# Patient Record
Sex: Male | Born: 1945 | Race: White | Hispanic: No | Smoking: Former smoker
Health system: Southern US, Community
[De-identification: ages and names within clinical notes are randomized; demographics above are authoritative.]

## PROBLEM LIST (undated history)

## (undated) DIAGNOSIS — N35919 Unspecified urethral stricture, male, unspecified site: Secondary | ICD-10-CM

## (undated) DIAGNOSIS — J439 Emphysema, unspecified: Secondary | ICD-10-CM

## (undated) DIAGNOSIS — I77810 Thoracic aortic ectasia: Secondary | ICD-10-CM

## (undated) DIAGNOSIS — N529 Male erectile dysfunction, unspecified: Secondary | ICD-10-CM

## (undated) DIAGNOSIS — N486 Induration penis plastica: Secondary | ICD-10-CM

## (undated) DIAGNOSIS — Z972 Presence of dental prosthetic device (complete) (partial): Secondary | ICD-10-CM

## (undated) DIAGNOSIS — R310 Gross hematuria: Secondary | ICD-10-CM

## (undated) DIAGNOSIS — Z86007 Personal history of in-situ neoplasm of skin: Secondary | ICD-10-CM

## (undated) DIAGNOSIS — K08109 Complete loss of teeth, unspecified cause, unspecified class: Secondary | ICD-10-CM

## (undated) DIAGNOSIS — M25562 Pain in left knee: Secondary | ICD-10-CM

## (undated) DIAGNOSIS — M545 Other chronic pain: Secondary | ICD-10-CM

## (undated) DIAGNOSIS — G8929 Other chronic pain: Secondary | ICD-10-CM

## (undated) DIAGNOSIS — Z973 Presence of spectacles and contact lenses: Secondary | ICD-10-CM

## (undated) DIAGNOSIS — R918 Other nonspecific abnormal finding of lung field: Secondary | ICD-10-CM

## (undated) DIAGNOSIS — I7121 Aneurysm of the ascending aorta, without rupture: Secondary | ICD-10-CM

## (undated) DIAGNOSIS — I712 Thoracic aortic aneurysm, without rupture: Secondary | ICD-10-CM

## (undated) DIAGNOSIS — N401 Enlarged prostate with lower urinary tract symptoms: Secondary | ICD-10-CM

## (undated) DIAGNOSIS — E291 Testicular hypofunction: Secondary | ICD-10-CM

## (undated) DIAGNOSIS — R3915 Urgency of urination: Secondary | ICD-10-CM

## (undated) DIAGNOSIS — M25561 Pain in right knee: Secondary | ICD-10-CM

## (undated) DIAGNOSIS — M1990 Unspecified osteoarthritis, unspecified site: Secondary | ICD-10-CM

## (undated) HISTORY — PX: TONSILLECTOMY: SUR1361

## (undated) HISTORY — DX: Thoracic aortic ectasia: I77.810

---

## 1986-12-18 HISTORY — PX: OTHER SURGICAL HISTORY: SHX169

## 2002-12-18 HISTORY — PX: SHOULDER ARTHROSCOPY WITH ROTATOR CUFF REPAIR AND SUBACROMIAL DECOMPRESSION: SHX5686

## 2003-02-09 ENCOUNTER — Ambulatory Visit (HOSPITAL_BASED_OUTPATIENT_CLINIC_OR_DEPARTMENT_OTHER): Admission: RE | Admit: 2003-02-09 | Discharge: 2003-02-09 | Payer: Self-pay | Admitting: Orthopedic Surgery

## 2005-12-14 ENCOUNTER — Ambulatory Visit: Payer: Self-pay | Admitting: Pulmonary Disease

## 2009-06-25 ENCOUNTER — Encounter: Payer: Self-pay | Admitting: Pulmonary Disease

## 2011-01-17 NOTE — Letter (Signed)
Summary: Chronic Neck Pain/[facility]  Chronic Neck Pain/[facility]   Imported By: Sherian Rein 07/14/2009 10:07:23  _____________________________________________________________________  External Attachment:    Type:   Image     Comment:   External Document

## 2011-12-09 ENCOUNTER — Ambulatory Visit (INDEPENDENT_AMBULATORY_CARE_PROVIDER_SITE_OTHER): Payer: 59

## 2011-12-09 DIAGNOSIS — R05 Cough: Secondary | ICD-10-CM

## 2011-12-09 DIAGNOSIS — R059 Cough, unspecified: Secondary | ICD-10-CM

## 2011-12-09 DIAGNOSIS — J159 Unspecified bacterial pneumonia: Secondary | ICD-10-CM

## 2011-12-09 DIAGNOSIS — R0602 Shortness of breath: Secondary | ICD-10-CM

## 2011-12-09 IMAGING — CR DG CHEST 2V
2 series · 2 of 2 positions shown · non-contrast
Comparison: none

[PA]
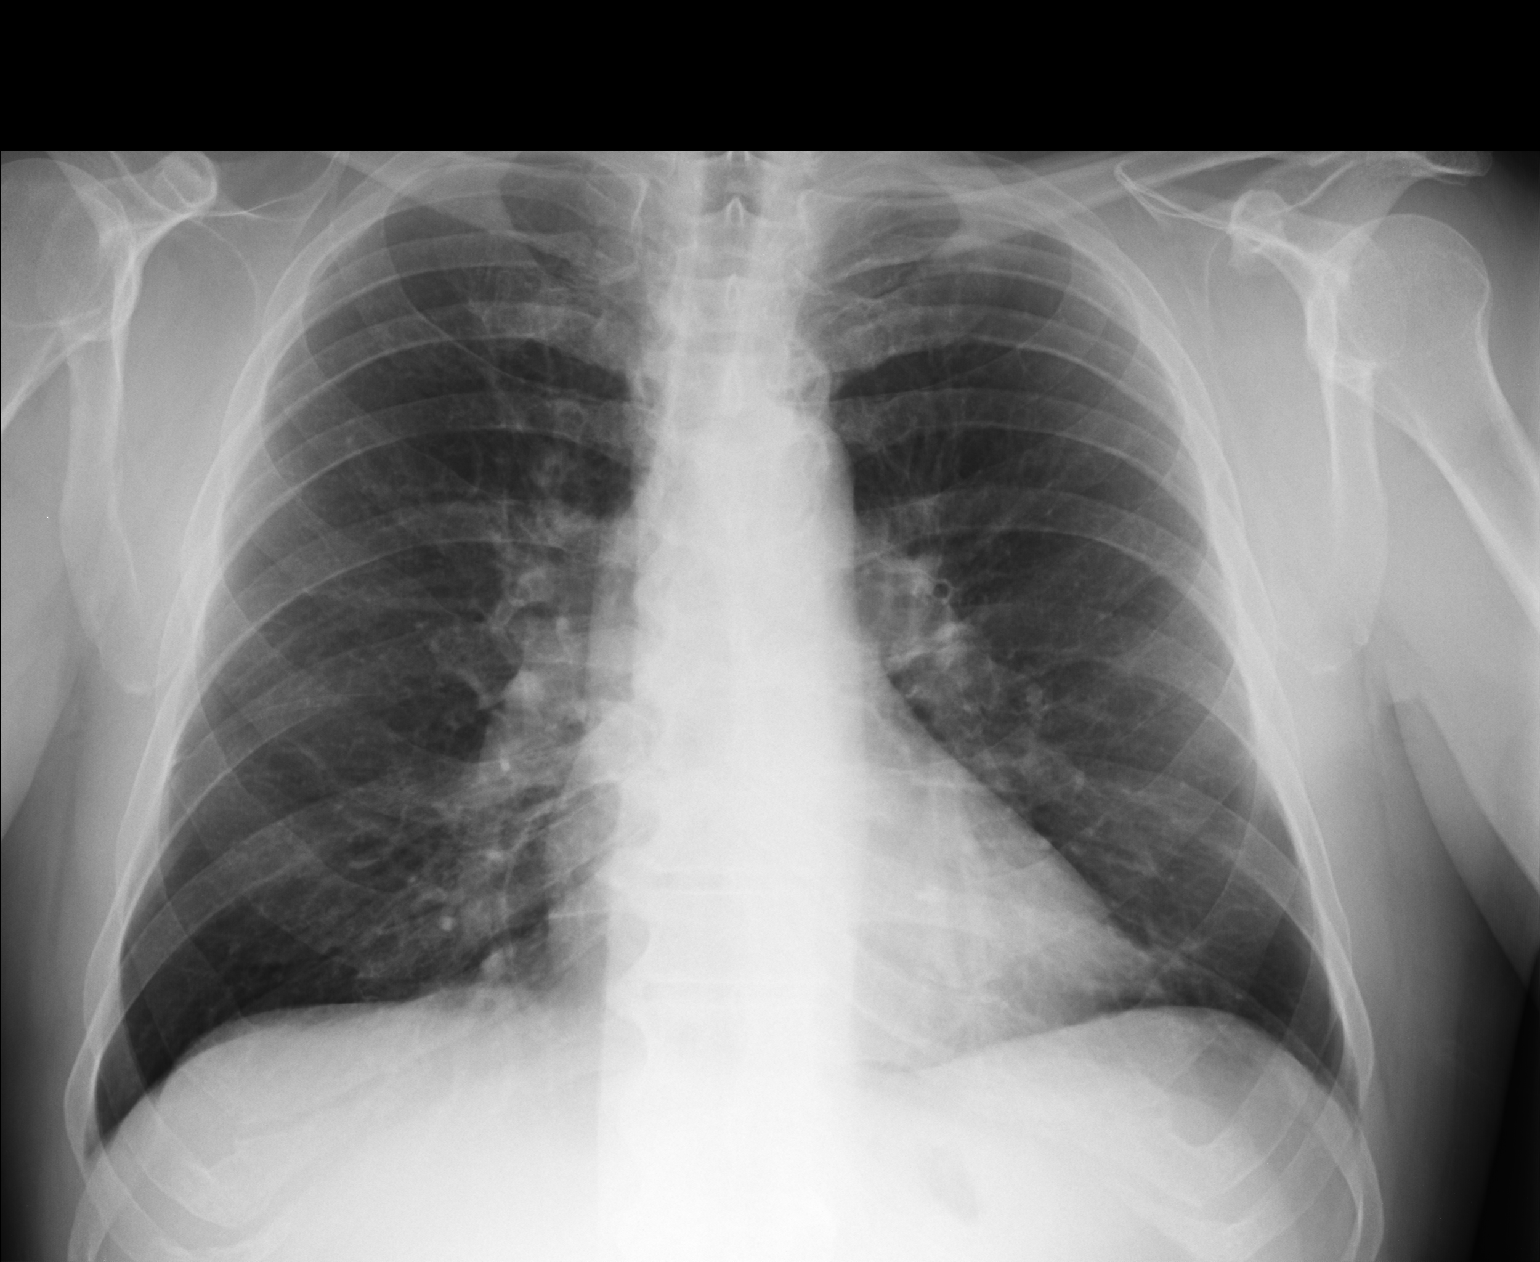

[lateral]
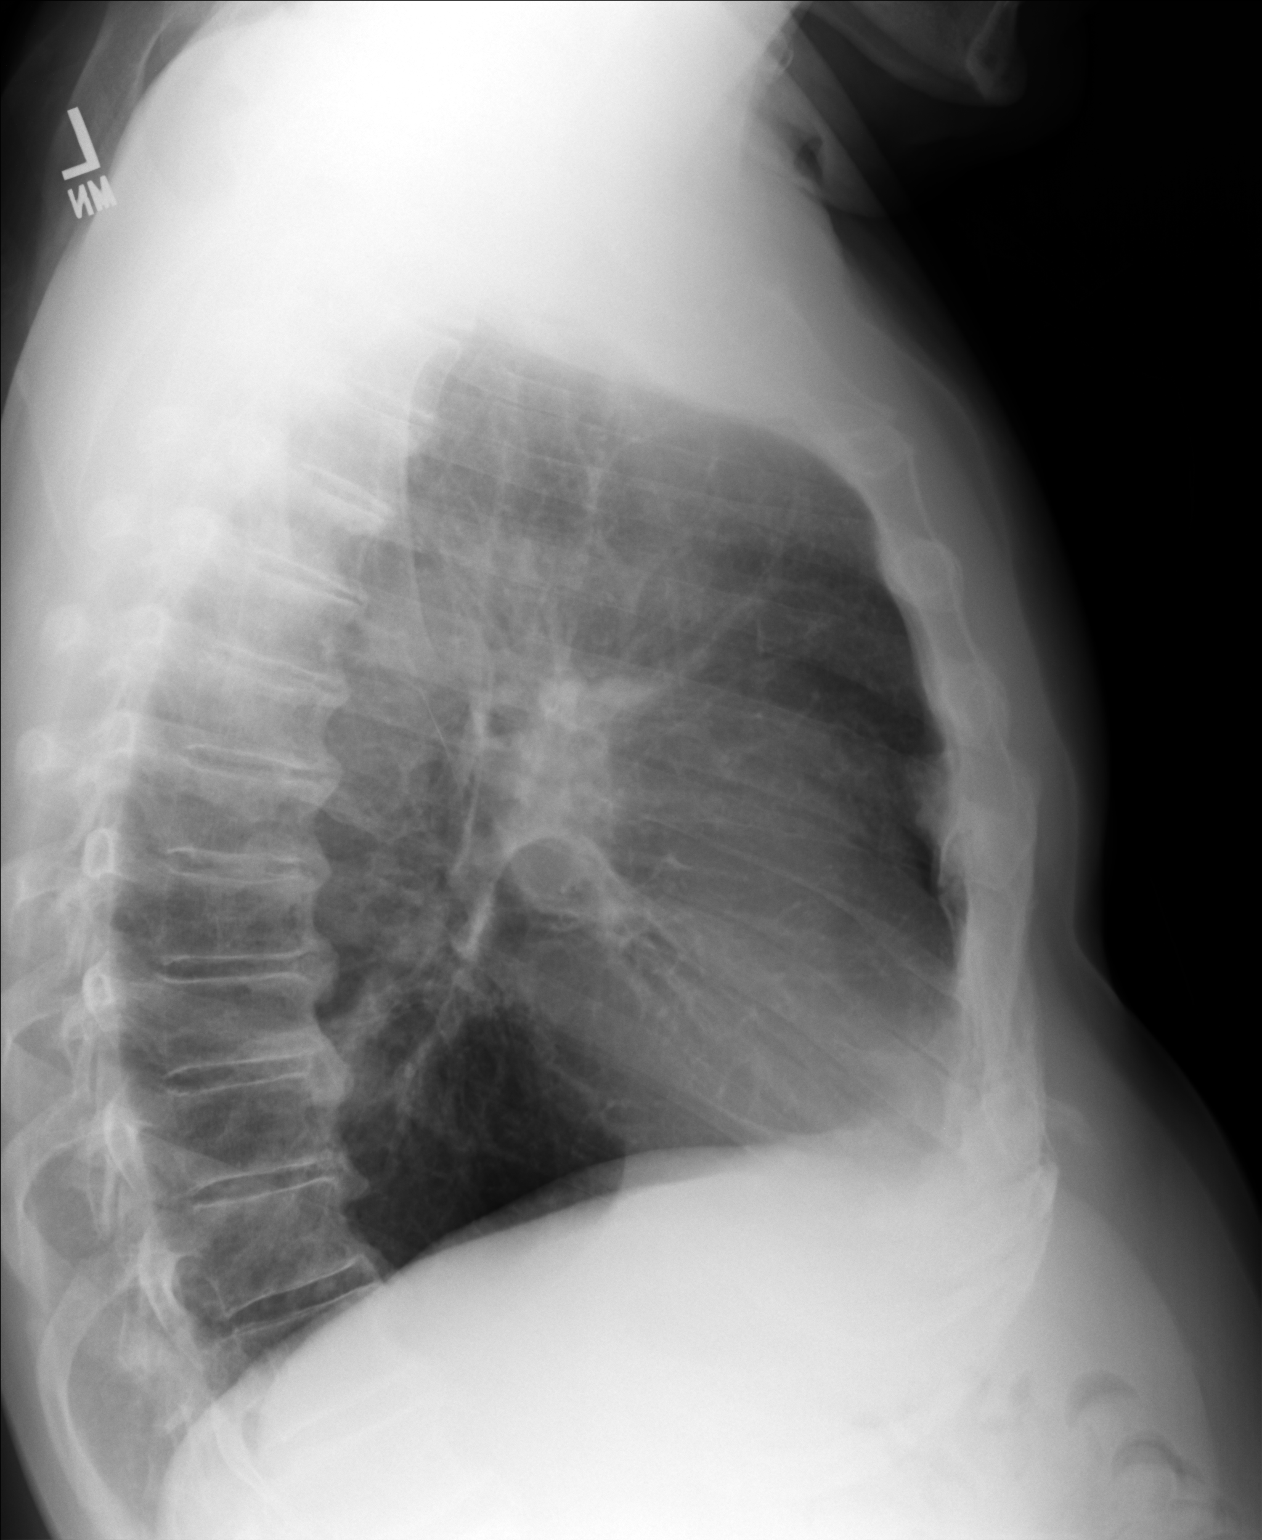

[2 of 2 positions shown; findings below may reference images not displayed]

Canned report from images found in remote index.

Refer to host system for actual result text.

## 2011-12-22 ENCOUNTER — Ambulatory Visit (INDEPENDENT_AMBULATORY_CARE_PROVIDER_SITE_OTHER): Payer: 59

## 2011-12-22 DIAGNOSIS — R0602 Shortness of breath: Secondary | ICD-10-CM

## 2011-12-22 DIAGNOSIS — J9801 Acute bronchospasm: Secondary | ICD-10-CM

## 2012-01-24 ENCOUNTER — Ambulatory Visit (INDEPENDENT_AMBULATORY_CARE_PROVIDER_SITE_OTHER): Payer: 59 | Admitting: Family Medicine

## 2012-01-24 VITALS — BP 148/78 | HR 99 | Temp 98.1°F | Resp 18 | Ht 66.5 in | Wt 183.0 lb

## 2012-01-24 DIAGNOSIS — R059 Cough, unspecified: Secondary | ICD-10-CM

## 2012-01-24 DIAGNOSIS — J449 Chronic obstructive pulmonary disease, unspecified: Secondary | ICD-10-CM

## 2012-01-24 DIAGNOSIS — R05 Cough: Secondary | ICD-10-CM

## 2012-01-24 LAB — POCT CBC
Granulocyte percent: 59.1 %G (ref 37–80)
HCT, POC: 46.5 % (ref 43.5–53.7)
Hemoglobin: 15 g/dL (ref 14.1–18.1)
Lymph, poc: 2.7 (ref 0.6–3.4)
MCH, POC: 31.3 pg — AB (ref 27–31.2)
MCHC: 32.3 g/dL (ref 31.8–35.4)
MCV: 97 fL (ref 80–97)
MID (cbc): 0.7 (ref 0–0.9)
MPV: 7.3 fL (ref 0–99.8)
POC Granulocyte: 4.9 (ref 2–6.9)
POC LYMPH PERCENT: 32.2 %L (ref 10–50)
POC MID %: 8.7 %M (ref 0–12)
Platelet Count, POC: 321 10*3/uL (ref 142–424)
RBC: 4.79 M/uL (ref 4.69–6.13)
RDW, POC: 14.3 %
WBC: 8.3 10*3/uL (ref 4.6–10.2)

## 2012-01-24 MED ORDER — ALBUTEROL SULFATE (2.5 MG/3ML) 0.083% IN NEBU
2.5000 mg | INHALATION_SOLUTION | Freq: Once | RESPIRATORY_TRACT | Status: AC
  Administered 2012-01-24: 2.5 mg via RESPIRATORY_TRACT

## 2012-01-24 MED ORDER — TIOTROPIUM BROMIDE MONOHYDRATE 18 MCG IN CAPS: 18.0000 ug | ORAL_CAPSULE | Freq: Every day | RESPIRATORY_TRACT | Status: DC

## 2012-01-24 MED ORDER — IPRATROPIUM BROMIDE 0.02 % IN SOLN
0.5000 mg | Freq: Once | RESPIRATORY_TRACT | Status: AC
  Administered 2012-01-24: 0.5 mg via RESPIRATORY_TRACT

## 2012-01-24 MED ORDER — PREDNISONE 20 MG PO TABS: ORAL_TABLET | ORAL | Status: DC

## 2012-01-24 MED ORDER — MOMETASONE FURO-FORMOTEROL FUM 200-5 MCG/ACT IN AERO: 2.0000 | INHALATION_SPRAY | Freq: Two times a day (BID) | RESPIRATORY_TRACT | Status: DC

## 2012-01-24 NOTE — Progress Notes (Signed)
  Subjective:    Patient ID: Kent Flowers, male    DOB: 01/19/1946, 66 y.o.   MRN: [id number]  HPI Kent Flowers presents today c/o worsening SOB.  He was seen 2 times 12/12 for COPD flare.  He never fully recovered to his baseline after the last treatment with prednisone, albuterol and qvar.  Kent Flowers mentions that prior to the December events he was very active and had good stamina but feels he's never returned to that level.  He becomes SOB when exerting himself but feels fine at rest.   Kent Flowers has had several flares in the past 10 years that were much more mild than his current symptoms but has never been told he has COPD.  He would use Primatene with some relief but was short lived. He last smoked 1 1/2 years ago and was an on/off smoker that smoked 1 ppd.    Review of Systems  Constitutional: Positive for chills and fatigue. Negative for fever.  HENT: Negative for congestion and sore throat.   Respiratory: Positive for cough, shortness of breath and wheezing. Negative for chest tightness.   Cardiovascular: Negative for chest pain and palpitations.  Musculoskeletal: Negative for myalgias.  Neurological: Positive for weakness.       Objective:   Physical Exam  Constitutional: He appears well-developed and well-nourished. No distress.  HENT:  Right Ear: Tympanic membrane normal.  Left Ear: Tympanic membrane normal.  Nose: Nose normal.  Mouth/Throat: Oropharynx is clear and moist.  Neck: No thyromegaly present.  Cardiovascular: Normal rate and regular rhythm.   Pulmonary/Chest: No accessory muscle usage or stridor. Not tachypneic. No respiratory distress. He has wheezes in the right lower field and the left lower field. He has rhonchi. He has no rales.  Skin: Skin is warm.    Albuterol/Atrovent Neb given with relief  Spirometry performed pre/post neb  Results for orders placed in visit on 01/24/12  POCT CBC      Component Value Range   WBC 8.3  4.6 - 10.2 (K/uL)   Lymph, poc 2.7  0.6 - 3.4    POC LYMPH PERCENT 32.2  10 - 50 (%L)   MID (cbc) 0.7  0 - 0.9    POC MID % 8.7  0 - 12 (%M)   POC Granulocyte 4.9  2 - 6.9    Granulocyte percent 59.1  37 - 80 (%G)   RBC 4.79  4.69 - 6.13 (M/uL)   Hemoglobin 15.0  14.1 - 18.1 (g/dL)   HCT, POC 40.9  81.1 - 53.7 (%)   MCV 97.0  80 - 97 (fL)   MCH, POC 31.3 (*) 27 - 31.2 (pg)   MCHC 32.3  31.8 - 35.4 (g/dL)   RDW, POC 91.4     Platelet Count, POC 321  142 - 424 (K/uL)   MPV 7.3  0 - 99.8 (fL)          Assessment & Plan:   1. COPD (chronic obstructive pulmonary disease)    2. Cough  POCT CBC, Comprehensive metabolic panel, albuterol (PROVENTIL) (2.5 MG/3ML) 0.083% nebulizer solution 2.5 mg, ipratropium (ATROVENT) nebulizer solution 0.5 mg, predniSONE (DELTASONE) 20 MG tablet    Start Prednisone today Dulera and Spiriva  Referral to Pulmonolgy Use Albuterol temporarily as rescue.  Return if SOB worsens Call tomorrow with status.

## 2012-01-24 NOTE — Patient Instructions (Signed)
Take Dulera 2 puffs every 12 hours Use Spiriva once a day Take prednisone as directed If you haven't heard from [place] regarding your pulmonology referral by Monday, please contact us.   Chronic Obstructive Pulmonary Disease Chronic obstructive pulmonary disease (COPD) is a condition in which airflow from the lungs is restricted. The lungs can never return to normal, but there are measures you can take which will improve them and make you feel better. CAUSES   Smoking.   Exposure to secondhand smoke.   Breathing in irritants (pollution, cigarette smoke, strong smells, aerosol sprays, paint fumes).   History of lung infections.  TREATMENT  Treatment focuses on making you comfortable (supportive care). Your caregiver may prescribe medications (inhaled or pills) to help improve your breathing. HOME CARE INSTRUCTIONS   If you smoke, stop smoking.   Avoid exposure to smoke, chemicals, and fumes that aggravate your breathing.   Take antibiotic medicines as directed by your caregiver.   Avoid medicines that dry up your system and slow down the elimination of secretions (antihistamines and cough syrups). This decreases respiratory capacity and may lead to infections.   Drink enough water and fluids to keep your urine clear or pale yellow. This loosens secretions.   Use humidifiers at home and at your bedside if they do not make breathing difficult.   Receive all protective vaccines your caregiver suggests, especially pneumococcal and influenza.   Use home oxygen as suggested.   Stay active. Exercise and physical activity will help maintain your ability to do things you want to do.   Eat a healthy diet.  SEEK MEDICAL CARE IF:   You develop pus-like mucus (sputum).   Breathing is more labored or exercise becomes difficult to do.   You are running out of the medicine you take for your breathing.  SEEK IMMEDIATE MEDICAL CARE IF:   You have a rapid heart rate.   You have agitation,  confusion, tremors, or are in a stupor (family members may need to observe this).   It becomes difficult to breathe.   You develop chest pain.   You have a fever.  MAKE SURE YOU:   Understand these instructions.   Will watch your condition.   Will get help right away if you are not doing well or get worse.  Document Released: 09/13/2005 Document Revised: 08/16/2011 Document Reviewed: 02/03/2011 [facility] Patient Information 2012 [place], [place].

## 2012-01-25 LAB — COMPREHENSIVE METABOLIC PANEL
ALT: 29 U/L (ref 0–53)
AST: 25 U/L (ref 0–37)
Albumin: 4.7 g/dL (ref 3.5–5.2)
Alkaline Phosphatase: 83 U/L (ref 39–117)
BUN: 24 mg/dL — ABNORMAL HIGH (ref 6–23)
CO2: 29 mEq/L (ref 19–32)
Calcium: 9.9 mg/dL (ref 8.4–10.5)
Chloride: 100 mEq/L (ref 96–112)
Creat: 1.12 mg/dL (ref 0.50–1.35)
Glucose, Bld: 96 mg/dL (ref 70–99)
Potassium: 4.5 mEq/L (ref 3.5–5.3)
Sodium: 139 mEq/L (ref 135–145)
Total Bilirubin: 0.5 mg/dL (ref 0.3–1.2)
Total Protein: 7.1 g/dL (ref 6.0–8.3)

## 2012-01-31 ENCOUNTER — Encounter: Payer: Self-pay | Admitting: Family Medicine

## 2012-02-01 NOTE — Progress Notes (Signed)
Quick Note:  Please let patient know cmet is normal. Thanks! KR  ______

## 2012-02-20 ENCOUNTER — Telehealth: Payer: Self-pay

## 2012-02-20 ENCOUNTER — Encounter: Payer: Self-pay | Admitting: Internal Medicine

## 2012-02-20 ENCOUNTER — Ambulatory Visit (INDEPENDENT_AMBULATORY_CARE_PROVIDER_SITE_OTHER): Payer: 59 | Admitting: Internal Medicine

## 2012-02-20 VITALS — BP 144/82 | HR 88 | Ht 66.5 in | Wt 194.2 lb

## 2012-02-20 DIAGNOSIS — R0989 Other specified symptoms and signs involving the circulatory and respiratory systems: Secondary | ICD-10-CM

## 2012-02-20 DIAGNOSIS — R0600 Dyspnea, unspecified: Secondary | ICD-10-CM

## 2012-02-20 DIAGNOSIS — R0609 Other forms of dyspnea: Secondary | ICD-10-CM

## 2012-02-20 DIAGNOSIS — J449 Chronic obstructive pulmonary disease, unspecified: Secondary | ICD-10-CM

## 2012-02-20 NOTE — Patient Instructions (Signed)
Continue Dulera   2 puffs, then rinse mouth, twice daily. Put it out on your sink and use it before brushing teeth every day.  Order- schedule PFT and 6 MWT    Dx dyspnea

## 2012-02-20 NOTE — Progress Notes (Signed)
02/20/12- [name] former smoker referred by Kennedy Bucker, PA-C/ [facility] because of COPD. Spirometry 01/24/12- FVC 2.24/ 65%, FEV1 1.20/ 42%, FEV1/FVC 0.53  He considers himself normally an active and athletic person. In December of 2012 he became aware of shortness of breath walking 100 yards. This is either new or not noticed previously. He had some prednisone left over from an orthopedic visit so he tried that with no effect. In January he went to urgent care where he was diagnosed with pneumonia and treated with Levaquin x10 days with no effect. He denies fever, cough or phlegm. He was given a repeat prednisone taper from 60 mg which seemed to help modestly. Prednisone caused weight gain and nervousness but did seem to make a big difference after a third round, which ended 3 weeks ago.Marland Kitchen He says he is now breathing "phenomenally better". He is still not back to normal exercise tolerance. He notices wheeze when he is short of breath with exertion. He was given Spiriva but decided not to try until he was seen here because he has a history of urinary outlet obstruction. He was also given a sample of Dulera but uncertain effect. For 4 or 5 years he has used a Primatene inhaler for morning shortness of breath occasionally. He did not find a rescue albuterol inhaler as effective. He doesn't know about past history of pneumonia but denies TB exposure. There is no family history of DVT or pulmonary embolism. Mother was a smoker who died at age 19 of COPD. He denies personal history of heart disease or symptoms of chest pain or palpitation. 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    ROS-see HPI Constitutional:   No-   weight loss, night sweats, fevers, chills, fatigue, lassitude. HEENT:   No-  headaches, difficulty swallowing, tooth/dental problems, sore throat,       No-  sneezing, itching, ear ache, nasal congestion, post nasal drip,  CV:  No-   chest  pain, orthopnea, PND, swelling in lower extremities, anasarca,  dizziness, palpitations Resp: +   shortness of breath with exertion or at rest.              No-   productive cough,  No non-productive cough,  No- coughing up of blood.              No-   change in color of mucus.  No- wheezing.   Skin: No-   rash or lesions. GI:  No-   heartburn, indigestion, abdominal pain, nausea, vomiting GU: No-   dysuria,. MS:  +  joint pain or swelling.  + decreased range of motion.  +- back pain. Neuro-     nothing unusual Psych:  No- change in mood or affect. No depression or anxiety.  No memory loss.  OBJ- Physical Exam General- Alert, Oriented, Affect-appropriate, Distress- none acute Skin- rash-none, lesions- none, excoriation- none Lymphadenopathy- none Head- atraumatic            Eyes- Gross vision intact, PERRLA, conjunctivae and secretions clear            Ears- Hearing, canals-normal            Nose- Clear, no-Septal dev, mucus, polyps, erosion, perforation             Throat- Mallampati II , mucosa clear , drainage- none, tonsils- atrophic Neck- flexible , trachea midline, no stridor , thyroid nl, carotid no bruit  Chest - symmetrical excursion , unlabored           Heart/CV- RRR , no murmur , no gallop  , no rub, nl s1 s2                           - JVD- none , edema- none, stasis changes- none, varices- none           Lung- clear to P&A, coarse wheeze R mid back, cough- none , dullness-none, rub- none           Chest wall-  Abd- tender-no, distended-no, bowel sounds-present, HSM- no Br/ Gen/ Rectal- Not done, not indicated Extrem- cyanosis- none, clubbing, none, atrophy- none, strength- nl. Neg [facility] Neuro- grossly intact to observation  Her

## 2012-02-20 NOTE — Telephone Encounter (Signed)
Xray reports from 12/09/11 and 12/22/11 faxed to Dr. Jetty Duhamel.

## 2012-02-20 NOTE — Telephone Encounter (Signed)
X ray report needed now  Dr. Maple Hudson is with the patient now Fax [phone]

## 2012-02-24 DIAGNOSIS — J449 Chronic obstructive pulmonary disease, unspecified: Secondary | ICD-10-CM | POA: Insufficient documentation

## 2012-02-24 DIAGNOSIS — J441 Chronic obstructive pulmonary disease with (acute) exacerbation: Secondary | ICD-10-CM | POA: Insufficient documentation

## 2012-02-24 NOTE — Assessment & Plan Note (Addendum)
I hear more wheeze in right mid back. Chest x-rays did not show lateralizing abnormality. This needs to be watched for possibility of an endobronchial partial obstruction. Consider need for CT scan in the future. Pneumococcal vaccine discussed. Formal PFT scheduled. Instructed use of Dulera.

## 2012-03-08 ENCOUNTER — Ambulatory Visit: Payer: 59

## 2012-03-19 ENCOUNTER — Ambulatory Visit (INDEPENDENT_AMBULATORY_CARE_PROVIDER_SITE_OTHER): Payer: 59 | Admitting: Internal Medicine

## 2012-03-19 DIAGNOSIS — R0989 Other specified symptoms and signs involving the circulatory and respiratory systems: Secondary | ICD-10-CM

## 2012-03-19 DIAGNOSIS — R0600 Dyspnea, unspecified: Secondary | ICD-10-CM

## 2012-03-19 DIAGNOSIS — R0609 Other forms of dyspnea: Secondary | ICD-10-CM

## 2012-03-19 DIAGNOSIS — J449 Chronic obstructive pulmonary disease, unspecified: Secondary | ICD-10-CM

## 2012-03-19 LAB — PULMONARY FUNCTION TEST

## 2012-03-19 NOTE — Progress Notes (Signed)
PFT done today. 

## 2012-03-22 ENCOUNTER — Ambulatory Visit: Payer: 59 | Admitting: Internal Medicine

## 2012-03-27 ENCOUNTER — Encounter: Payer: Self-pay | Admitting: Internal Medicine

## 2012-03-27 NOTE — Progress Notes (Signed)
Documentation for 6 minute walk test 

## 2012-03-29 ENCOUNTER — Encounter: Payer: Self-pay | Admitting: Internal Medicine

## 2012-03-29 ENCOUNTER — Ambulatory Visit (INDEPENDENT_AMBULATORY_CARE_PROVIDER_SITE_OTHER): Payer: 59 | Admitting: Internal Medicine

## 2012-03-29 VITALS — BP 136/86 | HR 76 | Ht 67.0 in | Wt 186.4 lb

## 2012-03-29 DIAGNOSIS — R0609 Other forms of dyspnea: Secondary | ICD-10-CM

## 2012-03-29 DIAGNOSIS — R0989 Other specified symptoms and signs involving the circulatory and respiratory systems: Secondary | ICD-10-CM

## 2012-03-29 DIAGNOSIS — J449 Chronic obstructive pulmonary disease, unspecified: Secondary | ICD-10-CM

## 2012-03-29 DIAGNOSIS — R0600 Dyspnea, unspecified: Secondary | ICD-10-CM

## 2012-03-29 NOTE — Progress Notes (Signed)
02/20/12- [name] former smoker referred by Kent Bucker, PA-C/ [facility] because of COPD. Spirometry 01/24/12- FVC 2.24/ 64%, FEV1 1.20/ 42%, FEV1/FVC 0.53  He considers himself normally an active and athletic person. In December of 2012 he became aware of shortness of breath walking 100 yards. This is either new or not noticed previously. He had some prednisone left over from an orthopedic visit so he tried that with no effect. In January he went to urgent care where he was diagnosed with pneumonia and treated with Levaquin x10 days with no effect. He denies fever, cough or phlegm. He was given a repeat prednisone taper from 60 mg which seemed to help modestly. Prednisone caused weight gain and nervousness but did seem to make a big difference after a third round, which ended 3 weeks ago.Marland Kitchen He says he is now breathing "phenomenally better". He is still not back to normal exercise tolerance. He notices wheeze when he is short of breath with exertion. He was given Spiriva but decided not to try until he was seen here because he has a history of urinary outlet obstruction. He was also given a sample of Dulera but uncertain effect. For 4 or 5 years he has used a Primatene inhaler for morning shortness of breath occasionally. He did not find a rescue albuterol inhaler as effective. He doesn't know about past history of pneumonia but denies TB exposure. There is no family history of DVT or pulmonary embolism. Mother was a smoker who died at age 66 of COPD. He denies personal history of heart disease or symptoms of chest pain or palpitation. 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    03/29/12-  [name] former smoker referred by Kent Bucker, PA-C/ [facility] because of COPD.  Pt states breathing has improved but not back at baseline yet.  Reports he does have DOE and prod cough with white phelgm mostly in the mornings. He hopes to get back to the exercise  tolerance he remembers from last fall when he says work included lifting 70 pound stones occasionally. He ran out of [facility], got worse and restarted it. PFT 03/19/2012 moderate obstructive airways disease with insignificant response to bronchodilator. FEV1/FVC 0.56. Air-trapping. Normal diffusion. 6 minute walk test 92%, 95%, 95%, 506 m. Good distance without oxygen limitation.    ROS-see HPI Constitutional:   No-   weight loss, night sweats, fevers, chills, fatigue, lassitude. HEENT:   No-  headaches, difficulty swallowing, tooth/dental problems, sore throat,       No-  sneezing, itching, ear ache, nasal congestion, post nasal drip,  CV:  No-   chest pain, orthopnea, PND, swelling in lower extremities, anasarca,  dizziness, palpitations Resp: +   shortness of breath with exertion or at rest.              No-   productive cough,  No non-productive cough,  No- coughing up of blood.              No-   change in color of mucus.  No- wheezing.   Skin: No-   rash or lesions. GI:  No-   heartburn, indigestion, abdominal pain, nausea, vomiting GU: No-   dysuria,. MS:  +  joint pain or swelling.  + decreased range of motion.  +- back pain. Neuro-     nothing unusual Psych:  No- change in mood or affect. No depression or anxiety.  No memory  loss.  OBJ- Physical Exam General- Alert, Oriented, Affect-appropriate, Distress- none acute, looks fit Skin- rash-none, lesions- none, excoriation- none. Sunburned Lymphadenopathy- none Head- atraumatic            Eyes- Gross vision intact, PERRLA, conjunctivae and secretions clear            Ears- Hearing, canals-normal            Nose- Clear, no-Septal dev, mucus, polyps, erosion, perforation             Throat- Mallampati II , mucosa clear , drainage- none, tonsils- atrophic Neck- flexible , trachea midline, no stridor , thyroid nl, carotid no bruit Chest - symmetrical excursion , unlabored           Heart/CV- RRR , no murmur , no gallop  , no rub, nl s1  s2                           - JVD- none , edema- none, stasis changes- none, varices- none           Lung- diminished, mild bilateral wheeze, cough- none , dullness-none, rub- none, raspy laughter            Chest wall-  Abd-  Br/ Gen/ Rectal- Not done, not indicated Extrem- cyanosis- none, clubbing, none, atrophy- none, strength- nl.  Neuro- grossly intact to observation  Her

## 2012-03-29 NOTE — Patient Instructions (Signed)
Order- CT angiogram chest   Dyspnea, question PE  Order EKG   Dx dspnea  Ok to try your spiriva, once daily, in addition to your [facility]

## 2012-04-04 NOTE — Assessment & Plan Note (Signed)
Moderate COPD. I can't tell what would have changed if he is correct that there was a drop off in exercise tolerance last fall. 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 Okay to experiment off of Dulera. Plan CT chest, EKG, try Spiriva.

## 2012-04-09 ENCOUNTER — Inpatient Hospital Stay: Admission: RE | Admit: 2012-04-09 | Payer: 59 | Source: Ambulatory Visit

## 2012-04-12 ENCOUNTER — Inpatient Hospital Stay: Admission: RE | Admit: 2012-04-12 | Payer: 59 | Source: Ambulatory Visit

## 2012-04-23 ENCOUNTER — Ambulatory Visit (INDEPENDENT_AMBULATORY_CARE_PROVIDER_SITE_OTHER)
Admission: RE | Admit: 2012-04-23 | Discharge: 2012-04-23 | Disposition: A | Discharge status: Home / Self Care | Payer: 59 | Source: Ambulatory Visit | Attending: Internal Medicine | Admitting: Internal Medicine

## 2012-04-23 DIAGNOSIS — R0609 Other forms of dyspnea: Secondary | ICD-10-CM

## 2012-04-23 DIAGNOSIS — R0600 Dyspnea, unspecified: Secondary | ICD-10-CM

## 2012-04-23 DIAGNOSIS — R0989 Other specified symptoms and signs involving the circulatory and respiratory systems: Secondary | ICD-10-CM

## 2012-04-23 IMAGING — CT CT ANGIO CHEST
2 of 6 series · 19 of 36 positions shown · IV contrast (Omnipaque 300)
Comparison: None

CLINICAL DATA: Question pulmonary embolism

CT ANGIOGRAPHY CHEST
TECHNIQUE: Multidetector CT imaging of the chest using the
standard protocol during bolus administration of intravenous
contrast. Multiplanar reconstructed images including MIPs were
obtained and reviewed to evaluate the vascular anatomy.
Contrast: 80mL OMNIPAQUE IOHEXOL 300 MG/ML  SOLN

[Series 5: thins (id) / (id) · axial · 0.75mm/px · z∈[-253,-27]mm · 18 of 252 slices shown]
[im 13/252  lung]
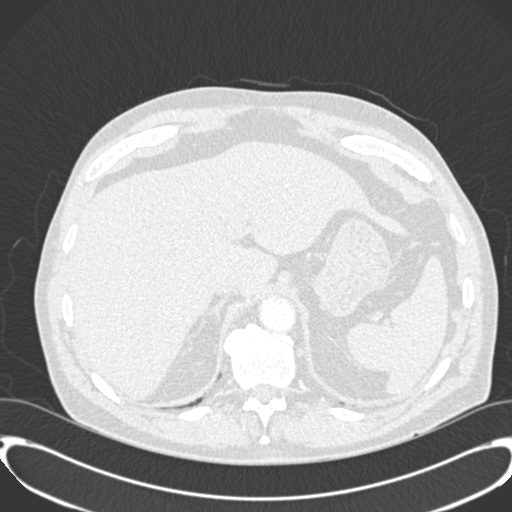
[im 26/252  mediastinal]
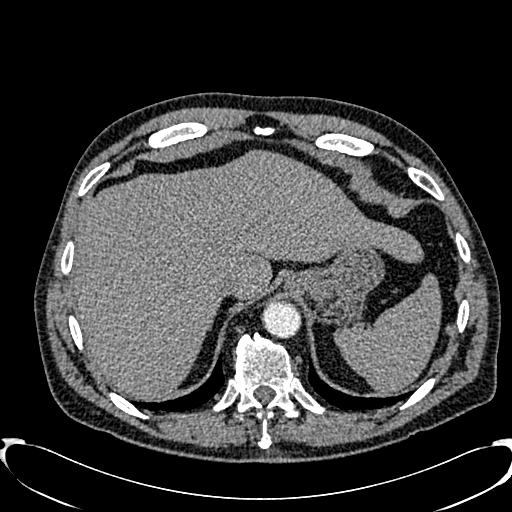
[im 38/252  lung]
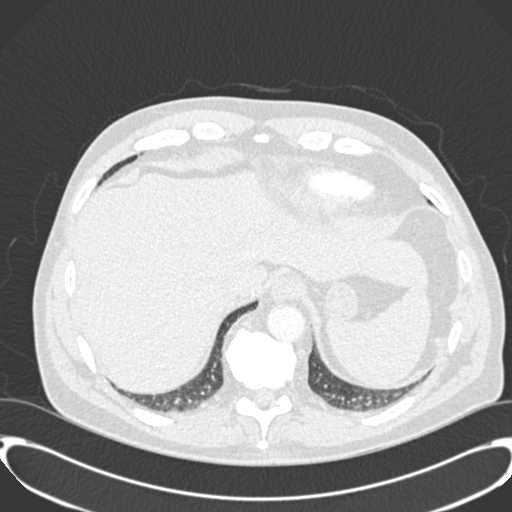
[im 51/252  mediastinal]
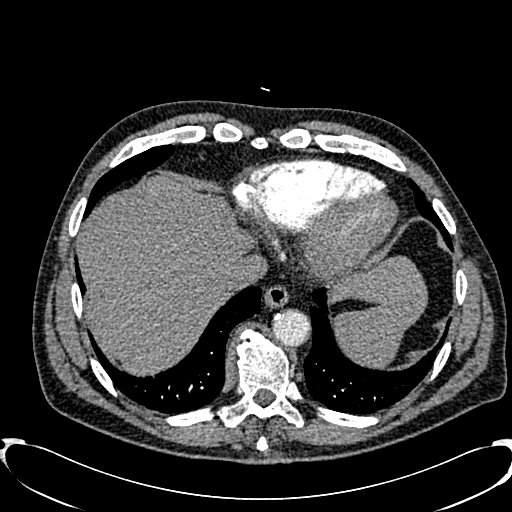
[im 63/252  lung]
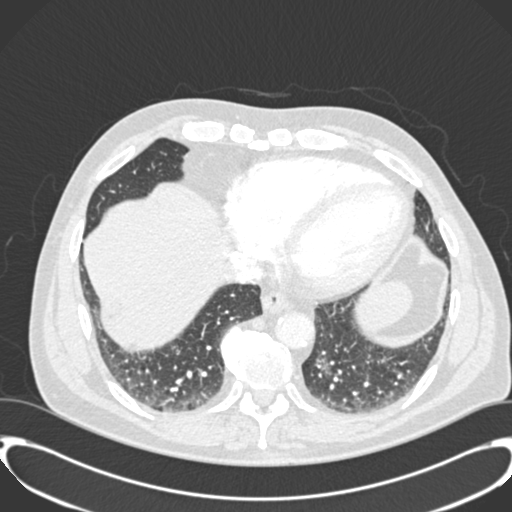
[im 76/252  mediastinal]
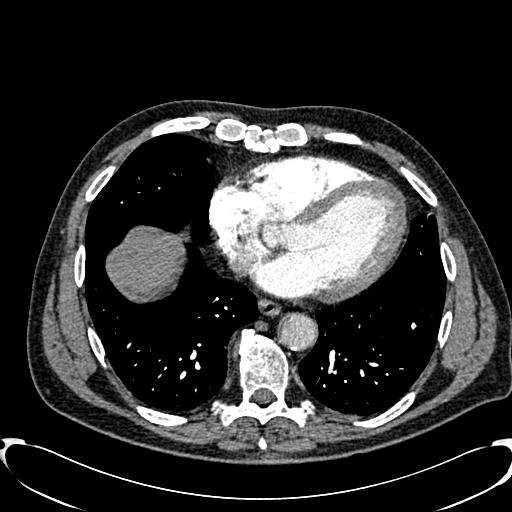
[im 88/252  lung]
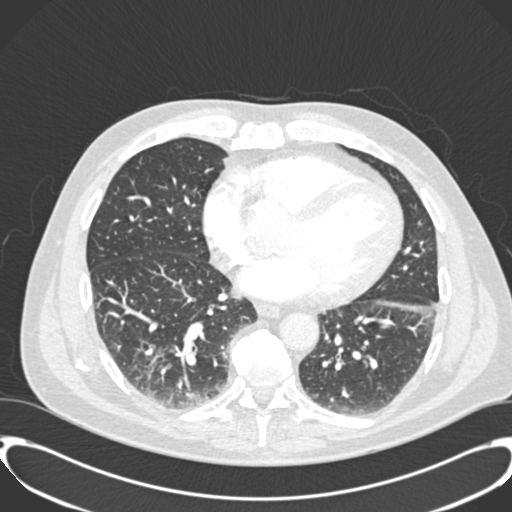
[im 101/252  mediastinal]
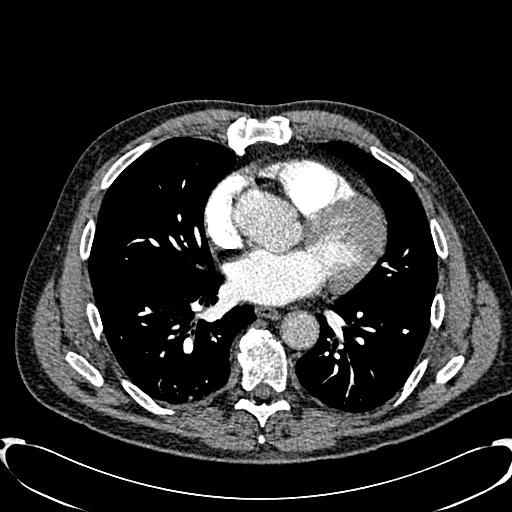
[im 113/252  lung]
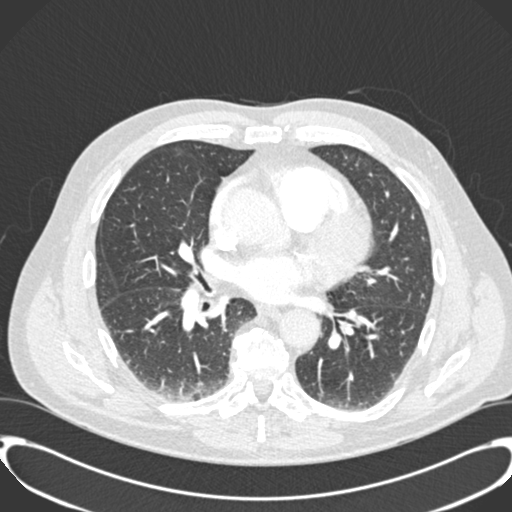
[im 139/252  mediastinal]
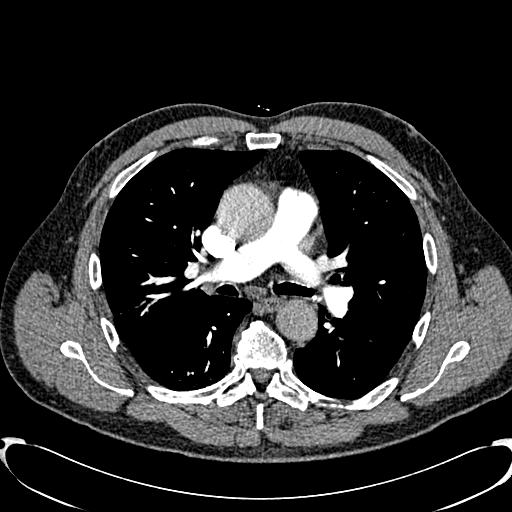
[im 151/252  lung]
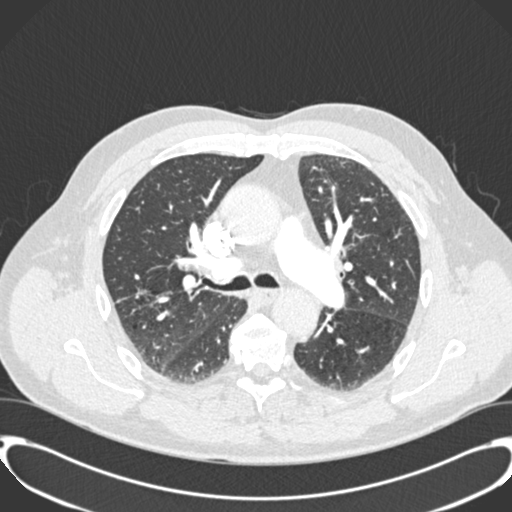
[im 164/252  mediastinal]
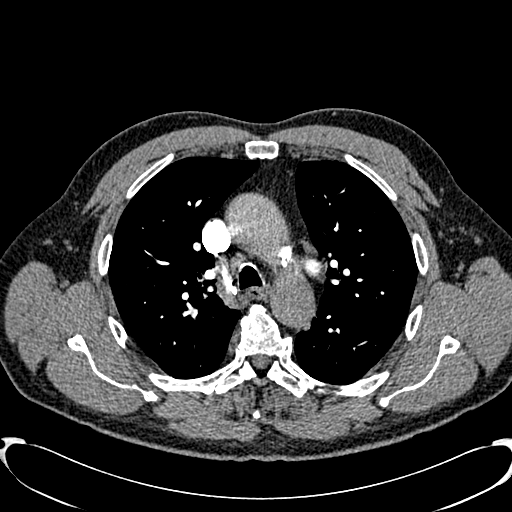
[im 176/252  lung]
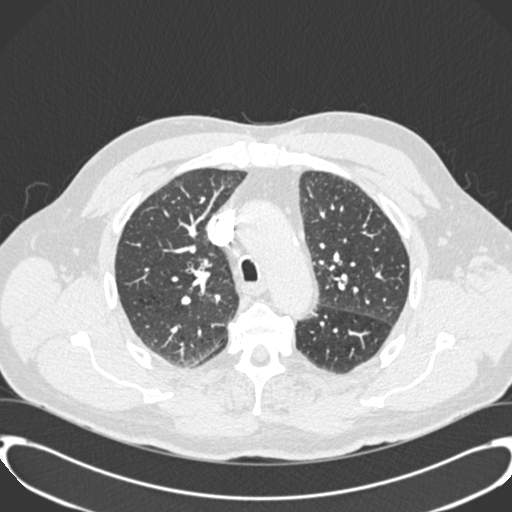
[im 189/252  mediastinal]
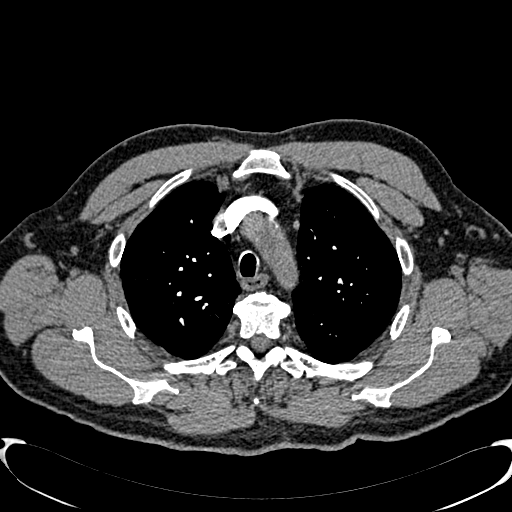
[im 201/252  lung]
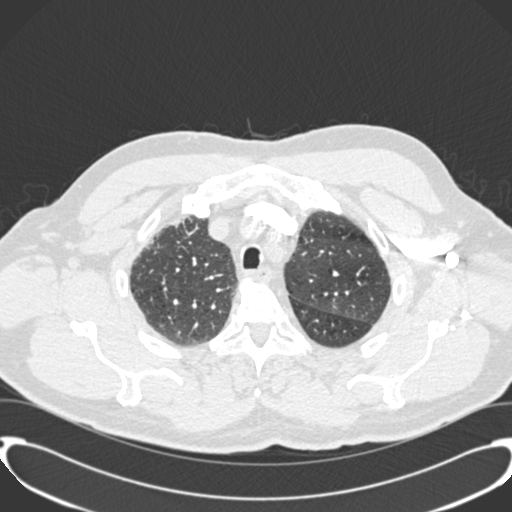
[im 214/252  mediastinal]
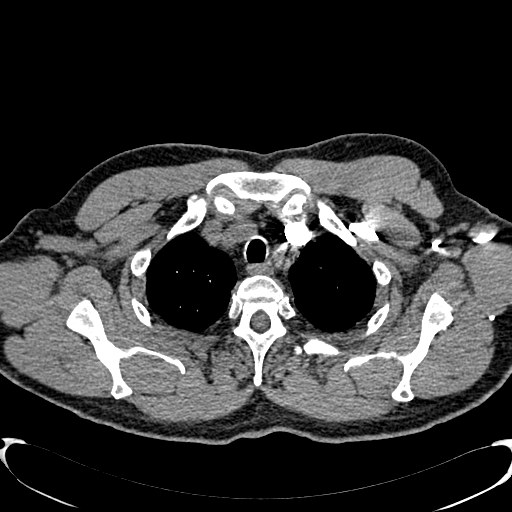
[im 226/252  lung]
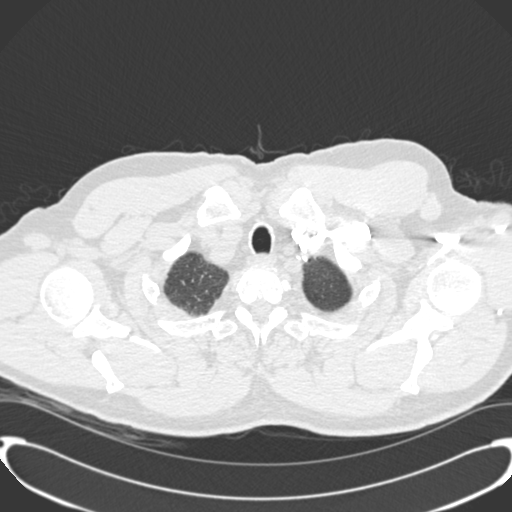
[im 239/252  mediastinal]
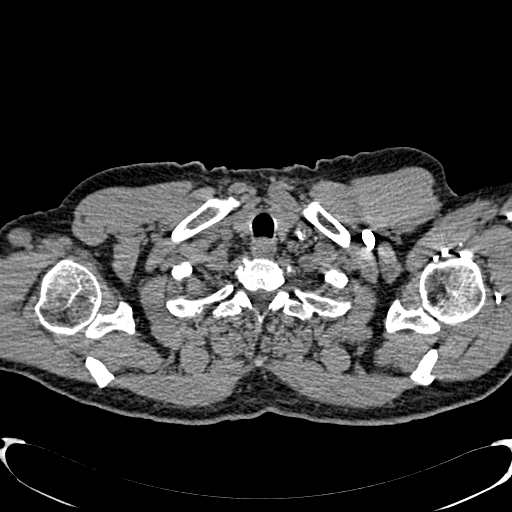

[Series 602: cor mpr · coronal · 0.75mm/px · 1 of 110 slices shown]
[im 55/110  mediastinal]
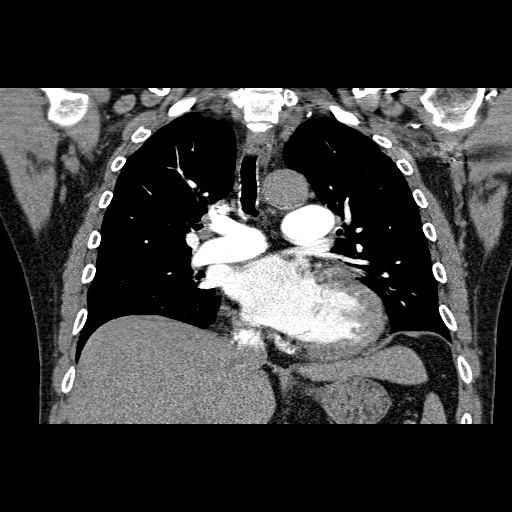

[19 of 36 positions shown; findings below may reference images not displayed]

FINDINGS: Aneurysmal dilatation ascending thoracic aorta 4.3 x 4.3 cm image
36.
Atherosclerotic calcifications in aorta and coronary arteries.
Visualized portion of upper abdomen normal appearance.
Scattered normal-sized thoracic lymph nodes.
Enlarged central pulmonary arteries question pulmonary arterial
hypertension.
Pulmonary arteries patent without evidence of pulmonary embolism.
Dependent atelectasis in both lungs.
Patchy central infiltrates in right upper and right lower lobes.
No evidence of pleural effusion.
Questionable stellate density 8 mm diameter versus scarring in
right middle lobe image 47.
Multilevel endplate spur formation thoracic spine.
IMPRESSION: No evidence of pulmonary embolism.
Aneurysmal dilatation ascending thoracic aorta 4.3 x 4.3 cm
greatest size.
Scattered atherosclerotic disease.
Question pulmonary arterial hypertension.
Minimal patchy central infiltrates in right upper right lower lobes
with a questionable 8 mm diameter stellate density versus scarring
in the right middle lobe.
If the patient has prior outside CT exams, recommend these be
obtained for comparison to establish stability.
In the absence of prior exams, recommend referral to the
[REDACTED] for management.

## 2012-04-23 MED ORDER — IOHEXOL 300 MG/ML  SOLN
80.0000 mL | Freq: Once | INTRAMUSCULAR | Status: AC | PRN
  Administered 2012-04-23: 80 mL via INTRAVENOUS

## 2012-04-26 NOTE — Progress Notes (Signed)
Quick Note:  LMTCB ______ 

## 2012-05-01 NOTE — Progress Notes (Signed)
Quick Note:  LMTCB ______ 

## 2012-05-02 NOTE — Progress Notes (Signed)
Quick Note:  If the patient has prior outside CT exams, recommend these be obtained for comparison to establish stability. In the absence of prior exams, recommend referral to the [facility] for management.  Pt aware of results and states he has never had a prior CT chest. Will forward to CY to advise. Pt is aware of his next OV on 05-17-12 with CY. ______

## 2012-05-14 ENCOUNTER — Ambulatory Visit: Payer: 59 | Admitting: Internal Medicine

## 2012-05-17 ENCOUNTER — Ambulatory Visit (INDEPENDENT_AMBULATORY_CARE_PROVIDER_SITE_OTHER): Payer: 59 | Admitting: Internal Medicine

## 2012-05-17 ENCOUNTER — Encounter: Payer: Self-pay | Admitting: Internal Medicine

## 2012-05-17 VITALS — BP 142/86 | HR 78 | Ht 67.0 in | Wt 191.2 lb

## 2012-05-17 DIAGNOSIS — J449 Chronic obstructive pulmonary disease, unspecified: Secondary | ICD-10-CM

## 2012-05-17 DIAGNOSIS — R911 Solitary pulmonary nodule: Secondary | ICD-10-CM

## 2012-05-17 NOTE — Progress Notes (Signed)
02/20/12- [name] former smoker referred by Kennedy Bucker, PA-C/ [facility] because of COPD. Spirometry 01/24/12- FVC 2.24/ 64%, FEV1 1.20/ 42%, FEV1/FVC 0.53  He considers himself normally an active and athletic person. In December of 2012 he became aware of shortness of breath walking 100 yards. This is either new or not noticed previously. He had some prednisone left over from an orthopedic visit so he tried that with no effect. In January he went to urgent care where he was diagnosed with pneumonia and treated with Levaquin x10 days with no effect. He denies fever, cough or phlegm. He was given a repeat prednisone taper from 60 mg which seemed to help modestly. Prednisone caused weight gain and nervousness but did seem to make a big difference after a third round, which ended 3 weeks ago.Kent Flowers He says he is now breathing "phenomenally better". He is still not back to normal exercise tolerance. He notices wheeze when he is short of breath with exertion. He was given Spiriva but decided not to try until he was seen here because he has a history of urinary outlet obstruction. He was also given a sample of Dulera but uncertain effect. For 4 or 5 years he has used a Primatene inhaler for morning shortness of breath occasionally. He did not find a rescue albuterol inhaler as effective. He doesn't know about past history of pneumonia but denies TB exposure. There is no family history of DVT or pulmonary embolism. Mother was a smoker who died at age 32 of COPD. He denies personal history of heart disease or symptoms of chest pain or palpitation. 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    05/18/15- [name] former smoker referred by Kennedy Bucker, PA-C/ [facility] because of COPD. Patient states is better. c/o sob with exertion, wheezing, and cough. Denies chest pain and chest tightness.  CAT COPD assessment test-8/40. Describes the morning cough with thick white  sputum. He tried off [place] but decided it helped him. He feels his shortness of breath is almost back to his baseline of last fall. CT chest 05/02/12- reviewed with him IMPRESSION:  No evidence of pulmonary embolism.  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  Scattered atherosclerotic disease.  Question pulmonary arterial hypertension.  Minimal patchy central infiltrates in right upper right lower lobes  with a questionable 8 mm diameter stellate density versus scarring  in the right middle lobe.  If the patient has prior outside CT exams, recommend these be  obtained for comparison to establish stability.  In the absence of prior exams, recommend referral to the  [facility] for management.  Original Report Authenticated By: Lollie Marrow, M.D.   ROS-see HPI Constitutional:   No-   weight loss, night sweats, fevers, chills, fatigue, lassitude. HEENT:   No-  headaches, difficulty swallowing, tooth/dental problems, sore throat,       No-  sneezing, itching, ear ache, nasal congestion, post nasal drip,  CV:  No-   chest pain, orthopnea, PND, swelling in lower extremities, anasarca,  dizziness, palpitations Resp: +   shortness of breath with exertion or at rest.              No-   productive cough,  No non-productive cough,  No- coughing up of blood.              No-   change in color of mucus.  No- wheezing.   Skin: No-   rash or lesions. GI:  No-   heartburn, indigestion, abdominal pain, nausea, vomiting GU: No-   dysuria,. MS:  +  joint pain or swelling.  + decreased range of motion.  +- back pain. Neuro-     nothing unusual Psych:  No- change in mood or affect. No depression or anxiety.  No memory loss.  OBJ- Physical Exam General- Alert, Oriented, Affect-appropriate, Distress- none acute Skin- rash-none, lesions- none, excoriation- none Lymphadenopathy- none Head- atraumatic            Eyes- Gross vision intact, PERRLA,  conjunctivae and secretions clear            Ears- Hearing, canals-normal            Nose- Clear, no-Septal dev, mucus, polyps, erosion, perforation             Throat- Mallampati II , mucosa clear , drainage- none, tonsils- atrophic Neck- flexible , trachea midline, no stridor , thyroid nl, carotid no bruit Chest - symmetrical excursion , unlabored           Heart/CV- RRR , no murmur , no gallop  , no rub, nl s1 s2                           - JVD- none , edema- none, stasis changes- none, varices- none           Lung- clear to P&A, coarse wheeze R mid back, cough- none , dullness-none, rub- none           Chest wall-  Abd- tender-no, distended-no, bowel sounds-present, HSM- no Br/ Gen/ Rectal- Not done, not indicated Extrem- cyanosis- none, clubbing, none, atrophy- none, strength- nl.  Neuro- grossly intact to observation

## 2012-05-17 NOTE — Patient Instructions (Addendum)
Sample Dulera 100     2 puffs and rinse twice daily    Try this instead of your current Dulera 200 and see if it does as well.   Order- CT chest, noncontrast, to be done in 4 months, before return visit here    Dx lung nodule

## 2012-05-23 DIAGNOSIS — R911 Solitary pulmonary nodule: Secondary | ICD-10-CM | POA: Insufficient documentation

## 2012-05-23 NOTE — Assessment & Plan Note (Signed)
Plan-follow up CT scan in 4 months with no contrast. I have reviewed the images with him. He may be a candidate for the lung nodule study.

## 2012-05-23 NOTE — Assessment & Plan Note (Signed)
Controlled. Plan-stay on Dulera.

## 2012-07-14 ENCOUNTER — Emergency Department (HOSPITAL_COMMUNITY)
Admission: EM | Admit: 2012-07-14 | Discharge: 2012-07-14 | Disposition: A | Discharge status: Home / Self Care | Payer: 59 | Attending: Emergency Medicine | Admitting: Emergency Medicine

## 2012-07-14 ENCOUNTER — Encounter (HOSPITAL_COMMUNITY): Payer: Self-pay | Admitting: *Deleted

## 2012-07-14 DIAGNOSIS — W5501XA Bitten by cat, initial encounter: Secondary | ICD-10-CM

## 2012-07-14 DIAGNOSIS — S61559A Open bite of unspecified wrist, initial encounter: Secondary | ICD-10-CM

## 2012-07-14 DIAGNOSIS — S61509A Unspecified open wound of unspecified wrist, initial encounter: Secondary | ICD-10-CM | POA: Insufficient documentation

## 2012-07-14 DIAGNOSIS — IMO0001 Reserved for inherently not codable concepts without codable children: Secondary | ICD-10-CM | POA: Insufficient documentation

## 2012-07-14 DIAGNOSIS — L02519 Cutaneous abscess of unspecified hand: Secondary | ICD-10-CM | POA: Insufficient documentation

## 2012-07-14 DIAGNOSIS — Z23 Encounter for immunization: Secondary | ICD-10-CM | POA: Insufficient documentation

## 2012-07-14 DIAGNOSIS — L0390 Cellulitis, unspecified: Secondary | ICD-10-CM

## 2012-07-14 MED ORDER — DOXYCYCLINE HYCLATE 100 MG PO TABS
100.0000 mg | ORAL_TABLET | Freq: Once | ORAL | Status: AC
  Administered 2012-07-14: 100 mg via ORAL
  Filled 2012-07-14: qty 1

## 2012-07-14 MED ORDER — TETANUS-DIPHTH-ACELL PERTUSSIS 5-2.5-18.5 LF-MCG/0.5 IM SUSP
0.5000 mL | Freq: Once | INTRAMUSCULAR | Status: AC
  Administered 2012-07-14: 0.5 mL via INTRAMUSCULAR
  Filled 2012-07-14: qty 0.5

## 2012-07-14 MED ORDER — DOXYCYCLINE HYCLATE 100 MG PO CAPS: 100.0000 mg | ORAL_CAPSULE | Freq: Two times a day (BID) | ORAL | Status: AC

## 2012-07-14 NOTE — ED Notes (Signed)
Pt escorted to discharge window, in no distress.

## 2012-07-14 NOTE — ED Notes (Signed)
Pt states his daughters cat bit him Thursday night on R wrist, started taking augmentin Friday night, states swelling has increased in R wrist, R wrist visibly reddened and swollen. Takes oxycodone for arthritis and that helped pain in wrist.

## 2012-07-14 NOTE — ED Provider Notes (Signed)
History     CSN: [id number]  Arrival date & time 07/14/12  1319   First MD Initiated Contact with Patient 07/14/12 1349      Chief Complaint  Patient presents with  . Animal Bite    (Consider location/radiation/quality/duration/timing/severity/associated sxs/prior treatment) HPI Comments: Patient presents 3 days after cat bite to right wrist. Cat is known and living in house. Wound cleaned well initially. Patient noted increasing redness and pain approx 24 hours after a bite. Patient obtained rx for augmentin and began taking twice a day. Redness, pain, and swelling were stable over the next day. This morning pain, swelling, and redness was worse. Wound was open with a scant amount of drainage. Patient has been up and using arm and swelling improved prior to ED arrival. Patient denies fever, N/V, streaking redness up his arm. Onset gradual. Course is waxing and waning. Nothing makes symptoms worse or better. Patient takes chronic pain medication for arthritis which has helped his wrist soreness.   Patient is a 66 y.o. male presenting with animal bite. The history is provided by the patient and the spouse.  Animal Bite  The incident occurred more than 2 days ago. The incident occurred at home. He came to the ER via personal transport. There is an injury to the right wrist. The pain is mild. It is unlikely that a foreign body is present. Pertinent negatives include no chest pain, no abdominal pain, no nausea, no vomiting, no headaches, no weakness and no cough. His tetanus status is out of date. Services received include medications given.    Past Medical History  Diagnosis Date  . COPD (chronic obstructive pulmonary disease)     History reviewed. No pertinent past surgical history.  Family History  Problem Relation Age of Onset  . Emphysema Mother   . Cancer Father     bile duct    History  Substance Use Topics  . Smoking status: Former Smoker -- 1.0 packs/day for 25 years   Types: Cigarettes    Quit date: 12/18/2009  . Smokeless tobacco: Never Used  . Alcohol Use: 1.8 oz/week    3 Glasses of wine per week      Review of Systems  Constitutional: Negative for fever.  HENT: Negative for sore throat and rhinorrhea.   Eyes: Negative for redness.  Respiratory: Negative for cough.   Cardiovascular: Negative for chest pain.  Gastrointestinal: Negative for nausea, vomiting, abdominal pain and diarrhea.  Genitourinary: Negative for dysuria.  Musculoskeletal: Negative for myalgias and arthralgias.  Skin: Positive for color change and wound. Negative for rash.  Neurological: Negative for weakness and headaches.    Allergies  Sulfa antibiotics and Ceclor  Home Medications   Current Outpatient Rx  Name Route Sig Dispense Refill  . ALBUTEROL SULFATE HFA 108 (90 BASE) MCG/ACT IN AERS Inhalation Inhale 2 puffs into the lungs every 6 (six) hours as needed.    . AMOXICILLIN-POT CLAVULANATE 875-125 MG PO TABS Oral Take 1 tablet by mouth 2 (two) times daily. For 10 days    . DICLOFENAC POTASSIUM 25 MG PO CAPS Oral Take 1 capsule by mouth every 6 (six) hours as needed.    . MOMETASONE FURO-FORMOTEROL FUM 200-5 MCG/ACT IN AERO Inhalation Inhale 2 puffs into the lungs 2 (two) times daily.    . OXYCODONE HCL 5 MG PO CAPS Oral Take 10 mg by mouth 3 (three) times daily.      BP 155/82  Pulse 85  Temp 98.3 F (36.8 C) (  Oral)  Resp 18  SpO2 92%  Physical Exam  Nursing note and vitals reviewed. Constitutional: He appears well-developed and well-nourished.  HENT:  Head: Normocephalic and atraumatic.  Eyes: Conjunctivae are normal. Right eye exhibits no discharge. Left eye exhibits no discharge.  Neck: Normal range of motion. Neck supple.  Cardiovascular: Normal rate, regular rhythm and normal heart sounds.   Pulmonary/Chest: Effort normal and breath sounds normal.  Abdominal: Soft. There is no tenderness.  Musculoskeletal: Normal range of motion. He exhibits edema  and tenderness.       Right elbow: Normal.He exhibits normal range of motion and no swelling.       Arms: Neurological: He is alert.       Distal sensation intact  Skin: Skin is warm and dry.  Psychiatric: He has a normal mood and affect.    ED Course  Procedures (including critical care time)  Labs Reviewed - No data to display No results found.   1. Cat bite of wrist   2. Cellulitis     2:26 PM Patient seen and examined.   Vital signs reviewed and are as follows: Filed Vitals:   07/14/12 1354  BP: 155/82  Pulse:   Temp:   Resp:   BP 155/82  Pulse 85  Temp 98.3 F (36.8 C) (Oral)  Resp 18  SpO2 92%  D/w Dr. Weldon Inches. Patient showed me several pictures of the progression of wound/redness over the past 2 days. It actually appears stable over the past 24 hours -- however patient states it appeared worse this AM. Will broaden coverage to cover MRSA/skin flora with doxycycline. Patient to follow-up with PCP tomorrow afternoon or Tuesday morning (36 hrs).   The patient was urged to return to the Emergency Department urgently with worsening pain, swelling, expanding erythema especially if it streaks away from the affected area, fever, or if they have any other concerns. Patient verbalized understanding.   He will continue home pain meds.   MDM  Cellulitis after cat bite. Waxing and waning but appears stable. Patient has NO systemic symptoms of infection. There does not appear to be underlying abscess, do not suspect joint space infection. No signs of synovitis as patient has full unrestricted ROM in wrist and hand. No lymphangitis. Will broaden abx coverage with doxy, continue augmentin. Patient has reliable outpt follow-up.         Renne Crigler, [place] 07/14/12 [phone]

## 2012-07-14 NOTE — ED Notes (Signed)
Pt states Thursday night he got bit by his daughters cat on R wrist, this morning pain had worsened and couldn't move hand, able to move hand now w/o difficulty, R wrist/hand/fingers swollen and wrist part reddened, small bite mark opened on R wrist. Pt has been taking augmentin.

## 2012-07-17 NOTE — ED Provider Notes (Signed)
Medical screening examination/treatment/procedure(s) were performed by non-physician practitioner and as supervising physician I was immediately available for consultation/collaboration.  Sade Hollon, MD 07/17/12 0005 

## 2012-09-13 ENCOUNTER — Other Ambulatory Visit: Payer: 59

## 2012-09-17 ENCOUNTER — Ambulatory Visit: Payer: 59 | Admitting: Internal Medicine

## 2012-09-19 ENCOUNTER — Other Ambulatory Visit: Payer: 59

## 2012-09-26 ENCOUNTER — Ambulatory Visit (INDEPENDENT_AMBULATORY_CARE_PROVIDER_SITE_OTHER)
Admission: RE | Admit: 2012-09-26 | Discharge: 2012-09-26 | Disposition: A | Discharge status: Home / Self Care | Payer: 59 | Source: Ambulatory Visit | Attending: Internal Medicine | Admitting: Internal Medicine

## 2012-09-26 DIAGNOSIS — R911 Solitary pulmonary nodule: Secondary | ICD-10-CM

## 2012-09-26 IMAGING — CT CT CHEST W/O CM
2 of 3 series · 15 of 36 positions shown, 18 images · IV contrast (Omnipaque 300)
Comparison: [DATE]

CLINICAL DATA: Episodic cough.  Follow-up right middle lobe nodule.

CT CHEST WITHOUT CONTRAST
TECHNIQUE: Multidetector CT imaging of the chest was performed
following the standard protocol without IV contrast.

[Series 2: chest routine with · axial · 0.67mm/px · z∈[-318,-78]mm · 12 of 58 slices shown, 15 images]
[im 5/58  mediastinal]
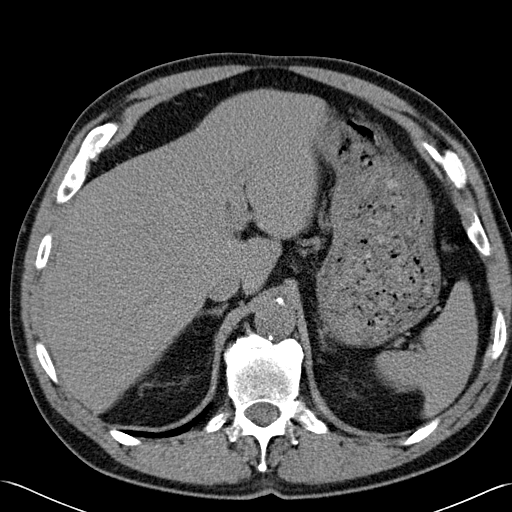
[im 5/58  lung]
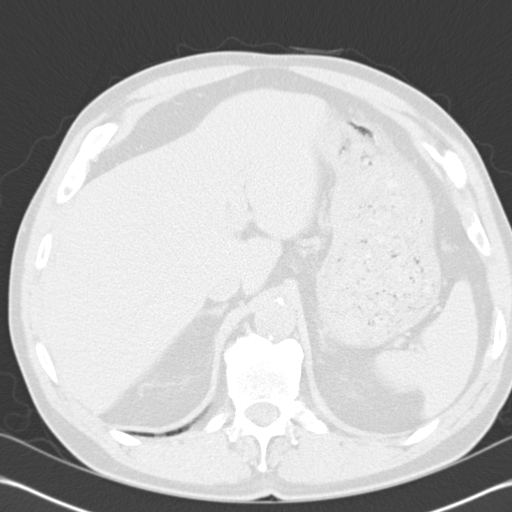
[im 9/58  lung]
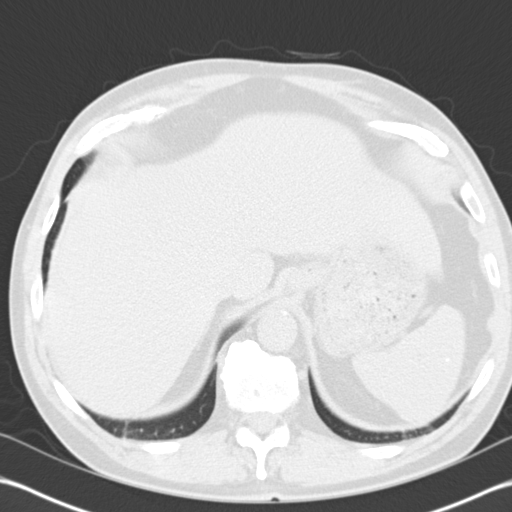
[im 13/58  lung]
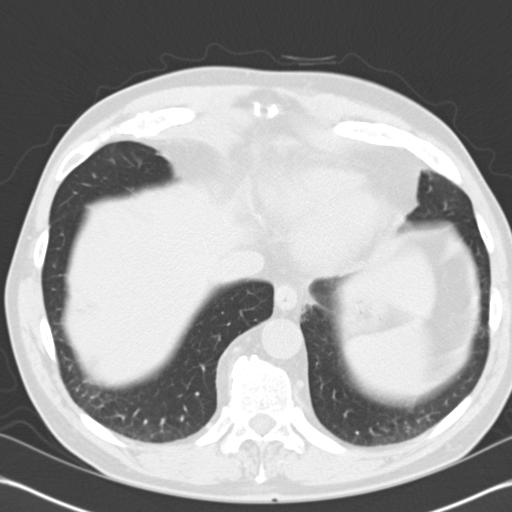
[im 17/58  lung]
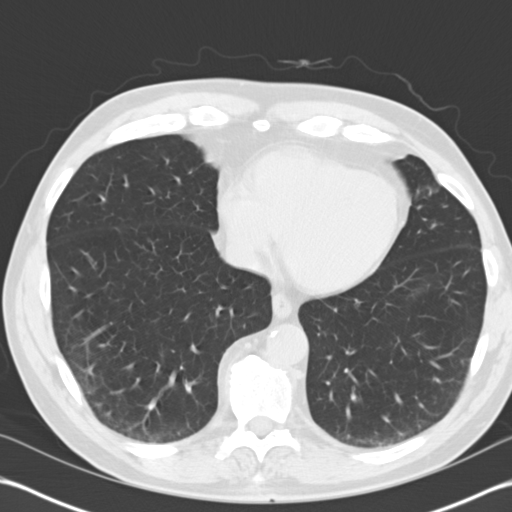
[im 22/58  mediastinal]
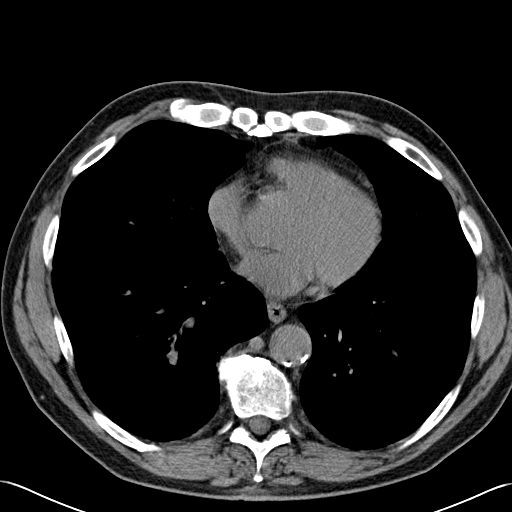
[im 22/58  lung]
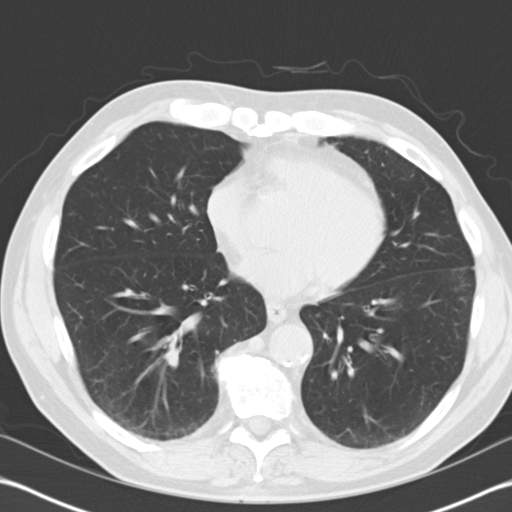
[im 26/58  lung]
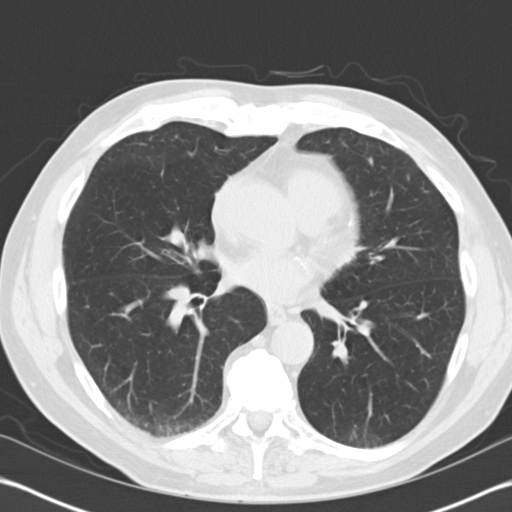
[im 32/58  lung]
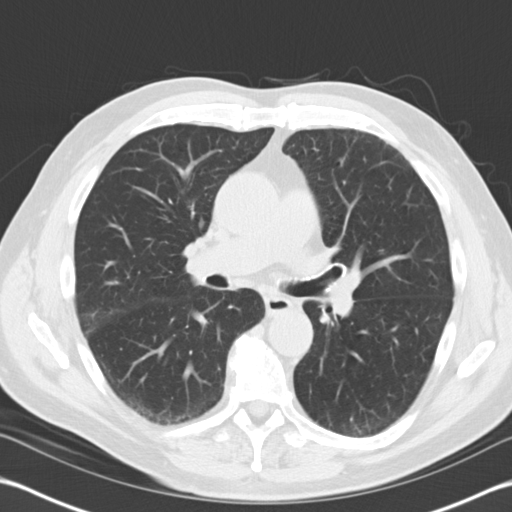
[im 36/58  lung]
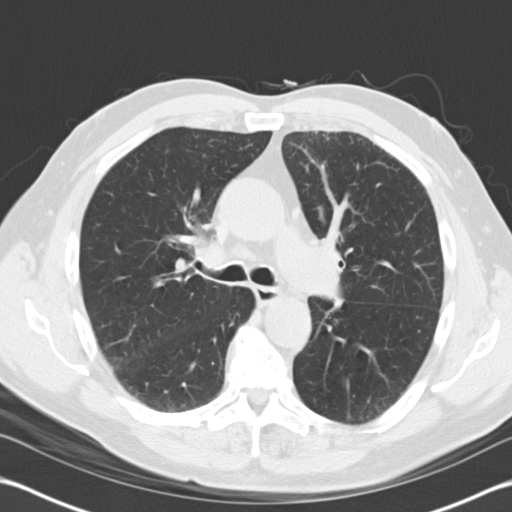
[im 41/58  mediastinal]
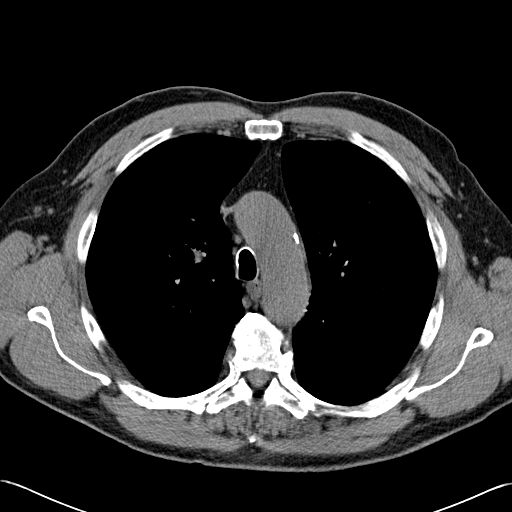
[im 41/58  lung]
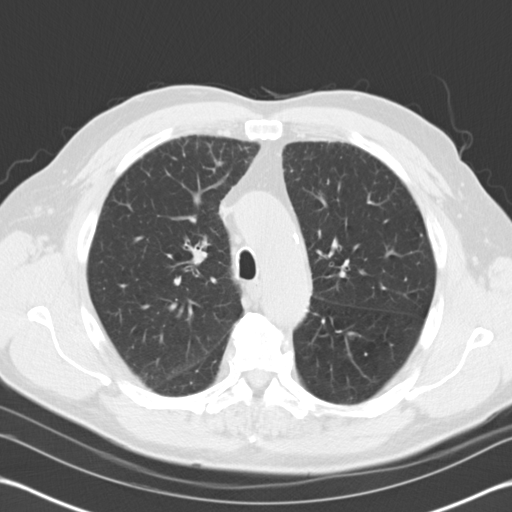
[im 45/58  lung]
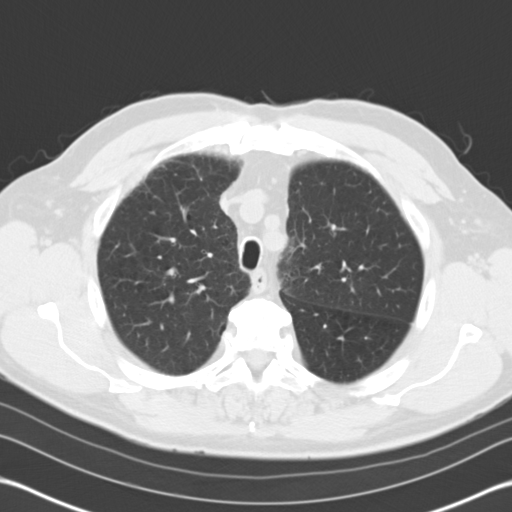
[im 49/58  lung]
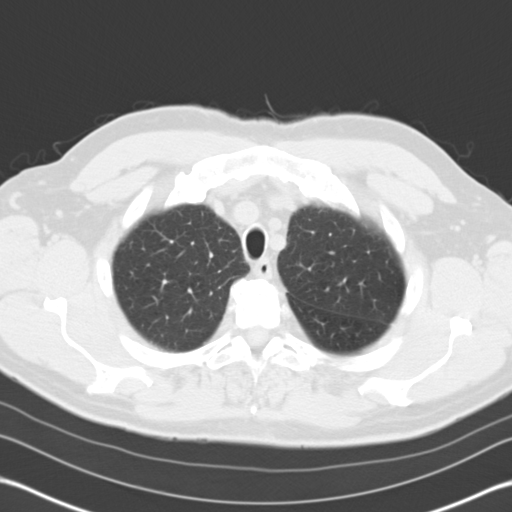
[im 53/58  lung]
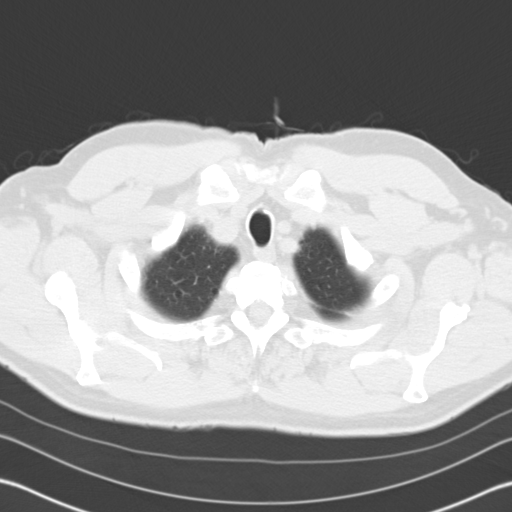

[Series 602: cor · coronal · 0.67mm/px · 3 of 104 slices shown]
[im 21/104  lung]
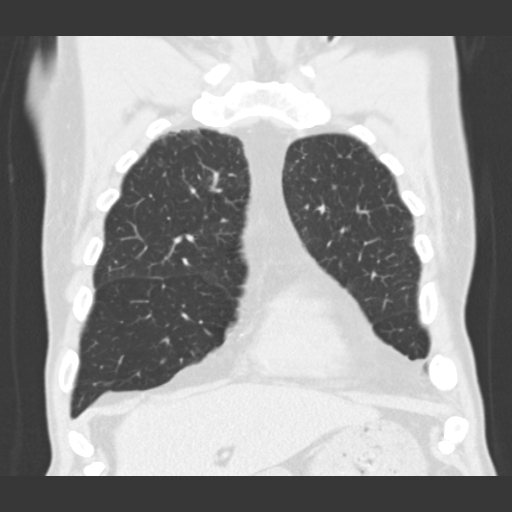
[im 42/104  lung]
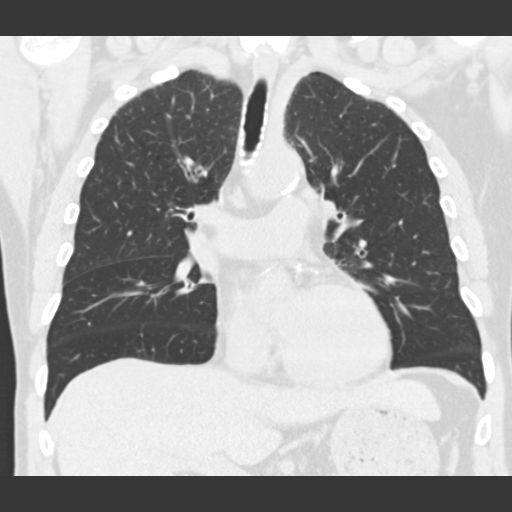
[im 62/104  lung]
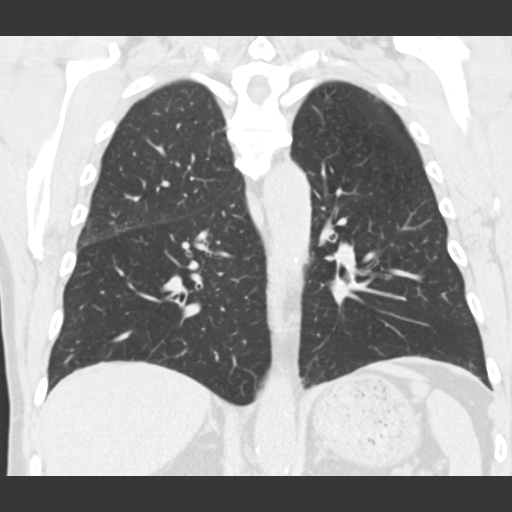

[15 of 36 positions shown; findings below may reference images not displayed]

FINDINGS: Stable mild prominence of the ascending thoracic aorta at
the level of the main pulmonary artery measuring 4.2 x 4.3 cm.  No
lymphadenopathy.  No pericardial or pleural effusion.  Mild
coronary arterial calcification noted.

Minimal biapical pleural thickening.  Mild emphysematous change is
stable.  Previously seen nodular density in the right middle lobe
is no longer identified.  No new pulmonary nodule, mass, or
consolidation.  No acute osseous finding.
IMPRESSION: Apparent interval resolution of previously seen right middle lobe
nodule.

Stable evidence of emphysema.

No new acute abnormality or other abnormality.

Stable ascending aneurysmal ectasia.

## 2012-10-02 NOTE — Progress Notes (Signed)
Quick Note:  Pt aware of results. ______ 

## 2012-10-11 ENCOUNTER — Ambulatory Visit: Payer: 59 | Admitting: Internal Medicine

## 2012-11-04 ENCOUNTER — Ambulatory Visit (INDEPENDENT_AMBULATORY_CARE_PROVIDER_SITE_OTHER): Payer: Medicare Other | Admitting: Internal Medicine

## 2012-11-04 ENCOUNTER — Ambulatory Visit: Payer: 59 | Admitting: Internal Medicine

## 2012-11-04 ENCOUNTER — Encounter: Payer: Self-pay | Admitting: Internal Medicine

## 2012-11-04 VITALS — BP 118/72 | HR 93 | Ht 67.0 in | Wt 190.6 lb

## 2012-11-04 DIAGNOSIS — R911 Solitary pulmonary nodule: Secondary | ICD-10-CM

## 2012-11-04 DIAGNOSIS — J449 Chronic obstructive pulmonary disease, unspecified: Secondary | ICD-10-CM

## 2012-11-04 MED ORDER — ALBUTEROL SULFATE HFA 108 (90 BASE) MCG/ACT IN AERS: INHALATION_SPRAY | RESPIRATORY_TRACT | Status: DC

## 2012-11-04 NOTE — Patient Instructions (Addendum)
Ok to explore how much [facility] you need to feel well-  2 puffs, twice daily, 2 puffs once daily, or none. Ok to restart it after time off if needed.  Ok to use the rescue inhaler. Script sent to refill  Call for refills as needed  We will keep an eye on the small ascending aortic aneurysm over time. Report chest pain.  Please call as needed

## 2012-11-04 NOTE — Progress Notes (Signed)
02/20/12- 65 yoM former smoker referred by Kennedy Bucker, PA-C/ [facility] because of COPD. Spirometry 01/24/12- FVC 2.24/ 64%, FEV1 1.20/ 42%, FEV1/FVC 0.53  He considers himself normally an active and athletic person. In December of 2012 he became aware of shortness of breath walking 100 yards. This is either new or not noticed previously. He had some prednisone left over from an orthopedic visit so he tried that with no effect. In January he went to urgent care where he was diagnosed with pneumonia and treated with Levaquin x10 days with no effect. He denies fever, cough or phlegm. He was given a repeat prednisone taper from 60 mg which seemed to help modestly. Prednisone caused weight gain and nervousness but did seem to make a big difference after a third round, which ended 3 weeks ago.Marland Kitchen He says he is now breathing "phenomenally better". He is still not back to normal exercise tolerance. He notices wheeze when he is short of breath with exertion. He was given Spiriva but decided not to try until he was seen here because he has a history of urinary outlet obstruction. He was also given a sample of Dulera but uncertain effect. For 4 or 5 years he has used a Primatene inhaler for morning shortness of breath occasionally. He did not find a rescue albuterol inhaler as effective. He doesn't know about past history of pneumonia but denies TB exposure. There is no family history of DVT or pulmonary embolism. Mother was a smoker who died at age 31 of COPD. He denies personal history of heart disease or symptoms of chest pain or palpitation. 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    05/17/12- 51 yoM former smoker referred by Kennedy Bucker, PA-C/ [facility] because of COPD. Patient states is better. c/o sob with exertion, wheezing, and cough. Denies chest pain and chest tightness.  CAT COPD assessment test-8/40. Describes the morning cough with thick white  sputum. He tried off [place] but decided it helped him. He feels his shortness of breath is almost back to his baseline of last fall. CT chest 05/02/12- reviewed with him IMPRESSION:  No evidence of pulmonary embolism.  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  Scattered atherosclerotic disease.  Question pulmonary arterial hypertension.  Minimal patchy central infiltrates in right upper right lower lobes  with a questionable 8 mm diameter stellate density versus scarring  in the right middle lobe.  If the patient has prior outside CT exams, recommend these be  obtained for comparison to establish stability.  In the absence of prior exams, recommend referral to the  [facility] for management.  Original Report Authenticated By: Lollie Marrow, M.D.   11/04/12- 68 yoM former smoker followed for COPD, hx lung nodule. FOLLOWS FOR: sob and wheezing at times-mainly in the mornings (does physical work during the day) Declines flu vaccine. 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 Minor cough. Since his exacerbation last year he has improved significantly but still more dyspnea with exertion than he remembers. Wakes each morning with chest congestion, morning cough with clear mucus. Uses Ventolin rescue inhaler twice daily and continues Dulera 100. CT 09/26/12-images reviewed with him IMPRESSION:  Apparent interval resolution of previously seen right middle lobe  nodule.  Stable evidence of emphysema.  No new acute abnormality or other abnormality.  Stable ascending aneurysmal ectasia.  Original Report Authenticated By: Harrel Lemon, M.D.   ROS-see HPI Constitutional:   No-   weight loss, night sweats, fevers, chills, fatigue, lassitude. HEENT:   No-  headaches, difficulty swallowing, tooth/dental problems, sore throat,       No-  sneezing, itching, ear ache, nasal congestion,  post nasal drip,  CV:  No-   chest pain, orthopnea, PND, swelling in lower extremities, anasarca,  dizziness, palpitations Resp: +   shortness of breath with exertion or at rest.              No-   productive cough,  No non-productive cough,  No- coughing up of blood.              No-   change in color of mucus.  No- wheezing.   Skin: No-   rash or lesions. GI:  No-   heartburn, indigestion, abdominal pain, nausea, vomiting GU: No-   dysuria,. MS:  +  joint pain or swelling.  + decreased range of motion.  +- back pain. Neuro-     nothing unusual Psych:  No- change in mood or affect. No depression or anxiety.  No memory loss.  OBJ- Physical Exam General- Alert, Oriented, Affect-appropriate, Distress- none acute Skin- rash-none, lesions- none, excoriation- none Lymphadenopathy- none Head- atraumatic            Eyes- Gross vision intact, PERRLA, conjunctivae and secretions clear            Ears- Hearing, canals-normal            Nose- Clear, no-Septal dev, mucus, polyps, erosion, perforation             Throat- Mallampati II , mucosa clear , drainage- none, tonsils- atrophic Neck- flexible , trachea midline, no stridor , thyroid nl, carotid no bruit Chest - symmetrical excursion , unlabored           Heart/CV- RRR , no murmur , no gallop  , no rub, nl s1 s2                           - JVD- none , edema- none, stasis changes- none, varices- none           Lung- clear to P&A, no wheeze, cough- none , dullness-none, rub- none           Chest wall-  Abd-  Br/ Gen/ Rectal- Not done, not indicated Extrem- cyanosis- none, clubbing, none, atrophy- none, strength- nl.  Neuro- grossly intact to observation

## 2012-11-11 NOTE — Assessment & Plan Note (Signed)
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 I discussed images with him. He is going to test usefulness of Dulera by using it on and off as discussed.

## 2012-11-13 DIAGNOSIS — E291 Testicular hypofunction: Secondary | ICD-10-CM | POA: Diagnosis not present

## 2012-11-13 DIAGNOSIS — D4959 Neoplasm of unspecified behavior of other genitourinary organ: Secondary | ICD-10-CM | POA: Diagnosis not present

## 2013-01-17 DIAGNOSIS — E291 Testicular hypofunction: Secondary | ICD-10-CM | POA: Diagnosis not present

## 2013-01-17 DIAGNOSIS — D4959 Neoplasm of unspecified behavior of other genitourinary organ: Secondary | ICD-10-CM | POA: Diagnosis not present

## 2013-02-28 ENCOUNTER — Other Ambulatory Visit: Payer: Self-pay | Admitting: Urology

## 2013-03-11 ENCOUNTER — Encounter (HOSPITAL_BASED_OUTPATIENT_CLINIC_OR_DEPARTMENT_OTHER): Payer: Self-pay | Admitting: *Deleted

## 2013-03-11 NOTE — H&P (Signed)
History of Present Illness                         F/u hypogonadism. He is doing well with his testosterone shots at 0.6 cc every 2 weeks given by his wife. He's been on T replacement for 20 yrs. Sept T was 435. PSA September 2011 was 0.96. 2 peak levels in May 2012 were 650 and 391, and a trough level of 139 also in May 2012. He had osteopenia about DEXA scan but has stopped taking Actonel for the last several years since his testosterones have been normal. PSA September 2011 was 0.96. T levels in May 2012 were 650 and 391, and a trough level of 139 also in May 2012. He had osteopenia about DEXA scan but has stopped taking Actonel for the last several years since his testosterones have been normal. DEXA Dec 2012 was improved compared to 5 years ago (normal to minimal osteopenia).  -Nov 2013 PSA 0.48 -Dec 2013 He has noticed his energy and "creativity" was low; 120 mg/0.6 cc testosterone IM every 2 weeks. Nov 2013 Testosterone was 184 which was just before his prior injection. His PSA was normal/stable; increased T to 0.75 / 150 mg twice a month. Normal DRE.  -Feb 2014 cbc normal; T 156 then 321.   Balanitis - started [facility] Dec 2013. Continued acyclovir prn.    He has noticed curve upward of the penis since early 2013. He had no pain or trouble with penetration. He gets a good erection. He takes Viagra on occasion working well. 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 However since the attempt at dilation his stream has been "much better", he takes less time to empty, and is quite pleased. He had urine cytology negative x 2. His stream improved and it has been in years.     He was seen Aug 2012 with left sided low back pain.  His UA was clear and he continued NSAIDs and hydrocodone once in the morning and once in the evening for arthritis pain.     Interval Hx T replacement  , still feels low. He upped it to 0.9 cc / 180 mg seven days. He didnt like AndroGel. We will check a T today. He noted low creativity and low energy. Also his libido is low and his wife asked if he was having an "affair" due to his lack on interest.   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 The area has been there about 2 years.  He said that he is not circumcised but that at one point his foreskin and simply "tore off".    Past Medical History Problems  1. History of  Acute Cystitis 595.0 2. History of  Arthritis V13.4 3. History of  Hypercholesterolemia 272.0 4. History of  Libido, Decreased 799.81 5. History of  Microscopic Hematuria 599.72  Surgical History Problems  1. History of  Rotator Cuff Repair  Current Meds 1. Acyclovir 400 MG Oral Tablet; TAKE 1 TABLET 3 TIMES DAILY; Therapy: 01Feb2013 to  (Evaluate:31Dec2013)  Requested for: 11Dec2013; Last Rx:11Dec2013 2. Dulera 200-5 MCG/ACT Inhalation Aerosol; Therapy: 07Feb2013 to 3. Nystatin-Triamcinolone 100000-0.1  UNIT/GM-% External Cream; APPLY SPARINGLY TO  AFFECTED AREA(S) 3 TIMES A DAY; Therapy: 11Dec2013 to (Evaluate:18Dec2013)  Requested  for: 11Dec2013; Last Rx:11Dec2013 4. OxyCODONE HCl 10 MG Oral Tablet; Therapy: 10Jul2013 to 5. Testosterone Cypionate 200 MG/ML Intramuscular Oil; Inject 0.75 ml every 2 weeks; Therapy:  19Feb2009 to (Evaluate:11Mar2014)  Requested for: 11Dec2013; Last Rx:11Dec2013 6. Ventolin HFA 108 (90 Base) MCG/ACT Inhalation Aerosol Solution; Therapy: 22Dec2012 to 7. Viagra 100 MG Oral Tablet; TAKE ONE-HALF TO ONE TABLET BY MOUTH APPROXIMATELY ONE  HOUR BEFORE SEXUAL ACTIVITY. DO NOT USE MORE THAN ONE DOSE DAILY; Therapy:  31Aug2007 to (Evaluate:02Jun2013)  Requested for: 21May2013; Last Rx:21May2013 8. Zipsor 25 MG Oral Capsule;  Therapy: 22Nov2011 to  Allergies Medication  1. Ceclor CAPS 2. Sulfa Drugs  Family History Problems  1. Family history of  Family Health Status Number Of Children 1 son and 3 daughters  Social History Problems  1. Alcohol Use 2. Family history of  Death In The Family Father 29 3. Family history of  Death In The Family Mother 52 4. Marital History - Currently Married 5. Occupation: IT sales professional 6. Tobacco Use V15.82 <1 ppd for 10 years  Review of Systems Cardiovascular, pulmonary, musculoskeletal and neurological system(s) were reviewed and pertinent findings if present are noted.    Vitals Vital Signs [Data Includes: Last 1 Day]  11Mar2014 01:32PM  BMI Calculated: 27.28 BSA Calculated: 1.96 Weight: 180 lb  Blood Pressure: 142 / 92 Temperature: 98.4 F Heart Rate: 97  Physical Exam Constitutional: Well nourished and well developed . No acute distress.  Pulmonary: No respiratory distress and normal respiratory rhythm and effort.  Cardiovascular: Heart rate and rhythm are normal . No peripheral edema.  Genitourinary: Penile lesion: A single 2 cm erythematous and circular in shape plaque(s) noted. On dorsal surface.  Neuro/Psych:. Mood and affect are appropriate.    Results/Data Urine [Data Includes: Last 1 Day]   11Mar2014  COLOR YELLOW   APPEARANCE CLEAR   SPECIFIC GRAVITY 1.020   pH 7.0   GLUCOSE NEG mg/dL  BILIRUBIN NEG   KETONE NEG mg/dL  BLOOD NEG   PROTEIN NEG mg/dL  UROBILINOGEN 0.2 mg/dL  NITRITE NEG   LEUKOCYTE ESTERASE NEG    Assessment Assessed  1. 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 Nystatin-Triamcinolone 100000-0.1 UNIT/GM-% External Cream; APPLY SPARINGLY TO  AFFECTED AREA(S) 3 TIMES A DAY; Therapy: 11Dec2013 to 11Mar2014; Last Rx:11Dec2013;  Status: DISCONTINUED Health Maintenance (V70.0)  2. UA With REFLEX  Done: 11Mar2014 01:17PM Hypogonadism (257.2)  3. TESTOSTERONE  Requested for:  11Mar2014 1  4. TESTOSTERONE  Requested for: 11Mar2014 1  Penile Neoplasm (239.5)  5. Follow-up Schedule Surgery Office  Follow-up  Requested for: 11Mar2014 1   1. Amended By: Jerilee Field; 02/25/2013 2:12 PMEST   Discussion/Summary       T was sent. We discussed the most recent literature on CV, DVT and sudden death with T replacement and ES and FDA investigations into risks of T. He said T replacement has significantly improved his "qualityof life". Therefore we discussed would like to get the testosterone at a peak between 400 - 800 a week after an injection and around 300-400 just prior to his next injection. We will send the testosterone today which will be a peak level.  In regards to the penile lesion we discussed  this could be a benign, precancerous or a cancerous lesion. We discussed a limited punch biopsy or complete excision and he would like  to have the whole thing removed. We discussed risk of doing such as more trouble with erections, increase or decrease in penile sensation which may or may not be bothersome, increase or decrease in penile curvature among other risks. Also discussed poor cosmesis. He does have some redundant skin so hopefully we can make an elliptical incision and bring the edges together without too much of a dogear without limiting the skin.    Signatures Electronically signed by : Jerilee Field, M.D.; Feb 25 2013  2:12PM

## 2013-03-11 NOTE — Progress Notes (Signed)
NPO AFTER MN. ARRIVES AT 0815. NEEDS HG . CURRENT CHEST CT AND EKG IN EPIC AND CHART. WILL TAKE OXYCODONE AND DO DULERA INHALER AM OF SURG W/ SIPS OF WATER, ALSO TO BRING RESCUE INHALER.

## 2013-03-14 ENCOUNTER — Encounter (HOSPITAL_BASED_OUTPATIENT_CLINIC_OR_DEPARTMENT_OTHER): Payer: Self-pay | Admitting: Anesthesiology

## 2013-03-14 ENCOUNTER — Ambulatory Visit (HOSPITAL_BASED_OUTPATIENT_CLINIC_OR_DEPARTMENT_OTHER)
Admission: RE | Admit: 2013-03-14 | Discharge: 2013-03-14 | Disposition: A | Discharge status: Home / Self Care | Payer: 59 | Source: Ambulatory Visit | Attending: Urology | Admitting: Urology

## 2013-03-14 ENCOUNTER — Encounter (HOSPITAL_BASED_OUTPATIENT_CLINIC_OR_DEPARTMENT_OTHER): Payer: Self-pay | Admitting: *Deleted

## 2013-03-14 ENCOUNTER — Ambulatory Visit (HOSPITAL_BASED_OUTPATIENT_CLINIC_OR_DEPARTMENT_OTHER): Payer: 59 | Admitting: Anesthesiology

## 2013-03-14 ENCOUNTER — Encounter (HOSPITAL_BASED_OUTPATIENT_CLINIC_OR_DEPARTMENT_OTHER)
Admission: RE | Disposition: A | Discharge status: Home / Self Care | Payer: Self-pay | Source: Ambulatory Visit | Attending: Urology

## 2013-03-14 DIAGNOSIS — Z888 Allergy status to other drugs, medicaments and biological substances status: Secondary | ICD-10-CM | POA: Insufficient documentation

## 2013-03-14 DIAGNOSIS — Z882 Allergy status to sulfonamides status: Secondary | ICD-10-CM | POA: Insufficient documentation

## 2013-03-14 DIAGNOSIS — D074 Carcinoma in situ of penis: Secondary | ICD-10-CM | POA: Insufficient documentation

## 2013-03-14 DIAGNOSIS — M949 Disorder of cartilage, unspecified: Secondary | ICD-10-CM | POA: Insufficient documentation

## 2013-03-14 DIAGNOSIS — E7800 Pure hypercholesterolemia, unspecified: Secondary | ICD-10-CM | POA: Insufficient documentation

## 2013-03-14 DIAGNOSIS — N476 Balanoposthitis: Secondary | ICD-10-CM | POA: Insufficient documentation

## 2013-03-14 DIAGNOSIS — M129 Arthropathy, unspecified: Secondary | ICD-10-CM | POA: Insufficient documentation

## 2013-03-14 DIAGNOSIS — Z79899 Other long term (current) drug therapy: Secondary | ICD-10-CM | POA: Insufficient documentation

## 2013-03-14 DIAGNOSIS — M899 Disorder of bone, unspecified: Secondary | ICD-10-CM | POA: Insufficient documentation

## 2013-03-14 DIAGNOSIS — I739 Peripheral vascular disease, unspecified: Secondary | ICD-10-CM | POA: Insufficient documentation

## 2013-03-14 DIAGNOSIS — Z87891 Personal history of nicotine dependence: Secondary | ICD-10-CM | POA: Insufficient documentation

## 2013-03-14 DIAGNOSIS — E291 Testicular hypofunction: Secondary | ICD-10-CM | POA: Insufficient documentation

## 2013-03-14 HISTORY — DX: Emphysema, unspecified: J43.9

## 2013-03-14 HISTORY — DX: Other chronic pain: M54.50

## 2013-03-14 HISTORY — DX: Other chronic pain: G89.29

## 2013-03-14 HISTORY — DX: Low back pain: M54.5

## 2013-03-14 HISTORY — PX: PENILE BIOPSY: SHX6013

## 2013-03-14 HISTORY — DX: Pain in right knee: M25.562

## 2013-03-14 HISTORY — DX: Pain in right knee: M25.561

## 2013-03-14 LAB — POCT HEMOGLOBIN-HEMACUE: Hemoglobin: 16 g/dL (ref 13.0–17.0)

## 2013-03-14 SURGERY — BIOPSY, PENIS
Anesthesia: General | Site: Penis | Wound class: Clean

## 2013-03-14 MED ORDER — LIDOCAINE HCL (CARDIAC) 20 MG/ML IV SOLN
INTRAVENOUS | Status: DC | PRN
  Administered 2013-03-14: 50 mg via INTRAVENOUS

## 2013-03-14 MED ORDER — CIPROFLOXACIN IN D5W 400 MG/200ML IV SOLN
400.0000 mg | Freq: Two times a day (BID) | INTRAVENOUS | Status: DC
  Administered 2013-03-14: 400 mg via INTRAVENOUS
  Filled 2013-03-14: qty 200

## 2013-03-14 MED ORDER — HYDROMORPHONE HCL PF 1 MG/ML IJ SOLN
0.2500 mg | INTRAMUSCULAR | Status: DC | PRN
  Filled 2013-03-14: qty 1

## 2013-03-14 MED ORDER — BUPIVACAINE HCL 0.25 % IJ SOLN
INTRAMUSCULAR | Status: DC | PRN
  Administered 2013-03-14: 8 mL

## 2013-03-14 MED ORDER — PROMETHAZINE HCL 25 MG/ML IJ SOLN
6.2500 mg | INTRAMUSCULAR | Status: DC | PRN
  Filled 2013-03-14: qty 1

## 2013-03-14 MED ORDER — DEXAMETHASONE SODIUM PHOSPHATE 4 MG/ML IJ SOLN
INTRAMUSCULAR | Status: DC | PRN
  Administered 2013-03-14: 8 mg via INTRAVENOUS

## 2013-03-14 MED ORDER — PROPOFOL 10 MG/ML IV BOLUS
INTRAVENOUS | Status: DC | PRN
  Administered 2013-03-14: 170 mg via INTRAVENOUS

## 2013-03-14 MED ORDER — MIDAZOLAM HCL 5 MG/5ML IJ SOLN
INTRAMUSCULAR | Status: DC | PRN
  Administered 2013-03-14: 0.5 mg via INTRAVENOUS

## 2013-03-14 MED ORDER — LACTATED RINGERS IV SOLN
INTRAVENOUS | Status: DC
  Administered 2013-03-14 (×2): via INTRAVENOUS
  Filled 2013-03-14: qty 1000

## 2013-03-14 MED ORDER — ONDANSETRON HCL 4 MG/2ML IJ SOLN
INTRAMUSCULAR | Status: DC | PRN
  Administered 2013-03-14: 4 mg via INTRAVENOUS

## 2013-03-14 MED ORDER — 0.9 % SODIUM CHLORIDE (POUR BTL) OPTIME
TOPICAL | Status: DC | PRN
  Administered 2013-03-14: 1000 mL

## 2013-03-14 MED ORDER — LACTATED RINGERS IV SOLN
INTRAVENOUS | Status: DC
  Filled 2013-03-14: qty 1000

## 2013-03-14 MED ORDER — FENTANYL CITRATE 0.05 MG/ML IJ SOLN
INTRAMUSCULAR | Status: DC | PRN
  Administered 2013-03-14 (×2): 25 ug via INTRAVENOUS
  Administered 2013-03-14: 50 ug via INTRAVENOUS

## 2013-03-14 SURGICAL SUPPLY — 30 items
BANDAGE CONFORM 2  STR LF (GAUZE/BANDAGES/DRESSINGS) ×1 IMPLANT
BLADE SURG 15 STRL LF DISP TIS (BLADE) ×1 IMPLANT
BLADE SURG 15 STRL SS (BLADE) ×2
BNDG COHESIVE 1X5 TAN STRL LF (GAUZE/BANDAGES/DRESSINGS) ×2 IMPLANT
CLOTH BEACON ORANGE TIMEOUT ST (SAFETY) ×2 IMPLANT
COVER MAYO STAND STRL (DRAPES) ×2 IMPLANT
COVER TABLE BACK 60X90 (DRAPES) ×2 IMPLANT
DRAPE PED LAPAROTOMY (DRAPES) ×2 IMPLANT
ELECT REM PT RETURN 9FT ADLT (ELECTROSURGICAL) ×2
ELECTRODE REM PT RTRN 9FT ADLT (ELECTROSURGICAL) ×1 IMPLANT
GAUZE SPONGE 4X4 12PLY STRL LF (GAUZE/BANDAGES/DRESSINGS) ×1 IMPLANT
GAUZE VASELINE 1X8 (GAUZE/BANDAGES/DRESSINGS) ×2 IMPLANT
GAUZE XEROFORM 1X8 LF (GAUZE/BANDAGES/DRESSINGS) ×1 IMPLANT
GLOVE BIO SURGEON STRL SZ7 (GLOVE) ×1 IMPLANT
GLOVE BIO SURGEON STRL SZ7.5 (GLOVE) ×2 IMPLANT
GLOVE ECLIPSE 6.5 STRL STRAW (GLOVE) ×1 IMPLANT
GLOVE INDICATOR 7.0 STRL GRN (GLOVE) ×1 IMPLANT
GOWN PREVENTION PLUS LG XLONG (DISPOSABLE) ×2 IMPLANT
GOWN STRL REIN XL XLG (GOWN DISPOSABLE) ×2 IMPLANT
NDL HYPO 25X1 1.5 SAFETY (NEEDLE) ×1 IMPLANT
NEEDLE HYPO 25X1 1.5 SAFETY (NEEDLE) ×2 IMPLANT
NS IRRIG 500ML POUR BTL (IV SOLUTION) ×1 IMPLANT
PACK BASIN DAY SURGERY FS (CUSTOM PROCEDURE TRAY) ×2 IMPLANT
PENCIL BUTTON HOLSTER BLD 10FT (ELECTRODE) ×2 IMPLANT
SUT CHROMIC 3 0 PS 2 (SUTURE) IMPLANT
SUT CHROMIC 3 0 SH 27 (SUTURE) ×3 IMPLANT
SUT SILK 2 0 (SUTURE)
SUT SILK 2-0 18XBRD TIE 12 (SUTURE) IMPLANT
SYR CONTROL 10ML LL (SYRINGE) ×1 IMPLANT
WATER STERILE IRR 500ML POUR (IV SOLUTION) IMPLANT

## 2013-03-14 NOTE — Interval H&P Note (Signed)
History and Physical Interval Note:  03/14/2013 9:16 AM  Kent Flowers  has presented today for surgery, with the diagnosis of penile neoplasm  The various methods of treatment have been discussed with the patient and family. After consideration of risks, benefits and other options for treatment, the patient has consented to  Procedure(s): PENILE BIOPSY (N/A) as a surgical intervention .  The patient's history has been reviewed, patient examined, no change in status, stable for surgery.  I have reviewed the patient's chart and labs. We discussed side effects of the proposed treatment, the likelihood of the patient achieving the goals of the procedure, and any potential problems that might occur during the procedure or recuperation. We discussed a lesion like this could be benign (infectious, inflammation) or neoplastic/cancer. We discussed simply taking a biopsy and then deciding whether to remove the lesion today (after frozen sxn) or at a later date (after a permanent biopsy). He wants to go ahead and have it removed today. We talked again about risks of decreased penile sensation, sexual dysfunction, impotence, poor cosmesis among others. Questions were answered to the patient's satisfaction.  He elects to proceed.    Antony Haste

## 2013-03-14 NOTE — Transfer of Care (Signed)
Immediate Anesthesia Transfer of Care Note  Patient: Kent Flowers [facility]  Procedure(s) Performed: Procedure(s) (LRB): PENILE BIOPSY (N/A)  Patient Location: PACU  Anesthesia Type: General  Level of Consciousness:drowsy  Airway & Oxygen Therapy: Patient Spontanous Breathing and Patient connected to face mask oxygen  Post-op Assessment: Report given to PACU RN and Post -op Vital signs reviewed and stable  Post vital signs: Reviewed and stable  Complications: No apparent anesthesia complications

## 2013-03-14 NOTE — Anesthesia Postprocedure Evaluation (Signed)
Anesthesia Post Note  Patient: Kent Flowers [facility]  Procedure(s) Performed: Procedure(s) (LRB): PENILE BIOPSY (N/A)  Anesthesia type: MAC  Patient location: PACU  Post pain: Pain level controlled  Post assessment: Post-op Vital signs reviewed  Last Vitals:  Filed Vitals:   03/14/13 0836  BP: 143/83  Pulse: 74  Temp: 36.4 C  Resp: 16    Post vital signs: Reviewed  Level of consciousness: sedated  Complications: No apparent anesthesia complications

## 2013-03-14 NOTE — Addendum Note (Signed)
Addendum created 03/14/13 1038 by Briant Sites, CRNA   Modules edited: Anesthesia Responsible Staff

## 2013-03-14 NOTE — Anesthesia Procedure Notes (Signed)
Procedure Name: LMA Insertion Date/Time: 03/14/2013 9:30 AM Performed by: Norva Pavlov Pre-anesthesia Checklist: Patient identified, Emergency Drugs available, Suction available and Patient being monitored Patient Re-evaluated:Patient Re-evaluated prior to inductionOxygen Delivery Method: Circle System Utilized Preoxygenation: Pre-oxygenation with 100% oxygen Intubation Type: IV induction Ventilation: Mask ventilation without difficulty LMA: LMA inserted LMA Size: 5.0 Number of attempts: 1 Airway Equipment and Method: bite block Placement Confirmation: positive ETCO2 Tube secured with: Tape Dental Injury: Teeth and Oropharynx as per pre-operative assessment

## 2013-03-14 NOTE — Op Note (Signed)
Preoperative diagnosis: Penile neoplasm  postoperative diagnosis: Penile neoplasm  Procedure: Excisional biopsy penile skin 2 cm   Surgeon: Mena Goes   Type of anesthesia: Gen.   Findings:  An erythematous red plaque  On the dorsal distal shaft on the penile skin 2 mm proximal to the line of his prior circumcision. The lesion was approximately a 1-1.5 cm oval.   Description of procedure: After consent was obtained patient brought to the operating room. After adequate anesthesia the external genitalia were prepped and draped in the usual sterile fashion. A timeout was performed to confirm the patient and procedure. A proximal penile block was performed by injecting 8 cc of quarter percent Marcaine along the dorsal surface of the penis from 10:00 to 2:00. Using the marking pen I marked a line distal to the lesion along his prior circumcision and then came laterally to the lesion leaving a 2-3 mm margin all the way around the lesion. The lateral incision was then brought proximally down the penis shaft angling laterally to create a good skin flap. I then came proximally under the lesion again leaving a margin. This excised the oval of skin with the lesion in the middle. Using the Bovie cautery the darkest fascia was separated underneath and the skin removed and sent to pathology. The curved edges were then smoothed out with tenotomy scissors and the flap pulled distally with 3 anastomosis after irrigating the wound and insuring adequate hemostasis with interrupted 3-0 chromic suture. The started in the midline with laterally. The dogears on each side were smoothed out with the tenotomy scissors and the incision closed with interrupted suture. This created a good biopsy was a normal margin around the lesion in question and a good cosmetic repair. The wound was cleaned and dressed with Vaseline gauze fluffs and a jock strap. He was awakened taken to room in stable condition.  Complications: None  Blood  loss: Minimal  Drains: None  Specimens: Penile biopsy, sent to pathology, permanent.  Disposition: Patient stable to PACU.

## 2013-03-14 NOTE — Anesthesia Preprocedure Evaluation (Signed)
Anesthesia Evaluation  Patient identified by MRN, date of birth, ID band Patient awake    Reviewed: Allergy & Precautions, H&P , NPO status , Patient's Chart, lab work & pertinent test results  Airway Mallampati: II TM Distance: >3 FB Neck ROM: Full    Dental  (+) Teeth Intact, Dental Advisory Given and Caps   Pulmonary COPDformer smoker,  breath sounds clear to auscultation  Pulmonary exam normal       Cardiovascular + Peripheral Vascular Disease Rhythm:Regular Rate:Normal     Neuro/Psych negative neurological ROS  negative psych ROS   GI/Hepatic negative GI ROS, Neg liver ROS,   Endo/Other  negative endocrine ROS  Renal/GU negative Renal ROS   Penile neoplasm negative genitourinary   Musculoskeletal negative musculoskeletal ROS (+)   Abdominal   Peds  Hematology negative hematology ROS (+)   Anesthesia Other Findings   Reproductive/Obstetrics                           Anesthesia Physical Anesthesia Plan  ASA: II  Anesthesia Plan: General   Post-op Pain Management:    Induction: Intravenous  Airway Management Planned: LMA  Additional Equipment:   Intra-op Plan:   Post-operative Plan: Extubation in OR  Informed Consent: I have reviewed the patients History and Physical, chart, labs and discussed the procedure including the risks, benefits and alternatives for the proposed anesthesia with the patient or authorized representative who has indicated his/her understanding and acceptance.   Dental advisory given  Plan Discussed with: CRNA  Anesthesia Plan Comments:         Anesthesia Quick Evaluation

## 2013-03-17 ENCOUNTER — Encounter (HOSPITAL_BASED_OUTPATIENT_CLINIC_OR_DEPARTMENT_OTHER): Payer: Self-pay | Admitting: Urology

## 2013-04-01 ENCOUNTER — Other Ambulatory Visit: Payer: Self-pay | Admitting: Internal Medicine

## 2013-04-01 ENCOUNTER — Other Ambulatory Visit: Payer: Self-pay | Admitting: Physician Assistant

## 2013-04-09 ENCOUNTER — Telehealth: Payer: Self-pay | Admitting: Internal Medicine

## 2013-04-09 MED ORDER — MOMETASONE FURO-FORMOTEROL FUM 200-5 MCG/ACT IN AERO: 2.0000 | INHALATION_SPRAY | Freq: Two times a day (BID) | RESPIRATORY_TRACT | Status: DC

## 2013-04-09 NOTE — Telephone Encounter (Signed)
Rx has been sent in. Pt is aware. I apologized that he has run out of his medication before we refilled this for him.

## 2013-04-09 NOTE — Telephone Encounter (Signed)
Empty refill request. Per phone msg from 04/09/13, dulera rx sent.

## 2013-05-06 ENCOUNTER — Ambulatory Visit (INDEPENDENT_AMBULATORY_CARE_PROVIDER_SITE_OTHER): Payer: 59 | Admitting: Internal Medicine

## 2013-05-06 ENCOUNTER — Encounter: Payer: Self-pay | Admitting: Internal Medicine

## 2013-05-06 VITALS — BP 138/70 | HR 112 | Ht 67.0 in | Wt 186.8 lb

## 2013-05-06 DIAGNOSIS — J449 Chronic obstructive pulmonary disease, unspecified: Secondary | ICD-10-CM

## 2013-05-06 DIAGNOSIS — J4489 Other specified chronic obstructive pulmonary disease: Secondary | ICD-10-CM

## 2013-05-06 DIAGNOSIS — R911 Solitary pulmonary nodule: Secondary | ICD-10-CM

## 2013-05-06 MED ORDER — FLUTICASONE FUROATE-VILANTEROL 100-25 MCG/INH IN AEPB: 1.0000 | INHALATION_SPRAY | Freq: Every day | RESPIRATORY_TRACT | Status: DC

## 2013-05-06 NOTE — Patient Instructions (Addendum)
Sample Breo Ellipta   1 puff then rinse mouth, just once every day      Try this instead of Dulera. When the sample is used up, go back to the [facility].  Order- future CT chest, non-contrast       dx lung nodule     To be done in October, 2014

## 2013-05-06 NOTE — Progress Notes (Signed)
02/20/12- 67 yoM former smoker referred by Mathews Robinsons, PA-C/ [facility] because of COPD. Spirometry 01/24/12- FVC 2.24/ 64%, FEV1 1.20/ 42%, FEV1/FVC 0.53  He considers himself normally an active and athletic person. In December of 2012 he became aware of shortness of breath walking 100 yards. This is either new or not noticed previously. He had some prednisone left over from an orthopedic visit so he tried that with no effect. In January he went to urgent care where he was diagnosed with pneumonia and treated with Levaquin x10 days with no effect. He denies fever, cough or phlegm. He was given a repeat prednisone taper from 60 mg which seemed to help modestly. Prednisone caused weight gain and nervousness but did seem to make a big difference after a third round, which ended 3 weeks ago.Marland Kitchen He says he is now breathing "phenomenally better". He is still not back to normal exercise tolerance. He notices wheeze when he is short of breath with exertion. He was given Spiriva but decided not to try until he was seen here because he has a history of urinary outlet obstruction. He was also given a sample of Dulera but uncertain effect. For 4 or 5 years he has used a Primatene inhaler for morning shortness of breath occasionally. He did not find a rescue albuterol inhaler as effective. He doesn't know about past history of pneumonia but denies TB exposure. There is no family history of DVT or pulmonary embolism. Mother was a smoker who died at age 8 of COPD. He denies personal history of heart disease or symptoms of chest pain or palpitation. 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    05/17/12- 67 yoM former smoker referred by Mathews Robinsons, PA-C/ [place] because of COPD. Patient states is better. c/o sob with exertion, wheezing, and cough. Denies chest pain and chest tightness.  CAT COPD assessment test-8/40. Describes the morning cough with thick white  sputum. He tried off [place] but decided it helped him. He feels his shortness of breath is almost back to his baseline of last fall. CT chest 05/02/12- reviewed with him IMPRESSION:  No evidence of pulmonary embolism.  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  Scattered atherosclerotic disease.  Question pulmonary arterial hypertension.  Minimal patchy central infiltrates in right upper right lower lobes  with a questionable 8 mm diameter stellate density versus scarring  in the right middle lobe.  If the patient has prior outside CT exams, recommend these be  obtained for comparison to establish stability.  In the absence of prior exams, recommend referral to the  [facility] for management.  Original Report Authenticated By: Burnetta Sabin, M.D.   11/04/12- 67 yoM former smoker followed for COPD, hx lung nodule. FOLLOWS FOR: sob and wheezing at times-mainly in the mornings (does physical work during the day) Declines flu vaccine. 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 Minor cough. Since his exacerbation last year he has improved significantly but still more dyspnea with exertion than he remembers. Wakes each morning with chest congestion, morning cough with clear mucus. Uses Ventolin rescue inhaler twice daily and continues Dulera 100.  Original Report Authenticated By: Arline Asp, M.D.   05/06/13- 67 yoM former smoker followed for COPD, hx lung nodule. FOLLOWS FOR: most every morning has congestion and able to bring up clear/white phlegm. Never  fully recovered exercise tolerance after viral illness in 2013 but proud to be physically active at work. CXR 02/02/13 IMPRESSION:  Apparent interval resolution of previously seen right middle lobe  nodule.  Stable evidence of emphysema.  No new acute abnormality or other abnormality.  Stable ascending aneurysmal ectasia.   Original Report Authenticated By: Harrel Lemon, M.D.   ROS-see HPI Constitutional:   No-   weight loss, night sweats, fevers, chills, fatigue, lassitude. HEENT:   No-  headaches, difficulty swallowing, tooth/dental problems, sore throat,       No-  sneezing, itching, ear ache, nasal congestion, post nasal drip,  CV:  No-   chest pain, orthopnea, PND, swelling in lower extremities, anasarca,  dizziness, palpitations Resp: +   shortness of breath with exertion or at rest.              +  productive cough,  No non-productive cough,  No- coughing up of blood.              No-   change in color of mucus.  No- wheezing.   Skin: No-   rash or lesions. GI:  No-   heartburn, indigestion, abdominal pain, nausea, vomiting GU: No-   dysuria,. MS:  +  joint pain or swelling.  + decreased range of motion.  +- back pain. Neuro-     nothing unusual Psych:  No- change in mood or affect. No depression or anxiety.  No memory loss.  OBJ- Physical Exam General- Alert, Oriented, Affect-appropriate, Distress- none acute Skin- rash-none, lesions- none, excoriation- none Lymphadenopathy- none Head- atraumatic            Eyes- Gross vision intact, PERRLA, conjunctivae and secretions clear            Ears- Hearing, canals-normal            Nose- Clear, no-Septal dev, mucus, polyps, erosion, perforation             Throat- Mallampati II , mucosa clear , drainage- none, tonsils- atrophic Neck- flexible , trachea midline, no stridor , thyroid nl, carotid no bruit Chest - symmetrical excursion , unlabored           Heart/CV- RRR , no murmur , no gallop  , no rub, nl s1 s2                           - JVD- none , edema- none, stasis changes- none, varices- none           Lung- +raspy laugh, no wheeze, cough- none , dullness-none, rub- none           Chest wall-  Abd-  Br/ Gen/ Rectal- Not done, not indicated Extrem- cyanosis- none, clubbing, none, atrophy- none, strength- nl.  Neuro- grossly intact to  observation

## 2013-05-18 NOTE — Assessment & Plan Note (Signed)
Explained limits to imaging. Plan-followup chest CT at one year, in October 2014

## 2013-05-18 NOTE — Assessment & Plan Note (Signed)
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

## 2013-05-20 ENCOUNTER — Encounter: Payer: Self-pay | Admitting: Gastroenterology

## 2013-07-29 ENCOUNTER — Telehealth: Payer: Self-pay | Admitting: Internal Medicine

## 2013-07-29 MED ORDER — FLUTICASONE FUROATE-VILANTEROL 100-25 MCG/INH IN AEPB: 1.0000 | INHALATION_SPRAY | Freq: Every day | RESPIRATORY_TRACT | Status: DC

## 2013-07-29 NOTE — Telephone Encounter (Signed)
Rx has been sent in. Pt is aware. 

## 2013-09-17 ENCOUNTER — Other Ambulatory Visit: Payer: 59

## 2013-10-01 ENCOUNTER — Ambulatory Visit (INDEPENDENT_AMBULATORY_CARE_PROVIDER_SITE_OTHER)
Admission: RE | Admit: 2013-10-01 | Discharge: 2013-10-01 | Disposition: A | Discharge status: Home / Self Care | Payer: 59 | Source: Ambulatory Visit | Attending: Internal Medicine | Admitting: Internal Medicine

## 2013-10-01 DIAGNOSIS — R911 Solitary pulmonary nodule: Secondary | ICD-10-CM

## 2013-10-01 IMAGING — CT CT CHEST W/O CM
2 of 4 series · 15 of 36 positions shown, 18 images · IV contrast (Omnipaque 300)
Comparison: [DATE]

CLINICAL DATA: Pulmonary nodules

EXAM:
CT CHEST WITHOUT CONTRAST
TECHNIQUE: Multidetector CT imaging of the chest was performed following the
standard protocol without IV contrast.

[Series 2: chest routine with · axial · 0.73mm/px · z∈[-296,-26]mm · 12 of 64 slices shown, 15 images]
[im 5/64  mediastinal]
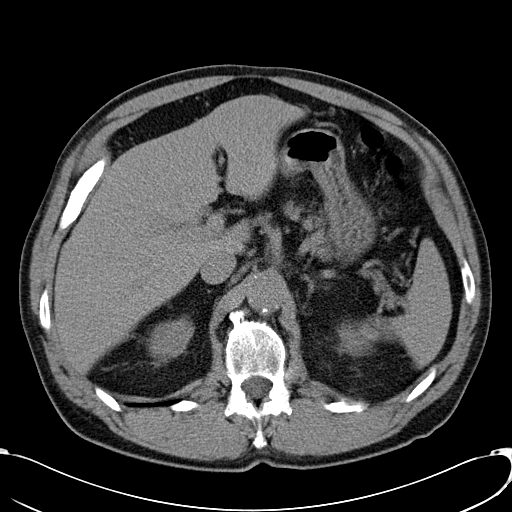
[im 5/64  lung]
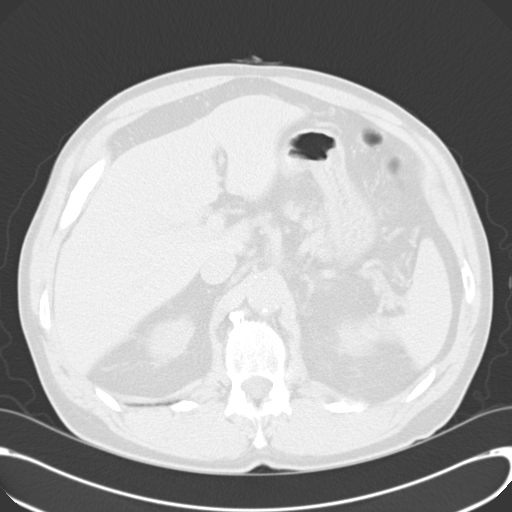
[im 10/64  lung]
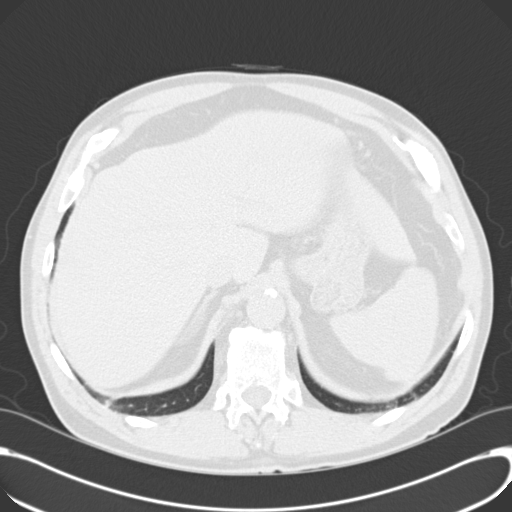
[im 15/64  lung]
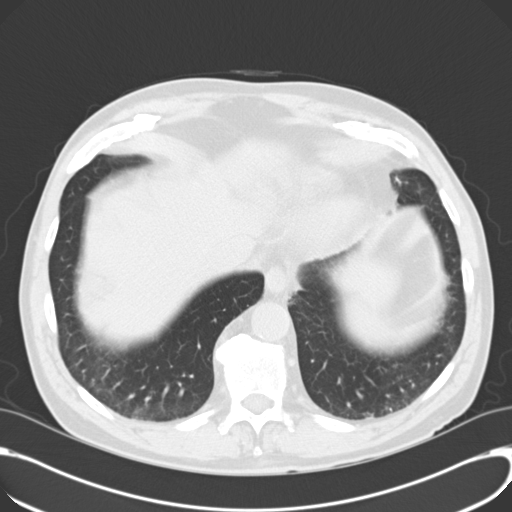
[im 20/64  lung]
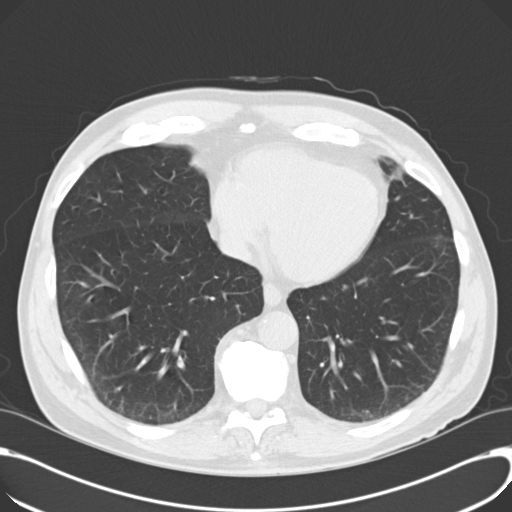
[im 25/64  mediastinal]
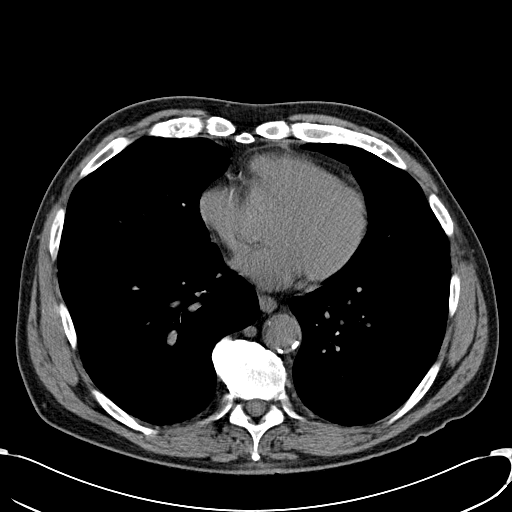
[im 25/64  lung]
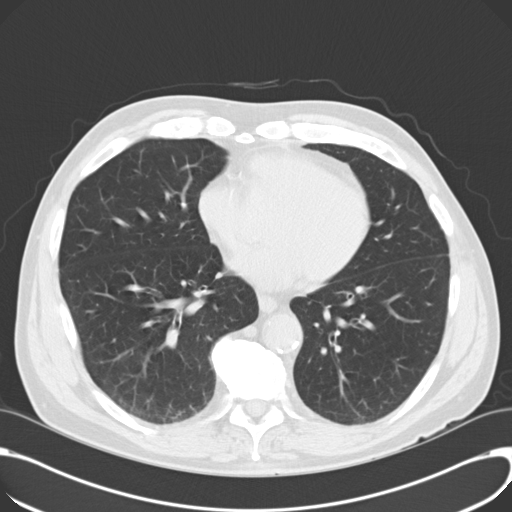
[im 30/64  lung]
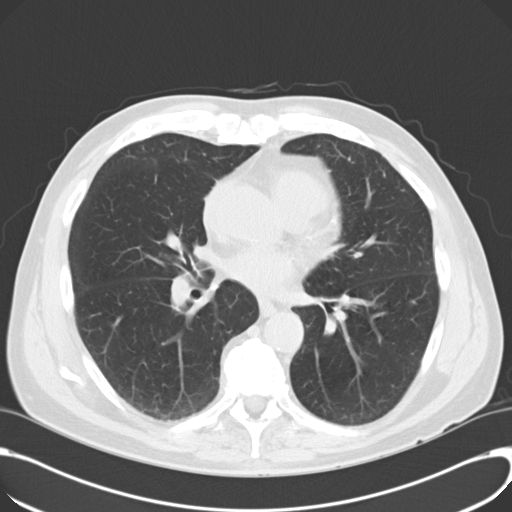
[im 34/64  lung]
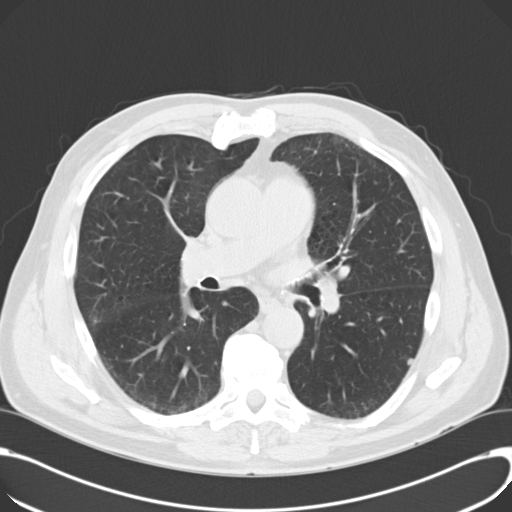
[im 39/64  lung]
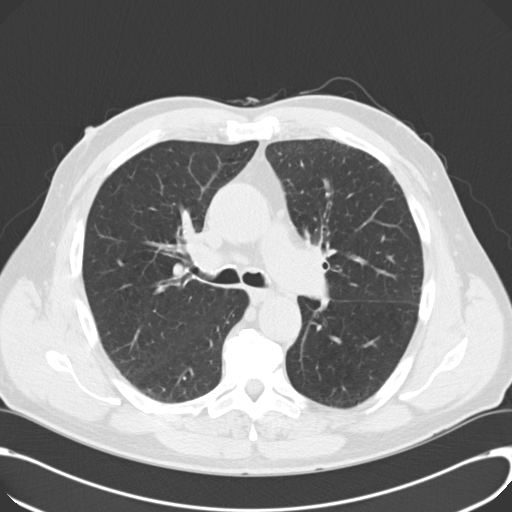
[im 44/64  mediastinal]
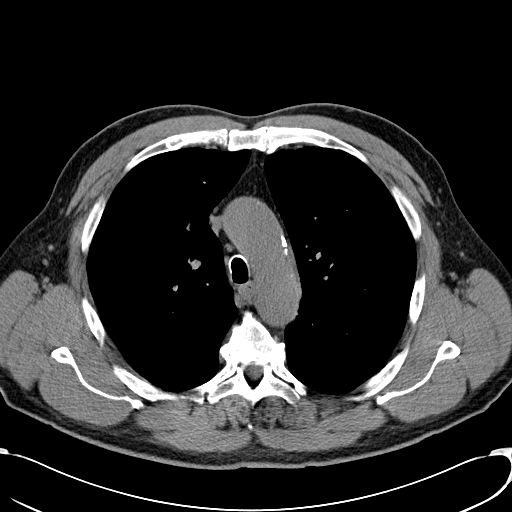
[im 44/64  lung]
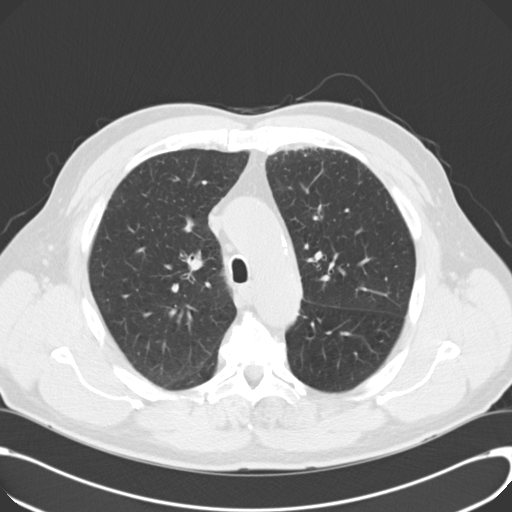
[im 49/64  lung]
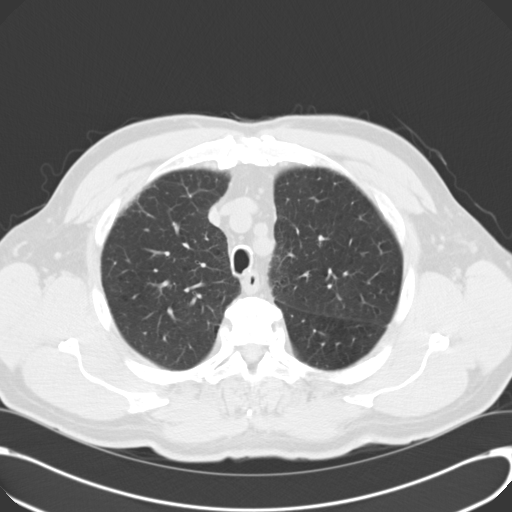
[im 54/64  lung]
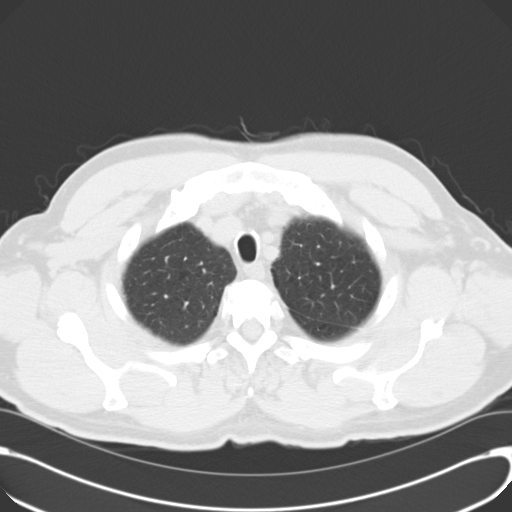
[im 59/64  lung]
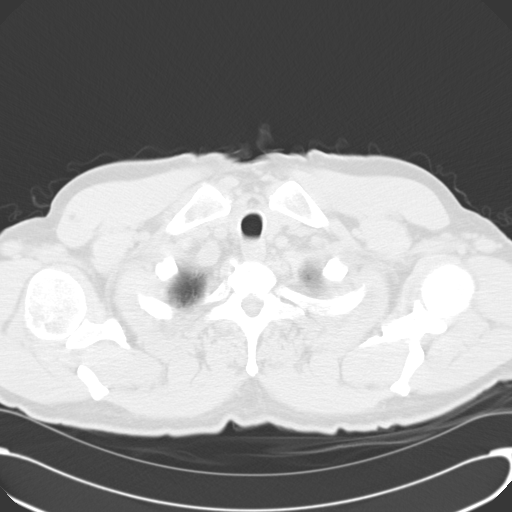

[Series 602: cor · coronal · 0.73mm/px · 3 of 125 slices shown]
[im 25/125  lung]
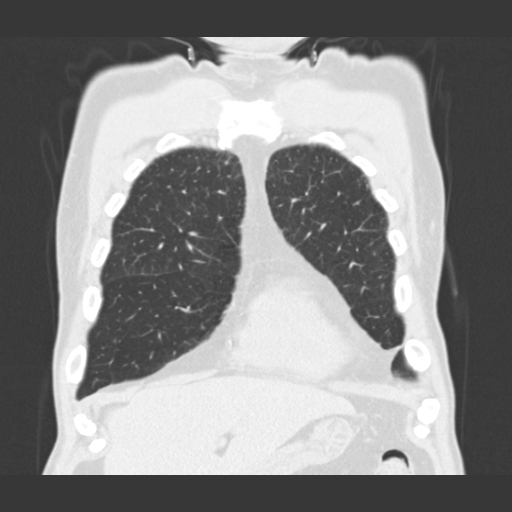
[im 50/125  lung]
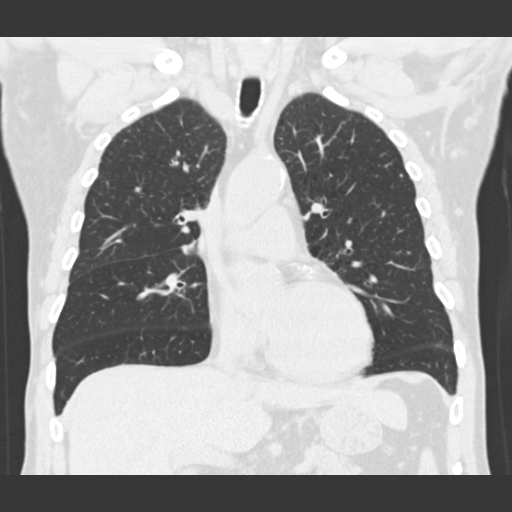
[im 75/125  lung]
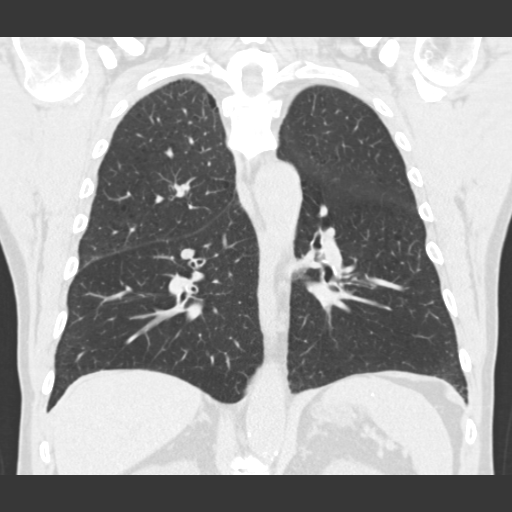

[15 of 36 positions shown; findings below may reference images not displayed]

FINDINGS: No appreciable pathologic thoracic adenopathy. Cardiac contour
normal. There is evidence of old granulomatous disease involving the
spleen.

Coronary artery atherosclerosis is present.

2 mm calcified granuloma, left upper lobe on image 20 of series 3,
no change from [DATE]. A

0.5 by 0.3 cm nodule, left lower lobe, pleural based, on image 31 of
series 3, no change from [DATE].

Tiny subpleural lymph node along the minor fissure, image 33 of
series 3, no change from [DATE].

No new nodules. Paraseptal emphysema noted. Bilateral mild airway
thickening observed. Thoracic spondylosis is present. Ectatic
ascending aorta at 4 cm, stable.
IMPRESSION: 1. Visualized small lung nodules are benign based on current
consensus criteria and do not require further workup.
2. Emphysema with airway thickening.
3. Stable ectatic ascending thoracic aorta.

## 2013-10-01 NOTE — Progress Notes (Signed)
Quick Note:  Advised pt of CT results per CY. Pt verbalized understanding. Pt has no further questions or concerns at this time ______

## 2013-10-07 ENCOUNTER — Ambulatory Visit (INDEPENDENT_AMBULATORY_CARE_PROVIDER_SITE_OTHER): Payer: 59 | Admitting: Internal Medicine

## 2013-10-07 ENCOUNTER — Encounter: Payer: Self-pay | Admitting: Internal Medicine

## 2013-10-07 VITALS — BP 122/80 | HR 90 | Ht 67.0 in | Wt 187.8 lb

## 2013-10-07 DIAGNOSIS — J449 Chronic obstructive pulmonary disease, unspecified: Secondary | ICD-10-CM

## 2013-10-07 DIAGNOSIS — R911 Solitary pulmonary nodule: Secondary | ICD-10-CM

## 2013-10-07 NOTE — Patient Instructions (Signed)
Flu v ax  Return in 2 weeks for Pneumonia vax Conjugate 13   Clearance for Knee replacement

## 2013-10-07 NOTE — Assessment & Plan Note (Signed)
I think he is at baseline. He is as good as he is likely to get for anticipated anesthesia and knee replacement surgery. He understands he is at some increased risk because of his lung disease, but I expect him to get through okay. I discussed our limitations at this assessment. Pulmonary consultation will be available if needed. Plan-clear pulmonary status for necessary surgery, flu vaccine, return in 2 weeks for pneumococcal conjugate-13 vaccine

## 2013-10-07 NOTE — Assessment & Plan Note (Signed)
Concerning nodule not visualized

## 2013-10-07 NOTE — Progress Notes (Signed)
02/20/12- 67 yoM former smoker referred by Mathews Robinsons, PA-C/ [facility] because of COPD. Spirometry 01/24/12- FVC 2.24/ 64%, FEV1 1.20/ 42%, FEV1/FVC 0.53  He considers himself normally an active and athletic person. In December of 2012 he became aware of shortness of breath walking 100 yards. This is either new or not noticed previously. He had some prednisone left over from an orthopedic visit so he tried that with no effect. In January he went to urgent care where he was diagnosed with pneumonia and treated with Levaquin x10 days with no effect. He denies fever, cough or phlegm. He was given a repeat prednisone taper from 60 mg which seemed to help modestly. Prednisone caused weight gain and nervousness but did seem to make a big difference after a third round, which ended 3 weeks ago.Marland Kitchen He says he is now breathing "phenomenally better". He is still not back to normal exercise tolerance. He notices wheeze when he is short of breath with exertion. He was given Spiriva but decided not to try until he was seen here because he has a history of urinary outlet obstruction. He was also given a sample of Dulera but uncertain effect. For 4 or 5 years he has used a Primatene inhaler for morning shortness of breath occasionally. He did not find a rescue albuterol inhaler as effective. He doesn't know about past history of pneumonia but denies TB exposure. There is no family history of DVT or pulmonary embolism. Mother was a smoker who died at age 8 of COPD. He denies personal history of heart disease or symptoms of chest pain or palpitation. 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    05/17/12- 67 yoM former smoker referred by Mathews Robinsons, PA-C/ [place] because of COPD. Patient states is better. c/o sob with exertion, wheezing, and cough. Denies chest pain and chest tightness.  CAT COPD assessment test-8/40. Describes the morning cough with thick white  sputum. He tried off [place] but decided it helped him. He feels his shortness of breath is almost back to his baseline of last fall. CT chest 05/02/12- reviewed with him IMPRESSION:  No evidence of pulmonary embolism.  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  Scattered atherosclerotic disease.  Question pulmonary arterial hypertension.  Minimal patchy central infiltrates in right upper right lower lobes  with a questionable 8 mm diameter stellate density versus scarring  in the right middle lobe.  If the patient has prior outside CT exams, recommend these be  obtained for comparison to establish stability.  In the absence of prior exams, recommend referral to the  [facility] for management.  Original Report Authenticated By: Burnetta Sabin, M.D.   11/04/12- 67 yoM former smoker followed for COPD, hx lung nodule. FOLLOWS FOR: sob and wheezing at times-mainly in the mornings (does physical work during the day) Declines flu vaccine. 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 Minor cough. Since his exacerbation last year he has improved significantly but still more dyspnea with exertion than he remembers. Wakes each morning with chest congestion, morning cough with clear mucus. Uses Ventolin rescue inhaler twice daily and continues Dulera 100.  Original Report Authenticated By: Arline Asp, M.D.   05/06/13- 67 yoM former smoker followed for COPD, hx lung nodule. FOLLOWS FOR: most every morning has congestion and able to bring up clear/white phlegm. Never  fully recovered exercise tolerance after viral illness in 2013 but proud to be physically active at work. CXR 02/02/13 IMPRESSION:  Apparent interval resolution of previously seen right middle lobe  nodule.  Stable evidence of emphysema.  No new acute abnormality or other abnormality.  Stable ascending aneurysmal ectasia.   Original Report Authenticated By: Harrel Lemon, M.D.  10/07/13- 67 yoM former smoker followed for COPD, hx lung nodule. FOLLOWS FOR: went back on Dulera-uses 1-2 puffs qd instead and is cheaper than using Breo daily-couldn't tell much difference Denies chest pain, bloody or purulent sputum. Comfortable with his medications. We discussed flu vaccine and pneumococcal vaccine. CT chest indicated small lung nodules are likely benign and can be followed conservatively. There is some emphysema and airway changes of chronic bronchitis. He has continued to work. Activities limited by pain in his knee and he is now pending knee replacement. PFT: 03/19/2012-moderate obstructive airways disease, insignificant response to bronchodilator , air trapping,normal diffusion capacity. Loop contour suggests emphysema. FVC 3.73/91%, FEV1 2.07/73%, FEV1/FEC 0.56. TLC 114%, RV 138% , DLCO 90%. CT chest 10/01/13 IMPRESSION:  1. Visualized small lung nodules are benign based on current  consensus criteria and do not require further workup.  2. Emphysema with airway thickening.  3. Stable ectatic ascending thoracic aorta.  Electronically Signed  By: Herbie Baltimore M.D.  On: 10/01/2013 12:03  ROS-see HPI Constitutional:   No-   weight loss, night sweats, fevers, chills, fatigue, lassitude. HEENT:   No-  headaches, difficulty swallowing, tooth/dental problems, sore throat,       No-  sneezing, itching, ear ache, nasal congestion, post nasal drip,  CV:  No-   chest pain, orthopnea, PND, swelling in lower extremities, anasarca,  dizziness, palpitations Resp: +   shortness of breath with exertion or at rest.              +  productive cough,  No non-productive cough,  No- coughing up of blood.              No-   change in color of mucus.  No- wheezing.   Skin: No-   rash or lesions. GI:  No-   heartburn, indigestion, abdominal pain, nausea, vomiting GU: No-   dysuria,. MS:  +  joint pain or swelling.  +  decreased range of motion.  +- back pain. Neuro-     nothing unusual Psych:  No- change in mood or affect. No depression or anxiety.  No memory loss.  OBJ- Physical Exam General- Alert, Oriented, Affect-appropriate, Distress- none acute Skin- rash-none, lesions- none, excoriation- none Lymphadenopathy- none Head- atraumatic            Eyes- Gross vision intact, PERRLA, conjunctivae and secretions clear            Ears- Hearing, canals-normal            Nose- Clear, no-Septal dev, mucus, polyps, erosion, perforation             Throat- Mallampati II , mucosa clear , drainage- none, tonsils- atrophic Neck- flexible , trachea midline, no stridor , thyroid nl, carotid no bruit Chest - symmetrical excursion , unlabored           Heart/CV- RRR , no murmur , no gallop  , no rub, nl s1 s2                           - JVD- none , edema- none,  stasis changes- none, varices- none           Lung- distant/clear, no wheeze, cough- none , dullness-none, rub- none           Chest wall-  Abd-  Br/ Gen/ Rectal- Not done, not indicated Extrem- cyanosis- none, clubbing, none, atrophy- none, strength- nl.  Neuro- grossly intact to observation

## 2013-10-20 ENCOUNTER — Ambulatory Visit: Payer: 59 | Admitting: Family Medicine

## 2013-10-22 ENCOUNTER — Ambulatory Visit: Payer: 59

## 2013-11-03 ENCOUNTER — Encounter: Payer: Self-pay | Admitting: Family Medicine

## 2013-11-03 ENCOUNTER — Ambulatory Visit (INDEPENDENT_AMBULATORY_CARE_PROVIDER_SITE_OTHER): Payer: 59 | Admitting: Family Medicine

## 2013-11-03 VITALS — BP 126/80 | HR 82 | Temp 98.3°F | Ht 66.0 in | Wt 180.5 lb

## 2013-11-03 DIAGNOSIS — Z01818 Encounter for other preprocedural examination: Secondary | ICD-10-CM

## 2013-11-03 DIAGNOSIS — K759 Inflammatory liver disease, unspecified: Secondary | ICD-10-CM | POA: Insufficient documentation

## 2013-11-03 DIAGNOSIS — I712 Thoracic aortic aneurysm, without rupture: Secondary | ICD-10-CM | POA: Insufficient documentation

## 2013-11-03 DIAGNOSIS — D4959 Neoplasm of unspecified behavior of other genitourinary organ: Secondary | ICD-10-CM

## 2013-11-03 DIAGNOSIS — I2510 Atherosclerotic heart disease of native coronary artery without angina pectoris: Secondary | ICD-10-CM

## 2013-11-03 DIAGNOSIS — I700 Atherosclerosis of aorta: Secondary | ICD-10-CM | POA: Insufficient documentation

## 2013-11-03 DIAGNOSIS — Z23 Encounter for immunization: Secondary | ICD-10-CM

## 2013-11-03 DIAGNOSIS — I77810 Thoracic aortic ectasia: Secondary | ICD-10-CM | POA: Insufficient documentation

## 2013-11-03 DIAGNOSIS — J449 Chronic obstructive pulmonary disease, unspecified: Secondary | ICD-10-CM

## 2013-11-03 HISTORY — DX: Thoracic aortic ectasia: I77.810

## 2013-11-03 NOTE — Progress Notes (Signed)
Date:  11/03/2013   Name:  Kent Flowers   DOB:  [birth year]   MRN:  [id number] Gender: male Age: 67 y.o.  Primary Physician: Hannah Beat, MD  Dear Dr. Thurston Hole:  Thank you for having me see [name] in consultation today at [facility] at [facility] for his preoperative evaluation.  As you may recall, he is a 67 y.o. year old male with a history of severe right knee osteoarthritis, knee scheduled for a right total arthroplasty upcoming in December.  From a medical standpoint, his primary problem is COPD, and he has a significant smoking history. He is moderately compliant with using his Dulera, but he is actually recently be seen by Dr. Maple Hudson, and felt like he was stable for surgery from a pulmonary standpoint.  Otherwise, he has historically been quite healthy. He did tell me that he was diagnosed with hepatitis at some point in the 1970s, but he does not know the type, and is really never had this followed up at all.  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   Also on review of his record, he does have some abnormalities noted on some of his chest CTs that have been followed by pulmonary including a thoracic aortic aneurysm, as well as calcification in the coronary arteries as well as the aorta.  He is able to achieve above a 4 met equivalent and exercise without any sort of difficulty or chest pain. He has never had any known coronary event.  Past Medical History  Diagnosis Date  . Penile neoplasm   . COPD (chronic obstructive pulmonary disease) with emphysema     PULMOLOGIST-- DR Joni Fears YOUNG  . Ascending aortic aneurysm     STABLE PER CHEST CT 09-26-2012  (4.3CM x 4.3CM)  . Chronic low back pain   . Arthritis   . Knee pain, bilateral     INTERMITTENT--  MENISCUS  . 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 Aortic calcification 11/03/2013    Past Surgical History  Procedure Laterality Date  . Tonsillectomy  AS CHILD  . Shoulder  arthroscopy with rotator cuff repair and subacromial decompression Left 2004  . Reattachment left index finger  1988  . Penile biopsy N/A 03/14/2013    Procedure: PENILE BIOPSY;  Surgeon: Antony Haste, MD;  Location: [facility];  Service: Urology;  Laterality: N/A;    History   Social History  . Marital Status: Married    Spouse Name: N/A    Number of Children: 4  . Years of Education: N/A   Occupational History  . [employer]    Social History Main Topics  . Smoking status: Former Smoker -- 1.00 packs/day for 12 years    Types: Cigarettes    Quit date: 12/18/2009  . Smokeless tobacco: Never Used     Comment: HAD SMOKED OFF AND ON OVER 25 YR SPAN--  TOTAL 12 YRS  . Alcohol Use: 1.8 oz/week    3 Glasses of wine per week  . Drug Use: No  . Sexual Activity: None   Other Topics Concern  . None   Social History Narrative  . None    Family History  Problem Relation Age of Onset  . Emphysema Mother   . Cancer Father     bile duct    Medications and Allergies reviewed  Review of Systems:    GEN: No fevers, chills. Nontoxic. Primarily MSK c/o today. MSK: Detailed in the  HPI GI: tolerating PO intake without difficulty Neuro: No numbness, parasthesias, or tingling associated. Otherwise, the pertinent positives and negatives are listed above and in the HPI, otherwise a full review of systems has been reviewed and is negative unless noted positive.   Physical Examination: BP 126/80  Pulse 82  Temp(Src) 98.3 F (36.8 C) (Oral)  Ht 5\' 6"  (1.676 m)  Wt 180 lb 8 oz (81.874 kg)  BMI 29.15 kg/m2  SpO2 95%    GEN: WDWN, NAD, Non-toxic, A & O x 3 HEENT: Atraumatic, Normocephalic. Neck supple. No masses, No LAD. Ears and Nose: No external deformity. CV: RRR, No M/G/R. No JVD. No thrill. No extra heart sounds. PULM: CTA B, no wheezes, crackles, rhonchi. No retractions. No resp. distress. No accessory muscle use. EXTR: No c/c/e NEURO Normal  gait.  PSYCH: Normally interactive. Conversant. Not depressed or anxious appearing.  Calm demeanor.    Objective Data: Comprehensive Metabolic Panel:    Component Value Date/Time   NA 139 01/24/2012 1735   K 4.5 01/24/2012 1735   CL 100 01/24/2012 1735   CO2 29 01/24/2012 1735   BUN 24* 01/24/2012 1735   CREATININE 1.12 01/24/2012 1735   GLUCOSE 96 01/24/2012 1735   CALCIUM 9.9 01/24/2012 1735   AST 25 01/24/2012 1735   ALT 29 01/24/2012 1735   ALKPHOS 83 01/24/2012 1735   BILITOT 0.5 01/24/2012 1735   PROT 7.1 01/24/2012 1735   ALBUMIN 4.7 01/24/2012 1735   CBC:    Component Value Date/Time   WBC 8.3 01/24/2012 1741   HGB 16.0 03/14/2013 0910   HGB 15.0 01/24/2012 1741   HCT 46.5 01/24/2012 1741   MCV 97.0 01/24/2012 1741     Assessment and Plan: Preoperative clearance  Hepatitis - Plan: Hepatitis B core antibody, IgM, Hepatitis B surface antibody, Hepatitis B surface antigen, Hepatitis C antibody  COPD (chronic obstructive pulmonary disease)  Need for prophylactic vaccination against Streptococcus pneumoniae (pneumococcus) - Plan: Pneumococcal polysaccharide vaccine 23-valent greater than or equal to 2yo subcutaneous/IM  Ascending aortic aneurysm  Ectatic thoracic aorta  Aortic calcification  Coronary atherosclerosis of native coronary artery  Penile neoplasm   Recommendations: He tells me that he is going to have some upcoming blood work from a preoperative standpoint from your office. It certainly would be appropriate to obtain study such as a BMP, CBC and hepatic function panel.  Today, I am going to order a hepatitis panel to determine to see if he indeed has hepatitis B or C.  Overall, he is stable. From a pulmonary standpoint, he was instructed to continue using his Dulera 2 puffs twice a day until surgery.  Potential benefits outweigh potential risks in this case.  Thank you for having [place] see [name] in consultation.  Feel free to contact me with any questions.  Updated  Medication List:   Medication List       This list is accurate as of: 11/03/13  3:01 PM.  Always use your most recent med list.               albuterol 108 (90 BASE) MCG/ACT inhaler  Commonly known as:  PROVENTIL HFA;VENTOLIN HFA  Inhale 1 puff into the lungs every 4 (four) hours as needed.     mometasone-formoterol 200-5 MCG/ACT Aero  Commonly known as:  DULERA  Inhale 2 puffs into the lungs 2 (two) times daily.     Oxycodone HCl 10 MG Tabs  Take 10 mg by mouth 3 (  three) times daily. OXY - IR     ZIPSOR 25 MG Caps  Generic drug:  Diclofenac Potassium  Take 1 tablet by mouth 4 (four) times daily as needed. Normally takes around 3-4 pills a week        Signed,  Karleen Hampshire T. Jaretzy Lhommedieu, MD, CAQ Sports Medicine  [employer] at [facility] [address] [place], [place] [address] Phone: [phone] Fax: [phone]

## 2013-11-03 NOTE — Progress Notes (Signed)
Pre-visit discussion using our clinic review tool. No additional management support is needed unless otherwise documented below in the visit note.  

## 2013-11-04 LAB — HEPATITIS B SURFACE ANTIGEN: Hepatitis B Surface Ag: NEGATIVE

## 2013-11-04 LAB — HEPATITIS C ANTIBODY: HCV Ab: NEGATIVE

## 2013-11-04 LAB — HEPATITIS B SURFACE ANTIBODY,QUALITATIVE: Hep B S Ab: NEGATIVE

## 2013-11-04 LAB — HEPATITIS B CORE ANTIBODY, IGM: Hep B C IgM: NONREACTIVE

## 2013-11-17 ENCOUNTER — Encounter: Payer: Self-pay | Admitting: Physician Assistant

## 2013-11-17 ENCOUNTER — Other Ambulatory Visit: Payer: Self-pay | Admitting: Physician Assistant

## 2013-11-17 ENCOUNTER — Other Ambulatory Visit (HOSPITAL_COMMUNITY): Payer: 59

## 2013-11-17 DIAGNOSIS — J439 Emphysema, unspecified: Secondary | ICD-10-CM

## 2013-11-17 DIAGNOSIS — G8929 Other chronic pain: Secondary | ICD-10-CM

## 2013-11-17 DIAGNOSIS — M1711 Unilateral primary osteoarthritis, right knee: Secondary | ICD-10-CM

## 2013-11-17 DIAGNOSIS — J449 Chronic obstructive pulmonary disease, unspecified: Secondary | ICD-10-CM | POA: Insufficient documentation

## 2013-11-17 DIAGNOSIS — M545 Low back pain, unspecified: Secondary | ICD-10-CM | POA: Insufficient documentation

## 2013-11-17 DIAGNOSIS — M25561 Pain in right knee: Secondary | ICD-10-CM | POA: Insufficient documentation

## 2013-11-17 NOTE — H&P (Signed)
TOTAL KNEE ADMISSION H&P  Patient is being admitted for right total knee arthroplasty.  Subjective:  Chief Complaint:right knee pain.  HPI: Kent Flowers, 67 y.o. male, has a history of pain and functional disability in the right knee due to arthritis and has failed non-surgical conservative treatments for greater than 12 weeks to includeNSAID's and/or analgesics, corticosteriod injections, viscosupplementation injections, flexibility and strengthening excercises, supervised PT with diminished ADL's post treatment, use of assistive devices, weight reduction as appropriate and activity modification.  Onset of symptoms was gradual, starting >10 years ago with gradually worsening course since that time. The patient noted no past surgery on the right knee(s).  Patient currently rates pain in the right knee(s) at 10 out of 10 with activity. Patient has night pain, worsening of pain with activity and weight bearing, pain that interferes with activities of daily living, crepitus and joint swelling.  Patient has evidence of subchondral sclerosis, periarticular osteophytes and joint space narrowing by imaging studies.  There is no active infection.  Patient Active Problem List   Diagnosis Date Noted  . COPD (chronic obstructive pulmonary disease) with emphysema   . Chronic low back pain   . Knee pain, bilateral   . Right knee DJD   . Hepatitis 11/03/2013  . 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 Aortic calcification 11/03/2013  . Coronary atherosclerosis of native coronary artery 11/03/2013  . Penile neoplasm   . Ascending aortic aneurysm   . Lung nodule 05/23/2012  . COPD (chronic obstructive pulmonary disease) 02/24/2012   Past Medical History  Diagnosis Date  . Penile neoplasm 2014  . COPD (chronic obstructive pulmonary disease) with emphysema     PULMOLOGIST-- DR Joni Fears YOUNG  . Ascending aortic aneurysm     STABLE PER CHEST CT 09-26-2012  (4.3CM x 4.3CM)  . Chronic low back pain   .  Arthritis   . Knee pain, bilateral     INTERMITTENT--  MENISCUS  . 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 Aortic calcification 11/03/2013  . Right knee DJD     Past Surgical History  Procedure Laterality Date  . Tonsillectomy  AS CHILD  . Shoulder arthroscopy with rotator cuff repair and subacromial decompression Left 2004  . Reattachment left index finger  1988  . Penile biopsy N/A 03/14/2013    Procedure: PENILE BIOPSY;  Surgeon: Antony Haste, MD;  Location: [facility];  Service: Urology;  Laterality: N/A;     (Not in a hospital admission) Allergies  Allergen Reactions  . Ceclor [Cefaclor] Rash  . Sulfa Antibiotics Rash    SEVERE RASH, Childhood allergy    Current Outpatient Prescriptions on File Prior to Visit  Medication Sig Dispense Refill  . albuterol (PROVENTIL HFA;VENTOLIN HFA) 108 (90 BASE) MCG/ACT inhaler Inhale 2 puffs into the lungs every 4 (four) hours as needed.       . mometasone-formoterol (DULERA) 200-5 MCG/ACT AERO Inhale 2 puffs into the lungs 2 (two) times daily.  13 g  2  . Oxycodone HCl 10 MG TABS Take 10 mg by mouth 3 (three) times daily. OXY - IR      . ZIPSOR 25 MG CAPS Take 1 tablet by mouth 4 (four) times daily as needed. Normally takes around 3-4 pills a week       No current facility-administered medications on file prior to visit.   History  Substance Use Topics  . Smoking status: Former Smoker -- 1.00 packs/day for 12 years    Types: Cigarettes  Quit date: 12/18/2009  . Smokeless tobacco: Never Used     Comment: HAD SMOKED OFF AND ON OVER 25 YR SPAN--  TOTAL 12 YRS  . Alcohol Use: 1.8 oz/week    3 Glasses of wine per week     Comment: 3/4 of a bottle of wine a day    Family History  Problem Relation Age of Onset  . Emphysema Mother   . Cancer Father     bile duct     Review of Systems  Constitutional: Negative.   HENT: Negative.   Eyes: Negative.   Respiratory: Negative.   Cardiovascular: Negative.    Gastrointestinal: Negative.   Genitourinary: Negative.   Musculoskeletal: Positive for back pain and joint pain.  Skin: Negative.   Neurological: Negative.   Endo/Heme/Allergies: Negative.   Psychiatric/Behavioral: Negative.     Objective:  Physical Exam  Constitutional: He is oriented to person, place, and time. He appears well-developed and well-nourished.  HENT:  Head: Normocephalic and atraumatic.  Eyes: Conjunctivae and EOM are normal. Pupils are equal, round, and reactive to light.  Neck: Neck supple.  Cardiovascular: Normal rate and regular rhythm.   Respiratory: Effort normal.  GI: Soft.  Genitourinary:  Not pertinent to current symptomatology therefore not examined.  Musculoskeletal:  Examination of both knees reveals moderate varus deformity with pain medially. 1+ crepitation 1+ synovitis range of motion -5 to 125 degrees both knees are stable with normal patella tracking. Vascular exam: pulses 2+ and symmetric.  Neurological: He is alert and oriented to person, place, and time.  Skin: Skin is warm and dry.  Psychiatric: He has a normal mood and affect. His behavior is normal.    Vital signs in last 24 hours: Last recorded: 12/01 1300   BP: 156/79 Pulse: 82  Temp: 98.6 F (37 C)    Height: 5\' 6"  (1.676 m) SpO2: 96  Weight: 83.008 kg (183 lb)     Labs:   Estimated body mass index is 29.55 kg/(m^2) as calculated from the following:   Height as of this encounter: 5\' 6"  (1.676 m).   Weight as of this encounter: 83.008 kg (183 lb).   Imaging Review Plain radiographs demonstrate severe degenerative joint disease of the right knee(s). The overall alignment ismild varus. The bone quality appears to be good for age and reported activity level.  Assessment/Plan:  End stage arthritis, right knee   The patient history, physical examination, clinical judgment of the provider and imaging studies are consistent with end stage degenerative joint disease of the right  knee(s) and total knee arthroplasty is deemed medically necessary. The treatment options including medical management, injection therapy arthroscopy and arthroplasty were discussed at length. The risks and benefits of total knee arthroplasty were presented and reviewed. The risks due to aseptic loosening, infection, stiffness, patella tracking problems, thromboembolic complications and other imponderables were discussed. The patient acknowledged the explanation, agreed to proceed with the plan and consent was signed. Patient is being admitted for inpatient treatment for surgery, pain control, PT, OT, prophylactic antibiotics, VTE prophylaxis, progressive ambulation and ADL's and discharge planning. The patient is planning to be discharged home with home health services  Avaiah Stempel A. Gwinda Passe Physician Assistant [facility] [phone]  11/17/2013, 2:50 PM

## 2013-11-18 ENCOUNTER — Inpatient Hospital Stay (HOSPITAL_COMMUNITY)
Admission: RE | Admit: 2013-11-18 | Discharge: 2013-11-18 | Disposition: A | Discharge status: Home / Self Care | Payer: 59 | Source: Ambulatory Visit

## 2013-11-18 NOTE — Pre-Procedure Instructions (Signed)
Kent Flowers [facility]  11/18/2013   Your procedure is scheduled on:  11/24/13  Report to [facility]  2 * 3 at 1015 AM.  Call this number if you have problems the morning of surgery: [phone]   Remember:   Do not eat food or drink liquids after midnight.   Take these medicines the morning of surgery with A SIP OF WATER: all inhalers,oxycodone   Do not wear jewelry, make-up or nail polish.  Do not wear lotions, powders, or perfumes. You may wear deodorant.  Do not shave 48 hours prior to surgery. Men may shave face and neck.  Do not bring valuables to the hospital.  [facility] is not responsible                  for any belongings or valuables.               Contacts, dentures or bridgework may not be worn into surgery.  Leave suitcase in the car. After surgery it may be brought to your room.  For patients admitted to the hospital, discharge time is determined by your                treatment team.               Patients discharged the day of surgery will not be allowed to drive  home.  Name and phone number of your driver: family  Special Instructions: Incentive Spirometry - Practice and bring it with you on the day of surgery.   Please read over the following fact sheets that you were given: Pain Booklet, Coughing and Deep Breathing, Blood Transfusion Information, MRSA Information and Surgical Site Infection Prevention

## 2013-11-24 ENCOUNTER — Encounter (HOSPITAL_COMMUNITY): Admission: RE | Payer: Self-pay | Source: Ambulatory Visit

## 2013-11-24 ENCOUNTER — Inpatient Hospital Stay (HOSPITAL_COMMUNITY): Admission: RE | Admit: 2013-11-24 | Payer: 59 | Source: Ambulatory Visit | Admitting: Orthopedic Surgery

## 2013-11-24 SURGERY — ARTHROPLASTY, KNEE, TOTAL
Anesthesia: General | Laterality: Right

## 2013-12-09 ENCOUNTER — Telehealth: Payer: Self-pay | Admitting: Internal Medicine

## 2013-12-09 MED ORDER — OSELTAMIVIR PHOSPHATE 75 MG PO CAPS: 75.0000 mg | ORAL_CAPSULE | Freq: Two times a day (BID) | ORAL | Status: DC

## 2013-12-09 MED ORDER — PROMETHAZINE-CODEINE 6.25-10 MG/5ML PO SYRP: 5.0000 mL | ORAL_SOLUTION | Freq: Four times a day (QID) | ORAL | Status: DC | PRN

## 2013-12-09 NOTE — Telephone Encounter (Signed)
Pt is aware of recs. rx printed off. Nothing further needed

## 2013-12-09 NOTE — Telephone Encounter (Signed)
Offer tamiflu 75 mg # 10, 1 twice daily          prometh codeine cough syrup  200 ml, 1 tsp every 6 hours if needed for cough  Push fluids

## 2013-12-09 NOTE — Telephone Encounter (Signed)
Spoke with pt. Reports cough with production of clear mucus, chest congestion and SOB x3 days. States, " My lungs feel like they are on fire." Has tried Mucinex with minimal relief. Would like something called in.  Allergies  Allergen Reactions  . Ceclor [Cefaclor] Rash  . Sulfa Antibiotics Rash    SEVERE RASH, Childhood allergy    Current Outpatient Prescriptions on File Prior to Visit  Medication Sig Dispense Refill  . albuterol (PROVENTIL HFA;VENTOLIN HFA) 108 (90 BASE) MCG/ACT inhaler Inhale 2 puffs into the lungs every 4 (four) hours as needed.       . mometasone-formoterol (DULERA) 200-5 MCG/ACT AERO Inhale 2 puffs into the lungs 2 (two) times daily.  13 g  2  . Oxycodone HCl 10 MG TABS Take 10 mg by mouth 3 (three) times daily. OXY - IR      . ZIPSOR 25 MG CAPS Take 1 tablet by mouth 4 (four) times daily as needed. Normally takes around 3-4 pills a week       No current facility-administered medications on file prior to visit.    CY - please advise. Thanks.

## 2013-12-23 ENCOUNTER — Other Ambulatory Visit: Payer: Self-pay | Admitting: Internal Medicine

## 2014-02-05 ENCOUNTER — Telehealth: Payer: Self-pay

## 2014-02-05 NOTE — Telephone Encounter (Signed)
Pt left v/m requesting cb; spoke with pt; pt received bill from [facility] about experimental lab test done on 11/03/13. Pt is in the car now and does not know name of test; pt said his wife is talking with [facility] about this; pt will cb if needed.

## 2014-02-06 ENCOUNTER — Encounter: Payer: Self-pay | Admitting: Internal Medicine

## 2014-02-06 ENCOUNTER — Ambulatory Visit (INDEPENDENT_AMBULATORY_CARE_PROVIDER_SITE_OTHER): Payer: 59 | Admitting: Internal Medicine

## 2014-02-06 ENCOUNTER — Telehealth: Payer: Self-pay | Admitting: Internal Medicine

## 2014-02-06 VITALS — BP 140/92 | HR 68 | Ht 67.0 in | Wt 187.6 lb

## 2014-02-06 DIAGNOSIS — J438 Other emphysema: Secondary | ICD-10-CM

## 2014-02-06 DIAGNOSIS — J449 Chronic obstructive pulmonary disease, unspecified: Secondary | ICD-10-CM

## 2014-02-06 DIAGNOSIS — J439 Emphysema, unspecified: Secondary | ICD-10-CM

## 2014-02-06 MED ORDER — LEVALBUTEROL HCL 0.63 MG/3ML IN NEBU
0.6300 mg | INHALATION_SOLUTION | Freq: Once | RESPIRATORY_TRACT | Status: AC
  Administered 2014-02-06: 0.63 mg via RESPIRATORY_TRACT

## 2014-02-06 MED ORDER — METHYLPREDNISOLONE ACETATE 80 MG/ML IJ SUSP
80.0000 mg | Freq: Once | INTRAMUSCULAR | Status: AC
  Administered 2014-02-06: 80 mg via INTRAMUSCULAR

## 2014-02-06 NOTE — Progress Notes (Signed)
02/20/12- 68 yoM former smoker referred by Mathews Robinsons, PA-C/ [facility] because of COPD. Spirometry 01/24/12- FVC 2.24/ 64%, FEV1 1.20/ 42%, FEV1/FVC 0.53  He considers himself normally an active and athletic person. In December of 2012 he became aware of shortness of breath walking 100 yards. This is either new or not noticed previously. He had some prednisone left over from an orthopedic visit so he tried that with no effect. In January he went to urgent care where he was diagnosed with pneumonia and treated with Levaquin x10 days with no effect. He denies fever, cough or phlegm. He was given a repeat prednisone taper from 60 mg which seemed to help modestly. Prednisone caused weight gain and nervousness but did seem to make a big difference after a third round, which ended 3 weeks ago.Marland Kitchen He says he is now breathing "phenomenally better". He is still not back to normal exercise tolerance. He notices wheeze when he is short of breath with exertion. He was given Spiriva but decided not to try until he was seen here because he has a history of urinary outlet obstruction. He was also given a sample of Dulera but uncertain effect. For 4 or 5 years he has used a Primatene inhaler for morning shortness of breath occasionally. He did not find a rescue albuterol inhaler as effective. He doesn't know about past history of pneumonia but denies TB exposure. There is no family history of DVT or pulmonary embolism. Mother was a smoker who died at age 8 of COPD. He denies personal history of heart disease or symptoms of chest pain or palpitation. 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    05/17/12- 68 yoM former smoker referred by Mathews Robinsons, PA-C/ [facility] because of COPD. Patient states is better. c/o sob with exertion, wheezing, and cough. Denies chest pain and chest tightness.  CAT COPD assessment test-8/40. Describes the morning cough with thick white  sputum. He tried off [place] but decided it helped him. He feels his shortness of breath is almost back to his baseline of last fall. CT chest 05/02/12- reviewed with him IMPRESSION:  No evidence of pulmonary embolism.  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  Scattered atherosclerotic disease.  Question pulmonary arterial hypertension.  Minimal patchy central infiltrates in right upper right lower lobes  with a questionable 8 mm diameter stellate density versus scarring  in the right middle lobe.  If the patient has prior outside CT exams, recommend these be  obtained for comparison to establish stability.  In the absence of prior exams, recommend referral to the  [facility] for management.  Original Report Authenticated By: Burnetta Sabin, M.D.   11/04/12- 68 yoM former smoker followed for COPD, hx lung nodule. FOLLOWS FOR: sob and wheezing at times-mainly in the mornings (does physical work during the day) Declines flu vaccine. 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 Minor cough. Since his exacerbation last year he has improved significantly but still more dyspnea with exertion than he remembers. Wakes each morning with chest congestion, morning cough with clear mucus. Uses Ventolin rescue inhaler twice daily and continues Dulera 100.  Original Report Authenticated By: Arline Asp, M.D.   05/06/13- 68 yoM former smoker followed for COPD, hx lung nodule. FOLLOWS FOR: most every morning has congestion and able to bring up clear/white phlegm. Never  fully recovered exercise tolerance after viral illness in 2013 but proud to be physically active at work. CXR 02/02/13 IMPRESSION:  Apparent interval resolution of previously seen right middle lobe  nodule.  Stable evidence of emphysema.  No new acute abnormality or other abnormality.  Stable ascending aneurysmal ectasia.   Original Report Authenticated By: Arline Asp, M.D.  10/07/13- 68 yoM former smoker followed for COPD, hx lung nodule. FOLLOWS FOR: went back on Dulera-uses 1-2 puffs qd instead and is cheaper than using Breo daily-couldn't tell much difference Denies chest pain, bloody or purulent sputum. Comfortable with his medications. We discussed flu vaccine and pneumococcal vaccine. CT chest indicated small lung nodules are likely benign and can be followed conservatively. There is some emphysema and airway changes of chronic bronchitis. He has continued to work. Activities limited by pain in his knee and he is now pending knee replacement. PFT: 03/19/2012-moderate obstructive airways disease, insignificant response to bronchodilator , air trapping,normal diffusion capacity. Loop contour suggests emphysema. FVC 3.73/91%, FEV1 2.07/73%, FEV1/FEC 0.56. TLC 114%, RV 138% , DLCO 90%. CT chest 10/01/13 IMPRESSION:  1. Visualized small lung nodules are benign based on current  consensus criteria and do not require further workup.  2. Emphysema with airway thickening.  3. Stable ectatic ascending thoracic aorta.  Electronically Signed  By: Sherryl Barters M.D.  On: 10/01/2013 12:03  02/06/14- 68 yoM former smoker followed for COPD, hx lung nodules. ACUTE VISIT:  Increased sob, wheezing and cough with clear mucus x3 weeks.  Also, increased use of rescue inhaler up to 5 times a day Never got TKR, got dental work done first. Now acute increased shortness of breath x3 weeks. Intermittent discomfort right upper anterior chest, not exertional.  ROS-see HPI Constitutional:   No-   weight loss, night sweats, fevers, chills, fatigue, lassitude. HEENT:   No-  headaches, difficulty swallowing, tooth/dental problems, sore throat,       No-  sneezing, itching, ear ache, nasal congestion, post nasal drip,  CV:  + chest pain, orthopnea, PND, swelling in lower extremities, anasarca,  dizziness, palpitations Resp: +    shortness of breath with exertion or at rest.              +  productive cough,  + non-productive cough,  No- coughing up of blood.              No-   change in color of mucus.  + wheezing.   Skin: No-   rash or lesions. GI:  No-   heartburn, indigestion, abdominal pain, nausea, vomiting GU: No-   dysuria,. MS:  +  joint pain or swelling.  + decreased range of motion.  +- back pain. Neuro-     nothing unusual Psych:  No- change in mood or affect. No depression or anxiety.  No memory loss.  OBJ- Physical Exam General- Alert, Oriented, Affect-appropriate, Distress- none acute Skin- rash-none, lesions- none, excoriation- none Lymphadenopathy- none Head- atraumatic            Eyes- Gross vision intact, PERRLA, conjunctivae and secretions clear            Ears- Hearing, canals-normal            Nose- Clear, no-Septal dev, mucus, polyps, erosion, perforation             Throat- Mallampati II , mucosa clear , drainage- none, tonsils- atrophic Neck- flexible , trachea midline, no stridor , thyroid nl, carotid no bruit Chest - symmetrical  excursion , unlabored           Heart/CV- RRR , no murmur , no gallop  , no rub, nl s1 s2                           - JVD- none , edema- none, stasis changes- none, varices- none           Lung- distant/clear,  Wheeze+bilateral, cough- none , dullness-none, rub- none           Chest wall-  Abd-  Br/ Gen/ Rectal- Not done, not indicated Extrem- cyanosis- none, clubbing, none, atrophy- none, strength- nl.  Neuro- grossly intact to observation

## 2014-02-06 NOTE — Telephone Encounter (Signed)
I called and spoke with pt. C/o sudden increase SOB at rest/exertion/worse at night x 2 weeks. Few days getting worse. He is also getting large amount of clear mucus up every morning. He is using rescue inhaler about 2-5 times everyday. He wants OV today. Does not want anything called in. Please advise Dr. Annamaria Boots thanks  Allergies  Allergen Reactions  . Ceclor [Cefaclor] Rash  . Sulfa Antibiotics Rash    SEVERE RASH, Childhood allergy

## 2014-02-06 NOTE — Telephone Encounter (Signed)
Per CY-lets have patient be here at 1:45pm for a 2:00pm work in appt with him. Thanks.

## 2014-02-06 NOTE — Telephone Encounter (Signed)
Called and spoke with pt. Aware of appt time.

## 2014-02-06 NOTE — Patient Instructions (Signed)
This is a good time to be using your [facility] maintenance inhaler regularly   2 puffs then rinse mouth, twice daily  Neb xop 0.63  Depo 80  Keep your appointment in April unless needed sooner

## 2014-03-02 ENCOUNTER — Encounter: Payer: Self-pay | Admitting: Internal Medicine

## 2014-03-02 NOTE — Assessment & Plan Note (Signed)
Acute exacerbation, possibly viral but nonspecific Plan-neb Xopenex, Depo-Medrol, medication talk

## 2014-03-13 ENCOUNTER — Encounter: Payer: Self-pay | Admitting: Radiology

## 2014-03-13 DIAGNOSIS — K759 Inflammatory liver disease, unspecified: Secondary | ICD-10-CM | POA: Insufficient documentation

## 2014-03-17 ENCOUNTER — Other Ambulatory Visit: Payer: Self-pay | Admitting: Internal Medicine

## 2014-04-07 ENCOUNTER — Encounter: Payer: Self-pay | Admitting: Internal Medicine

## 2014-04-07 ENCOUNTER — Ambulatory Visit (INDEPENDENT_AMBULATORY_CARE_PROVIDER_SITE_OTHER): Payer: 59 | Admitting: Internal Medicine

## 2014-04-07 VITALS — BP 118/74 | HR 77 | Ht 67.0 in | Wt 183.0 lb

## 2014-04-07 DIAGNOSIS — J439 Emphysema, unspecified: Secondary | ICD-10-CM

## 2014-04-07 DIAGNOSIS — R911 Solitary pulmonary nodule: Secondary | ICD-10-CM

## 2014-04-07 DIAGNOSIS — J438 Other emphysema: Secondary | ICD-10-CM

## 2014-04-07 NOTE — Patient Instructions (Signed)
We can continue present meds  Please  Call as needed

## 2014-04-07 NOTE — Progress Notes (Signed)
02/20/12- 68 yoM former smoker referred by Mathews Robinsons, PA-C/ [facility] because of COPD. Spirometry 01/24/12- FVC 2.24/ 64%, FEV1 1.20/ 42%, FEV1/FVC 0.53  He considers himself normally an active and athletic person. In December of 2012 he became aware of shortness of breath walking 100 yards. This is either new or not noticed previously. He had some prednisone left over from an orthopedic visit so he tried that with no effect. In January he went to urgent care where he was diagnosed with pneumonia and treated with Levaquin x10 days with no effect. He denies fever, cough or phlegm. He was given a repeat prednisone taper from 60 mg which seemed to help modestly. Prednisone caused weight gain and nervousness but did seem to make a big difference after a third round, which ended 3 weeks ago.Marland Kitchen He says he is now breathing "phenomenally better". He is still not back to normal exercise tolerance. He notices wheeze when he is short of breath with exertion. He was given Spiriva but decided not to try until he was seen here because he has a history of urinary outlet obstruction. He was also given a sample of Dulera but uncertain effect. For 4 or 5 years he has used a Primatene inhaler for morning shortness of breath occasionally. He did not find a rescue albuterol inhaler as effective. He doesn't know about past history of pneumonia but denies TB exposure. There is no family history of DVT or pulmonary embolism. Mother was a smoker who died at age 8 of COPD. He denies personal history of heart disease or symptoms of chest pain or palpitation. 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    05/17/12- 68 yoM former smoker referred by Mathews Robinsons, PA-C/ [place] because of COPD. Patient states is better. c/o sob with exertion, wheezing, and cough. Denies chest pain and chest tightness.  CAT COPD assessment test-8/40. Describes the morning cough with thick white  sputum. He tried off [place] but decided it helped him. He feels his shortness of breath is almost back to his baseline of last fall. CT chest 05/02/12- reviewed with him IMPRESSION:  No evidence of pulmonary embolism.  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  Scattered atherosclerotic disease.  Question pulmonary arterial hypertension.  Minimal patchy central infiltrates in right upper right lower lobes  with a questionable 8 mm diameter stellate density versus scarring  in the right middle lobe.  If the patient has prior outside CT exams, recommend these be  obtained for comparison to establish stability.  In the absence of prior exams, recommend referral to the  [facility] for management.  Original Report Authenticated By: Burnetta Sabin, M.D.   11/04/12- 68 yoM former smoker followed for COPD, hx lung nodule. FOLLOWS FOR: sob and wheezing at times-mainly in the mornings (does physical work during the day) Declines flu vaccine. 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 Minor cough. Since his exacerbation last year he has improved significantly but still more dyspnea with exertion than he remembers. Wakes each morning with chest congestion, morning cough with clear mucus. Uses Ventolin rescue inhaler twice daily and continues Dulera 100.  Original Report Authenticated By: Arline Asp, M.D.   05/06/13- 68 yoM former smoker followed for COPD, hx lung nodule. FOLLOWS FOR: most every morning has congestion and able to bring up clear/white phlegm. Never  fully recovered exercise tolerance after viral illness in 2013 but proud to be physically active at work. CXR 02/02/13 IMPRESSION:  Apparent interval resolution of previously seen right middle lobe  nodule.  Stable evidence of emphysema.  No new acute abnormality or other abnormality.  Stable ascending aneurysmal ectasia.   Original Report Authenticated By: Arline Asp, M.D.  10/07/13- 68 yoM former smoker followed for COPD, hx lung nodule. FOLLOWS FOR: went back on Dulera-uses 1-2 puffs qd instead and is cheaper than using Breo daily-couldn't tell much difference Denies chest pain, bloody or purulent sputum. Comfortable with his medications. We discussed flu vaccine and pneumococcal vaccine. CT chest indicated small lung nodules are likely benign and can be followed conservatively. There is some emphysema and airway changes of chronic bronchitis. He has continued to work. Activities limited by pain in his knee and he is now pending knee replacement. PFT: 03/19/2012-moderate obstructive airways disease, insignificant response to bronchodilator , air trapping,normal diffusion capacity. Loop contour suggests emphysema. FVC 3.73/91%, FEV1 2.07/73%, FEV1/FEC 0.56. TLC 114%, RV 138% , DLCO 90%. CT chest 10/01/13 IMPRESSION:  1. Visualized small lung nodules are benign based on current  consensus criteria and do not require further workup.  2. Emphysema with airway thickening.  3. Stable ectatic ascending thoracic aorta.  Electronically Signed  By: Sherryl Barters M.D.  On: 10/01/2013 12:03  02/06/14- 68 yoM former smoker followed for COPD, hx lung nodules. ACUTE VISIT:  Increased sob, wheezing and cough with clear mucus x3 weeks.  Also, increased use of rescue inhaler up to 5 times a day Never got TKR, got dental work done first. Now acute increased shortness of breath x3 weeks. Intermittent discomfort right upper anterior chest, not exertional.  04/07/14- 67 yoM former smoker followed for COPD, hx lung nodules. FOLLOWS FOR: Pt states he feels better since last visit-no more flare ups or trouble breathing. Doing very well, using rescue inhaler once a day, continues Dulera  ROS-see HPI Constitutional:   No-   weight loss, night sweats, fevers, chills, fatigue, lassitude. HEENT:   No-  headaches, difficulty  swallowing, tooth/dental problems, sore throat,       No-  sneezing, itching, ear ache, nasal congestion, post nasal drip,  CV:  No- chest pain, orthopnea, PND, swelling in lower extremities, anasarca,  dizziness, palpitations Resp: +   shortness of breath with exertion or at rest.              No- productive cough,no- non-productive cough,  No- coughing up of blood.              No-   change in color of mucus.  + wheezing.   Skin: No-   rash or lesions. GI:  No-   heartburn, indigestion, abdominal pain, nausea, vomiting GU: No-   dysuria,. MS:  +  joint pain or swelling.  + decreased range of motion.  +- back pain. Neuro-     nothing unusual Psych:  No- change in mood or affect. No depression or anxiety.  No memory loss.  OBJ- Physical Exam General- Alert, Oriented, Affect-appropriate, Distress- none acute Skin- rash-none, lesions- none, excoriation- none Lymphadenopathy- none Head- atraumatic            Eyes- Gross vision intact, PERRLA, conjunctivae and secretions clear            Ears- Hearing, canals-normal            Nose- Clear, no-Septal dev, mucus, polyps, erosion, perforation  Throat- Mallampati II , mucosa clear , drainage- none, tonsils- atrophic Neck- flexible , trachea midline, no stridor , thyroid nl, carotid no bruit Chest - symmetrical excursion , unlabored           Heart/CV- RRR , no murmur , no gallop  , no rub, nl s1 s2                           - JVD- none , edema- none, stasis changes- none, varices- none           Lung- distant/clear,  Wheeze-none, cough- none , dullness-none, rub- none, +raspy laugh           Chest wall-  Abd-  Br/ Gen/ Rectal- Not done, not indicated Extrem- cyanosis- none, clubbing, none, atrophy- none, strength- nl.  Neuro- grossly intact to observation

## 2014-05-05 NOTE — Assessment & Plan Note (Addendum)
Near baseline and hopefully can avoid acute episodes through the summer. Plan-reviewed medications and answer questions

## 2014-05-05 NOTE — Assessment & Plan Note (Signed)
Plan occasional chest x-ray as part of routine care

## 2014-06-29 ENCOUNTER — Ambulatory Visit (INDEPENDENT_AMBULATORY_CARE_PROVIDER_SITE_OTHER): Payer: 59 | Admitting: Internal Medicine

## 2014-06-29 ENCOUNTER — Encounter: Payer: Self-pay | Admitting: Internal Medicine

## 2014-06-29 VITALS — BP 140/96 | HR 89 | Temp 98.4°F | Ht 67.0 in | Wt 180.2 lb

## 2014-06-29 DIAGNOSIS — J438 Other emphysema: Secondary | ICD-10-CM

## 2014-06-29 DIAGNOSIS — J209 Acute bronchitis, unspecified: Secondary | ICD-10-CM

## 2014-06-29 DIAGNOSIS — J432 Centrilobular emphysema: Secondary | ICD-10-CM

## 2014-06-29 DIAGNOSIS — J440 Chronic obstructive pulmonary disease with acute lower respiratory infection: Secondary | ICD-10-CM

## 2014-06-29 MED ORDER — AZITHROMYCIN 250 MG PO TABS: ORAL_TABLET | ORAL | Status: DC

## 2014-06-29 NOTE — Progress Notes (Signed)
02/20/12- 68 yoM former smoker referred by Mathews Robinsons, PA-C/ [facility] because of COPD. Spirometry 01/24/12- FVC 2.24/ 64%, FEV1 1.20/ 42%, FEV1/FVC 0.53  He considers himself normally an active and athletic person. In December of 2012 he became aware of shortness of breath walking 100 yards. This is either new or not noticed previously. He had some prednisone left over from an orthopedic visit so he tried that with no effect. In January he went to urgent care where he was diagnosed with pneumonia and treated with Levaquin x10 days with no effect. He denies fever, cough or phlegm. He was given a repeat prednisone taper from 60 mg which seemed to help modestly. Prednisone caused weight gain and nervousness but did seem to make a big difference after a third round, which ended 3 weeks ago.Marland Kitchen He says he is now breathing "phenomenally better". He is still not back to normal exercise tolerance. He notices wheeze when he is short of breath with exertion. He was given Spiriva but decided not to try until he was seen here because he has a history of urinary outlet obstruction. He was also given a sample of Dulera but uncertain effect. For 4 or 5 years he has used a Primatene inhaler for morning shortness of breath occasionally. He did not find a rescue albuterol inhaler as effective. He doesn't know about past history of pneumonia but denies TB exposure. There is no family history of DVT or pulmonary embolism. Mother was a smoker who died at age 8 of COPD. He denies personal history of heart disease or symptoms of chest pain or palpitation. 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    05/17/12- 68 yoM former smoker referred by Mathews Robinsons, PA-C/ [facility] because of COPD. Patient states is better. c/o sob with exertion, wheezing, and cough. Denies chest pain and chest tightness.  CAT COPD assessment test-8/40. Describes the morning cough with thick white  sputum. He tried off [place] but decided it helped him. He feels his shortness of breath is almost back to his baseline of last fall. CT chest 05/02/12- reviewed with him IMPRESSION:  No evidence of pulmonary embolism.  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  Scattered atherosclerotic disease.  Question pulmonary arterial hypertension.  Minimal patchy central infiltrates in right upper right lower lobes  with a questionable 8 mm diameter stellate density versus scarring  in the right middle lobe.  If the patient has prior outside CT exams, recommend these be  obtained for comparison to establish stability.  In the absence of prior exams, recommend referral to the  [facility] for management.  Original Report Authenticated By: Burnetta Sabin, M.D.   11/04/12- 68 yoM former smoker followed for COPD, hx lung nodule. FOLLOWS FOR: sob and wheezing at times-mainly in the mornings (does physical work during the day) Declines flu vaccine. 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 Minor cough. Since his exacerbation last year he has improved significantly but still more dyspnea with exertion than he remembers. Wakes each morning with chest congestion, morning cough with clear mucus. Uses Ventolin rescue inhaler twice daily and continues Dulera 100.  Original Report Authenticated By: Arline Asp, M.D.   05/06/13- 68 yoM former smoker followed for COPD, hx lung nodule. FOLLOWS FOR: most every morning has congestion and able to bring up clear/white phlegm. Never  fully recovered exercise tolerance after viral illness in 2013 but proud to be physically active at work. CXR 02/02/13 IMPRESSION:  Apparent interval resolution of previously seen right middle lobe  nodule.  Stable evidence of emphysema.  No new acute abnormality or other abnormality.  Stable ascending aneurysmal ectasia.   Original Report Authenticated By: Arline Asp, M.D.  10/07/13- 68 yoM former smoker followed for COPD, hx lung nodule. FOLLOWS FOR: went back on Dulera-uses 1-2 puffs qd instead and is cheaper than using Breo daily-couldn't tell much difference Denies chest pain, bloody or purulent sputum. Comfortable with his medications. We discussed flu vaccine and pneumococcal vaccine. CT chest indicated small lung nodules are likely benign and can be followed conservatively. There is some emphysema and airway changes of chronic bronchitis. He has continued to work. Activities limited by pain in his knee and he is now pending knee replacement. PFT: 03/19/2012-moderate obstructive airways disease, insignificant response to bronchodilator , air trapping,normal diffusion capacity. Loop contour suggests emphysema. FVC 3.73/91%, FEV1 2.07/73%, FEV1/FEC 0.56. TLC 114%, RV 138% , DLCO 90%. CT chest 10/01/13 IMPRESSION:  1. Visualized small lung nodules are benign based on current  consensus criteria and do not require further workup.  2. Emphysema with airway thickening.  3. Stable ectatic ascending thoracic aorta.  Electronically Signed  By: Sherryl Barters M.D.  On: 10/01/2013 12:03  02/06/14- 68 yoM former smoker followed for COPD, hx lung nodules. ACUTE VISIT:  Increased sob, wheezing and cough with clear mucus x3 weeks.  Also, increased use of rescue inhaler up to 5 times a day Never got TKR, got dental work done first. Now acute increased shortness of breath x3 weeks. Intermittent discomfort right upper anterior chest, not exertional.  04/07/14- 67 yoM former smoker followed for COPD, hx lung nodules. FOLLOWS FOR: Pt states he feels better since last visit-no more flare ups or trouble breathing. Doing very well, using rescue inhaler once a day, continues [facility]  06/29/14- 67 yoM former smoker followed for COPD, hx lung nodules. ACUTE VISIT:  Coughing up green mucus, sore throat and some fever. One  day-fever, sore throat, green sputum. Had been visiting [place] and exposed to a lot of people.  ROS-see HPI Constitutional:   No-   weight loss, night sweats, +fevers, chills, fatigue, lassitude. HEENT:   No-  headaches, difficulty swallowing, tooth/dental problems, +sore throat,       No-  sneezing, itching, ear ache, nasal congestion, post nasal drip,  CV:  No- chest pain, orthopnea, PND, swelling in lower extremities, anasarca,  dizziness, palpitations Resp: +   shortness of breath with exertion or at rest.              +productive cough,no- non-productive cough,  No- coughing up of blood.             + change in color of mucus.  + wheezing.   Skin: No-   rash or lesions. GI:  No-   heartburn, indigestion, abdominal pain, nausea, vomiting GU: No-   dysuria,. MS:  +  joint pain or swelling.  + decreased range of motion.  +- back pain. Neuro-     nothing unusual Psych:  No- change in mood or affect. No depression or anxiety.  No memory loss.  OBJ- Physical Exam General- Alert, Oriented, Affect-appropriate, Distress- none acute Skin- rash-none, lesions- none, excoriation- none Lymphadenopathy- none Head- atraumatic            Eyes- Gross vision intact, PERRLA, conjunctivae and secretions  clear            Ears- Hearing, canals-normal            Nose- Clear, no-Septal dev, mucus, polyps, erosion, perforation             Throat- Mallampati II , mucosa+ red , drainage+ thick, tonsils- atrophic Neck- flexible , trachea midline, no stridor , thyroid nl, carotid no bruit Chest - symmetrical excursion , unlabored           Heart/CV- RRR , no murmur , no gallop  , no rub, nl s1 s2                           - JVD- none , edema- none, stasis changes- none, varices- none           Lung- distant/clear,  Wheeze-none, cough- none , dullness-none, rub- none, +raspy laugh           Chest wall-  Abd-  Br/ Gen/ Rectal- Not done, not indicated Extrem- cyanosis- none, clubbing, none, atrophy-  none, strength- nl.  Neuro- grossly intact to observation

## 2014-06-29 NOTE — Patient Instructions (Signed)
Script sent for Z pak  Extra fluids, Mucinex/guaifenecin etc are fine if they help you.  Please call as needed

## 2014-08-25 DIAGNOSIS — E291 Testicular hypofunction: Secondary | ICD-10-CM | POA: Diagnosis not present

## 2014-08-25 DIAGNOSIS — R3129 Other microscopic hematuria: Secondary | ICD-10-CM | POA: Diagnosis not present

## 2014-08-25 DIAGNOSIS — N400 Enlarged prostate without lower urinary tract symptoms: Secondary | ICD-10-CM | POA: Diagnosis not present

## 2014-08-31 DIAGNOSIS — H6120 Impacted cerumen, unspecified ear: Secondary | ICD-10-CM | POA: Diagnosis not present

## 2014-08-31 DIAGNOSIS — H60399 Other infective otitis externa, unspecified ear: Secondary | ICD-10-CM | POA: Diagnosis not present

## 2014-09-07 ENCOUNTER — Other Ambulatory Visit: Payer: Self-pay | Admitting: Internal Medicine

## 2014-09-09 DIAGNOSIS — H6120 Impacted cerumen, unspecified ear: Secondary | ICD-10-CM | POA: Diagnosis not present

## 2014-10-04 DIAGNOSIS — J209 Acute bronchitis, unspecified: Secondary | ICD-10-CM | POA: Insufficient documentation

## 2014-10-04 DIAGNOSIS — J440 Chronic obstructive pulmonary disease with acute lower respiratory infection: Principal | ICD-10-CM

## 2014-10-04 NOTE — Assessment & Plan Note (Signed)
Baseline emphysema but has continued working

## 2014-10-04 NOTE — Assessment & Plan Note (Signed)
Upper respiratory infection with tracheobronchitis Plan-Z-Pak, fluids. Continue routine meds.

## 2014-10-07 ENCOUNTER — Telehealth: Payer: Self-pay | Admitting: Internal Medicine

## 2014-10-07 NOTE — Telephone Encounter (Signed)
Pt states the he has been having to use Albuterol HFA frequently. Pt reports no improvement in symptoms even with rescue inhaler.  Pt c/o increased SOB and chest tightness with discomfort. Pt reports this worsening during the night--wakes him up out of his sleep. Pt requesting to be seen today and have a breathing treatment done. If unable to be seen today pt requests tomorrow. Pt states this is not urgent at this time.  Pt scheduled for OV with TP 10/08/14 at 9:30 Nothing further needed.

## 2014-10-08 ENCOUNTER — Ambulatory Visit (INDEPENDENT_AMBULATORY_CARE_PROVIDER_SITE_OTHER): Payer: Medicare Other | Admitting: Adult Health

## 2014-10-08 ENCOUNTER — Encounter: Payer: Self-pay | Admitting: Adult Health

## 2014-10-08 VITALS — BP 122/68 | HR 75 | Temp 97.0°F | Ht 67.0 in | Wt 185.2 lb

## 2014-10-08 DIAGNOSIS — J432 Centrilobular emphysema: Secondary | ICD-10-CM | POA: Diagnosis not present

## 2014-10-08 MED ORDER — LEVALBUTEROL HCL 0.63 MG/3ML IN NEBU
0.6300 mg | INHALATION_SOLUTION | Freq: Once | RESPIRATORY_TRACT | Status: AC
  Administered 2014-10-08: 0.63 mg via RESPIRATORY_TRACT

## 2014-10-08 MED ORDER — PREDNISONE 10 MG PO TABS: ORAL_TABLET | ORAL | Status: DC

## 2014-10-08 MED ORDER — AZITHROMYCIN 250 MG PO TABS: ORAL_TABLET | ORAL | Status: AC

## 2014-10-08 NOTE — Assessment & Plan Note (Addendum)
COPD flare  xopenex neb x 1   Plan Prednisone taper over next week.  Mucinex DM Twice daily  As needed  Cough/congestion  Zpack to have on hold if symptoms worsen with discolored mucus .  Fluids and rest  Restart Dulera 2 puffs Twice daily  , rinse after use.  Please contact office for sooner follow up if symptoms do not improve or worsen or seek emergency care  Follow up Dr. Annamaria Boots  In 6 months and As needed

## 2014-10-08 NOTE — Patient Instructions (Addendum)
Prednisone taper over next week.  Mucinex DM Twice daily  As needed  Cough/congestion  Zpack to have on hold if symptoms worsen with discolored mucus .  Fluids and rest  Restart Dulera 2 puffs Twice daily  , rinse after use.  Please contact office for sooner follow up if symptoms do not improve or worsen or seek emergency care  Follow up Dr. Annamaria Boots  In 6 months and As needed

## 2014-10-08 NOTE — Progress Notes (Signed)
02/20/12- 68 yoM former smoker referred by Mathews Robinsons, PA-C/ [facility] because of COPD. Spirometry 01/24/12- FVC 2.24/ 64%, FEV1 1.20/ 42%, FEV1/FVC 0.53  He considers himself normally an active and athletic person. In December of 2012 he became aware of shortness of breath walking 100 yards. This is either new or not noticed previously. He had some prednisone left over from an orthopedic visit so he tried that with no effect. In January he went to urgent care where he was diagnosed with pneumonia and treated with Levaquin x10 days with no effect. He denies fever, cough or phlegm. He was given a repeat prednisone taper from 60 mg which seemed to help modestly. Prednisone caused weight gain and nervousness but did seem to make a big difference after a third round, which ended 3 weeks ago.Marland Kitchen He says he is now breathing "phenomenally better". He is still not back to normal exercise tolerance. He notices wheeze when he is short of breath with exertion. He was given Spiriva but decided not to try until he was seen here because he has a history of urinary outlet obstruction. He was also given a sample of Dulera but uncertain effect. For 4 or 5 years he has used a Primatene inhaler for morning shortness of breath occasionally. He did not find a rescue albuterol inhaler as effective. He doesn't know about past history of pneumonia but denies TB exposure. There is no family history of DVT or pulmonary embolism. Mother was a smoker who died at age 8 of COPD. He denies personal history of heart disease or symptoms of chest pain or palpitation. 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    05/17/12- 68 yoM former smoker referred by Mathews Robinsons, PA-C/ [place] because of COPD. Patient states is better. c/o sob with exertion, wheezing, and cough. Denies chest pain and chest tightness.  CAT COPD assessment test-8/40. Describes the morning cough with thick white  sputum. He tried off [place] but decided it helped him. He feels his shortness of breath is almost back to his baseline of last fall. CT chest 05/02/12- reviewed with him IMPRESSION:  No evidence of pulmonary embolism.  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  Scattered atherosclerotic disease.  Question pulmonary arterial hypertension.  Minimal patchy central infiltrates in right upper right lower lobes  with a questionable 8 mm diameter stellate density versus scarring  in the right middle lobe.  If the patient has prior outside CT exams, recommend these be  obtained for comparison to establish stability.  In the absence of prior exams, recommend referral to the  [facility] for management.  Original Report Authenticated By: Burnetta Sabin, M.D.   11/04/12- 68 yoM former smoker followed for COPD, hx lung nodule. FOLLOWS FOR: sob and wheezing at times-mainly in the mornings (does physical work during the day) Declines flu vaccine. 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 Minor cough. Since his exacerbation last year he has improved significantly but still more dyspnea with exertion than he remembers. Wakes each morning with chest congestion, morning cough with clear mucus. Uses Ventolin rescue inhaler twice daily and continues Dulera 100.  Original Report Authenticated By: Arline Asp, M.D.   05/06/13- 68 yoM former smoker followed for COPD, hx lung nodule. FOLLOWS FOR: most every morning has congestion and able to bring up clear/white phlegm. Never  fully recovered exercise tolerance after viral illness in 2013 but proud to be physically active at work. CXR 02/02/13 IMPRESSION:  Apparent interval resolution of previously seen right middle lobe  nodule.  Stable evidence of emphysema.  No new acute abnormality or other abnormality.  Stable ascending aneurysmal ectasia.   Original Report Authenticated By: Arline Asp, M.D.  10/07/13- 68 yoM former smoker followed for COPD, hx lung nodule. FOLLOWS FOR: went back on Dulera-uses 1-2 puffs qd instead and is cheaper than using Breo daily-couldn't tell much difference Denies chest pain, bloody or purulent sputum. Comfortable with his medications. We discussed flu vaccine and pneumococcal vaccine. CT chest indicated small lung nodules are likely benign and can be followed conservatively. There is some emphysema and airway changes of chronic bronchitis. He has continued to work. Activities limited by pain in his knee and he is now pending knee replacement. PFT: 03/19/2012-moderate obstructive airways disease, insignificant response to bronchodilator , air trapping,normal diffusion capacity. Loop contour suggests emphysema. FVC 3.73/91%, FEV1 2.07/73%, FEV1/FEC 0.56. TLC 114%, RV 138% , DLCO 90%. CT chest 10/01/13 IMPRESSION:  1. Visualized small lung nodules are benign based on current  consensus criteria and do not require further workup.  2. Emphysema with airway thickening.  3. Stable ectatic ascending thoracic aorta.  Electronically Signed  By: Sherryl Barters M.D.  On: 10/01/2013 12:03  02/06/14- 68 yoM former smoker followed for COPD, hx lung nodules. ACUTE VISIT:  Increased sob, wheezing and cough with clear mucus x3 weeks.  Also, increased use of rescue inhaler up to 5 times a day Never got TKR, got dental work done first. Now acute increased shortness of breath x3 weeks. Intermittent discomfort right upper anterior chest, not exertional.  04/07/14- 68 yoM former smoker followed for COPD, hx lung nodules. FOLLOWS FOR: Pt states he feels better since last visit-no more flare ups or trouble breathing. Doing very well, using rescue inhaler once a day, continues [facility]  06/29/14- 68 yoM former smoker followed for COPD, hx lung nodules. ACUTE VISIT:  Coughing up green mucus, sore throat and some fever. One  day-fever, sore throat, green sputum. Had been visiting [place] and exposed to a lot of people.  10/08/2014 Acute OV  68 yoM former smoker followed for COPD, hx lung nodules. Complains of increased cough, wheezing and thick mucus with increased SOB for 1 week .  No otc used for tx.  No fever, hemoptysis , chest pain, orthopnea or edema.  No recent travel or abx use.  Not using Dulera on regular basis. We talked med compliance and controller use.  Has had increased SABA use.      ROS-see HPI Constitutional:   No-   weight loss, night sweats,  chills, fatigue, lassitude. HEENT:   No-  headaches, difficulty swallowing, tooth/dental problems,        No-  sneezing, itching, ear ache,  +nasal congestion, post nasal drip,  CV:  No- chest pain, orthopnea, PND, swelling in lower extremities, anasarca,  dizziness, palpitations Resp: +   shortness of breath with exertion or at rest.              +productive cough,no- non-productive cough,  No- coughing up of blood.             No change in color of mucus.  + wheezing.   Skin: No-   rash or lesions. GI:  No-   heartburn, indigestion, abdominal pain, nausea, vomiting GU: No-   dysuria,. MS:  +  joint pain or swelling.     Neuro-     nothing unusual Psych:  No- change in mood or affect. No depression or anxiety.  No memory loss.  OBJ- Physical Exam GEN: A/Ox3; pleasant , NAD, well nourished   HEENT:  [place]/AT,  EACs-clear, TMs-wnl, NOSE-clear, THROAT-clear, no lesions, no postnasal drip or exudate noted.   NECK:  Supple w/ fair ROM; no JVD; normal carotid impulses w/o bruits; no thyromegaly or nodules palpated; no lymphadenopathy.  RESP  Exp wheezing noted no accessory muscle use, no dullness to percussion  CARD:  RRR, no m/r/g  , no peripheral edema, pulses intact, no cyanosis or clubbing.  GI:   Soft & nt; nml bowel sounds; no organomegaly or masses detected.  Musco: Warm bil, no deformities or joint swelling noted.   Neuro: alert,  no focal deficits noted.    Skin: Warm, no lesions or rashes

## 2014-10-15 DIAGNOSIS — M25511 Pain in right shoulder: Secondary | ICD-10-CM | POA: Diagnosis not present

## 2014-11-16 ENCOUNTER — Telehealth: Payer: Self-pay | Admitting: Internal Medicine

## 2014-11-16 MED ORDER — ALBUTEROL SULFATE HFA 108 (90 BASE) MCG/ACT IN AERS: 1.0000 | INHALATION_SPRAY | Freq: Four times a day (QID) | RESPIRATORY_TRACT | Status: DC | PRN

## 2014-11-16 NOTE — Telephone Encounter (Signed)
I spoke with the pt and he states that his formulary will be changing and he needs to change ventolin to proair. Rx sent. Nothing further needed. [place] Bing, CMA

## 2014-11-16 NOTE — Telephone Encounter (Signed)
Patient returning call.

## 2014-11-16 NOTE — Telephone Encounter (Signed)
[email or web address].Jennifer Castillo, CMA  

## 2014-12-17 ENCOUNTER — Other Ambulatory Visit: Payer: Self-pay | Admitting: Internal Medicine

## 2015-01-03 ENCOUNTER — Ambulatory Visit (INDEPENDENT_AMBULATORY_CARE_PROVIDER_SITE_OTHER): Payer: Medicare Other | Admitting: Family Medicine

## 2015-01-03 VITALS — BP 118/80 | HR 85 | Temp 98.6°F | Resp 19 | Ht 66.0 in | Wt 180.6 lb

## 2015-01-03 DIAGNOSIS — J441 Chronic obstructive pulmonary disease with (acute) exacerbation: Secondary | ICD-10-CM

## 2015-01-03 MED ORDER — IPRATROPIUM BROMIDE 0.02 % IN SOLN
0.5000 mg | Freq: Once | RESPIRATORY_TRACT | Status: AC
  Administered 2015-01-03: 0.5 mg via RESPIRATORY_TRACT

## 2015-01-03 MED ORDER — METHYLPREDNISOLONE ACETATE 80 MG/ML IJ SUSP
80.0000 mg | Freq: Once | INTRAMUSCULAR | Status: AC
  Administered 2015-01-03: 80 mg via INTRAMUSCULAR

## 2015-01-03 MED ORDER — ALBUTEROL SULFATE (2.5 MG/3ML) 0.083% IN NEBU
2.5000 mg | INHALATION_SOLUTION | Freq: Once | RESPIRATORY_TRACT | Status: AC
Start: 2015-01-03 — End: 2015-01-03
  Administered 2015-01-03: 2.5 mg via RESPIRATORY_TRACT

## 2015-01-03 NOTE — Progress Notes (Addendum)
Subjective:  This chart was scribed for Robyn Haber MD, by Tamsen Roers, at [facility].  This patient was seen in room Room/bed info not found and the patient's care was started at 3:25 PM.   Patient ID: Kent Flowers, male    DOB: 1946/04/11, 69 y.o.   MRN: [id number]  HPI   HPI Comments: Kent Flowers is a 69 y.o. male who presents to [facility] complaining of SOB/wheezing for the past 3-4 days which was most intense last night. Patient notes he has had shortness of breath which has occurred 3-4 times in the last 10 years. He also states that he has chest pains from time to time.  Patient notes he gets SOB during his attacks and is not able to walk short distances without panting.  Patient notes he has had steroid shots for treatment in the past.  Patient has been taking his albuterol inhaler for relief currently and takes pain medication for his knee pain.  Patient is in [employer].  He is a former smoker.  He denies any chest pain, lower extremity pain.  Patient is right handed.     Patient Active Problem List   Diagnosis Date Noted  . COPD with acute bronchitis 10/04/2014  . Hepatitis, unspecified 03/13/2014  . Chronic low back pain   . Knee pain, bilateral   . Right knee DJD   . Hepatitis 11/03/2013  . 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 Aortic calcification 11/03/2013  . Coronary atherosclerosis of native coronary artery 11/03/2013  . Penile neoplasm   . Ascending aortic aneurysm   . Lung nodule 05/23/2012  . COPD (chronic obstructive pulmonary disease) with emphysema 02/24/2012   Past Medical History  Diagnosis Date  . Penile neoplasm 2014  . COPD (chronic obstructive pulmonary disease) with emphysema     PULMOLOGIST-- DR Tarri Fuller YOUNG  . Ascending aortic aneurysm     STABLE PER CHEST CT 09-26-2012  (4.3CM x 4.3CM)  . Chronic low back pain   . Arthritis   . Knee pain, bilateral     INTERMITTENT--  MENISCUS    . 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 Aortic calcification 11/03/2013  . Right knee DJD    Past Surgical History  Procedure Laterality Date  . Tonsillectomy  AS CHILD  . Shoulder arthroscopy with rotator cuff repair and subacromial decompression Left 2004  . Reattachment left index finger  1988  . Penile biopsy N/A 03/14/2013    Procedure: PENILE BIOPSY;  Surgeon: Fredricka Bonine, MD;  Location: [facility];  Service: Urology;  Laterality: N/A;   Allergies  Allergen Reactions  . Ceclor [Cefaclor] Rash  . Sulfa Antibiotics Rash    SEVERE RASH, Childhood allergy   Prior to Admission medications   Medication Sig Start Date End Date Taking? Authorizing Provider  DULERA 200-5 MCG/ACT AERO INHALE 2 PUFFS INTO THE LUNGS TWICE DAILY 03/17/14  Yes Deneise Lever, MD  Oxycodone HCl 10 MG TABS Take 10 mg by mouth 3 (three) times daily. OXY - IR   Yes Historical Provider, MD  predniSONE (DELTASONE) 10 MG tablet 4 tabs for 2 days, 2 tabs for 2 days, then 1 tab for 2 days, then stop 10/08/14  Yes Tammy S Parrett, NP  PROAIR HFA 108 (90 BASE) MCG/ACT inhaler INHALE 1 OR 2 PUFFS INTO THE LUNGS EVERY6 HOURS AS NEEDED FOR SHORTNESS OF BREATH OR WHEEZING 12/17/14  Yes Deneise Lever, MD  ZIPSOR 25 MG CAPS Take 1 tablet by mouth 4 (four) times daily as needed. Normally takes around 3-4 pills a week 04/01/13  Yes Historical Provider, MD   History   Social History  . Marital Status: Married    Spouse Name: N/A    Number of Children: 4  . Years of Education: N/A   Occupational History  . [employer]    Social History Main Topics  . Smoking status: Former Smoker -- 1.00 packs/day for 12 years    Types: Cigarettes    Quit date: 12/18/2009  . Smokeless tobacco: Never Used     Comment: HAD SMOKED OFF AND ON OVER 25 YR SPAN--  TOTAL 12 YRS  . Alcohol Use: 1.8 oz/week    3 Glasses of wine per week     Comment: 3/4 of a bottle of wine a day  . Drug Use: No  . Sexual  Activity: Not on file   Other Topics Concern  . Not on file   Social History Narrative       Review of Systems  Respiratory: Positive for shortness of breath and wheezing.   Cardiovascular: Negative for chest pain.  Musculoskeletal: Negative for myalgias.  Neurological: Negative for numbness.       Objective:   Physical Exam   Filed Vitals:   01/03/15 1504  BP: 118/80  Pulse: 85  Temp: 98.6 F (37 C)  TempSrc: Oral  Resp: 19  Height: 5\' 6"  (1.676 m)  Weight: 180 lb 9.6 oz (81.92 kg)  SpO2: 94%   this is an elderly appearing gentleman (appearing older than stated age) sees in no acute respiratory distress. HEENT: Patient does have mild rhinophyma is pitting of the skin his cheeks, TMs are normal, oropharynx is clear although patient is fitted with dentures. Neck: Supple with no bruits heard although patient does have audible expiratory wheezes Chest: Expiratory wheezes diffusely Heart: Regular, distant, with no obvious murmurs. Extremities: No edema  Post nebulizer treatment: Markedly improved symptomatically and decreased wheezes objectively    Assessment & Plan:    This chart was scribed in my presence and reviewed by me personally.    ICD-9-CM ICD-10-CM   1. Chronic obstructive pulmonary disease with acute exacerbation 491.21 J44.1 albuterol (PROVENTIL) (2.5 MG/3ML) 0.083% nebulizer solution 2.5 mg     ipratropium (ATROVENT) nebulizer solution 0.5 mg     methylPREDNISolone acetate (DEPO-MEDROL) injection 80 mg     Signed, Robyn Haber, MD

## 2015-02-06 ENCOUNTER — Encounter: Payer: Self-pay | Admitting: Gastroenterology

## 2015-02-18 ENCOUNTER — Other Ambulatory Visit: Payer: Self-pay | Admitting: Internal Medicine

## 2015-03-10 DIAGNOSIS — H6122 Impacted cerumen, left ear: Secondary | ICD-10-CM | POA: Diagnosis not present

## 2015-03-16 ENCOUNTER — Telehealth: Payer: Self-pay | Admitting: *Deleted

## 2015-03-16 NOTE — Telephone Encounter (Signed)
Left message with male to return my call about flu vaccine.

## 2015-03-16 NOTE — Telephone Encounter (Signed)
Pt returned your call. Stated he will talk to Dr. Lorelei Pont about flu shot tomorrow at his appt.

## 2015-03-17 ENCOUNTER — Ambulatory Visit (INDEPENDENT_AMBULATORY_CARE_PROVIDER_SITE_OTHER): Payer: Medicare Other | Admitting: Family Medicine

## 2015-03-17 ENCOUNTER — Encounter: Payer: Self-pay | Admitting: Family Medicine

## 2015-03-17 VITALS — BP 116/70 | HR 84 | Temp 98.6°F | Ht 66.0 in | Wt 180.8 lb

## 2015-03-17 DIAGNOSIS — F430 Acute stress reaction: Secondary | ICD-10-CM | POA: Diagnosis not present

## 2015-03-17 MED ORDER — ALPRAZOLAM 0.5 MG PO TABS: 0.5000 mg | ORAL_TABLET | Freq: Three times a day (TID) | ORAL | Status: DC | PRN

## 2015-03-17 NOTE — Progress Notes (Signed)
Pre visit review using our clinic review tool, if applicable. No additional management support is needed unless otherwise documented below in the visit note. 

## 2015-03-17 NOTE — Progress Notes (Signed)
Dr. Frederico Hamman T. Zanden Colver, MD, [facility] [place], [address] Phone: [phone] Fax: [phone]  03/17/2015  Patient: Kent Flowers, MRN: [id number], DOB: May 01, 1946, 69 y.o.  Primary Physician:  Owens Loffler, MD  Chief Complaint: Anxiety  Subjective:   Kent Flowers is a 69 y.o. very pleasant male patient who presents with the following:  Daughter just graduated from [employer] . Got [facility].   Works as an Arboriculturist.   Past Medical History, Surgical History, Social History, Family History, Problem List, Medications, and Allergies have been reviewed and updated if relevant.   GEN: No acute illnesses, no fevers, chills. GI: No n/v/d, eating normally Pulm: No SOB Interactive and getting along well at home.  Otherwise, ROS is as per the HPI.  Objective:   BP 116/70 mmHg  Pulse 84  Temp(Src) 98.6 F (37 C) (Oral)  Ht 5\' 6"  (1.676 m)  Wt 180 lb 12 oz (81.988 kg)  BMI 29.19 kg/m2  GEN: WDWN, NAD, Non-toxic, A & O x 3 HEENT: Atraumatic, Normocephalic. Neck supple. No masses, No LAD. Ears and Nose: No external deformity. CV: RRR, No M/G/R. No JVD. No thrill. No extra heart sounds. PULM: CTA B, no wheezes, crackles, rhonchi. No retractions. No resp. distress. No accessory muscle use. EXTR: No c/c/e NEURO Normal gait.  PSYCH: Normally interactive. Conversant. Not depressed or anxious appearing.  Calm demeanor.   Laboratory and Imaging Data:  Assessment and Plan:   Stress reaction  >25 minutes spent in face to face time with patient, >50% spent in counselling or coordination of care: The patient is undergoing significant psychosocial stress at home. His daughter was recently committed involuntarily once, and she also was committed voluntarily once into the mental hospital in 2 different institutions. She just graduated from college with a chemical degree. Her behavior has altered  quite a bit in the last year, and she is now doing things that do them do not make any sense.  They do not have a clear diagnosis. The patient and his wife are also distraught. He has noticed that he has become irritable at work, and has had some of his emotions slowly into his daily life. We are going to give him some Xanax to use as needed over the next month or 2. He understands this is short-term use only.  Follow-up: prn  New Prescriptions   ALPRAZOLAM (XANAX) 0.5 MG TABLET    Take 1 tablet (0.5 mg total) by mouth 3 (three) times daily as needed for anxiety.   No orders of the defined types were placed in this encounter.    Signed,  Maud Deed. Shondell Poulson, MD   Patient's Medications  New Prescriptions   ALPRAZOLAM (XANAX) 0.5 MG TABLET    Take 1 tablet (0.5 mg total) by mouth 3 (three) times daily as needed for anxiety.  Previous Medications   DULERA 200-5 MCG/ACT AERO    INHALE 2 PUFFS INTO THE LUNGS TWICE DAILY   OXYCODONE HCL 10 MG TABS    Take 10 mg by mouth 3 (three) times daily. OXY - IR   PROAIR HFA 108 (90 BASE) MCG/ACT INHALER    INHALE 1 OR 2 PUFFS INTO THE LUNGS EVERY6 HOURS AS NEEDED FOR SHORTNESS OF BREATH OR WHEEZING   ZIPSOR 25 MG CAPS    Take 1 tablet by mouth 4 (four) times daily as needed. Normally takes around 3-4 pills a week  Modified Medications   No medications on file  Discontinued Medications   PREDNISONE (DELTASONE) 10 MG TABLET    4 tabs for 2 days, 2 tabs for 2 days, then 1 tab for 2 days, then stop

## 2015-03-29 ENCOUNTER — Encounter: Payer: Self-pay | Admitting: Family Medicine

## 2015-03-29 ENCOUNTER — Ambulatory Visit (INDEPENDENT_AMBULATORY_CARE_PROVIDER_SITE_OTHER): Payer: Medicare Other | Admitting: Family Medicine

## 2015-03-29 VITALS — BP 152/86 | HR 77 | Temp 98.0°F | Resp 18 | Ht 67.0 in | Wt 187.0 lb

## 2015-03-29 DIAGNOSIS — J209 Acute bronchitis, unspecified: Secondary | ICD-10-CM

## 2015-03-29 DIAGNOSIS — R0602 Shortness of breath: Secondary | ICD-10-CM

## 2015-03-29 DIAGNOSIS — R062 Wheezing: Secondary | ICD-10-CM

## 2015-03-29 MED ORDER — ALBUTEROL SULFATE (2.5 MG/3ML) 0.083% IN NEBU
2.5000 mg | INHALATION_SOLUTION | Freq: Once | RESPIRATORY_TRACT | Status: AC
  Administered 2015-03-29: 2.5 mg via RESPIRATORY_TRACT

## 2015-03-29 MED ORDER — AZITHROMYCIN 250 MG PO TABS: ORAL_TABLET | ORAL | Status: DC

## 2015-03-29 MED ORDER — METHYLPREDNISOLONE ACETATE 80 MG/ML IJ SUSP
80.0000 mg | Freq: Once | INTRAMUSCULAR | Status: AC
  Administered 2015-03-29: 80 mg via INTRAMUSCULAR

## 2015-03-29 NOTE — Addendum Note (Signed)
Addended by: Frutoso Chase A on: 03/29/2015 12:53 PM   Modules accepted: Orders

## 2015-03-29 NOTE — Progress Notes (Signed)
Patient ID: Kent Flowers, male   DOB: 1946/09/21, 69 y.o.   MRN: [id number]  This chart was scribed for Robyn Haber, MD by Ladene Artist, ED Scribe. The patient was seen in room 9. Patient's care was started at 12:07 PM.  Patient ID: Kent Flowers MRN: [id number], DOB: Jul 25, 1946, 69 y.o. Date of Encounter: 03/29/2015, 12:04 PM  Primary Physician: Owens Loffler, MD  Chief Complaint  Patient presents with   Cough   Shortness of Breath    HPI: 69 y.o. year old male with history below presents with gradually worsening productive cough with clear phlegm for the past 2 days. Pt reports more phlegm with cough at night. He reports associated chest congestion, shortness of breath. Pt denies chest pain, h/o environmental allergies. He reports similar symptoms x2-3 over the past 5-6 years that have improved with breathing treatments and Prednisone injections. Pt has been treating with Guaifenesin for the past 2 days. Pt is a former smoker.   Pt works as an Arboriculturist.   Past Medical History  Diagnosis Date   Penile neoplasm 2014   COPD (chronic obstructive pulmonary disease) with emphysema     PULMOLOGIST-- DR Tarri Fuller YOUNG   Ascending aortic aneurysm     STABLE PER CHEST CT 09-26-2012  (4.3CM x 4.3CM)   Chronic low back pain    Arthritis    Knee pain, bilateral     INTERMITTENT--  MENISCUS   Ectatic thoracic aorta 11/03/2013   Aortic calcification 11/03/2013   Right knee DJD      Home Meds: Prior to Admission medications   Medication Sig Start Date End Date Taking? Authorizing Provider  ALPRAZolam Duanne Moron) 0.5 MG tablet Take 1 tablet (0.5 mg total) by mouth 3 (three) times daily as needed for anxiety. 03/17/15   Owens Loffler, MD  DULERA 200-5 MCG/ACT AERO INHALE 2 PUFFS INTO THE LUNGS TWICE DAILY 03/17/14   Deneise Lever, MD  Oxycodone HCl 10 MG TABS Take 10 mg by mouth 3 (three) times daily. OXY - IR    Historical Provider, MD  PROAIR HFA 108 (90 BASE) MCG/ACT  inhaler INHALE 1 OR 2 PUFFS INTO THE LUNGS EVERY6 HOURS AS NEEDED FOR SHORTNESS OF BREATH OR WHEEZING 02/18/15   Deneise Lever, MD  ZIPSOR 25 MG CAPS Take 1 tablet by mouth 4 (four) times daily as needed. Normally takes around 3-4 pills a week 04/01/13   Historical Provider, MD    Allergies:  Allergies  Allergen Reactions   Ceclor [Cefaclor] Rash   Sulfa Antibiotics Rash    SEVERE RASH, Childhood allergy    History   Social History   Marital Status: Married    Spouse Name: N/A   Number of Children: 4   Years of Education: N/A   Occupational History   landscape arch    Social History Main Topics   Smoking status: Former Smoker -- 1.00 packs/day for 12 years    Types: Cigarettes    Quit date: 12/18/2009   Smokeless tobacco: Never Used     Comment: HAD SMOKED OFF AND ON OVER 25 YR SPAN--  TOTAL 12 YRS   Alcohol Use: 1.8 oz/week    3 Glasses of wine per week     Comment: 3/4 of a bottle of wine a day   Drug Use: No   Sexual Activity: Not on file   Other Topics Concern   Not on file   Social History Narrative    Review of Systems: Constitutional: negative  for chills, fever, night sweats, weight changes, or fatigue  HEENT: negative for vision changes, hearing loss, congestion, rhinorrhea, ST, epistaxis, or sinus pressure Cardiovascular: negative for chest pain or palpitations Respiratory: negative for hemoptysis, wheezing, + shortness of breath, + cough Abdominal: negative for abdominal pain, nausea, vomiting, diarrhea, or constipation Dermatological: negative for rash Neurologic: negative for headache, dizziness, or syncope All other systems reviewed and are otherwise negative with the exception to those above and in the HPI.  Physical Exam: Blood pressure 152/86, pulse 77, temperature 98 F (36.7 C), temperature source Oral, resp. rate 18, height 5\' 7"  (1.702 m), weight 187 lb (84.823 kg), SpO2 95 %., Body mass index is 29.28 kg/(m^2). General: Well  developed, well nourished, in no acute distress. Head: Normocephalic, atraumatic, eyes without discharge, sclera non-icteric, nares are without discharge. Bilateral auditory canals clear, TM's are without perforation, pearly grey and translucent with reflective cone of light bilaterally. Oral cavity moist, posterior pharynx without exudate, erythema, peritonsillar abscess, or post nasal drip.  Neck: Supple. No thyromegaly. Full ROM. No lymphadenopathy. Lungs: No rales or rhonchi. Breathing is unlabored. Expiratory wheezes, worse on the R.  Heart: RRR with S1 S2. No murmurs, rubs, or gallops appreciated. Abdomen: Soft, non-tender, non-distended with normoactive bowel sounds. No hepatomegaly. No rebound/guarding. No obvious abdominal masses. Msk:  Strength and tone normal for age. Extremities/Skin: Warm and dry. No clubbing or cyanosis. No edema. No rashes or suspicious lesions. Neuro: Alert and oriented X 3. Moves all extremities spontaneously. Gait is normal. CNII-XII grossly in tact. Psych:  Responds to questions appropriately with a normal affect.   After the nebulizer treatment, patient felt significantly better. He is moving better air although the right lower lobe still has more rhonchi and a few rales with wheezes in the left side.   ASSESSMENT AND PLAN:  69 y.o. 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 He does have some asymmetry to his breath sounds but I think that without a fever, change in appetite, or other systemic symptoms were safe and just treating this is an acute bronchitis. He knows that it is not getting better he should return or see his primary care doctor, Dr. Keturah Barre This chart was scribed in my presence and reviewed by me personally.    ICD-9-CM ICD-10-CM   1. Acute bronchitis, unspecified organism 466.0 J20.9 azithromycin (ZITHROMAX Z-PAK) 250 MG tablet     methylPREDNISolone acetate (DEPO-MEDROL) injection 80 mg     Signed, Robyn Haber,  MD  Signed, Robyn Haber, MD 03/29/2015 12:04 PM

## 2015-04-05 DIAGNOSIS — M5442 Lumbago with sciatica, left side: Secondary | ICD-10-CM | POA: Diagnosis not present

## 2015-04-08 ENCOUNTER — Ambulatory Visit: Payer: 59 | Admitting: Internal Medicine

## 2015-04-16 ENCOUNTER — Other Ambulatory Visit: Payer: Self-pay | Admitting: Internal Medicine

## 2015-05-01 ENCOUNTER — Ambulatory Visit: Payer: Medicare Other

## 2015-05-01 ENCOUNTER — Ambulatory Visit (INDEPENDENT_AMBULATORY_CARE_PROVIDER_SITE_OTHER): Payer: Medicare Other | Admitting: Family Medicine

## 2015-05-01 VITALS — BP 146/82 | HR 72 | Temp 97.7°F | Ht 66.0 in | Wt 179.0 lb

## 2015-05-01 DIAGNOSIS — J209 Acute bronchitis, unspecified: Secondary | ICD-10-CM

## 2015-05-01 DIAGNOSIS — J441 Chronic obstructive pulmonary disease with (acute) exacerbation: Secondary | ICD-10-CM | POA: Diagnosis not present

## 2015-05-01 DIAGNOSIS — J432 Centrilobular emphysema: Secondary | ICD-10-CM | POA: Diagnosis not present

## 2015-05-01 DIAGNOSIS — J440 Chronic obstructive pulmonary disease with acute lower respiratory infection: Secondary | ICD-10-CM

## 2015-05-01 LAB — POCT CBC
Granulocyte percent: 66.7 %G (ref 37–80)
HCT, POC: 50.9 % (ref 43.5–53.7)
Hemoglobin: 15.8 g/dL (ref 14.1–18.1)
Lymph, poc: 2.2 (ref 0.6–3.4)
MCH, POC: 30.6 pg (ref 27–31.2)
MCHC: 31.1 g/dL — AB (ref 31.8–35.4)
MCV: 98.4 fL — AB (ref 80–97)
MID (cbc): 0.6 (ref 0–0.9)
MPV: 6.2 fL (ref 0–99.8)
POC Granulocyte: 5.6 (ref 2–6.9)
POC LYMPH PERCENT: 26.1 %L (ref 10–50)
POC MID %: 7.2 %M (ref 0–12)
Platelet Count, POC: 285 10*3/uL (ref 142–424)
RBC: 5.18 M/uL (ref 4.69–6.13)
RDW, POC: 16.4 %
WBC: 8.4 10*3/uL (ref 4.6–10.2)

## 2015-05-01 MED ORDER — METHYLPREDNISOLONE SODIUM SUCC 125 MG IJ SOLR
125.0000 mg | Freq: Once | INTRAMUSCULAR | Status: AC
  Administered 2015-05-01: 125 mg via INTRAMUSCULAR

## 2015-05-01 MED ORDER — IPRATROPIUM BROMIDE HFA 17 MCG/ACT IN AERS: 2.0000 | INHALATION_SPRAY | RESPIRATORY_TRACT | Status: DC | PRN

## 2015-05-01 MED ORDER — PREDNISONE 50 MG PO TABS: 50.0000 mg | ORAL_TABLET | Freq: Every day | ORAL | Status: DC

## 2015-05-01 MED ORDER — DOXYCYCLINE HYCLATE 50 MG PO CAPS: 50.0000 mg | ORAL_CAPSULE | Freq: Two times a day (BID) | ORAL | Status: DC

## 2015-05-01 MED ORDER — ALBUTEROL SULFATE HFA 108 (90 BASE) MCG/ACT IN AERS: 2.0000 | INHALATION_SPRAY | RESPIRATORY_TRACT | Status: DC | PRN

## 2015-05-01 MED ORDER — IPRATROPIUM BROMIDE 0.02 % IN SOLN
0.5000 mg | Freq: Once | RESPIRATORY_TRACT | Status: AC
  Administered 2015-05-01: 0.5 mg via RESPIRATORY_TRACT

## 2015-05-01 MED ORDER — ALBUTEROL SULFATE (2.5 MG/3ML) 0.083% IN NEBU
2.5000 mg | INHALATION_SOLUTION | Freq: Once | RESPIRATORY_TRACT | Status: AC
  Administered 2015-05-01: 2.5 mg via RESPIRATORY_TRACT

## 2015-05-01 NOTE — Patient Instructions (Signed)
Chronic Obstructive Pulmonary Disease Chronic obstructive pulmonary disease (COPD) is a common lung condition in which airflow from the lungs is limited. COPD is a general term that can be used to describe many different lung problems that limit airflow, including both chronic bronchitis and emphysema. If you have COPD, your lung function will probably never return to normal, but there are measures you can take to improve lung function and make yourself feel better.  CAUSES   Smoking (common).   Exposure to secondhand smoke.   Genetic problems.  Chronic inflammatory lung diseases or recurrent infections. SYMPTOMS   Shortness of breath, especially with physical activity.   Deep, persistent (chronic) cough with a large amount of thick mucus.   Wheezing.   Rapid breaths (tachypnea).   Gray or bluish discoloration (cyanosis) of the skin, especially in fingers, toes, or lips.   Fatigue.   Weight loss.   Frequent infections or episodes when breathing symptoms become much worse (exacerbations).   Chest tightness. DIAGNOSIS  Your health care provider will take a medical history and perform a physical examination to make the initial diagnosis. Additional tests for COPD may include:   Lung (pulmonary) function tests.  Chest X-ray.  CT scan.  Blood tests. TREATMENT  Treatment available to help you feel better when you have COPD includes:   Inhaler and nebulizer medicines. These help manage the symptoms of COPD and make your breathing more comfortable.  Supplemental oxygen. Supplemental oxygen is only helpful if you have a low oxygen level in your blood.   Exercise and physical activity. These are beneficial for nearly all people with COPD. Some people may also benefit from a pulmonary rehabilitation program. HOME CARE INSTRUCTIONS   Take all medicines (inhaled or pills) as directed by your health care provider.  Avoid over-the-counter medicines or cough syrups  that dry up your airway (such as antihistamines) and slow down the elimination of secretions unless instructed otherwise by your health care provider.   If you are a smoker, the most important thing that you can do is stop smoking. Continuing to smoke will cause further lung damage and breathing trouble. Ask your health care provider for help with quitting smoking. He or she can direct you to community resources or hospitals that provide support.  Avoid exposure to irritants such as smoke, chemicals, and fumes that aggravate your breathing.  Use oxygen therapy and pulmonary rehabilitation if directed by your health care provider. If you require home oxygen therapy, ask your health care provider whether you should purchase a pulse oximeter to measure your oxygen level at home.   Avoid contact with individuals who have a contagious illness.  Avoid extreme temperature and humidity changes.  Eat healthy foods. Eating smaller, more frequent meals and resting before meals may help you maintain your strength.  Stay active, but balance activity with periods of rest. Exercise and physical activity will help you maintain your ability to do things you want to do.  Preventing infection and hospitalization is very important when you have COPD. Make sure to receive all the vaccines your health care provider recommends, especially the pneumococcal and influenza vaccines. Ask your health care provider whether you need a pneumonia vaccine.  Learn and use relaxation techniques to manage stress.  Learn and use controlled breathing techniques as directed by your health care provider. Controlled breathing techniques include:   Pursed lip breathing. Start by breathing in (inhaling) through your nose for 1 second. Then, purse your lips as if you were   going to whistle and breathe out (exhale) through the pursed lips for 2 seconds.   Diaphragmatic breathing. Start by putting one hand on your abdomen just above  your waist. Inhale slowly through your nose. The hand on your abdomen should move out. Then purse your lips and exhale slowly. You should be able to feel the hand on your abdomen moving in as you exhale.   Learn and use controlled coughing to clear mucus from your lungs. Controlled coughing is a series of short, progressive coughs. The steps of controlled coughing are:  1. Lean your head slightly forward.  2. Breathe in deeply using diaphragmatic breathing.  3. Try to hold your breath for 3 seconds.  4. Keep your mouth slightly open while coughing twice.  5. Spit any mucus out into a tissue.  6. Rest and repeat the steps once or twice as needed. SEEK MEDICAL CARE IF:   You are coughing up more mucus than usual.   There is a change in the color or thickness of your mucus.   Your breathing is more labored than usual.   Your breathing is faster than usual.  SEEK IMMEDIATE MEDICAL CARE IF:   You have shortness of breath while you are resting.   You have shortness of breath that prevents you from:  Being able to talk.   Performing your usual physical activities.   You have chest pain lasting longer than 5 minutes.   Your skin color is more cyanotic than usual.  You measure low oxygen saturations for longer than 5 minutes with a pulse oximeter. MAKE SURE YOU:   Understand these instructions.  Will watch your condition.  Will get help right away if you are not doing well or get worse. Document Released: 09/13/2005 Document Revised: 04/20/2014 Document Reviewed: 07/31/2013 [facility] Patient Information 2015 [place], [place]. This information is not intended to replace advice given to you by your health care provider. Make sure you discuss any questions you have with your health care provider.  Asthma, Acute Bronchospasm Acute bronchospasm caused by asthma is also referred to as an asthma attack. Bronchospasm means your air passages become narrowed. The narrowing is  caused by inflammation and tightening of the muscles in the air tubes (bronchi) in your lungs. This can make it hard to breathe or cause you to wheeze and cough. CAUSES Possible triggers are:  Animal dander from the skin, hair, or feathers of animals.  Dust mites contained in house dust.  Cockroaches.  Pollen from trees or grass.  Mold.  Cigarette or tobacco smoke.  Air pollutants such as dust, household cleaners, hair sprays, aerosol sprays, paint fumes, strong chemicals, or strong odors.  Cold air or weather changes. Cold air may trigger inflammation. Winds increase molds and pollens in the air.  Strong emotions such as crying or laughing hard.  Stress.  Certain medicines such as aspirin or beta-blockers.  Sulfites in foods and drinks, such as dried fruits and wine.  Infections or inflammatory conditions, such as a flu, cold, or inflammation of the nasal membranes (rhinitis).  Gastroesophageal reflux disease (GERD). GERD is a condition where stomach acid backs up into your esophagus.  Exercise or strenuous activity. SIGNS AND SYMPTOMS   Wheezing.  Excessive coughing, particularly at night.  Chest tightness.  Shortness of breath. DIAGNOSIS  Your health care provider will ask you about your medical history and perform a physical exam. A chest X-ray or blood testing may be performed to look for other causes of your  symptoms or other conditions that may have triggered your asthma attack. TREATMENT  Treatment is aimed at reducing inflammation and opening up the airways in your lungs. Most asthma attacks are treated with inhaled medicines. These include quick relief or rescue medicines (such as bronchodilators) and controller medicines (such as inhaled corticosteroids). These medicines are sometimes given through an inhaler or a nebulizer. Systemic steroid medicine taken by mouth or given through an IV tube also can be used to reduce the inflammation when an attack is  moderate or severe. Antibiotic medicines are only used if a bacterial infection is present.  HOME CARE INSTRUCTIONS   Rest.  Drink plenty of liquids. This helps the mucus to remain thin and be easily coughed up. Only use caffeine in moderation and do not use alcohol until you have recovered from your illness.  Do not smoke. Avoid being exposed to secondhand smoke.  You play a critical role in keeping yourself in good health. Avoid exposure to things that cause you to wheeze or to have breathing problems.  Keep your medicines up-to-date and available. Carefully follow your health care provider's treatment plan.  Take your medicine exactly as prescribed.  When pollen or pollution is bad, keep windows closed and use an air conditioner or go to places with air conditioning.  Asthma requires careful medical care. See your health care provider for a follow-up as advised. If you are more than [redacted] weeks pregnant and you were prescribed any new medicines, let your obstetrician know about the visit and how you are doing. Follow up with your health care provider as directed.  After you have recovered from your asthma attack, make an appointment with your outpatient doctor to talk about ways to reduce the likelihood of future attacks. If you do not have a doctor who manages your asthma, make an appointment with a primary care doctor to discuss your asthma. SEEK IMMEDIATE MEDICAL CARE IF:   You are getting worse.  You have trouble breathing. If severe, call your local emergency services (911 in the [place]  You develop chest pain or discomfort.  You are vomiting.  You are not able to keep fluids down.  You are coughing up yellow, green, brown, or bloody sputum.  You have a fever and your symptoms suddenly get worse.  You have trouble swallowing. MAKE SURE YOU:   Understand these instructions.  Will watch your condition.  Will get help right away if you are not doing well or get  worse. Document Released: 03/21/2007 Document Revised: 12/09/2013 Document Reviewed: 06/11/2013 [facility] Patient Information 2015 [place], [place]. This information is not intended to replace advice given to you by your health care provider. Make sure you discuss any questions you have with your health care provider.

## 2015-05-01 NOTE — Progress Notes (Signed)
Subjective:    Patient ID: Kent Flowers, male    DOB: 11-11-1946, 69 y.o.   MRN: [id number] This chart was scribed for Delman Cheadle, MD by Marti Sleigh, Medical Scribe. This patient was seen in Room 14  and the patient's care was started a 10:30 AM.  Chief Complaint  Patient presents with  . chest congestion    Recurrent chest congestion & wheezing-was treated about a month ago & cleared up. Now returned a few days ago    HPI Past Medical History  Diagnosis Date  . Penile neoplasm 2014  . COPD (chronic obstructive pulmonary disease) with emphysema     PULMOLOGIST-- DR Tarri Fuller YOUNG  . Ascending aortic aneurysm     STABLE PER CHEST CT 09-26-2012  (4.3CM x 4.3CM)  . Chronic low back pain   . Arthritis   . Knee pain, bilateral     INTERMITTENT--  MENISCUS  . 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 Aortic calcification 11/03/2013  . Right knee DJD    Allergies  Allergen Reactions  . Ceclor [Cefaclor] Rash  . Sulfa Antibiotics Rash    SEVERE RASH, Childhood allergy    Current Outpatient Prescriptions on File Prior to Visit  Medication Sig Dispense Refill  . DULERA 200-5 MCG/ACT AERO INHALE 2 PUFFS INTO THE LUNGS TWICE DAILY 13 g 2  . Oxycodone HCl 10 MG TABS Take 10 mg by mouth 3 (three) times daily. OXY - IR    . PROAIR HFA 108 (90 BASE) MCG/ACT inhaler INHALE 1 OR 2 PUFFS INTO THE LUNGS EVERY 6 HOURS AS NEEDED FOR SHORTNESS OF BREATH OR WHEEZING 8.5 g 1  . ZIPSOR 25 MG CAPS Take 1 tablet by mouth 4 (four) times daily as needed. Normally takes around 3-4 pills a week     No current facility-administered medications on file prior to visit.    HPI Comments: Kent Flowers is a 69 y.o. male with a past hx of COPD who presents to [facility] complaining of SOB with associated chest congestion and wheezing for the last three days. Pt states the inhaler that he uses normally lasts him one month, but he used half of his inhaler in the last two days. Pt denies fever. Pt states he cannot  breath deeply. Pt states he has taken guaifenesin intermittently. Pt states he has not been taking his dulara because he forgets. Pt does not have a nebulizer machine at home. Pt endorses sleep disturbance. Pt states that other than the zpac, he has not had recent abx. Pt quit smoking in 2011.    Review of Systems  Constitutional: Negative for fever and chills.  Respiratory: Positive for cough, chest tightness, shortness of breath and wheezing.   Cardiovascular: Negative for chest pain and palpitations.       Objective:  Physical Exam  Constitutional: He is oriented to person, place, and time. He appears well-developed and well-nourished. No distress.  HENT:  Head: Normocephalic and atraumatic.  Bilateral cerumen impaction. Nose with purulent discharge. Oropharynx with erythema and postnasal drip.  Eyes: Pupils are equal, round, and reactive to light.  Neck: Normal range of motion. Neck supple. No thyromegaly present.  Normal thyroid. No anterior/supreclavicular/posterior adenopathy.  Cardiovascular: Normal rate, regular rhythm and normal heart sounds.   No murmur heard. Heart with RRR.  Pulmonary/Chest: He has wheezes.  Audible upper airway wheezing stridor. Decreased air movement throughout all lung fields. Inspiratory and expiratory wheezing.  Musculoskeletal: Normal range of motion.  Lymphadenopathy:  He has no cervical adenopathy.  Neurological: He is alert and oriented to person, place, and time. Coordination normal.  Skin: Skin is warm and dry. He is not diaphoretic.  Psychiatric: He has a normal mood and affect. His behavior is normal.  Nursing note and vitals reviewed.  On recheck after nebulizer and solumedrol air movement still significantly decreased with wheezing.  Filed Vitals:   05/01/15 0852  BP: 146/82  Pulse: 72  Temp: 97.7 F (36.5 C)  TempSrc: Oral  Height: 5\' 6"  (1.676 m)  Weight: 179 lb (81.194 kg)  SpO2: 95%    Results for orders placed or  performed in visit on 05/01/15  POCT CBC  Result Value Ref Range   WBC 8.4 4.6 - 10.2 K/uL   Lymph, poc 2.2 0.6 - 3.4   POC LYMPH PERCENT 26.1 10 - 50 %L   MID (cbc) 0.6 0 - 0.9   POC MID % 7.2 0 - 12 %M   POC Granulocyte 5.6 2 - 6.9   Granulocyte percent 66.7 37 - 80 %G   RBC 5.18 4.69 - 6.13 M/uL   Hemoglobin 15.8 14.1 - 18.1 g/dL   HCT, POC 50.9 43.5 - 53.7 %   MCV 98.4 (A) 80 - 97 fL   MCH, POC 30.6 27 - 31.2 pg   MCHC 31.1 (A) 31.8 - 35.4 g/dL   RDW, POC 16.4 %   Platelet Count, POC 285 142 - 424 K/uL   MPV 6.2 0 - 99.8 fL       Assessment & Plan:   1. Centrilobular emphysema   2. COPD with acute bronchitis     Orders Placed This Encounter  Procedures  . POCT CBC    Meds ordered this encounter  Medications  . albuterol (PROVENTIL) (2.5 MG/3ML) 0.083% nebulizer solution 2.5 mg    Sig:   . ipratropium (ATROVENT) nebulizer solution 0.5 mg    Sig:   . methylPREDNISolone sodium succinate (SOLU-MEDROL) 125 mg/2 mL injection 125 mg    Sig:   . DISCONTD: predniSONE (DELTASONE) 50 MG tablet    Sig: Take 1 tablet (50 mg total) by mouth daily with breakfast.    Dispense:  5 tablet    Refill:  0  . doxycycline (VIBRAMYCIN) 50 MG capsule    Sig: Take 1 capsule (50 mg total) by mouth 2 (two) times daily.    Dispense:  14 capsule    Refill:  0  . DISCONTD: albuterol (PROAIR HFA) 108 (90 BASE) MCG/ACT inhaler    Sig: Inhale 2 puffs into the lungs every 4 (four) hours as needed for wheezing or shortness of breath.    Dispense:  8.5 g    Refill:  1  . ipratropium (ATROVENT HFA) 17 MCG/ACT inhaler    Sig: Inhale 2 puffs into the lungs every 4 (four) hours as needed for wheezing.    Dispense:  1 Inhaler    Refill:  0  . predniSONE (DELTASONE) 50 MG tablet    Sig: Take 1 tablet (50 mg total) by mouth daily with breakfast.    Dispense:  5 tablet    Refill:  0  . albuterol (PROAIR HFA) 108 (90 BASE) MCG/ACT inhaler    Sig: Inhale 2 puffs into the lungs every 4 (four) hours  as needed for wheezing or shortness of breath.    Dispense:  8.5 g    Refill:  1    I personally performed the services described in this documentation, which  was scribed in my presence. The recorded information has been reviewed and considered, and addended by me as needed.  Delman Cheadle, MD MPH

## 2015-05-03 ENCOUNTER — Ambulatory Visit (INDEPENDENT_AMBULATORY_CARE_PROVIDER_SITE_OTHER): Payer: Medicare Other | Admitting: Internal Medicine

## 2015-05-03 ENCOUNTER — Encounter: Payer: Self-pay | Admitting: Internal Medicine

## 2015-05-03 VITALS — BP 122/84 | HR 97 | Ht 67.0 in | Wt 180.6 lb

## 2015-05-03 DIAGNOSIS — R911 Solitary pulmonary nodule: Secondary | ICD-10-CM

## 2015-05-03 DIAGNOSIS — J441 Chronic obstructive pulmonary disease with (acute) exacerbation: Secondary | ICD-10-CM | POA: Diagnosis not present

## 2015-05-03 DIAGNOSIS — J209 Acute bronchitis, unspecified: Secondary | ICD-10-CM

## 2015-05-03 DIAGNOSIS — J440 Chronic obstructive pulmonary disease with acute lower respiratory infection: Principal | ICD-10-CM

## 2015-05-03 MED ORDER — UMECLIDINIUM-VILANTEROL 62.5-25 MCG/INH IN AEPB: 1.0000 | INHALATION_SPRAY | Freq: Every day | RESPIRATORY_TRACT | Status: AC

## 2015-05-03 NOTE — Patient Instructions (Addendum)
Taper prednisone 10 mg tabs 3 today, 3 tomorrow, 2 on Wednesday, 2 on Thursday, then ok to stop and save the rest.  Sample Anoro Ellipta inhaler  1 puff once daily   Try this instead of Dulera  You can check with your insurance to see what their coverage is for Anoro, and compare that with Stiolto.

## 2015-05-03 NOTE — Progress Notes (Signed)
02/20/12- 84 yoM former smoker referred by Mathews Robinsons, PA-C/ [place] because of COPD. Spirometry 01/24/12- FVC 2.24/ 64%, FEV1 1.20/ 42%, FEV1/FVC 0.53  He considers himself normally an active and athletic person. In December of 2012 he became aware of shortness of breath walking 100 yards. This is either new or not noticed previously. He had some prednisone left over from an orthopedic visit so he tried that with no effect. In January he went to urgent care where he was diagnosed with pneumonia and treated with Levaquin x10 days with no effect. He denies fever, cough or phlegm. He was given a repeat prednisone taper from 60 mg which seemed to help modestly. Prednisone caused weight gain and nervousness but did seem to make a big difference after a third round, which ended 3 weeks ago.Marland Kitchen He says he is now breathing "phenomenally better". He is still not back to normal exercise tolerance. He notices wheeze when he is short of breath with exertion. He was given Spiriva but decided not to try until he was seen here because he has a history of urinary outlet obstruction. He was also given a sample of Dulera but uncertain effect. For 4 or 5 years he has used a Primatene inhaler for morning shortness of breath occasionally. He did not find a rescue albuterol inhaler as effective. He doesn't know about past history of pneumonia but denies TB exposure. There is no family history of DVT or pulmonary embolism. Mother was a smoker who died at age 8 of COPD. He denies personal history of heart disease or symptoms of chest pain or palpitation. 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    05/17/12- 38 yoM former smoker referred by Mathews Robinsons, PA-C/ [place] because of COPD. Patient states is better. c/o sob with exertion, wheezing, and cough. Denies chest pain and chest tightness.  CAT COPD assessment test-8/40. Describes the morning cough with thick white  sputum. He tried off [place] but decided it helped him. He feels his shortness of breath is almost back to his baseline of last fall. CT chest 05/02/12- reviewed with him IMPRESSION:  No evidence of pulmonary embolism.  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  Scattered atherosclerotic disease.  Question pulmonary arterial hypertension.  Minimal patchy central infiltrates in right upper right lower lobes  with a questionable 8 mm diameter stellate density versus scarring  in the right middle lobe.  If the patient has prior outside CT exams, recommend these be  obtained for comparison to establish stability.  In the absence of prior exams, recommend referral to the  [facility] for management.  Original Report Authenticated By: Burnetta Sabin, M.D.   11/04/12- 39 yoM former smoker followed for COPD, hx lung nodule. FOLLOWS FOR: sob and wheezing at times-mainly in the mornings (does physical work during the day) Declines flu vaccine. 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 Minor cough. Since his exacerbation last year he has improved significantly but still more dyspnea with exertion than he remembers. Wakes each morning with chest congestion, morning cough with clear mucus. Uses Ventolin rescue inhaler twice daily and continues Dulera 100.  Original Report Authenticated By: Arline Asp, M.D.   05/06/13- 89 yoM former smoker followed for COPD, hx lung nodule. FOLLOWS FOR: most every morning has congestion and able to bring up clear/white phlegm. Never  fully recovered exercise tolerance after viral illness in 2013 but proud to be physically active at work. CXR 02/02/13 IMPRESSION:  Apparent interval resolution of previously seen right middle lobe  nodule.  Stable evidence of emphysema.  No new acute abnormality or other abnormality.  Stable ascending aneurysmal ectasia.   Original Report Authenticated By: Arline Asp, M.D.  10/07/13- 36 yoM former smoker followed for COPD, hx lung nodule. FOLLOWS FOR: went back on Dulera-uses 1-2 puffs qd instead and is cheaper than using Breo daily-couldn't tell much difference Denies chest pain, bloody or purulent sputum. Comfortable with his medications. We discussed flu vaccine and pneumococcal vaccine. CT chest indicated small lung nodules are likely benign and can be followed conservatively. There is some emphysema and airway changes of chronic bronchitis. He has continued to work. Activities limited by pain in his knee and he is now pending knee replacement. PFT: 03/19/2012-moderate obstructive airways disease, insignificant response to bronchodilator , air trapping,normal diffusion capacity. Loop contour suggests emphysema. FVC 3.73/91%, FEV1 2.07/73%, FEV1/FEC 0.56. TLC 114%, RV 138% , DLCO 90%. CT chest 10/01/13 IMPRESSION:  1. Visualized small lung nodules are benign based on current  consensus criteria and do not require further workup.  2. Emphysema with airway thickening.  3. Stable ectatic ascending thoracic aorta.  Electronically Signed  By: Sherryl Barters M.D.  On: 10/01/2013 12:03  02/06/14- 4 yoM former smoker followed for COPD, hx lung nodules. ACUTE VISIT:  Increased sob, wheezing and cough with clear mucus x3 weeks.  Also, increased use of rescue inhaler up to 5 times a day Never got TKR, got dental work done first. Now acute increased shortness of breath x3 weeks. Intermittent discomfort right upper anterior chest, not exertional.  04/07/14- 67 yoM former smoker followed for COPD, hx lung nodules. FOLLOWS FOR: Pt states he feels better since last visit-no more flare ups or trouble breathing. Doing very well, using rescue inhaler once a day, continues [facility]  06/29/14- 67 yoM former smoker followed for COPD, hx lung nodules. ACUTE VISIT:  Coughing up green mucus, sore throat and some fever. One  day-fever, sore throat, green sputum. Had been visiting [place] and exposed to a lot of people.  10/08/2014 Acute OV  63 yoM former smoker followed for COPD, hx lung nodules. Complains of increased cough, wheezing and thick mucus with increased SOB for 1 week .  No otc used for tx.  No fever, hemoptysis , chest pain, orthopnea or edema.  No recent travel or abx use.  Not using Dulera on regular basis. We talked med compliance and controller use.  Has had increased SABA use.   05/03/15- 67 yoM former smoker followed for COPD, hx lung nodules. FOLLOWS FOR: Pt went to [facility] recently for flare up; given prednisone(didnt take today), doxycycline(didnt start) and Atrovent HFA(didnt start). 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 He has Dulera 200 but tends not to use it. Usually uses his rescue inhaler once daily. He says he is "85% back to normal".  ROS-see HPI Constitutional:   No-   weight loss, night sweats,  chills, fatigue, lassitude. HEENT:   No-  headaches, difficulty swallowing, tooth/dental problems,        No-  sneezing, itching, ear ache,  +nasal congestion, post nasal drip,  CV:  No- chest pain, orthopnea, PND, swelling in lower extremities, anasarca,  dizziness, palpitations Resp: +   shortness of breath with exertion  or at rest.              +productive cough,no- non-productive cough,  No- coughing up of blood.             No change in color of mucus.  + wheezing.   Skin: No-   rash or lesions. GI:  No-   heartburn, indigestion, abdominal pain, nausea, vomiting GU: No-   dysuria,. MS:  +  joint pain or swelling.     Neuro-     nothing unusual Psych:  No- change in mood or affect. No depression or anxiety.  No memory loss.  OBJ- Physical Exam GEN: A/Ox3; pleasant , NAD, well nourished   HEENT:  [place]/AT,  EACs-clear, TMs-wnl, NOSE-clear, THROAT-clear, no lesions, no postnasal drip or exudate  noted.   NECK:  Supple w/ fair ROM; no JVD; normal carotid impulses w/o bruits; no thyromegaly or nodules palpated; no lymphadenopathy.  RESP  Exp wheezing noted no accessory muscle use, no dullness to percussion  CARD:  RRR, no m/r/g  , no peripheral edema, pulses intact, no cyanosis or clubbing.  GI:   Soft & nt; nml bowel sounds; no organomegaly or masses detected.  Musco: Warm bil, no deformities or joint swelling noted.   Neuro: alert, no focal deficits noted.    Skin: Warm, no lesions or rashes

## 2015-05-14 ENCOUNTER — Telehealth: Payer: Self-pay | Admitting: Internal Medicine

## 2015-05-14 MED ORDER — UMECLIDINIUM-VILANTEROL 62.5-25 MCG/INH IN AEPB: 1.0000 | INHALATION_SPRAY | Freq: Every day | RESPIRATORY_TRACT | Status: DC

## 2015-05-14 NOTE — Telephone Encounter (Signed)
Anoro rx sent to pt's requested pharmacy. Nothing further needed at this time.

## 2015-05-23 NOTE — Assessment & Plan Note (Signed)
Small nodules consistent with benign etiology based on 2014 chest CT

## 2015-05-23 NOTE — Assessment & Plan Note (Signed)
Acute exacerbation due to irritation from concrete dust. Exline plan-continue meds as discussed. Uses a dust mask when appropriate at work.

## 2015-06-14 DIAGNOSIS — M19041 Primary osteoarthritis, right hand: Secondary | ICD-10-CM | POA: Diagnosis not present

## 2015-06-14 DIAGNOSIS — M170 Bilateral primary osteoarthritis of knee: Secondary | ICD-10-CM | POA: Diagnosis not present

## 2015-07-16 ENCOUNTER — Other Ambulatory Visit: Payer: Self-pay | Admitting: Family Medicine

## 2015-07-16 NOTE — Telephone Encounter (Signed)
Last office visit 03/17/2015.  Alprazolam not on current medication list.  Refill?

## 2015-07-18 NOTE — Telephone Encounter (Signed)
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

## 2015-07-19 DIAGNOSIS — L570 Actinic keratosis: Secondary | ICD-10-CM | POA: Diagnosis not present

## 2015-07-19 DIAGNOSIS — L821 Other seborrheic keratosis: Secondary | ICD-10-CM | POA: Diagnosis not present

## 2015-07-19 NOTE — Telephone Encounter (Signed)
Called into [facility].

## 2015-08-24 ENCOUNTER — Other Ambulatory Visit: Payer: Self-pay | Admitting: Family Medicine

## 2015-08-25 DIAGNOSIS — M170 Bilateral primary osteoarthritis of knee: Secondary | ICD-10-CM | POA: Diagnosis not present

## 2015-09-02 DIAGNOSIS — M170 Bilateral primary osteoarthritis of knee: Secondary | ICD-10-CM | POA: Diagnosis not present

## 2015-09-09 DIAGNOSIS — M170 Bilateral primary osteoarthritis of knee: Secondary | ICD-10-CM | POA: Diagnosis not present

## 2015-09-16 DIAGNOSIS — M170 Bilateral primary osteoarthritis of knee: Secondary | ICD-10-CM | POA: Diagnosis not present

## 2015-09-23 DIAGNOSIS — M170 Bilateral primary osteoarthritis of knee: Secondary | ICD-10-CM | POA: Diagnosis not present

## 2015-09-30 DIAGNOSIS — H903 Sensorineural hearing loss, bilateral: Secondary | ICD-10-CM | POA: Diagnosis not present

## 2015-09-30 DIAGNOSIS — H6122 Impacted cerumen, left ear: Secondary | ICD-10-CM | POA: Diagnosis not present

## 2015-09-30 DIAGNOSIS — H6062 Unspecified chronic otitis externa, left ear: Secondary | ICD-10-CM | POA: Diagnosis not present

## 2015-10-07 DIAGNOSIS — H903 Sensorineural hearing loss, bilateral: Secondary | ICD-10-CM | POA: Diagnosis not present

## 2015-10-07 DIAGNOSIS — H9311 Tinnitus, right ear: Secondary | ICD-10-CM | POA: Diagnosis not present

## 2015-10-08 DIAGNOSIS — E291 Testicular hypofunction: Secondary | ICD-10-CM | POA: Diagnosis not present

## 2015-10-08 DIAGNOSIS — N400 Enlarged prostate without lower urinary tract symptoms: Secondary | ICD-10-CM | POA: Diagnosis not present

## 2015-10-14 DIAGNOSIS — N400 Enlarged prostate without lower urinary tract symptoms: Secondary | ICD-10-CM | POA: Diagnosis not present

## 2015-10-14 DIAGNOSIS — E291 Testicular hypofunction: Secondary | ICD-10-CM | POA: Diagnosis not present

## 2015-10-14 DIAGNOSIS — N5201 Erectile dysfunction due to arterial insufficiency: Secondary | ICD-10-CM | POA: Diagnosis not present

## 2015-12-27 ENCOUNTER — Telehealth: Payer: Self-pay | Admitting: *Deleted

## 2015-12-27 MED ORDER — UMECLIDINIUM-VILANTEROL 62.5-25 MCG/INH IN AEPB: 1.0000 | INHALATION_SPRAY | Freq: Every day | RESPIRATORY_TRACT | Status: DC

## 2015-12-27 NOTE — Telephone Encounter (Signed)
rx refilled. Nothing further needed.

## 2016-01-25 ENCOUNTER — Telehealth: Payer: Self-pay | Admitting: Internal Medicine

## 2016-01-25 MED ORDER — ALBUTEROL SULFATE HFA 108 (90 BASE) MCG/ACT IN AERS: INHALATION_SPRAY | RESPIRATORY_TRACT | Status: DC

## 2016-01-25 NOTE — Telephone Encounter (Signed)
I called spoke with pt. Aware refill proair has been sent in. Nothing further needed

## 2016-02-23 DIAGNOSIS — M47816 Spondylosis without myelopathy or radiculopathy, lumbar region: Secondary | ICD-10-CM | POA: Diagnosis not present

## 2016-02-23 DIAGNOSIS — M1712 Unilateral primary osteoarthritis, left knee: Secondary | ICD-10-CM | POA: Diagnosis not present

## 2016-03-03 DIAGNOSIS — M545 Low back pain: Secondary | ICD-10-CM | POA: Diagnosis not present

## 2016-03-22 DIAGNOSIS — M545 Low back pain: Secondary | ICD-10-CM | POA: Diagnosis not present

## 2016-03-22 DIAGNOSIS — M4806 Spinal stenosis, lumbar region: Secondary | ICD-10-CM | POA: Diagnosis not present

## 2016-03-22 DIAGNOSIS — M47816 Spondylosis without myelopathy or radiculopathy, lumbar region: Secondary | ICD-10-CM | POA: Diagnosis not present

## 2016-04-27 ENCOUNTER — Ambulatory Visit (INDEPENDENT_AMBULATORY_CARE_PROVIDER_SITE_OTHER): Payer: Medicare Other | Admitting: Internal Medicine

## 2016-04-27 ENCOUNTER — Encounter: Payer: Self-pay | Admitting: Internal Medicine

## 2016-04-27 VITALS — BP 128/80 | HR 77 | Ht 67.0 in | Wt 175.8 lb

## 2016-04-27 DIAGNOSIS — J440 Chronic obstructive pulmonary disease with acute lower respiratory infection: Secondary | ICD-10-CM

## 2016-04-27 DIAGNOSIS — J209 Acute bronchitis, unspecified: Secondary | ICD-10-CM

## 2016-04-27 MED ORDER — GLYCOPYRROLATE-FORMOTEROL 9-4.8 MCG/ACT IN AERO: 2.0000 | INHALATION_SPRAY | Freq: Two times a day (BID) | RESPIRATORY_TRACT | Status: DC

## 2016-04-27 NOTE — Progress Notes (Signed)
02/20/12- 70 yoM former smoker referred by Mathews Robinsons, PA-C/ [place] because of COPD. Spirometry 01/24/12- FVC 2.24/ 64%, FEV1 1.20/ 42%, FEV1/FVC 0.53  He considers himself normally an active and athletic person. In December of 2012 he became aware of shortness of breath walking 100 yards. This is either new or not noticed previously. He had some prednisone left over from an orthopedic visit so he tried that with no effect. In January he went to urgent care where he was diagnosed with pneumonia and treated with Levaquin x10 days with no effect. He denies fever, cough or phlegm. He was given a repeat prednisone taper from 60 mg which seemed to help modestly. Prednisone caused weight gain and nervousness but did seem to make a big difference after a third round, which ended 3 weeks ago.Marland Kitchen He says he is now breathing "phenomenally better". He is still not back to normal exercise tolerance. He notices wheeze when he is short of breath with exertion. He was given Spiriva but decided not to try until he was seen here because he has a history of urinary outlet obstruction. He was also given a sample of Dulera but uncertain effect. For 4 or 5 years he has used a Primatene inhaler for morning shortness of breath occasionally. He did not find a rescue albuterol inhaler as effective. He doesn't know about past history of pneumonia but denies TB exposure. There is no family history of DVT or pulmonary embolism. Mother was a smoker who died at age 8 of COPD. He denies personal history of heart disease or symptoms of chest pain or palpitation. 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    05/17/12- 70 yoM former smoker referred by Mathews Robinsons, PA-C/ [place] because of COPD. Patient states is better. c/o sob with exertion, wheezing, and cough. Denies chest pain and chest tightness.  CAT COPD assessment test-8/40. Describes the morning cough with thick white  sputum. He tried off [place] but decided it helped him. He feels his shortness of breath is almost back to his baseline of last fall. CT chest 05/02/12- reviewed with him IMPRESSION:  No evidence of pulmonary embolism.  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  Scattered atherosclerotic disease.  Question pulmonary arterial hypertension.  Minimal patchy central infiltrates in right upper right lower lobes  with a questionable 8 mm diameter stellate density versus scarring  in the right middle lobe.  If the patient has prior outside CT exams, recommend these be  obtained for comparison to establish stability.  In the absence of prior exams, recommend referral to the  [facility] for management.  Original Report Authenticated By: Burnetta Sabin, M.D.   11/04/12- 70 yoM former smoker followed for COPD, hx lung nodule. FOLLOWS FOR: sob and wheezing at times-mainly in the mornings (does physical work during the day) Declines flu vaccine. 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 Minor cough. Since his exacerbation last year he has improved significantly but still more dyspnea with exertion than he remembers. Wakes each morning with chest congestion, morning cough with clear mucus. Uses Ventolin rescue inhaler twice daily and continues Dulera 100.  Original Report Authenticated By: Arline Asp, M.D.   05/06/13- 70 yoM former smoker followed for COPD, hx lung nodule. FOLLOWS FOR: most every morning has congestion and able to bring up clear/white phlegm. Never  fully recovered exercise tolerance after viral illness in 2013 but proud to be physically active at work. CXR 02/02/13 IMPRESSION:  Apparent interval resolution of previously seen right middle lobe  nodule.  Stable evidence of emphysema.  No new acute abnormality or other abnormality.  Stable ascending aneurysmal ectasia.   Original Report Authenticated By: Arline Asp, M.D.  10/07/13- 70 yoM former smoker followed for COPD, hx lung nodule. FOLLOWS FOR: went back on Dulera-uses 1-2 puffs qd instead and is cheaper than using Breo daily-couldn't tell much difference Denies chest pain, bloody or purulent sputum. Comfortable with his medications. We discussed flu vaccine and pneumococcal vaccine. CT chest indicated small lung nodules are likely benign and can be followed conservatively. There is some emphysema and airway changes of chronic bronchitis. He has continued to work. Activities limited by pain in his knee and he is now pending knee replacement. PFT: 03/19/2012-moderate obstructive airways disease, insignificant response to bronchodilator , air trapping,normal diffusion capacity. Loop contour suggests emphysema. FVC 3.73/91%, FEV1 2.07/73%, FEV1/FEC 0.56. TLC 114%, RV 138% , DLCO 90%. CT chest 10/01/13 IMPRESSION:  1. Visualized small lung nodules are benign based on current  consensus criteria and do not require further workup.  2. Emphysema with airway thickening.  3. Stable ectatic ascending thoracic aorta.  Electronically Signed  By: Sherryl Barters M.D.  On: 10/01/2013 12:03  02/06/14- 70 yoM former smoker followed for COPD, hx lung nodules. ACUTE VISIT:  Increased sob, wheezing and cough with clear mucus x3 weeks.  Also, increased use of rescue inhaler up to 5 times a day Never got TKR, got dental work done first. Now acute increased shortness of breath x3 weeks. Intermittent discomfort right upper anterior chest, not exertional.  04/07/14- 67 yoM former smoker followed for COPD, hx lung nodules. FOLLOWS FOR: Pt states he feels better since last visit-no more flare ups or trouble breathing. Doing very well, using rescue inhaler once a day, continues [facility]  06/29/14- 67 yoM former smoker followed for COPD, hx lung nodules. ACUTE VISIT:  Coughing up green mucus, sore throat and some fever. One  day-fever, sore throat, green sputum. Had been visiting [place] and exposed to a lot of people.  10/08/2014 Acute OV  70 yoM former smoker followed for COPD, hx lung nodules. Complains of increased cough, wheezing and thick mucus with increased SOB for 1 week .  No otc used for tx.  No fever, hemoptysis , chest pain, orthopnea or edema.  No recent travel or abx use.  Not using Dulera on regular basis. We talked med compliance and controller use.  Has had increased SABA use.   05/03/15- 67 yoM former smoker followed for COPD, hx lung nodules. FOLLOWS FOR: Pt went to [facility] recently for flare up; given prednisone(didnt take today), doxycycline(didnt start) and Atrovent HFA(didnt start). 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 He has Dulera 200 but tends not to use it. Usually uses his rescue inhaler once daily. He says he is "85% back to normal".  04/27/2016-70 year old male former smoker followed for COPD, history lung nodules FOLLOWS FOR: Recent flare up -lasted about 4 weeks or so; Pt took Mucinex and rescue inhaler, also took Volatren to help as well.   ROS-see HPI Constitutional:   No-   weight loss, night sweats,  chills, fatigue, lassitude. HEENT:   No-  headaches, difficulty swallowing, tooth/dental problems,        No-  sneezing, itching, ear ache,  +nasal congestion, post nasal drip,  CV:  No- chest pain, orthopnea, PND, swelling in lower extremities, anasarca,  dizziness, palpitations Resp: +   shortness of breath with exertion or at rest.              +productive cough,no- non-productive cough,  No- coughing up of blood.             No change in color of mucus.  + wheezing.   Skin: No-   rash or lesions. GI:  No-   heartburn, indigestion, abdominal pain, nausea, vomiting GU: No-   dysuria,. MS:  +  joint pain or swelling.     Neuro-     nothing unusual Psych:  No- change in mood or affect. No  depression or anxiety.  No memory loss.  OBJ- Physical Exam GEN: A/Ox3; pleasant , NAD, well nourished   HEENT:  /AT,  EACs-clear, TMs-wnl, NOSE-clear, THROAT-clear, no lesions, no postnasal drip or exudate noted.   NECK:  Supple w/ fair ROM; no JVD; normal carotid impulses w/o bruits; no thyromegaly or nodules palpated; no lymphadenopathy.  RESP  Exp wheezing noted no accessory muscle use, no dullness to percussion  CARD:  RRR, no m/r/g  , no peripheral edema, pulses intact, no cyanosis or clubbing.  GI:   Soft & nt; nml bowel sounds; no organomegaly or masses detected.  Musco: Warm bil, no deformities or joint swelling noted.   Neuro: alert, no focal deficits noted.    Skin: Warm, no lesions or rashes

## 2016-04-27 NOTE — Patient Instructions (Signed)
Sample Bivespi maintenance control inhaler       Inhale 2 puffs, twice daily    See if you have any feeling this is better than [facility]  Check your insurance coverage: meds like Bevespi include Stiolto, Anoro, Utibron                                                        meds like Dulera include Advair, Symbicort, Breo  Please call as needed

## 2016-04-27 NOTE — Progress Notes (Signed)
Patient ID: Kent Flowers, male   DOB: 05-04-1946, 70 y.o.   MRN: [id number]  Patient seen in the office today and instructed on use of bevespi.  Patient expressed understanding and demonstrated technique.

## 2016-05-01 DIAGNOSIS — M47816 Spondylosis without myelopathy or radiculopathy, lumbar region: Secondary | ICD-10-CM | POA: Diagnosis not present

## 2016-05-12 ENCOUNTER — Emergency Department (HOSPITAL_COMMUNITY): Payer: Medicare Other

## 2016-05-12 ENCOUNTER — Ambulatory Visit (INDEPENDENT_AMBULATORY_CARE_PROVIDER_SITE_OTHER): Payer: Medicare Other | Admitting: Physician Assistant

## 2016-05-12 ENCOUNTER — Emergency Department (HOSPITAL_COMMUNITY)
Admission: EM | Admit: 2016-05-12 | Discharge: 2016-05-12 | Disposition: A | Discharge status: Home / Self Care | Payer: Medicare Other | Attending: Emergency Medicine | Admitting: Emergency Medicine

## 2016-05-12 ENCOUNTER — Encounter (HOSPITAL_COMMUNITY): Payer: Self-pay | Admitting: Nurse Practitioner

## 2016-05-12 VITALS — BP 147/88 | HR 79 | Temp 97.8°F | Resp 18 | Ht 66.0 in | Wt 180.0 lb

## 2016-05-12 DIAGNOSIS — Z8589 Personal history of malignant neoplasm of other organs and systems: Secondary | ICD-10-CM | POA: Insufficient documentation

## 2016-05-12 DIAGNOSIS — M1711 Unilateral primary osteoarthritis, right knee: Secondary | ICD-10-CM | POA: Diagnosis not present

## 2016-05-12 DIAGNOSIS — R05 Cough: Secondary | ICD-10-CM | POA: Diagnosis not present

## 2016-05-12 DIAGNOSIS — Z87891 Personal history of nicotine dependence: Secondary | ICD-10-CM | POA: Diagnosis not present

## 2016-05-12 DIAGNOSIS — R0600 Dyspnea, unspecified: Secondary | ICD-10-CM | POA: Diagnosis not present

## 2016-05-12 DIAGNOSIS — J441 Chronic obstructive pulmonary disease with (acute) exacerbation: Secondary | ICD-10-CM | POA: Insufficient documentation

## 2016-05-12 DIAGNOSIS — R0602 Shortness of breath: Secondary | ICD-10-CM | POA: Diagnosis not present

## 2016-05-12 DIAGNOSIS — R062 Wheezing: Secondary | ICD-10-CM

## 2016-05-12 DIAGNOSIS — R0609 Other forms of dyspnea: Secondary | ICD-10-CM

## 2016-05-12 DIAGNOSIS — Z791 Long term (current) use of non-steroidal anti-inflammatories (NSAID): Secondary | ICD-10-CM | POA: Insufficient documentation

## 2016-05-12 DIAGNOSIS — R0682 Tachypnea, not elsewhere classified: Secondary | ICD-10-CM | POA: Diagnosis not present

## 2016-05-12 DIAGNOSIS — Z79891 Long term (current) use of opiate analgesic: Secondary | ICD-10-CM | POA: Insufficient documentation

## 2016-05-12 IMAGING — CR DG CHEST 2V
2 series · 2 of 2 positions shown · non-contrast
Comparison: CT [DATE]

CLINICAL DATA: SOB since last night. Chest congestion-coughing to
get up phlegm x 3 days COPD, h/o ascending aortic aneurysm, former
smoker x about 5 years ago No sx

EXAM:
CHEST  2 VIEW

[w chest pa]
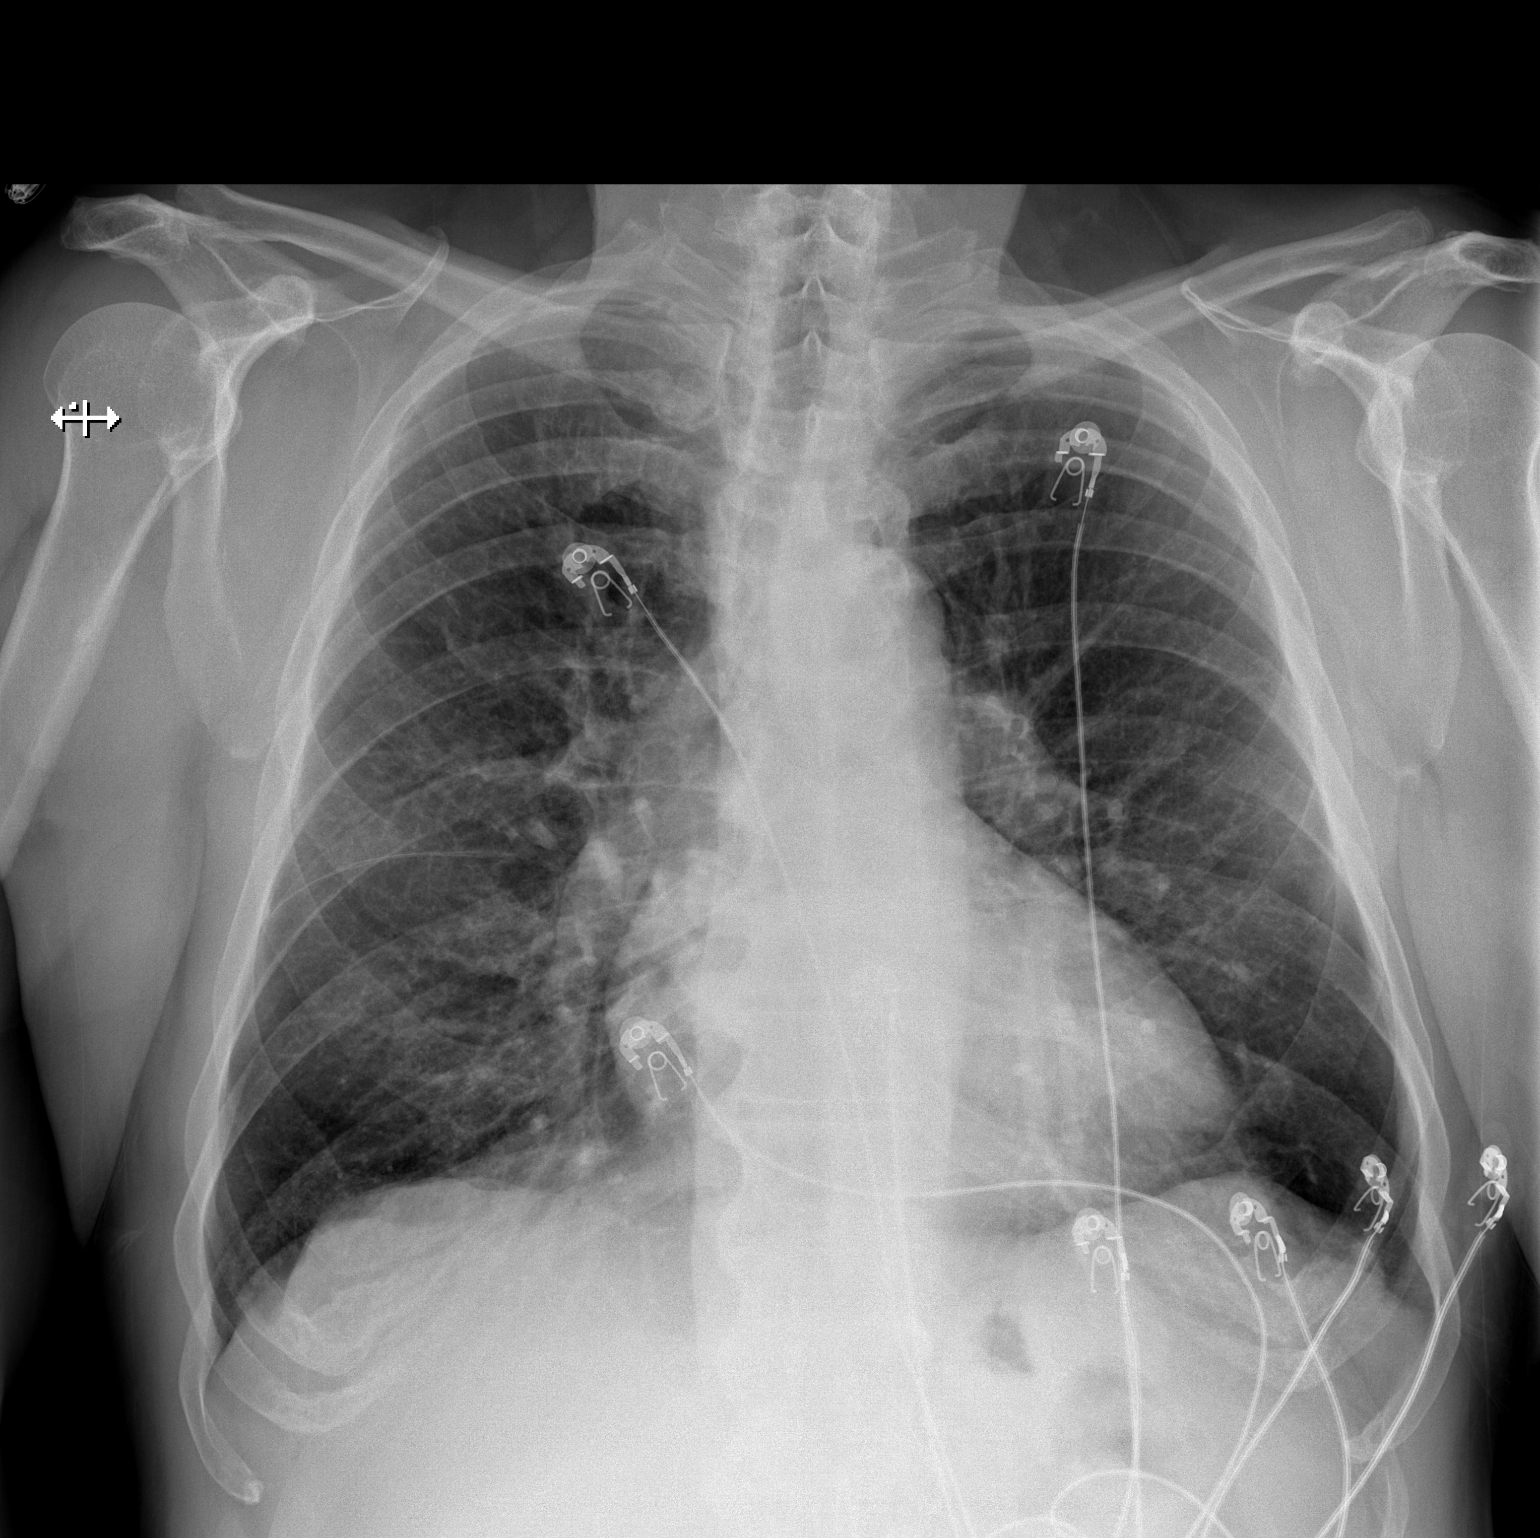

[w chest lat]
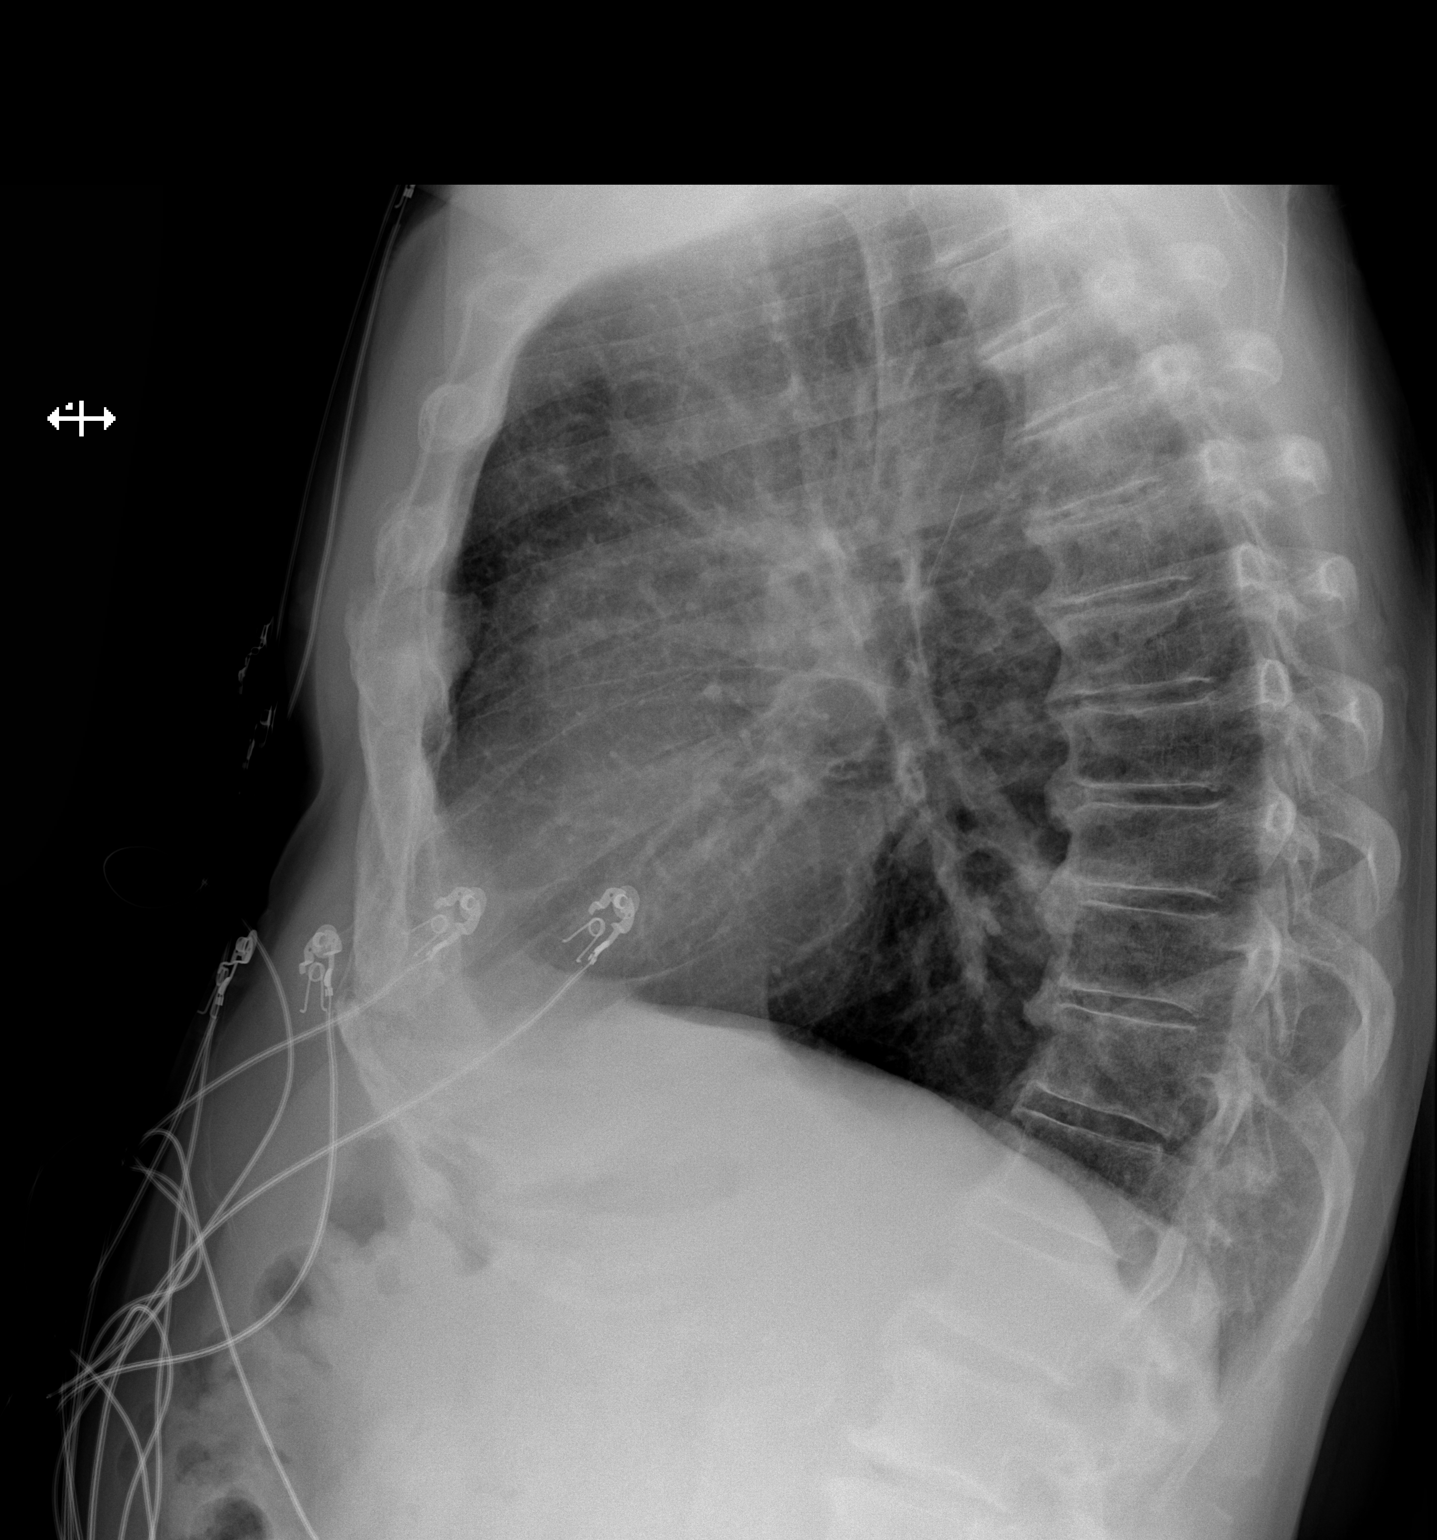

[2 of 2 positions shown; findings below may reference images not displayed]

FINDINGS: Minimal pectus excavatum deformity. Moderate thoracic spondylosis.
Numerous leads and wires project over the chest. Midline trachea.
Normal heart size and mediastinal contours. No pleural effusion or
pneumothorax. Diffuse peribronchial thickening. No lobar
consolidation. Possible mild scarring at the left lung base
medially.
IMPRESSION: No acute cardiopulmonary disease.

Peribronchial thickening which may relate to chronic bronchitis or
smoking.

## 2016-05-12 MED ORDER — METHYLPREDNISOLONE SODIUM SUCC 125 MG IJ SOLR
125.0000 mg | Freq: Once | INTRAMUSCULAR | Status: AC
  Administered 2016-05-12: 125 mg via INTRAVENOUS

## 2016-05-12 MED ORDER — IPRATROPIUM BROMIDE 0.02 % IN SOLN
0.5000 mg | Freq: Once | RESPIRATORY_TRACT | Status: AC
  Administered 2016-05-12: 0.5 mg via RESPIRATORY_TRACT
  Filled 2016-05-12: qty 2.5

## 2016-05-12 MED ORDER — ALBUTEROL SULFATE (2.5 MG/3ML) 0.083% IN NEBU
2.5000 mg | INHALATION_SOLUTION | Freq: Once | RESPIRATORY_TRACT | Status: AC
Start: 2016-05-12 — End: 2016-05-12
  Administered 2016-05-12: 2.5 mg via RESPIRATORY_TRACT

## 2016-05-12 MED ORDER — ALBUTEROL SULFATE (2.5 MG/3ML) 0.083% IN NEBU
2.5000 mg | INHALATION_SOLUTION | Freq: Once | RESPIRATORY_TRACT | Status: AC
  Administered 2016-05-12: 2.5 mg via RESPIRATORY_TRACT

## 2016-05-12 MED ORDER — PREDNISONE 10 MG PO TABS: ORAL_TABLET | ORAL | Status: DC

## 2016-05-12 MED ORDER — ALBUTEROL SULFATE (2.5 MG/3ML) 0.083% IN NEBU
5.0000 mg | INHALATION_SOLUTION | Freq: Once | RESPIRATORY_TRACT | Status: AC
  Administered 2016-05-12: 5 mg via RESPIRATORY_TRACT
  Filled 2016-05-12: qty 6

## 2016-05-12 MED ORDER — IPRATROPIUM BROMIDE 0.02 % IN SOLN
0.5000 mg | Freq: Once | RESPIRATORY_TRACT | Status: AC
  Administered 2016-05-12: 0.5 mg via RESPIRATORY_TRACT

## 2016-05-12 NOTE — ED Notes (Signed)
Peak flow, 200.

## 2016-05-12 NOTE — ED Notes (Signed)
Bed: [id number] Expected date:  Expected time:  Means of arrival:  Comments: 70 y/o m SOB

## 2016-05-12 NOTE — Discharge Instructions (Signed)
Medications: Prednisone  Treatment: Take prednisone as prescribed for 12 days. Continue your at home medications. Use your albuterol inhaler as needed for shortness of breath.  Follow-up: Please follow-up with your primary care provider for follow-up of today's visit. Please return to the emergency department if you develop any new or worsening symptoms.   Chronic Obstructive Pulmonary Disease Exacerbation Chronic obstructive pulmonary disease (COPD) is a common lung condition in which airflow from the lungs is limited. COPD is a general term that can be used to describe many different lung problems that limit airflow, including chronic bronchitis and emphysema. COPD exacerbations are episodes when breathing symptoms become much worse and require extra treatment. Without treatment, COPD exacerbations can be life threatening, and frequent COPD exacerbations can cause further damage to your lungs. CAUSES  Respiratory infections.  Exposure to smoke.  Exposure to air pollution, chemical fumes, or dust. Sometimes there is no apparent cause or trigger. RISK FACTORS  Smoking cigarettes.  Older age.  Frequent prior COPD exacerbations. SIGNS AND SYMPTOMS  Increased coughing.  Increased thick spit (sputum) production.  Increased wheezing.  Increased shortness of breath.  Rapid breathing.  Chest tightness. DIAGNOSIS Your medical history, a physical exam, and tests will help your health care provider make a diagnosis. Tests may include:  A chest X-ray.  Basic lab tests.  Sputum testing.  An arterial blood gas test. TREATMENT Depending on the severity of your COPD exacerbation, you may need to be admitted to a hospital for treatment. Some of the treatments commonly used to treat COPD exacerbations are:   Antibiotic medicines.  Bronchodilators. These are drugs that expand the air passages. They may be given with an inhaler or nebulizer. Spacer devices may be needed to help  improve drug delivery.  Corticosteroid medicines.  Supplemental oxygen therapy.  Airway clearing techniques, such as noninvasive ventilation (NIV) and positive expiratory pressure (PEP). These provide respiratory support through a mask or other noninvasive device. HOME CARE INSTRUCTIONS  Do not smoke. Quitting smoking is very important to prevent COPD from getting worse and exacerbations from happening as often.  Avoid exposure to all substances that irritate the airway, especially to tobacco smoke.  If you were prescribed an antibiotic medicine, finish it all even if you start to feel better.  Take all medicines as directed by your health care provider.It is important to use correct technique with inhaled medicines.  Drink enough fluids to keep your urine clear or pale yellow (unless you have a medical condition that requires fluid restriction).  Use a cool mist vaporizer. This makes it easier to clear your chest when you cough.  If you have a home nebulizer and oxygen, continue to use them as directed.  Maintain all necessary vaccinations to prevent infections.  Exercise regularly.  Eat a healthy diet.  Keep all follow-up appointments as directed by your health care provider. SEEK IMMEDIATE MEDICAL CARE IF:  You have worsening shortness of breath.  You have trouble talking.  You have severe chest pain.  You have blood in your sputum.  You have a fever.  You have weakness, vomit repeatedly, or faint.  You feel confused.  You continue to get worse. MAKE SURE YOU:  Understand these instructions.  Will watch your condition.  Will get help right away if you are not doing well or get worse.   This information is not intended to replace advice given to you by your health care provider. Make sure you discuss any questions you have with your  health care provider.   Document Released: 10/01/2007 Document Revised: 12/25/2014 Document Reviewed: 08/08/2013 Elsevier  Interactive Patient Education [employer].

## 2016-05-12 NOTE — ED Notes (Signed)
Patient d/c'd self care.  F/U and medications discussed.  Patient verbalized understanding. 

## 2016-05-12 NOTE — Patient Instructions (Signed)
     IF you received an x-ray today, you will receive an invoice from [facility]. Please contact [facility] at [phone] with questions or concerns regarding your invoice.   IF you received labwork today, you will receive an invoice from [facility]. Please contact [facility] at [phone] with questions or concerns regarding your invoice.   Our billing staff will not be able to assist you with questions regarding bills from these companies.  You will be contacted with the lab results as soon as they are available. The fastest way to get your results is to activate your My Chart account. Instructions are located on the last page of this paperwork. If you have not heard from us regarding the results in 2 weeks, please contact this office.      

## 2016-05-12 NOTE — ED Provider Notes (Signed)
CSN: [id number]     Arrival date & time 05/12/16  1841 History   First MD Initiated Contact with Patient 05/12/16 1938     Chief Complaint  Patient presents with  . Shortness of Breath     (Consider location/radiation/quality/duration/timing/severity/associated sxs/prior Treatment) HPI Comments: Patient is a 70 year old male with history of COPD who presents with shortness of breath and cough for the past few days. Patient states he developed a productive cough but is ago with clear sputum. He has had increasing shortness of breath over the past 24 hours. Patient states he was around some dust, which usually causes his exacerbations. Patient states this is happened 3 or 4 times in the past 10-12 years. Patient has used his albuterol inhaler, as well as his new Bevespi Aerosphere, at home without relief. Patient states he usually only uses his albuterol inhaler once per month. Patient is not on any home oxygen. He reports that he normally has oxygen saturation from 93-94%. Patient was seen prior at an urgent care and given 22 nebs, 2 albuterol treatments and 125 Solu-Medrol IV prior to arrival. Patient states he was breathing at his baseline on my H&P. Patient states he is ready to go home. Patient denies any chest pain, abdominal pain, nausea, vomiting, dysuria.  Patient is a 70 y.o. male presenting with shortness of breath. The history is provided by the patient.  Shortness of Breath Associated symptoms: cough   Associated symptoms: no abdominal pain, no chest pain, no fever, no headaches, no rash, no sore throat and no vomiting     Past Medical History  Diagnosis Date  . Penile neoplasm 2014  . COPD (chronic obstructive pulmonary disease) with emphysema (HCC)     PULMOLOGIST-- DR Tarri Fuller YOUNG  . Ascending aortic aneurysm (HCC)     STABLE PER CHEST CT 09-26-2012  (4.3CM x 4.3CM)  . Chronic low back pain   . Arthritis   . Knee pain, bilateral     INTERMITTENT--  MENISCUS  . 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 Aortic calcification ([place]) 11/03/2013  . Right knee DJD    Past Surgical History  Procedure Laterality Date  . Tonsillectomy  AS CHILD  . Shoulder arthroscopy with rotator cuff repair and subacromial decompression Left 2004  . Reattachment left index finger  1988  . Penile biopsy N/A 03/14/2013    Procedure: PENILE BIOPSY;  Surgeon: Fredricka Bonine, MD;  Location: [facility];  Service: Urology;  Laterality: N/A;   Family History  Problem Relation Age of Onset  . Emphysema Mother   . Cancer Father     bile duct   Social History  Substance Use Topics  . Smoking status: Former Smoker -- 1.00 packs/day for 12 years    Types: Cigarettes    Quit date: 12/18/2009  . Smokeless tobacco: Never Used     Comment: HAD SMOKED OFF AND ON OVER 25 YR SPAN--  TOTAL 12 YRS  . Alcohol Use: 1.8 oz/week    3 Glasses of wine per week     Comment: 3/4 of a bottle of wine a day    Review of Systems  Constitutional: Negative for fever and chills.  HENT: Negative for facial swelling and sore throat.   Respiratory: Positive for cough and shortness of breath.   Cardiovascular: Negative for chest pain.  Gastrointestinal: Negative for nausea, vomiting and abdominal pain.  Genitourinary: Negative for dysuria.  Musculoskeletal: Negative for back pain.  Skin: Negative for rash  and wound.  Neurological: Negative for headaches.  Psychiatric/Behavioral: The patient is not nervous/anxious.       Allergies  Ceclor and Sulfa antibiotics  Home Medications   Prior to Admission medications   Medication Sig Start Date End Date Taking? Authorizing Provider  albuterol (PROAIR HFA) 108 (90 Base) MCG/ACT inhaler INHALE 2 PUFFS INTO THE LUNGS EVERY 4 HOURS AS NEEDED FOR WHEEZING ORSHORTNESS OF BREATH Patient taking differently: Inhale 2 puffs into the lungs every 4 (four) hours as needed for wheezing or shortness of breath. INHALE 2 PUFFS INTO THE LUNGS  EVERY 4 HOURS AS NEEDED FOR WHEEZING ORSHORTNESS OF BREATH 01/25/16  Yes Deneise Lever, MD  diclofenac (VOLTAREN) 50 MG EC tablet Take 50 mg by mouth 3 (three) times daily.   Yes Historical Provider, MD  Glycopyrrolate-Formoterol (BEVESPI AEROSPHERE) 9-4.8 MCG/ACT AERO Inhale 2 puffs into the lungs 2 (two) times daily. 04/27/16  Yes Deneise Lever, MD  HYDROcodone-acetaminophen (NORCO) 10-325 MG tablet Take 1 tablet by mouth every 6 (six) hours as needed for moderate pain or severe pain.    Yes Historical Provider, MD  DULERA 200-5 MCG/ACT AERO INHALE 2 PUFFS INTO THE LUNGS TWICE DAILY Patient not taking: Reported on 05/12/2016 03/17/14   Deneise Lever, MD  predniSONE (DELTASONE) 10 MG tablet Take 6 pills (60 mg) daily for 2 days, 5 pills (50mg ) daily for 2 days, 4 pills (40mg ) daily for 2 days, 3 pills (30mg ) daily for 2 days, 2 pills (20mg ) daily for 2 days, and one pill (10mg ) daily for 2 days. 05/12/16   Roby Donaway M Gaylan Fauver, PA-C   BP 151/87 mmHg  Pulse 83  Temp(Src) 97.7 F (36.5 C) (Oral)  Resp 15  SpO2 98% Physical Exam  Constitutional: He appears well-developed and well-nourished. No distress.  HENT:  Head: Normocephalic and atraumatic.  Mouth/Throat: Oropharynx is clear and moist. No oropharyngeal exudate.  Eyes: Conjunctivae are normal. Pupils are equal, round, and reactive to light. Right eye exhibits no discharge. Left eye exhibits no discharge. No scleral icterus.  Neck: Normal range of motion. Neck supple. No thyromegaly present.  Cardiovascular: Normal rate, regular rhythm, normal heart sounds and intact distal pulses.  Exam reveals no gallop and no friction rub.   No murmur heard. Pulmonary/Chest: Effort normal. No stridor. No respiratory distress. He has wheezes (diffuse expiratory). He has no rales.  Abdominal: Soft. Bowel sounds are normal. He exhibits no distension. There is no tenderness. There is no rebound and no guarding.  Musculoskeletal: He exhibits no edema.   Lymphadenopathy:    He has no cervical adenopathy.  Neurological: He is alert. Coordination normal.  Skin: Skin is warm and dry. No rash noted. He is not diaphoretic. No pallor.  Psychiatric: He has a normal mood and affect.  Nursing note and vitals reviewed.   ED Course  Procedures (including critical care time) Labs Review Labs Reviewed - No data to display  Imaging Review Dg Chest 2 View  05/12/2016  CLINICAL DATA:  SOB since last night. Chest congestion-coughing to get up phlegm x 3 days COPD, h/o ascending aortic aneurysm, former smoker x about 5 years ago No sx EXAM: CHEST  2 VIEW COMPARISON:  CT 10/01/2013 FINDINGS: Minimal pectus excavatum deformity. Moderate thoracic spondylosis. Numerous leads and wires project over the chest. Midline trachea. Normal heart size and mediastinal contours. No pleural effusion or pneumothorax. Diffuse peribronchial thickening. No lobar consolidation. Possible mild scarring at the left lung base medially. IMPRESSION: No acute cardiopulmonary disease.  Peribronchial thickening which may relate to chronic bronchitis or smoking. Electronically Signed   By: Abigail Miyamoto M.D.   On: 05/12/2016 20:43   I have personally reviewed and evaluated these images and lab results as part of my medical decision-making.   EKG Interpretation   Date/Time:  Friday May 12 2016 18:54:01 EDT Ventricular Rate:  84 PR Interval:  180 QRS Duration: 98 QT Interval:  383 QTC Calculation: 453 R Axis:   -8 Text Interpretation:  Sinus rhythm Anteroseptal infarct, old No old  tracing to compare Confirmed by KNAPP  MD-J, JON [id number]) on 05/12/2016  8:09:40 PM      MDM   Patient ambulated in ED with O2 saturations maintained >94, no current signs of respiratory distress. Patient given 1 more treatment while in ED. Lung exam improved following nebulizer treatment. CXR shows no acute cardiopulmonary disease, peribronchial thickening which may relate to chronic bronchitis or  smoking. Pt states they are breathing at baseline at rest and with ambulation. Patient discharged with 12 day prednisone taper. Pt has been instructed to continue using prescribed medications and to speak with PCP or pulmonologist about today's exacerbation. Patient also evaluated by Dr. Tomi Bamberger who is in agreement with plan. Patient vitals stable throughout ED course and discharged in satisfactory condition.  Final diagnoses:  COPD with acute exacerbation [facility])  Shortness of breath        Frederica Kuster, PA-C 05/12/16 2216  Dorie Rank, MD 05/13/16 1332

## 2016-05-12 NOTE — Progress Notes (Signed)
05/12/2016 6:20 PM   DOB: Sep 12, 1946 / MRN: [id number]  SUBJECTIVE:  Kent Flowers is a 70 y.o. male presenting for cough and wheezing.  This started about three days ago and is worsening mostly today.  He has a history of COPD and has a 25 pack year history of smoking.   He gets roughly 3-4 flares yearly however this has been a particularly difficult episode.  He has a 25 pack year history of smoking.   He is allergic to ceclor and sulfa antibiotics.   He  has a past medical history of Penile neoplasm (2014); COPD (chronic obstructive pulmonary disease) with emphysema ([place]); Ascending aortic aneurysm ([place]); Chronic low back pain; Arthritis; Knee pain, bilateral; Ectatic thoracic aorta ([place]) (11/03/2013); Aortic calcification ([facility]) (11/03/2013); and Right knee DJD.    He  reports that he quit smoking about 6 years ago. His smoking use included Cigarettes. He has a 12 pack-year smoking history. He has never used smokeless tobacco. He reports that he drinks about 1.8 oz of alcohol per week. He reports that he does not use illicit drugs. He  has no sexual activity history on file. The patient  has past surgical history that includes Tonsillectomy (AS CHILD); Shoulder arthroscopy with rotator cuff repair and subacromial decompression (Left, 2004); REATTACHMENT LEFT INDEX FINGER (1988); and Penile biopsy (N/A, 03/14/2013).  His family history includes Cancer in his father; Emphysema in his mother.  Review of Systems  Constitutional: Negative for fever and chills.  Respiratory: Positive for cough, shortness of breath and wheezing. Negative for hemoptysis and sputum production.   Cardiovascular: Negative for chest pain and leg swelling.  Gastrointestinal: Negative for vomiting.  Skin: Negative for rash.  Neurological: Negative for dizziness and headaches.    Problem list and medications reviewed and updated by myself where necessary, and exist elsewhere in the encounter.   OBJECTIVE:  BP  147/88 mmHg  Pulse 79  Temp(Src) 97.8 F (36.6 C)  Resp 18  Ht 5\' 6"  (1.676 m)  Wt 180 lb (81.647 kg)  BMI 29.07 kg/m2  SpO2 92%  PF 140 L/min  Physical Exam  Constitutional: He is oriented to person, place, and time. He appears ill. He appears distressed.  Cardiovascular: Normal rate and regular rhythm.   Pulmonary/Chest: He is in respiratory distress. He has wheezes (inspiratory and expiratory in all lung fields). He has no rales. He exhibits no tenderness.  Abdominal: Soft. Bowel sounds are normal.  Musculoskeletal: Normal range of motion.  Neurological: He is alert and oriented to person, place, and time.  Skin: Skin is warm and dry.  Nursing note and vitals reviewed.   No results found for this or any previous visit (from the past 72 hour(s)).  No results found.  ASSESSMENT AND PLAN  Braylan was seen today for wheezing and shortness of breath.  Diagnoses and all orders for this visit:  Wheezing: He has not improved with our treatments.  His peak flow is 140 and his wheezing has not improved.  Will get him to the ED for further evaluation and management.  -     albuterol (PROVENTIL) (2.5 MG/3ML) 0.083% nebulizer solution 2.5 mg; Take 3 mLs (2.5 mg total) by nebulization once. -     ipratropium (ATROVENT) nebulizer solution 0.5 mg; Take 2.5 mLs (0.5 mg total) by nebulization once. -     albuterol (PROVENTIL) (2.5 MG/3ML) 0.083% nebulizer solution 2.5 mg; Take 3 mLs (2.5 mg total) by nebulization once. -  methylPREDNISolone sodium succinate (SOLU-MEDROL) 125 mg/2 mL injection 125 mg; Inject 2 mLs (125 mg total) into the vein once.  Tachypnea  DOE (dyspnea on exertion)  Respiratory retractions    The patient was advised to call or return to clinic if he does not see an improvement in symptoms or to seek the care of the closest emergency department if he worsens with the above plan.   Philis Fendt, MHS, PA-C [facility] 05/12/2016 6:20 PM

## 2016-05-12 NOTE — ED Notes (Signed)
Pt presents to [facility] via [facility] from [facility] for worsening shortness of breath that started this morning. He received 125 solu-medrol, 2 duo nebs, 2 albuterol treatments, and continuing to feel tight with wheezes. He has a hx of COPD but does not wear home oxygen. Urgent care paperwork mentions hx of a lung nodule and hepatitis.

## 2016-05-12 NOTE — ED Notes (Signed)
Respiratory at bedside.

## 2016-05-25 ENCOUNTER — Telehealth: Payer: Self-pay | Admitting: Family Medicine

## 2016-05-25 NOTE — Telephone Encounter (Signed)
LVM per [facility] to call back and schedule follow up or cpe with Dr. Lorelei Pont

## 2016-06-12 DIAGNOSIS — M25562 Pain in left knee: Secondary | ICD-10-CM | POA: Diagnosis not present

## 2016-06-15 DIAGNOSIS — H5203 Hypermetropia, bilateral: Secondary | ICD-10-CM | POA: Diagnosis not present

## 2016-06-19 DIAGNOSIS — M5432 Sciatica, left side: Secondary | ICD-10-CM | POA: Diagnosis not present

## 2016-06-19 DIAGNOSIS — M9901 Segmental and somatic dysfunction of cervical region: Secondary | ICD-10-CM | POA: Diagnosis not present

## 2016-06-19 DIAGNOSIS — M5136 Other intervertebral disc degeneration, lumbar region: Secondary | ICD-10-CM | POA: Diagnosis not present

## 2016-06-19 DIAGNOSIS — M9903 Segmental and somatic dysfunction of lumbar region: Secondary | ICD-10-CM | POA: Diagnosis not present

## 2016-06-21 DIAGNOSIS — M9901 Segmental and somatic dysfunction of cervical region: Secondary | ICD-10-CM | POA: Diagnosis not present

## 2016-06-21 DIAGNOSIS — M5432 Sciatica, left side: Secondary | ICD-10-CM | POA: Diagnosis not present

## 2016-06-21 DIAGNOSIS — M5136 Other intervertebral disc degeneration, lumbar region: Secondary | ICD-10-CM | POA: Diagnosis not present

## 2016-06-21 DIAGNOSIS — M9903 Segmental and somatic dysfunction of lumbar region: Secondary | ICD-10-CM | POA: Diagnosis not present

## 2016-06-26 DIAGNOSIS — M5432 Sciatica, left side: Secondary | ICD-10-CM | POA: Diagnosis not present

## 2016-06-26 DIAGNOSIS — M9903 Segmental and somatic dysfunction of lumbar region: Secondary | ICD-10-CM | POA: Diagnosis not present

## 2016-06-26 DIAGNOSIS — M5136 Other intervertebral disc degeneration, lumbar region: Secondary | ICD-10-CM | POA: Diagnosis not present

## 2016-06-26 DIAGNOSIS — M9901 Segmental and somatic dysfunction of cervical region: Secondary | ICD-10-CM | POA: Diagnosis not present

## 2016-06-29 DIAGNOSIS — M5432 Sciatica, left side: Secondary | ICD-10-CM | POA: Diagnosis not present

## 2016-06-29 DIAGNOSIS — M5136 Other intervertebral disc degeneration, lumbar region: Secondary | ICD-10-CM | POA: Diagnosis not present

## 2016-06-29 DIAGNOSIS — M9903 Segmental and somatic dysfunction of lumbar region: Secondary | ICD-10-CM | POA: Diagnosis not present

## 2016-06-29 DIAGNOSIS — M9901 Segmental and somatic dysfunction of cervical region: Secondary | ICD-10-CM | POA: Diagnosis not present

## 2016-06-30 ENCOUNTER — Telehealth: Payer: Self-pay | Admitting: Internal Medicine

## 2016-06-30 MED ORDER — ALBUTEROL SULFATE HFA 108 (90 BASE) MCG/ACT IN AERS: INHALATION_SPRAY | RESPIRATORY_TRACT | Status: DC

## 2016-06-30 NOTE — Telephone Encounter (Signed)
Proair was sent to [facility] with the pt and notified that this was done  Nothing further needed

## 2016-07-03 DIAGNOSIS — M9903 Segmental and somatic dysfunction of lumbar region: Secondary | ICD-10-CM | POA: Diagnosis not present

## 2016-07-03 DIAGNOSIS — M5432 Sciatica, left side: Secondary | ICD-10-CM | POA: Diagnosis not present

## 2016-07-03 DIAGNOSIS — M9901 Segmental and somatic dysfunction of cervical region: Secondary | ICD-10-CM | POA: Diagnosis not present

## 2016-07-03 DIAGNOSIS — M5136 Other intervertebral disc degeneration, lumbar region: Secondary | ICD-10-CM | POA: Diagnosis not present

## 2016-07-05 DIAGNOSIS — M5432 Sciatica, left side: Secondary | ICD-10-CM | POA: Diagnosis not present

## 2016-07-05 DIAGNOSIS — M5136 Other intervertebral disc degeneration, lumbar region: Secondary | ICD-10-CM | POA: Diagnosis not present

## 2016-07-05 DIAGNOSIS — M9901 Segmental and somatic dysfunction of cervical region: Secondary | ICD-10-CM | POA: Diagnosis not present

## 2016-07-05 DIAGNOSIS — M9903 Segmental and somatic dysfunction of lumbar region: Secondary | ICD-10-CM | POA: Diagnosis not present

## 2016-07-10 DIAGNOSIS — M9901 Segmental and somatic dysfunction of cervical region: Secondary | ICD-10-CM | POA: Diagnosis not present

## 2016-07-10 DIAGNOSIS — M9903 Segmental and somatic dysfunction of lumbar region: Secondary | ICD-10-CM | POA: Diagnosis not present

## 2016-07-10 DIAGNOSIS — M5136 Other intervertebral disc degeneration, lumbar region: Secondary | ICD-10-CM | POA: Diagnosis not present

## 2016-07-10 DIAGNOSIS — M5432 Sciatica, left side: Secondary | ICD-10-CM | POA: Diagnosis not present

## 2016-07-12 DIAGNOSIS — M9901 Segmental and somatic dysfunction of cervical region: Secondary | ICD-10-CM | POA: Diagnosis not present

## 2016-07-12 DIAGNOSIS — M5136 Other intervertebral disc degeneration, lumbar region: Secondary | ICD-10-CM | POA: Diagnosis not present

## 2016-07-12 DIAGNOSIS — M9903 Segmental and somatic dysfunction of lumbar region: Secondary | ICD-10-CM | POA: Diagnosis not present

## 2016-07-12 DIAGNOSIS — M5432 Sciatica, left side: Secondary | ICD-10-CM | POA: Diagnosis not present

## 2016-07-18 ENCOUNTER — Ambulatory Visit (INDEPENDENT_AMBULATORY_CARE_PROVIDER_SITE_OTHER): Payer: Medicare Other | Admitting: Family Medicine

## 2016-07-18 ENCOUNTER — Encounter: Payer: Self-pay | Admitting: Family Medicine

## 2016-07-18 DIAGNOSIS — Z659 Problem related to unspecified psychosocial circumstances: Secondary | ICD-10-CM

## 2016-07-18 DIAGNOSIS — J439 Emphysema, unspecified: Secondary | ICD-10-CM

## 2016-07-18 MED ORDER — ALPRAZOLAM 0.5 MG PO TABS: ORAL_TABLET | ORAL | 0 refills | Status: DC

## 2016-07-18 MED ORDER — GLYCOPYRROLATE-FORMOTEROL 9-4.8 MCG/ACT IN AERO: 2.0000 | INHALATION_SPRAY | Freq: Two times a day (BID) | RESPIRATORY_TRACT | Status: DC

## 2016-07-18 NOTE — Progress Notes (Signed)
His daughter had been dx'd as bipolar and this has been a strain for him.  She has had mult inpatient admissions.    He has been more irritable in the meantime, with all of this going on with his daughter and his work situation.  Had used xanax episodically for anxiety in the past w/o ADE.  No recent flares.  No SI/HI.  D/w pt about options, re: abortive tx vs proph tx.    He is off all inhalers except for SABA prn.  Using SABA in the AM usually, but not later in the day.  He usually gets phelgm up in the AM after SABA dose and then feels better.  He uses SABA once per day, most days but not everyday.  ER eval in 04/2016, had seen pulmonary in the meantime re: ER f/u.    Chronic knee and sciatica/back pain, has seen Dr. Lynann Bologna.  Seeing chiropractor.  On vicodin per Dr. Lynann Bologna.    Meds, vitals, and allergies reviewed.   ROS: Per HPI unless specifically indicated in ROS section   GEN: nad, alert and oriented HEENT: mucous membranes moist NECK: supple w/o LA CV: rrr.  PULM: ctab, no inc wob ABD: soft, +bs EXT: no edema Speech and judgement wnl.

## 2016-07-18 NOTE — Patient Instructions (Signed)
Try the daily inhaler and see if you need the albuterol less.  Update pulmonary as needed.  I'll notify [place] in the meantime.  If frequent need for xanax, then follow up with Dr. Lorelei Pont.  Take care.  Glad to see you.

## 2016-07-18 NOTE — Progress Notes (Signed)
Pre visit review using our clinic review tool, if applicable. No additional management support is needed unless otherwise documented below in the visit note. 

## 2016-07-19 DIAGNOSIS — Z659 Problem related to unspecified psychosocial circumstances: Secondary | ICD-10-CM | POA: Insufficient documentation

## 2016-07-19 NOTE — Assessment & Plan Note (Signed)
Okay for outpatient f/u.  No SI/HI.  D/w pt about maintenance vs abortive tx.  Can use short term xanax for now, may need SSRI vs similar in the future, d/w pt.  Will defer to PCP and patient.   He agrees. Routine cautions on meds given.  He agrees.   >25 minutes spent in face to face time with patient, >50% spent in counselling or coordination of care.

## 2016-07-19 NOTE — Assessment & Plan Note (Signed)
D/w pt about maintenance vs abortive tx.  He'll restart controller inhaler, with SABA prn and f/u prn.

## 2016-07-20 DIAGNOSIS — M5432 Sciatica, left side: Secondary | ICD-10-CM | POA: Diagnosis not present

## 2016-07-20 DIAGNOSIS — M9903 Segmental and somatic dysfunction of lumbar region: Secondary | ICD-10-CM | POA: Diagnosis not present

## 2016-07-20 DIAGNOSIS — M5136 Other intervertebral disc degeneration, lumbar region: Secondary | ICD-10-CM | POA: Diagnosis not present

## 2016-07-20 DIAGNOSIS — M9901 Segmental and somatic dysfunction of cervical region: Secondary | ICD-10-CM | POA: Diagnosis not present

## 2016-07-24 DIAGNOSIS — M9901 Segmental and somatic dysfunction of cervical region: Secondary | ICD-10-CM | POA: Diagnosis not present

## 2016-07-24 DIAGNOSIS — M5432 Sciatica, left side: Secondary | ICD-10-CM | POA: Diagnosis not present

## 2016-07-24 DIAGNOSIS — M9903 Segmental and somatic dysfunction of lumbar region: Secondary | ICD-10-CM | POA: Diagnosis not present

## 2016-07-24 DIAGNOSIS — M5136 Other intervertebral disc degeneration, lumbar region: Secondary | ICD-10-CM | POA: Diagnosis not present

## 2016-07-27 DIAGNOSIS — M5432 Sciatica, left side: Secondary | ICD-10-CM | POA: Diagnosis not present

## 2016-07-27 DIAGNOSIS — M9901 Segmental and somatic dysfunction of cervical region: Secondary | ICD-10-CM | POA: Diagnosis not present

## 2016-07-27 DIAGNOSIS — M5136 Other intervertebral disc degeneration, lumbar region: Secondary | ICD-10-CM | POA: Diagnosis not present

## 2016-07-27 DIAGNOSIS — M9903 Segmental and somatic dysfunction of lumbar region: Secondary | ICD-10-CM | POA: Diagnosis not present

## 2016-08-02 DIAGNOSIS — M9903 Segmental and somatic dysfunction of lumbar region: Secondary | ICD-10-CM | POA: Diagnosis not present

## 2016-08-02 DIAGNOSIS — M5432 Sciatica, left side: Secondary | ICD-10-CM | POA: Diagnosis not present

## 2016-08-02 DIAGNOSIS — M9901 Segmental and somatic dysfunction of cervical region: Secondary | ICD-10-CM | POA: Diagnosis not present

## 2016-08-02 DIAGNOSIS — M5136 Other intervertebral disc degeneration, lumbar region: Secondary | ICD-10-CM | POA: Diagnosis not present

## 2016-08-09 DIAGNOSIS — M9901 Segmental and somatic dysfunction of cervical region: Secondary | ICD-10-CM | POA: Diagnosis not present

## 2016-08-09 DIAGNOSIS — M5432 Sciatica, left side: Secondary | ICD-10-CM | POA: Diagnosis not present

## 2016-08-09 DIAGNOSIS — M5136 Other intervertebral disc degeneration, lumbar region: Secondary | ICD-10-CM | POA: Diagnosis not present

## 2016-08-09 DIAGNOSIS — M9903 Segmental and somatic dysfunction of lumbar region: Secondary | ICD-10-CM | POA: Diagnosis not present

## 2016-08-10 DIAGNOSIS — M5432 Sciatica, left side: Secondary | ICD-10-CM | POA: Diagnosis not present

## 2016-08-10 DIAGNOSIS — M9901 Segmental and somatic dysfunction of cervical region: Secondary | ICD-10-CM | POA: Diagnosis not present

## 2016-08-10 DIAGNOSIS — M5136 Other intervertebral disc degeneration, lumbar region: Secondary | ICD-10-CM | POA: Diagnosis not present

## 2016-08-10 DIAGNOSIS — M9903 Segmental and somatic dysfunction of lumbar region: Secondary | ICD-10-CM | POA: Diagnosis not present

## 2016-08-30 DIAGNOSIS — M1712 Unilateral primary osteoarthritis, left knee: Secondary | ICD-10-CM | POA: Diagnosis not present

## 2016-08-30 DIAGNOSIS — M47816 Spondylosis without myelopathy or radiculopathy, lumbar region: Secondary | ICD-10-CM | POA: Diagnosis not present

## 2016-09-04 DIAGNOSIS — M5432 Sciatica, left side: Secondary | ICD-10-CM | POA: Diagnosis not present

## 2016-09-04 DIAGNOSIS — M9901 Segmental and somatic dysfunction of cervical region: Secondary | ICD-10-CM | POA: Diagnosis not present

## 2016-09-04 DIAGNOSIS — M5136 Other intervertebral disc degeneration, lumbar region: Secondary | ICD-10-CM | POA: Diagnosis not present

## 2016-09-04 DIAGNOSIS — M9903 Segmental and somatic dysfunction of lumbar region: Secondary | ICD-10-CM | POA: Diagnosis not present

## 2016-09-07 DIAGNOSIS — M9901 Segmental and somatic dysfunction of cervical region: Secondary | ICD-10-CM | POA: Diagnosis not present

## 2016-09-07 DIAGNOSIS — M5136 Other intervertebral disc degeneration, lumbar region: Secondary | ICD-10-CM | POA: Diagnosis not present

## 2016-09-07 DIAGNOSIS — M9903 Segmental and somatic dysfunction of lumbar region: Secondary | ICD-10-CM | POA: Diagnosis not present

## 2016-09-07 DIAGNOSIS — M5432 Sciatica, left side: Secondary | ICD-10-CM | POA: Diagnosis not present

## 2016-09-11 DIAGNOSIS — M9903 Segmental and somatic dysfunction of lumbar region: Secondary | ICD-10-CM | POA: Diagnosis not present

## 2016-09-11 DIAGNOSIS — M9901 Segmental and somatic dysfunction of cervical region: Secondary | ICD-10-CM | POA: Diagnosis not present

## 2016-09-11 DIAGNOSIS — M5136 Other intervertebral disc degeneration, lumbar region: Secondary | ICD-10-CM | POA: Diagnosis not present

## 2016-09-11 DIAGNOSIS — M5432 Sciatica, left side: Secondary | ICD-10-CM | POA: Diagnosis not present

## 2016-09-14 DIAGNOSIS — M9903 Segmental and somatic dysfunction of lumbar region: Secondary | ICD-10-CM | POA: Diagnosis not present

## 2016-09-14 DIAGNOSIS — M9901 Segmental and somatic dysfunction of cervical region: Secondary | ICD-10-CM | POA: Diagnosis not present

## 2016-09-14 DIAGNOSIS — M5136 Other intervertebral disc degeneration, lumbar region: Secondary | ICD-10-CM | POA: Diagnosis not present

## 2016-09-14 DIAGNOSIS — M5432 Sciatica, left side: Secondary | ICD-10-CM | POA: Diagnosis not present

## 2016-09-18 DIAGNOSIS — M1712 Unilateral primary osteoarthritis, left knee: Secondary | ICD-10-CM | POA: Diagnosis not present

## 2016-09-20 DIAGNOSIS — M9903 Segmental and somatic dysfunction of lumbar region: Secondary | ICD-10-CM | POA: Diagnosis not present

## 2016-09-20 DIAGNOSIS — M5136 Other intervertebral disc degeneration, lumbar region: Secondary | ICD-10-CM | POA: Diagnosis not present

## 2016-09-20 DIAGNOSIS — M5432 Sciatica, left side: Secondary | ICD-10-CM | POA: Diagnosis not present

## 2016-09-20 DIAGNOSIS — M9901 Segmental and somatic dysfunction of cervical region: Secondary | ICD-10-CM | POA: Diagnosis not present

## 2016-09-21 DIAGNOSIS — M5432 Sciatica, left side: Secondary | ICD-10-CM | POA: Diagnosis not present

## 2016-09-21 DIAGNOSIS — M5136 Other intervertebral disc degeneration, lumbar region: Secondary | ICD-10-CM | POA: Diagnosis not present

## 2016-09-21 DIAGNOSIS — M9903 Segmental and somatic dysfunction of lumbar region: Secondary | ICD-10-CM | POA: Diagnosis not present

## 2016-09-21 DIAGNOSIS — M9901 Segmental and somatic dysfunction of cervical region: Secondary | ICD-10-CM | POA: Diagnosis not present

## 2016-09-26 DIAGNOSIS — M9901 Segmental and somatic dysfunction of cervical region: Secondary | ICD-10-CM | POA: Diagnosis not present

## 2016-09-26 DIAGNOSIS — M5136 Other intervertebral disc degeneration, lumbar region: Secondary | ICD-10-CM | POA: Diagnosis not present

## 2016-09-26 DIAGNOSIS — M5432 Sciatica, left side: Secondary | ICD-10-CM | POA: Diagnosis not present

## 2016-09-26 DIAGNOSIS — M9903 Segmental and somatic dysfunction of lumbar region: Secondary | ICD-10-CM | POA: Diagnosis not present

## 2016-09-28 DIAGNOSIS — M5136 Other intervertebral disc degeneration, lumbar region: Secondary | ICD-10-CM | POA: Diagnosis not present

## 2016-09-28 DIAGNOSIS — M9903 Segmental and somatic dysfunction of lumbar region: Secondary | ICD-10-CM | POA: Diagnosis not present

## 2016-09-28 DIAGNOSIS — M5432 Sciatica, left side: Secondary | ICD-10-CM | POA: Diagnosis not present

## 2016-09-28 DIAGNOSIS — M9901 Segmental and somatic dysfunction of cervical region: Secondary | ICD-10-CM | POA: Diagnosis not present

## 2016-10-02 DIAGNOSIS — M5432 Sciatica, left side: Secondary | ICD-10-CM | POA: Diagnosis not present

## 2016-10-02 DIAGNOSIS — M9901 Segmental and somatic dysfunction of cervical region: Secondary | ICD-10-CM | POA: Diagnosis not present

## 2016-10-02 DIAGNOSIS — M9903 Segmental and somatic dysfunction of lumbar region: Secondary | ICD-10-CM | POA: Diagnosis not present

## 2016-10-02 DIAGNOSIS — M5136 Other intervertebral disc degeneration, lumbar region: Secondary | ICD-10-CM | POA: Diagnosis not present

## 2016-10-05 DIAGNOSIS — M5136 Other intervertebral disc degeneration, lumbar region: Secondary | ICD-10-CM | POA: Diagnosis not present

## 2016-10-05 DIAGNOSIS — M9903 Segmental and somatic dysfunction of lumbar region: Secondary | ICD-10-CM | POA: Diagnosis not present

## 2016-10-05 DIAGNOSIS — M5432 Sciatica, left side: Secondary | ICD-10-CM | POA: Diagnosis not present

## 2016-10-05 DIAGNOSIS — M9901 Segmental and somatic dysfunction of cervical region: Secondary | ICD-10-CM | POA: Diagnosis not present

## 2016-10-09 DIAGNOSIS — M1712 Unilateral primary osteoarthritis, left knee: Secondary | ICD-10-CM | POA: Diagnosis not present

## 2016-10-12 DIAGNOSIS — M9901 Segmental and somatic dysfunction of cervical region: Secondary | ICD-10-CM | POA: Diagnosis not present

## 2016-10-12 DIAGNOSIS — M5432 Sciatica, left side: Secondary | ICD-10-CM | POA: Diagnosis not present

## 2016-10-12 DIAGNOSIS — M9903 Segmental and somatic dysfunction of lumbar region: Secondary | ICD-10-CM | POA: Diagnosis not present

## 2016-10-12 DIAGNOSIS — M5136 Other intervertebral disc degeneration, lumbar region: Secondary | ICD-10-CM | POA: Diagnosis not present

## 2016-10-16 DIAGNOSIS — M5136 Other intervertebral disc degeneration, lumbar region: Secondary | ICD-10-CM | POA: Diagnosis not present

## 2016-10-16 DIAGNOSIS — M9903 Segmental and somatic dysfunction of lumbar region: Secondary | ICD-10-CM | POA: Diagnosis not present

## 2016-10-16 DIAGNOSIS — M5432 Sciatica, left side: Secondary | ICD-10-CM | POA: Diagnosis not present

## 2016-10-16 DIAGNOSIS — M9901 Segmental and somatic dysfunction of cervical region: Secondary | ICD-10-CM | POA: Diagnosis not present

## 2016-10-18 ENCOUNTER — Other Ambulatory Visit: Payer: Self-pay | Admitting: Internal Medicine

## 2016-10-25 DIAGNOSIS — M9901 Segmental and somatic dysfunction of cervical region: Secondary | ICD-10-CM | POA: Diagnosis not present

## 2016-10-25 DIAGNOSIS — M5136 Other intervertebral disc degeneration, lumbar region: Secondary | ICD-10-CM | POA: Diagnosis not present

## 2016-10-25 DIAGNOSIS — M9903 Segmental and somatic dysfunction of lumbar region: Secondary | ICD-10-CM | POA: Diagnosis not present

## 2016-10-25 DIAGNOSIS — M5432 Sciatica, left side: Secondary | ICD-10-CM | POA: Diagnosis not present

## 2016-11-02 DIAGNOSIS — M9901 Segmental and somatic dysfunction of cervical region: Secondary | ICD-10-CM | POA: Diagnosis not present

## 2016-11-02 DIAGNOSIS — M5136 Other intervertebral disc degeneration, lumbar region: Secondary | ICD-10-CM | POA: Diagnosis not present

## 2016-11-02 DIAGNOSIS — M5432 Sciatica, left side: Secondary | ICD-10-CM | POA: Diagnosis not present

## 2016-11-02 DIAGNOSIS — M9903 Segmental and somatic dysfunction of lumbar region: Secondary | ICD-10-CM | POA: Diagnosis not present

## 2016-11-13 DIAGNOSIS — M9901 Segmental and somatic dysfunction of cervical region: Secondary | ICD-10-CM | POA: Diagnosis not present

## 2016-11-13 DIAGNOSIS — M5136 Other intervertebral disc degeneration, lumbar region: Secondary | ICD-10-CM | POA: Diagnosis not present

## 2016-11-13 DIAGNOSIS — M9903 Segmental and somatic dysfunction of lumbar region: Secondary | ICD-10-CM | POA: Diagnosis not present

## 2016-11-13 DIAGNOSIS — M5432 Sciatica, left side: Secondary | ICD-10-CM | POA: Diagnosis not present

## 2016-11-16 DIAGNOSIS — M9901 Segmental and somatic dysfunction of cervical region: Secondary | ICD-10-CM | POA: Diagnosis not present

## 2016-11-16 DIAGNOSIS — M5136 Other intervertebral disc degeneration, lumbar region: Secondary | ICD-10-CM | POA: Diagnosis not present

## 2016-11-16 DIAGNOSIS — M9903 Segmental and somatic dysfunction of lumbar region: Secondary | ICD-10-CM | POA: Diagnosis not present

## 2016-11-16 DIAGNOSIS — M5432 Sciatica, left side: Secondary | ICD-10-CM | POA: Diagnosis not present

## 2016-11-22 DIAGNOSIS — M9901 Segmental and somatic dysfunction of cervical region: Secondary | ICD-10-CM | POA: Diagnosis not present

## 2016-11-22 DIAGNOSIS — M5432 Sciatica, left side: Secondary | ICD-10-CM | POA: Diagnosis not present

## 2016-11-22 DIAGNOSIS — M5136 Other intervertebral disc degeneration, lumbar region: Secondary | ICD-10-CM | POA: Diagnosis not present

## 2016-11-22 DIAGNOSIS — M9903 Segmental and somatic dysfunction of lumbar region: Secondary | ICD-10-CM | POA: Diagnosis not present

## 2016-11-29 DIAGNOSIS — M9903 Segmental and somatic dysfunction of lumbar region: Secondary | ICD-10-CM | POA: Diagnosis not present

## 2016-11-29 DIAGNOSIS — M5432 Sciatica, left side: Secondary | ICD-10-CM | POA: Diagnosis not present

## 2016-11-29 DIAGNOSIS — M5136 Other intervertebral disc degeneration, lumbar region: Secondary | ICD-10-CM | POA: Diagnosis not present

## 2016-11-29 DIAGNOSIS — M9901 Segmental and somatic dysfunction of cervical region: Secondary | ICD-10-CM | POA: Diagnosis not present

## 2016-12-12 ENCOUNTER — Telehealth: Payer: Self-pay | Admitting: Internal Medicine

## 2016-12-12 MED ORDER — ALBUTEROL SULFATE HFA 108 (90 BASE) MCG/ACT IN AERS: INHALATION_SPRAY | RESPIRATORY_TRACT | 2 refills | Status: DC

## 2016-12-12 NOTE — Telephone Encounter (Signed)
Spoke with pt. He is needing a prescription for Proair to be sent to his pharmacy. Pt is also requesting a new prescription for Anoro to be sent. States that Charolotte Eke is going to cost him to much but his insurance company states that [employer] will not cost him much. He is aware that CY will be back in the office tomorrow.  CY - please advise on Anoro. Thanks.

## 2016-12-13 MED ORDER — UMECLIDINIUM-VILANTEROL 62.5-25 MCG/INH IN AEPB: 1.0000 | INHALATION_SPRAY | Freq: Every day | RESPIRATORY_TRACT | 11 refills | Status: AC

## 2016-12-13 NOTE — Telephone Encounter (Signed)
Ok to [employer], replace with Anoro    Inhale 1 puff, once daily    Refill x 1 year   Ok to refill his Proair x 1 year

## 2016-12-13 NOTE — Telephone Encounter (Signed)
Pt aware of CY recommendations and voiced understanding. Rx sent to preferred pharmacy. Pt states he  Does not currently need refills on proair. Nothing further needed.

## 2016-12-14 DIAGNOSIS — M9901 Segmental and somatic dysfunction of cervical region: Secondary | ICD-10-CM | POA: Diagnosis not present

## 2016-12-14 DIAGNOSIS — M5432 Sciatica, left side: Secondary | ICD-10-CM | POA: Diagnosis not present

## 2016-12-14 DIAGNOSIS — M5136 Other intervertebral disc degeneration, lumbar region: Secondary | ICD-10-CM | POA: Diagnosis not present

## 2016-12-14 DIAGNOSIS — M9903 Segmental and somatic dysfunction of lumbar region: Secondary | ICD-10-CM | POA: Diagnosis not present

## 2016-12-15 ENCOUNTER — Telehealth: Payer: Self-pay | Admitting: Family Medicine

## 2016-12-15 NOTE — Telephone Encounter (Signed)
Pt is wanting to transfer care from Dr Lorelei Pont to Dr Damita Dunnings due to availability reasons and feels comfortable with Dr Damita Dunnings.

## 2016-12-17 NOTE — Telephone Encounter (Signed)
This is fine with me. I have only seen him a couple of times since Dr. Jeannine Kitten retirement, and have less primary care availability than all other providers.

## 2016-12-17 NOTE — Telephone Encounter (Signed)
Okay with me.  Looks to be due for AMW visit- if so, then schedule with Lattie Haw and then 30 min f/u with me in the spring.  Thanks.

## 2016-12-19 ENCOUNTER — Encounter: Payer: Self-pay | Admitting: Family Medicine

## 2016-12-19 ENCOUNTER — Ambulatory Visit (INDEPENDENT_AMBULATORY_CARE_PROVIDER_SITE_OTHER): Payer: Medicare Other | Admitting: Family Medicine

## 2016-12-19 DIAGNOSIS — Z659 Problem related to unspecified psychosocial circumstances: Secondary | ICD-10-CM

## 2016-12-19 MED ORDER — HYDROXYZINE HCL 10 MG PO TABS
10.0000 mg | ORAL_TABLET | Freq: Two times a day (BID) | ORAL | 1 refills | Status: DC | PRN
Start: 2016-12-19 — End: 2016-12-26

## 2016-12-19 NOTE — Progress Notes (Signed)
Pre visit review using our clinic review tool, if applicable. No additional management support is needed unless otherwise documented below in the visit note. 

## 2016-12-19 NOTE — Progress Notes (Signed)
Anxiety.  His daughter was dx'd with BAD and this was a strain for the patient, though her situation is some better in the meantime.  He still has work stressors noted.  He still has episodes of anxiety.  D/w pt.  No SI/HI.  He clearly improves/responds with xanax with relief.  He doesn't take it daily.  He wants to take something other than xanax.  D/w pt.  No SI/HI.  2 glasses of wine nightly.    Pain meds for chronic joint pain per Dr. Lynann Bologna.    Meds, vitals, and allergies reviewed.   ROS: Per HPI unless specifically indicated in ROS section   GEN: nad, alert and oriented, speech and judgment normal. No suicidal or homicidal intent HEENT: mucous membranes moist NECK: supple w/o LA CV: rrr PULM: ctab, no inc wob ABD: soft, +bs EXT: no edema

## 2016-12-19 NOTE — Telephone Encounter (Signed)
I left msg asking pt to call our office to get scheduled for AWV.

## 2016-12-19 NOTE — Patient Instructions (Signed)
Use hydroxyzine as needed and update me as needed.  Take care.  Glad to see you.

## 2016-12-20 DIAGNOSIS — M170 Bilateral primary osteoarthritis of knee: Secondary | ICD-10-CM | POA: Diagnosis not present

## 2016-12-20 DIAGNOSIS — M9901 Segmental and somatic dysfunction of cervical region: Secondary | ICD-10-CM | POA: Diagnosis not present

## 2016-12-20 DIAGNOSIS — M9903 Segmental and somatic dysfunction of lumbar region: Secondary | ICD-10-CM | POA: Diagnosis not present

## 2016-12-20 DIAGNOSIS — M5432 Sciatica, left side: Secondary | ICD-10-CM | POA: Diagnosis not present

## 2016-12-20 DIAGNOSIS — M5136 Other intervertebral disc degeneration, lumbar region: Secondary | ICD-10-CM | POA: Diagnosis not present

## 2016-12-20 NOTE — Assessment & Plan Note (Signed)
Okay for outpatient follow-up. Discussed with patient about options. He is to stop taking Xanax. He does not take it chronically so he is not at risk for withdrawal. Is not needing daily medication, he does not want to daily medication. If it came to a daily medication, an SSRI may be appropriate. At this point he can use hydroxyzine as needed with sedation caution and update me as needed. He agrees.

## 2016-12-25 ENCOUNTER — Telehealth: Payer: Self-pay

## 2016-12-25 NOTE — Telephone Encounter (Signed)
Pt left v/m; pt was seen 12/19/16; pt was to stop xanax and start Hydroxyzine. The Hydroxyzine is not working at all and pt request xanax called to [facility] or will pt need to be rechecked. Pt request cb.

## 2016-12-26 MED ORDER — HYDROXYZINE HCL 10 MG PO TABS: 20.0000 mg | ORAL_TABLET | Freq: Two times a day (BID) | ORAL | Status: DC | PRN

## 2016-12-26 NOTE — Telephone Encounter (Signed)
Patient advised and states he has a fair amount of the Hydroxyzine 10 mg and will take 2 of those and see if that works and update [place] about it's effectiveness.

## 2016-12-26 NOTE — Telephone Encounter (Signed)
I would try hydroxyzine 20mg  before considering switching back to xanax.  Update me if not effective.  If no effect with that, then okay to restart xanax only on the condition that he is using It no more than twice a week.  If needed frequently, then he'll need to start other daily preventive med as discussed at [place].  I didn't send in rx yet.   Thanks.

## 2017-01-04 DIAGNOSIS — M9903 Segmental and somatic dysfunction of lumbar region: Secondary | ICD-10-CM | POA: Diagnosis not present

## 2017-01-04 DIAGNOSIS — M5432 Sciatica, left side: Secondary | ICD-10-CM | POA: Diagnosis not present

## 2017-01-04 DIAGNOSIS — M5136 Other intervertebral disc degeneration, lumbar region: Secondary | ICD-10-CM | POA: Diagnosis not present

## 2017-01-04 DIAGNOSIS — M9901 Segmental and somatic dysfunction of cervical region: Secondary | ICD-10-CM | POA: Diagnosis not present

## 2017-01-10 DIAGNOSIS — M9901 Segmental and somatic dysfunction of cervical region: Secondary | ICD-10-CM | POA: Diagnosis not present

## 2017-01-10 DIAGNOSIS — M5136 Other intervertebral disc degeneration, lumbar region: Secondary | ICD-10-CM | POA: Diagnosis not present

## 2017-01-10 DIAGNOSIS — M9903 Segmental and somatic dysfunction of lumbar region: Secondary | ICD-10-CM | POA: Diagnosis not present

## 2017-01-10 DIAGNOSIS — M5432 Sciatica, left side: Secondary | ICD-10-CM | POA: Diagnosis not present

## 2017-01-24 DIAGNOSIS — M9901 Segmental and somatic dysfunction of cervical region: Secondary | ICD-10-CM | POA: Diagnosis not present

## 2017-01-24 DIAGNOSIS — M5432 Sciatica, left side: Secondary | ICD-10-CM | POA: Diagnosis not present

## 2017-01-24 DIAGNOSIS — M5136 Other intervertebral disc degeneration, lumbar region: Secondary | ICD-10-CM | POA: Diagnosis not present

## 2017-01-24 DIAGNOSIS — M9903 Segmental and somatic dysfunction of lumbar region: Secondary | ICD-10-CM | POA: Diagnosis not present

## 2017-02-01 NOTE — Telephone Encounter (Signed)
Pt left v/m; pt started taking 2 of hydroxyzine as instructed and it has worked well. Pt request new rx of hydroxyzine 20 mg to [facility].

## 2017-02-02 MED ORDER — HYDROXYZINE HCL 10 MG PO TABS: 20.0000 mg | ORAL_TABLET | Freq: Two times a day (BID) | ORAL | 1 refills | Status: DC | PRN

## 2017-02-02 NOTE — Addendum Note (Signed)
Addended by: Tonia Ghent on: 02/02/2017 06:43 AM   Modules accepted: Orders

## 2017-02-02 NOTE — Telephone Encounter (Addendum)
Sent.  Thanks.  Glad this is working okay for patient.   It doesn't come in a 20 mg pill so I sent 180 of the 10 mg pills, to take 2 of the 10mg  tabs at a time.

## 2017-02-06 DIAGNOSIS — R310 Gross hematuria: Secondary | ICD-10-CM | POA: Diagnosis not present

## 2017-02-08 DIAGNOSIS — M5136 Other intervertebral disc degeneration, lumbar region: Secondary | ICD-10-CM | POA: Diagnosis not present

## 2017-02-08 DIAGNOSIS — M9901 Segmental and somatic dysfunction of cervical region: Secondary | ICD-10-CM | POA: Diagnosis not present

## 2017-02-08 DIAGNOSIS — M9903 Segmental and somatic dysfunction of lumbar region: Secondary | ICD-10-CM | POA: Diagnosis not present

## 2017-02-08 DIAGNOSIS — M5432 Sciatica, left side: Secondary | ICD-10-CM | POA: Diagnosis not present

## 2017-02-13 DIAGNOSIS — R310 Gross hematuria: Secondary | ICD-10-CM | POA: Diagnosis not present

## 2017-02-22 DIAGNOSIS — M9903 Segmental and somatic dysfunction of lumbar region: Secondary | ICD-10-CM | POA: Diagnosis not present

## 2017-02-22 DIAGNOSIS — M9901 Segmental and somatic dysfunction of cervical region: Secondary | ICD-10-CM | POA: Diagnosis not present

## 2017-02-22 DIAGNOSIS — M5136 Other intervertebral disc degeneration, lumbar region: Secondary | ICD-10-CM | POA: Diagnosis not present

## 2017-02-22 DIAGNOSIS — M5432 Sciatica, left side: Secondary | ICD-10-CM | POA: Diagnosis not present

## 2017-03-01 ENCOUNTER — Telehealth: Payer: Self-pay | Admitting: Family Medicine

## 2017-03-01 NOTE — Telephone Encounter (Signed)
Left pt message asking to call Allison back directly at [phone] to schedule AWV.+ labs with Lesia and CPE with PCP. °

## 2017-03-07 DIAGNOSIS — M9901 Segmental and somatic dysfunction of cervical region: Secondary | ICD-10-CM | POA: Diagnosis not present

## 2017-03-07 DIAGNOSIS — M5136 Other intervertebral disc degeneration, lumbar region: Secondary | ICD-10-CM | POA: Diagnosis not present

## 2017-03-07 DIAGNOSIS — M9903 Segmental and somatic dysfunction of lumbar region: Secondary | ICD-10-CM | POA: Diagnosis not present

## 2017-03-07 DIAGNOSIS — M5432 Sciatica, left side: Secondary | ICD-10-CM | POA: Diagnosis not present

## 2017-03-15 DIAGNOSIS — R310 Gross hematuria: Secondary | ICD-10-CM | POA: Diagnosis not present

## 2017-03-21 DIAGNOSIS — M5136 Other intervertebral disc degeneration, lumbar region: Secondary | ICD-10-CM | POA: Diagnosis not present

## 2017-03-21 DIAGNOSIS — M9903 Segmental and somatic dysfunction of lumbar region: Secondary | ICD-10-CM | POA: Diagnosis not present

## 2017-03-21 DIAGNOSIS — M9901 Segmental and somatic dysfunction of cervical region: Secondary | ICD-10-CM | POA: Diagnosis not present

## 2017-03-21 DIAGNOSIS — M5432 Sciatica, left side: Secondary | ICD-10-CM | POA: Diagnosis not present

## 2017-03-26 ENCOUNTER — Other Ambulatory Visit: Payer: Self-pay | Admitting: Urology

## 2017-03-27 ENCOUNTER — Other Ambulatory Visit: Payer: Self-pay | Admitting: Urology

## 2017-04-04 ENCOUNTER — Encounter (HOSPITAL_BASED_OUTPATIENT_CLINIC_OR_DEPARTMENT_OTHER): Payer: Self-pay | Admitting: *Deleted

## 2017-04-04 DIAGNOSIS — M5136 Other intervertebral disc degeneration, lumbar region: Secondary | ICD-10-CM | POA: Diagnosis not present

## 2017-04-04 DIAGNOSIS — M9901 Segmental and somatic dysfunction of cervical region: Secondary | ICD-10-CM | POA: Diagnosis not present

## 2017-04-04 DIAGNOSIS — M5432 Sciatica, left side: Secondary | ICD-10-CM | POA: Diagnosis not present

## 2017-04-04 DIAGNOSIS — M9903 Segmental and somatic dysfunction of lumbar region: Secondary | ICD-10-CM | POA: Diagnosis not present

## 2017-04-05 ENCOUNTER — Encounter (HOSPITAL_BASED_OUTPATIENT_CLINIC_OR_DEPARTMENT_OTHER): Payer: Self-pay | Admitting: *Deleted

## 2017-04-05 DIAGNOSIS — M170 Bilateral primary osteoarthritis of knee: Secondary | ICD-10-CM | POA: Diagnosis not present

## 2017-04-05 NOTE — Progress Notes (Addendum)
NPO AFTER MN.  ARRIVE AT 0945.  NEEDS HG.   ADDENDUM:  PT CALLED TODAY.  STATES HE IS UNABLE TO COME AND GET LAB WORK DONE TODAY (TESTOSTERONE, PSA).  PT WILL COME ARRIVE AT 0915 ,  WILL NEED HG, PSA, AND TESTOSTERONE DONE ON ARRIVAL).

## 2017-04-06 ENCOUNTER — Telehealth: Payer: Self-pay | Admitting: Internal Medicine

## 2017-04-06 MED ORDER — ALBUTEROL SULFATE HFA 108 (90 BASE) MCG/ACT IN AERS: INHALATION_SPRAY | RESPIRATORY_TRACT | 2 refills | Status: DC

## 2017-04-06 NOTE — Telephone Encounter (Signed)
Pt last seen 04/2016 Upcoming appt 04/2017 Proair sent to [facility] Nothing further needed.

## 2017-04-10 ENCOUNTER — Ambulatory Visit (HOSPITAL_BASED_OUTPATIENT_CLINIC_OR_DEPARTMENT_OTHER)
Admission: RE | Admit: 2017-04-10 | Discharge: 2017-04-10 | Disposition: A | Discharge status: Home / Self Care | Payer: Medicare Other | Source: Ambulatory Visit | Attending: Urology | Admitting: Urology

## 2017-04-10 ENCOUNTER — Encounter (HOSPITAL_BASED_OUTPATIENT_CLINIC_OR_DEPARTMENT_OTHER)
Admission: RE | Disposition: A | Discharge status: Home / Self Care | Payer: Self-pay | Source: Ambulatory Visit | Attending: Urology

## 2017-04-10 ENCOUNTER — Ambulatory Visit (HOSPITAL_BASED_OUTPATIENT_CLINIC_OR_DEPARTMENT_OTHER): Payer: Medicare Other | Admitting: Anesthesiology

## 2017-04-10 ENCOUNTER — Encounter (HOSPITAL_BASED_OUTPATIENT_CLINIC_OR_DEPARTMENT_OTHER): Payer: Self-pay | Admitting: *Deleted

## 2017-04-10 DIAGNOSIS — R310 Gross hematuria: Secondary | ICD-10-CM | POA: Insufficient documentation

## 2017-04-10 DIAGNOSIS — I2510 Atherosclerotic heart disease of native coronary artery without angina pectoris: Secondary | ICD-10-CM | POA: Insufficient documentation

## 2017-04-10 DIAGNOSIS — M545 Low back pain: Secondary | ICD-10-CM | POA: Diagnosis not present

## 2017-04-10 DIAGNOSIS — M1990 Unspecified osteoarthritis, unspecified site: Secondary | ICD-10-CM | POA: Diagnosis not present

## 2017-04-10 DIAGNOSIS — I739 Peripheral vascular disease, unspecified: Secondary | ICD-10-CM | POA: Insufficient documentation

## 2017-04-10 DIAGNOSIS — Z87891 Personal history of nicotine dependence: Secondary | ICD-10-CM | POA: Diagnosis not present

## 2017-04-10 DIAGNOSIS — E7800 Pure hypercholesterolemia, unspecified: Secondary | ICD-10-CM | POA: Insufficient documentation

## 2017-04-10 DIAGNOSIS — Z882 Allergy status to sulfonamides status: Secondary | ICD-10-CM | POA: Diagnosis not present

## 2017-04-10 DIAGNOSIS — J449 Chronic obstructive pulmonary disease, unspecified: Secondary | ICD-10-CM | POA: Insufficient documentation

## 2017-04-10 DIAGNOSIS — G8929 Other chronic pain: Secondary | ICD-10-CM | POA: Insufficient documentation

## 2017-04-10 DIAGNOSIS — N35112 Postinfective bulbous urethral stricture, not elsewhere classified: Secondary | ICD-10-CM | POA: Insufficient documentation

## 2017-04-10 DIAGNOSIS — I771 Stricture of artery: Secondary | ICD-10-CM | POA: Diagnosis not present

## 2017-04-10 DIAGNOSIS — N35912 Unspecified bulbous urethral stricture, male: Secondary | ICD-10-CM

## 2017-04-10 DIAGNOSIS — N359 Urethral stricture, unspecified: Secondary | ICD-10-CM | POA: Diagnosis not present

## 2017-04-10 HISTORY — DX: Other nonspecific abnormal finding of lung field: R91.8

## 2017-04-10 HISTORY — DX: Unspecified osteoarthritis, unspecified site: M19.90

## 2017-04-10 HISTORY — DX: Thoracic aortic aneurysm, without rupture: I71.2

## 2017-04-10 HISTORY — PX: CYSTOSCOPY WITH URETHRAL DILATATION: SHX5125

## 2017-04-10 HISTORY — DX: Aneurysm of the ascending aorta, without rupture: I71.21

## 2017-04-10 HISTORY — DX: Induration penis plastica: N48.6

## 2017-04-10 HISTORY — DX: Presence of dental prosthetic device (complete) (partial): K08.109

## 2017-04-10 HISTORY — PX: CYSTOSCOPY WITH BIOPSY: SHX5122

## 2017-04-10 HISTORY — DX: Personal history of in-situ neoplasm of skin: Z86.007

## 2017-04-10 HISTORY — DX: Urgency of urination: R39.15

## 2017-04-10 HISTORY — DX: Unspecified urethral stricture, male, unspecified site: N35.919

## 2017-04-10 HISTORY — DX: Benign prostatic hyperplasia with lower urinary tract symptoms: N40.1

## 2017-04-10 HISTORY — DX: Complete loss of teeth, unspecified cause, unspecified class: Z97.2

## 2017-04-10 HISTORY — DX: Presence of spectacles and contact lenses: Z97.3

## 2017-04-10 HISTORY — DX: Male erectile dysfunction, unspecified: N52.9

## 2017-04-10 HISTORY — DX: Gross hematuria: R31.0

## 2017-04-10 HISTORY — DX: Testicular hypofunction: E29.1

## 2017-04-10 LAB — POCT HEMOGLOBIN-HEMACUE: Hemoglobin: 15.4 g/dL (ref 13.0–17.0)

## 2017-04-10 LAB — PSA: PSA: 0.34 ng/mL (ref 0.00–4.00)

## 2017-04-10 SURGERY — CYSTOSCOPY, WITH URETHRAL DILATION
Anesthesia: General

## 2017-04-10 MED ORDER — STERILE WATER FOR IRRIGATION IR SOLN
Status: DC | PRN
  Administered 2017-04-10: 3000 mL

## 2017-04-10 MED ORDER — NITROFURANTOIN MACROCRYSTAL 100 MG PO CAPS: 100.0000 mg | ORAL_CAPSULE | Freq: Every day | ORAL | 0 refills | Status: DC

## 2017-04-10 MED ORDER — MIDAZOLAM HCL 2 MG/2ML IJ SOLN
INTRAMUSCULAR | Status: AC
  Filled 2017-04-10: qty 2

## 2017-04-10 MED ORDER — PHENYLEPHRINE 40 MCG/ML (10ML) SYRINGE FOR IV PUSH (FOR BLOOD PRESSURE SUPPORT)
PREFILLED_SYRINGE | INTRAVENOUS | Status: AC
  Filled 2017-04-10: qty 10

## 2017-04-10 MED ORDER — LACTATED RINGERS IV SOLN
INTRAVENOUS | Status: DC
  Administered 2017-04-10 (×2): via INTRAVENOUS
  Filled 2017-04-10: qty 1000

## 2017-04-10 MED ORDER — CIPROFLOXACIN IN D5W 400 MG/200ML IV SOLN
INTRAVENOUS | Status: AC
  Filled 2017-04-10: qty 200

## 2017-04-10 MED ORDER — PROPOFOL 500 MG/50ML IV EMUL
INTRAVENOUS | Status: DC | PRN
  Administered 2017-04-10: 50 mL via INTRAVENOUS
  Administered 2017-04-10: 150 mL via INTRAVENOUS

## 2017-04-10 MED ORDER — CEFAZOLIN SODIUM-DEXTROSE 2-4 GM/100ML-% IV SOLN
INTRAVENOUS | Status: AC
  Filled 2017-04-10: qty 100

## 2017-04-10 MED ORDER — LIDOCAINE HCL 2 % EX GEL
CUTANEOUS | Status: DC | PRN
  Administered 2017-04-10: 1

## 2017-04-10 MED ORDER — LIDOCAINE 2% (20 MG/ML) 5 ML SYRINGE
INTRAMUSCULAR | Status: AC
  Filled 2017-04-10: qty 10

## 2017-04-10 MED ORDER — OXYCODONE HCL 5 MG PO TABS
ORAL_TABLET | ORAL | Status: AC
  Filled 2017-04-10: qty 1

## 2017-04-10 MED ORDER — PROPOFOL 10 MG/ML IV BOLUS
INTRAVENOUS | Status: AC
  Filled 2017-04-10: qty 40

## 2017-04-10 MED ORDER — ONDANSETRON HCL 4 MG/2ML IJ SOLN
INTRAMUSCULAR | Status: AC
  Filled 2017-04-10: qty 2

## 2017-04-10 MED ORDER — IOHEXOL 300 MG/ML  SOLN
INTRAMUSCULAR | Status: DC | PRN
  Administered 2017-04-10: 30 mL via URETHRAL

## 2017-04-10 MED ORDER — LABETALOL HCL 5 MG/ML IV SOLN
INTRAVENOUS | Status: AC
  Filled 2017-04-10: qty 4

## 2017-04-10 MED ORDER — LIDOCAINE 2% (20 MG/ML) 5 ML SYRINGE
INTRAMUSCULAR | Status: DC | PRN
  Administered 2017-04-10: 60 mg via INTRAVENOUS

## 2017-04-10 MED ORDER — EPHEDRINE 5 MG/ML INJ
INTRAVENOUS | Status: AC
  Filled 2017-04-10: qty 10

## 2017-04-10 MED ORDER — FENTANYL CITRATE (PF) 100 MCG/2ML IJ SOLN
INTRAMUSCULAR | Status: AC
  Filled 2017-04-10: qty 2

## 2017-04-10 MED ORDER — EPHEDRINE SULFATE 50 MG/ML IJ SOLN
INTRAMUSCULAR | Status: DC | PRN
  Administered 2017-04-10: 10 mg via INTRAVENOUS

## 2017-04-10 MED ORDER — DEXAMETHASONE SODIUM PHOSPHATE 10 MG/ML IJ SOLN
INTRAMUSCULAR | Status: AC
  Filled 2017-04-10: qty 1

## 2017-04-10 MED ORDER — FENTANYL CITRATE (PF) 100 MCG/2ML IJ SOLN
25.0000 ug | INTRAMUSCULAR | Status: DC | PRN
  Filled 2017-04-10: qty 1

## 2017-04-10 MED ORDER — CEFAZOLIN SODIUM-DEXTROSE 2-4 GM/100ML-% IV SOLN
2.0000 g | Freq: Once | INTRAVENOUS | Status: DC
  Filled 2017-04-10: qty 100

## 2017-04-10 MED ORDER — ONDANSETRON HCL 4 MG/2ML IJ SOLN
INTRAMUSCULAR | Status: DC | PRN
  Administered 2017-04-10: 4 mg via INTRAVENOUS

## 2017-04-10 MED ORDER — OXYCODONE HCL 5 MG PO TABS
5.0000 mg | ORAL_TABLET | Freq: Once | ORAL | Status: AC
  Administered 2017-04-10: 5 mg via ORAL
  Filled 2017-04-10: qty 1

## 2017-04-10 MED ORDER — FENTANYL CITRATE (PF) 100 MCG/2ML IJ SOLN
INTRAMUSCULAR | Status: DC | PRN
  Administered 2017-04-10 (×2): 50 ug via INTRAVENOUS

## 2017-04-10 MED ORDER — ROCURONIUM BROMIDE 50 MG/5ML IV SOSY
PREFILLED_SYRINGE | INTRAVENOUS | Status: AC
  Filled 2017-04-10: qty 5

## 2017-04-10 MED ORDER — CIPROFLOXACIN IN D5W 400 MG/200ML IV SOLN
400.0000 mg | Freq: Once | INTRAVENOUS | Status: AC
  Administered 2017-04-10: 400 mg via INTRAVENOUS
  Filled 2017-04-10: qty 200

## 2017-04-10 SURGICAL SUPPLY — 33 items
BAG DRAIN URO-CYSTO SKYTR STRL (DRAIN) ×2 IMPLANT
BAG DRN ANRFLXCHMBR STRAP LEK (BAG) ×1
BAG DRN UROCATH (DRAIN) ×1
BAG URINE DRAINAGE (UROLOGICAL SUPPLIES) IMPLANT
BAG URINE LEG 19OZ MD ST LTX (BAG) ×1 IMPLANT
BAG URINE LEG 500ML (DRAIN) IMPLANT
BALLN NEPHROSTOMY (BALLOONS)
BALLOON NEPHROSTOMY (BALLOONS) IMPLANT
CATH FOLEY 2W COUNCIL 5CC 18FR (CATHETERS) ×1 IMPLANT
CATH FOLEY 2WAY SLVR  5CC 16FR (CATHETERS)
CATH FOLEY 2WAY SLVR 5CC 16FR (CATHETERS) IMPLANT
CATH INTERMIT  6FR 70CM (CATHETERS) ×1 IMPLANT
CLOTH BEACON ORANGE TIMEOUT ST (SAFETY) ×3 IMPLANT
ELECT REM PT RETURN 9FT ADLT (ELECTROSURGICAL)
ELECTRODE REM PT RTRN 9FT ADLT (ELECTROSURGICAL) ×1 IMPLANT
GLOVE BIO SURGEON STRL SZ7.5 (GLOVE) ×2 IMPLANT
GOWN STRL REUS W/ TWL LRG LVL3 (GOWN DISPOSABLE) ×1 IMPLANT
GOWN STRL REUS W/ TWL XL LVL3 (GOWN DISPOSABLE) ×1 IMPLANT
GOWN STRL REUS W/TWL LRG LVL3 (GOWN DISPOSABLE) ×2
GOWN STRL REUS W/TWL XL LVL3 (GOWN DISPOSABLE) ×2
GUIDEWIRE 0.038 PTFE COATED (WIRE) ×2 IMPLANT
GUIDEWIRE ANG ZIPWIRE 038X150 (WIRE) IMPLANT
GUIDEWIRE STR DUAL SENSOR (WIRE) IMPLANT
KIT BALLIN UROMAX 15FX10 (LABEL) IMPLANT
KIT RM TURNOVER CYSTO AR (KITS) ×2 IMPLANT
MANIFOLD NEPTUNE II (INSTRUMENTS) ×1 IMPLANT
NEEDLE HYPO 22GX1.5 SAFETY (NEEDLE) IMPLANT
NS IRRIG 500ML POUR BTL (IV SOLUTION) IMPLANT
PACK CYSTO (CUSTOM PROCEDURE TRAY) ×2 IMPLANT
SET HIGH PRES BAL DIL (LABEL) ×1
SYRINGE IRR TOOMEY STRL 70CC (SYRINGE) IMPLANT
TUBE CONNECTING 12X1/4 (SUCTIONS) ×1 IMPLANT
WATER STERILE IRR 3000ML UROMA (IV SOLUTION) ×2 IMPLANT

## 2017-04-10 NOTE — H&P (Signed)
Office Visit Report     03/15/2017   --------------------------------------------------------------------------------   Kent Flowers  MRN: [id number]  PRIMARY CARE:  Owens Loffler, MD  DOB: Apr 27, 1946, 71 year old Male  REFERRING:    SSN: [id number]  PROVIDER:  Festus Aloe, M.D.    LOCATION:  [facility] [phone]   --------------------------------------------------------------------------------   CC: I have blood in my urine.  HPI: Kent Flowers is a 71 year-old male established patient who is here for blood in the urine.  He did see the blood in his urine. He first noticed the symptoms 02/03/2017. He has seen blood clots.   He does not have a burning sensation when he urinates. He is not currently having trouble urinating.   He is not having pain.   His last U/S or CT Scan was 02/13/2017.   Last seen in October 2016: He had two episodes of gross hematuria with 1st and 2nd void this past weekend. No burning or pain with voiding. No worsening of urine stream and he does think his urgency has worsened over the past 6 months but not acutely. Denies fevers or renal colic.   Former smoker quitting 5 years ago. Continues TRT q 2 weeks with injections. He has a history of gonorrhea and canceled a scheduled dilation with Dr. Serita Butcher. CT was benign.     CC: I am having trouble with my erections.  HPI: He first stated noticing pain on approximately 02/16/2012. His symptoms did begin gradually. His symptoms have been stable over the last year.   He does have difficulties achieving an erection. He does have problems maintaining his erections. His erections are straight. He has tried Viagra. It did work.     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, Rotator Cuff Repair   NON-GU PSH: None   GU PMH: Gross hematuria - 02/06/2017 BPH w/o LUTS, Benign localized prostatic hyperplasia without lower urinary tract symptoms (LUTS) - 10/14/2015, Benign localized hyperplasia of prostate, - 2015 ED, arterial insufficiency, Erectile dysfunction due to arterial insufficiency - 10/14/2015 Testicular hypofunction, Hypogonadism, testicular - 10/14/2015 Other microscopic hematuria, Microscopic hematuria - 2015, Microscopic Hematuria, - 2014 Acute Cystitis, Acute Cystitis - 2014 Balanitis, Balanitis - 2014 Carcinoma in situ of penis, Carcinoma in situ of penis - 2014 Low back pain, Lower back pain - 2014 Neoplasm unspecified behavior of other genitourinary organ, Neoplasm of penis - 2014 Peyronies Disease, Peyronie's disease - 2014 Urethral Stricture, Unspec, Urethral stricture - 2014      PMH Notes:  2011-11-22 10:27:20 - Note: Neoplasm Of The Prostate Gland  2007-01-31 11:56:21 - Note: Arthritis   NON-GU PMH: Encounter for general adult medical examination without abnormal findings, Encounter for preventive health examination - 04/05/2015 Lumbago with sciatica, left side, Chronic left-sided low back pain with left-sided sciatica - 04/05/2015 Decreased libido, Libido, Decreased - 2014 Herpesviral infection of urogenital system, unspecified, Herpes, genital - 2014 Other specified disorders of bone density and structure, unspecified site, Osteopenia - 2014 Personal history of other endocrine, nutritional and metabolic disease, History of  hypercholesterolemia - 2014    FAMILY HISTORY: Family Health Status Number - Runs In Family No pertinent family history - Other   SOCIAL HISTORY: Marital Status: Married Current Smoking Status: Patient does not smoke anymore.   Tobacco Use  Assessment Completed: Used Tobacco in last 30 days? Does drink.  Drinks 1 caffeinated drink per day.     Notes: Former smoker, Death In The Family Father, Occupation:, Marital History - Currently Married, Alcohol Use, Death In The Family Mother, Tobacco Use   REVIEW OF SYSTEMS:    GU Review Male:   Patient reports frequent urination, hard to postpone urination, get up at night to urinate, and trouble starting your stream. Patient denies burning/ pain with urination, leakage of urine, stream starts and stops, have to strain to urinate , erection problems, and penile pain.  Gastrointestinal (Upper):   Patient denies nausea, vomiting, and indigestion/ heartburn.  Gastrointestinal (Lower):   Patient denies diarrhea and constipation.  Constitutional:   Patient denies fever, night sweats, weight loss, and fatigue.  Skin:   Patient reports skin rash/ lesion. Patient denies itching.  Eyes:   Patient denies blurred vision and double vision.  Ears/ Nose/ Throat:   Patient denies sore throat and sinus problems.  Hematologic/Lymphatic:   Patient denies swollen glands and easy bruising.  Cardiovascular:   Patient denies leg swelling and chest pains.  Respiratory:   Patient denies cough and shortness of breath.  Endocrine:   Patient denies excessive thirst.  Musculoskeletal:   Patient reports joint pain. Patient denies back pain.  Neurological:   Patient denies headaches and dizziness.  Psychologic:   Patient denies depression and anxiety.   VITAL SIGNS:      03/15/2017 01:21 PM  Weight 170 lb / 77.11 kg  Height 67 in / 170.18 cm  BP 116/78 mmHg  Pulse 110 /min  Temperature 98.6 F / 37 C  BMI 26.6 kg/m   GU PHYSICAL EXAMINATION:    Scrotum: No lesions. No edema. No cysts. No warts.  Urethral Meatus: Normal size. No lesion, no wart, no discharge, no polyp. Normal location.  Penis: Circumcised, no warts, no cracks. No dorsal Peyronie's plaques, no left corporal Peyronie's plaques, no right  corporal Peyronie's plaques, no scarring, no warts. No balanitis, no meatal stenosis.   MULTI-SYSTEM PHYSICAL EXAMINATION:    Constitutional: Well-nourished. No physical deformities. Normally developed. Good grooming.  Neck: Neck symmetrical, not swollen. Normal tracheal position.  Respiratory: No labored breathing, no use of accessory muscles.   Cardiovascular: Normal temperature, normal extremity pulses, no swelling, no varicosities.  Skin: No paleness, no jaundice, no cyanosis. No lesion, no ulcer, no rash.  Neurologic / Psychiatric: Oriented to time, oriented to place, oriented to person. No depression, no anxiety, no agitation.     PAST DATA REVIEWED:  Source Of History:  Patient   10/09/15 08/26/14 11/05/13 11/13/12 11/08/11 09/08/10 04/28/09 01/31/07  PSA  Total PSA 0.40  0.46  0.37  0.48  0.31  0.96  0.34  0.28     10/09/15 08/26/14 11/05/13 03/04/13 02/25/13 01/18/13 12/25/12 11/16/12  Hormones  Testosterone, Total 716  205  509  477  848  321.15  156.76  184     PROCEDURES:         Flexible Cystoscopy - 52000  Risks, benefits, and some of the potential complications of the procedure were discussed with the patient. All questions were answered. Informed consent was obtained. Antibiotic prophylaxis was given -- Cipro. Sterile technique and intraurethral  analgesia were used.  Meatus:  Normal size. Normal location. Normal condition.  Urethra:  Severe bulbous stricture.      The lower urinary tract was carefully examined. The procedure was well-tolerated and without complications. Antibiotic instructions were given. Instructions were given to call the office immediately for bloody urine, difficulty urinating, painful urination, fever, chills, nausea, vomiting or other illness. The patient stated that he understood these instructions and would comply with them.         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   gross hematuria - incomplete evaluation. We discussed the nature risks and benefits of observing the stricture or proceeding with cystoscopic evaluation. Patient wants to complete the cystoscopic evaluation.   Urethral stricture-we discussed the nature risks benefits and alternatives to urethral stricture dilation. We discussed urethral strictures likely recur after dilation.   cc: Dr. Lorelei Pont     * Signed by Festus Aloe, M.D. on 03/16/17 at 6:02 PM (EDT)*     The information contained in this medical record document is considered private and confidential patient information. This information can only be used for the medical diagnosis and/or medical services that are being provided by the patient's selected caregivers. This information can only be distributed outside of the patient's care if the patient agrees and signs waivers of authorization for this information to be sent to an outside source or route.

## 2017-04-10 NOTE — Anesthesia Preprocedure Evaluation (Addendum)
Anesthesia Evaluation  Patient identified by MRN, date of birth, ID band Patient awake    Reviewed: Allergy & Precautions, NPO status , Patient's Chart, lab work & pertinent test results  Airway Mallampati: II  TM Distance: >3 FB Neck ROM: Full    Dental   Pulmonary COPD,  COPD inhaler, former smoker,    breath sounds clear to auscultation       Cardiovascular (-) angina+ CAD and + Peripheral Vascular Disease   Rhythm:Regular Rate:Normal     Neuro/Psych negative neurological ROS     GI/Hepatic negative GI ROS, Neg liver ROS,   Endo/Other  negative endocrine ROS  Renal/GU negative Renal ROS     Musculoskeletal  (+) Arthritis , Osteoarthritis,  Chronic lower back pain/ Sciatica L>R   Abdominal   Peds  Hematology negative hematology ROS (+)   Anesthesia Other Findings   Reproductive/Obstetrics                           Anesthesia Physical Anesthesia Plan  ASA: III  Anesthesia Plan: General   Post-op Pain Management:    Induction: Intravenous  Airway Management Planned: LMA  Additional Equipment:   Intra-op Plan:   Post-operative Plan: Extubation in OR  Informed Consent: I have reviewed the patients History and Physical, chart, labs and discussed the procedure including the risks, benefits and alternatives for the proposed anesthesia with the patient or authorized representative who has indicated his/her understanding and acceptance.   Dental advisory given  Plan Discussed with:   Anesthesia Plan Comments:         Anesthesia Quick Evaluation

## 2017-04-10 NOTE — Transfer of Care (Signed)
Immediate Anesthesia Transfer of Care Note  Patient: Kent Flowers  Procedure(s) Performed: Procedure(s): CYSTOSCOPY WITH BALLOON URETHRALSTRICTURE DILATATION (N/A) POSSIBLE BLADDER  BIOPSY (N/A)  Patient Location: PACU  Anesthesia Type:General  Level of Consciousness: awake, alert , oriented and patient cooperative  Airway & Oxygen Therapy: Patient Spontanous Breathing and Patient connected to nasal cannula oxygen  Post-op Assessment: Report given to RN and Post -op Vital signs reviewed and stable  Post vital signs: Reviewed and stable  Last Vitals:  Vitals:   04/10/17 0919  BP: 133/75  Pulse: 66  Resp: 16  Temp: 37 C    Last Pain:  Vitals:   04/10/17 1011  TempSrc:   PainSc: 2       Patients Stated Pain Goal: 6 (12/75/17 0017)  Complications: No apparent anesthesia complications

## 2017-04-10 NOTE — Op Note (Addendum)
Preoperative diagnosis: Gross hematuria, urethral stricture Postoperative diagnosis: Same  Procedure: Cystoscopy, dilation of urethral stricture, Foley catheter placement  Surgeon: Junious Silk  Anesthesia: Gen.  Indication for procedure: 71 year old with history of gross hematuria. He is a former smoker. He had a urethral stricture and we could not perform cystoscopy without dilation. We discussed the nature risks benefits and alternatives to dilation as well as the temporary nature. All questions answered. He elected to proceed.  Findings: The entire urethra was quite tight. Even the penile urethra had difficulty accepting the 23 French sheath and I switched to the 17.5 [place] sheath which was also tight. There was a 1 cm stricture in the proximal bulb. Given the narrowing of the entire urethra opted for the 15 French balloon and set of the 24 [place] balloon. The prostatic urethra was normal. The bladder was normal. The trigone and ureteral orifices were normal. There were no stones or foreign bodies in the bladder. Bladder mucosa appeared normal. No tumors. There was some mild edema and erythema posteriorly along the course of the sensor wire.  I should add a large capacity bladder was noted during the cystoscopy. The patient admitted in the PACU he has a very weak stream at times. Also, his wife said he voids frequently.   Description of procedure: After consent was obtained patient brought to the operating room. After adequate anesthesia he was placed in lithotomy position and prepped and draped in the usual sterile fashion. A timeout was performed to confirm the patient and procedure. The cystoscope was passed per urethra but it was difficult and tight. Therefore I switched to a 17.5 French sheath and this went easily or but was tight along the bulb urethra. I then encountered the stricture and advanced a sensor wire. The sensor wire initially did not go down the true lumen and try to create a false  passage where scope passage and dilated. I then advanced a sensor wire through the stricture and coiled in the bladder. To be certain I passed a 6 [place] open-ended catheter and remove the wire. I injected contrast which outlined a normal appearing bladder. The sensor wire was readvanced and the 6 [place] open-ended catheter removed. I passed a 10 cm 15 French balloon and inflated it to 18 atm. It was some wasting at the stricture which then opened. I left the balloon inflated for 3 minutes. The balloon was deflated and removed. I still had trouble passing the 17.5 French sheath and cystoscope through the stricture. Therefore a backed this out and over the wire passed a 16 and 18 French pain and dilator over the wire to dilate the least I needed to advance the scope. Still the scope wouldn't pass, so I made one more pass with a 20 [place] Heyman. Now the 17.5 French sheath and cystoscope passed into the remainder of the bulbar urethra and up through the prostatic urethra into the bladder. I carefully examined the bladder with the 30 and 70 lens. The cystoscope was then backed out. Over the wire and 18 [place] council tip catheter was placed. The balloon was inflated and the catheter left to gravity drainage. Drainage was clear.  The patient was then awakened and taken to the recovery room in stable condition.  Complications: None  Blood loss: Minimal  Specimens: None  Drains: 18 [place] council tip catheter

## 2017-04-10 NOTE — Interval H&P Note (Signed)
History and Physical Interval Note:  04/10/2017 11:47 AM  Kent Flowers  has presented today for surgery, with the diagnosis of ureteral stricture gross hematuria  The various methods of treatment have been discussed with the patient and family. After consideration of risks, benefits and other options for treatment, the patient has consented to  Procedure(s): CYSTOSCOPY WITH BALLOON URETHRALSTRICTURE DILATATION (N/A) POSSIBLE BLADDER  BIOPSY (N/A) as a surgical intervention .  The patient's history has been reviewed, patient examined, no change in status, stable for surgery. Again discussed side effects of the proposed treatment, the likelihood of the patient achieving the goals of the procedure, and any potential problems that might occur during the procedure or recuperation. All questions answered. Patient elects to proceed. Discussed foley for a few days.   I have reviewed the patient's chart and labs. He's been well. No fever or dysuria.    Kent Flowers

## 2017-04-10 NOTE — Discharge Instructions (Signed)
Indwelling Urinary Catheter Care, Adult °Take good care of your catheter to keep it working and to prevent problems. °How to wear your catheter °Attach your catheter to your leg with tape (adhesive tape) or a leg strap. Make sure it is not too tight. If you use tape, remove any bits of tape that are already on the catheter. °How to wear a drainage bag °You should have: °· A large overnight bag. °· A small leg bag. °Overnight Bag  °You may wear the overnight bag at any time. Always keep the bag below the level of your bladder but off the floor. When you sleep, put a clean plastic bag in a wastebasket. Then hang the bag inside the wastebasket. °Leg Bag  °Never wear the leg bag at night. Always wear the leg bag below your knee. Keep the leg bag secure with a leg strap or tape. °How to care for your skin °· Clean the skin around the catheter at least once every day. °· Shower every day. Do not take baths. °· Put creams, lotions, or ointments on your genital area only as told by your doctor. °· Do not use powders, sprays, or lotions on your genital area. °How to clean your catheter and your skin °1. Wash your hands with soap and water. °2. Wet a washcloth in warm water and gentle (mild) soap. °3. Use the washcloth to clean the skin where the catheter enters your body. Clean downward and wipe away from the catheter in small circles. Do not wipe toward the catheter. °4. Pat the area dry with a clean towel. Make sure to clean off all soap. °How to care for your drainage bags °Empty your drainage bag when it is ?-½ full or at least 2-3 times a day. Replace your drainage bag once a month or sooner if it starts to smell bad or look dirty. Do not clean your drainage bag unless told by your doctor. °Emptying a drainage bag  ° °Supplies Needed °· Rubbing alcohol. °· Gauze pad or cotton ball. °· Tape or a leg strap. °Steps °1. Wash your hands with soap and water. °2. Separate (detach) the bag from your leg. °3. Hold the bag over  the toilet or a clean container. Keep the bag below your hips and bladder. This stops pee (urine) from going back into the tube. °4. Open the pour spout at the bottom of the bag. °5. Empty the pee into the toilet or container. Do not let the pour spout touch any surface. °6. Put rubbing alcohol on a gauze pad or cotton ball. °7. Use the gauze pad or cotton ball to clean the pour spout. °8. Close the pour spout. °9. Attach the bag to your leg with tape or a leg strap. °10. Wash your hands. °Changing a drainage bag  °Supplies Needed °· Alcohol wipes. °· A clean drainage bag. °· Adhesive tape or a leg strap. °Steps °1. Wash your hands with soap and water. °2. Separate the dirty bag from your leg. °3. Pinch the rubber catheter with your fingers so that pee does not spill out. °4. Separate the catheter tube from the drainage tube where these tubes connect (at the connection valve). Do not let the tubes touch any surface. °5. Clean the end of the catheter tube with an alcohol wipe. Use a different alcohol wipe to clean the end of the drainage tube. °6. Connect the catheter tube to the drainage tube of the clean bag. °7. Attach the new bag to   the leg with adhesive tape or a leg strap. °8. Wash your hands. °How to prevent infection and other problems °· Never pull on your catheter or try to remove it. Pulling can damage tissue in your body. °· Always wash your hands before and after touching your catheter. °· If a leg strap gets wet, replace it with a dry one. °· Drink enough fluids to keep your pee clear or pale yellow, or as told by your doctor. °· Do not let the drainage bag or tubing touch the floor. °· Wear cotton underwear. °· If you are male, wipe from front to back after you poop (have a bowel movement). °· Check on the catheter often to make sure it works and the tubing is not twisted. °Get help if: °· Your pee is cloudy. °· Your pee smells unusually bad. °· Your pee is not draining into the bag. °· Your tube  gets clogged. °· Your catheter starts to leak. °· Your bladder feels full. °Get help right away if: °· You have redness, swelling, or pain where the catheter enters your body. °· You have fluid, pus, or a bad smell coming from the area where the catheter enters your body. °· The area where the catheter enters your body feels warm. °· You have a fever. °· You have pain in your: °¨ Stomach (abdomen). °¨ Legs. °¨ Lower back. °¨ Bladder. °· You see blood fill the catheter. °· Your pee is pink or red. °· You feel sick to your stomach (nauseous). °· You throw up (vomit). °· You have chills. °· Your catheter gets pulled out. °This information is not intended to replace advice given to you by your health care provider. Make sure you discuss any questions you have with your health care provider. °Document Released: 03/31/2013 Document Revised: 11/01/2016 Document Reviewed: 05/19/2014 °Elsevier Interactive Patient Education © 2017 [employer] ° ° °Post Anesthesia Home Care Instructions ° °Activity: °Get plenty of rest for the remainder of the day. A responsible individual must stay with you for 24 hours following the procedure.  °For the next 24 hours, DO NOT: °-Drive a car °-Operate machinery °-Drink alcoholic beverages °-Take any medication unless instructed by your physician °-Make any legal decisions or sign important papers. ° °Meals: °Start with liquid foods such as gelatin or soup. Progress to regular foods as tolerated. Avoid greasy, spicy, heavy foods. If nausea and/or vomiting occur, drink only clear liquids until the nausea and/or vomiting subsides. Call your physician if vomiting continues. ° °Special Instructions/Symptoms: °Your throat may feel dry or sore from the anesthesia or the breathing tube placed in your throat during surgery. If this causes discomfort, gargle with warm salt water. The discomfort should disappear within 24 hours. ° °If you had a scopolamine patch placed behind your ear for the  management of post- operative nausea and/or vomiting: ° °1. The medication in the patch is effective for 72 hours, after which it should be removed.  Wrap patch in a tissue and discard in the trash. Wash hands thoroughly with soap and water. °2. You may remove the patch earlier than 72 hours if you experience unpleasant side effects which may include dry mouth, dizziness or visual disturbances. °3. Avoid touching the patch. Wash your hands with soap and water after contact with the patch. °  ° ° ° °

## 2017-04-10 NOTE — Anesthesia Postprocedure Evaluation (Signed)
Anesthesia Post Note  Patient: EVERARDO VORIS  Procedure(s) Performed: Procedure(s) (LRB): CYSTOSCOPY WITH BALLOON URETHRALSTRICTURE DILATATION (N/A) POSSIBLE BLADDER  BIOPSY (N/A)  Patient location during evaluation: PACU Anesthesia Type: General Level of consciousness: awake and alert Pain management: pain level controlled Vital Signs Assessment: post-procedure vital signs reviewed and stable Respiratory status: spontaneous breathing, nonlabored ventilation, respiratory function stable and patient connected to nasal cannula oxygen Cardiovascular status: blood pressure returned to baseline and stable Postop Assessment: no signs of nausea or vomiting Anesthetic complications: no       Last Vitals:  Vitals:   04/10/17 1315 04/10/17 1330  BP: 138/77 132/88  Pulse: 66 66  Resp: 14 11  Temp:      Last Pain:  Vitals:   04/10/17 1330  TempSrc:   PainSc: 0-No pain                 Madalynn Pickelsimer S

## 2017-04-10 NOTE — Anesthesia Procedure Notes (Signed)
Procedure Name: LMA Insertion Date/Time: 04/10/2017 11:48 AM Performed by: Wanita Chamberlain Pre-anesthesia Checklist: Patient identified, Timeout performed, Emergency Drugs available, Suction available and Patient being monitored Patient Re-evaluated:Patient Re-evaluated prior to inductionOxygen Delivery Method: Circle system utilized Preoxygenation: Pre-oxygenation with 100% oxygen Intubation Type: IV induction Ventilation: Mask ventilation without difficulty LMA: LMA inserted LMA Size: 4.0 Placement Confirmation: positive ETCO2 and breath sounds checked- equal and bilateral Tube secured with: Tape Dental Injury: Teeth and Oropharynx as per pre-operative assessment

## 2017-04-11 ENCOUNTER — Encounter (HOSPITAL_BASED_OUTPATIENT_CLINIC_OR_DEPARTMENT_OTHER): Payer: Self-pay | Admitting: Urology

## 2017-04-11 LAB — TESTOSTERONE: Testosterone: 93 ng/dL — ABNORMAL LOW (ref 264–916)

## 2017-04-14 ENCOUNTER — Encounter (HOSPITAL_COMMUNITY): Payer: Self-pay

## 2017-04-14 ENCOUNTER — Emergency Department (HOSPITAL_COMMUNITY)
Admission: EM | Admit: 2017-04-14 | Discharge: 2017-04-14 | Disposition: A | Discharge status: Home / Self Care | Payer: Medicare Other | Attending: Emergency Medicine | Admitting: Emergency Medicine

## 2017-04-14 DIAGNOSIS — Z87891 Personal history of nicotine dependence: Secondary | ICD-10-CM | POA: Insufficient documentation

## 2017-04-14 DIAGNOSIS — R369 Urethral discharge, unspecified: Secondary | ICD-10-CM | POA: Insufficient documentation

## 2017-04-14 DIAGNOSIS — I2510 Atherosclerotic heart disease of native coronary artery without angina pectoris: Secondary | ICD-10-CM | POA: Diagnosis not present

## 2017-04-14 DIAGNOSIS — J449 Chronic obstructive pulmonary disease, unspecified: Secondary | ICD-10-CM | POA: Diagnosis not present

## 2017-04-14 DIAGNOSIS — N4889 Other specified disorders of penis: Secondary | ICD-10-CM | POA: Diagnosis not present

## 2017-04-14 MED ORDER — LEVOFLOXACIN 500 MG PO TABS
500.0000 mg | ORAL_TABLET | Freq: Once | ORAL | Status: AC
  Administered 2017-04-14: 500 mg via ORAL
  Filled 2017-04-14: qty 1

## 2017-04-14 MED ORDER — LEVOFLOXACIN 500 MG PO TABS: 500.0000 mg | ORAL_TABLET | Freq: Every day | ORAL | 0 refills | Status: DC

## 2017-04-14 NOTE — Discharge Instructions (Signed)
It was our pleasure to provide your ER care today - we hope that you feel better.  Keep your penis in an elevated/superior position as much as possible - for example, rest, lying on back.    You may apply cold/icepack to swollen area. Keep area very clean.  Follow up with your urologist Monday as arranged.  Return to ER if worse, new symptoms, fevers, severe pain to area, spreading redness, other concern.

## 2017-04-14 NOTE — ED Triage Notes (Signed)
He states he had recent cystoscopy with meatotomy. Here today r/t swelling of foreskin and meatal drainage.

## 2017-04-14 NOTE — ED Provider Notes (Signed)
[facility] DEPT Provider Note   CSN: [id number] Arrival date & time: 04/14/17  1052     History   Chief Complaint Chief Complaint  Patient presents with  . Penile Discharge    HPI Kent Flowers is a 71 y.o. male.  Patient c/o swelling to head of penis, and mild purulent drainage from tip of penis - recent surgical procedure for urethral stricture. Symptoms present for past couple days, mild-moderate. Has foley catheter which is draining fine. No fever or chills. No severe pain to area. States initially he did not fill abx rx post surgery, but has taken one dose in the past 2 days. Does not feel sick or ill.    The history is provided by the patient.  Penile Discharge  Pertinent negatives include no abdominal pain.    Past Medical History:  Diagnosis Date  . Benign localized prostatic hyperplasia with lower urinary tract symptoms (LUTS)   . Chronic low back pain   . COPD (chronic obstructive pulmonary disease) with emphysema (HCC)    PULMOLOGIST-- DR Tarri Fuller YOUNG  . 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 ED (erectile dysfunction)   . Full dentures   . Gross hematuria   . 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 Hypogonadism male   . Knee pain, bilateral    INTERMITTENT--  MENISCUS  . OA (osteoarthritis)    KNEES  . Peyronie's disease   . Pulmonary nodules followed by dr c. young (pulmologist)   LLL and LUL-- per last CT 10-01-2013  stable and previous right lung nodule not seen  . Thoracic ascending aortic aneurysm (HCC)    STABLE PER LAST CT 10-01-2013  4CM--  ECTATIC   . Urethral stricture   . Urgency of urination   . Wears glasses     Patient Active Problem List   Diagnosis Date Noted  . Other social stressor 07/19/2016  . COPD with acute bronchitis ([place]) 10/04/2014  . Hepatitis, unspecified 03/13/2014  . Chronic low back pain   . Knee pain, bilateral    . Right knee DJD   . Hepatitis 11/03/2013  . 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 Aortic calcification ([place]) 11/03/2013  . Coronary atherosclerosis of native coronary artery 11/03/2013  . Penile neoplasm   . Ascending aortic aneurysm ([place])   . Lung nodule 05/23/2012  . COPD (chronic obstructive pulmonary disease) with emphysema ([place]) 02/24/2012    Past Surgical History:  Procedure Laterality Date  . CYSTOSCOPY WITH BIOPSY N/A 04/10/2017   Procedure: POSSIBLE BLADDER  BIOPSY;  Surgeon: Festus Aloe, MD;  Location[facility];  Service: Urology;  Laterality: N/A;  . CYSTOSCOPY WITH URETHRAL DILATATION N/A 04/10/2017   Procedure: CYSTOSCOPY WITH BALLOON URETHRALSTRICTURE DILATATION;  Surgeon: Festus Aloe, MD;  Location: [facility];  Service: Urology;  Laterality: N/A;  . PENILE BIOPSY N/A 03/14/2013   Procedure: PENILE BIOPSY;  Surgeon: Fredricka Bonine, MD;  Location: [facility];  Service: Urology;  Laterality: N/A;  . REATTACHMENT LEFT INDEX FINGER  1988  . SHOULDER ARTHROSCOPY WITH ROTATOR CUFF REPAIR AND SUBACROMIAL DECOMPRESSION Left 2004  . TONSILLECTOMY  AS CHILD       Home Medications    Prior to Admission medications   Medication Sig Start Date End Date Taking? Authorizing Provider  albuterol (PROAIR HFA) 108 (90 Base) MCG/ACT inhaler INHALE 2  PUFFS INTO THE LUNGS EVERY 4 HOURS AS NEEDED FOR WHEEZING ORSHORTNESS OF BREATH 04/06/17   Deneise Lever, MD  diclofenac (VOLTAREN) 50 MG EC tablet Take 50 mg by mouth 3 (three) times daily as needed.     Historical Provider, MD  gabapentin (NEURONTIN) 300 MG capsule Take 300 mg by mouth 3 (three) times daily as needed.    Historical Provider, MD  hydrOXYzine (ATARAX/VISTARIL) 10 MG tablet Take 2 tablets (20 mg total) by mouth 2 (two) times daily as needed for anxiety (sedation caution). 02/02/17   Tonia Ghent, MD  nitrofurantoin (MACRODANTIN) 100 MG capsule Take 1  capsule (100 mg total) by mouth at bedtime. 04/10/17   Festus Aloe, MD  oxyCODONE (ROXICODONE) 15 MG immediate release tablet Take 10 mg by mouth every 4 (four) hours as needed for pain.    Historical Provider, MD    Family History Family History  Problem Relation Age of Onset  . Emphysema Mother   . Cancer Father     bile duct    Social History Social History  Substance Use Topics  . Smoking status: Former Smoker    Packs/day: 1.00    Years: 12.00    Types: Cigarettes    Quit date: 12/18/2009  . Smokeless tobacco: Never Used  . Alcohol use 1.8 oz/week    3 Glasses of wine per week     Allergies   Ceclor [cefaclor] and Sulfa antibiotics   Review of Systems Review of Systems  Constitutional: Negative for chills and fever.  Gastrointestinal: Negative for abdominal pain and vomiting.  Genitourinary: Positive for discharge. Negative for dysuria and testicular pain.  Skin: Negative for rash.     Physical Exam Updated Vital Signs BP (!) 159/82   Pulse 80   Temp 97.7 F (36.5 C)   Resp 16   SpO2 92%   Physical Exam  Constitutional: He appears well-developed and well-nourished. No distress.  Eyes: Conjunctivae are normal.  Neck: No tracheal deviation present.  Cardiovascular: Normal rate.   Pulmonary/Chest: Effort normal. No accessory muscle usage. No respiratory distress.  Genitourinary:  Genitourinary Comments: Foley catheter in place. Mild-mod edema to underside of head of penis/distal shaft. Minimal drainage around outside of catheter/distal urethra.  Clear urine going into bag.   Musculoskeletal: He exhibits no edema.  Neurological: He is alert.  Skin: Skin is warm and dry. He is not diaphoretic.  Psychiatric: He has a normal mood and affect.  Nursing note and vitals reviewed.    ED Treatments / Results  Labs (all labs ordered are listed, but only abnormal results are displayed) Labs Reviewed - No data to display  EKG  EKG Interpretation None        Radiology No results found.  Procedures Procedures (including critical care time)  Medications Ordered in ED Medications  levofloxacin (LEVAQUIN) tablet 500 mg (not administered)     Initial Impression / Assessment and Plan / ED Course  I have reviewed the triage vital signs and the nursing notes.  Pertinent labs & imaging results that were available during my care of the patient were reviewed by me and considered in my medical decision making (see chart for details).  Given mild purulence, will add additional abx coverage.   Pt is allergic to sulfa and ceclor.   levaquin po.    Pt has f/u already arrange in 2 days with his urologist.  Will cover w abx until then, discussed lying on back/keep penis in elevated position as  much as possible.   Return precautions provided.     Final Clinical Impressions(s) / ED Diagnoses   Final diagnoses:  None    New Prescriptions New Prescriptions   No medications on file     Lajean Saver, MD 04/14/17 1259

## 2017-04-16 DIAGNOSIS — N359 Urethral stricture, unspecified: Secondary | ICD-10-CM | POA: Diagnosis not present

## 2017-04-18 DIAGNOSIS — M9903 Segmental and somatic dysfunction of lumbar region: Secondary | ICD-10-CM | POA: Diagnosis not present

## 2017-04-18 DIAGNOSIS — M5432 Sciatica, left side: Secondary | ICD-10-CM | POA: Diagnosis not present

## 2017-04-18 DIAGNOSIS — M9901 Segmental and somatic dysfunction of cervical region: Secondary | ICD-10-CM | POA: Diagnosis not present

## 2017-04-18 DIAGNOSIS — M5136 Other intervertebral disc degeneration, lumbar region: Secondary | ICD-10-CM | POA: Diagnosis not present

## 2017-04-18 NOTE — Telephone Encounter (Signed)
Pt will call back

## 2017-05-02 DIAGNOSIS — M9903 Segmental and somatic dysfunction of lumbar region: Secondary | ICD-10-CM | POA: Diagnosis not present

## 2017-05-02 DIAGNOSIS — M9901 Segmental and somatic dysfunction of cervical region: Secondary | ICD-10-CM | POA: Diagnosis not present

## 2017-05-02 DIAGNOSIS — M5432 Sciatica, left side: Secondary | ICD-10-CM | POA: Diagnosis not present

## 2017-05-02 DIAGNOSIS — M5136 Other intervertebral disc degeneration, lumbar region: Secondary | ICD-10-CM | POA: Diagnosis not present

## 2017-05-03 ENCOUNTER — Ambulatory Visit (INDEPENDENT_AMBULATORY_CARE_PROVIDER_SITE_OTHER): Payer: Medicare Other | Admitting: Internal Medicine

## 2017-05-03 ENCOUNTER — Ambulatory Visit (INDEPENDENT_AMBULATORY_CARE_PROVIDER_SITE_OTHER)
Admission: RE | Admit: 2017-05-03 | Discharge: 2017-05-03 | Disposition: A | Discharge status: Home / Self Care | Payer: Medicare Other | Source: Ambulatory Visit | Attending: Internal Medicine | Admitting: Internal Medicine

## 2017-05-03 ENCOUNTER — Encounter: Payer: Self-pay | Admitting: Internal Medicine

## 2017-05-03 VITALS — BP 128/76 | HR 82 | Resp 16 | Ht 68.0 in | Wt 180.0 lb

## 2017-05-03 DIAGNOSIS — R911 Solitary pulmonary nodule: Secondary | ICD-10-CM | POA: Diagnosis not present

## 2017-05-03 DIAGNOSIS — Z0000 Encounter for general adult medical examination without abnormal findings: Secondary | ICD-10-CM | POA: Diagnosis not present

## 2017-05-03 DIAGNOSIS — J449 Chronic obstructive pulmonary disease, unspecified: Secondary | ICD-10-CM | POA: Diagnosis not present

## 2017-05-03 IMAGING — DX DG CHEST 2V
2 series · 2 of 2 positions shown · non-contrast
Comparison: [DATE]

CLINICAL DATA: Routine exam.  No current complaints.

EXAM:
CHEST  2 VIEW

[chest pa]
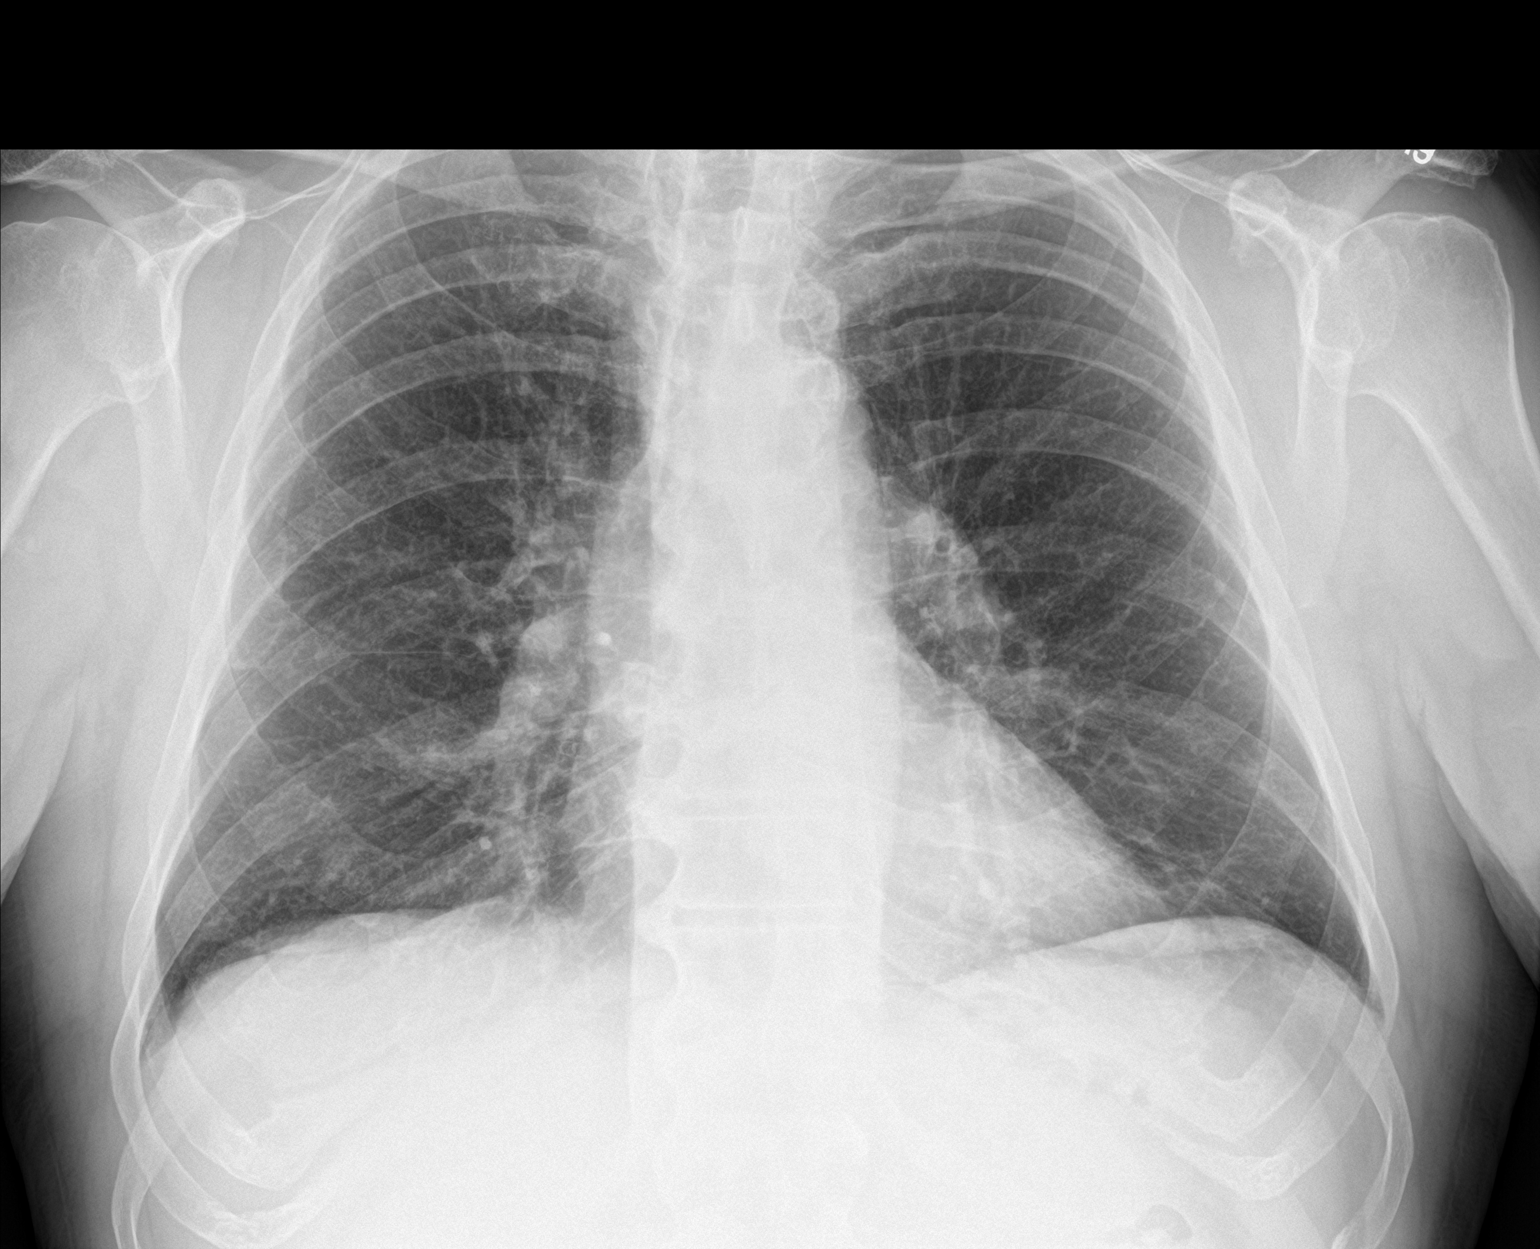

[chest lat]
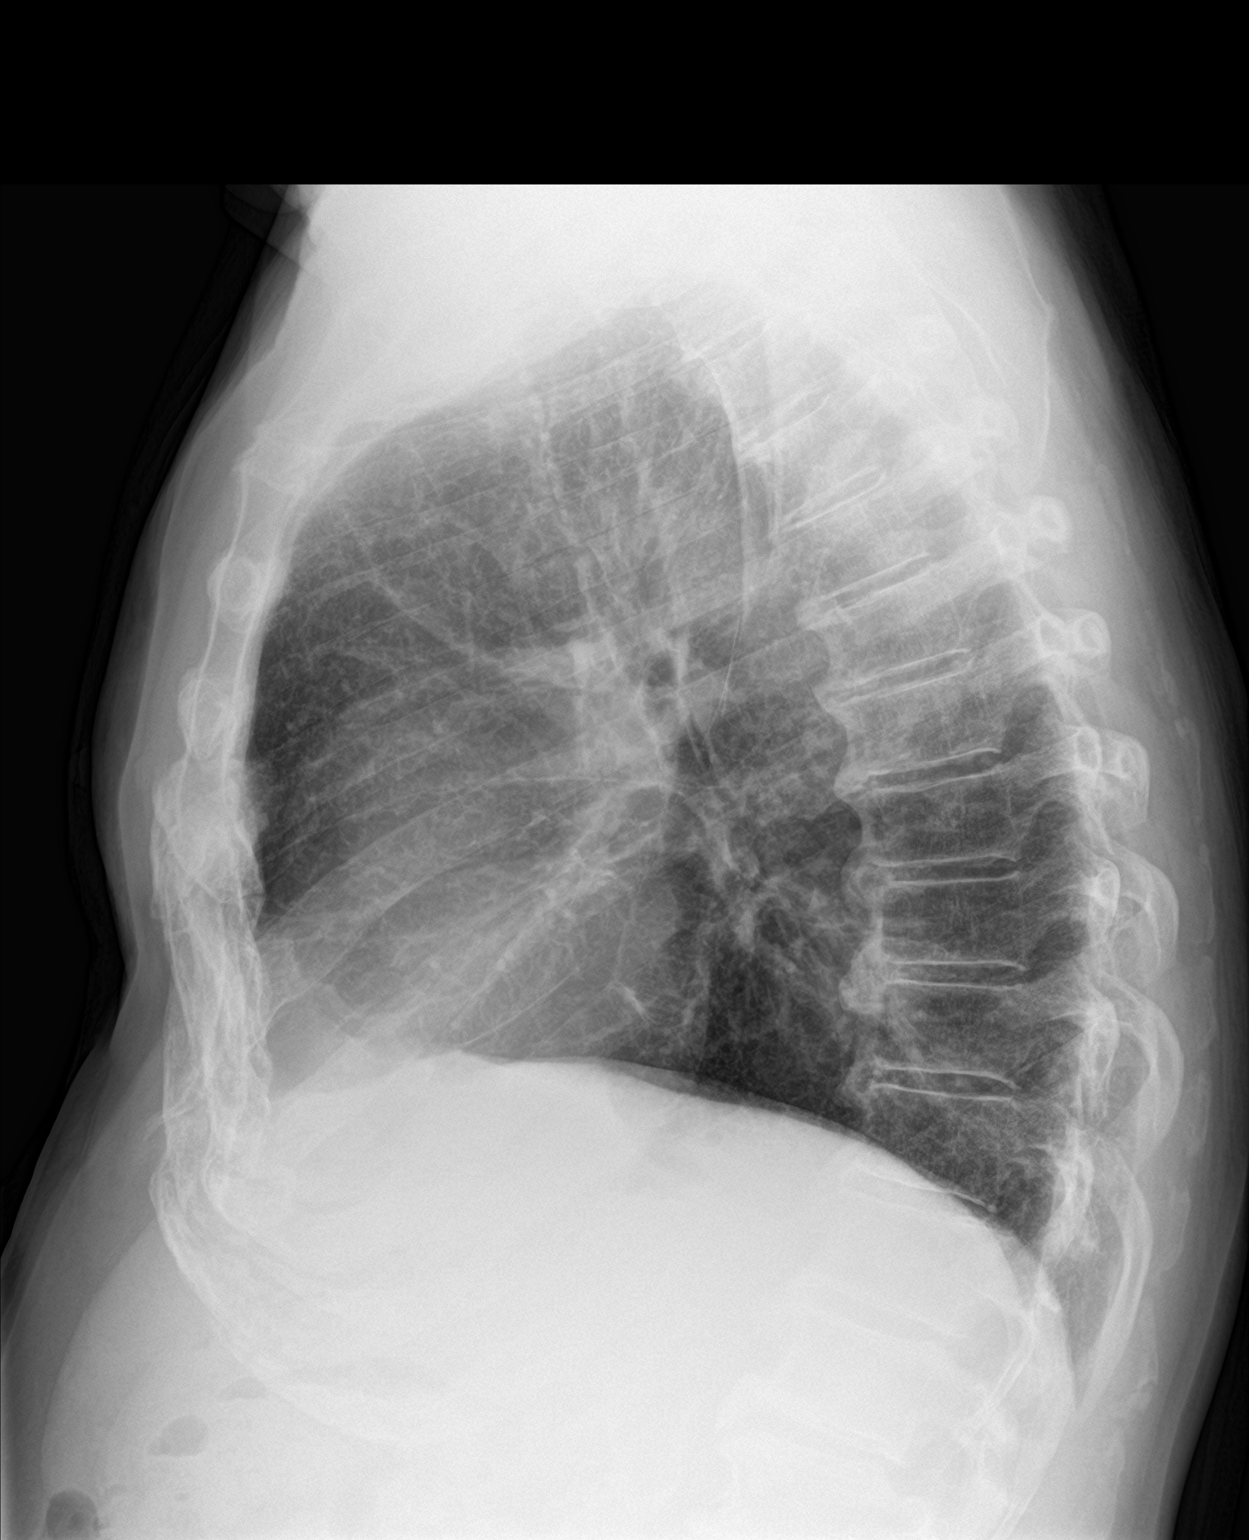

[2 of 2 positions shown; findings below may reference images not displayed]

FINDINGS: The heart size and mediastinal contours are within normal limits.
Both lungs are clear. The visualized skeletal structures are
unremarkable.
IMPRESSION: No active cardiopulmonary disease.

## 2017-05-03 MED ORDER — UMECLIDINIUM-VILANTEROL 62.5-25 MCG/INH IN AEPB: 1.0000 | INHALATION_SPRAY | Freq: Every day | RESPIRATORY_TRACT | 12 refills | Status: DC

## 2017-05-03 NOTE — Patient Instructions (Signed)
Order- CXR    Dx COPD mixed type  Order- Office Spirometry        Ok to continue present meds  Please call if we can help

## 2017-05-03 NOTE — Progress Notes (Signed)
HPI male former smoker followed for COPD, history lung nodules, complicated by osteoarthritis, ascending aortic aneurysm Spirometry 01/24/12- FVC 2.24/ 64%, FEV1 1.20/ 42%, FEV1/FVC 0.53 PFT: 03/19/2012-moderate obstructive airways disease, insignificant response to bronchodilator , air trapping,normal diffusion capacity. Loop contour suggests emphysema. FVC 3.73/91%, FEV1 2.07/73%, FEV1/FVC 0.56. TLC 114%, RV 138% , DLCO 90% CT chest 10/01/13- Visualized small lung nodules are benign based on current consensus criteria  Office Spirometry 05/03/17-moderately severe obstruction. FVC 3.25/79%, FEV1 1.77/58%, ratio 0.54, FEF 25-75% 0.88/38%  -------------------------------------------------------------------------------------------------------------------------------  04/27/2016-71 year old male former smoker followed for COPD, history lung nodules FOLLOWS FOR: Recent flare up -lasted about 4 weeks or so; Pt took Mucinex an complaint d rescue inhaler, also took Volatren to help as well.  05/03/17- 71 year old male former smoker followed for COPD, history lung nodules FOLLOW UP FOR yearly follow up for COPD  No major acute events this winter. It helped to add Anoro.. Uses rescue inhaler each morning and sometimes at bedtime as routine. Complains again the pain in his knees slows him down. CXR 05/12/16 IMPRESSION: No acute cardiopulmonary disease. Peribronchial thickening which may relate to chronic bronchitis or Smoking. Office Spirometry 05/03/17-moderately severe obstruction. FVC 3.25/79%, FEV1 1.77/58%, ratio 0.54, FEF 25-75% 0.88/38%  ROS-see HPI      + = pos Constitutional:   No-   weight loss, night sweats,  chills, fatigue, lassitude. HEENT:   No-  headaches, difficulty swallowing, tooth/dental problems,        No-  sneezing, itching, ear ache,  +nasal congestion, post nasal drip,  CV:  No- chest pain, orthopnea, PND, swelling in lower extremities, anasarca,  dizziness, palpitations Resp: +    shortness of breath with exertion or at rest.              +productive cough, no- non-productive cough,  No- coughing up of blood.             No change in color of mucus.  + wheezing.   Skin: No-   rash or lesions. GI:  No-   heartburn, indigestion, abdominal pain, nausea, vomiting GU: No-   dysuria,. MS:  +  joint pain or swelling.     Neuro-     nothing unusual Psych:  No- change in mood or affect. No depression or anxiety.  No memory loss.  OBJ- Physical Exam General- Alert, Oriented, Affect-appropriate, Distress- none acute Skin- rash-none, lesions- none, excoriation- none Lymphadenopathy- none Head- atraumatic            Eyes- Gross vision intact, PERRLA, conjunctivae and secretions clear            Ears- Hearing, + bilateral cerumen            Nose- Clear, no-Septal dev, mucus, polyps, erosion, perforation             Throat- Mallampati II , mucosa clear , drainage- none, tonsils- atrophic Neck- flexible , trachea midline, no stridor , thyroid nl, carotid no bruit Chest - symmetrical excursion , unlabored           Heart/CV- RRR , no murmur , no gallop  , no rub, nl s1 s2                           - JVD- none , edema- none, stasis changes- none, varices- none           Lung- + diminished, wheeze+ with laughter, cough- none , dullness-none, rub- none  Chest wall-  Abd-  Br/ Gen/ Rectal- Not done, not indicated Extrem- cyanosis- none, clubbing, none, atrophy- none, strength- nl Neuro- grossly intact to observation

## 2017-05-04 ENCOUNTER — Telehealth: Payer: Self-pay | Admitting: Internal Medicine

## 2017-05-04 NOTE — Telephone Encounter (Signed)
lmom tcb x1 pt was returning calling for xray results  Notes recorded by Deneise Lever, MD on 05/04/2017 at 8:56 AM EDT CXR- heart size normal, lungs are clear, no active process

## 2017-05-05 NOTE — Assessment & Plan Note (Signed)
Considered benign and stable as of CT 10/01/13. We will get occasional CXR with CT if indicated.

## 2017-05-05 NOTE — Assessment & Plan Note (Signed)
Currently stable moderately severe COPD with mixed emphysema and bronchitis components. There is always a little cough and sputum, some wheeze if he doesn't use his inhalers, but he feels well controlled.

## 2017-05-07 NOTE — Telephone Encounter (Signed)
Spoke with pt and informed him of the results per CY. Pt understood and had no further questions. Nothing further is needed at this time.

## 2017-05-21 NOTE — Anesthesia Postprocedure Evaluation (Signed)
Anesthesia Post Note  Patient: Kent Flowers [facility]  Procedure(s) Performed: Procedure(s) (LRB): CYSTOSCOPY WITH BALLOON URETHRALSTRICTURE DILATATION (N/A) POSSIBLE BLADDER  BIOPSY (N/A)     Anesthesia Post Evaluation  Last Vitals:  Vitals:   04/10/17 1330 04/10/17 1428  BP: 132/88 117/76  Pulse: 66 64  Resp: 11 15  Temp:  36.7 C    Last Pain:  Vitals:   04/11/17 1216  TempSrc:   PainSc: 0-No pain                 Niva Murren S

## 2017-05-21 NOTE — Addendum Note (Signed)
Addendum  created 05/21/17 1342 by Modelle Vollmer, MD   Sign clinical note    

## 2017-06-07 DIAGNOSIS — M5136 Other intervertebral disc degeneration, lumbar region: Secondary | ICD-10-CM | POA: Diagnosis not present

## 2017-06-07 DIAGNOSIS — M5432 Sciatica, left side: Secondary | ICD-10-CM | POA: Diagnosis not present

## 2017-06-07 DIAGNOSIS — M9903 Segmental and somatic dysfunction of lumbar region: Secondary | ICD-10-CM | POA: Diagnosis not present

## 2017-06-07 DIAGNOSIS — M9901 Segmental and somatic dysfunction of cervical region: Secondary | ICD-10-CM | POA: Diagnosis not present

## 2017-06-27 ENCOUNTER — Other Ambulatory Visit: Payer: Self-pay | Admitting: *Deleted

## 2017-06-27 MED ORDER — ALBUTEROL SULFATE HFA 108 (90 BASE) MCG/ACT IN AERS: INHALATION_SPRAY | RESPIRATORY_TRACT | 12 refills | Status: DC

## 2017-06-28 DIAGNOSIS — M9901 Segmental and somatic dysfunction of cervical region: Secondary | ICD-10-CM | POA: Diagnosis not present

## 2017-06-28 DIAGNOSIS — M9903 Segmental and somatic dysfunction of lumbar region: Secondary | ICD-10-CM | POA: Diagnosis not present

## 2017-06-28 DIAGNOSIS — M5432 Sciatica, left side: Secondary | ICD-10-CM | POA: Diagnosis not present

## 2017-06-28 DIAGNOSIS — M5136 Other intervertebral disc degeneration, lumbar region: Secondary | ICD-10-CM | POA: Diagnosis not present

## 2017-07-13 DIAGNOSIS — N35011 Post-traumatic bulbous urethral stricture: Secondary | ICD-10-CM | POA: Diagnosis not present

## 2017-07-13 DIAGNOSIS — E291 Testicular hypofunction: Secondary | ICD-10-CM | POA: Diagnosis not present

## 2017-07-25 DIAGNOSIS — M5136 Other intervertebral disc degeneration, lumbar region: Secondary | ICD-10-CM | POA: Diagnosis not present

## 2017-07-25 DIAGNOSIS — M9903 Segmental and somatic dysfunction of lumbar region: Secondary | ICD-10-CM | POA: Diagnosis not present

## 2017-07-25 DIAGNOSIS — M9901 Segmental and somatic dysfunction of cervical region: Secondary | ICD-10-CM | POA: Diagnosis not present

## 2017-07-25 DIAGNOSIS — M5432 Sciatica, left side: Secondary | ICD-10-CM | POA: Diagnosis not present

## 2017-08-01 DIAGNOSIS — L820 Inflamed seborrheic keratosis: Secondary | ICD-10-CM | POA: Diagnosis not present

## 2017-08-01 DIAGNOSIS — L720 Epidermal cyst: Secondary | ICD-10-CM | POA: Diagnosis not present

## 2017-08-01 DIAGNOSIS — L570 Actinic keratosis: Secondary | ICD-10-CM | POA: Diagnosis not present

## 2017-08-29 DIAGNOSIS — M9901 Segmental and somatic dysfunction of cervical region: Secondary | ICD-10-CM | POA: Diagnosis not present

## 2017-08-29 DIAGNOSIS — M5432 Sciatica, left side: Secondary | ICD-10-CM | POA: Diagnosis not present

## 2017-08-29 DIAGNOSIS — M9903 Segmental and somatic dysfunction of lumbar region: Secondary | ICD-10-CM | POA: Diagnosis not present

## 2017-08-29 DIAGNOSIS — M5136 Other intervertebral disc degeneration, lumbar region: Secondary | ICD-10-CM | POA: Diagnosis not present

## 2017-10-10 DIAGNOSIS — M5136 Other intervertebral disc degeneration, lumbar region: Secondary | ICD-10-CM | POA: Diagnosis not present

## 2017-10-10 DIAGNOSIS — M9903 Segmental and somatic dysfunction of lumbar region: Secondary | ICD-10-CM | POA: Diagnosis not present

## 2017-10-10 DIAGNOSIS — M9901 Segmental and somatic dysfunction of cervical region: Secondary | ICD-10-CM | POA: Diagnosis not present

## 2017-10-10 DIAGNOSIS — M5432 Sciatica, left side: Secondary | ICD-10-CM | POA: Diagnosis not present

## 2017-10-17 DIAGNOSIS — M9901 Segmental and somatic dysfunction of cervical region: Secondary | ICD-10-CM | POA: Diagnosis not present

## 2017-10-17 DIAGNOSIS — M9903 Segmental and somatic dysfunction of lumbar region: Secondary | ICD-10-CM | POA: Diagnosis not present

## 2017-10-17 DIAGNOSIS — M5136 Other intervertebral disc degeneration, lumbar region: Secondary | ICD-10-CM | POA: Diagnosis not present

## 2017-10-17 DIAGNOSIS — M5432 Sciatica, left side: Secondary | ICD-10-CM | POA: Diagnosis not present

## 2017-11-01 ENCOUNTER — Ambulatory Visit (INDEPENDENT_AMBULATORY_CARE_PROVIDER_SITE_OTHER): Payer: Medicare Other | Admitting: Family Medicine

## 2017-11-01 ENCOUNTER — Encounter: Payer: Self-pay | Admitting: Family Medicine

## 2017-11-01 DIAGNOSIS — M542 Cervicalgia: Secondary | ICD-10-CM

## 2017-11-01 DIAGNOSIS — Z659 Problem related to unspecified psychosocial circumstances: Secondary | ICD-10-CM | POA: Diagnosis not present

## 2017-11-01 MED ORDER — ALPRAZOLAM 0.25 MG PO TABS
0.2500 mg | ORAL_TABLET | Freq: Two times a day (BID) | ORAL | 0 refills | Status: DC | PRN
Start: 2017-11-01 — End: 2018-05-09
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
## 2017-11-01 NOTE — Patient Instructions (Addendum)
I don't feel or see a mass but the site of your symptoms is at the occipital ridge, which could have gotten irritated. If you get better, you don't have to do anything else.  If you need the xanax with any regularity, then you'll need to get another med instead. Continue to use the hydroxyzine if you can get by with that.   Take care.  Glad to see you.  Update me as needed.

## 2017-11-04 DIAGNOSIS — M542 Cervicalgia: Secondary | ICD-10-CM | POA: Insufficient documentation

## 2017-11-04 NOTE — Assessment & Plan Note (Signed)
I don't feel or see a mass but the site of his symptoms is at the occipital ridge, which could have gotten irritated. If he gets better, he doesn't have to do anything else.  If more pain, then update me.  He agrees.

## 2017-11-04 NOTE — Assessment & Plan Note (Signed)
If needing xanax with any regularity, then he'll need to get another med instead. D/w pt in detail.  This is a short term rx.  Continue to use the hydroxyzine if he can get by with that.   Update me as needed.  No SI/HI.  Detailed conversation about risk and benefit with benzodiazepines, including storage. >25 minutes spent in face to face time with patient, >50% spent in counselling or coordination of care.

## 2017-12-27 ENCOUNTER — Other Ambulatory Visit: Payer: Self-pay | Admitting: Internal Medicine

## 2018-02-04 DIAGNOSIS — M170 Bilateral primary osteoarthritis of knee: Secondary | ICD-10-CM | POA: Diagnosis not present

## 2018-02-28 DIAGNOSIS — H6122 Impacted cerumen, left ear: Secondary | ICD-10-CM | POA: Diagnosis not present

## 2018-02-28 DIAGNOSIS — H903 Sensorineural hearing loss, bilateral: Secondary | ICD-10-CM | POA: Diagnosis not present

## 2018-02-28 DIAGNOSIS — H6063 Unspecified chronic otitis externa, bilateral: Secondary | ICD-10-CM | POA: Diagnosis not present

## 2018-02-28 DIAGNOSIS — H8143 Vertigo of central origin, bilateral: Secondary | ICD-10-CM | POA: Diagnosis not present

## 2018-03-07 DIAGNOSIS — H6122 Impacted cerumen, left ear: Secondary | ICD-10-CM | POA: Diagnosis not present

## 2018-05-09 ENCOUNTER — Ambulatory Visit: Payer: Medicare Other | Admitting: Internal Medicine

## 2018-05-09 ENCOUNTER — Encounter: Payer: Self-pay | Admitting: Internal Medicine

## 2018-05-09 VITALS — BP 120/68 | HR 75 | Ht 68.0 in | Wt 179.4 lb

## 2018-05-09 DIAGNOSIS — J449 Chronic obstructive pulmonary disease, unspecified: Secondary | ICD-10-CM

## 2018-05-09 DIAGNOSIS — Z23 Encounter for immunization: Secondary | ICD-10-CM | POA: Diagnosis not present

## 2018-05-09 MED ORDER — ALBUTEROL SULFATE HFA 108 (90 BASE) MCG/ACT IN AERS: 2.0000 | INHALATION_SPRAY | Freq: Four times a day (QID) | RESPIRATORY_TRACT | 3 refills | Status: DC | PRN

## 2018-05-09 MED ORDER — UMECLIDINIUM-VILANTEROL 62.5-25 MCG/INH IN AEPB: INHALATION_SPRAY | RESPIRATORY_TRACT | 3 refills | Status: DC

## 2018-05-09 NOTE — Progress Notes (Signed)
HPI male former smoker followed for COPD, history lung nodules, complicated by osteoarthritis, ascending aortic aneurysm Spirometry 01/24/12- FVC 2.24/ 64%, FEV1 1.20/ 42%, FEV1/FVC 0.53 PFT: 03/19/2012-moderate obstructive airways disease, insignificant response to bronchodilator , air trapping,normal diffusion capacity. Loop contour suggests emphysema. FVC 3.73/91%, FEV1 2.07/73%, FEV1/FVC 0.56. TLC 114%, RV 138% , DLCO 90% CT chest 10/01/13- Visualized small lung nodules are benign based on current consensus criteria  Office Spirometry 05/03/17-moderately severe obstruction. FVC 3.25/79%, FEV1 1.77/58%, ratio 0.54, FEF 25-75% 0.88/38%  ------------------------------------------------------------------------------------------------------------------------------ 05/03/17- 72 year old male former smoker followed for COPD, history lung nodules FOLLOW UP FOR yearly follow up for COPD  No major acute events this winter. It helped to add Anoro.. Uses rescue inhaler each morning and sometimes at bedtime as routine. Complains again the pain in his knees slows him down. CXR 05/12/16 IMPRESSION: No acute cardiopulmonary disease. Peribronchial thickening which may relate to chronic bronchitis or Smoking. Office Spirometry 05/03/17-moderately severe obstruction. FVC 3.25/79%, FEV1 1.77/58%, ratio 0.54, FEF 25-75% 0.88/38%  05/09/2018- 73 year old male former smoker followed for COPD, history lung nodules  ----COPD mixed type: Pt states he has cough each morning-no color; denies any wheezing,congestion, or SOB at this time.  Pt would like to have Venotlin HFA sent to pharmacy instead of Proair HFA-states he can tell a bigger difference. He denies significant exacerbations or major change.  Morning cough with scant white sputum.  No chest pain, adenopathy, fever or blood.  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  Requests that his metered  inhaler be Ventolin brand for better aerosol flow. Discussed pneumonia vaccine CXR 05/03/2017 The heart size and mediastinal contours are within normal limits. Both lungs are clear. The visualized skeletal structures are unremarkable. No active cardiopulmonary disease.  ROS-see HPI      + = pos Constitutional:   No-   weight loss, night sweats,  chills, fatigue, lassitude. HEENT:   No-  headaches, difficulty swallowing, tooth/dental problems,        No-  sneezing, itching, ear ache,  +nasal congestion, post nasal drip,  CV:  No- chest pain, orthopnea, PND, swelling in lower extremities, anasarca,  dizziness, palpitations Resp: +   shortness of breath with exertion or at rest.              +productive cough, no- non-productive cough,  No- coughing up of blood.             No change in color of mucus.  + wheezing.   Skin: No-   rash or lesions. GI:  No-   heartburn, indigestion, abdominal pain, nausea, vomiting GU: No-   dysuria,. MS:  +  joint pain or swelling.     Neuro-     nothing unusual Psych:  No- change in mood or affect. No depression or anxiety.  No memory loss.  OBJ- Physical Exam General- Alert, Oriented, Affect-appropriate, Distress- none acute Skin- rash-none, lesions- none, excoriation- none Lymphadenopathy- none Head- atraumatic            Eyes- Gross vision intact, PERRLA, conjunctivae and secretions clear            Ears- Hearing,             Nose- Clear, no-Septal dev, mucus, polyps, erosion, perforation             Throat- Mallampati II , mucosa clear , drainage- none, tonsils- atrophic Neck- flexible , trachea midline, no  stridor , thyroid nl, carotid no bruit Chest - symmetrical excursion , unlabored           Heart/CV- RRR , no murmur , no gallop  , no rub, nl s1 s2                           - JVD- none , edema- none, stasis changes- none, varices- none           Lung- + diminished, wheeze-none, cough- none , dullness-none, rub- none           Chest wall-   Abd-  Br/ Gen/ Rectal- Not done, not indicated Extrem- cyanosis- none, clubbing, none, atrophy- none, strength- nl Neuro- grossly intact to observation

## 2018-05-09 NOTE — Patient Instructions (Signed)
Script sent refilling Anoro  Script sent for Ventolin brand instead of Proair  Order- Prevnar 13 pneumonia vaccine  Please call if we can help

## 2018-05-10 DIAGNOSIS — D485 Neoplasm of uncertain behavior of skin: Secondary | ICD-10-CM | POA: Diagnosis not present

## 2018-05-10 DIAGNOSIS — L309 Dermatitis, unspecified: Secondary | ICD-10-CM | POA: Diagnosis not present

## 2018-05-10 DIAGNOSIS — L280 Lichen simplex chronicus: Secondary | ICD-10-CM | POA: Diagnosis not present

## 2018-05-10 NOTE — Assessment & Plan Note (Signed)
No exacerbations.  Natural course of his disease is progression but he has adapted appropriately.  He still looks fit. Plan-refill on Anoro, refill Ventolin HFA, Prevnar 13 pneumonia vaccine

## 2018-06-04 DIAGNOSIS — H6121 Impacted cerumen, right ear: Secondary | ICD-10-CM | POA: Diagnosis not present

## 2018-06-04 DIAGNOSIS — H6061 Unspecified chronic otitis externa, right ear: Secondary | ICD-10-CM | POA: Diagnosis not present

## 2018-07-04 DIAGNOSIS — H6121 Impacted cerumen, right ear: Secondary | ICD-10-CM | POA: Diagnosis not present

## 2018-07-04 DIAGNOSIS — H6061 Unspecified chronic otitis externa, right ear: Secondary | ICD-10-CM | POA: Diagnosis not present

## 2018-07-18 DIAGNOSIS — H6122 Impacted cerumen, left ear: Secondary | ICD-10-CM | POA: Diagnosis not present

## 2018-10-23 DIAGNOSIS — S43401A Unspecified sprain of right shoulder joint, initial encounter: Secondary | ICD-10-CM | POA: Diagnosis not present

## 2018-11-04 DIAGNOSIS — H6062 Unspecified chronic otitis externa, left ear: Secondary | ICD-10-CM | POA: Diagnosis not present

## 2018-11-04 DIAGNOSIS — H6122 Impacted cerumen, left ear: Secondary | ICD-10-CM | POA: Diagnosis not present

## 2018-11-04 DIAGNOSIS — H6521 Chronic serous otitis media, right ear: Secondary | ICD-10-CM | POA: Diagnosis not present

## 2018-11-11 DIAGNOSIS — H6122 Impacted cerumen, left ear: Secondary | ICD-10-CM | POA: Diagnosis not present

## 2018-11-11 DIAGNOSIS — H903 Sensorineural hearing loss, bilateral: Secondary | ICD-10-CM | POA: Diagnosis not present

## 2018-11-20 ENCOUNTER — Other Ambulatory Visit: Payer: Self-pay | Admitting: Internal Medicine

## 2019-01-13 DIAGNOSIS — S43401D Unspecified sprain of right shoulder joint, subsequent encounter: Secondary | ICD-10-CM | POA: Diagnosis not present

## 2019-01-14 DIAGNOSIS — H6121 Impacted cerumen, right ear: Secondary | ICD-10-CM | POA: Diagnosis not present

## 2019-01-14 DIAGNOSIS — H6061 Unspecified chronic otitis externa, right ear: Secondary | ICD-10-CM | POA: Diagnosis not present

## 2019-01-18 DIAGNOSIS — M25511 Pain in right shoulder: Secondary | ICD-10-CM | POA: Diagnosis not present

## 2019-01-21 DIAGNOSIS — M545 Low back pain: Secondary | ICD-10-CM | POA: Diagnosis not present

## 2019-01-27 DIAGNOSIS — S46011A Strain of muscle(s) and tendon(s) of the rotator cuff of right shoulder, initial encounter: Secondary | ICD-10-CM | POA: Diagnosis not present

## 2019-01-27 DIAGNOSIS — M48061 Spinal stenosis, lumbar region without neurogenic claudication: Secondary | ICD-10-CM | POA: Diagnosis not present

## 2019-02-06 DIAGNOSIS — H5203 Hypermetropia, bilateral: Secondary | ICD-10-CM | POA: Diagnosis not present

## 2019-02-06 DIAGNOSIS — H524 Presbyopia: Secondary | ICD-10-CM | POA: Diagnosis not present

## 2019-02-06 DIAGNOSIS — H5000 Unspecified esotropia: Secondary | ICD-10-CM | POA: Diagnosis not present

## 2019-02-06 DIAGNOSIS — H52203 Unspecified astigmatism, bilateral: Secondary | ICD-10-CM | POA: Diagnosis not present

## 2019-02-12 DIAGNOSIS — H6122 Impacted cerumen, left ear: Secondary | ICD-10-CM | POA: Diagnosis not present

## 2019-03-03 DIAGNOSIS — M25562 Pain in left knee: Secondary | ICD-10-CM | POA: Diagnosis not present

## 2019-03-03 DIAGNOSIS — M25561 Pain in right knee: Secondary | ICD-10-CM | POA: Diagnosis not present

## 2019-03-05 DIAGNOSIS — M1712 Unilateral primary osteoarthritis, left knee: Secondary | ICD-10-CM | POA: Diagnosis not present

## 2019-03-05 DIAGNOSIS — G894 Chronic pain syndrome: Secondary | ICD-10-CM | POA: Diagnosis not present

## 2019-03-05 DIAGNOSIS — M1711 Unilateral primary osteoarthritis, right knee: Secondary | ICD-10-CM | POA: Diagnosis not present

## 2019-03-05 DIAGNOSIS — M48062 Spinal stenosis, lumbar region with neurogenic claudication: Secondary | ICD-10-CM | POA: Diagnosis not present

## 2019-03-05 DIAGNOSIS — M792 Neuralgia and neuritis, unspecified: Secondary | ICD-10-CM | POA: Diagnosis not present

## 2019-03-05 DIAGNOSIS — M4807 Spinal stenosis, lumbosacral region: Secondary | ICD-10-CM | POA: Diagnosis not present

## 2019-03-05 DIAGNOSIS — M19011 Primary osteoarthritis, right shoulder: Secondary | ICD-10-CM | POA: Diagnosis not present

## 2019-03-05 DIAGNOSIS — M47897 Other spondylosis, lumbosacral region: Secondary | ICD-10-CM | POA: Diagnosis not present

## 2019-03-05 DIAGNOSIS — M5416 Radiculopathy, lumbar region: Secondary | ICD-10-CM | POA: Diagnosis not present

## 2019-03-05 DIAGNOSIS — G8929 Other chronic pain: Secondary | ICD-10-CM | POA: Diagnosis not present

## 2019-03-18 DIAGNOSIS — G894 Chronic pain syndrome: Secondary | ICD-10-CM | POA: Diagnosis not present

## 2019-03-18 DIAGNOSIS — M1711 Unilateral primary osteoarthritis, right knee: Secondary | ICD-10-CM | POA: Diagnosis not present

## 2019-03-18 DIAGNOSIS — M1712 Unilateral primary osteoarthritis, left knee: Secondary | ICD-10-CM | POA: Diagnosis not present

## 2019-03-18 DIAGNOSIS — M19011 Primary osteoarthritis, right shoulder: Secondary | ICD-10-CM | POA: Diagnosis not present

## 2019-03-31 ENCOUNTER — Other Ambulatory Visit: Payer: Self-pay | Admitting: Internal Medicine

## 2019-04-03 DIAGNOSIS — M1712 Unilateral primary osteoarthritis, left knee: Secondary | ICD-10-CM | POA: Diagnosis not present

## 2019-04-03 DIAGNOSIS — M1711 Unilateral primary osteoarthritis, right knee: Secondary | ICD-10-CM | POA: Diagnosis not present

## 2019-04-07 ENCOUNTER — Telehealth: Payer: Self-pay | Admitting: Internal Medicine

## 2019-04-07 MED ORDER — ALBUTEROL SULFATE HFA 108 (90 BASE) MCG/ACT IN AERS: INHALATION_SPRAY | RESPIRATORY_TRACT | 2 refills | Status: DC

## 2019-04-07 NOTE — Telephone Encounter (Signed)
Returned call to patient. He says his refill has been denied. Per last ov Ventolin rx was sent instead of ProAir. Pt says his insurance will not cover Ventolin. Called pharmacy who verified denial. Gave authorization with 2 additional refills.

## 2019-04-11 ENCOUNTER — Telehealth: Payer: Self-pay

## 2019-04-11 NOTE — Telephone Encounter (Signed)
Spoke to patient and advised per Dr. Damita Dunnings orthopedic office notes received and patient is scheduled to have knee surgery at the end of July or maybe early August. Patient is aware to call us to schedule surgical clearance once they set up date for sure.  Patient wanted to know if he could switch back to Dr. Lorelei Pont. Looks like originally he switched to Dr. Damita Dunnings and the note said due to availability. Advised patient I would check and someone would call him back to let him know if that is ok. Placing orthopedic note on Dr. Lillie Fragmin desk just in case.

## 2019-04-13 NOTE — Telephone Encounter (Signed)
I have not taken primary care transfers of care or new patients for years and indefinitely given my subspecialty training in Sports Medicine.  Cc: Dr. Damita Dunnings

## 2019-04-13 NOTE — Telephone Encounter (Signed)
Dr. Loletha Grayer understandably declined the transfer.  Patient can either follow up with me or another doc at another clinic.  I'm still glad to see him.  I don't know why he wanted to transfer out from me.

## 2019-04-14 NOTE — Telephone Encounter (Signed)
Left detailed message on VM per DPR. 

## 2019-04-15 DIAGNOSIS — G894 Chronic pain syndrome: Secondary | ICD-10-CM | POA: Diagnosis not present

## 2019-04-15 DIAGNOSIS — M19011 Primary osteoarthritis, right shoulder: Secondary | ICD-10-CM | POA: Diagnosis not present

## 2019-04-15 DIAGNOSIS — M1711 Unilateral primary osteoarthritis, right knee: Secondary | ICD-10-CM | POA: Diagnosis not present

## 2019-04-15 DIAGNOSIS — M1712 Unilateral primary osteoarthritis, left knee: Secondary | ICD-10-CM | POA: Diagnosis not present

## 2019-04-22 DIAGNOSIS — M19011 Primary osteoarthritis, right shoulder: Secondary | ICD-10-CM | POA: Diagnosis not present

## 2019-04-22 DIAGNOSIS — G894 Chronic pain syndrome: Secondary | ICD-10-CM | POA: Diagnosis not present

## 2019-04-22 DIAGNOSIS — M1711 Unilateral primary osteoarthritis, right knee: Secondary | ICD-10-CM | POA: Diagnosis not present

## 2019-04-22 DIAGNOSIS — M1712 Unilateral primary osteoarthritis, left knee: Secondary | ICD-10-CM | POA: Diagnosis not present

## 2019-05-15 ENCOUNTER — Ambulatory Visit: Payer: Medicare Other | Admitting: Internal Medicine

## 2019-05-19 DIAGNOSIS — L821 Other seborrheic keratosis: Secondary | ICD-10-CM | POA: Diagnosis not present

## 2019-05-19 DIAGNOSIS — L570 Actinic keratosis: Secondary | ICD-10-CM | POA: Diagnosis not present

## 2019-05-19 DIAGNOSIS — D225 Melanocytic nevi of trunk: Secondary | ICD-10-CM | POA: Diagnosis not present

## 2019-05-20 DIAGNOSIS — M792 Neuralgia and neuritis, unspecified: Secondary | ICD-10-CM | POA: Diagnosis not present

## 2019-05-20 DIAGNOSIS — M1712 Unilateral primary osteoarthritis, left knee: Secondary | ICD-10-CM | POA: Diagnosis not present

## 2019-05-20 DIAGNOSIS — M1711 Unilateral primary osteoarthritis, right knee: Secondary | ICD-10-CM | POA: Diagnosis not present

## 2019-05-20 DIAGNOSIS — M19011 Primary osteoarthritis, right shoulder: Secondary | ICD-10-CM | POA: Diagnosis not present

## 2019-05-20 DIAGNOSIS — G894 Chronic pain syndrome: Secondary | ICD-10-CM | POA: Diagnosis not present

## 2019-05-20 DIAGNOSIS — M47897 Other spondylosis, lumbosacral region: Secondary | ICD-10-CM | POA: Diagnosis not present

## 2019-05-20 DIAGNOSIS — G8929 Other chronic pain: Secondary | ICD-10-CM | POA: Diagnosis not present

## 2019-05-20 DIAGNOSIS — M48062 Spinal stenosis, lumbar region with neurogenic claudication: Secondary | ICD-10-CM | POA: Diagnosis not present

## 2019-05-20 DIAGNOSIS — M4807 Spinal stenosis, lumbosacral region: Secondary | ICD-10-CM | POA: Diagnosis not present

## 2019-06-10 ENCOUNTER — Other Ambulatory Visit: Payer: Self-pay | Admitting: Orthopedic Surgery

## 2019-06-17 DIAGNOSIS — M1711 Unilateral primary osteoarthritis, right knee: Secondary | ICD-10-CM | POA: Diagnosis not present

## 2019-06-17 DIAGNOSIS — G894 Chronic pain syndrome: Secondary | ICD-10-CM | POA: Diagnosis not present

## 2019-06-17 DIAGNOSIS — M19011 Primary osteoarthritis, right shoulder: Secondary | ICD-10-CM | POA: Diagnosis not present

## 2019-06-17 DIAGNOSIS — M1712 Unilateral primary osteoarthritis, left knee: Secondary | ICD-10-CM | POA: Diagnosis not present

## 2019-07-15 ENCOUNTER — Other Ambulatory Visit: Payer: Self-pay | Admitting: Internal Medicine

## 2019-07-15 DIAGNOSIS — M1712 Unilateral primary osteoarthritis, left knee: Secondary | ICD-10-CM | POA: Diagnosis not present

## 2019-07-15 DIAGNOSIS — G894 Chronic pain syndrome: Secondary | ICD-10-CM | POA: Diagnosis not present

## 2019-07-15 DIAGNOSIS — M19011 Primary osteoarthritis, right shoulder: Secondary | ICD-10-CM | POA: Diagnosis not present

## 2019-07-15 DIAGNOSIS — M1711 Unilateral primary osteoarthritis, right knee: Secondary | ICD-10-CM | POA: Diagnosis not present

## 2019-07-18 ENCOUNTER — Telehealth: Payer: Self-pay | Admitting: Internal Medicine

## 2019-07-18 NOTE — Telephone Encounter (Signed)
We got a pre-op clearance request from [facility] for TKR so I called Mr Kent Flowers to update status. He plans to see [place] in Sept, before that surgery, so we will wait and respond to his Ortho office then. He indicates he feels stable.

## 2019-08-12 DIAGNOSIS — M19011 Primary osteoarthritis, right shoulder: Secondary | ICD-10-CM | POA: Diagnosis not present

## 2019-08-12 DIAGNOSIS — G894 Chronic pain syndrome: Secondary | ICD-10-CM | POA: Diagnosis not present

## 2019-08-12 DIAGNOSIS — M1711 Unilateral primary osteoarthritis, right knee: Secondary | ICD-10-CM | POA: Diagnosis not present

## 2019-08-12 DIAGNOSIS — M1712 Unilateral primary osteoarthritis, left knee: Secondary | ICD-10-CM | POA: Diagnosis not present

## 2019-08-15 ENCOUNTER — Ambulatory Visit: Admit: 2019-08-15 | Payer: Medicare Other | Admitting: Orthopedic Surgery

## 2019-08-15 SURGERY — ARTHROPLASTY, KNEE, TOTAL
Anesthesia: Spinal | Laterality: Right

## 2019-08-18 DIAGNOSIS — H608X3 Other otitis externa, bilateral: Secondary | ICD-10-CM | POA: Diagnosis not present

## 2019-08-18 DIAGNOSIS — H6123 Impacted cerumen, bilateral: Secondary | ICD-10-CM | POA: Diagnosis not present

## 2019-09-11 ENCOUNTER — Ambulatory Visit: Payer: Medicare Other | Admitting: Internal Medicine

## 2019-09-17 DIAGNOSIS — R948 Abnormal results of function studies of other organs and systems: Secondary | ICD-10-CM | POA: Diagnosis not present

## 2019-09-17 DIAGNOSIS — R310 Gross hematuria: Secondary | ICD-10-CM | POA: Diagnosis not present

## 2019-09-22 ENCOUNTER — Ambulatory Visit (INDEPENDENT_AMBULATORY_CARE_PROVIDER_SITE_OTHER): Payer: Medicare Other

## 2019-09-22 ENCOUNTER — Other Ambulatory Visit: Payer: Self-pay

## 2019-09-22 ENCOUNTER — Ambulatory Visit: Payer: Medicare Other | Admitting: Internal Medicine

## 2019-09-22 ENCOUNTER — Encounter: Payer: Self-pay | Admitting: Internal Medicine

## 2019-09-22 VITALS — BP 130/80 | HR 81 | Temp 97.2°F | Ht 65.5 in | Wt 180.2 lb

## 2019-09-22 DIAGNOSIS — F5101 Primary insomnia: Secondary | ICD-10-CM | POA: Diagnosis not present

## 2019-09-22 DIAGNOSIS — R911 Solitary pulmonary nodule: Secondary | ICD-10-CM | POA: Diagnosis not present

## 2019-09-22 DIAGNOSIS — J449 Chronic obstructive pulmonary disease, unspecified: Secondary | ICD-10-CM | POA: Diagnosis not present

## 2019-09-22 DIAGNOSIS — G4700 Insomnia, unspecified: Secondary | ICD-10-CM | POA: Insufficient documentation

## 2019-09-22 IMAGING — DX DG CHEST 2V
2 series · 2 of 2 positions shown · non-contrast
Comparison: None.

CLINICAL DATA: COPD.

EXAM:
CHEST - 2 VIEW

[chest pa]
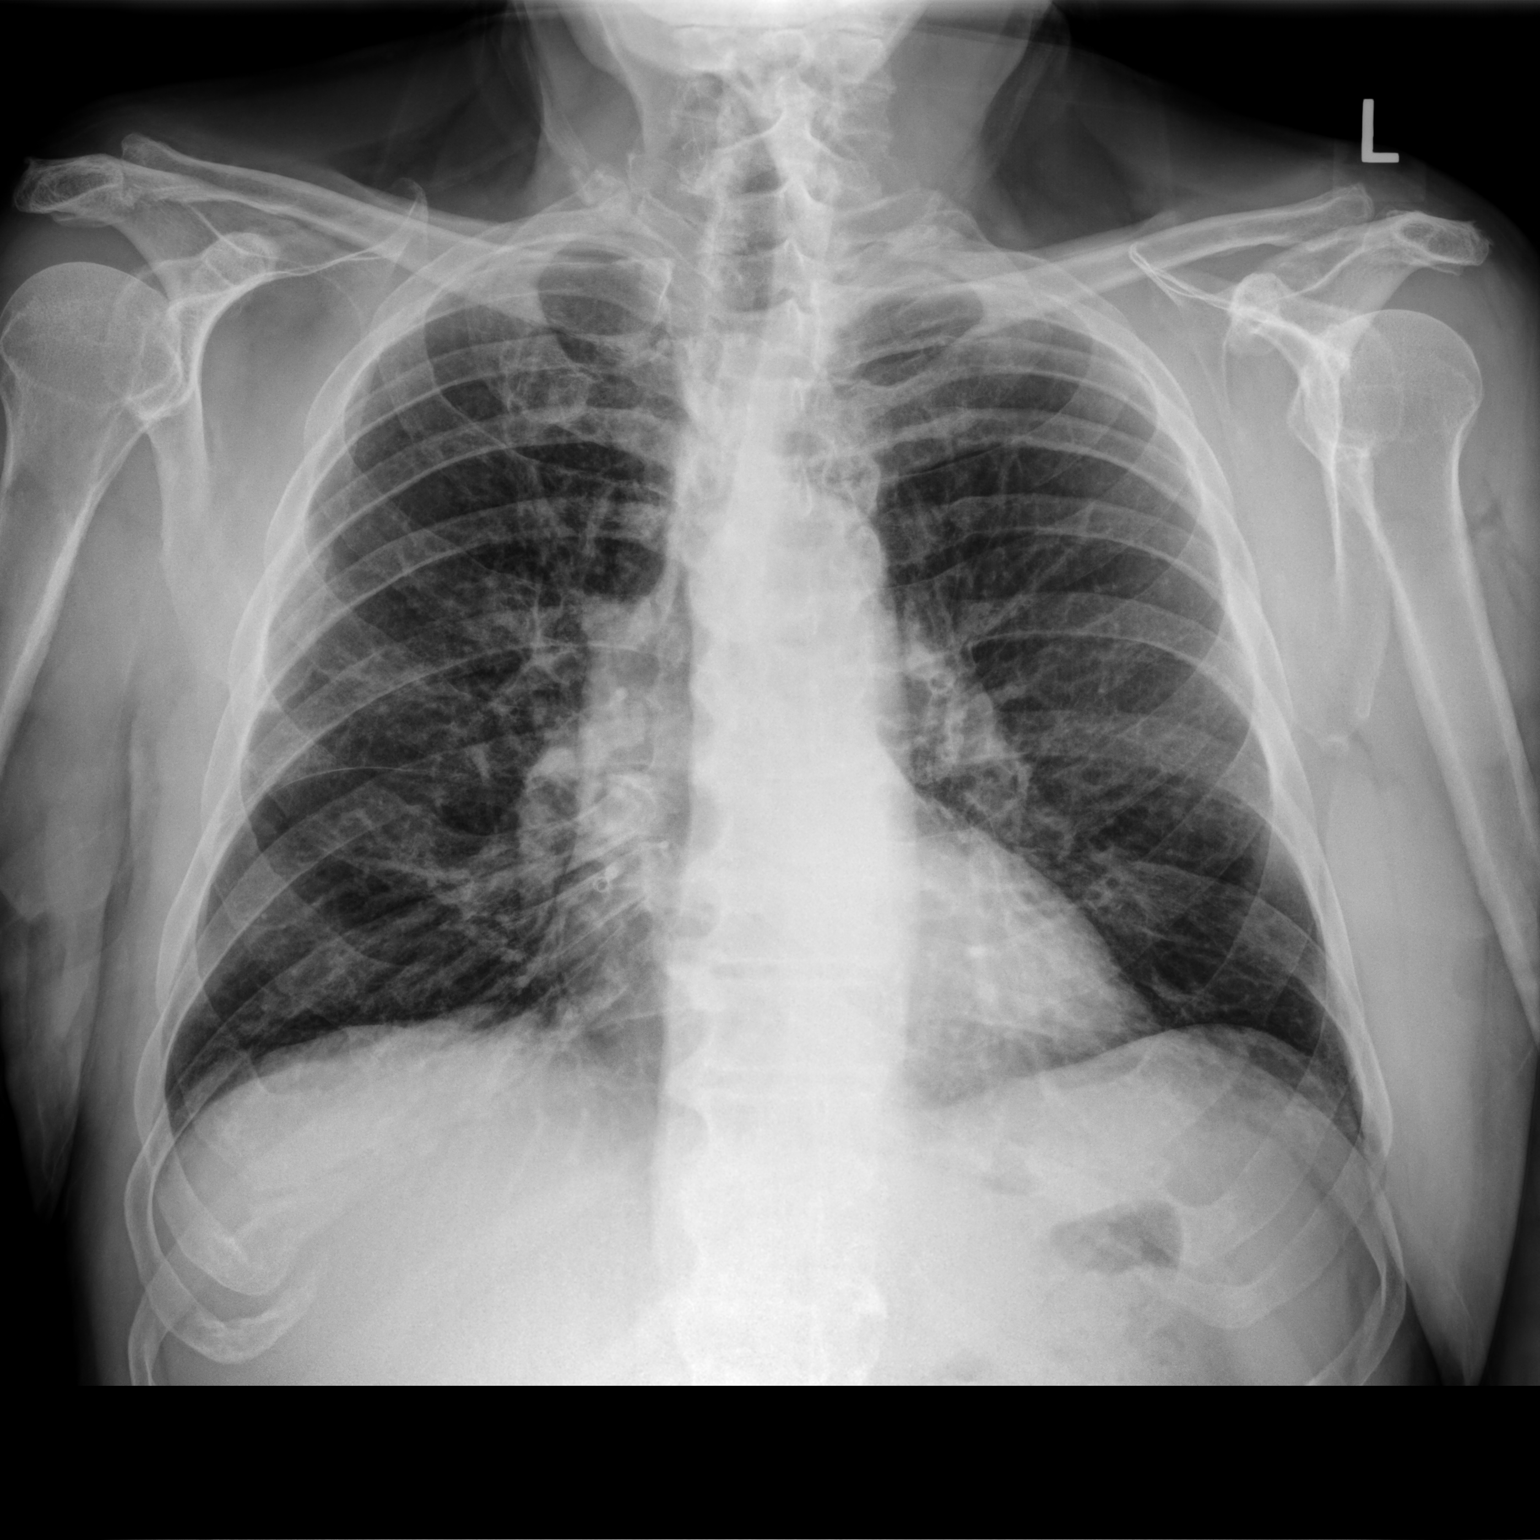

[chest lat]
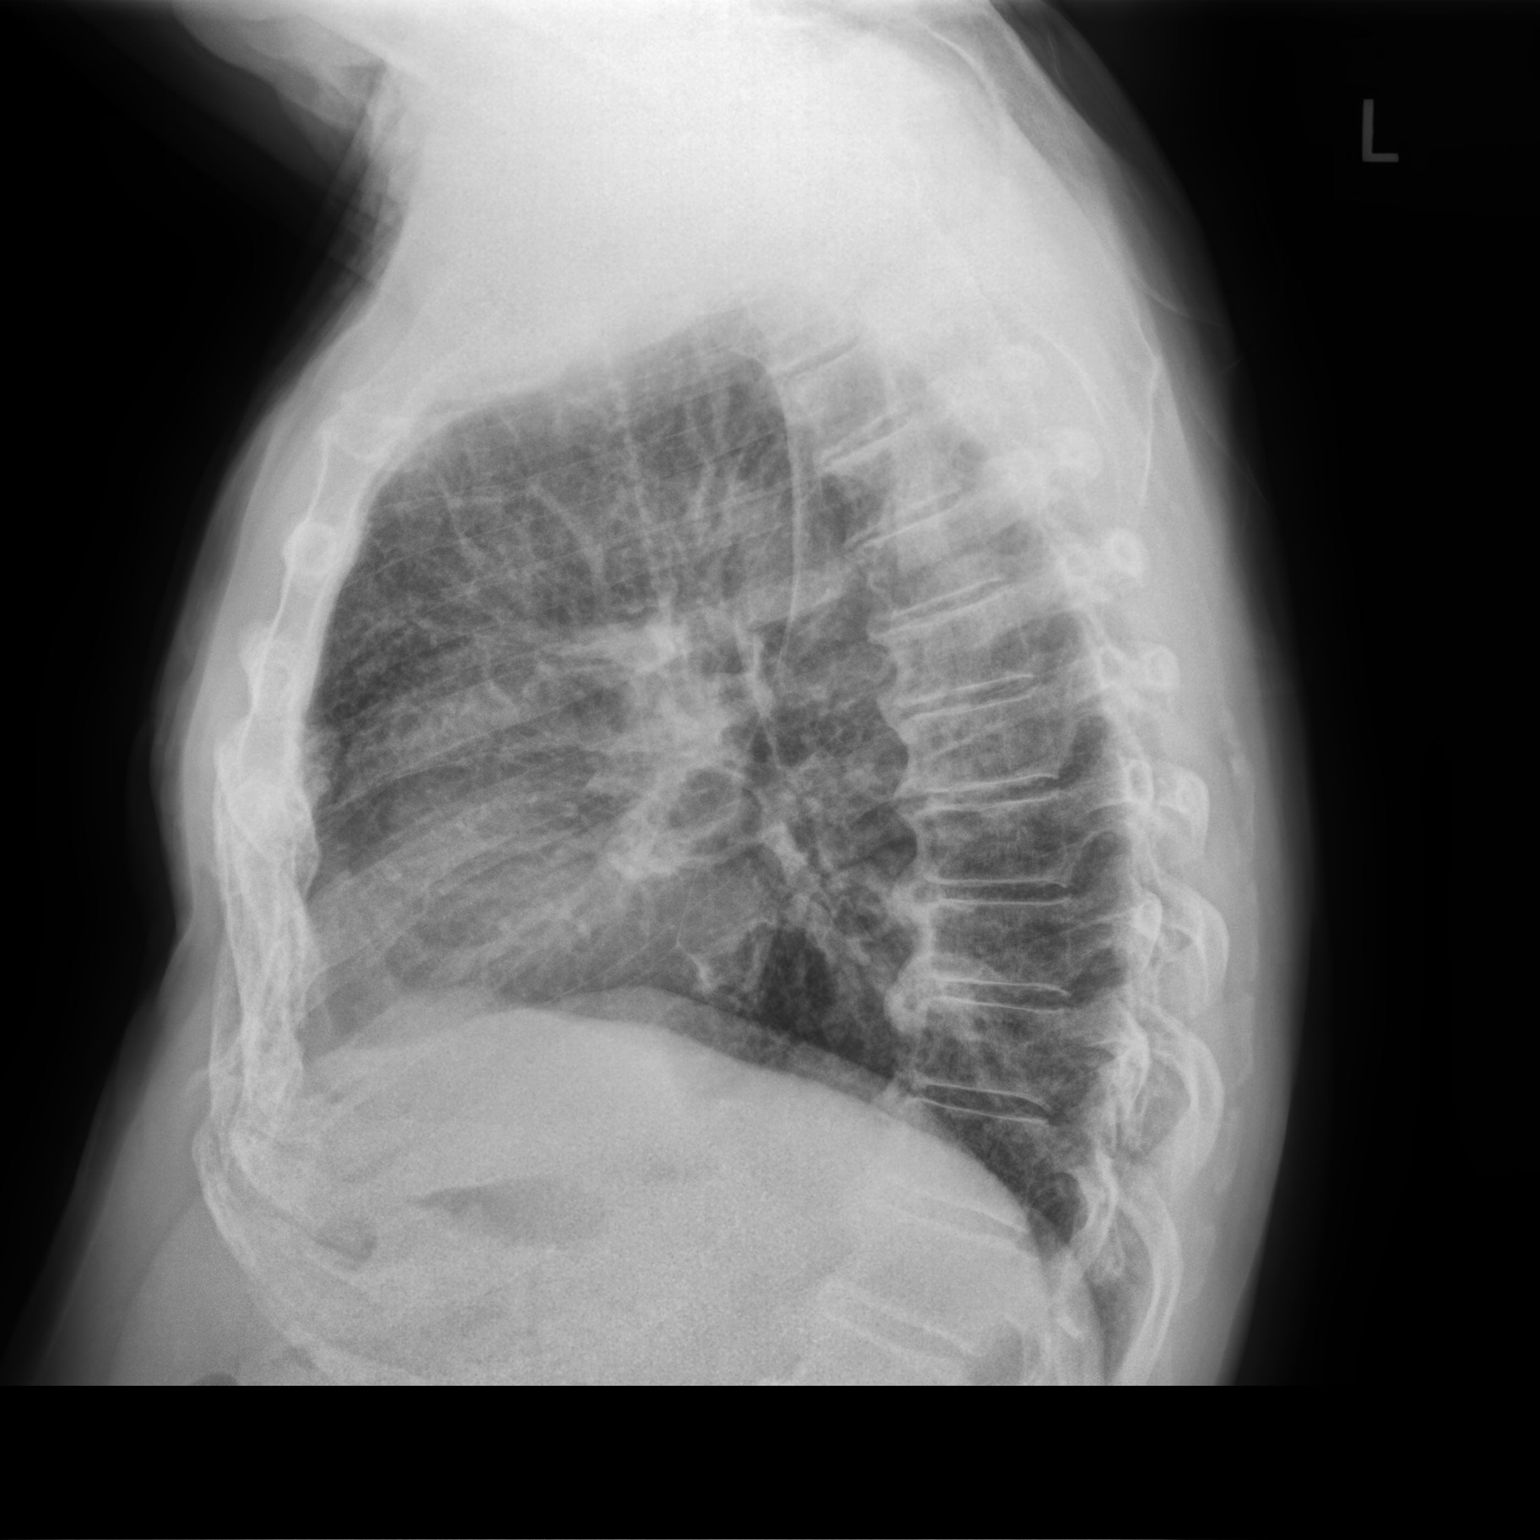

[2 of 2 positions shown; findings below may reference images not displayed]

FINDINGS: The heart size and mediastinal contours are within normal limits.
Both lungs are clear. The visualized skeletal structures are
unremarkable.
IMPRESSION: No active cardiopulmonary disease.

## 2019-09-22 MED ORDER — ALBUTEROL SULFATE HFA 108 (90 BASE) MCG/ACT IN AERS: INHALATION_SPRAY | RESPIRATORY_TRACT | 12 refills | Status: DC

## 2019-09-22 NOTE — Assessment & Plan Note (Addendum)
Primary insomnia and a little casual about good sleep habits/ regular bedtime- discussed Plan- melatonin

## 2019-09-22 NOTE — Assessment & Plan Note (Signed)
Low concern, but appropriate to update CXR

## 2019-09-22 NOTE — Patient Instructions (Addendum)
Order CXR    Dx COPD mixed type  Ok to see how you do off the Anoro  Albuterol inhaler refill e-sent  Ok add otc melatonin 5-10 mg, about 30-45 minutes before you intend to sleep  Please call if we can help

## 2019-09-22 NOTE — Progress Notes (Signed)
HPI male former smoker followed for COPD, history lung nodules, complicated by osteoarthritis, ascending aortic aneurysm Spirometry 01/24/12- FVC 2.24/ 64%, FEV1 1.20/ 42%, FEV1/FVC 0.53 PFT: 03/19/2012-moderate obstructive airways disease, insignificant response to bronchodilator , air trapping,normal diffusion capacity. Loop contour suggests emphysema. FVC 3.73/91%, FEV1 2.07/73%, FEV1/FVC 0.56. TLC 114%, RV 138% , DLCO 90% CT chest 10/01/13- Visualized small lung nodules are benign based on current consensus criteria  Office Spirometry 05/03/17-moderately severe obstruction. FVC 3.25/79%, FEV1 1.77/58%, ratio 0.54, FEF 25-75% 0.88/38%  -------------------------------------------------------------------------------------------------------------  05/09/2018- 73 year old male former smoker followed for COPD, history lung nodules  ----COPD mixed type: Pt states he has cough each morning-no color; denies any wheezing,congestion, or SOB at this time.  Pt would like to have Venotlin HFA sent to pharmacy instead of Proair HFA-states he can tell a bigger difference. He denies significant exacerbations or major change.  Morning cough with scant white sputum.  No chest pain, adenopathy, fever or blood.  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  Requests that his metered inhaler be Ventolin brand for better aerosol flow. Discussed pneumonia vaccine CXR 05/03/2017 The heart size and mediastinal contours are within normal limits. Both lungs are clear. The visualized skeletal structures are unremarkable. No active cardiopulmonary disease.  09/22/2019- 73 year old male former smoker followed for COPD, history lung nodules  -----pt states breathing is at baseline; pt states he intends to stop Anoro because he does not see benefit from it Anoro, albuterol hfa Uses rescue inhaler at bedtime, then on first waking to clear morning phlegm.  Otherwise feels comfortable. Stable DOE with significant physical effort only. Still working some doing Architect projects part time.  Pending TKR. New problem- Insomnia. Stays up till 1-2AM. Takes gabapentin, unisom. Watches TV. Discussed melatonin to reinforce regular bedtime schedule.  Declines flu vax.  ROS-see HPI      + = positive Constitutional:   No-   weight loss, night sweats,  chills, fatigue, lassitude. HEENT:   No-  headaches, difficulty swallowing, tooth/dental problems,        No-  sneezing, itching, ear ache,  +nasal congestion, post nasal drip,  CV:  No- chest pain, orthopnea, PND, swelling in lower extremities, anasarca,  dizziness, palpitations Resp: +   shortness of breath with exertion or at rest.              +productive cough, no- non-productive cough,  No- coughing up of blood.             No change in color of mucus.  + wheezing.   Skin: No-   rash or lesions. GI:  No-   heartburn, indigestion, abdominal pain, nausea, vomiting GU: No-   dysuria,. MS:  +  joint pain or swelling.     Neuro-     nothing unusual Psych:  No- change in mood or affect. No depression or anxiety.  No memory loss.  OBJ- Physical Exam General- Alert, Oriented, Affect-appropriate, Distress- none acute, fit-appearing Skin- rash-none, lesions- none, excoriation- none Lymphadenopathy- none Head- atraumatic            Eyes- Gross vision intact, PERRLA, conjunctivae and secretions clear            Ears- Hearing,             Nose- Clear, no-Septal dev, mucus, polyps, erosion, perforation             Throat- Mallampati II , mucosa  clear , drainage- none, tonsils- atrophic Neck- flexible , trachea midline, no stridor , thyroid nl, carotid no bruit Chest - symmetrical excursion , unlabored           Heart/CV- RRR , no murmur , no gallop  , no rub, nl s1 s2                           - JVD- none , edema- none, stasis changes- none, varices- none           Lung- + diminished/ clear, wheeze-none,  cough- none , dullness-none, rub- none           Chest wall-  Abd-  Br/ Gen/ Rectal- Not done, not indicated Extrem- cyanosis- none, clubbing, none, atrophy- none, strength- nl Neuro- grossly intact to observation

## 2019-09-22 NOTE — Assessment & Plan Note (Signed)
Not much bronchitis component currently. Probably normal progression of COPD. Plan- update CXR,  Ok to try off Anoro for comparison.

## 2019-09-23 ENCOUNTER — Other Ambulatory Visit: Payer: Self-pay | Admitting: Orthopedic Surgery

## 2019-09-26 ENCOUNTER — Telehealth: Payer: Self-pay | Admitting: Internal Medicine

## 2019-09-26 NOTE — Telephone Encounter (Signed)
Thank you. Surgical clearance form has been reviewed and signed by CY and has been faxed back to [facility] [phone] 09/26/2019 1548. Nothing further needed at this time.

## 2019-09-26 NOTE — Telephone Encounter (Signed)
Spoke with Kent Flowers and advised her to re-fax form again because I could not locate the surgical clearance form she faxed. She agreed and she will fax it to the triage printer. Will await fax.

## 2019-09-26 NOTE — Telephone Encounter (Signed)
I received the surgical clearance form and will place in CY box. Will route to [place].

## 2019-10-06 DIAGNOSIS — Z7901 Long term (current) use of anticoagulants: Secondary | ICD-10-CM | POA: Diagnosis not present

## 2019-10-06 DIAGNOSIS — E7800 Pure hypercholesterolemia, unspecified: Secondary | ICD-10-CM | POA: Diagnosis not present

## 2019-10-06 DIAGNOSIS — Z1159 Encounter for screening for other viral diseases: Secondary | ICD-10-CM | POA: Diagnosis not present

## 2019-10-06 DIAGNOSIS — G629 Polyneuropathy, unspecified: Secondary | ICD-10-CM | POA: Diagnosis not present

## 2019-10-06 DIAGNOSIS — Z01810 Encounter for preprocedural cardiovascular examination: Secondary | ICD-10-CM | POA: Diagnosis not present

## 2019-10-07 DIAGNOSIS — H6123 Impacted cerumen, bilateral: Secondary | ICD-10-CM | POA: Insufficient documentation

## 2019-10-07 DIAGNOSIS — M4807 Spinal stenosis, lumbosacral region: Secondary | ICD-10-CM | POA: Diagnosis not present

## 2019-10-07 DIAGNOSIS — G8929 Other chronic pain: Secondary | ICD-10-CM | POA: Diagnosis not present

## 2019-10-07 DIAGNOSIS — M792 Neuralgia and neuritis, unspecified: Secondary | ICD-10-CM | POA: Diagnosis not present

## 2019-10-07 DIAGNOSIS — M47897 Other spondylosis, lumbosacral region: Secondary | ICD-10-CM | POA: Diagnosis not present

## 2019-10-07 DIAGNOSIS — M1711 Unilateral primary osteoarthritis, right knee: Secondary | ICD-10-CM | POA: Diagnosis not present

## 2019-10-07 DIAGNOSIS — M19011 Primary osteoarthritis, right shoulder: Secondary | ICD-10-CM | POA: Diagnosis not present

## 2019-10-07 DIAGNOSIS — M48062 Spinal stenosis, lumbar region with neurogenic claudication: Secondary | ICD-10-CM | POA: Diagnosis not present

## 2019-10-07 DIAGNOSIS — M1712 Unilateral primary osteoarthritis, left knee: Secondary | ICD-10-CM | POA: Diagnosis not present

## 2019-10-07 DIAGNOSIS — G894 Chronic pain syndrome: Secondary | ICD-10-CM | POA: Diagnosis not present

## 2019-10-10 ENCOUNTER — Encounter (HOSPITAL_COMMUNITY): Admission: RE | Admit: 2019-10-10 | Payer: Medicare Other | Source: Ambulatory Visit

## 2019-10-16 DIAGNOSIS — M1711 Unilateral primary osteoarthritis, right knee: Secondary | ICD-10-CM | POA: Diagnosis not present

## 2019-10-16 DIAGNOSIS — M1712 Unilateral primary osteoarthritis, left knee: Secondary | ICD-10-CM | POA: Diagnosis not present

## 2019-10-16 DIAGNOSIS — M25561 Pain in right knee: Secondary | ICD-10-CM | POA: Diagnosis not present

## 2019-10-16 DIAGNOSIS — M25562 Pain in left knee: Secondary | ICD-10-CM | POA: Diagnosis not present

## 2019-10-17 ENCOUNTER — Other Ambulatory Visit: Payer: Self-pay | Admitting: Orthopedic Surgery

## 2019-10-21 ENCOUNTER — Other Ambulatory Visit: Payer: Self-pay | Admitting: Orthopedic Surgery

## 2019-10-21 DIAGNOSIS — N35011 Post-traumatic bulbous urethral stricture: Secondary | ICD-10-CM | POA: Diagnosis not present

## 2019-10-23 ENCOUNTER — Other Ambulatory Visit: Payer: Self-pay | Admitting: Urology

## 2019-10-23 DIAGNOSIS — Z01810 Encounter for preprocedural cardiovascular examination: Secondary | ICD-10-CM | POA: Diagnosis not present

## 2019-10-23 DIAGNOSIS — R9431 Abnormal electrocardiogram [ECG] [EKG]: Secondary | ICD-10-CM | POA: Diagnosis not present

## 2019-10-23 DIAGNOSIS — Z7189 Other specified counseling: Secondary | ICD-10-CM | POA: Diagnosis not present

## 2019-10-28 DIAGNOSIS — R9431 Abnormal electrocardiogram [ECG] [EKG]: Secondary | ICD-10-CM | POA: Diagnosis not present

## 2019-10-29 DIAGNOSIS — M25561 Pain in right knee: Secondary | ICD-10-CM | POA: Diagnosis not present

## 2019-11-03 DIAGNOSIS — M1712 Unilateral primary osteoarthritis, left knee: Secondary | ICD-10-CM | POA: Diagnosis not present

## 2019-11-03 DIAGNOSIS — M1711 Unilateral primary osteoarthritis, right knee: Secondary | ICD-10-CM | POA: Diagnosis not present

## 2019-11-03 DIAGNOSIS — M19011 Primary osteoarthritis, right shoulder: Secondary | ICD-10-CM | POA: Diagnosis not present

## 2019-11-03 DIAGNOSIS — G894 Chronic pain syndrome: Secondary | ICD-10-CM | POA: Diagnosis not present

## 2019-11-04 ENCOUNTER — Inpatient Hospital Stay (HOSPITAL_COMMUNITY): Admission: RE | Admit: 2019-11-04 | Payer: Medicare Other | Source: Ambulatory Visit

## 2019-11-04 DIAGNOSIS — R9431 Abnormal electrocardiogram [ECG] [EKG]: Secondary | ICD-10-CM | POA: Diagnosis not present

## 2019-11-04 NOTE — Patient Instructions (Addendum)
DUE TO COVID-19 ONLY ONE VISITOR IS ALLOWED TO COME WITH YOU AND STAY IN THE WAITING ROOM ONLY DURING PRE OP AND PROCEDURE DAY OF SURGERY. THE 1 VISITOR MAY VISIT WITH YOU AFTER SURGERY IN YOUR PRIVATE ROOM DURING VISITING HOURS ONLY!  ONCE YOUR COVID TEST IS COMPLETED, PLEASE BEGIN THE QUARANTINE INSTRUCTIONS AS OUTLINED IN YOUR HANDOUT.                [facility]    Your procedure is scheduled on: 11/07/19   Report to [facility] Main  Entrance   Report to admitting at 7:00 AM     Call this number if you have problems the morning of surgery [phone]   . BRUSH YOUR TEETH MORNING OF SURGERY AND RINSE YOUR MOUTH OUT, NO CHEWING GUM CANDY OR MINTS.   Do not eat food After Midnight.   YOU MAY HAVE CLEAR LIQUIDS FROM MIDNIGHT UNTIL 6:30 AM.   At 6:30 AM Please finish the prescribed Pre-Surgery Gatorade drink.   Nothing by mouth after you finish the Gatorade drink !    Take these medicines the morning of surgery with A SIP OF WATER: Oxycodone, gabapentin                                You may not have any metal on your body including piercings              Do not wear jewelry,  lotions, powders or  deodorant                        Men may shave face and neck.   Do not bring valuables to the hospital. [place].  Contacts, dentures or bridgework may not be worn into surgery.       Name and phone number of your driver:  Special Instructions: N/A              Please read over the following fact sheets you were given: _____________________________________________________________________             [facility] - Preparing for Surgery  Before surgery, you can play an important role.   Because skin is not sterile, your skin needs to be as free of germs as possible.   You can reduce the number of germs on your skin by washing with CHG (chlorahexidine gluconate) soap before surgery.   CHG is an antiseptic  cleaner which kills germs and bonds with the skin to continue killing germs even after washing. Please DO NOT use if you have an allergy to CHG or antibacterial soaps.   If your skin becomes reddened/irritated stop using the CHG and inform your nurse when you arrive at Short Stay.  You may shave your face/neck.  Please follow these instructions carefully:  1.  Shower with CHG Soap the night before surgery and the  morning of Surgery.  2.  If you choose to wash your hair, wash your hair first as usual with your  normal  shampoo.  3.  After you shampoo, rinse your hair and body thoroughly to remove the  shampoo.  4.  Use CHG as you would any other liquid soap.  You can apply chg directly  to the skin and wash                       Gently with a scrungie or clean washcloth.  5.  Apply the CHG Soap to your body ONLY FROM THE NECK DOWN.   Do not use on face/ open                           Wound or open sores. Avoid contact with eyes, ears mouth and genitals (private parts).                       Wash face,  Genitals (private parts) with your normal soap.             6.  Wash thoroughly, paying special attention to the area where your surgery  will be performed.  7.  Thoroughly rinse your body with warm water from the neck down.  8.  DO NOT shower/wash with your normal soap after using and rinsing off  the CHG Soap.             9.  Pat yourself dry with a clean towel.            10.  Wear clean pajamas.            11.  Place clean sheets on your bed the night of your first shower and do not  sleep with pets. Day of Surgery : Do not apply any lotions/deodorants the morning of surgery.  Please wear clean clothes to the hospital/surgery center.  FAILURE TO FOLLOW THESE INSTRUCTIONS MAY RESULT IN THE CANCELLATION OF YOUR SURGERY PATIENT SIGNATURE_________________________________  NURSE  SIGNATURE__________________________________  ________________________________________________________________________   [facility]  An incentive spirometer is a tool that can help keep your lungs clear and active. This tool measures how well you are filling your lungs with each breath. Taking long deep breaths may help reverse or decrease the chance of developing breathing (pulmonary) problems (especially infection) following:  A long period of time when you are unable to move or be active. BEFORE THE PROCEDURE   If the spirometer includes an indicator to show your best effort, your nurse or respiratory therapist will set it to a desired goal.  If possible, sit up straight or lean slightly forward. Try not to slouch.  Hold the incentive spirometer in an upright position. INSTRUCTIONS FOR USE  1. Sit on the edge of your bed if possible, or sit up as far as you can in bed or on a chair. 2. Hold the incentive spirometer in an upright position. 3. Breathe out normally. 4. Place the mouthpiece in your mouth and seal your lips tightly around it. 5. Breathe in slowly and as deeply as possible, raising the piston or the ball toward the top of the column. 6. Hold your breath for 3-5 seconds or for as long as possible. Allow the piston or ball to fall to the bottom of the column. 7. Remove the mouthpiece from your mouth and breathe out normally. 8. Rest for a few seconds and repeat Steps 1 through 7 at least 10 times every 1-2 hours when you are awake. Take your time and take a few normal breaths between deep breaths. 9. The spirometer may include an indicator to show your best effort.  Use the indicator as a goal to work toward during each repetition. 10. After each set of 10 deep breaths, practice coughing to be sure your lungs are clear. If you have an incision (the cut made at the time of surgery), support your incision when coughing by placing a pillow or rolled up towels firmly  against it. Once you are able to get out of bed, walk around indoors and cough well. You may stop using the incentive spirometer when instructed by your caregiver.  RISKS AND COMPLICATIONS  Take your time so you do not get dizzy or light-headed.  If you are in pain, you may need to take or ask for pain medication before doing incentive spirometry. It is harder to take a deep breath if you are having pain. AFTER USE  Rest and breathe slowly and easily.  It can be helpful to keep track of a log of your progress. Your caregiver can provide you with a simple table to help with this. If you are using the spirometer at home, follow these instructions: [place] IF:   You are having difficultly using the spirometer.  You have trouble using the spirometer as often as instructed.  Your pain medication is not giving enough relief while using the spirometer.  You develop fever of 100.5 F (38.1 C) or higher. SEEK IMMEDIATE MEDICAL CARE IF:   You cough up bloody sputum that had not been present before.  You develop fever of 102 F (38.9 C) or greater.  You develop worsening pain at or near the incision site. MAKE SURE YOU:   Understand these instructions.  Will watch your condition.  Will get help right away if you are not doing well or get worse. Document Released: 04/16/2007 Document Revised: 02/26/2012 Document Reviewed: 06/17/2007 [facility] Patient Information 2014 [place], [place].   ________________________________________________________________________

## 2019-11-05 ENCOUNTER — Encounter (HOSPITAL_COMMUNITY)
Admission: RE | Admit: 2019-11-05 | Discharge: 2019-11-05 | Disposition: A | Discharge status: Home / Self Care | Payer: Medicare Other | Source: Ambulatory Visit | Attending: Orthopedic Surgery | Admitting: Orthopedic Surgery

## 2019-11-07 ENCOUNTER — Ambulatory Visit (HOSPITAL_COMMUNITY): Admission: RE | Admit: 2019-11-07 | Payer: Medicare Other | Source: Home / Self Care | Admitting: Orthopedic Surgery

## 2019-11-07 ENCOUNTER — Emergency Department (HOSPITAL_COMMUNITY): Payer: Medicare Other

## 2019-11-07 ENCOUNTER — Other Ambulatory Visit: Payer: Self-pay

## 2019-11-07 ENCOUNTER — Encounter (HOSPITAL_COMMUNITY): Admission: RE | Payer: Self-pay | Source: Home / Self Care

## 2019-11-07 ENCOUNTER — Emergency Department (HOSPITAL_COMMUNITY)
Admission: EM | Admit: 2019-11-07 | Discharge: 2019-11-07 | Disposition: A | Discharge status: Home / Self Care | Payer: Medicare Other | Attending: Emergency Medicine | Admitting: Emergency Medicine

## 2019-11-07 DIAGNOSIS — Y929 Unspecified place or not applicable: Secondary | ICD-10-CM | POA: Diagnosis not present

## 2019-11-07 DIAGNOSIS — R0689 Other abnormalities of breathing: Secondary | ICD-10-CM | POA: Diagnosis not present

## 2019-11-07 DIAGNOSIS — Z23 Encounter for immunization: Secondary | ICD-10-CM | POA: Diagnosis not present

## 2019-11-07 DIAGNOSIS — R0602 Shortness of breath: Secondary | ICD-10-CM | POA: Diagnosis not present

## 2019-11-07 DIAGNOSIS — W19XXXA Unspecified fall, initial encounter: Secondary | ICD-10-CM | POA: Insufficient documentation

## 2019-11-07 DIAGNOSIS — R4182 Altered mental status, unspecified: Secondary | ICD-10-CM | POA: Diagnosis not present

## 2019-11-07 DIAGNOSIS — R000 Tachycardia, unspecified: Secondary | ICD-10-CM | POA: Diagnosis not present

## 2019-11-07 DIAGNOSIS — Z87891 Personal history of nicotine dependence: Secondary | ICD-10-CM | POA: Diagnosis not present

## 2019-11-07 DIAGNOSIS — J449 Chronic obstructive pulmonary disease, unspecified: Secondary | ICD-10-CM | POA: Diagnosis not present

## 2019-11-07 DIAGNOSIS — S8992XA Unspecified injury of left lower leg, initial encounter: Secondary | ICD-10-CM | POA: Diagnosis not present

## 2019-11-07 DIAGNOSIS — Y939 Activity, unspecified: Secondary | ICD-10-CM | POA: Insufficient documentation

## 2019-11-07 DIAGNOSIS — Y999 Unspecified external cause status: Secondary | ICD-10-CM | POA: Diagnosis not present

## 2019-11-07 DIAGNOSIS — I2510 Atherosclerotic heart disease of native coronary artery without angina pectoris: Secondary | ICD-10-CM | POA: Insufficient documentation

## 2019-11-07 DIAGNOSIS — Z79899 Other long term (current) drug therapy: Secondary | ICD-10-CM | POA: Insufficient documentation

## 2019-11-07 DIAGNOSIS — I10 Essential (primary) hypertension: Secondary | ICD-10-CM | POA: Diagnosis not present

## 2019-11-07 DIAGNOSIS — Z743 Need for continuous supervision: Secondary | ICD-10-CM | POA: Diagnosis not present

## 2019-11-07 DIAGNOSIS — S81012A Laceration without foreign body, left knee, initial encounter: Secondary | ICD-10-CM | POA: Diagnosis not present

## 2019-11-07 DIAGNOSIS — M7989 Other specified soft tissue disorders: Secondary | ICD-10-CM | POA: Diagnosis not present

## 2019-11-07 LAB — COMPREHENSIVE METABOLIC PANEL
ALT: 20 U/L (ref 0–44)
AST: 28 U/L (ref 15–41)
Albumin: 4.2 g/dL (ref 3.5–5.0)
Alkaline Phosphatase: 80 U/L (ref 38–126)
Anion gap: 13 (ref 5–15)
BUN: 20 mg/dL (ref 8–23)
CO2: 25 mmol/L (ref 22–32)
Calcium: 9.1 mg/dL (ref 8.9–10.3)
Chloride: 100 mmol/L (ref 98–111)
Creatinine, Ser: 1.24 mg/dL (ref 0.61–1.24)
GFR calc Af Amer: >60 mL/min (ref >60)
GFR calc non Af Amer: 58 mL/min — ABNORMAL LOW (ref >60)
Glucose, Bld: 123 mg/dL — ABNORMAL HIGH (ref 70–99)
Potassium: 4.5 mmol/L (ref 3.5–5.1)
Sodium: 138 mmol/L (ref 135–145)
Total Bilirubin: 0.6 mg/dL (ref 0.3–1.2)
Total Protein: 7 g/dL (ref 6.5–8.1)

## 2019-11-07 LAB — POCT I-STAT 7, (LYTES, BLD GAS, ICA,H+H)
Acid-Base Excess: 1 mmol/L (ref 0.0–2.0)
Bicarbonate: 27.8 mmol/L (ref 20.0–28.0)
Calcium, Ion: 1.22 mmol/L (ref 1.15–1.40)
HCT: 47 % (ref 39.0–52.0)
Hemoglobin: 16 g/dL (ref 13.0–17.0)
O2 Saturation: 93 %
Potassium: 4.3 mmol/L (ref 3.5–5.1)
Sodium: 137 mmol/L (ref 135–145)
TCO2: 29 mmol/L (ref 22–32)
pCO2 arterial: 53.5 mmHg — ABNORMAL HIGH (ref 32.0–48.0)
pH, Arterial: 7.324 — ABNORMAL LOW (ref 7.350–7.450)
pO2, Arterial: 72 mmHg — ABNORMAL LOW (ref 83.0–108.0)

## 2019-11-07 LAB — URINALYSIS, COMPLETE (UACMP) WITH MICROSCOPIC
Bilirubin Urine: NEGATIVE
Glucose, UA: NEGATIVE mg/dL
Hgb urine dipstick: NEGATIVE
Ketones, ur: NEGATIVE mg/dL
Leukocytes,Ua: NEGATIVE
Nitrite: NEGATIVE
Protein, ur: NEGATIVE mg/dL
Specific Gravity, Urine: 1.017 (ref 1.005–1.030)
pH: 5 (ref 5.0–8.0)

## 2019-11-07 LAB — CBC WITH DIFFERENTIAL/PLATELET
Abs Immature Granulocytes: 0.03 10*3/uL (ref 0.00–0.07)
Basophils Absolute: 0.1 10*3/uL (ref 0.0–0.1)
Basophils Relative: 1 %
Eosinophils Absolute: 0.4 10*3/uL (ref 0.0–0.5)
Eosinophils Relative: 5 %
HCT: 48.2 % (ref 39.0–52.0)
Hemoglobin: 16 g/dL (ref 13.0–17.0)
Immature Granulocytes: 0 %
Lymphocytes Relative: 25 %
Lymphs Abs: 2.2 10*3/uL (ref 0.7–4.0)
MCH: 32.3 pg (ref 26.0–34.0)
MCHC: 33.2 g/dL (ref 30.0–36.0)
MCV: 97.2 fL (ref 80.0–100.0)
Monocytes Absolute: 0.8 10*3/uL (ref 0.1–1.0)
Monocytes Relative: 9 %
Neutro Abs: 5.4 10*3/uL (ref 1.7–7.7)
Neutrophils Relative %: 60 %
Platelets: 297 10*3/uL (ref 150–400)
RBC: 4.96 MIL/uL (ref 4.22–5.81)
RDW: 13.1 % (ref 11.5–15.5)
WBC: 8.9 10*3/uL (ref 4.0–10.5)
nRBC: 0 % (ref 0.0–0.2)

## 2019-11-07 LAB — ETHANOL: Alcohol, Ethyl (B): 29 mg/dL — ABNORMAL HIGH (ref <10)

## 2019-11-07 LAB — AMMONIA: Ammonia: 39 umol/L — ABNORMAL HIGH (ref 9–35)

## 2019-11-07 LAB — RAPID URINE DRUG SCREEN, HOSP PERFORMED
Amphetamines: NOT DETECTED
Barbiturates: NOT DETECTED
Benzodiazepines: NOT DETECTED
Cocaine: NOT DETECTED
Opiates: POSITIVE — AB
Tetrahydrocannabinol: POSITIVE — AB

## 2019-11-07 LAB — LACTIC ACID, PLASMA: Lactic Acid, Venous: 1.9 mmol/L (ref 0.5–1.9)

## 2019-11-07 IMAGING — DX DG CHEST 1V PORT
1 series · 1 of 1 positions shown · non-contrast
Comparison: [DATE]

CLINICAL DATA: Shortness of breath, hypoxia

EXAM:
PORTABLE CHEST 1 VIEW

[chest ap]
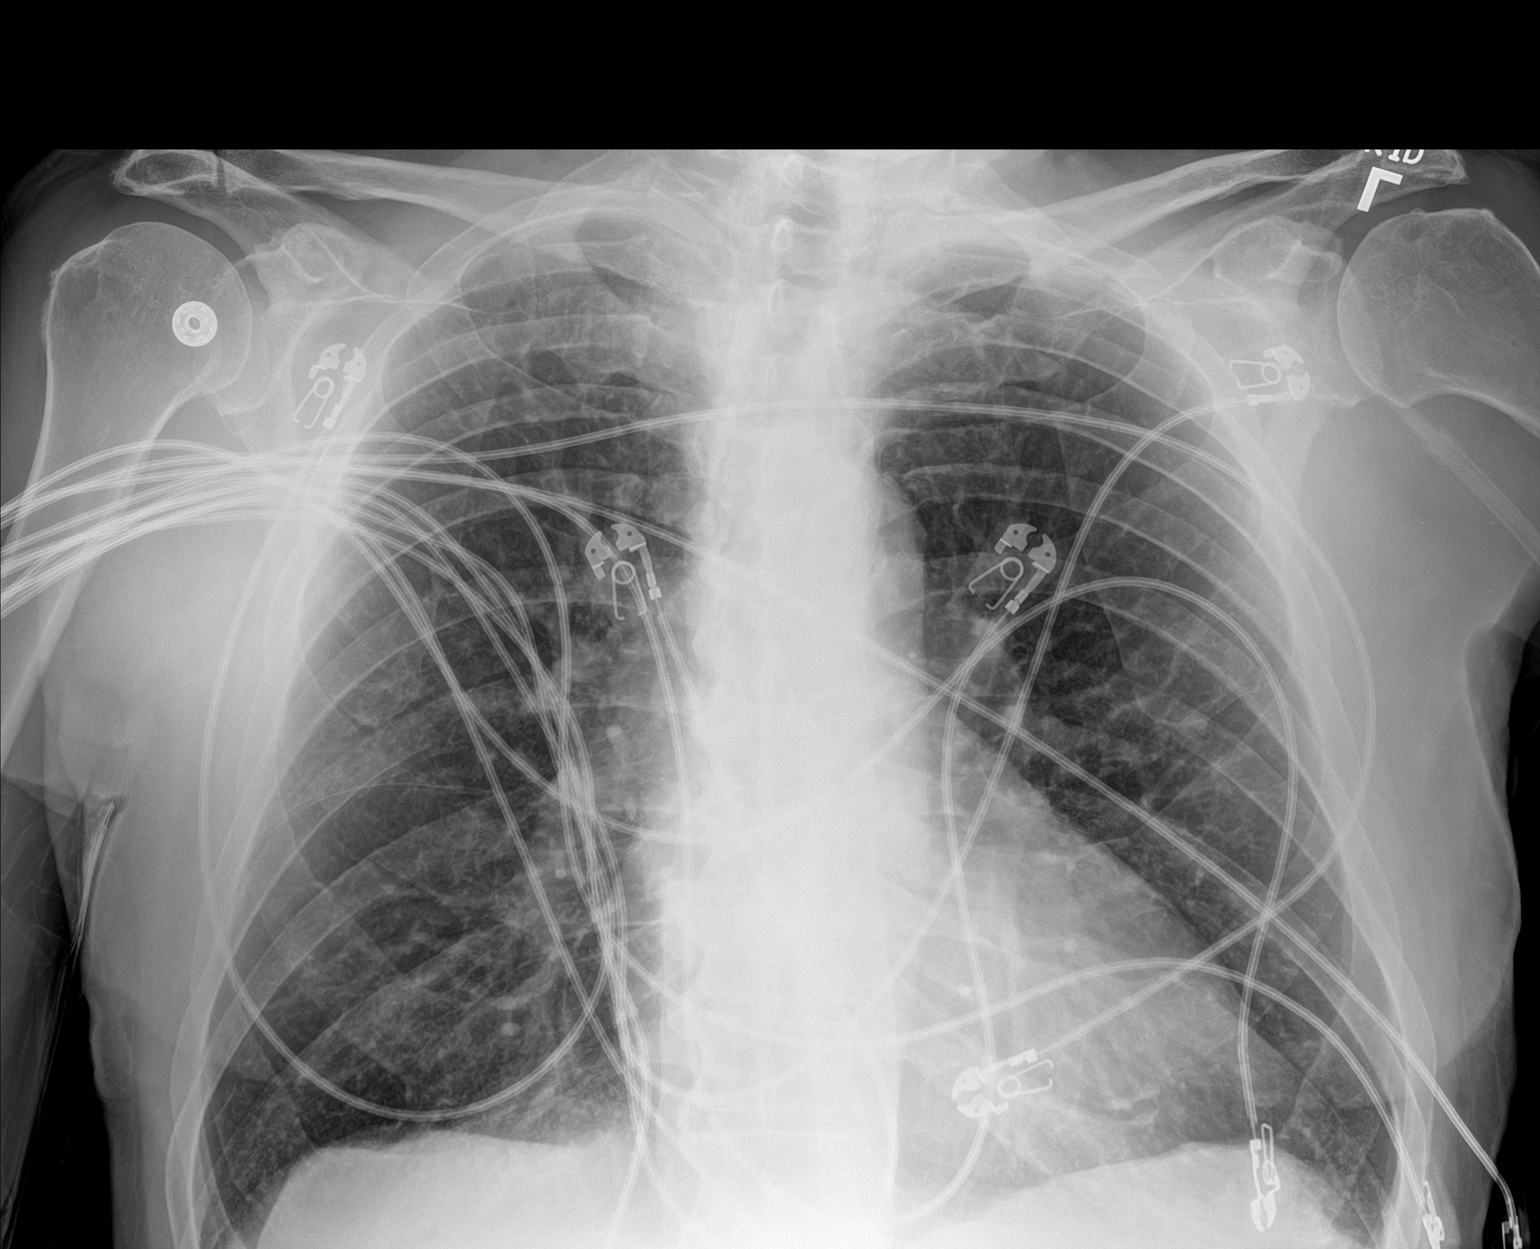

[1 of 1 positions shown; findings below may reference images not displayed]

FINDINGS: Heart and mediastinal contours are within normal limits. No focal
opacities or effusions. No acute bony abnormality.
IMPRESSION: No active disease.

## 2019-11-07 IMAGING — CT CT ANGIO CHEST
2 of 7 series · 17 of 46 positions shown · IV contrast (APPLIED)
Comparison: Radiograph earlier this day. Chest CT [DATE]

CLINICAL DATA: PE suspected, high pretest prob. Shortness of breath
and hypoxia.

EXAM:
CT ANGIOGRAPHY CHEST WITH CONTRAST
TECHNIQUE: Multidetector CT imaging of the chest was performed using the
standard protocol during bolus administration of intravenous
contrast. Multiplanar CT image reconstructions and MIPs were
obtained to evaluate the vascular anatomy.
CONTRAST:  100mL OMNIPAQUE IOHEXOL 350 MG/ML SOLN

[Series 7: thins · axial · 0.76mm/px · z∈[+1219,+1504]mm · 14 of 460 slices shown]
[im 26/460  lung]
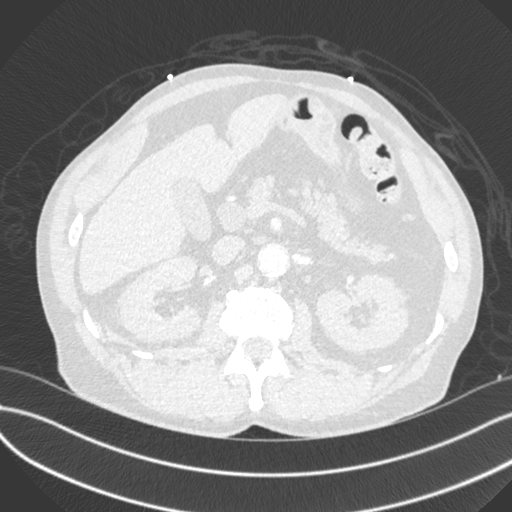
[im 52/460  soft-tissue]
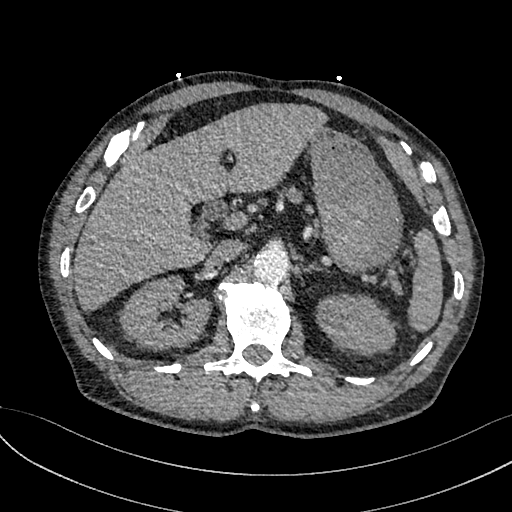
[im 103/460  lung]
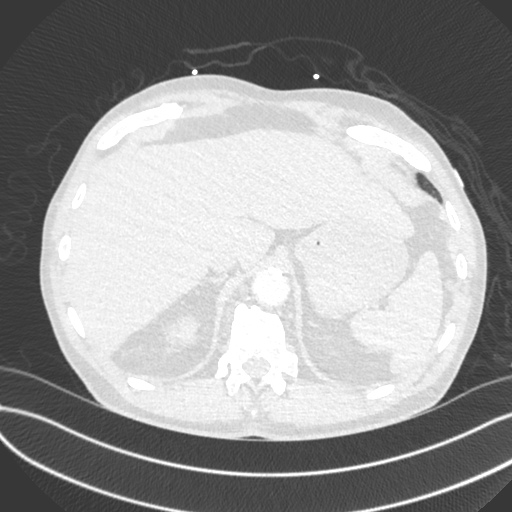
[im 128/460  soft-tissue]
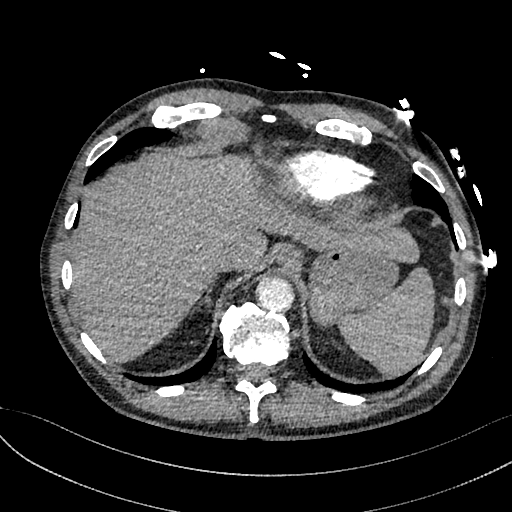
[im 154/460  lung]
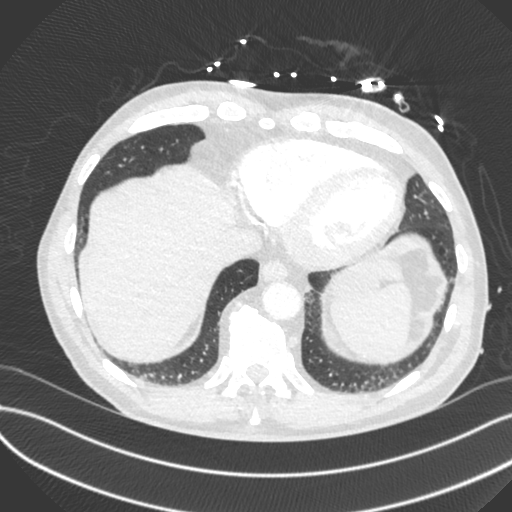
[im 179/460  soft-tissue]
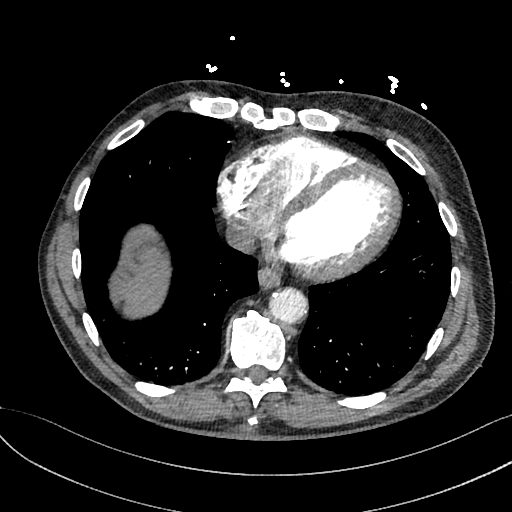
[im 205/460  lung]
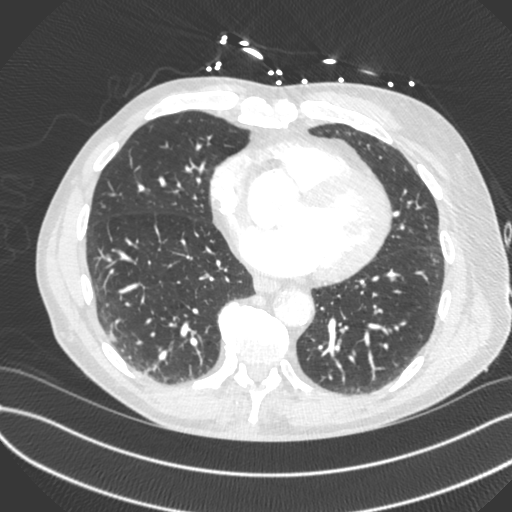
[im 256/460  soft-tissue]
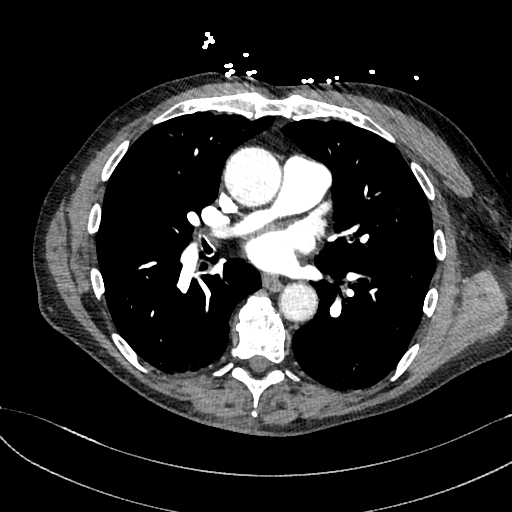
[im 281/460  lung]
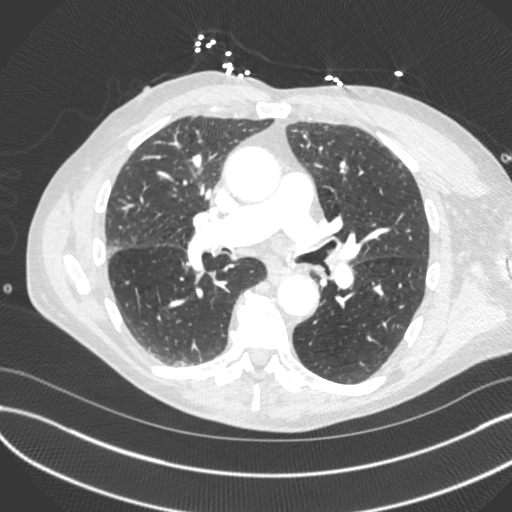
[im 307/460  soft-tissue]
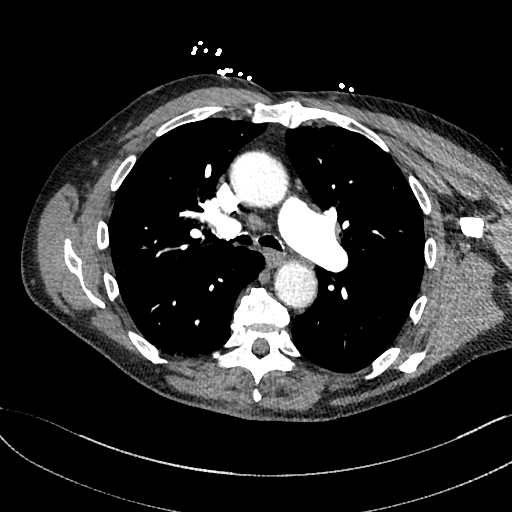
[im 332/460  lung]
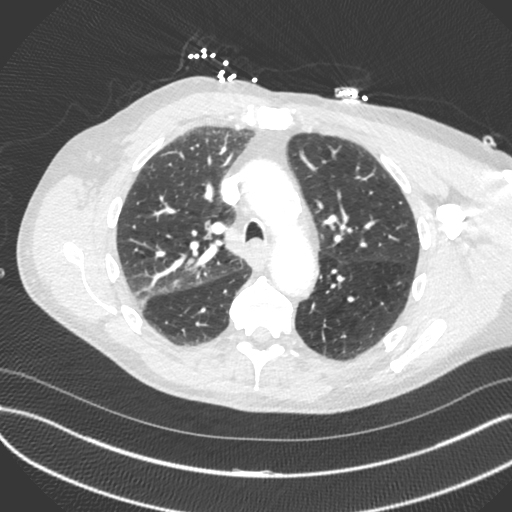
[im 358/460  soft-tissue]
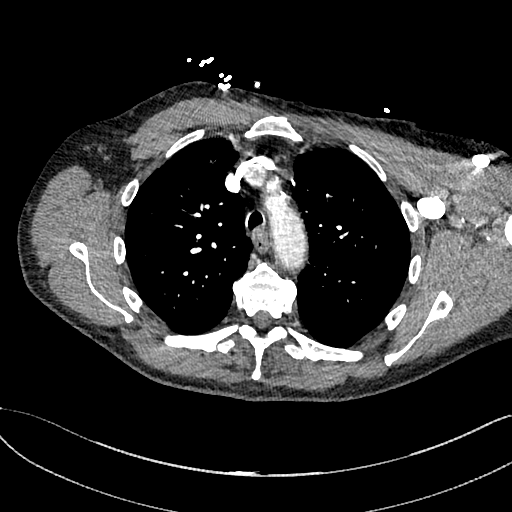
[im 409/460  lung]
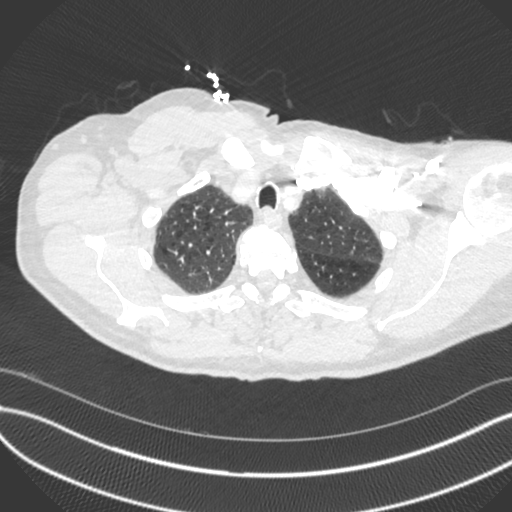
[im 434/460  soft-tissue]
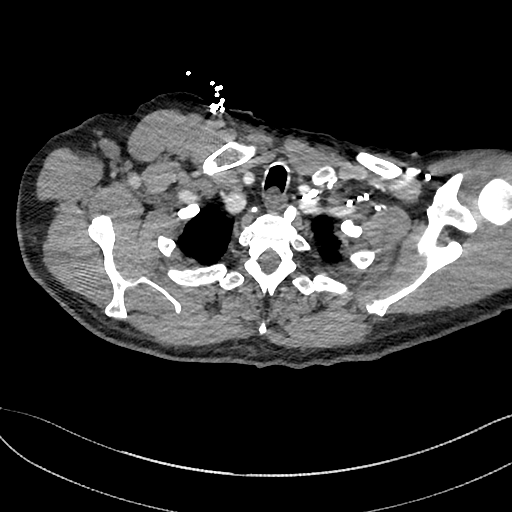

[Series 8: cor · coronal · 0.63mm/px · 3 of 157 slices shown]
[im 40/157  soft-tissue]
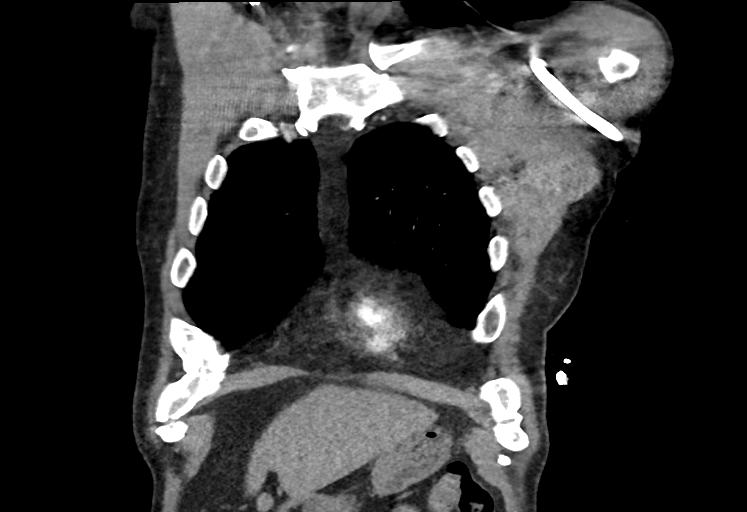
[im 79/157  soft-tissue]
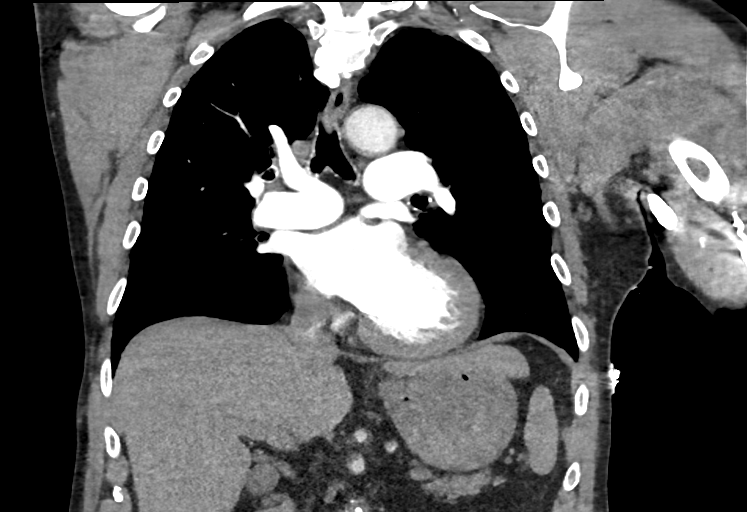
[im 118/157  soft-tissue]
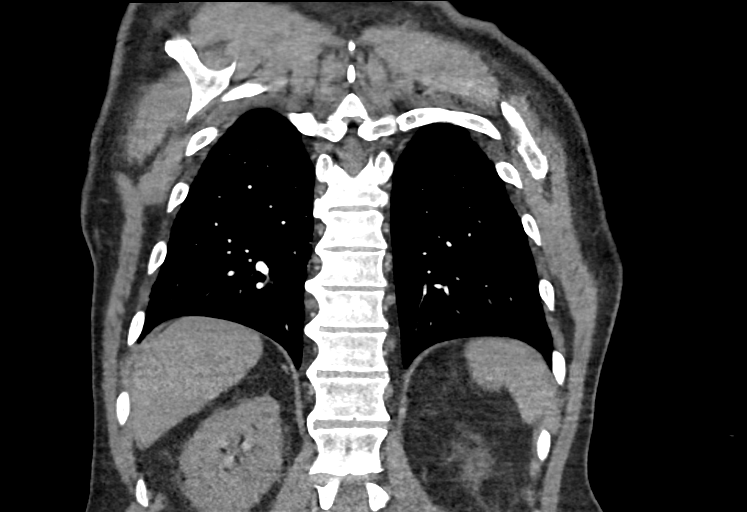

[17 of 46 positions shown; findings below may reference images not displayed]

FINDINGS: Cardiovascular: There are no filling defects within the pulmonary
arteries to suggest pulmonary embolus. Moderate calcified and
noncalcified atheromatous plaque throughout the thoracic aorta
without dissection or evidence of acute aortic abnormality. Left
vertebral artery arises directly from the thoracic aorta, variant
arch anatomy. Aneurysmal dilatation of the ascending aorta measuring
4.4 cm, previously 4.0 cm. Coronary artery calcifications. Heart is
normal in size. No pericardial effusion.

Mediastinum/Nodes: Small mediastinal and hilar nodes not enlarged by
size criteria. Decompressed esophagus. No visualized thyroid nodule.

Lungs/Pleura: Emphysema. Relatively severe bronchial thickening with
areas of mucous plugging. Filling defects adherent to the
tracheobronchial walls in the trachea, right mainstem, right
bronchus intermedius and left upper lobe bronchus, some of which
appear nodular. Scattered tiny pulmonary nodules as well as
calcified granuloma, unchanged from [75] exam and considered benign.
No confluent airspace disease. No pleural fluid. No pulmonary edema.
No pulmonary mass.

Upper Abdomen: Common bile duct dilatation, partially included. This
appears similar to [DATE] abdominal CT. No abnormal gallbladder
distention.

Musculoskeletal: Multilevel degenerative change throughout the
thoracic spine. There are no acute or suspicious osseous
abnormalities.

Review of the MIP images confirms the above findings.
IMPRESSION: 1. No pulmonary embolus or aortic dissection.
2. Ascending thoracic aortic aneurysm, maximal dimension 4.4 cm.
Recommend annual imaging followup by CTA or MRA. This recommendation
follows [75] ACCF/AHA/AATS/ACR/ASA/SCA/LOLY/LOLY/LOLY/LOLY Guidelines
for the Diagnosis and Management of Patients with Thoracic Aortic
Disease. Circulation. [75]; 121: E266-e369. Aortic aneurysm NOS
([75]-[75])
3. Emphysema. Advanced bronchial thickening with areas of mucous
plugging. Filling defects adherent to the tracheobronchial walls in
the trachea, some of which appear nodular. Findings may be due to
adherent mucus, however soft tissue nodule such is papillomatosis
could have a similar appearance.
4. Chronic extrahepatic biliary ductal dilatation, unchanged from
[DATE] abdominal CT.

Aortic Atherosclerosis ([75]-[75]) and Emphysema ([75]-[75]).

## 2019-11-07 IMAGING — CT CT HEAD W/O CM
4 series · 17 of 47 positions shown, 19 images · non-contrast
Comparison: None.

CLINICAL DATA: Head trauma, minor, GCS>=13, high clinical risk,
initial exam

Altered mental status.
EXAM:
CT HEAD WITHOUT CONTRAST
TECHNIQUE: Contiguous axial images were obtained from the base of the skull
through the vertex without intravenous contrast.

[Series 3: head wo · axial · 0.46mm/px · z∈[-127,+8]mm · 7 of 37 slices shown, 9 images]
[im 5/37  brain]
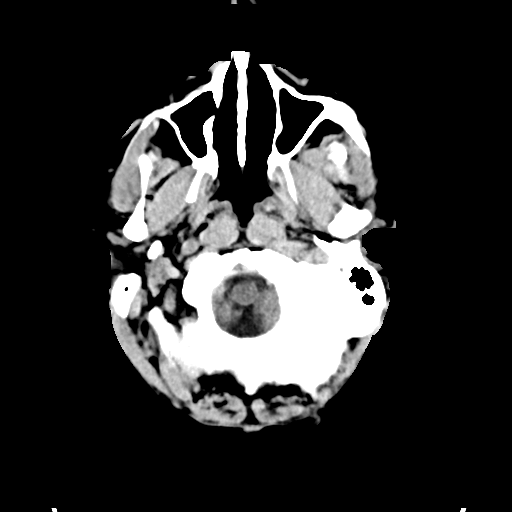
[im 5/37  bone]
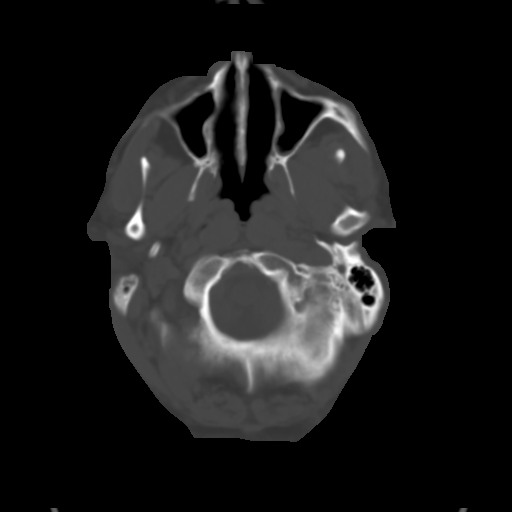
[im 10/37  brain]
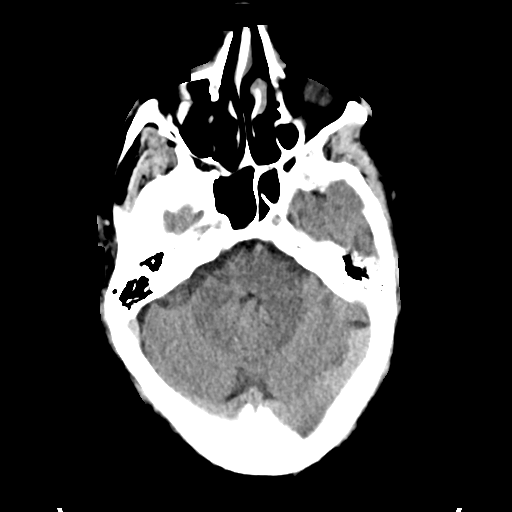
[im 14/37  brain]
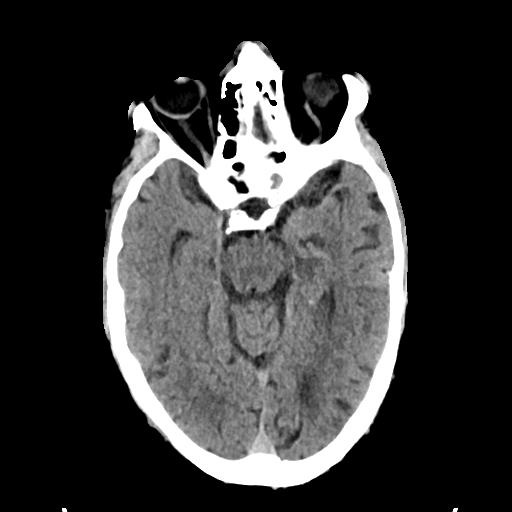
[im 19/37  brain]
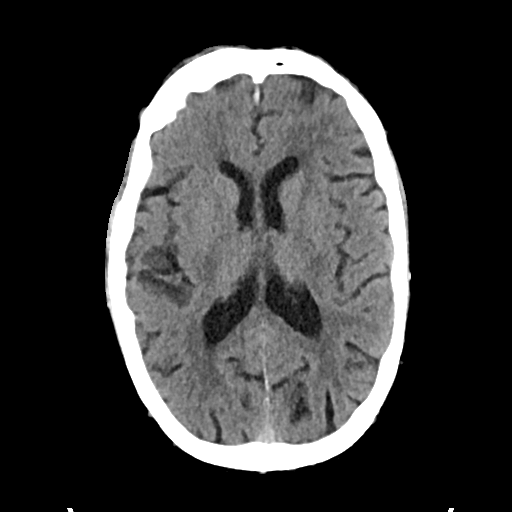
[im 23/37  brain]
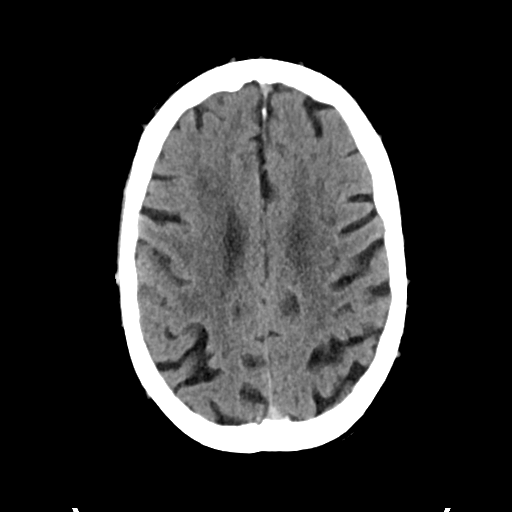
[im 23/37  bone]
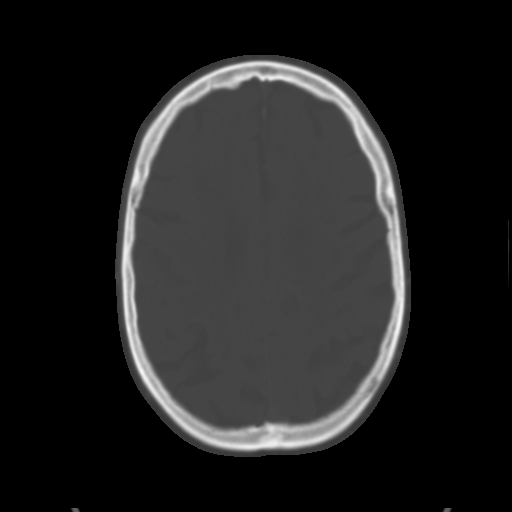
[im 28/37  brain]
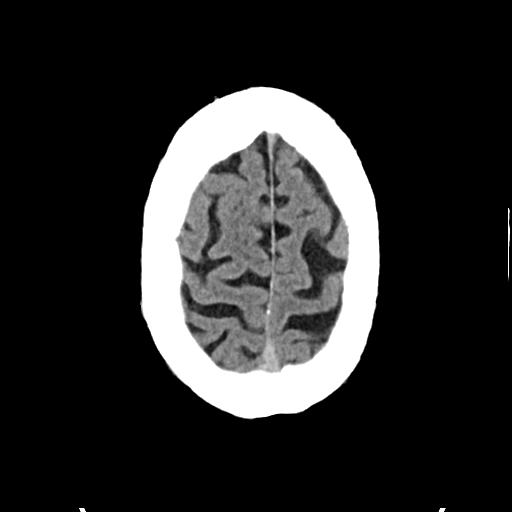
[im 32/37  brain]
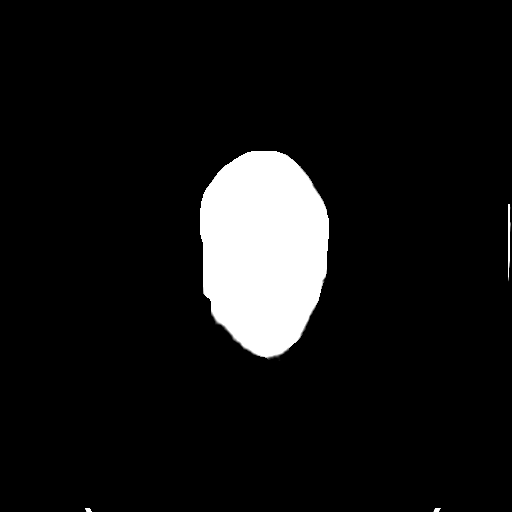

[Series 4: head bone · axial · 0.46mm/px · z∈[-129,-67]mm · 4 of 91 slices shown]
[im 10/91  bone]
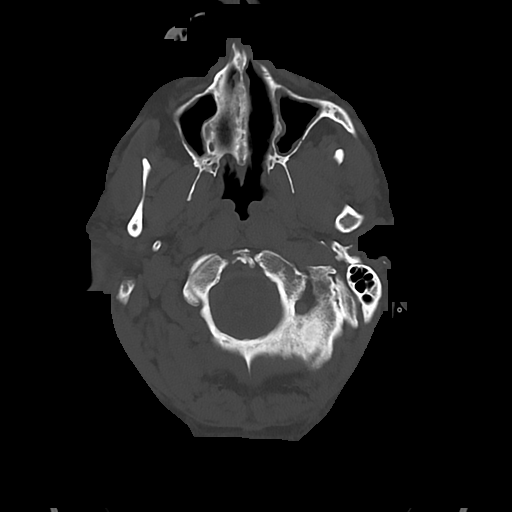
[im 19/91  bone]
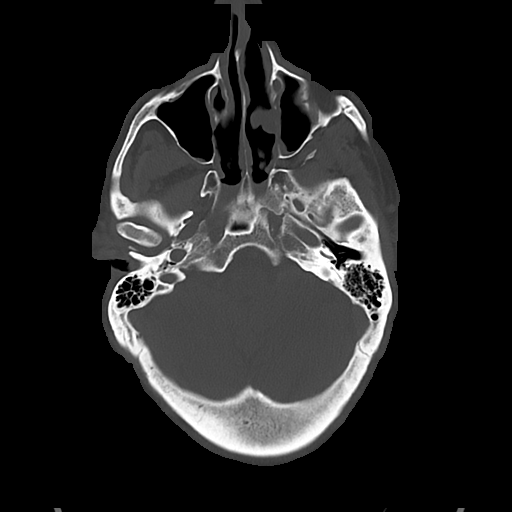
[im 28/91  bone]
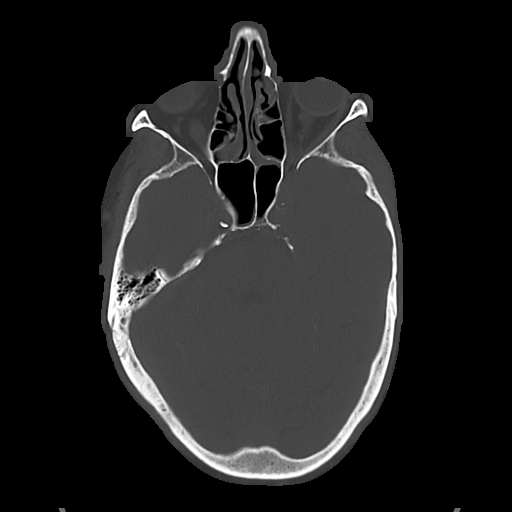
[im 41/91  bone]
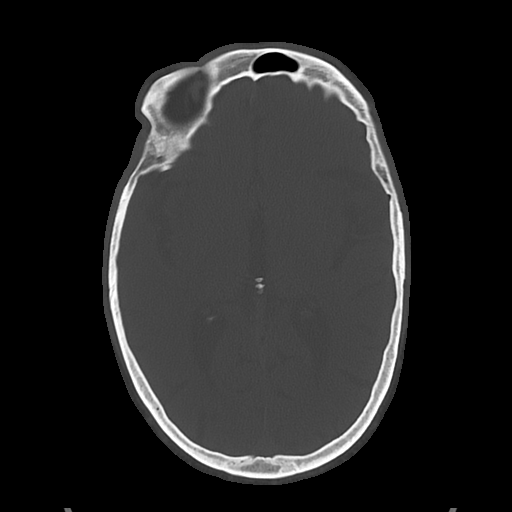

[Series 5: cor soft · coronal · 0.42mm/px · 3 of 88 slices shown]
[im 30/88  brain]
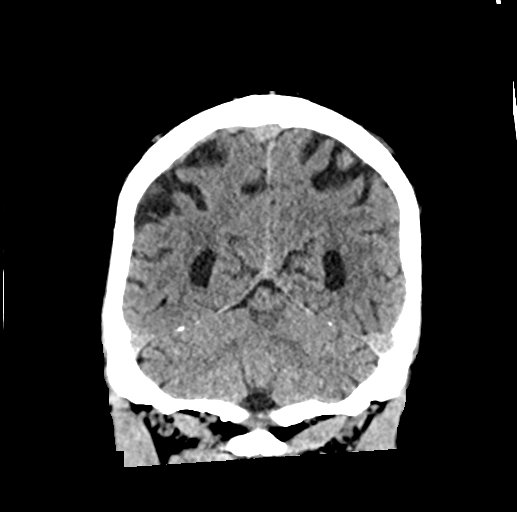
[im 39/88  brain]
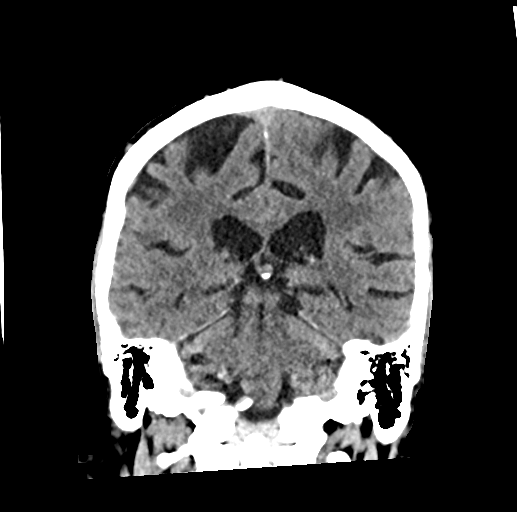
[im 49/88  brain]
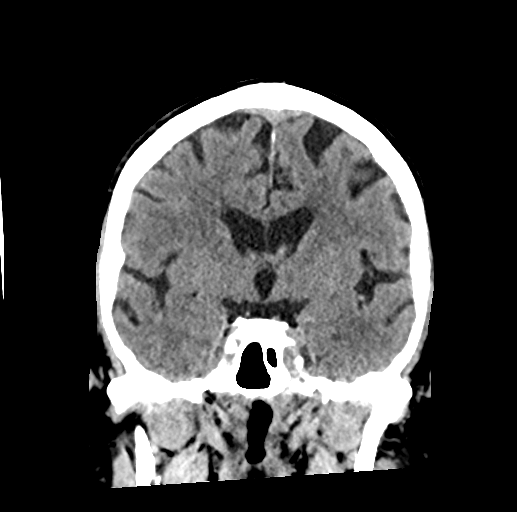

[Series 6: sag soft · sagittal · 0.43mm/px · 3 of 62 slices shown]
[im 21/62  brain]
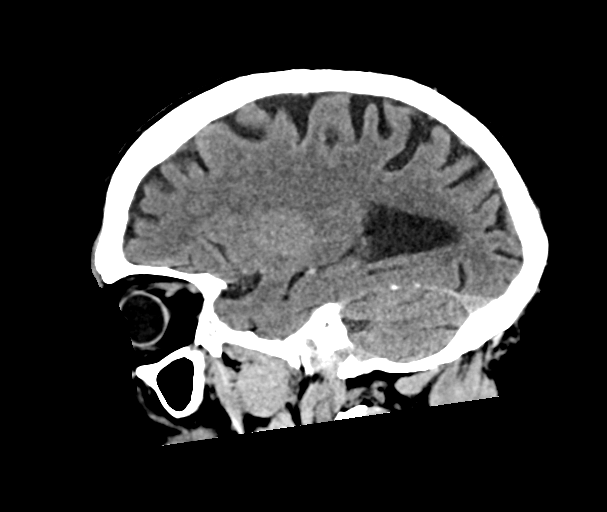
[im 31/62  brain]
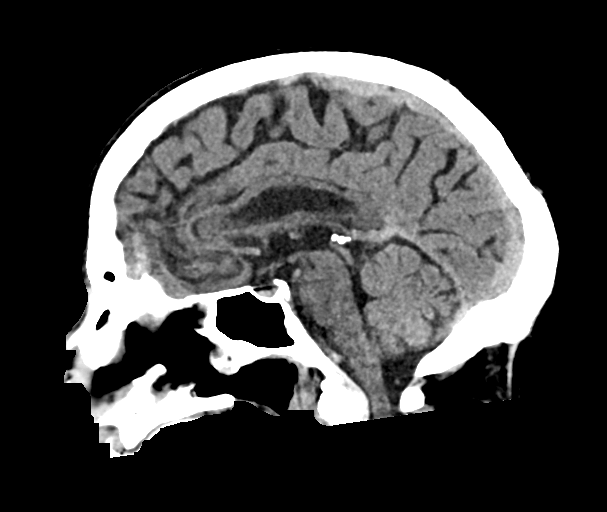
[im 41/62  brain]
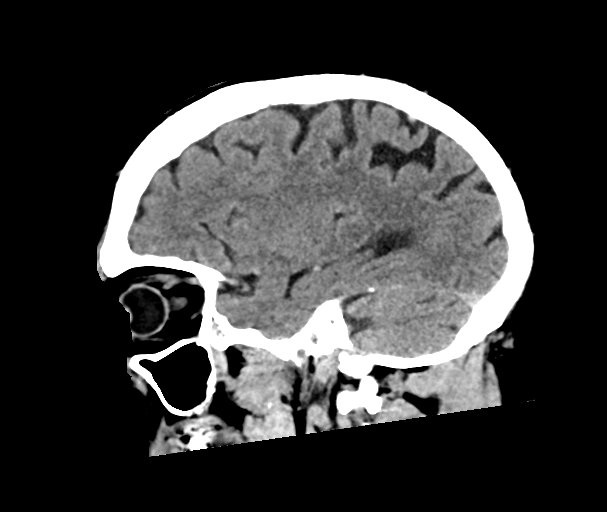

[17 of 47 positions shown; findings below may reference images not displayed]

FINDINGS: Brain: No intracranial hemorrhage, mass effect, or midline shift.
Mild atrophy and chronic small vessel ischemia. No hydrocephalus.
The basilar cisterns are patent. No evidence of territorial infarct
or acute ischemia. No extra-axial or intracranial fluid collection.

Vascular: Atherosclerosis of skullbase vasculature without
hyperdense vessel or abnormal calcification.

Skull: No fracture or focal lesion.

Sinuses/Orbits: Mucosal thickening of the paranasal sinuses without
fluid level. No acute fracture. The mastoid air cells are clear.

Other: None.
IMPRESSION: 1. No acute intracranial abnormality. No skull fracture.
2. Mild atrophy and chronic small vessel ischemia.

## 2019-11-07 IMAGING — DX DG KNEE COMPLETE 4+V*L*
4 series · 4 of 4 positions shown · non-contrast
Comparison: None.

CLINICAL DATA: Fall, evaluate for fracture

EXAM:
LEFT KNEE - COMPLETE 4+ VIEW

[knee ap]
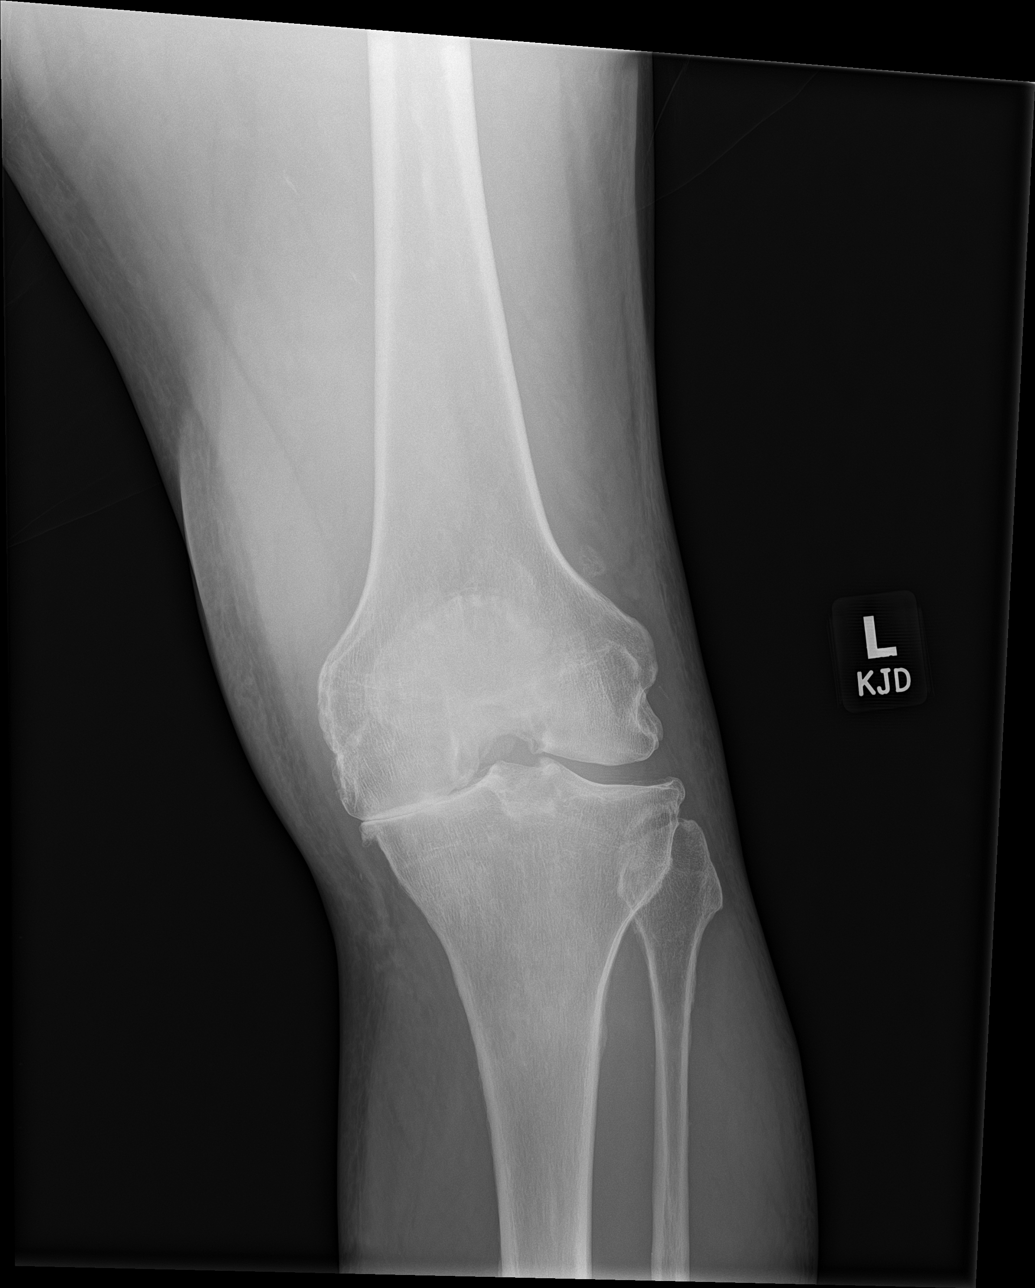

[knee lat]
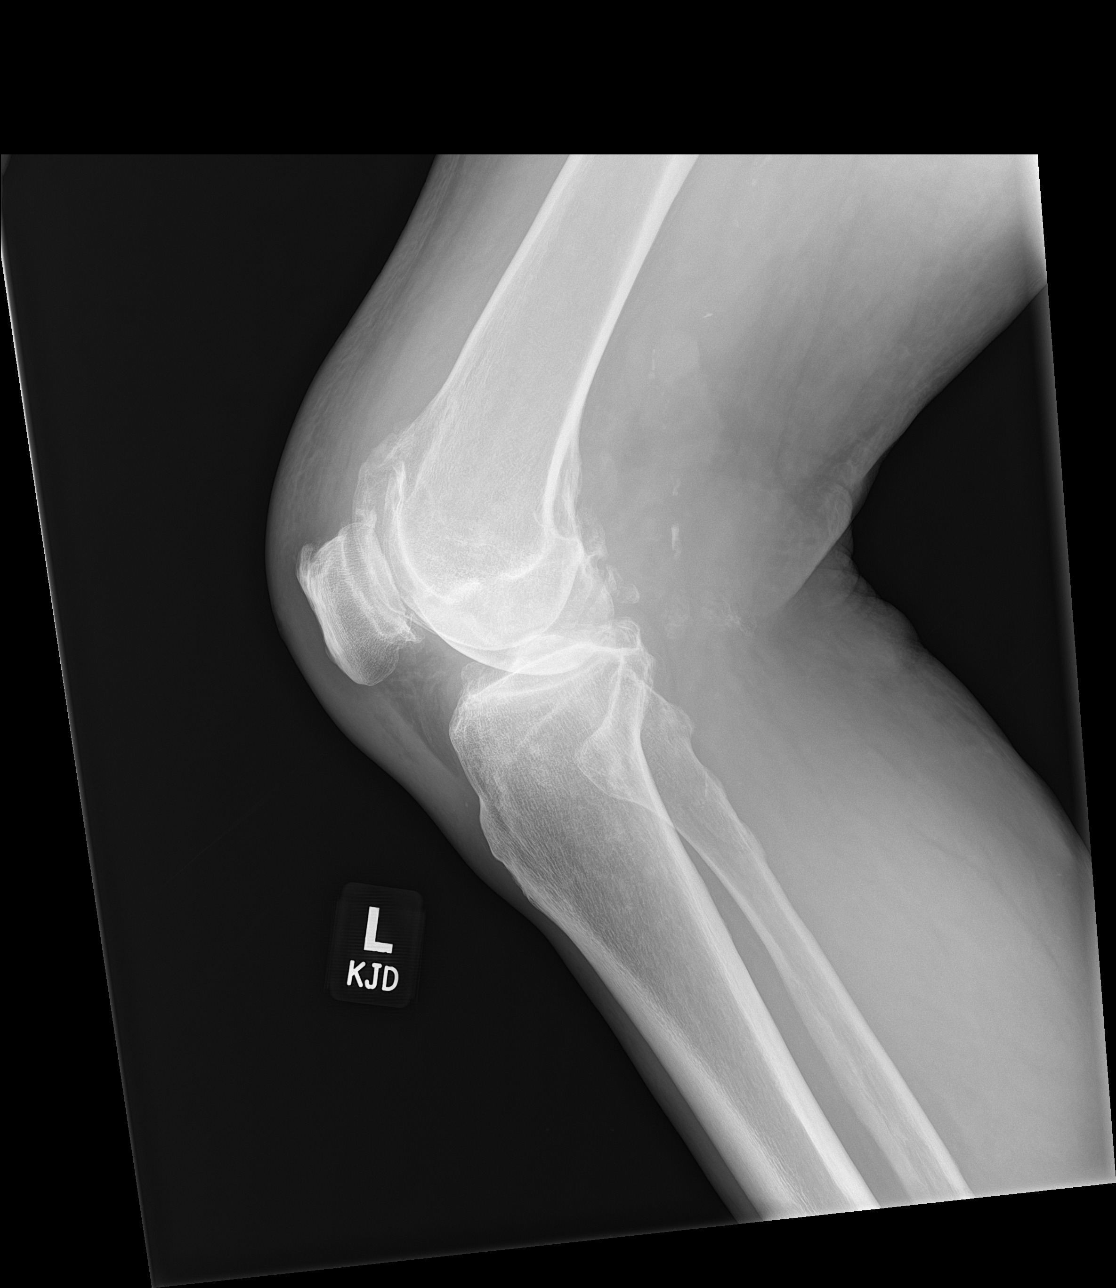

[knee obl (1 of 2)]
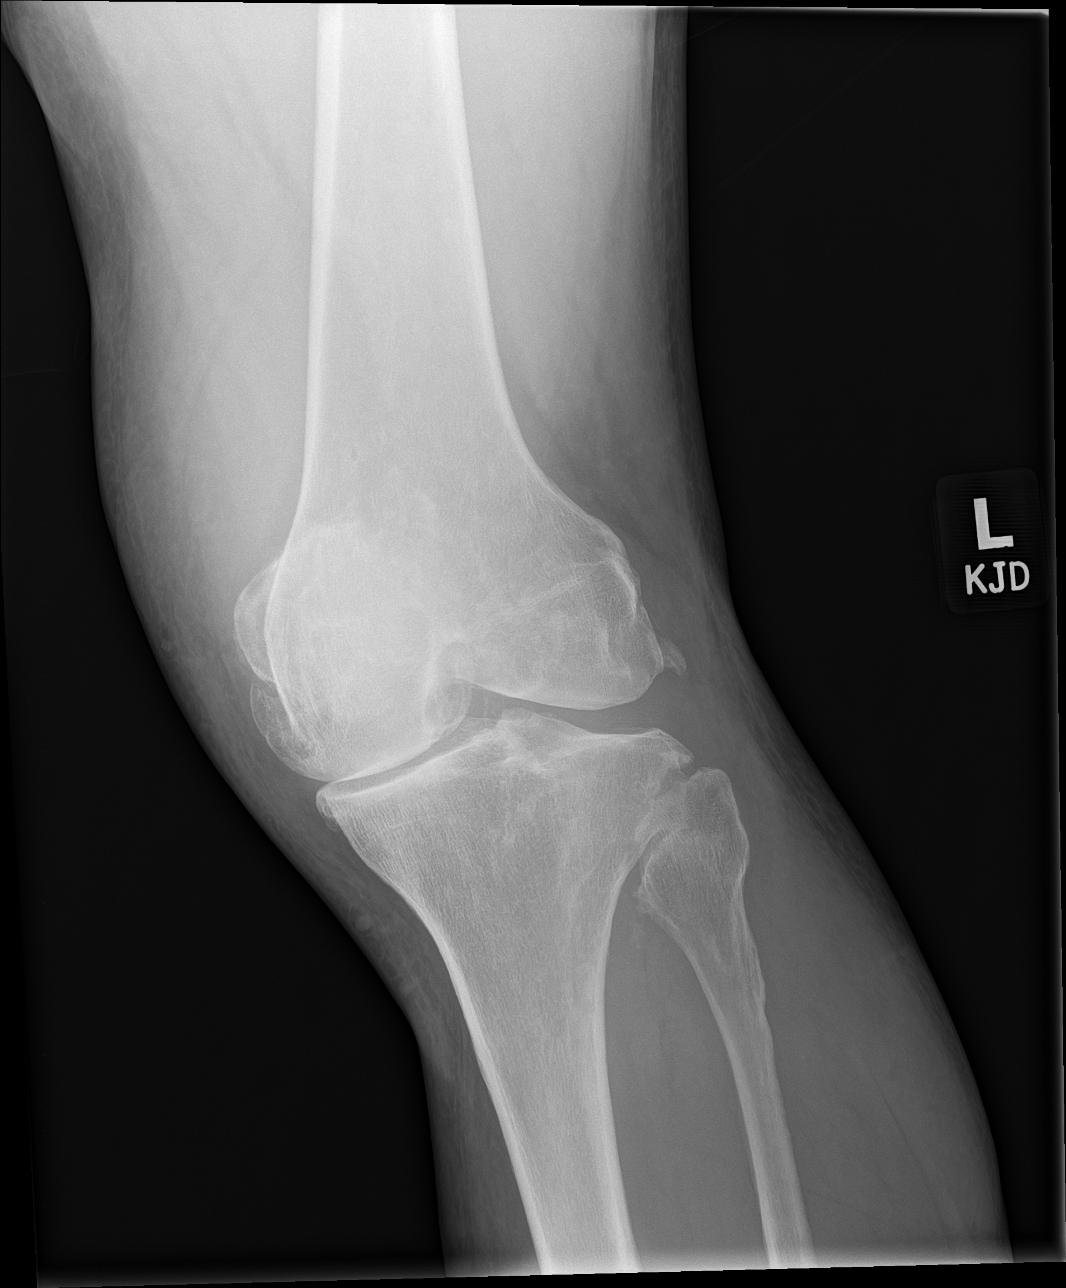

[knee obl (2 of 2)]
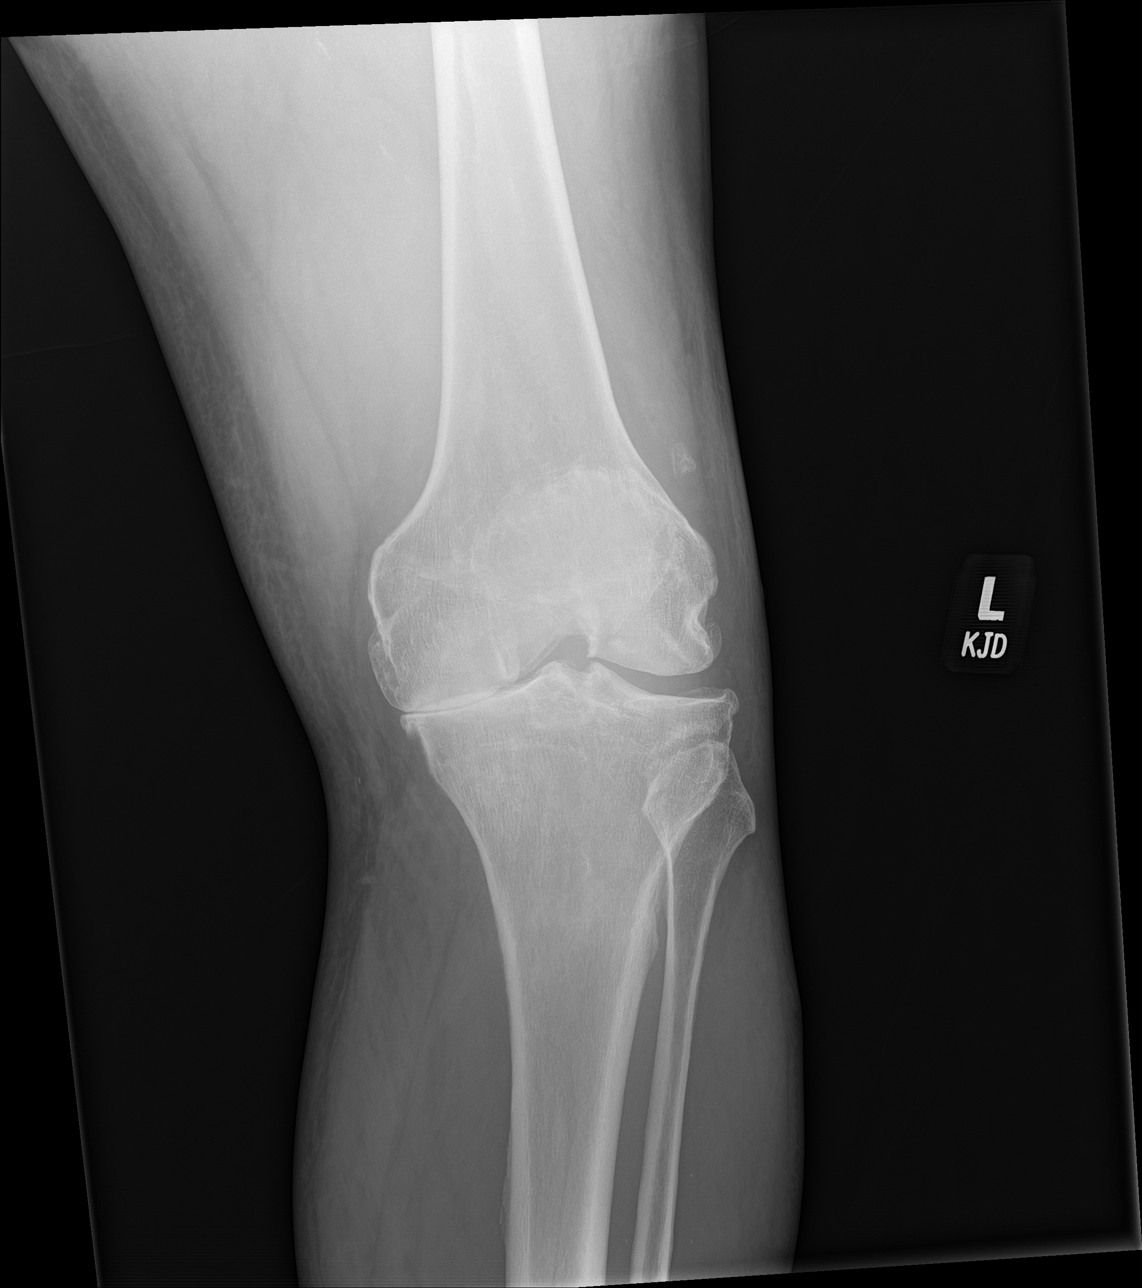

[4 of 4 positions shown; findings below may reference images not displayed]

FINDINGS: Advanced tricompartment degenerative changes with joint space loss
and spurring, most pronounced in the medial and patellofemoral
compartments. No acute bony abnormality. Specifically, no fracture,
subluxation, or dislocation. Anterior soft tissue swelling. No
visible joint effusion.
IMPRESSION: Advanced tricompartment degenerative changes. Anterior soft tissue
swelling. No acute bony abnormality.

## 2019-11-07 SURGERY — ARTHROPLASTY, KNEE, TOTAL
Anesthesia: Spinal | Site: Knee | Laterality: Right

## 2019-11-07 MED ORDER — IOHEXOL 350 MG/ML SOLN
100.0000 mL | Freq: Once | INTRAVENOUS | Status: AC | PRN
  Administered 2019-11-07: 100 mL via INTRAVENOUS

## 2019-11-07 MED ORDER — PREDNISONE 20 MG PO TABS: 60.0000 mg | ORAL_TABLET | Freq: Once | ORAL | Status: DC

## 2019-11-07 MED ORDER — ALBUTEROL SULFATE HFA 108 (90 BASE) MCG/ACT IN AERS: 2.0000 | INHALATION_SPRAY | Freq: Once | RESPIRATORY_TRACT | Status: DC

## 2019-11-07 MED ORDER — TETANUS-DIPHTH-ACELL PERTUSSIS 5-2.5-18.5 LF-MCG/0.5 IM SUSP
0.5000 mL | Freq: Once | INTRAMUSCULAR | Status: AC
  Administered 2019-11-07: 0.5 mL via INTRAMUSCULAR
  Filled 2019-11-07: qty 0.5

## 2019-11-07 MED ORDER — PREDNISONE 20 MG PO TABS: ORAL_TABLET | ORAL | 0 refills | Status: DC

## 2019-11-07 NOTE — ED Notes (Signed)
Patient verbalizes understanding of discharge instructions. Opportunity for questioning and answers were provided. Armband removed by staff, pt discharged from ED. Pt. ambulatory and discharged home.  

## 2019-11-07 NOTE — ED Triage Notes (Signed)
Pt came in [facility] with c/o of AMS that started approx 2hrs. Ago. Pt is able to follow commands and sometimes respond verbally. Pt also  experienced an unwittnessed fall tonight while he was outside blowing leaves. Pt left knee is scrapped, bleeding controled.   [id number], 18GIV LHand

## 2019-11-07 NOTE — ED Notes (Signed)
Pt ambulated to bathroom with walker.

## 2019-11-07 NOTE — ED Provider Notes (Signed)
Emergency Department Provider Note   I have reviewed the triage vital signs and the nursing notes.   HISTORY  Chief Complaint Altered Mental Status and Fall   HPI Kent Flowers is a 73 y.o. male with medical problems as documented below who presents the emergency department today with altered mental status.  Is unclear what his actual altered mental status is as when I evaluate him he is alert and oriented.  Says he had a fall earlier today unknown if he hit his head.  He does not remember it but does note he has some left knee pain afterwards.   He does not complain of any chest pain, head pain, fevers, cough or other associated symptoms.  Patient does have history of COPD and he states that his breathing is relatively normal for him.  No other associated or modifying symptoms.    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 young (pulmologist)   LLL and LUL-- per last CT 10-01-2013  stable and previous right lung nodule not seen   Thoracic ascending aortic aneurysm ([place])    STABLE PER LAST CT 10-01-2013  4CM--  ECTATIC    Urethral stricture    Urgency of urination    Wears glasses     Patient Active Problem List   Diagnosis Date Noted   Insomnia 09/22/2019   Neck pain 11/04/2017   Other social stressor 07/19/2016   COPD with acute bronchitis  ([place]) 10/04/2014   Hepatitis, unspecified 03/13/2014   Chronic low back pain    Knee pain, bilateral    Right knee DJD    Hepatitis 11/03/2013   Ectatic thoracic aorta ([place]) 11/03/2013   Aortic calcification ([place]) 11/03/2013   Coronary atherosclerosis of native coronary artery 11/03/2013   Penile neoplasm    Ascending aortic aneurysm ([place])    Lung nodule 05/23/2012   COPD mixed type ([place]) 02/24/2012    Past Surgical History:  Procedure Laterality Date   CYSTOSCOPY WITH BIOPSY N/A 04/10/2017   Procedure: POSSIBLE BLADDER  BIOPSY;  Surgeon: Festus Aloe, MD;  Location: [facility];  Service: Urology;  Laterality: N/A;   CYSTOSCOPY WITH URETHRAL DILATATION N/A 04/10/2017   Procedure: CYSTOSCOPY WITH BALLOON URETHRALSTRICTURE DILATATION;  Surgeon: Festus Aloe, MD;  Location: [facility];  Service: Urology;  Laterality: N/A;   PENILE BIOPSY N/A 03/14/2013   Procedure: PENILE BIOPSY;  Surgeon: Fredricka Bonine, MD;  Location: [facility];  Service: Urology;  Laterality: N/A;   REATTACHMENT LEFT INDEX FINGER  1988   SHOULDER ARTHROSCOPY WITH ROTATOR CUFF REPAIR AND SUBACROMIAL DECOMPRESSION Left 2004   TONSILLECTOMY  AS CHILD    Current Outpatient Rx   Order #: [id number] Class: Normal   Order #: [id number] Class: Historical Med  Order #: [id number] Class: Historical Med   Order #: [id number] Class: Historical Med   Order #: [id number] Class: Historical Med   Order #: [id number] Class: Historical Med   Order #: [id number] Class: Historical Med   Order #: [id number] Class: Historical Med   Order #: [id number] Class: Print    Allergies Ceclor [cefaclor] and Sulfa antibiotics  Family History  Problem Relation Age of Onset   Emphysema Mother    Cancer Father        bile duct    Social History Social History   Tobacco Use   Smoking status: Former Smoker    Packs/day: 1.00    Years: 12.00    Pack years:  12.00    Types: Cigarettes    Quit date: 12/18/2009    Years since quitting: 9.8   Smokeless tobacco: Never Used  Substance Use Topics   Alcohol use: Yes    Alcohol/week: 3.0 standard drinks    Types: 3 Glasses of wine per week   Drug use: No    Review of Systems  All other systems negative except as documented in the HPI. All pertinent positives and negatives as reviewed in the HPI. ____________________________________________   PHYSICAL EXAM:  VITAL SIGNS: ED Triage Vitals  Enc Vitals Group     BP 11/07/19 0159 140/80     Pulse Rate 11/07/19 0159 (!) 106     Resp 11/07/19 0215 (!) 23     Temp 11/07/19 0159 100.1 F (37.8 C)     Temp Source 11/07/19 0159 Rectal     SpO2 11/07/19 0159 98 %     Weight 11/07/19 0200 170 lb (77.1 kg)     Height 11/07/19 0200 5\' 8"  (1.727 m)    Constitutional: Alert and oriented. Well appearing and in no acute distress. Eyes: Conjunctivae are normal. PERRL. EOMI. Head: Atraumatic. Nose: No congestion/rhinnorhea. Mouth/Throat: Mucous membranes are moist.  Oropharynx non-erythematous. Neck: No stridor.  No meningeal signs.   Cardiovascular: Normal rate, regular rhythm. Good peripheral circulation. Grossly normal heart sounds.   Respiratory: tachypneic respiratory effort.  No retractions. Lungs with wheezing. Gastrointestinal: Soft and nontender. No distention.  Musculoskeletal: No lower extremity tenderness nor edema. No gross deformities of extremities. Neurologic:  Normal speech and language. No gross focal neurologic deficits are appreciated.  Skin:  Small well approximated hemostatic laceration to left knee with associated ttp and pain with ROM.   ____________________________________________   LABS (all labs ordered are listed, but only abnormal results are displayed)  Labs Reviewed  AMMONIA - Abnormal; Notable for the following components:      Result Value   Ammonia 39 (*)    All other components within normal limits    COMPREHENSIVE METABOLIC PANEL - Abnormal; Notable for the following components:   Glucose, Bld 123 (*)    GFR calc non Af Amer 58 (*)    All other components within normal limits  RAPID URINE DRUG SCREEN, HOSP PERFORMED - Abnormal; Notable for the following components:   Opiates POSITIVE (*)    Tetrahydrocannabinol POSITIVE (*)    All other components within normal limits  ETHANOL - Abnormal; Notable for the following components:   Alcohol, Ethyl (B) 29 (*)    All other components within normal limits  POCT I-STAT 7, (LYTES, BLD GAS, ICA,H+H) - Abnormal; Notable for the following components:   pH, Arterial 7.324 (*)    pCO2 arterial 53.5 (*)    pO2, Arterial 72.0 (*)    All other components within normal limits  CULTURE,  BLOOD (ROUTINE X 2)  CULTURE, BLOOD (ROUTINE X 2)  URINE CULTURE  LACTIC ACID, PLASMA  CBC WITH DIFFERENTIAL/PLATELET  URINALYSIS, COMPLETE (UACMP) WITH MICROSCOPIC  I-STAT VENOUS BLOOD GAS, ED   ____________________________________________  EKG   EKG Interpretation  Date/Time:    Ventricular Rate:    PR Interval:    QRS Duration:   QT Interval:    QTC Calculation:   R Axis:     Text Interpretation:         ____________________________________________  RADIOLOGY  Ct Head Wo Contrast  Result Date: 11/07/2019 CLINICAL DATA:  Head trauma, minor, GCS>=13, high clinical risk, initial exam Altered mental status. EXAM: CT HEAD WITHOUT CONTRAST TECHNIQUE: Contiguous axial images were obtained from the base of the skull through the vertex without intravenous contrast. COMPARISON:  None. FINDINGS: Brain: No intracranial hemorrhage, mass effect, or midline shift. Mild atrophy and chronic small vessel ischemia. No hydrocephalus. The basilar cisterns are patent. No evidence of territorial infarct or acute ischemia. No extra-axial or intracranial fluid collection. Vascular: Atherosclerosis of skullbase vasculature without hyperdense vessel or abnormal  calcification. Skull: No fracture or focal lesion. Sinuses/Orbits: Mucosal thickening of the paranasal sinuses without fluid level. No acute fracture. The mastoid air cells are clear. Other: None. IMPRESSION: 1. No acute intracranial abnormality. No skull fracture. 2. Mild atrophy and chronic small vessel ischemia. Electronically Signed   By: Keith Rake M.D.   On: 11/07/2019 02:50   Ct Angio Chest Pe W And/or Wo Contrast  Result Date: 11/07/2019 CLINICAL DATA:  PE suspected, high pretest prob. Shortness of breath and hypoxia. EXAM: CT ANGIOGRAPHY CHEST WITH CONTRAST TECHNIQUE: Multidetector CT imaging of the chest was performed using the standard protocol during bolus administration of intravenous contrast. Multiplanar CT image reconstructions and MIPs were obtained to evaluate the vascular anatomy. CONTRAST:  161mL OMNIPAQUE IOHEXOL 350 MG/ML SOLN COMPARISON:  Radiograph earlier this day. Chest CT 10/01/2013 FINDINGS: Cardiovascular: There are no filling defects within the pulmonary arteries to suggest pulmonary embolus. Moderate calcified and noncalcified atheromatous plaque throughout the thoracic aorta without dissection or evidence of acute aortic abnormality. Left vertebral artery arises directly from the thoracic aorta, variant arch anatomy. Aneurysmal dilatation of the ascending aorta measuring 4.4 cm, previously 4.0 cm. Coronary artery calcifications. Heart is normal in size. No pericardial effusion. Mediastinum/Nodes: Small mediastinal and hilar nodes not enlarged by size criteria. Decompressed esophagus. No visualized thyroid nodule. Lungs/Pleura: Emphysema. Relatively severe bronchial thickening with areas of mucous plugging. Filling defects adherent to the tracheobronchial walls in the trachea, right mainstem, right bronchus intermedius and left upper lobe bronchus, some of which appear nodular. Scattered tiny pulmonary nodules as well as calcified granuloma, unchanged from 2014 exam and  considered benign. No confluent airspace disease. No pleural fluid. No pulmonary edema. No pulmonary mass. Upper Abdomen: Common bile duct dilatation, partially included. This appears similar to 02/13/2017 abdominal CT. No abnormal gallbladder distention. Musculoskeletal: Multilevel degenerative change throughout the thoracic spine. There are no acute or suspicious osseous abnormalities. Review of the MIP images confirms the above findings. IMPRESSION: 1. No pulmonary embolus or aortic dissection. 2. Ascending thoracic aortic aneurysm, maximal dimension 4.4 cm. Recommend annual imaging followup by CTA or MRA. This recommendation follows 2010 ACCF/AHA/AATS/ACR/ASA/SCA/SCAI/SIR/STS/SVM Guidelines for the Diagnosis and Management of Patients with Thoracic Aortic Disease. Circulation. 2010; [id number]. Aortic aneurysm NOS (ICD10-I71.9) 3. Emphysema. Advanced bronchial thickening with areas of mucous plugging. Filling defects adherent to the tracheobronchial walls in the trachea, some of which appear nodular. Findings  may be due to adherent mucus, however soft tissue nodule such is papillomatosis could have a similar appearance. 4. Chronic extrahepatic biliary ductal dilatation, unchanged from February 2018 abdominal CT. Aortic Atherosclerosis (ICD10-I70.0) and Emphysema (ICD10-J43.9). Electronically Signed   By: Keith Rake M.D.   On: 11/07/2019 04:39   Dg Chest Portable 1 View  Result Date: 11/07/2019 CLINICAL DATA:  Shortness of breath, hypoxia EXAM: PORTABLE CHEST 1 VIEW COMPARISON:  09/22/2019 FINDINGS: Heart and mediastinal contours are within normal limits. No focal opacities or effusions. No acute bony abnormality. IMPRESSION: No active disease. Electronically Signed   By: Rolm Baptise M.D.   On: 11/07/2019 02:37   Dg Knee Complete 4 Views Left  Result Date: 11/07/2019 CLINICAL DATA:  Fall, evaluate for fracture EXAM: LEFT KNEE - COMPLETE 4+ VIEW COMPARISON:  None. FINDINGS: Advanced  tricompartment degenerative changes with joint space loss and spurring, most pronounced in the medial and patellofemoral compartments. No acute bony abnormality. Specifically, no fracture, subluxation, or dislocation. Anterior soft tissue swelling. No visible joint effusion. IMPRESSION: Advanced tricompartment degenerative changes. Anterior soft tissue swelling. No acute bony abnormality. Electronically Signed   By: Rolm Baptise M.D.   On: 11/07/2019 02:38    ____________________________________________   PROCEDURES  Procedure(s) performed:   Procedures   ____________________________________________   INITIAL IMPRESSION / ASSESSMENT AND PLAN / ED COURSE  Patient persistently alert and oriented able to tell the many stories about his past and recent going on's.  Ambulate without difficulty.  He is consistently off of oxygen with saturations in the 90 to 95% range on my evaluation.  CT scan negative for PE.  Will treat for COPD exacerbation.  Unclear what this is all to muscles may have been.  It could have been a seizure with a postictal state however has a negative head CT.  Could be near syncopal episodes.  However work-up here is overall unremarkable no indication for further imaging/work-up or hospitalization at this time.     Pertinent labs & imaging results that were available during my care of the patient were reviewed by me and considered in my medical decision making (see chart for details).   A medical screening exam was performed and I feel the patient has had an appropriate workup for their chief complaint at this time and likelihood of emergent condition existing is low. They have been counseled on decision, discharge, follow up and which symptoms necessitate immediate return to the emergency department. They or their family verbally stated understanding and agreement with plan and discharged in stable condition.   ____________________________________________  FINAL CLINICAL  IMPRESSION(S) / ED DIAGNOSES  Final diagnoses:  Altered mental status, unspecified altered mental status type  Laceration of left knee, initial encounter     MEDICATIONS GIVEN DURING THIS VISIT:  Medications  predniSONE (DELTASONE) tablet 60 mg (has no administration in time range)  albuterol (VENTOLIN HFA) 108 (90 Base) MCG/ACT inhaler 2 puff (has no administration in time range)  Tdap (BOOSTRIX) injection 0.5 mL (0.5 mLs Intramuscular Given 11/07/19 0218)  iohexol (OMNIPAQUE) 350 MG/ML injection 100 mL (100 mLs Intravenous Contrast Given 11/07/19 0412)     NEW OUTPATIENT MEDICATIONS STARTED DURING THIS VISIT:  Discharge Medication List as of 11/07/2019  5:47 AM    START taking these medications   Details  predniSONE (DELTASONE) 20 MG tablet 2 tabs po daily x 4 days, Print        Note:  This note was prepared with assistance of Dragon voice recognition software.  Occasional wrong-word or sound-a-like substitutions may have occurred due to the inherent limitations of voice recognition software.   Merrily Pew, MD 11/07/19 [phone]

## 2019-11-08 LAB — URINE CULTURE: Culture: NO GROWTH

## 2019-11-12 LAB — CULTURE, BLOOD (ROUTINE X 2)
Culture: NO GROWTH
Culture: NO GROWTH
Special Requests: ADEQUATE

## 2019-12-01 DIAGNOSIS — M1711 Unilateral primary osteoarthritis, right knee: Secondary | ICD-10-CM | POA: Diagnosis not present

## 2019-12-01 DIAGNOSIS — G894 Chronic pain syndrome: Secondary | ICD-10-CM | POA: Diagnosis not present

## 2019-12-01 DIAGNOSIS — M19011 Primary osteoarthritis, right shoulder: Secondary | ICD-10-CM | POA: Diagnosis not present

## 2019-12-01 DIAGNOSIS — M1712 Unilateral primary osteoarthritis, left knee: Secondary | ICD-10-CM | POA: Diagnosis not present

## 2019-12-02 DIAGNOSIS — G894 Chronic pain syndrome: Secondary | ICD-10-CM | POA: Diagnosis not present

## 2019-12-02 DIAGNOSIS — Z79899 Other long term (current) drug therapy: Secondary | ICD-10-CM | POA: Diagnosis not present

## 2019-12-02 DIAGNOSIS — I10 Essential (primary) hypertension: Secondary | ICD-10-CM | POA: Diagnosis not present

## 2019-12-02 DIAGNOSIS — M1712 Unilateral primary osteoarthritis, left knee: Secondary | ICD-10-CM | POA: Diagnosis not present

## 2019-12-09 DIAGNOSIS — H6123 Impacted cerumen, bilateral: Secondary | ICD-10-CM | POA: Diagnosis not present

## 2019-12-09 DIAGNOSIS — H608X3 Other otitis externa, bilateral: Secondary | ICD-10-CM | POA: Diagnosis not present

## 2019-12-11 DIAGNOSIS — I10 Essential (primary) hypertension: Secondary | ICD-10-CM | POA: Diagnosis not present

## 2019-12-11 DIAGNOSIS — M1712 Unilateral primary osteoarthritis, left knee: Secondary | ICD-10-CM | POA: Diagnosis not present

## 2019-12-11 DIAGNOSIS — M5430 Sciatica, unspecified side: Secondary | ICD-10-CM | POA: Diagnosis not present

## 2019-12-11 DIAGNOSIS — Z79899 Other long term (current) drug therapy: Secondary | ICD-10-CM | POA: Diagnosis not present

## 2019-12-14 ENCOUNTER — Emergency Department (HOSPITAL_COMMUNITY)
Admission: EM | Admit: 2019-12-14 | Discharge: 2019-12-14 | Disposition: A | Discharge status: Home / Self Care | Payer: Medicare Other | Attending: Emergency Medicine | Admitting: Emergency Medicine

## 2019-12-14 ENCOUNTER — Emergency Department (HOSPITAL_COMMUNITY): Payer: Medicare Other

## 2019-12-14 ENCOUNTER — Other Ambulatory Visit: Payer: Self-pay

## 2019-12-14 ENCOUNTER — Encounter (HOSPITAL_COMMUNITY): Payer: Self-pay | Admitting: Emergency Medicine

## 2019-12-14 DIAGNOSIS — Z85828 Personal history of other malignant neoplasm of skin: Secondary | ICD-10-CM | POA: Insufficient documentation

## 2019-12-14 DIAGNOSIS — R0789 Other chest pain: Secondary | ICD-10-CM | POA: Diagnosis not present

## 2019-12-14 DIAGNOSIS — J449 Chronic obstructive pulmonary disease, unspecified: Secondary | ICD-10-CM | POA: Insufficient documentation

## 2019-12-14 DIAGNOSIS — R079 Chest pain, unspecified: Secondary | ICD-10-CM | POA: Diagnosis not present

## 2019-12-14 DIAGNOSIS — Y998 Other external cause status: Secondary | ICD-10-CM | POA: Diagnosis not present

## 2019-12-14 DIAGNOSIS — I2510 Atherosclerotic heart disease of native coronary artery without angina pectoris: Secondary | ICD-10-CM | POA: Diagnosis not present

## 2019-12-14 DIAGNOSIS — Y9389 Activity, other specified: Secondary | ICD-10-CM | POA: Diagnosis not present

## 2019-12-14 DIAGNOSIS — Z87891 Personal history of nicotine dependence: Secondary | ICD-10-CM | POA: Diagnosis not present

## 2019-12-14 DIAGNOSIS — Y92018 Other place in single-family (private) house as the place of occurrence of the external cause: Secondary | ICD-10-CM | POA: Insufficient documentation

## 2019-12-14 LAB — BASIC METABOLIC PANEL
Anion gap: 11 (ref 5–15)
BUN: 14 mg/dL (ref 8–23)
CO2: 25 mmol/L (ref 22–32)
Calcium: 8.7 mg/dL — ABNORMAL LOW (ref 8.9–10.3)
Chloride: 97 mmol/L — ABNORMAL LOW (ref 98–111)
Creatinine, Ser: 0.89 mg/dL (ref 0.61–1.24)
GFR calc Af Amer: >60 mL/min (ref >60)
GFR calc non Af Amer: >60 mL/min (ref >60)
Glucose, Bld: 145 mg/dL — ABNORMAL HIGH (ref 70–99)
Potassium: 3.7 mmol/L (ref 3.5–5.1)
Sodium: 133 mmol/L — ABNORMAL LOW (ref 135–145)

## 2019-12-14 LAB — CBC
HCT: 50.6 % (ref 39.0–52.0)
Hemoglobin: 16.4 g/dL (ref 13.0–17.0)
MCH: 32 pg (ref 26.0–34.0)
MCHC: 32.4 g/dL (ref 30.0–36.0)
MCV: 98.6 fL (ref 80.0–100.0)
Platelets: 230 10*3/uL (ref 150–400)
RBC: 5.13 MIL/uL (ref 4.22–5.81)
RDW: 13.2 % (ref 11.5–15.5)
WBC: 17.8 10*3/uL — ABNORMAL HIGH (ref 4.0–10.5)
nRBC: 0 % (ref 0.0–0.2)

## 2019-12-14 LAB — TROPONIN I (HIGH SENSITIVITY): Troponin I (High Sensitivity): 7 ng/L (ref <18)
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
## 2019-12-14 MED ORDER — ALBUTEROL SULFATE HFA 108 (90 BASE) MCG/ACT IN AERS
2.0000 | INHALATION_SPRAY | Freq: Once | RESPIRATORY_TRACT | Status: AC
  Administered 2019-12-14: 2 via RESPIRATORY_TRACT
  Filled 2019-12-14: qty 6.7

## 2019-12-14 MED ORDER — LIDOCAINE 5 % EX PTCH
1.0000 | MEDICATED_PATCH | CUTANEOUS | Status: DC
  Administered 2019-12-14: 1 via TRANSDERMAL
  Filled 2019-12-14: qty 1

## 2019-12-14 MED ORDER — SODIUM CHLORIDE 0.9% FLUSH: 3.0000 mL | Freq: Once | INTRAVENOUS | Status: DC

## 2019-12-14 MED ORDER — LIDOCAINE 5 % EX PTCH: 1.0000 | MEDICATED_PATCH | CUTANEOUS | 0 refills | Status: DC

## 2019-12-14 MED ORDER — OXYCODONE HCL 5 MG PO TABS
5.0000 mg | ORAL_TABLET | Freq: Once | ORAL | Status: AC
  Administered 2019-12-14: 5 mg via ORAL
  Filled 2019-12-14: qty 1

## 2019-12-14 MED ORDER — OXYCODONE-ACETAMINOPHEN 5-325 MG PO TABS
1.0000 | ORAL_TABLET | Freq: Once | ORAL | Status: AC
  Administered 2019-12-14: 1 via ORAL
  Filled 2019-12-14: qty 1

## 2019-12-14 NOTE — ED Notes (Signed)
Pt ambulatory to the bathroom 
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
## 2019-12-14 NOTE — ED Notes (Signed)
Pt back from Xrays

## 2019-12-14 NOTE — ED Notes (Signed)
Patient transported to X-ray 

## 2019-12-14 NOTE — Discharge Instructions (Addendum)
Continue taking home medications as prescribed. Use Lidoderm patch for further pain control. Use ibuprofen for further pain management. Follow-up with your primary care doctor as needed for further pain management. Return to the emergency room if you have increased difficulty breathing or severe worsening chest pain. Return to the emergency room with any new, sudden, concerning symptoms.
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
## 2019-12-17 ENCOUNTER — Inpatient Hospital Stay (HOSPITAL_COMMUNITY)
Admission: EM | Admit: 2019-12-17 | Discharge: 2019-12-25 | DRG: 853 | Disposition: A | Discharge status: Skilled Nursing Facility | Payer: Medicare Other | Attending: Internal Medicine | Admitting: Internal Medicine

## 2019-12-17 DIAGNOSIS — S199XXA Unspecified injury of neck, initial encounter: Secondary | ICD-10-CM | POA: Diagnosis not present

## 2019-12-17 DIAGNOSIS — Z20822 Contact with and (suspected) exposure to covid-19: Secondary | ICD-10-CM | POA: Diagnosis present

## 2019-12-17 DIAGNOSIS — Z9889 Other specified postprocedural states: Secondary | ICD-10-CM | POA: Diagnosis not present

## 2019-12-17 DIAGNOSIS — M170 Bilateral primary osteoarthritis of knee: Secondary | ICD-10-CM | POA: Diagnosis present

## 2019-12-17 DIAGNOSIS — M545 Low back pain, unspecified: Secondary | ICD-10-CM | POA: Diagnosis present

## 2019-12-17 DIAGNOSIS — R0602 Shortness of breath: Secondary | ICD-10-CM | POA: Diagnosis not present

## 2019-12-17 DIAGNOSIS — Z86008 Personal history of in-situ neoplasm of other site: Secondary | ICD-10-CM | POA: Diagnosis not present

## 2019-12-17 DIAGNOSIS — R531 Weakness: Secondary | ICD-10-CM | POA: Diagnosis not present

## 2019-12-17 DIAGNOSIS — S299XXA Unspecified injury of thorax, initial encounter: Secondary | ICD-10-CM | POA: Diagnosis not present

## 2019-12-17 DIAGNOSIS — M549 Dorsalgia, unspecified: Secondary | ICD-10-CM | POA: Diagnosis not present

## 2019-12-17 DIAGNOSIS — B9561 Methicillin susceptible Staphylococcus aureus infection as the cause of diseases classified elsewhere: Secondary | ICD-10-CM | POA: Diagnosis not present

## 2019-12-17 DIAGNOSIS — A419 Sepsis, unspecified organism: Secondary | ICD-10-CM | POA: Diagnosis present

## 2019-12-17 DIAGNOSIS — Z87891 Personal history of nicotine dependence: Secondary | ICD-10-CM | POA: Diagnosis not present

## 2019-12-17 DIAGNOSIS — Z419 Encounter for procedure for purposes other than remedying health state, unspecified: Secondary | ICD-10-CM

## 2019-12-17 DIAGNOSIS — J440 Chronic obstructive pulmonary disease with acute lower respiratory infection: Secondary | ICD-10-CM | POA: Diagnosis not present

## 2019-12-17 DIAGNOSIS — J9600 Acute respiratory failure, unspecified whether with hypoxia or hypercapnia: Secondary | ICD-10-CM

## 2019-12-17 DIAGNOSIS — G92 Toxic encephalopathy: Secondary | ICD-10-CM | POA: Diagnosis present

## 2019-12-17 DIAGNOSIS — T4275XA Adverse effect of unspecified antiepileptic and sedative-hypnotic drugs, initial encounter: Secondary | ICD-10-CM | POA: Diagnosis present

## 2019-12-17 DIAGNOSIS — R739 Hyperglycemia, unspecified: Secondary | ICD-10-CM | POA: Diagnosis not present

## 2019-12-17 DIAGNOSIS — Z66 Do not resuscitate: Secondary | ICD-10-CM | POA: Diagnosis present

## 2019-12-17 DIAGNOSIS — G8929 Other chronic pain: Secondary | ICD-10-CM | POA: Diagnosis present

## 2019-12-17 DIAGNOSIS — A4101 Sepsis due to Methicillin susceptible Staphylococcus aureus: Secondary | ICD-10-CM | POA: Diagnosis not present

## 2019-12-17 DIAGNOSIS — K5900 Constipation, unspecified: Secondary | ICD-10-CM | POA: Diagnosis not present

## 2019-12-17 DIAGNOSIS — F329 Major depressive disorder, single episode, unspecified: Secondary | ICD-10-CM | POA: Diagnosis not present

## 2019-12-17 DIAGNOSIS — R7881 Bacteremia: Secondary | ICD-10-CM | POA: Diagnosis not present

## 2019-12-17 DIAGNOSIS — G061 Intraspinal abscess and granuloma: Secondary | ICD-10-CM | POA: Diagnosis not present

## 2019-12-17 DIAGNOSIS — J9690 Respiratory failure, unspecified, unspecified whether with hypoxia or hypercapnia: Secondary | ICD-10-CM

## 2019-12-17 DIAGNOSIS — Z881 Allergy status to other antibiotic agents status: Secondary | ICD-10-CM | POA: Diagnosis not present

## 2019-12-17 DIAGNOSIS — J9601 Acute respiratory failure with hypoxia: Secondary | ICD-10-CM | POA: Diagnosis not present

## 2019-12-17 DIAGNOSIS — N486 Induration penis plastica: Secondary | ICD-10-CM | POA: Diagnosis not present

## 2019-12-17 DIAGNOSIS — Z978 Presence of other specified devices: Secondary | ICD-10-CM

## 2019-12-17 DIAGNOSIS — M25511 Pain in right shoulder: Secondary | ICD-10-CM | POA: Diagnosis not present

## 2019-12-17 DIAGNOSIS — T85598A Other mechanical complication of other gastrointestinal prosthetic devices, implants and grafts, initial encounter: Secondary | ICD-10-CM

## 2019-12-17 DIAGNOSIS — R222 Localized swelling, mass and lump, trunk: Secondary | ICD-10-CM | POA: Diagnosis not present

## 2019-12-17 DIAGNOSIS — R202 Paresthesia of skin: Secondary | ICD-10-CM

## 2019-12-17 DIAGNOSIS — R509 Fever, unspecified: Secondary | ICD-10-CM | POA: Diagnosis not present

## 2019-12-17 DIAGNOSIS — G062 Extradural and subdural abscess, unspecified: Secondary | ICD-10-CM | POA: Diagnosis not present

## 2019-12-17 DIAGNOSIS — Z79899 Other long term (current) drug therapy: Secondary | ICD-10-CM

## 2019-12-17 DIAGNOSIS — M5440 Lumbago with sciatica, unspecified side: Secondary | ICD-10-CM | POA: Diagnosis not present

## 2019-12-17 DIAGNOSIS — J449 Chronic obstructive pulmonary disease, unspecified: Secondary | ICD-10-CM | POA: Diagnosis not present

## 2019-12-17 DIAGNOSIS — Z743 Need for continuous supervision: Secondary | ICD-10-CM | POA: Diagnosis not present

## 2019-12-17 DIAGNOSIS — J189 Pneumonia, unspecified organism: Secondary | ICD-10-CM | POA: Diagnosis not present

## 2019-12-17 DIAGNOSIS — R0902 Hypoxemia: Secondary | ICD-10-CM | POA: Diagnosis not present

## 2019-12-17 DIAGNOSIS — B9562 Methicillin resistant Staphylococcus aureus infection as the cause of diseases classified elsewhere: Secondary | ICD-10-CM | POA: Diagnosis not present

## 2019-12-17 DIAGNOSIS — R000 Tachycardia, unspecified: Secondary | ICD-10-CM | POA: Diagnosis not present

## 2019-12-17 DIAGNOSIS — N400 Enlarged prostate without lower urinary tract symptoms: Secondary | ICD-10-CM | POA: Diagnosis not present

## 2019-12-17 DIAGNOSIS — R29818 Other symptoms and signs involving the nervous system: Secondary | ICD-10-CM | POA: Diagnosis not present

## 2019-12-17 DIAGNOSIS — Z79891 Long term (current) use of opiate analgesic: Secondary | ICD-10-CM

## 2019-12-17 DIAGNOSIS — R52 Pain, unspecified: Secondary | ICD-10-CM | POA: Diagnosis not present
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
## 2019-12-18 ENCOUNTER — Inpatient Hospital Stay (HOSPITAL_COMMUNITY): Payer: Medicare Other | Admitting: Certified Registered"

## 2019-12-18 ENCOUNTER — Emergency Department (HOSPITAL_COMMUNITY): Payer: Medicare Other

## 2019-12-18 ENCOUNTER — Inpatient Hospital Stay (HOSPITAL_COMMUNITY): Payer: Medicare Other

## 2019-12-18 ENCOUNTER — Other Ambulatory Visit: Payer: Self-pay

## 2019-12-18 ENCOUNTER — Encounter (HOSPITAL_COMMUNITY): Payer: Self-pay | Admitting: Emergency Medicine

## 2019-12-18 ENCOUNTER — Encounter (HOSPITAL_COMMUNITY)
Admission: EM | Disposition: A | Discharge status: Skilled Nursing Facility | Payer: Self-pay | Source: Home / Self Care | Attending: Internal Medicine

## 2019-12-18 ENCOUNTER — Other Ambulatory Visit (HOSPITAL_COMMUNITY): Payer: Medicare Other

## 2019-12-18 DIAGNOSIS — Z87891 Personal history of nicotine dependence: Secondary | ICD-10-CM | POA: Diagnosis not present

## 2019-12-18 DIAGNOSIS — M8588 Other specified disorders of bone density and structure, other site: Secondary | ICD-10-CM | POA: Diagnosis not present

## 2019-12-18 DIAGNOSIS — R531 Weakness: Secondary | ICD-10-CM | POA: Diagnosis not present

## 2019-12-18 DIAGNOSIS — I700 Atherosclerosis of aorta: Secondary | ICD-10-CM | POA: Diagnosis not present

## 2019-12-18 DIAGNOSIS — B9562 Methicillin resistant Staphylococcus aureus infection as the cause of diseases classified elsewhere: Secondary | ICD-10-CM | POA: Diagnosis not present

## 2019-12-18 DIAGNOSIS — Z9889 Other specified postprocedural states: Secondary | ICD-10-CM | POA: Diagnosis not present

## 2019-12-18 DIAGNOSIS — M6281 Muscle weakness (generalized): Secondary | ICD-10-CM | POA: Diagnosis not present

## 2019-12-18 DIAGNOSIS — R202 Paresthesia of skin: Secondary | ICD-10-CM | POA: Diagnosis not present

## 2019-12-18 DIAGNOSIS — Z66 Do not resuscitate: Secondary | ICD-10-CM | POA: Diagnosis present

## 2019-12-18 DIAGNOSIS — R739 Hyperglycemia, unspecified: Secondary | ICD-10-CM | POA: Diagnosis present

## 2019-12-18 DIAGNOSIS — J9600 Acute respiratory failure, unspecified whether with hypoxia or hypercapnia: Secondary | ICD-10-CM | POA: Diagnosis not present

## 2019-12-18 DIAGNOSIS — R0602 Shortness of breath: Secondary | ICD-10-CM | POA: Diagnosis not present

## 2019-12-18 DIAGNOSIS — S199XXA Unspecified injury of neck, initial encounter: Secondary | ICD-10-CM | POA: Diagnosis not present

## 2019-12-18 DIAGNOSIS — M170 Bilateral primary osteoarthritis of knee: Secondary | ICD-10-CM | POA: Diagnosis present

## 2019-12-18 DIAGNOSIS — R29818 Other symptoms and signs involving the nervous system: Secondary | ICD-10-CM | POA: Diagnosis not present

## 2019-12-18 DIAGNOSIS — R918 Other nonspecific abnormal finding of lung field: Secondary | ICD-10-CM | POA: Diagnosis not present

## 2019-12-18 DIAGNOSIS — M5440 Lumbago with sciatica, unspecified side: Secondary | ICD-10-CM | POA: Diagnosis not present

## 2019-12-18 DIAGNOSIS — M545 Low back pain: Secondary | ICD-10-CM | POA: Diagnosis not present

## 2019-12-18 DIAGNOSIS — M549 Dorsalgia, unspecified: Secondary | ICD-10-CM | POA: Diagnosis not present

## 2019-12-18 DIAGNOSIS — A4101 Sepsis due to Methicillin susceptible Staphylococcus aureus: Secondary | ICD-10-CM | POA: Diagnosis not present

## 2019-12-18 DIAGNOSIS — Z4682 Encounter for fitting and adjustment of non-vascular catheter: Secondary | ICD-10-CM | POA: Diagnosis not present

## 2019-12-18 DIAGNOSIS — N400 Enlarged prostate without lower urinary tract symptoms: Secondary | ICD-10-CM | POA: Diagnosis present

## 2019-12-18 DIAGNOSIS — Z86008 Personal history of in-situ neoplasm of other site: Secondary | ICD-10-CM | POA: Diagnosis not present

## 2019-12-18 DIAGNOSIS — I739 Peripheral vascular disease, unspecified: Secondary | ICD-10-CM | POA: Diagnosis not present

## 2019-12-18 DIAGNOSIS — R2689 Other abnormalities of gait and mobility: Secondary | ICD-10-CM | POA: Diagnosis not present

## 2019-12-18 DIAGNOSIS — G062 Extradural and subdural abscess, unspecified: Secondary | ICD-10-CM | POA: Diagnosis not present

## 2019-12-18 DIAGNOSIS — A4901 Methicillin susceptible Staphylococcus aureus infection, unspecified site: Secondary | ICD-10-CM | POA: Diagnosis not present

## 2019-12-18 DIAGNOSIS — R509 Fever, unspecified: Secondary | ICD-10-CM | POA: Diagnosis not present

## 2019-12-18 DIAGNOSIS — A419 Sepsis, unspecified organism: Secondary | ICD-10-CM | POA: Diagnosis present

## 2019-12-18 DIAGNOSIS — G92 Toxic encephalopathy: Secondary | ICD-10-CM | POA: Diagnosis not present

## 2019-12-18 DIAGNOSIS — J449 Chronic obstructive pulmonary disease, unspecified: Secondary | ICD-10-CM | POA: Diagnosis not present

## 2019-12-18 DIAGNOSIS — B9561 Methicillin susceptible Staphylococcus aureus infection as the cause of diseases classified elsewhere: Secondary | ICD-10-CM | POA: Diagnosis not present

## 2019-12-18 DIAGNOSIS — Z20822 Contact with and (suspected) exposure to covid-19: Secondary | ICD-10-CM | POA: Diagnosis present

## 2019-12-18 DIAGNOSIS — G8929 Other chronic pain: Secondary | ICD-10-CM | POA: Diagnosis not present

## 2019-12-18 DIAGNOSIS — G061 Intraspinal abscess and granuloma: Secondary | ICD-10-CM

## 2019-12-18 DIAGNOSIS — Z743 Need for continuous supervision: Secondary | ICD-10-CM | POA: Diagnosis not present

## 2019-12-18 DIAGNOSIS — S299XXA Unspecified injury of thorax, initial encounter: Secondary | ICD-10-CM | POA: Diagnosis not present

## 2019-12-18 DIAGNOSIS — Z79891 Long term (current) use of opiate analgesic: Secondary | ICD-10-CM | POA: Diagnosis not present

## 2019-12-18 DIAGNOSIS — Z79899 Other long term (current) drug therapy: Secondary | ICD-10-CM | POA: Diagnosis not present

## 2019-12-18 DIAGNOSIS — Z981 Arthrodesis status: Secondary | ICD-10-CM | POA: Diagnosis not present

## 2019-12-18 DIAGNOSIS — G4700 Insomnia, unspecified: Secondary | ICD-10-CM | POA: Diagnosis not present

## 2019-12-18 DIAGNOSIS — J189 Pneumonia, unspecified organism: Secondary | ICD-10-CM | POA: Diagnosis not present

## 2019-12-18 DIAGNOSIS — R279 Unspecified lack of coordination: Secondary | ICD-10-CM | POA: Diagnosis not present

## 2019-12-18 DIAGNOSIS — M25511 Pain in right shoulder: Secondary | ICD-10-CM | POA: Diagnosis not present

## 2019-12-18 DIAGNOSIS — J9690 Respiratory failure, unspecified, unspecified whether with hypoxia or hypercapnia: Secondary | ICD-10-CM | POA: Diagnosis not present

## 2019-12-18 DIAGNOSIS — R000 Tachycardia, unspecified: Secondary | ICD-10-CM | POA: Diagnosis not present

## 2019-12-18 DIAGNOSIS — J440 Chronic obstructive pulmonary disease with acute lower respiratory infection: Secondary | ICD-10-CM | POA: Diagnosis present

## 2019-12-18 DIAGNOSIS — M75121 Complete rotator cuff tear or rupture of right shoulder, not specified as traumatic: Secondary | ICD-10-CM | POA: Diagnosis not present

## 2019-12-18 DIAGNOSIS — K5900 Constipation, unspecified: Secondary | ICD-10-CM | POA: Diagnosis not present

## 2019-12-18 DIAGNOSIS — R5381 Other malaise: Secondary | ICD-10-CM | POA: Diagnosis not present

## 2019-12-18 DIAGNOSIS — R7881 Bacteremia: Secondary | ICD-10-CM | POA: Diagnosis not present

## 2019-12-18 DIAGNOSIS — T4275XA Adverse effect of unspecified antiepileptic and sedative-hypnotic drugs, initial encounter: Secondary | ICD-10-CM | POA: Diagnosis present

## 2019-12-18 DIAGNOSIS — J9601 Acute respiratory failure with hypoxia: Secondary | ICD-10-CM | POA: Diagnosis not present

## 2019-12-18 HISTORY — PX: THORACIC LAMINECTOMY FOR EPIDURAL ABSCESS: SHX6115

## 2019-12-18 LAB — RESPIRATORY PANEL BY RT PCR (FLU A&B, COVID)
Influenza A by PCR: NEGATIVE
Influenza B by PCR: NEGATIVE
SARS Coronavirus 2 by RT PCR: NEGATIVE

## 2019-12-18 LAB — POCT I-STAT 7, (LYTES, BLD GAS, ICA,H+H)
Acid-Base Excess: 2 mmol/L (ref 0.0–2.0)
Bicarbonate: 26 mmol/L (ref 20.0–28.0)
Calcium, Ion: 1.12 mmol/L — ABNORMAL LOW (ref 1.15–1.40)
HCT: 36 % — ABNORMAL LOW (ref 39.0–52.0)
Hemoglobin: 12.2 g/dL — ABNORMAL LOW (ref 13.0–17.0)
O2 Saturation: 96 %
Patient temperature: 98.8
Potassium: 3.6 mmol/L (ref 3.5–5.1)
Sodium: 132 mmol/L — ABNORMAL LOW (ref 135–145)
TCO2: 27 mmol/L (ref 22–32)
pCO2 arterial: 38 mmHg (ref 32.0–48.0)
pH, Arterial: 7.444 (ref 7.350–7.450)
pO2, Arterial: 79 mmHg — ABNORMAL LOW (ref 83.0–108.0)

## 2019-12-18 LAB — CBC WITH DIFFERENTIAL/PLATELET
Abs Immature Granulocytes: 0.27 10*3/uL — ABNORMAL HIGH (ref 0.00–0.07)
Basophils Absolute: 0 10*3/uL (ref 0.0–0.1)
Basophils Relative: 0 %
Eosinophils Absolute: 0.1 10*3/uL (ref 0.0–0.5)
Eosinophils Relative: 0 %
HCT: 44.4 % (ref 39.0–52.0)
Hemoglobin: 14.9 g/dL (ref 13.0–17.0)
Immature Granulocytes: 1 %
Lymphocytes Relative: 3 %
Lymphs Abs: 0.7 10*3/uL (ref 0.7–4.0)
MCH: 31.8 pg (ref 26.0–34.0)
MCHC: 33.6 g/dL (ref 30.0–36.0)
MCV: 94.9 fL (ref 80.0–100.0)
Monocytes Absolute: 2.4 10*3/uL — ABNORMAL HIGH (ref 0.1–1.0)
Monocytes Relative: 10 %
Neutro Abs: 19.4 10*3/uL — ABNORMAL HIGH (ref 1.7–7.7)
Neutrophils Relative %: 86 %
Platelets: 207 10*3/uL (ref 150–400)
RBC: 4.68 MIL/uL (ref 4.22–5.81)
RDW: 13.2 % (ref 11.5–15.5)
WBC: 22.8 10*3/uL — ABNORMAL HIGH (ref 4.0–10.5)
nRBC: 0 % (ref 0.0–0.2)

## 2019-12-18 LAB — TYPE AND SCREEN
ABO/RH(D): O NEG
Antibody Screen: NEGATIVE

## 2019-12-18 LAB — URINALYSIS, ROUTINE W REFLEX MICROSCOPIC
Bacteria, UA: NONE SEEN
Bilirubin Urine: NEGATIVE
Glucose, UA: NEGATIVE mg/dL
Ketones, ur: 20 mg/dL — AB
Leukocytes,Ua: NEGATIVE
Nitrite: NEGATIVE
Protein, ur: 100 mg/dL — AB
Specific Gravity, Urine: 1.025 (ref 1.005–1.030)
pH: 6 (ref 5.0–8.0)

## 2019-12-18 LAB — COMPREHENSIVE METABOLIC PANEL
ALT: 27 U/L (ref 0–44)
AST: 38 U/L (ref 15–41)
Albumin: 3 g/dL — ABNORMAL LOW (ref 3.5–5.0)
Alkaline Phosphatase: 69 U/L (ref 38–126)
Anion gap: 14 (ref 5–15)
BUN: 25 mg/dL — ABNORMAL HIGH (ref 8–23)
CO2: 25 mmol/L (ref 22–32)
Calcium: 8.8 mg/dL — ABNORMAL LOW (ref 8.9–10.3)
Chloride: 94 mmol/L — ABNORMAL LOW (ref 98–111)
Creatinine, Ser: 1.04 mg/dL (ref 0.61–1.24)
GFR calc Af Amer: >60 mL/min (ref >60)
GFR calc non Af Amer: >60 mL/min (ref >60)
Glucose, Bld: 137 mg/dL — ABNORMAL HIGH (ref 70–99)
Potassium: 3.5 mmol/L (ref 3.5–5.1)
Sodium: 133 mmol/L — ABNORMAL LOW (ref 135–145)
Total Bilirubin: 1.2 mg/dL (ref 0.3–1.2)
Total Protein: 6.6 g/dL (ref 6.5–8.1)

## 2019-12-18 LAB — HEMOGLOBIN A1C
Hgb A1c MFr Bld: 5.3 % (ref 4.8–5.6)
Mean Plasma Glucose: 105.41 mg/dL

## 2019-12-18 LAB — SEDIMENTATION RATE: Sed Rate: 52 mm/hr — ABNORMAL HIGH (ref 0–16)

## 2019-12-18 LAB — LACTIC ACID, PLASMA
Lactic Acid, Venous: 1.4 mmol/L (ref 0.5–1.9)
Lactic Acid, Venous: 1.1 mmol/L (ref 0.5–1.9)

## 2019-12-18 LAB — BRAIN NATRIURETIC PEPTIDE: B Natriuretic Peptide: 74 pg/mL (ref 0.0–100.0)

## 2019-12-18 LAB — POC SARS CORONAVIRUS 2 AG -  ED: SARS Coronavirus 2 Ag: NEGATIVE

## 2019-12-18 LAB — MRSA PCR SCREENING: MRSA by PCR: NEGATIVE

## 2019-12-18 LAB — STREP PNEUMONIAE URINARY ANTIGEN: Strep Pneumo Urinary Antigen: NEGATIVE

## 2019-12-18 LAB — PROTIME-INR
INR: 1.3 — ABNORMAL HIGH (ref 0.8–1.2)
Prothrombin Time: 15.8 seconds — ABNORMAL HIGH (ref 11.4–15.2)

## 2019-12-18 LAB — APTT: aPTT: 42 seconds — ABNORMAL HIGH (ref 24–36)

## 2019-12-18 LAB — PHOSPHORUS: Phosphorus: 2.9 mg/dL (ref 2.5–4.6)

## 2019-12-18 LAB — ABO/RH: ABO/RH(D): O NEG

## 2019-12-18 LAB — C-REACTIVE PROTEIN: CRP: 30.5 mg/dL — ABNORMAL HIGH (ref <1.0)

## 2019-12-18 LAB — MAGNESIUM: Magnesium: 1.9 mg/dL (ref 1.7–2.4)

## 2019-12-18 IMAGING — DX DG ABDOMEN 1V
1 series · 1 of 1 positions shown · non-contrast
Comparison: None.

CLINICAL DATA: Impaired oral gastric feeding tube

EXAM:
ABDOMEN - 1 VIEW

[abdomen kub]
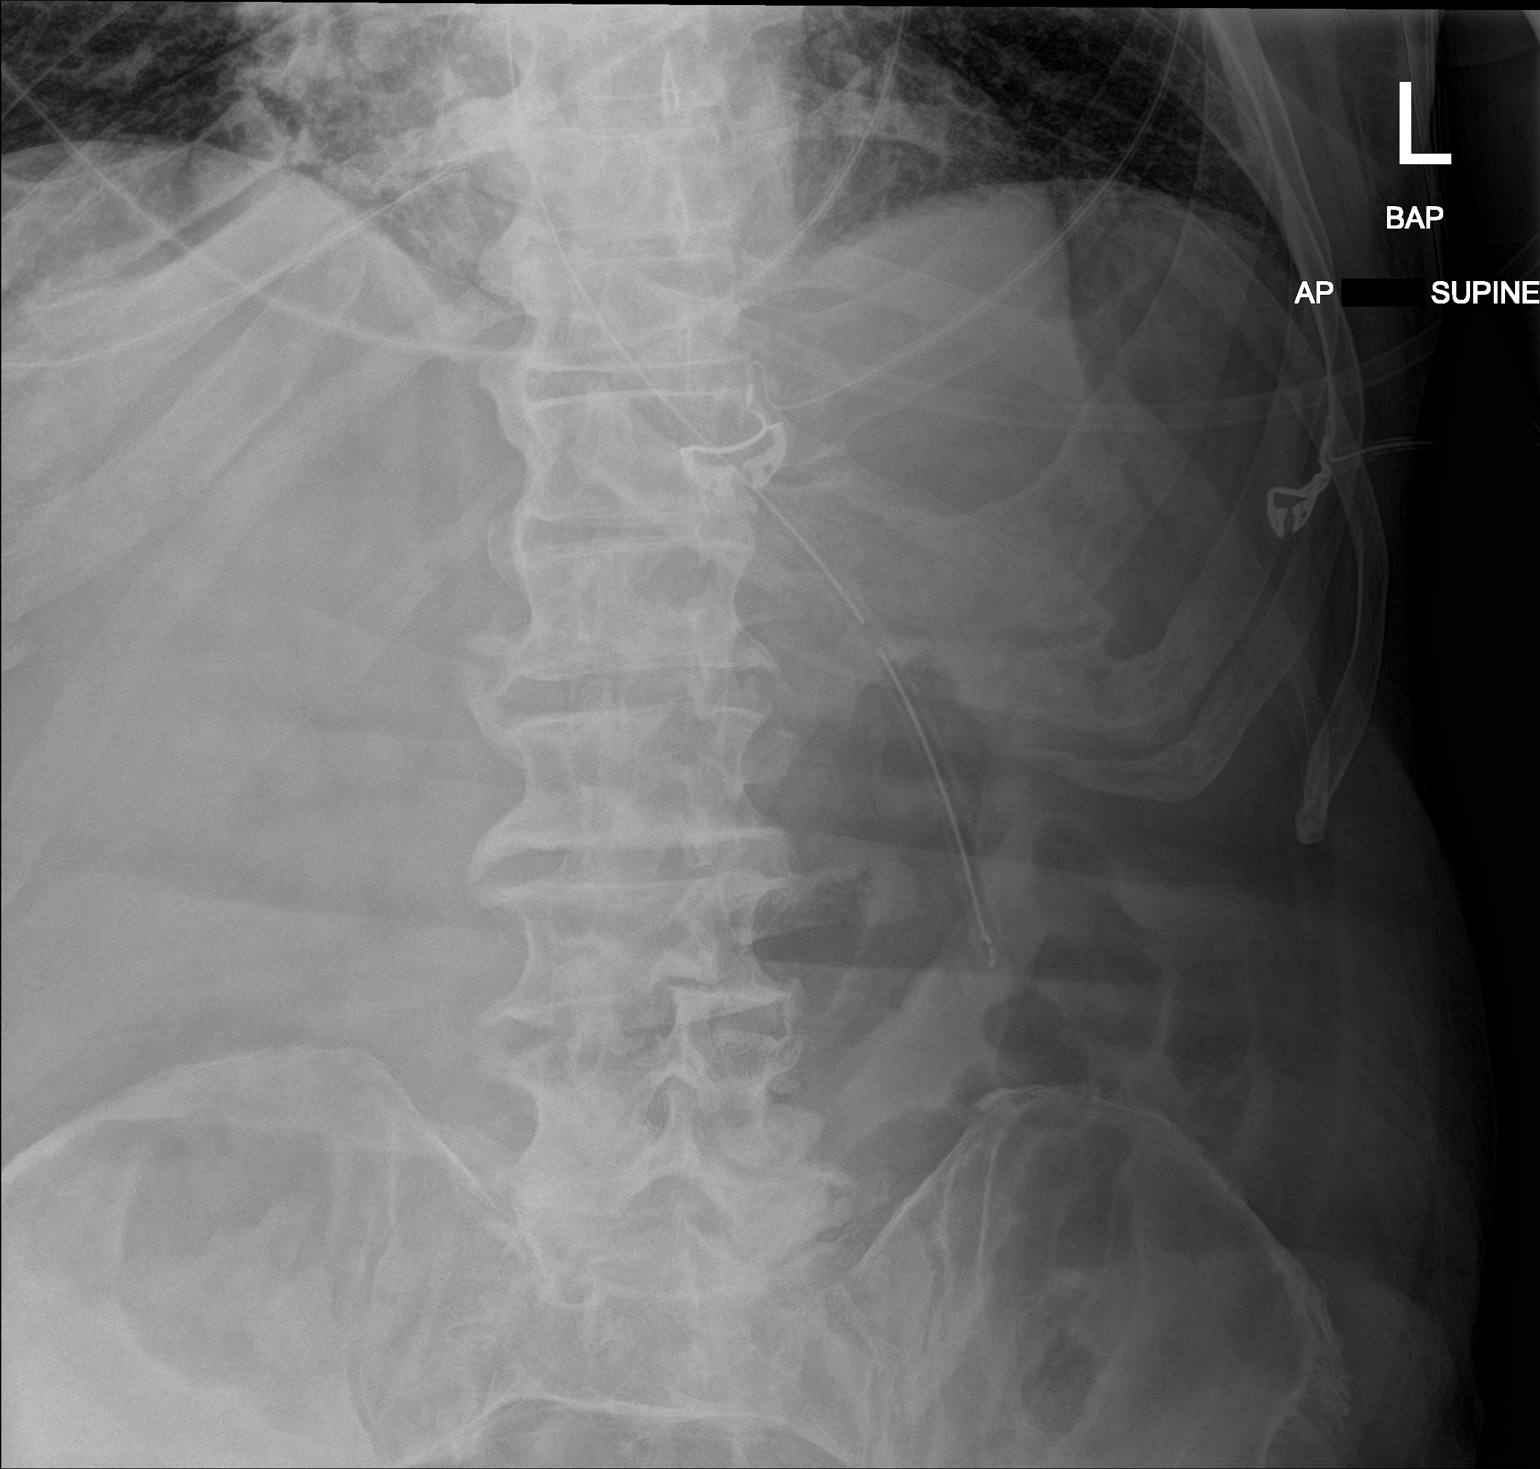

[1 of 1 positions shown; findings below may reference images not displayed]

FINDINGS: The nasogastric tube courses below the diaphragm with the side port
projecting over the stomach. No dilated loops of bowel to suggest
obstruction. No supine evidence for free air. Multilevel
degenerative changes in the lumbar spine.
IMPRESSION: 1. The nasogastric tube side port appropriately projects over the
stomach.

2.  Nonobstructive bowel gas pattern.
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
## 2019-12-18 IMAGING — RF DG THORACIC SPINE 1V
1 series · 5 of 5 positions shown · non-contrast
Comparison: [DATE]

CLINICAL DATA: Thoracic laminectomy for abscess.

EXAM:
OPERATIVE THORACIC SPINE 5 VIEW(S)

[Series 1: run · 5 of 5 slices shown]
[im 1/5]
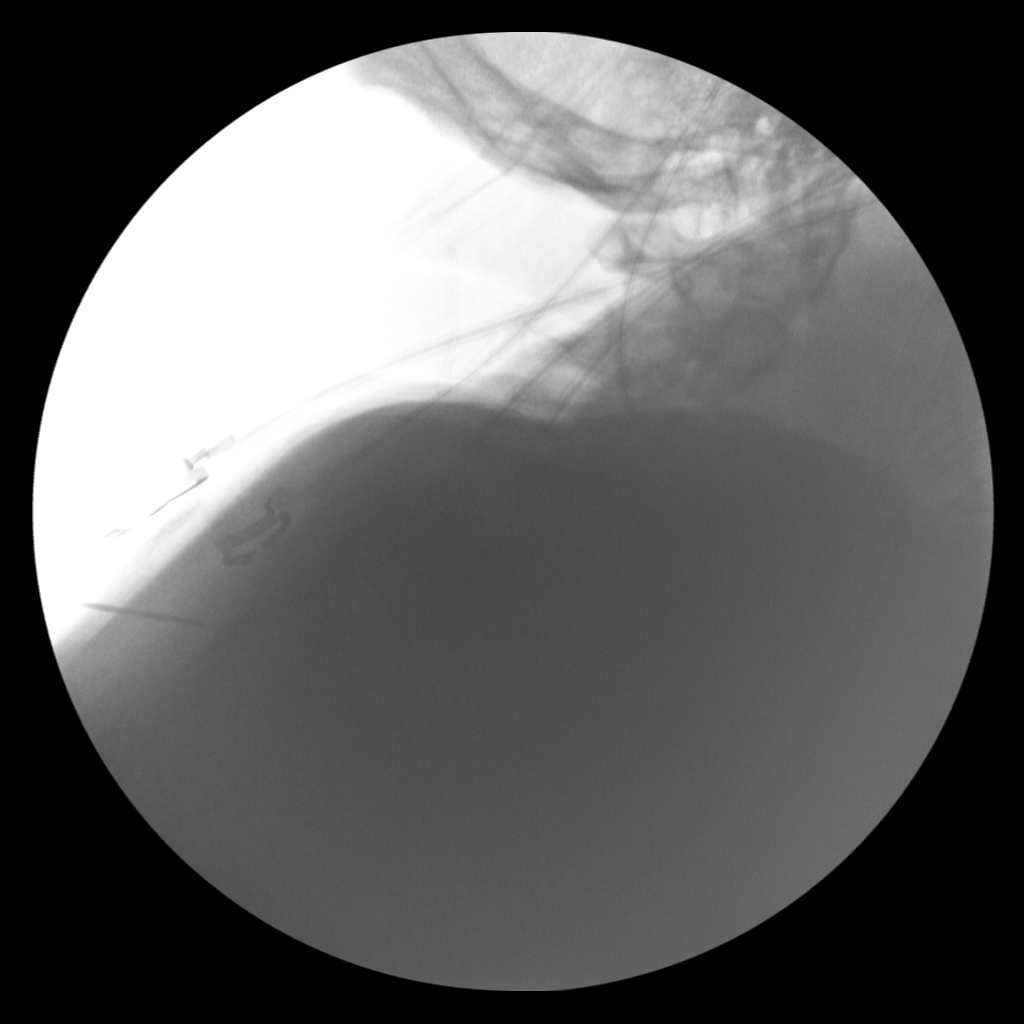
[im 2/5]
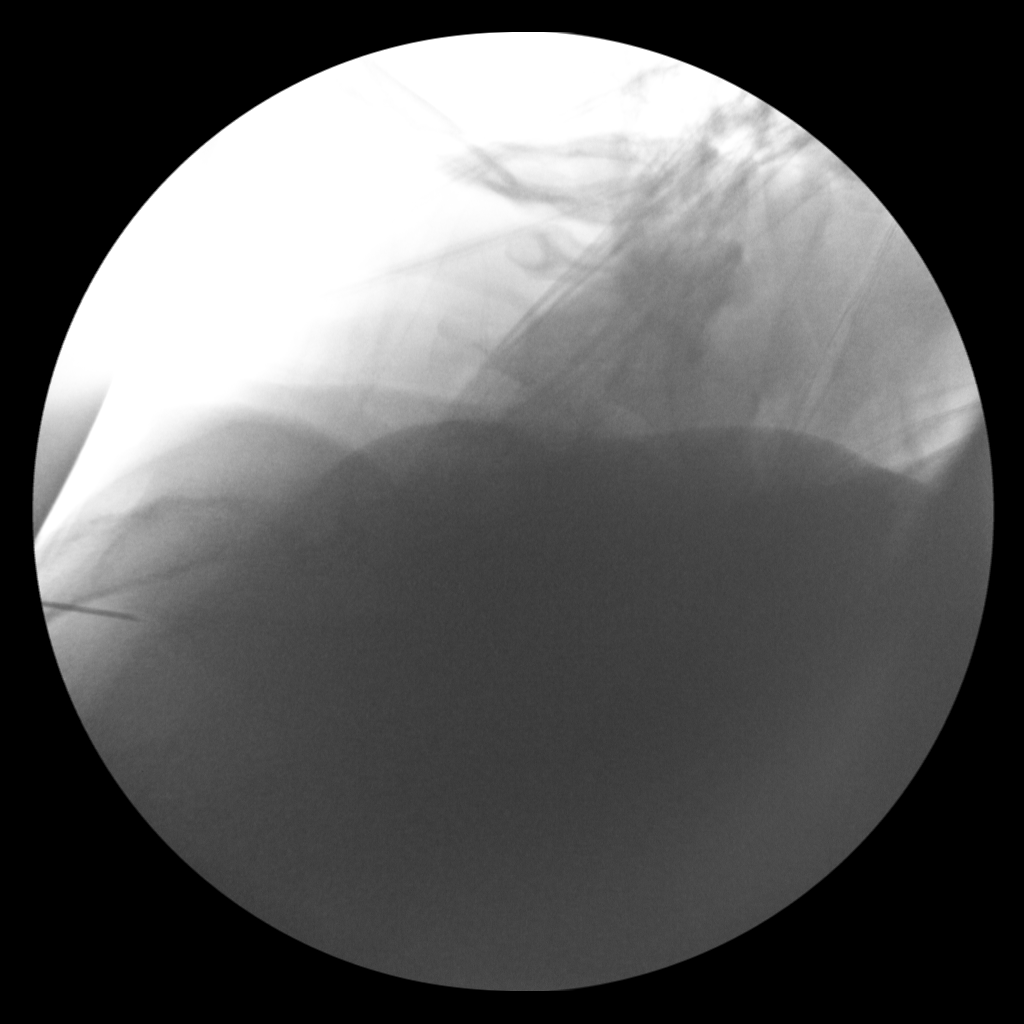
[im 3/5]
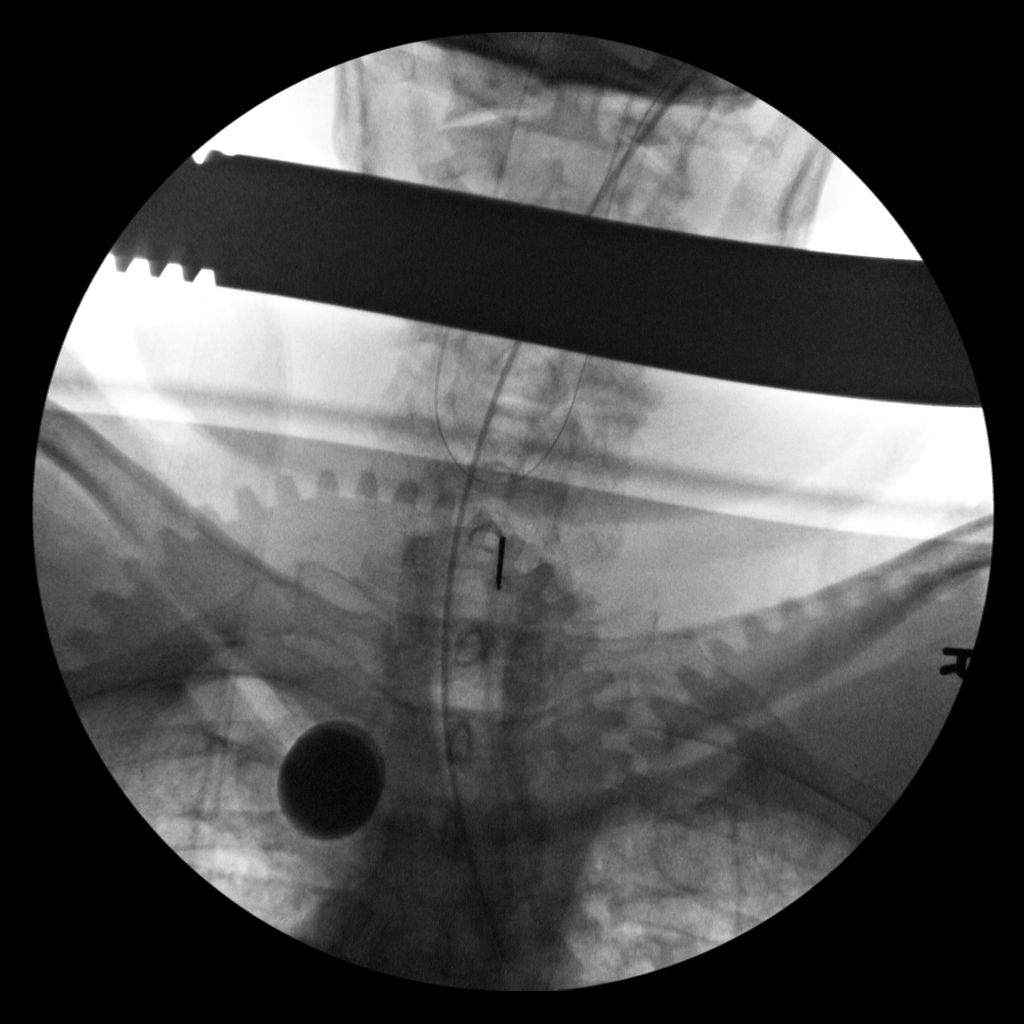
[im 4/5]
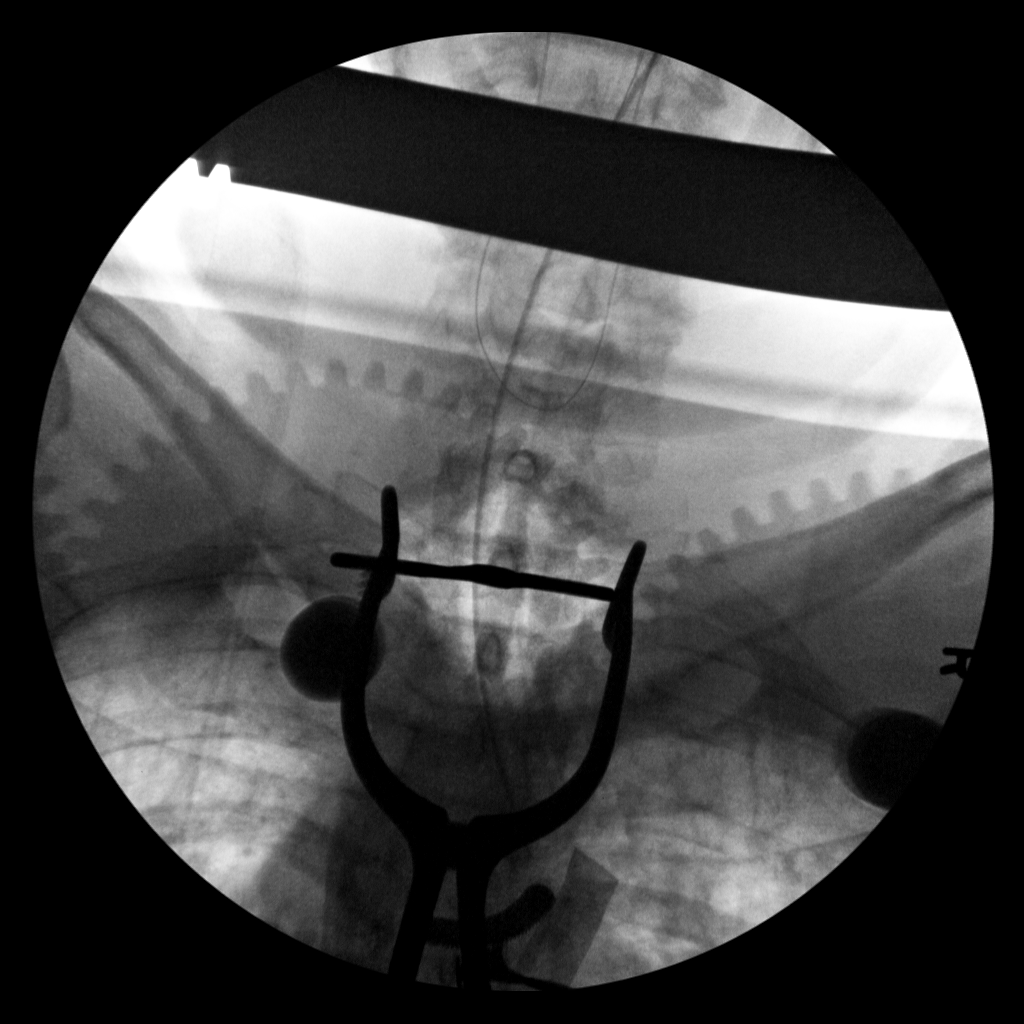
[im 5/5]
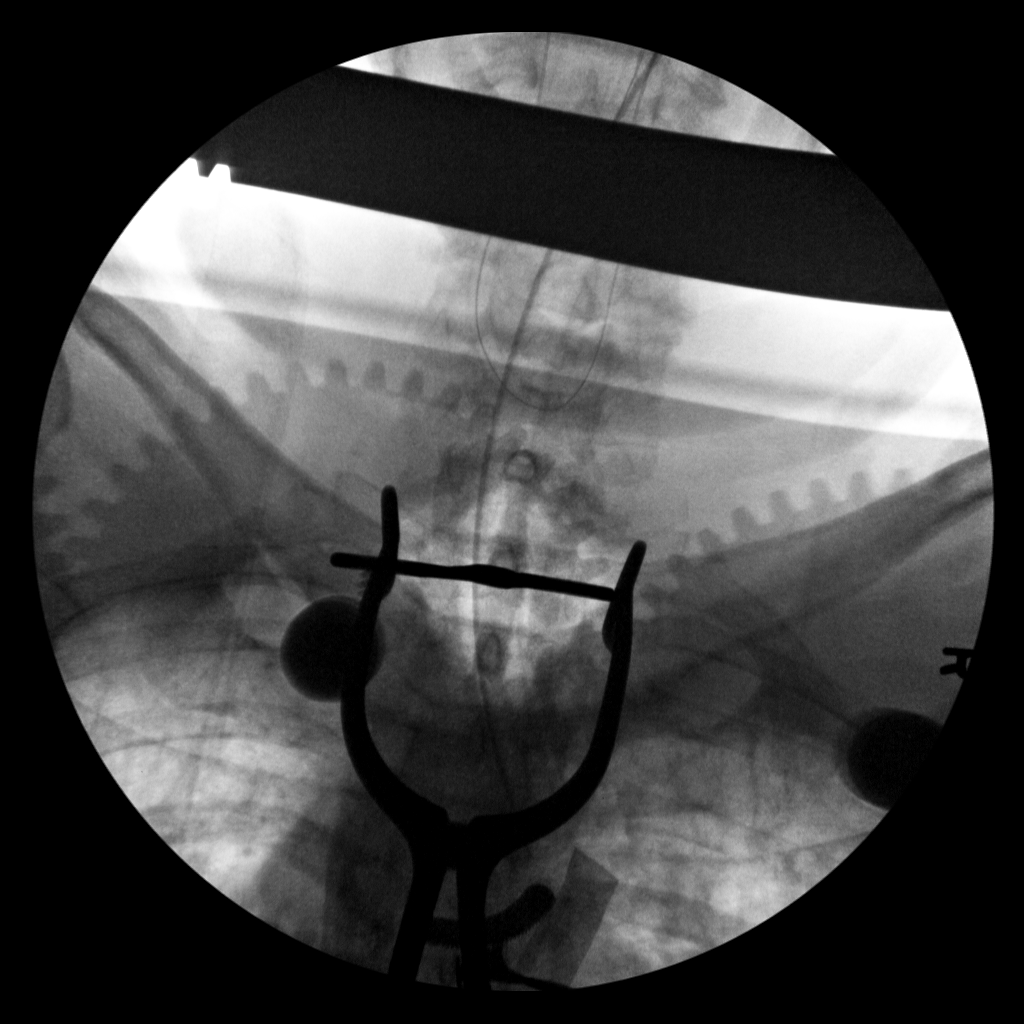

[5 of 5 positions shown; findings below may reference images not displayed]

FINDINGS: Multiple intraoperative fluoroscopic images demonstrate
instrumentation of the upper thoracic spine. An endotracheal tube is
noted. The lateral views of the thoracic spine are suboptimal
secondary to multiple overlapping structures and poor penetration.
IMPRESSION: Thoracic instrumentation as above.

## 2019-12-18 IMAGING — DX DG CHEST 1V PORT
1 series · 1 of 1 positions shown · non-contrast
Comparison: [DATE]

CLINICAL DATA: Tachycardia

EXAM:
PORTABLE CHEST 1 VIEW

[chest ap]
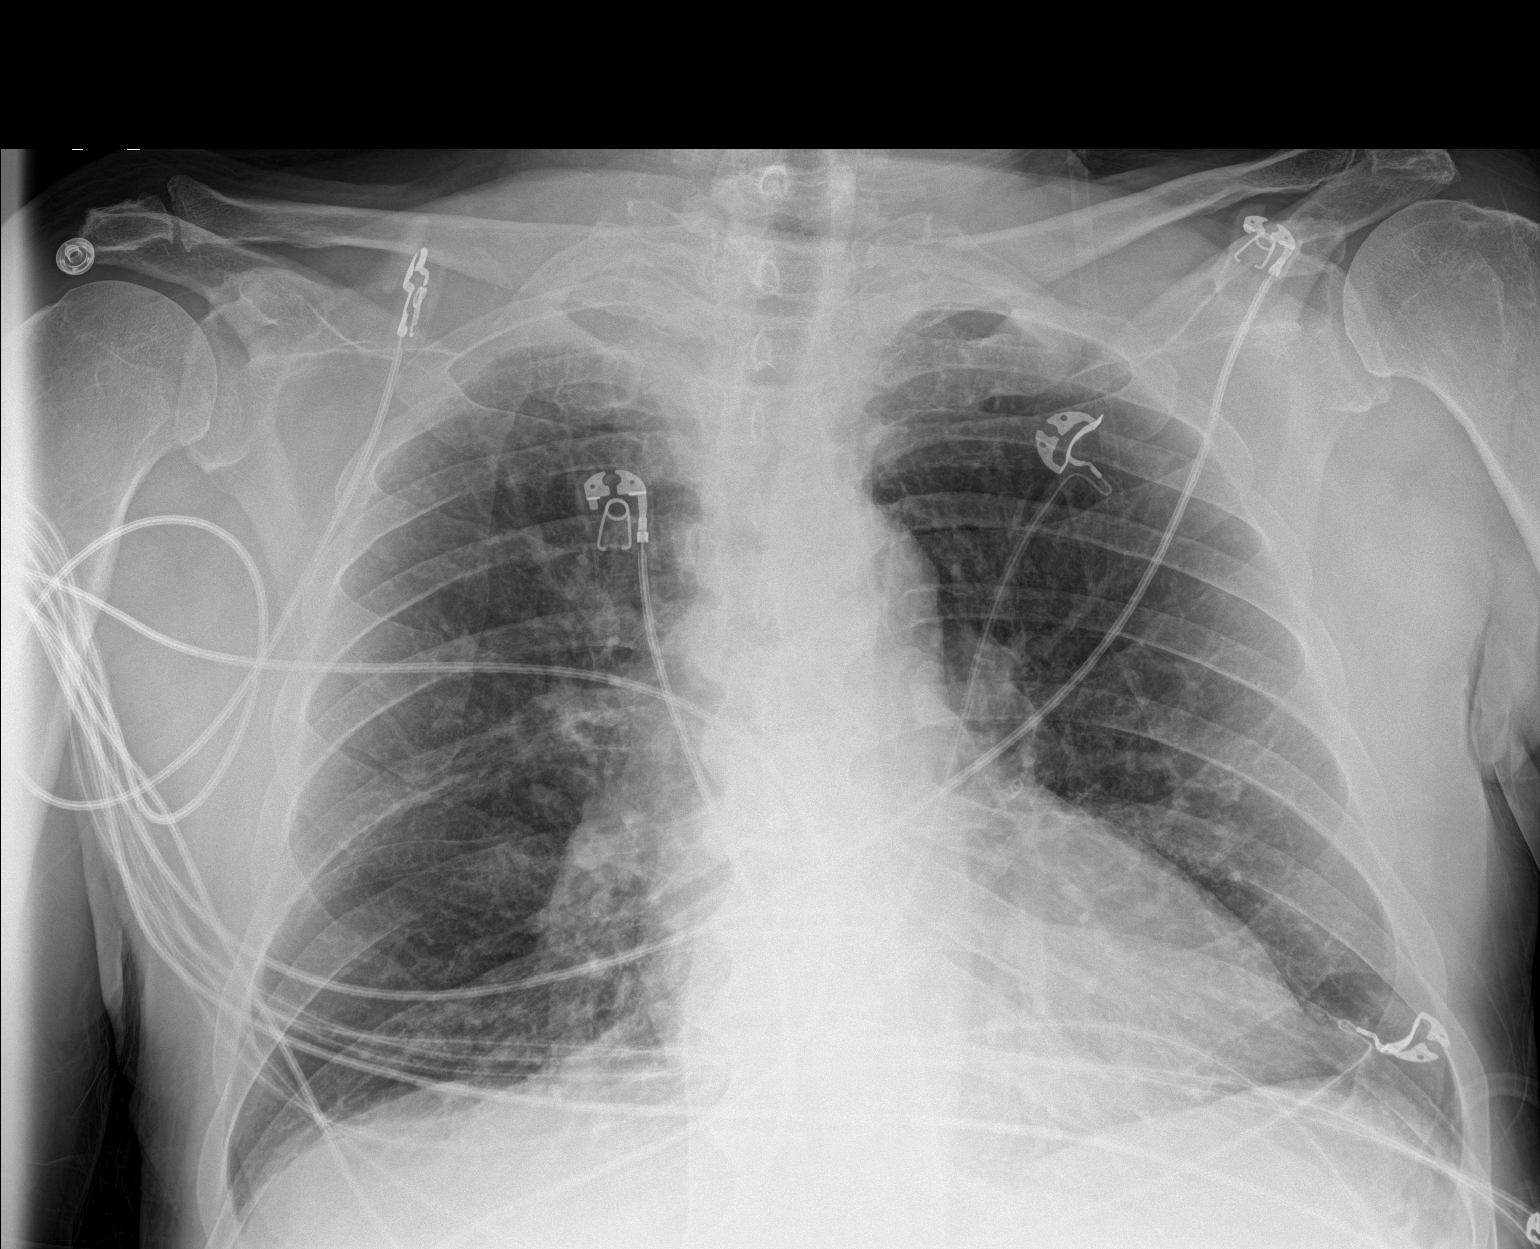

[1 of 1 positions shown; findings below may reference images not displayed]

FINDINGS: No focal airspace disease or effusion. Stable cardiomediastinal
silhouette. No pneumothorax.
IMPRESSION: No active disease.

## 2019-12-18 IMAGING — RF DG THORACIC SPINE 1V
1 series · 5 of 5 positions shown · non-contrast
Comparison: [DATE]

CLINICAL DATA: Thoracic laminectomy for abscess.

EXAM:
OPERATIVE THORACIC SPINE 5 VIEW(S)

[Series 1: run · 5 of 5 slices shown]
[im 1/5]
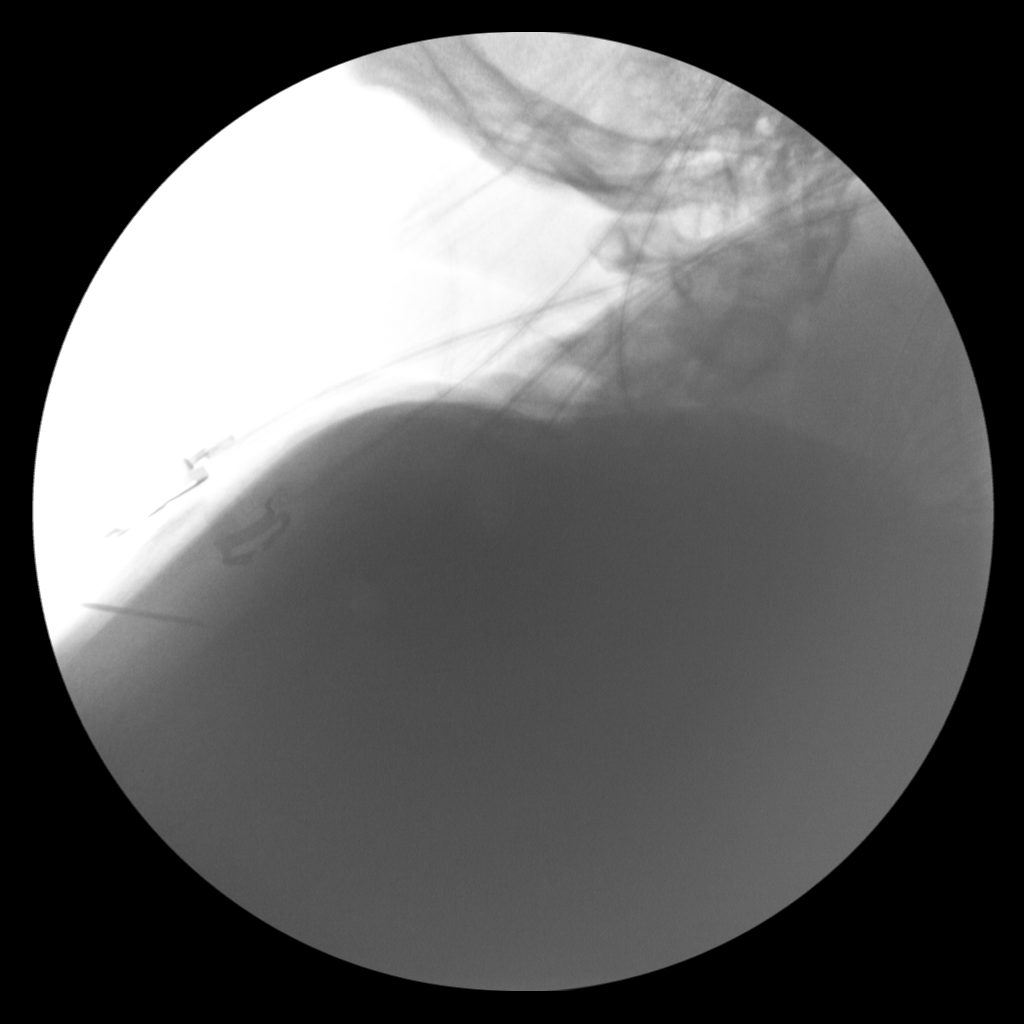
[im 2/5]
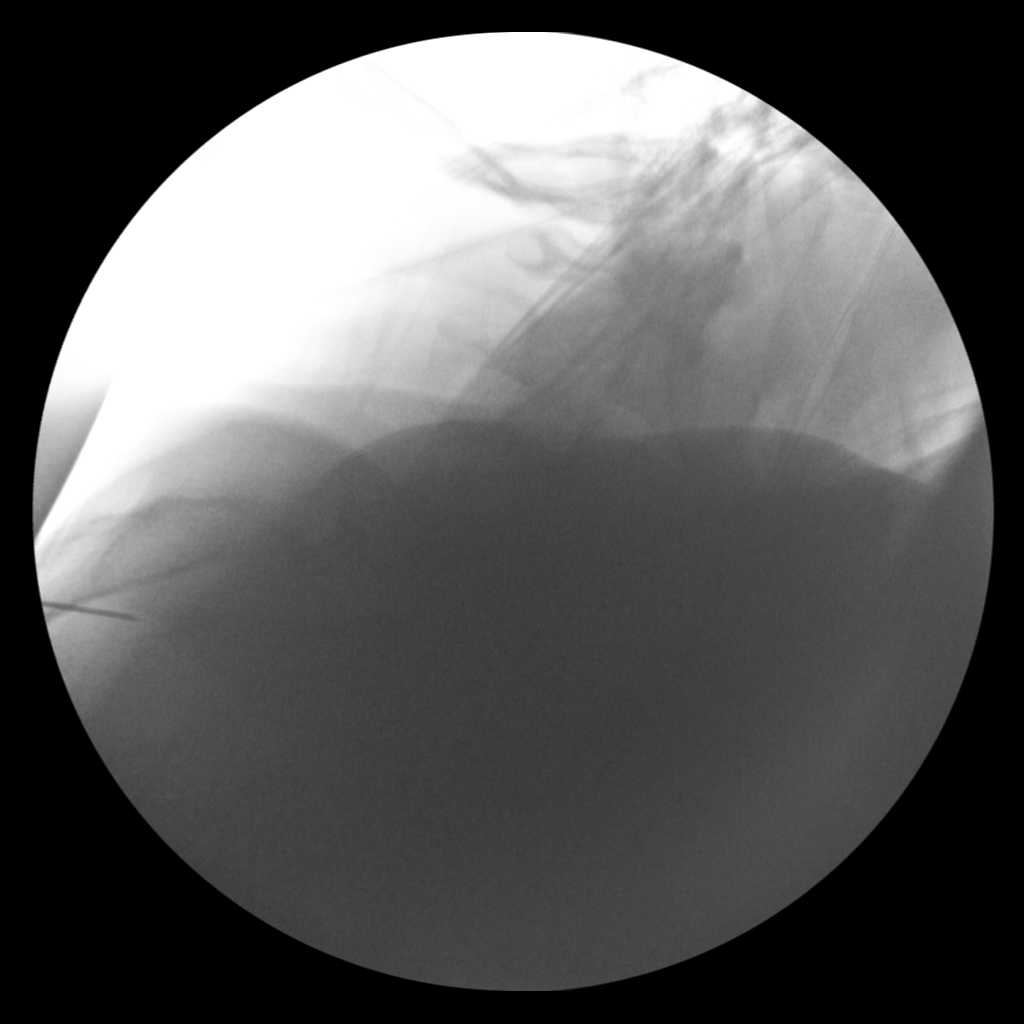
[im 3/5]
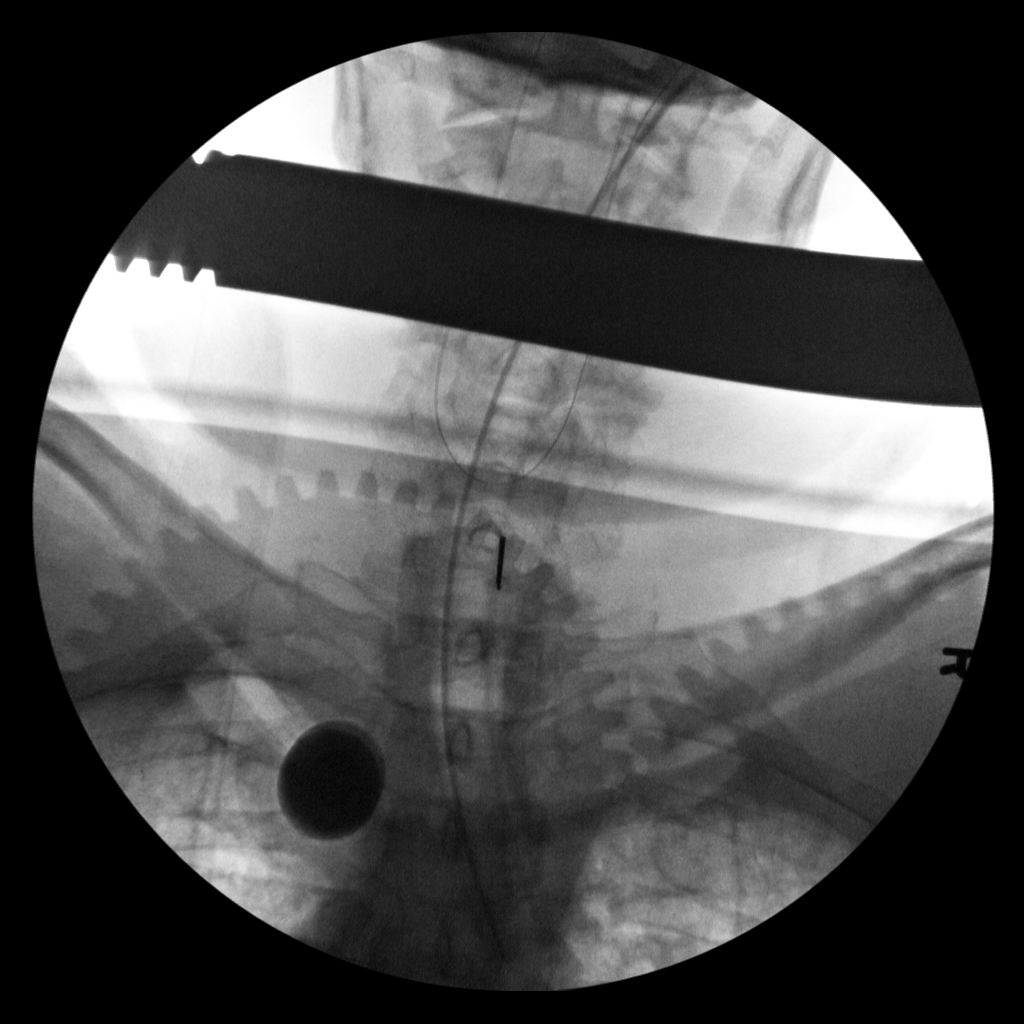
[im 4/5]
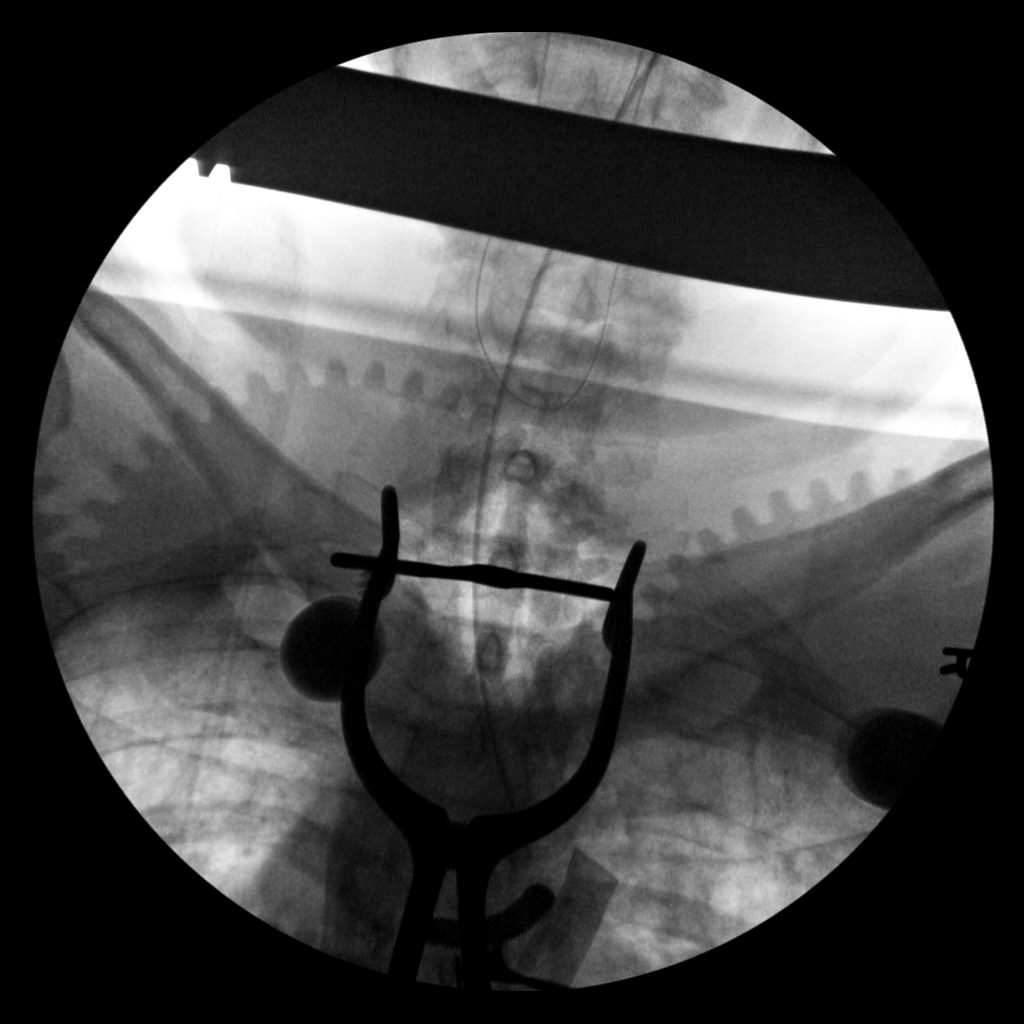
[im 5/5]
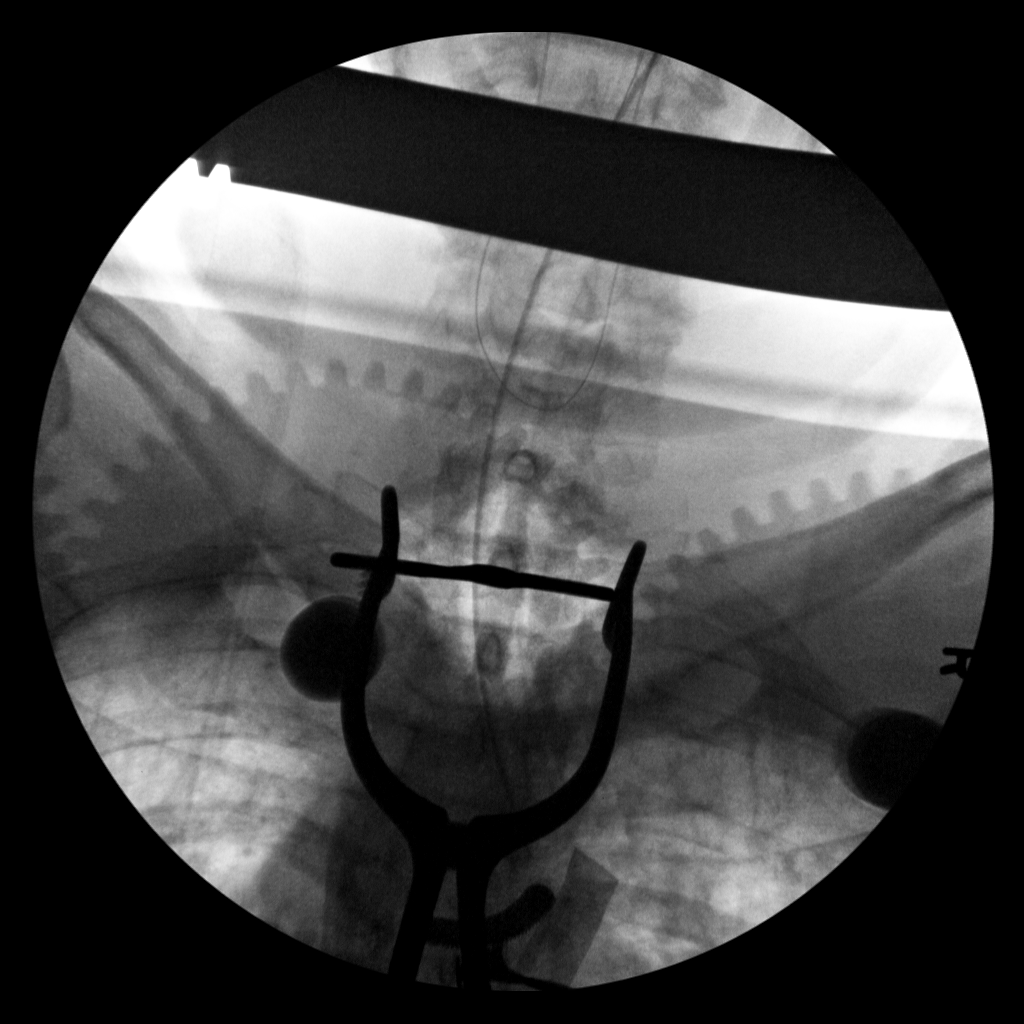

[5 of 5 positions shown; findings below may reference images not displayed]

FINDINGS: Multiple intraoperative fluoroscopic images demonstrate
instrumentation of the upper thoracic spine. An endotracheal tube is
noted. The lateral views of the thoracic spine are suboptimal
secondary to multiple overlapping structures and poor penetration.
IMPRESSION: Thoracic instrumentation as above.
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
## 2019-12-18 IMAGING — DX DG CHEST 1V PORT
1 series · 1 of 1 positions shown · non-contrast
Comparison: [DATE]

CLINICAL DATA: Endotracheal tube placement

EXAM:
PORTABLE CHEST 1 VIEW

[chest ap]
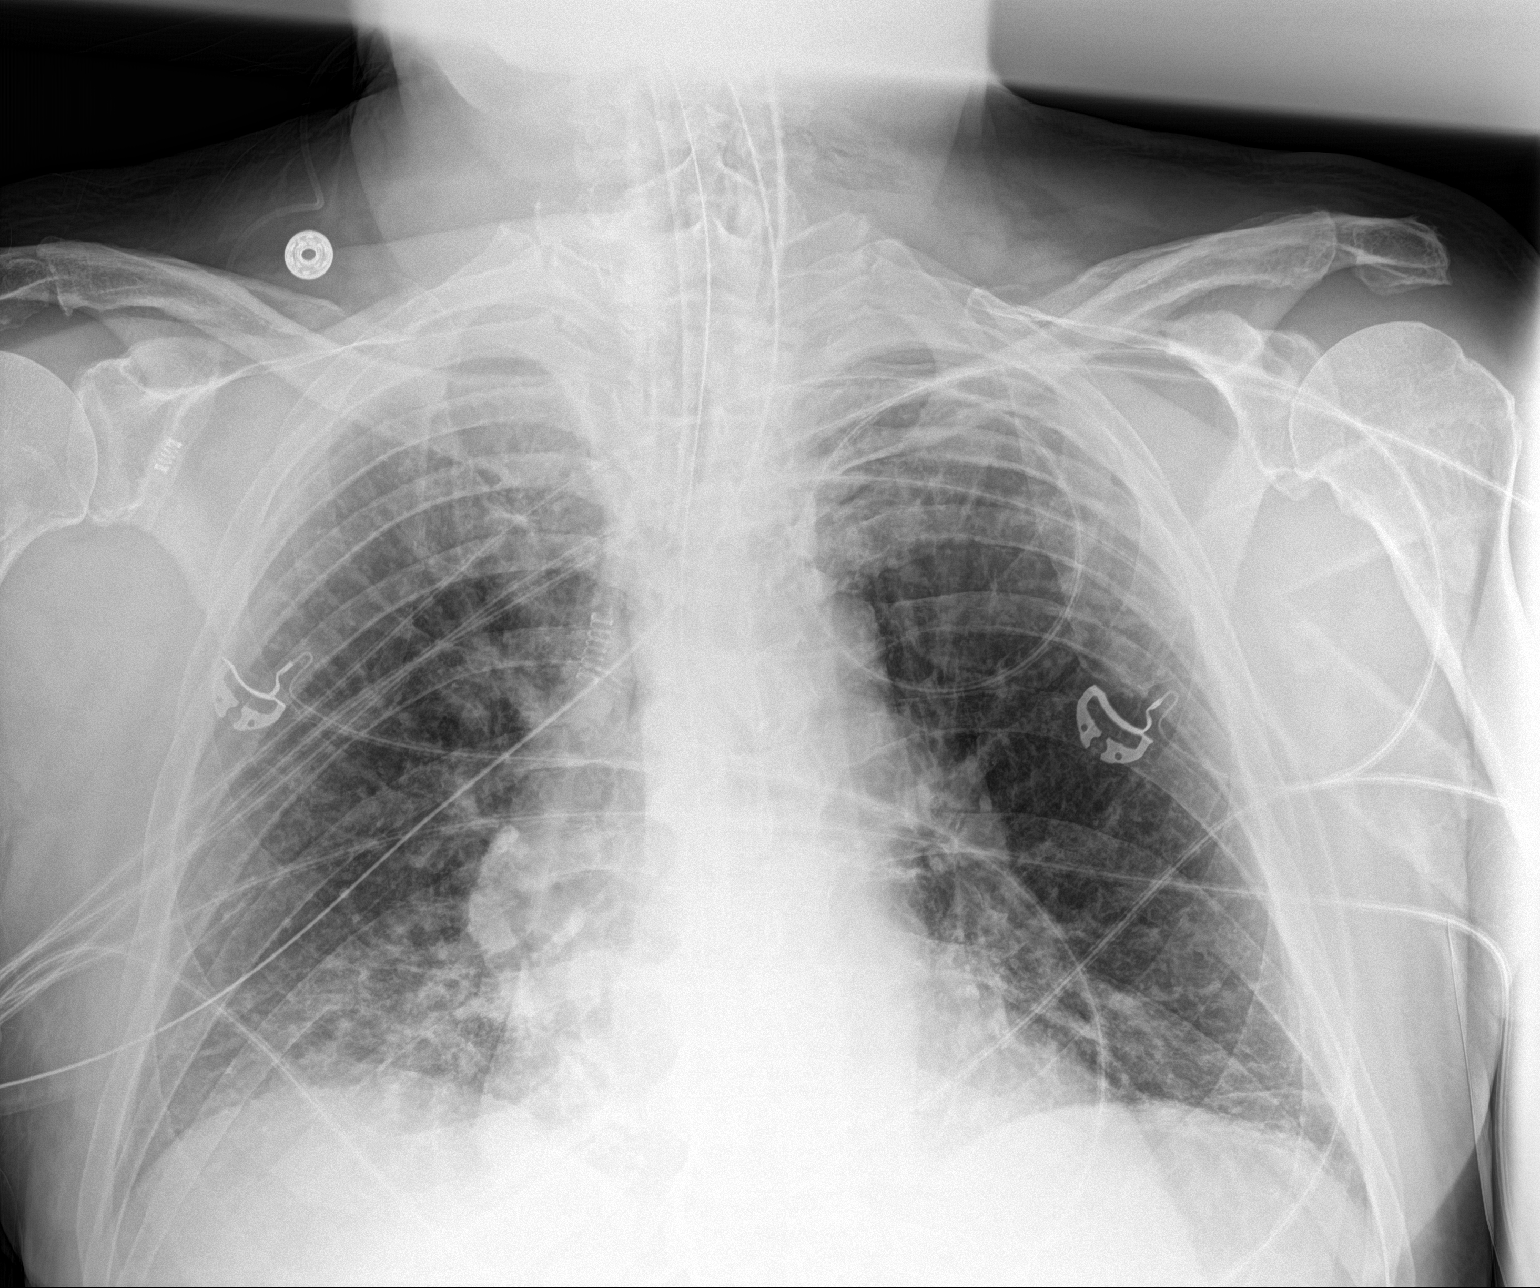

[1 of 1 positions shown; findings below may reference images not displayed]

FINDINGS: The heart size is borderline enlarged. The endotracheal tube
terminates approximately 4.2 cm above the carina. There are new
bibasilar airspace opacities favored to represent postoperative
atelectasis with pneumonia not entirely excluded. There is no
pneumothorax. Subcutaneous gas is noted along the patient's upper
thoracic cavity, likely related to instrumentation as seen on prior
studies. There is a linear object projecting over the patient's low
left neck. This may represent a central venous catheter.
IMPRESSION: 1. Lines and tubes as above.
2. New bibasilar airspace opacities which may represent
postoperative atelectasis with pneumonia not entirely excluded.

## 2019-12-18 IMAGING — MR MR THORACIC SPINE WO/W CM
6 of 12 series · 19 of 48 positions shown · IV contrast (gadavist)
Comparison: None.

CLINICAL DATA: Possible spinal infection, recent trauma with back
pain

EXAM:
MRI THORACIC WITHOUT AND WITH CONTRAST
TECHNIQUE: Multiplanar and multiecho pulse sequences of the thoracic spine were
obtained without and with intravenous contrast.
CONTRAST:  8mL GADAVIST GADOBUTROL 1 MMOL/ML IV SOLN

[Series 20: T1 · sagittal · 3.0mm · 0.62mm/px · 3 of 9 slices shown (1 of 4)]
[im 1/9]
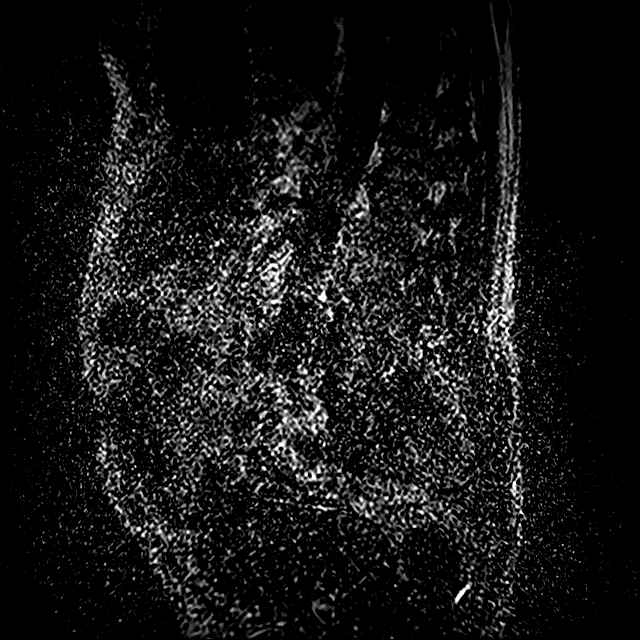
[im 5/9]
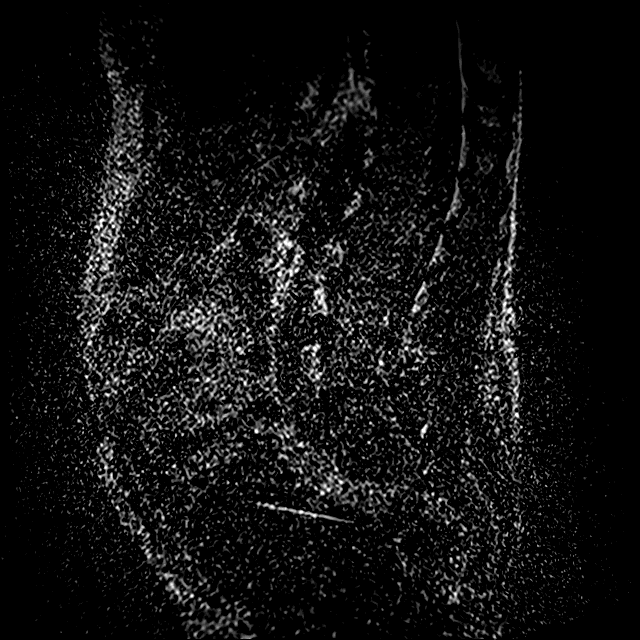
[im 9/9]
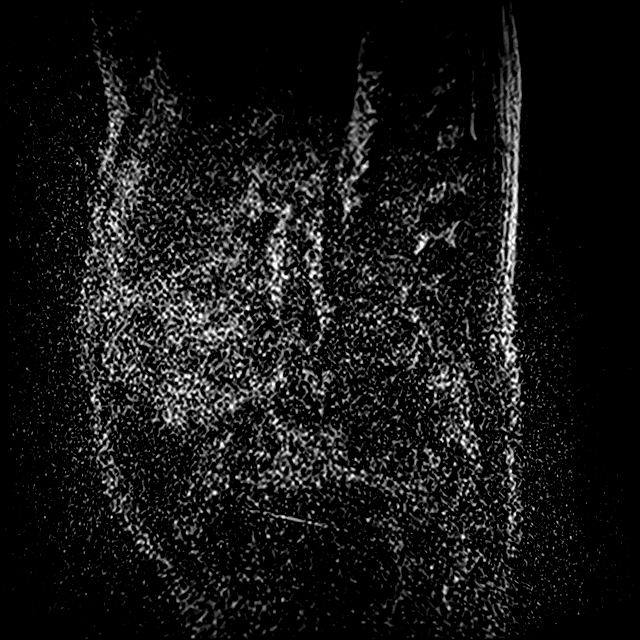

[Series 21: T1 · sagittal · 3.0mm · 0.62mm/px · 2 of 9 slices shown (2 of 4)]
[im 1/9]
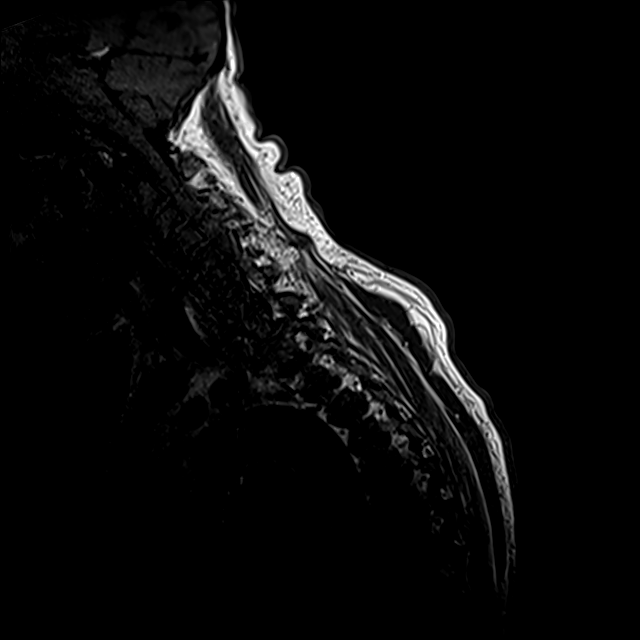
[im 9/9]
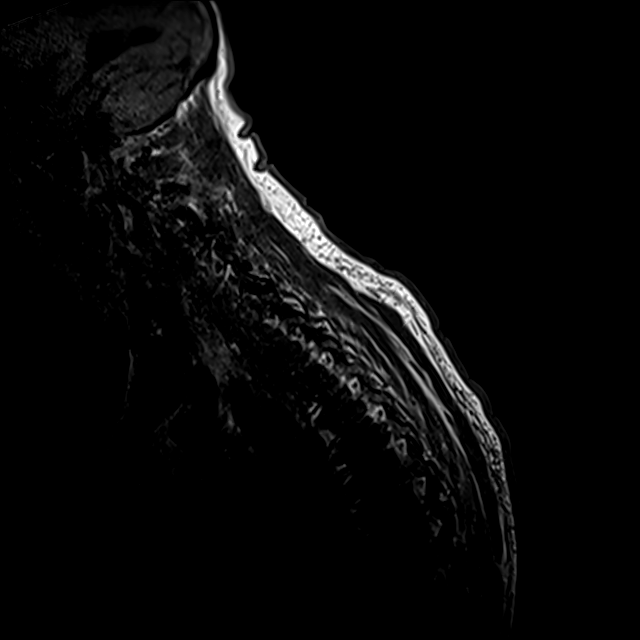

[Series 22: T1 · sagittal · 3.3mm · 0.62mm/px · 2 of 9 slices shown (3 of 4)]
[im 1/9]
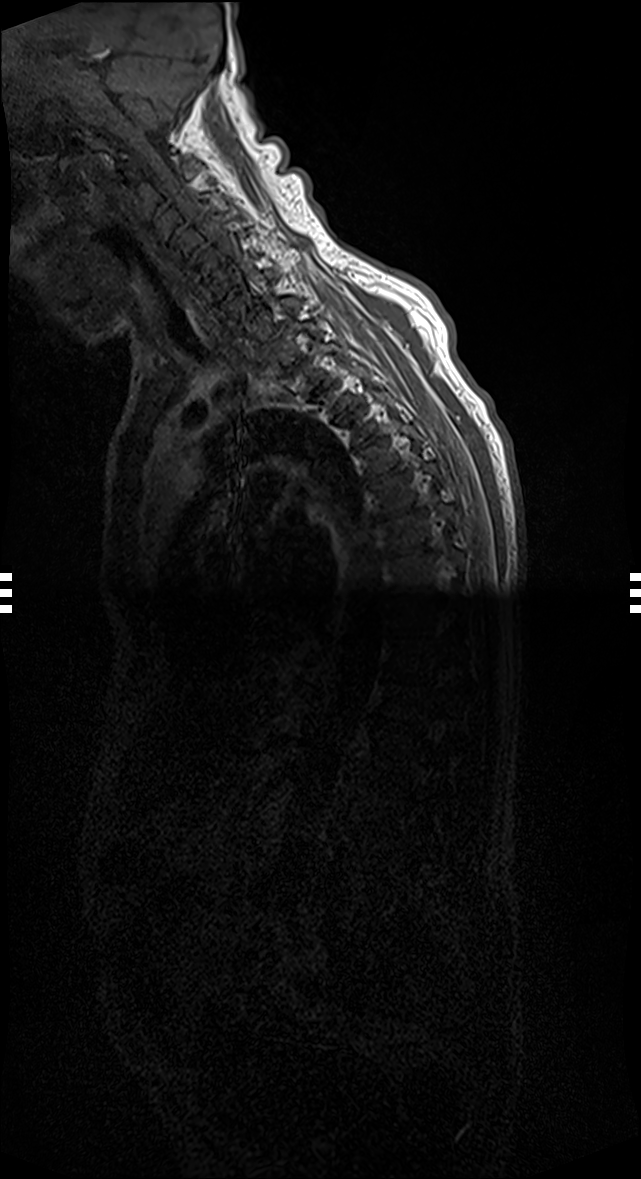
[im 9/9]
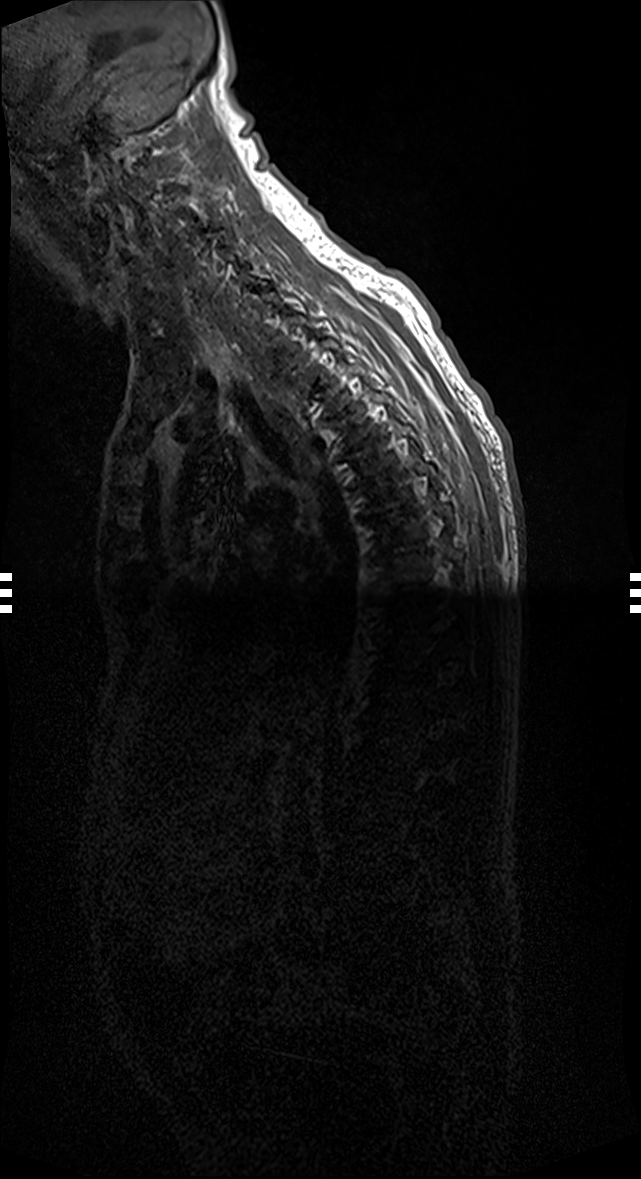

[Series 23: T2 · sagittal · 3.0mm · 0.78mm/px · 3 of 17 slices shown (1 of 2)]
[im 1/17]
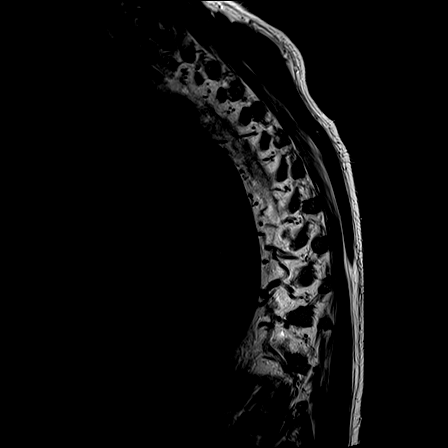
[im 9/17]
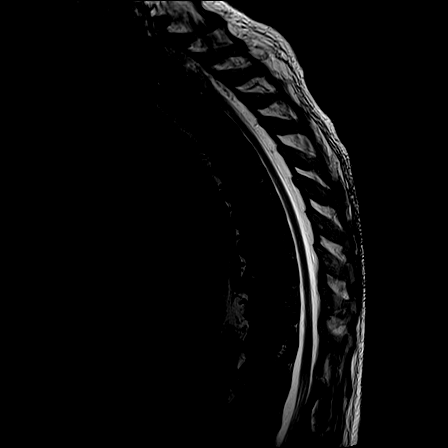
[im 17/17]
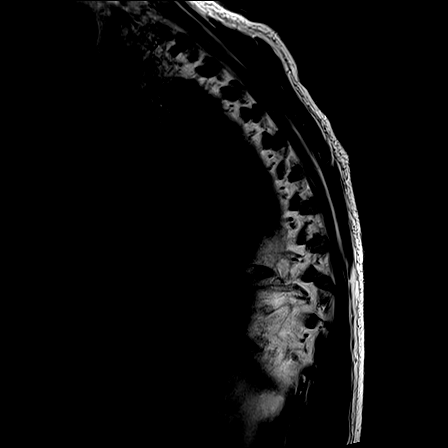

[Series 24: T1 · sagittal · 3.0mm · 0.80mm/px · 2 of 17 slices shown (4 of 4)]
[im 1/17]
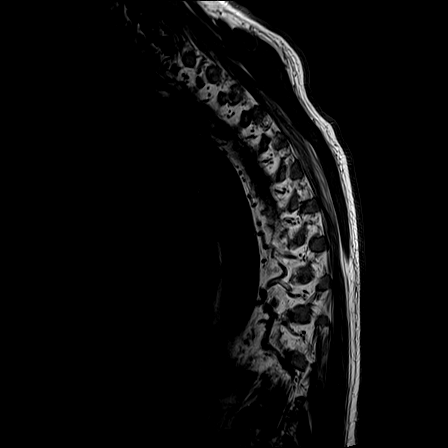
[im 9/17]
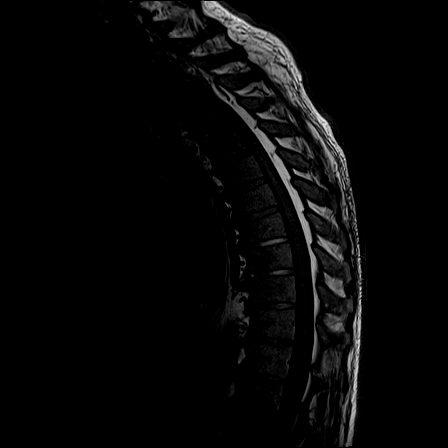

[Series 26: T2 · axial · 5.0mm · 0.59mm/px · z∈[-222,+95]mm · 7 of 39 slices shown (2 of 2)]
[im 1/39]
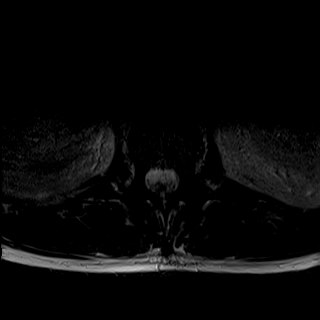
[im 7/39]
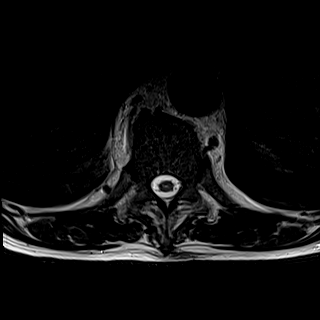
[im 13/39]
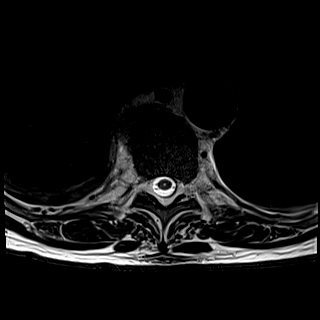
[im 20/39]
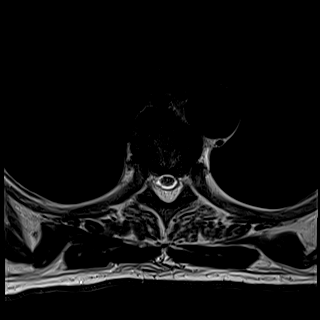
[im 26/39]
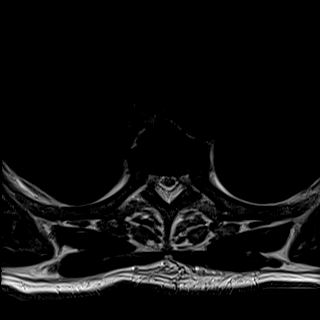
[im 32/39]
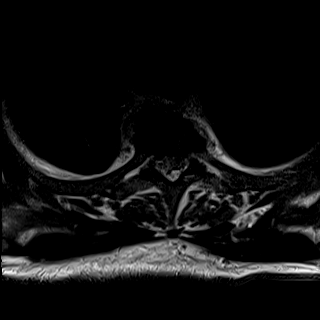
[im 39/39]
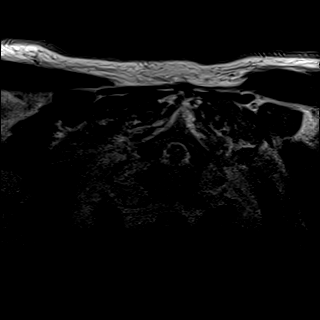

[19 of 48 positions shown; findings below may reference images not displayed]

FINDINGS: MRI THORACIC SPINE FINDINGS

Motion artifact is present.

Alignment: Thoracic kyphosis is preserved. Anteroposterior alignment
is maintained.

Vertebrae: Vertebral body heights are preserved. There is no marrow
edema or suspicious osseous lesion.

Cord: Normal caliber. There is likely incidental mild prominence of
the central canal of the spinal cord at midthoracic levels. There is
suspected dorsal epidural collection from T1 to T3-T4. This is most
visible at the T3-T4 level (series 28, image 11) with resulting
moderate canal stenosis. There may be right lateral epidural
involvement at the T2 and T3 levels as the thecal sac appears
slightly displaced toward the left.

Paraspinal and other soft tissues: Mild paravertebral enhancement at
upper thoracic levels.

Disc levels: Prominent dorsal epidural fat is present. There is no
disc herniation or significant degenerative canal or foraminal
stenosis.
IMPRESSION: Dorsal and possibly right lateral epidural collection from T1 to
T3-T4 causing moderate canal stenosis. Given history, this may
reflect hematoma or infection.

## 2019-12-18 SURGERY — THORACIC LAMINECTOMY FOR EPIDURAL ABSCESS
Anesthesia: General | Site: Spine Thoracic

## 2019-12-18 MED ORDER — OXYCODONE HCL 5 MG PO TABS: 5.0000 mg | ORAL_TABLET | Freq: Four times a day (QID) | ORAL | Status: DC | PRN

## 2019-12-18 MED ORDER — FENTANYL CITRATE (PF) 250 MCG/5ML IJ SOLN
INTRAMUSCULAR | Status: DC | PRN
  Administered 2019-12-18 (×2): 50 ug via INTRAVENOUS
  Administered 2019-12-18: 100 ug via INTRAVENOUS
  Administered 2019-12-18: 50 ug via INTRAVENOUS
  Administered 2019-12-18: 100 ug via INTRAVENOUS

## 2019-12-18 MED ORDER — BUPIVACAINE HCL (PF) 0.25 % IJ SOLN
INTRAMUSCULAR | Status: AC
  Filled 2019-12-18: qty 30

## 2019-12-18 MED ORDER — ONDANSETRON HCL 4 MG/2ML IJ SOLN
INTRAMUSCULAR | Status: AC
  Filled 2019-12-18: qty 2

## 2019-12-18 MED ORDER — SODIUM CHLORIDE 0.9 % IV SOLN
2.0000 g | Freq: Once | INTRAVENOUS | Status: AC
  Administered 2019-12-18: 2 g via INTRAVENOUS
  Filled 2019-12-18: qty 2

## 2019-12-18 MED ORDER — GABAPENTIN 100 MG PO CAPS: 200.0000 mg | ORAL_CAPSULE | ORAL | Status: DC

## 2019-12-18 MED ORDER — SODIUM CHLORIDE 0.9 % IV SOLN
2.0000 g | Freq: Three times a day (TID) | INTRAVENOUS | Status: DC
  Administered 2019-12-18: 2 g via INTRAVENOUS
  Filled 2019-12-18 (×5): qty 2

## 2019-12-18 MED ORDER — ESMOLOL HCL 100 MG/10ML IV SOLN
INTRAVENOUS | Status: DC | PRN
  Administered 2019-12-18 (×2): 10 ug via INTRAVENOUS

## 2019-12-18 MED ORDER — GABAPENTIN 600 MG PO TABS
300.0000 mg | ORAL_TABLET | Freq: Every day | ORAL | Status: DC
  Filled 2019-12-18: qty 0.5

## 2019-12-18 MED ORDER — CHLORHEXIDINE GLUCONATE 0.12% ORAL RINSE (MEDLINE KIT)
15.0000 mL | Freq: Two times a day (BID) | OROMUCOSAL | Status: DC
  Administered 2019-12-18 – 2019-12-19 (×2): 15 mL via OROMUCOSAL

## 2019-12-18 MED ORDER — PANTOPRAZOLE SODIUM 40 MG IV SOLR
40.0000 mg | Freq: Every day | INTRAVENOUS | Status: DC
  Administered 2019-12-18 – 2019-12-22 (×5): 40 mg via INTRAVENOUS
  Filled 2019-12-18 (×5): qty 40

## 2019-12-18 MED ORDER — SUCCINYLCHOLINE CHLORIDE 200 MG/10ML IV SOSY
PREFILLED_SYRINGE | INTRAVENOUS | Status: AC
  Filled 2019-12-18: qty 10

## 2019-12-18 MED ORDER — OXYCODONE HCL 5 MG PO TABS
10.0000 mg | ORAL_TABLET | Freq: Four times a day (QID) | ORAL | Status: DC
  Filled 2019-12-18: qty 2

## 2019-12-18 MED ORDER — BISACODYL 10 MG RE SUPP: 10.0000 mg | Freq: Every day | RECTAL | Status: DC | PRN

## 2019-12-18 MED ORDER — THROMBIN 5000 UNITS EX SOLR
OROMUCOSAL | Status: DC | PRN
  Administered 2019-12-18: 5 mL

## 2019-12-18 MED ORDER — HYDROMORPHONE HCL 1 MG/ML IJ SOLN
0.5000 mg | Freq: Four times a day (QID) | INTRAMUSCULAR | Status: DC | PRN
  Administered 2019-12-18: 0.5 mg via INTRAVENOUS
  Filled 2019-12-18: qty 1

## 2019-12-18 MED ORDER — ORAL CARE MOUTH RINSE: 15.0000 mL | OROMUCOSAL | Status: DC

## 2019-12-18 MED ORDER — METRONIDAZOLE IN NACL 5-0.79 MG/ML-% IV SOLN
500.0000 mg | Freq: Once | INTRAVENOUS | Status: AC
  Administered 2019-12-18: 500 mg via INTRAVENOUS
  Filled 2019-12-18: qty 100

## 2019-12-18 MED ORDER — ACETAMINOPHEN 325 MG PO TABS
650.0000 mg | ORAL_TABLET | Freq: Once | ORAL | Status: AC
  Administered 2019-12-18: 650 mg via ORAL
  Filled 2019-12-18: qty 2

## 2019-12-18 MED ORDER — LORAZEPAM 2 MG/ML IJ SOLN
1.0000 mg | INTRAMUSCULAR | Status: DC | PRN
  Administered 2019-12-18: 1 mg via INTRAVENOUS

## 2019-12-18 MED ORDER — VANCOMYCIN HCL 750 MG/150ML IV SOLN
750.0000 mg | Freq: Two times a day (BID) | INTRAVENOUS | Status: DC
  Administered 2019-12-18 – 2019-12-19 (×2): 750 mg via INTRAVENOUS
  Filled 2019-12-18 (×2): qty 150

## 2019-12-18 MED ORDER — DEXMEDETOMIDINE HCL IN NACL 200 MCG/50ML IV SOLN
INTRAVENOUS | Status: DC | PRN
  Administered 2019-12-18: .7 ug via INTRAVENOUS

## 2019-12-18 MED ORDER — ONDANSETRON HCL 4 MG/2ML IJ SOLN
INTRAMUSCULAR | Status: DC | PRN
  Administered 2019-12-18: 4 mg via INTRAVENOUS

## 2019-12-18 MED ORDER — TESTOSTERONE CYPIONATE 200 MG/ML IM SOLN: 180.0000 mg | INTRAMUSCULAR | Status: DC

## 2019-12-18 MED ORDER — ALBUTEROL SULFATE HFA 108 (90 BASE) MCG/ACT IN AERS
2.0000 | INHALATION_SPRAY | Freq: Four times a day (QID) | RESPIRATORY_TRACT | Status: DC | PRN
  Administered 2019-12-18: 2 via RESPIRATORY_TRACT
  Filled 2019-12-18: qty 6.7

## 2019-12-18 MED ORDER — OXYCODONE HCL 5 MG PO TABS
10.0000 mg | ORAL_TABLET | Freq: Four times a day (QID) | ORAL | Status: DC
  Administered 2019-12-18 – 2019-12-19 (×3): 10 mg
  Filled 2019-12-18 (×2): qty 2

## 2019-12-18 MED ORDER — ESMOLOL HCL 100 MG/10ML IV SOLN
INTRAVENOUS | Status: AC
  Filled 2019-12-18: qty 10

## 2019-12-18 MED ORDER — THROMBIN 5000 UNITS EX SOLR
CUTANEOUS | Status: AC
  Filled 2019-12-18: qty 5000

## 2019-12-18 MED ORDER — 0.9 % SODIUM CHLORIDE (POUR BTL) OPTIME
TOPICAL | Status: DC | PRN
  Administered 2019-12-18: 1000 mL

## 2019-12-18 MED ORDER — LIDOCAINE-EPINEPHRINE 1 %-1:100000 IJ SOLN
INTRAMUSCULAR | Status: AC
  Filled 2019-12-18: qty 1

## 2019-12-18 MED ORDER — ALBUMIN HUMAN 5 % IV SOLN: INTRAVENOUS | Status: DC | PRN

## 2019-12-18 MED ORDER — SODIUM CHLORIDE 0.9 % IV BOLUS
1000.0000 mL | Freq: Once | INTRAVENOUS | Status: AC
  Administered 2019-12-18: 1000 mL via INTRAVENOUS

## 2019-12-18 MED ORDER — DICLOFENAC SODIUM 50 MG PO TBEC
50.0000 mg | DELAYED_RELEASE_TABLET | Freq: Every day | ORAL | Status: DC
  Filled 2019-12-18: qty 1

## 2019-12-18 MED ORDER — BUPIVACAINE HCL (PF) 0.25 % IJ SOLN
INTRAMUSCULAR | Status: DC | PRN
  Administered 2019-12-18: 5 mL

## 2019-12-18 MED ORDER — DEXAMETHASONE SODIUM PHOSPHATE 10 MG/ML IJ SOLN
INTRAMUSCULAR | Status: DC | PRN
  Administered 2019-12-18: 5 mg via INTRAVENOUS

## 2019-12-18 MED ORDER — HYDROMORPHONE HCL 1 MG/ML IJ SOLN
1.0000 mg | Freq: Once | INTRAMUSCULAR | Status: AC
  Administered 2019-12-18: 1 mg via INTRAVENOUS
  Filled 2019-12-18: qty 1

## 2019-12-18 MED ORDER — FENTANYL CITRATE (PF) 100 MCG/2ML IJ SOLN
25.0000 ug | INTRAMUSCULAR | Status: DC | PRN
  Administered 2019-12-19: 10:00:00 25 ug via INTRAVENOUS
  Filled 2019-12-18: qty 2

## 2019-12-18 MED ORDER — SUCCINYLCHOLINE CHLORIDE 200 MG/10ML IV SOSY
PREFILLED_SYRINGE | INTRAVENOUS | Status: DC | PRN
  Administered 2019-12-18: 120 mg via INTRAVENOUS

## 2019-12-18 MED ORDER — LIDOCAINE 2% (20 MG/ML) 5 ML SYRINGE
INTRAMUSCULAR | Status: AC
  Filled 2019-12-18: qty 5

## 2019-12-18 MED ORDER — LIDOCAINE-EPINEPHRINE 1 %-1:100000 IJ SOLN
INTRAMUSCULAR | Status: DC | PRN
  Administered 2019-12-18: 5 mL

## 2019-12-18 MED ORDER — LACTATED RINGERS IV SOLN: INTRAVENOUS | Status: DC

## 2019-12-18 MED ORDER — ADULT MULTIVITAMIN W/MINERALS CH
1.0000 | ORAL_TABLET | Freq: Every day | ORAL | Status: DC
  Administered 2019-12-19 – 2019-12-25 (×7): 1 via ORAL
  Filled 2019-12-18 (×7): qty 1

## 2019-12-18 MED ORDER — DOCUSATE SODIUM 50 MG/5ML PO LIQD: 100.0000 mg | Freq: Two times a day (BID) | ORAL | Status: DC | PRN

## 2019-12-18 MED ORDER — PROPOFOL 10 MG/ML IV BOLUS
INTRAVENOUS | Status: DC | PRN
  Administered 2019-12-18: 150 mg via INTRAVENOUS

## 2019-12-18 MED ORDER — RAMELTEON 8 MG PO TABS
8.0000 mg | ORAL_TABLET | Freq: Every day | ORAL | Status: DC
  Filled 2019-12-18: qty 1

## 2019-12-18 MED ORDER — IOHEXOL 350 MG/ML SOLN
75.0000 mL | Freq: Once | INTRAVENOUS | Status: AC | PRN
  Administered 2019-12-18: 75 mL via INTRAVENOUS

## 2019-12-18 MED ORDER — ORAL CARE MOUTH RINSE
15.0000 mL | OROMUCOSAL | Status: DC
  Administered 2019-12-18 – 2019-12-19 (×7): 15 mL via OROMUCOSAL

## 2019-12-18 MED ORDER — VANCOMYCIN HCL IN DEXTROSE 1-5 GM/200ML-% IV SOLN
1000.0000 mg | Freq: Once | INTRAVENOUS | Status: AC
  Administered 2019-12-18: 1000 mg via INTRAVENOUS
  Filled 2019-12-18: qty 200

## 2019-12-18 MED ORDER — LORAZEPAM 2 MG/ML IJ SOLN: 0.5000 mg | Freq: Once | INTRAMUSCULAR | Status: DC

## 2019-12-18 MED ORDER — ACETAMINOPHEN 325 MG PO TABS
650.0000 mg | ORAL_TABLET | Freq: Four times a day (QID) | ORAL | Status: DC | PRN
  Administered 2019-12-18 – 2019-12-23 (×5): 650 mg via ORAL
  Filled 2019-12-18 (×6): qty 2

## 2019-12-18 MED ORDER — SODIUM CHLORIDE 0.9 % IV SOLN
2.0000 g | INTRAVENOUS | Status: DC
  Administered 2019-12-18: 2 g via INTRAVENOUS
  Filled 2019-12-18: qty 2
  Filled 2019-12-18: qty 20

## 2019-12-18 MED ORDER — DEXMEDETOMIDINE HCL IN NACL 400 MCG/100ML IV SOLN
0.0000 ug/kg/h | INTRAVENOUS | Status: DC
  Administered 2019-12-18 (×2): 1.7 ug/kg/h via INTRAVENOUS
  Administered 2019-12-19: 1.654 ug/kg/h via INTRAVENOUS
  Administered 2019-12-19: 1.7 ug/kg/h via INTRAVENOUS
  Filled 2019-12-18: qty 200
  Filled 2019-12-18: qty 100
  Filled 2019-12-18: qty 200

## 2019-12-18 MED ORDER — LORAZEPAM 1 MG PO TABS: 1.0000 mg | ORAL_TABLET | ORAL | Status: DC | PRN

## 2019-12-18 MED ORDER — ALBUTEROL SULFATE HFA 108 (90 BASE) MCG/ACT IN AERS
6.0000 | INHALATION_SPRAY | Freq: Once | RESPIRATORY_TRACT | Status: AC
  Administered 2019-12-18: 6 via RESPIRATORY_TRACT
  Filled 2019-12-18: qty 6.7

## 2019-12-18 MED ORDER — SODIUM CHLORIDE 0.9 % IV BOLUS (SEPSIS)
1000.0000 mL | Freq: Once | INTRAVENOUS | Status: AC
  Administered 2019-12-18: 1000 mL via INTRAVENOUS

## 2019-12-18 MED ORDER — CHLORHEXIDINE GLUCONATE CLOTH 2 % EX PADS
6.0000 | MEDICATED_PAD | Freq: Every day | CUTANEOUS | Status: DC
  Administered 2019-12-18 – 2019-12-20 (×2): 6 via TOPICAL

## 2019-12-18 MED ORDER — THIAMINE HCL 100 MG PO TABS
100.0000 mg | ORAL_TABLET | Freq: Every day | ORAL | Status: DC
  Administered 2019-12-22 – 2019-12-25 (×4): 100 mg via ORAL
  Filled 2019-12-18 (×4): qty 1

## 2019-12-18 MED ORDER — ROCURONIUM BROMIDE 10 MG/ML (PF) SYRINGE
PREFILLED_SYRINGE | INTRAVENOUS | Status: AC
  Filled 2019-12-18: qty 10

## 2019-12-18 MED ORDER — FENTANYL CITRATE (PF) 250 MCG/5ML IJ SOLN
INTRAMUSCULAR | Status: AC
  Filled 2019-12-18: qty 5

## 2019-12-18 MED ORDER — IPRATROPIUM BROMIDE HFA 17 MCG/ACT IN AERS
2.0000 | INHALATION_SPRAY | Freq: Once | RESPIRATORY_TRACT | Status: AC
  Administered 2019-12-18: 2 via RESPIRATORY_TRACT
  Filled 2019-12-18: qty 12.9

## 2019-12-18 MED ORDER — IPRATROPIUM-ALBUTEROL 0.5-2.5 (3) MG/3ML IN SOLN
3.0000 mL | Freq: Three times a day (TID) | RESPIRATORY_TRACT | Status: DC
  Administered 2019-12-18 – 2019-12-25 (×19): 3 mL via RESPIRATORY_TRACT
  Filled 2019-12-18 (×20): qty 3

## 2019-12-18 MED ORDER — PHENYLEPHRINE HCL-NACL 10-0.9 MG/250ML-% IV SOLN
INTRAVENOUS | Status: DC | PRN
  Administered 2019-12-18: 25 ug/min via INTRAVENOUS

## 2019-12-18 MED ORDER — DEXAMETHASONE SODIUM PHOSPHATE 10 MG/ML IJ SOLN
INTRAMUSCULAR | Status: AC
  Filled 2019-12-18: qty 1

## 2019-12-18 MED ORDER — PROPOFOL 10 MG/ML IV BOLUS
INTRAVENOUS | Status: AC
  Filled 2019-12-18: qty 20

## 2019-12-18 MED ORDER — THROMBIN 20000 UNITS EX SOLR
CUTANEOUS | Status: AC
  Filled 2019-12-18: qty 20000

## 2019-12-18 MED ORDER — GADOBUTROL 1 MMOL/ML IV SOLN
8.0000 mL | Freq: Once | INTRAVENOUS | Status: AC | PRN
  Administered 2019-12-18: 8 mL via INTRAVENOUS

## 2019-12-18 MED ORDER — SODIUM CHLORIDE 0.9 % IV SOLN
INTRAVENOUS | Status: DC | PRN
  Administered 2019-12-18: 500 mL

## 2019-12-18 MED ORDER — ALBUTEROL SULFATE HFA 108 (90 BASE) MCG/ACT IN AERS: 2.0000 | INHALATION_SPRAY | Freq: Four times a day (QID) | RESPIRATORY_TRACT | Status: DC | PRN

## 2019-12-18 MED ORDER — FOLIC ACID 1 MG PO TABS
1.0000 mg | ORAL_TABLET | Freq: Every day | ORAL | Status: DC
  Administered 2019-12-19 – 2019-12-25 (×7): 1 mg via ORAL
  Filled 2019-12-18 (×7): qty 1

## 2019-12-18 MED ORDER — THIAMINE HCL 100 MG/ML IJ SOLN
100.0000 mg | Freq: Every day | INTRAMUSCULAR | Status: DC
  Administered 2019-12-19 – 2019-12-21 (×3): 100 mg via INTRAVENOUS
  Filled 2019-12-18 (×3): qty 2

## 2019-12-18 MED ORDER — ROCURONIUM BROMIDE 50 MG/5ML IV SOSY
PREFILLED_SYRINGE | INTRAVENOUS | Status: DC | PRN
  Administered 2019-12-18: 30 mg via INTRAVENOUS
  Administered 2019-12-18: 40 mg via INTRAVENOUS
  Administered 2019-12-18: 60 mg via INTRAVENOUS

## 2019-12-18 MED ORDER — ENOXAPARIN SODIUM 40 MG/0.4ML ~~LOC~~ SOLN: 40.0000 mg | SUBCUTANEOUS | Status: DC

## 2019-12-18 MED ORDER — GABAPENTIN 300 MG PO CAPS
300.0000 mg | ORAL_CAPSULE | Freq: Every day | ORAL | Status: DC
  Administered 2019-12-18: 300 mg

## 2019-12-18 MED ORDER — FENTANYL CITRATE (PF) 100 MCG/2ML IJ SOLN
INTRAMUSCULAR | Status: AC
  Filled 2019-12-18: qty 2

## 2019-12-18 MED ORDER — FENTANYL CITRATE (PF) 100 MCG/2ML IJ SOLN
25.0000 ug | INTRAMUSCULAR | Status: DC | PRN
  Administered 2019-12-18 – 2019-12-19 (×4): 100 ug via INTRAVENOUS
  Filled 2019-12-18 (×2): qty 2

## 2019-12-18 MED ORDER — LIDOCAINE 2% (20 MG/ML) 5 ML SYRINGE
INTRAMUSCULAR | Status: DC | PRN
  Administered 2019-12-18: 100 mg via INTRAVENOUS

## 2019-12-18 MED ORDER — PHENYLEPHRINE 40 MCG/ML (10ML) SYRINGE FOR IV PUSH (FOR BLOOD PRESSURE SUPPORT)
PREFILLED_SYRINGE | INTRAVENOUS | Status: DC | PRN
  Administered 2019-12-18 (×2): 80 ug via INTRAVENOUS

## 2019-12-18 MED ORDER — LORAZEPAM 2 MG/ML IJ SOLN
INTRAMUSCULAR | Status: AC
  Filled 2019-12-18: qty 1

## 2019-12-18 MED ORDER — SODIUM CHLORIDE 0.9 % IV SOLN
1000.0000 mL | INTRAVENOUS | Status: DC
  Administered 2019-12-18 – 2019-12-19 (×2): 1000 mL via INTRAVENOUS

## 2019-12-18 MED ORDER — GABAPENTIN 100 MG PO CAPS: 200.0000 mg | ORAL_CAPSULE | Freq: Every day | ORAL | Status: DC | PRN

## 2019-12-18 MED ORDER — OXYCODONE HCL 5 MG PO TABS
5.0000 mg | ORAL_TABLET | Freq: Four times a day (QID) | ORAL | Status: DC
  Administered 2019-12-18: 5 mg via ORAL
  Filled 2019-12-18: qty 1

## 2019-12-18 MED ORDER — ALBUTEROL SULFATE (2.5 MG/3ML) 0.083% IN NEBU: 2.5000 mg | INHALATION_SOLUTION | RESPIRATORY_TRACT | Status: DC | PRN

## 2019-12-18 SURGICAL SUPPLY — 61 items
ADH SKN CLS APL DERMABOND .7 (GAUZE/BANDAGES/DRESSINGS) ×1
APL SKNCLS STERI-STRIP NONHPOA (GAUZE/BANDAGES/DRESSINGS)
BAG DECANTER FOR FLEXI CONT (MISCELLANEOUS) ×2 IMPLANT
BENZOIN TINCTURE PRP APPL 2/3 (GAUZE/BANDAGES/DRESSINGS) IMPLANT
BLADE SURG 11 STRL SS (BLADE) ×2 IMPLANT
BUR ACRON 5.0MM COATED (BURR) IMPLANT
BUR MATCHSTICK NEURO 3.0 LAGG (BURR) IMPLANT
CANISTER SUCT 3000ML PPV (MISCELLANEOUS) ×2 IMPLANT
CARTRIDGE OIL MAESTRO DRILL (MISCELLANEOUS) ×1 IMPLANT
CLIP VESOCCLUDE MED 6/CT (CLIP) IMPLANT
COVER WAND RF STERILE (DRAPES) ×2 IMPLANT
DERMABOND ADVANCED (GAUZE/BANDAGES/DRESSINGS) ×1
DERMABOND ADVANCED .7 DNX12 (GAUZE/BANDAGES/DRESSINGS) IMPLANT
DIFFUSER DRILL AIR PNEUMATIC (MISCELLANEOUS) ×2 IMPLANT
DRAPE C-ARM 42X120 X-RAY (DRAPES) ×2 IMPLANT
DRAPE LAPAROTOMY 100X72 PEDS (DRAPES) IMPLANT
DRAPE LAPAROTOMY 100X72X124 (DRAPES) IMPLANT
DRAPE MICROSCOPE LEICA (MISCELLANEOUS) IMPLANT
ELECT REM PT RETURN 9FT ADLT (ELECTROSURGICAL) ×2
ELECTRODE REM PT RTRN 9FT ADLT (ELECTROSURGICAL) ×1 IMPLANT
GAUZE 4X4 16PLY RFD (DISPOSABLE) IMPLANT
GAUZE SPONGE 4X4 12PLY STRL (GAUZE/BANDAGES/DRESSINGS) IMPLANT
GLOVE BIO SURGEON STRL SZ7.5 (GLOVE) ×2 IMPLANT
GLOVE BIOGEL PI IND STRL 7.5 (GLOVE) ×1 IMPLANT
GLOVE BIOGEL PI INDICATOR 7.5 (GLOVE) ×1
GLOVE EXAM NITRILE XL STR (GLOVE) IMPLANT
GOWN STRL REUS W/ TWL LRG LVL3 (GOWN DISPOSABLE) IMPLANT
GOWN STRL REUS W/ TWL XL LVL3 (GOWN DISPOSABLE) IMPLANT
GOWN STRL REUS W/TWL 2XL LVL3 (GOWN DISPOSABLE) IMPLANT
GOWN STRL REUS W/TWL LRG LVL3 (GOWN DISPOSABLE)
GOWN STRL REUS W/TWL XL LVL3 (GOWN DISPOSABLE)
HEMOSTAT SURGICEL 2X14 (HEMOSTASIS) IMPLANT
KIT BASIN OR (CUSTOM PROCEDURE TRAY) ×2 IMPLANT
KIT TURNOVER KIT B (KITS) ×2 IMPLANT
NDL SPNL 18GX3.5 QUINCKE PK (NEEDLE) IMPLANT
NDL SPNL 22GX3.5 QUINCKE BK (NEEDLE) ×1 IMPLANT
NEEDLE SPNL 18GX3.5 QUINCKE PK (NEEDLE) ×2 IMPLANT
NEEDLE SPNL 22GX3.5 QUINCKE BK (NEEDLE) ×2 IMPLANT
NS IRRIG 1000ML POUR BTL (IV SOLUTION) ×2 IMPLANT
OIL CARTRIDGE MAESTRO DRILL (MISCELLANEOUS) ×2
PACK LAMINECTOMY NEURO (CUSTOM PROCEDURE TRAY) ×2 IMPLANT
PAD ARMBOARD 7.5X6 YLW CONV (MISCELLANEOUS) ×6 IMPLANT
PATTIES SURGICAL .25X.25 (GAUZE/BANDAGES/DRESSINGS) IMPLANT
PATTIES SURGICAL .5 X3 (DISPOSABLE) IMPLANT
PATTIES SURGICAL 1/4 X 3 (GAUZE/BANDAGES/DRESSINGS) IMPLANT
RUBBERBAND STERILE (MISCELLANEOUS) IMPLANT
SPECIMEN JAR SMALL (MISCELLANEOUS) IMPLANT
SPONGE LAP 4X18 RFD (DISPOSABLE) IMPLANT
SPONGE NEURO XRAY DETECT 1X3 (DISPOSABLE) IMPLANT
SPONGE SURGIFOAM ABS GEL 100 (HEMOSTASIS) ×2 IMPLANT
STRIP CLOSURE SKIN 1/2X4 (GAUZE/BANDAGES/DRESSINGS) IMPLANT
SUT PROLENE 6 0 BV (SUTURE) IMPLANT
SUT VIC AB 0 CT1 18XCR BRD8 (SUTURE) ×1 IMPLANT
SUT VIC AB 0 CT1 8-18 (SUTURE) ×2
SUT VIC AB 2-0 CP2 18 (SUTURE) ×2 IMPLANT
SWAB COLLECTION DEVICE MRSA (MISCELLANEOUS) IMPLANT
SWAB CULTURE ESWAB REG 1ML (MISCELLANEOUS) IMPLANT
TOWEL GREEN STERILE (TOWEL DISPOSABLE) ×2 IMPLANT
TOWEL GREEN STERILE FF (TOWEL DISPOSABLE) ×2 IMPLANT
TRAY FOLEY MTR SLVR 16FR STAT (SET/KITS/TRAYS/PACK) IMPLANT
WATER STERILE IRR 1000ML POUR (IV SOLUTION) ×2 IMPLANT

## 2019-12-18 NOTE — Progress Notes (Signed)
PHARMACY - PHYSICIAN COMMUNICATION CRITICAL VALUE ALERT - BLOOD CULTURE IDENTIFICATION (BCID)  Kent Flowers is an 73 y.o. male who presented to [facility] on 12/17/2019 with a chief complaint of back pain and fever. He has a thoracic epidural abscess on imaging, and he now has one set of blood cultures growing Gram positive cocci in clusters in both bottles.  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 Sheppard Coil  Current antibiotics: Vancomycin, Aztreonam, and Metronidazole  Changes to prescribed antibiotics recommended:  Continue pending further culture results Note he tolerates penicillins so there may be an opportunity to use a beta lactam for him once culture results are available.  No results found for this or any previous visit.  Legrand Como, PharmD, BCPS, BCIDP Clinical Pharmacist Phone: [phone] Please check AMION for all [place] numbers 12/18/2019, 1:35 PM

## 2019-12-18 NOTE — ED Notes (Addendum)
Pt desatted to 88% on RA RN placed pt on 2L [place]. Pt O2 sat >95%.

## 2019-12-18 NOTE — Transfer of Care (Signed)
Immediate Anesthesia Transfer of Care Note  Patient: Kent Flowers  Procedure(s) Performed: THORACIC LAMINECTOMY FOR EPIDURAL ABSCESS Thoracic Two, Thoracic Three (N/A Spine Thoracic)  Patient Location: ICU  Anesthesia Type:General  Level of Consciousness: Patient remains intubated per anesthesia plan  Airway & Oxygen Therapy: Patient remains intubated per anesthesia plan and Patient placed on Ventilator (see vital sign flow sheet for setting)  Post-op Assessment: Report given to RN and Post -op Vital signs reviewed and stable  Post vital signs: Reviewed and stable  Last Vitals:  Vitals Value Taken Time  BP 129/81 12/18/19 1805  Temp    Pulse    Resp 15 12/18/19 1812  SpO2 97 % 12/18/19 1812  Vitals shown include unvalidated device data.  Last Pain:  Vitals:   12/18/19 1506  TempSrc: Oral  PainSc:          Complications: No apparent anesthesia complications

## 2019-12-18 NOTE — ED Notes (Signed)
Neurosurgery and admitting at bedside. Pt had removed o2 and was sitting at end of bed.

## 2019-12-18 NOTE — Anesthesia Procedure Notes (Addendum)
Procedure Name: Intubation Date/Time: 12/18/2019 3:47 PM Performed by: Alain Marion, CRNA Pre-anesthesia Checklist: Patient identified, Emergency Drugs available, Suction available and Patient being monitored Patient Re-evaluated:Patient Re-evaluated prior to induction Oxygen Delivery Method: Circle System Utilized Preoxygenation: Pre-oxygenation with 100% oxygen Induction Type: IV induction Laryngoscope Size: Miller and 2 Grade View: Grade I Tube type: Oral Number of attempts: 1 Airway Equipment and Method: Stylet Placement Confirmation: ETT inserted through vocal cords under direct vision,  positive ETCO2 and breath sounds checked- equal and bilateral Secured at: 22 cm Tube secured with: Tape Dental Injury: Teeth and Oropharynx as per pre-operative assessment
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
## 2019-12-18 NOTE — Progress Notes (Signed)
Patient to 4N21 at 1759, neuro assessment as charted. No belongings in room with patient.  Candy Sledge, RN

## 2019-12-18 NOTE — Anesthesia Preprocedure Evaluation (Addendum)
Anesthesia Evaluation  Patient identified by MRN, date of birth, ID band Patient awake  General Assessment Comment:Septic, blood cultures positive  Thoracic epidural abcess  Patient diaphoretic and agitated, early stages of DT's   Reviewed: Allergy & Precautions, NPO status , Patient's Chart, lab work & pertinent test results  Airway Mallampati: II  TM Distance: >3 FB Neck ROM: Full    Dental  (+) Edentulous Upper, Edentulous Lower   Pulmonary COPD, former smoker,    Pulmonary exam normal breath sounds clear to auscultation       Cardiovascular + Peripheral Vascular Disease   Rhythm:Regular Rate:Tachycardia     Neuro/Psych negative neurological ROS  negative psych ROS   GI/Hepatic negative GI ROS, (+)     substance abuse  alcohol use, 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 on [id number]  alcoholic   Endo/Other  negative endocrine ROS  Renal/GU negative Renal ROS  negative genitourinary   Musculoskeletal negative musculoskeletal ROS (+)   Abdominal   Peds negative pediatric ROS (+)  Hematology negative hematology ROS (+)   Anesthesia Other Findings   Reproductive/Obstetrics negative OB ROS                            Anesthesia Physical Anesthesia Plan  ASA: IV and emergent  Anesthesia Plan: General   Post-op Pain Management:    Induction: Intravenous and Rapid sequence  PONV Risk Score and Plan: 2 and Ondansetron, Dexamethasone and Treatment may vary due to age or medical condition  Airway Management Planned: Oral ETT  Additional Equipment:   Intra-op Plan:   Post-operative Plan: Possible Post-op intubation/ventilation  Informed Consent: I have reviewed the patients History and Physical, chart, labs and discussed the procedure including the risks, benefits and alternatives for the proposed anesthesia with the patient or authorized representative who has indicated his/her understanding and acceptance.      Dental advisory given  Plan Discussed with: CRNA and Surgeon  Anesthesia Plan Comments:         Anesthesia Quick Evaluation
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
## 2019-12-18 NOTE — ED Notes (Signed)
Patient transported to MRI 

## 2019-12-18 NOTE — Op Note (Signed)
PATIENT: Kent Flowers  DAY OF SURGERY: 12/18/19   PRE-OPERATIVE DIAGNOSIS:  Thoracic spinal epidural abscess   POST-OPERATIVE DIAGNOSIS:  Thoracic spinal epidural abscess   PROCEDURE:  T2, T3 laminectomies for evacuation of spinal epidural abscess   SURGEON:  Surgeon(s) and Role:    [name], MD - Primary   ANESTHESIA: ETGA   BRIEF HISTORY: This is a 73 year old man that presented with RUE weakness and severe back pain. MRI showed a likely thoracic abscess. This was discussed with the patient as well as risks, benefits, and alternatives and wished to proceed with surgery.   OPERATIVE DETAIL: The patient was taken to the operating room and anesthesia was induced by the anesthesia team. They were placed on the OR table in the prone position with padding of all pressure points. A formal time out was performed with two patient identifiers and confirmed the operative site. The operative site was marked, hair was clipped with surgical clippers, the area was then prepped and draped in a sterile fashion. Fluoro was used to localize the operative level and a midline incision was placed to expose from T2 to T3. Subperiosteal dissection was performed bilaterally and fluoroscopy was again used to confirm the surgical level.   Decompression was performed, which consisted of laminectomies at T2 and T3 using a combination of high speed drill and rongeurs. There was immediate expression of purulent material as soon as I opened the ligamentum flavum, consistent with epidural abscess. Cultures were taken from the epidural collection. The loculated collection was probed to maximize drainage and the visible portions of phlegmon were resected.   All instrument and sponge counts were correct, hemostasis was confirmed, the incision was then closed in layers. The patient was then returned to anesthesia for emergence. No apparent complications at the completion of the procedure.   EBL:  250mL    DRAINS: none   SPECIMENS: Epidural abscess cultures, aerobic and anaerobic   [name], MD 12/18/19 2:17 PM
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
## 2019-12-18 NOTE — Progress Notes (Signed)
Pharmacy Antibiotic Note  Kent Flowers is a 73 y.o. male admitted on 12/17/2019 with sepsis.  Pharmacy has been consulted for Vancomycin/Aztreonam dosing. WBC is elevated. Renal function ok.   Plan: Vancomycin 750 mg IV q12h >>Estimated AUC: 484 Aztreonam 2g IV q8h Trend WBC, temp, renal function  F/U infectious work-up Drug levels as indicated   Height: 5\' 8"  (172.7 cm) Weight: 170 lb (77.1 kg) IBW/kg (Calculated) : 68.4  Temp (24hrs), Avg:100.4 F (38 C), Min:99.5 F (37.5 C), Max:102.5 F (39.2 C)  Recent Labs  Lab 12/14/19 1459 12/18/19 0034 12/18/19 0222  WBC 17.8* 22.8*  --   CREATININE 0.89 1.04  --   LATICACIDVEN  --  1.4 1.1    Estimated Creatinine Clearance: 62.1 mL/min (by C-G formula based on SCr of 1.04 mg/dL).    Allergies  Allergen Reactions  . Ceclor [Cefaclor] Rash  . Sulfa Antibiotics Rash and Other (See Comments)    SEVERE RASH- childhood allergy   Narda Bonds, PharmD, BCPS Clinical Pharmacist Phone: [phone]

## 2019-12-18 NOTE — ED Notes (Signed)
Daughter called and stated pt may leave ama.  She states that he has been altered since Sat and she feels he is getting worse. MD paged again.

## 2019-12-18 NOTE — Progress Notes (Signed)
[place] Progress Note Patient Name: MERCEDES GOODLOW DOB: 1946-01-03 MRN: [id number]   Date of Service  12/18/2019  HPI/Events of Note  Multiple issues: 1. Patient returns from OR with Foley Catheter. No order for Foley Catheter and 2. Notified of need for DVT prophylaxis.   eICU Interventions  Will order: 1. Continue Foley Catheter.  2. Place SCD's to bilateral LE's.     Intervention Category Major Interventions: Other: Intermediate Interventions: Best-practice therapies (e.g. DVT, beta blocker, etc.)  Dmoni Fortson [place] 12/18/2019, 10:24 PM

## 2019-12-18 NOTE — Brief Op Note (Signed)
12/18/2019  6:11 PM  PATIENT:  Reggie Pile Neidlinger  73 y.o. male  PRE-OPERATIVE DIAGNOSIS:  Thoracic Epidural Abscess  POST-OPERATIVE DIAGNOSIS:  Thoracic Epidural Abscess  PROCEDURE:  Procedure(s) with comments: THORACIC LAMINECTOMY FOR EPIDURAL ABSCESS Thoracic Two, Thoracic Three (N/A) - THORACIC LAMINECTOMY FOR EPIDURAL ABSCESS Thoracic Two, Thoracic Three  SURGEON:  Surgeon(s) and Role:    * Zuleima Haser, Joyice Faster, MD - Primary  PHYSICIAN ASSISTANT:   ASSISTANTS: none   ANESTHESIA:   general  EBL:  250cc  BLOOD ADMINISTERED:none  DRAINS: none   LOCAL MEDICATIONS USED:  LIDOCAINE   SPECIMEN:  Source of Specimen:  Epidural abscess cultures  DISPOSITION OF SPECIMEN:  Microbiology  COUNTS:  YES  TOURNIQUET:  * No tourniquets in log *  DICTATION: .Note written in EPIC  PLAN OF CARE: Admit to inpatient   PATIENT DISPOSITION:  ICU - extubated and stable.   Delay start of Pharmacological VTE agent (>24hrs) due to surgical blood loss or risk of bleeding: yes

## 2019-12-19 ENCOUNTER — Other Ambulatory Visit (HOSPITAL_COMMUNITY): Payer: Medicare Other

## 2019-12-19 ENCOUNTER — Inpatient Hospital Stay (HOSPITAL_COMMUNITY): Payer: Medicare Other

## 2019-12-19 DIAGNOSIS — R7881 Bacteremia: Secondary | ICD-10-CM

## 2019-12-19 DIAGNOSIS — Z9889 Other specified postprocedural states: Secondary | ICD-10-CM

## 2019-12-19 DIAGNOSIS — M25511 Pain in right shoulder: Secondary | ICD-10-CM

## 2019-12-19 DIAGNOSIS — M5440 Lumbago with sciatica, unspecified side: Secondary | ICD-10-CM

## 2019-12-19 DIAGNOSIS — F329 Major depressive disorder, single episode, unspecified: Secondary | ICD-10-CM

## 2019-12-19 DIAGNOSIS — G8929 Other chronic pain: Secondary | ICD-10-CM

## 2019-12-19 DIAGNOSIS — Z881 Allergy status to other antibiotic agents status: Secondary | ICD-10-CM

## 2019-12-19 DIAGNOSIS — Z79891 Long term (current) use of opiate analgesic: Secondary | ICD-10-CM

## 2019-12-19 DIAGNOSIS — Z87891 Personal history of nicotine dependence: Secondary | ICD-10-CM

## 2019-12-19 DIAGNOSIS — R000 Tachycardia, unspecified: Secondary | ICD-10-CM

## 2019-12-19 LAB — HEMOGLOBIN A1C
Hgb A1c MFr Bld: 5.4 % (ref 4.8–5.6)
Mean Plasma Glucose: 108.28 mg/dL

## 2019-12-19 LAB — CBC
HCT: 40.4 % (ref 39.0–52.0)
Hemoglobin: 13.5 g/dL (ref 13.0–17.0)
MCH: 32.1 pg (ref 26.0–34.0)
MCHC: 33.4 g/dL (ref 30.0–36.0)
MCV: 96 fL (ref 80.0–100.0)
Platelets: 195 10*3/uL (ref 150–400)
RBC: 4.21 MIL/uL — ABNORMAL LOW (ref 4.22–5.81)
RDW: 13.3 % (ref 11.5–15.5)
WBC: 20.7 10*3/uL — ABNORMAL HIGH (ref 4.0–10.5)
nRBC: 0 % (ref 0.0–0.2)

## 2019-12-19 LAB — POCT I-STAT 7, (LYTES, BLD GAS, ICA,H+H)
Acid-Base Excess: 3 mmol/L — ABNORMAL HIGH (ref 0.0–2.0)
Bicarbonate: 25.4 mmol/L (ref 20.0–28.0)
Calcium, Ion: 1.09 mmol/L — ABNORMAL LOW (ref 1.15–1.40)
HCT: 38 % — ABNORMAL LOW (ref 39.0–52.0)
Hemoglobin: 12.9 g/dL — ABNORMAL LOW (ref 13.0–17.0)
O2 Saturation: 98 %
Patient temperature: 101
Potassium: 3.7 mmol/L (ref 3.5–5.1)
Sodium: 136 mmol/L (ref 135–145)
TCO2: 26 mmol/L (ref 22–32)
pCO2 arterial: 34.7 mmHg (ref 32.0–48.0)
pH, Arterial: 7.478 — ABNORMAL HIGH (ref 7.350–7.450)
pO2, Arterial: 103 mmHg (ref 83.0–108.0)

## 2019-12-19 LAB — ECHOCARDIOGRAM COMPLETE
Height: 68 in
Weight: 2832.47 oz

## 2019-12-19 LAB — GLUCOSE, CAPILLARY
Glucose-Capillary: 161 mg/dL — ABNORMAL HIGH (ref 70–99)
Glucose-Capillary: 189 mg/dL — ABNORMAL HIGH (ref 70–99)

## 2019-12-19 LAB — BASIC METABOLIC PANEL
Anion gap: 12 (ref 5–15)
BUN: 19 mg/dL (ref 8–23)
CO2: 23 mmol/L (ref 22–32)
Calcium: 7.9 mg/dL — ABNORMAL LOW (ref 8.9–10.3)
Chloride: 103 mmol/L (ref 98–111)
Creatinine, Ser: 0.8 mg/dL (ref 0.61–1.24)
GFR calc Af Amer: >60 mL/min (ref >60)
GFR calc non Af Amer: >60 mL/min (ref >60)
Glucose, Bld: 158 mg/dL — ABNORMAL HIGH (ref 70–99)
Potassium: 3.7 mmol/L (ref 3.5–5.1)
Sodium: 138 mmol/L (ref 135–145)

## 2019-12-19 LAB — URINE CULTURE: Culture: >=100000 — AB

## 2019-12-19 MED ORDER — INSULIN ASPART 100 UNIT/ML ~~LOC~~ SOLN: 0.0000 [IU] | Freq: Every day | SUBCUTANEOUS | Status: DC

## 2019-12-19 MED ORDER — VANCOMYCIN HCL IN DEXTROSE 1-5 GM/200ML-% IV SOLN
1000.0000 mg | Freq: Two times a day (BID) | INTRAVENOUS | Status: DC
  Administered 2019-12-19 – 2019-12-20 (×2): 1000 mg via INTRAVENOUS
  Filled 2019-12-19 (×2): qty 200

## 2019-12-19 MED ORDER — DOCUSATE SODIUM 50 MG/5ML PO LIQD
100.0000 mg | Freq: Two times a day (BID) | ORAL | Status: DC | PRN
  Administered 2019-12-20: 04:00:00 100 mg via ORAL
  Filled 2019-12-19: qty 10

## 2019-12-19 MED ORDER — OXYCODONE HCL 5 MG PO TABS
5.0000 mg | ORAL_TABLET | ORAL | Status: DC | PRN
  Administered 2019-12-19 – 2019-12-25 (×13): 5 mg via ORAL
  Filled 2019-12-19 (×15): qty 1

## 2019-12-19 MED ORDER — INSULIN ASPART 100 UNIT/ML ~~LOC~~ SOLN
0.0000 [IU] | Freq: Three times a day (TID) | SUBCUTANEOUS | Status: DC
  Administered 2019-12-19: 17:00:00 2 [IU] via SUBCUTANEOUS
  Administered 2019-12-20: 09:00:00 1 [IU] via SUBCUTANEOUS
  Administered 2019-12-20: 17:00:00 2 [IU] via SUBCUTANEOUS
  Administered 2019-12-20 – 2019-12-21 (×3): 1 [IU] via SUBCUTANEOUS
  Administered 2019-12-21 – 2019-12-22 (×2): 2 [IU] via SUBCUTANEOUS
  Administered 2019-12-22: 1 [IU] via SUBCUTANEOUS
  Administered 2019-12-23 – 2019-12-24 (×2): 2 [IU] via SUBCUTANEOUS
  Administered 2019-12-24 – 2019-12-25 (×3): 1 [IU] via SUBCUTANEOUS

## 2019-12-19 MED ORDER — GABAPENTIN 300 MG PO CAPS
300.0000 mg | ORAL_CAPSULE | Freq: Every day | ORAL | Status: DC
  Administered 2019-12-19 – 2019-12-23 (×5): 300 mg via ORAL
  Filled 2019-12-19 (×5): qty 1

## 2019-12-19 MED ORDER — OXYCODONE HCL 5 MG PO TABS: 10.0000 mg | ORAL_TABLET | Freq: Four times a day (QID) | ORAL | Status: DC

## 2019-12-19 MED ORDER — FENTANYL CITRATE (PF) 100 MCG/2ML IJ SOLN
50.0000 ug | INTRAMUSCULAR | Status: DC | PRN
  Administered 2019-12-19 – 2019-12-22 (×7): 50 ug via INTRAVENOUS
  Filled 2019-12-19 (×7): qty 2

## 2019-12-19 MED ORDER — OXYCODONE HCL 5 MG PO TABS
10.0000 mg | ORAL_TABLET | ORAL | Status: DC
  Administered 2019-12-19 – 2019-12-20 (×5): 10 mg via ORAL
  Filled 2019-12-19 (×5): qty 2

## 2019-12-19 MED ORDER — ORAL CARE MOUTH RINSE
15.0000 mL | Freq: Two times a day (BID) | OROMUCOSAL | Status: DC
  Administered 2019-12-20 – 2019-12-25 (×9): 15 mL via OROMUCOSAL

## 2019-12-19 NOTE — Progress Notes (Signed)
Echocardiogram attempted at 2:50, therapy in room and then patient going to MRI.
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
## 2019-12-19 NOTE — Progress Notes (Signed)
BP 125/75 (BP Location: Right Arm)   Pulse 94   Temp 98.8 F (37.1 C) (Axillary)   Resp 18   Ht 5\' 8"  (1.727 m)   Wt 80.3 kg   SpO2 93%   BMI 26.92 kg/m  Alert and oriented x 4, moving all extremities Wound is clean, dry, no signs of infection  Doing well post op

## 2019-12-19 NOTE — Progress Notes (Signed)
Pharmacy Antibiotic Note  Kent Flowers is a 74 y.o. male admitted on 12/17/2019 with an epidural abscess.  Pharmacy has been consulted for Vancomycin dosing.  ID: epidural abscess, likely GPC bacteremia -  Tolerates penicillins so should be able to de-escalate ABX once culture data available - Tmax 101.3. WBC down 20.7,Scr 0.8 down  12/31Vanco>>  12/31: BC x 2: SA 12/31: UCx: SA 12/31: MRSA PCR negative  Vancomycin 1000 mg IV Q 12 hrs. Goal AUC 400-550. Expected AUC: 484.3 SCr used: 0.8  Plan: Increase Vanco to 1g IV q 12hrs. Awaiting culture sensitivities Levels at steady state    Height: 5\' 8"  (172.7 cm) Weight: 177 lb 0.5 oz (80.3 kg) IBW/kg (Calculated) : 68.4  Temp (24hrs), Avg:99.7 F (37.6 C), Min:98.8 F (37.1 C), Max:101.3 F (38.5 C)  Recent Labs  Lab 12/14/19 1459 12/18/19 0034 12/18/19 0222 12/19/19 0408  WBC 17.8* 22.8*  --  20.7*  CREATININE 0.89 1.04  --  0.80  LATICACIDVEN  --  1.4 1.1  --     Estimated Creatinine Clearance: 80.8 mL/min (by C-G formula based on SCr of 0.8 mg/dL).    Allergies  Allergen Reactions  . Ceclor [Cefaclor] Rash  . Sulfa Antibiotics Rash and Other (See Comments)    SEVERE RASH- childhood allergy    Kent Flowers S. Alford Highland, PharmD, BCPS Clinical Staff Pharmacist [email or web address] Kent Flowers 12/19/2019 9:24 AM

## 2019-12-19 NOTE — Progress Notes (Signed)
  Echocardiogram 2D Echocardiogram has been performed.  Kent Flowers 12/19/2019, 4:45 PM
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
## 2019-12-19 NOTE — Procedures (Signed)
Extubation Procedure Note  Patient Details:   Name: Kent Flowers DOB: 08-Apr-1946 MRN: [id number]   Airway Documentation:    Vent end date: 12/19/19 Vent end time: 0920   Evaluation  O2 sats: stable throughout Complications: No apparent complications Patient did tolerate procedure well. Bilateral Breath Sounds: Diminished   Patient extubated per MD order to 4L [name]. Patient able to speak & has good cough post extubation.  Kathie Dike 12/19/2019, 9:23 AM
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
## 2019-12-20 ENCOUNTER — Inpatient Hospital Stay (HOSPITAL_COMMUNITY): Payer: Medicare Other

## 2019-12-20 DIAGNOSIS — R7881 Bacteremia: Secondary | ICD-10-CM

## 2019-12-20 DIAGNOSIS — R222 Localized swelling, mass and lump, trunk: Secondary | ICD-10-CM

## 2019-12-20 DIAGNOSIS — K5900 Constipation, unspecified: Secondary | ICD-10-CM

## 2019-12-20 DIAGNOSIS — B9561 Methicillin susceptible Staphylococcus aureus infection as the cause of diseases classified elsewhere: Secondary | ICD-10-CM

## 2019-12-20 DIAGNOSIS — G061 Intraspinal abscess and granuloma: Secondary | ICD-10-CM

## 2019-12-20 LAB — BASIC METABOLIC PANEL
Anion gap: 11 (ref 5–15)
BUN: 16 mg/dL (ref 8–23)
CO2: 26 mmol/L (ref 22–32)
Calcium: 7.9 mg/dL — ABNORMAL LOW (ref 8.9–10.3)
Chloride: 99 mmol/L (ref 98–111)
Creatinine, Ser: 0.66 mg/dL (ref 0.61–1.24)
GFR calc Af Amer: >60 mL/min (ref >60)
GFR calc non Af Amer: >60 mL/min (ref >60)
Glucose, Bld: 152 mg/dL — ABNORMAL HIGH (ref 70–99)
Potassium: 3.3 mmol/L — ABNORMAL LOW (ref 3.5–5.1)
Sodium: 136 mmol/L (ref 135–145)

## 2019-12-20 LAB — CREATININE, SERUM
Creatinine, Ser: 0.8 mg/dL (ref 0.61–1.24)
GFR calc Af Amer: >60 mL/min (ref >60)
GFR calc non Af Amer: >60 mL/min (ref >60)

## 2019-12-20 LAB — CULTURE, BLOOD (ROUTINE X 2): Special Requests: ADEQUATE

## 2019-12-20 LAB — GLUCOSE, CAPILLARY
Glucose-Capillary: 139 mg/dL — ABNORMAL HIGH (ref 70–99)
Glucose-Capillary: 139 mg/dL — ABNORMAL HIGH (ref 70–99)
Glucose-Capillary: 162 mg/dL — ABNORMAL HIGH (ref 70–99)

## 2019-12-20 LAB — CBC
HCT: 43 % (ref 39.0–52.0)
Hemoglobin: 14.3 g/dL (ref 13.0–17.0)
MCH: 31.5 pg (ref 26.0–34.0)
MCHC: 33.3 g/dL (ref 30.0–36.0)
MCV: 94.7 fL (ref 80.0–100.0)
Platelets: 289 10*3/uL (ref 150–400)
RBC: 4.54 MIL/uL (ref 4.22–5.81)
RDW: 13.3 % (ref 11.5–15.5)
WBC: 20.7 10*3/uL — ABNORMAL HIGH (ref 4.0–10.5)
nRBC: 0 % (ref 0.0–0.2)

## 2019-12-20 LAB — MAGNESIUM: Magnesium: 2.2 mg/dL (ref 1.7–2.4)

## 2019-12-20 LAB — PHOSPHORUS: Phosphorus: 1.9 mg/dL — ABNORMAL LOW (ref 2.5–4.6)

## 2019-12-20 IMAGING — MR MR SHOULDER*R* WO/W CM
9 series · 40 of 40 positions shown · IV contrast (gadavist)
Comparison: None.

CLINICAL DATA: Right shoulder pain, clinical concern for septic
joint

EXAM:
MRI OF THE RIGHT SHOULDER WITHOUT AND WITH CONTRAST
TECHNIQUE: Multiplanar, multisequence MR imaging of the right shoulder was
performed before and after the administration of intravenous
contrast.
CONTRAST:  8mL GADAVIST GADOBUTROL 1 MMOL/ML IV SOLN

[Series 5: PD fat-sat · axial · right · 4.0mm · 0.55mm/px · z∈[-46,+57]mm · 3 of 25 slices shown (1 of 2)]
[im 1/25]
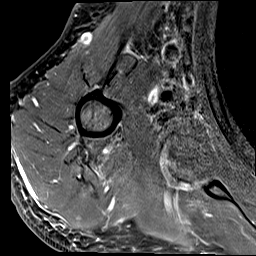
[im 13/25]
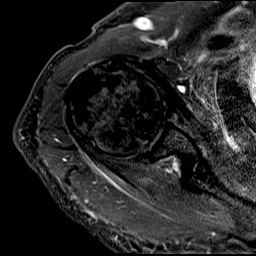
[im 25/25]
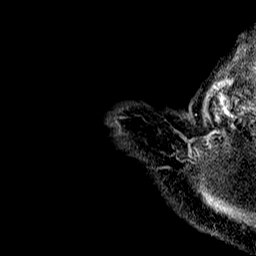

[Series 6: T2 fat-sat · oblique · right · 4.0mm · 0.62mm/px · 5 of 31 slices shown (1 of 2)]
[im 1/31]
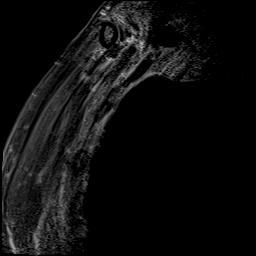
[im 8/31]
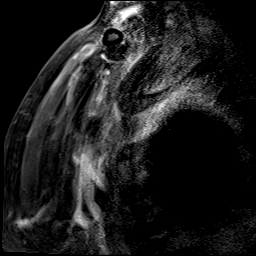
[im 16/31]
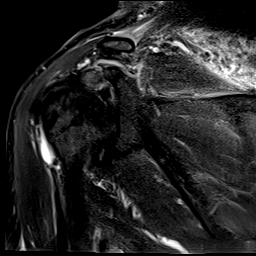
[im 23/31]
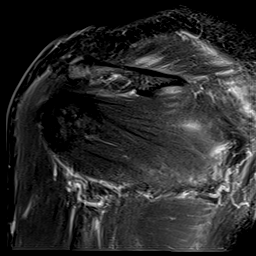
[im 31/31]
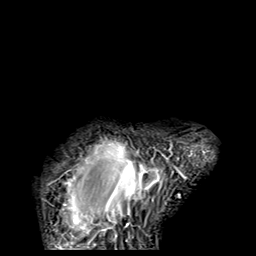

[Series 8: T2 fat-sat · oblique · right · 4.0mm · 0.62mm/px · 5 of 31 slices shown (2 of 2)]
[im 1/31]
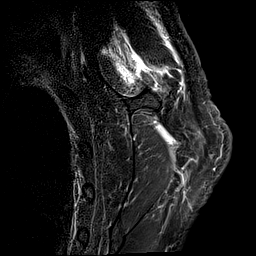
[im 8/31]
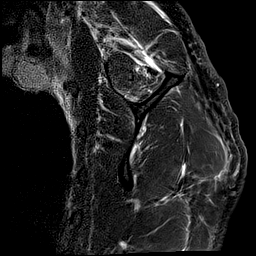
[im 16/31]
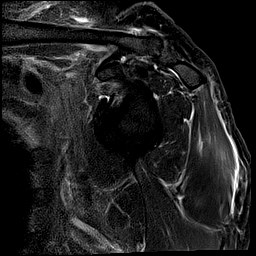
[im 23/31]
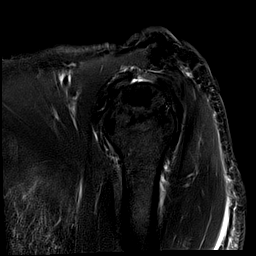
[im 31/31]
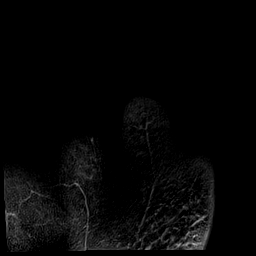

[Series 9: T1 · oblique · right · 4.0mm · 0.62mm/px · 5 of 31 slices shown]
[im 1/31]
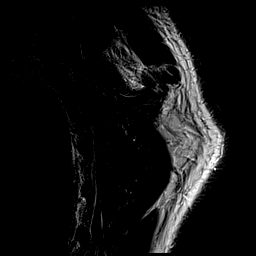
[im 8/31]
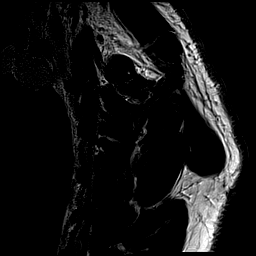
[im 16/31]
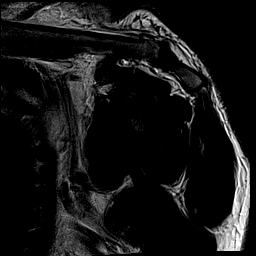
[im 23/31]
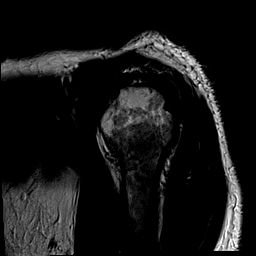
[im 31/31]
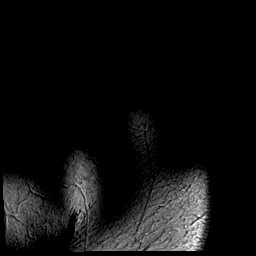

[Series 10: T1 fat-sat · axial · non-contrast · right · 4.0mm · 0.55mm/px · z∈[-46,+57]mm · 4 of 25 slices shown]
[im 1/25]
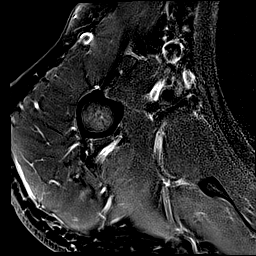
[im 9/25]
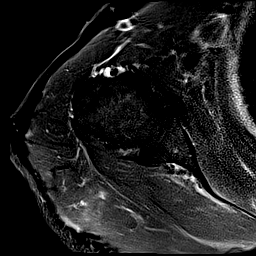
[im 17/25]
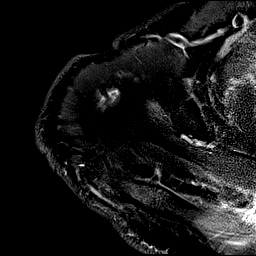
[im 25/25]
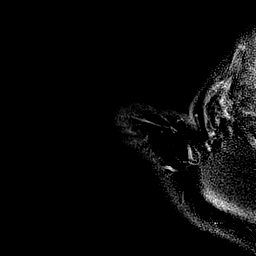

[Series 11: PD fat-sat · oblique · right · 4.0mm · 0.62mm/px · 5 of 32 slices shown (2 of 2)]
[im 1/32]
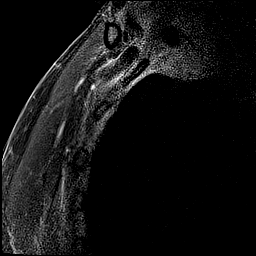
[im 8/32]
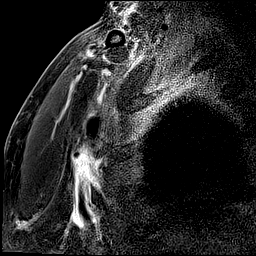
[im 16/32]
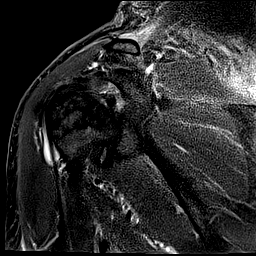
[im 24/32]
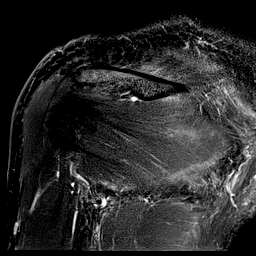
[im 32/32]
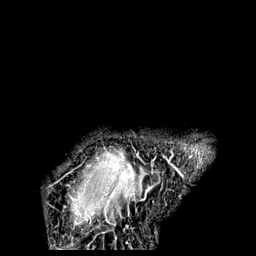

[Series 12: T1 fat-sat post-contrast · axial · right · 4.0mm · 0.55mm/px · z∈[-46,+57]mm · 4 of 25 slices shown (1 of 3)]
[im 1/25]
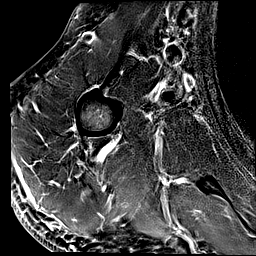
[im 9/25]
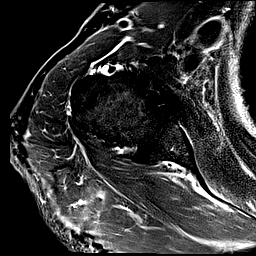
[im 17/25]
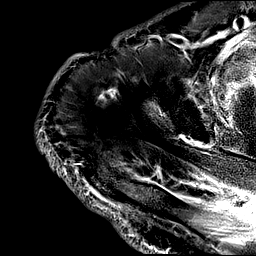
[im 25/25]
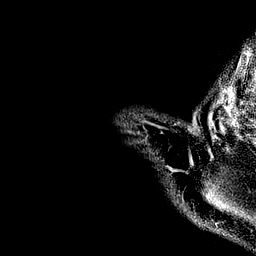

[Series 13: T1 fat-sat post-contrast · oblique · right · 4.0mm · 0.31mm/px · 5 of 33 slices shown (2 of 3)]
[im 1/33]
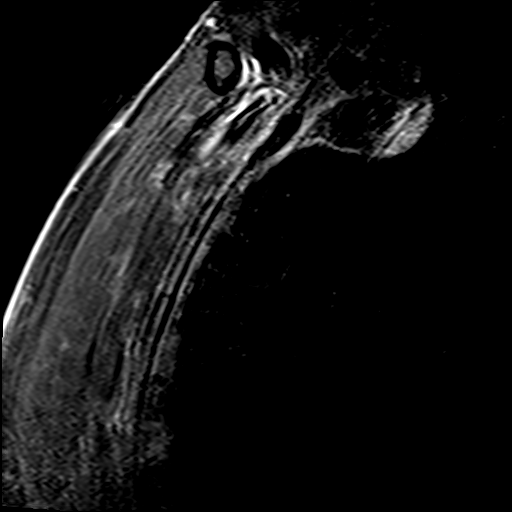
[im 9/33]
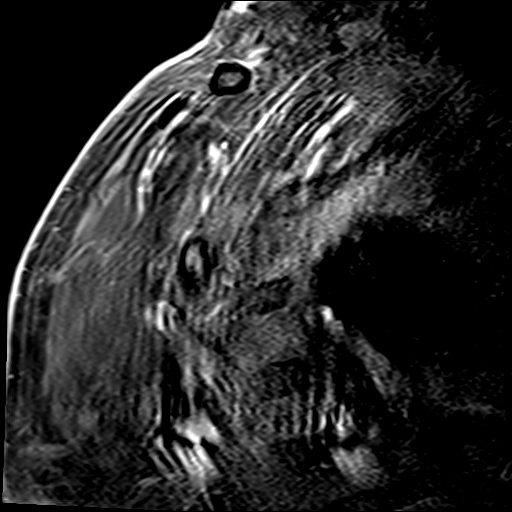
[im 17/33]
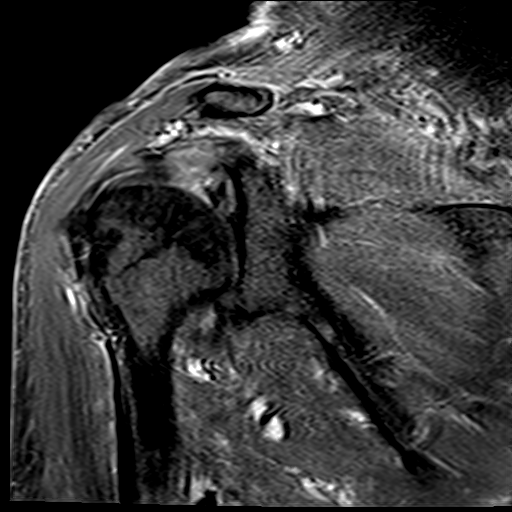
[im 25/33]
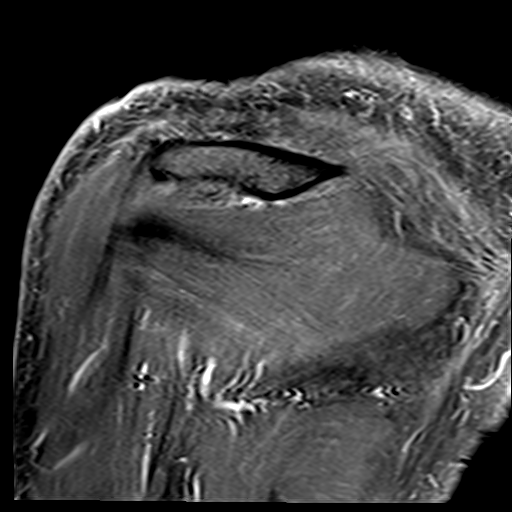
[im 33/33]
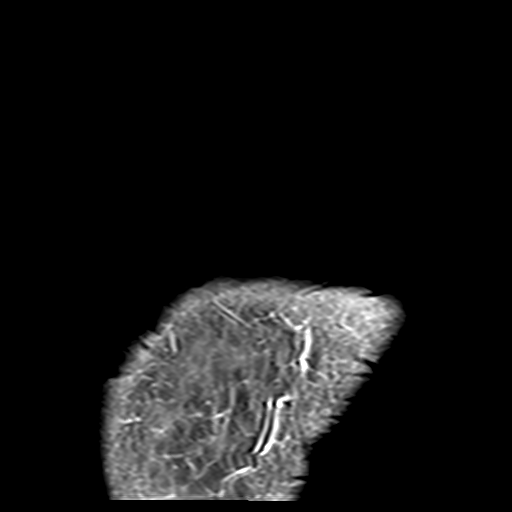

[Series 14: T1 fat-sat post-contrast · axial · right · 4.0mm · 0.55mm/px · z∈[-46,+57]mm · 4 of 25 slices shown (3 of 3)]
[im 1/25]
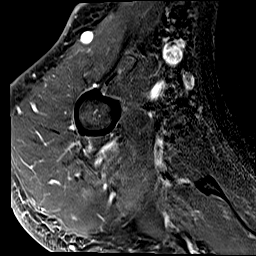
[im 9/25]
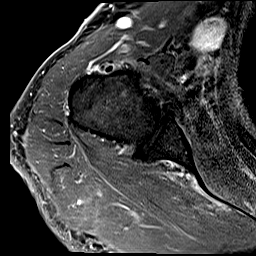
[im 17/25]
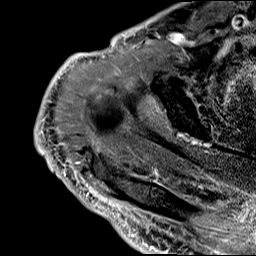
[im 25/25]
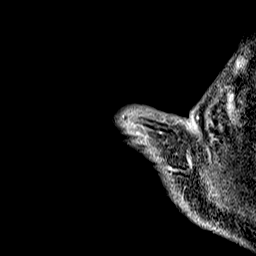

[40 of 40 positions shown; findings below may reference images not displayed]

FINDINGS: Technical note: Motion degraded examination. Motion artifact
reduction sequences were utilized. Best possible images were
obtained and submitted for interpretation.

Rotator cuff: Full thickness tear of the mid to posterior fibers of
the distal supraspinatus tendon (series 6, image 13) with retraction
to the level of the humeral head apex. The anterior most
supraspinatus fibers are intact with tendinosis. There is articular
sided fraying of the anterior infraspinatus tendon. Subscapularis
and teres minor tendons appear intact.

Muscles: Intramuscular edema within the supraspinatus tendon. No
significant atrophy or fatty infiltration of the rotator cuff
musculature.

Biceps long head:  Intact with intra-articular tendinosis.

Acromioclavicular Joint: Moderate arthropathy of the
acromioclavicular joint. Trace subacromial-subdeltoid bursal fluid.

Glenohumeral Joint: No glenohumeral joint effusion. No pericapsular
edema. There is diffuse cartilage thinning and surface irregularity.

Labrum:  Labrum is mildly degenerated.

Bones: Reactive subchondral marrow edema at the acromioclavicular
joint is favored degenerative. There are no acute fractures. No
dislocation. No bone lesion or areas of marrow replacement. No
periostitis.

Other: There is a rounded intermediate T1/T2 signal intensity mass
at the right lung apex measuring 3.0 x 2.0 cm (series 11, image 29;
series 8, image 8) which appears pleural based. No appreciable
enhancement within this mass on postcontrast images partially
visualized soft tissue edema within the right supraclavicular
region. No soft tissue fluid collections.
IMPRESSION: 1. No glenohumeral joint effusion to suggest septic arthritis, as
clinically questioned.
2. Pleural-based right apical soft tissue mass measuring 3 x 2 cm.
Findings are concerning for malignancy. Further evaluation with
PET-CT is suggested.
3. Full-thickness tear of the mid to posterior fibers of the distal
supraspinatus tendon with retraction to the level of the humeral
head apex. The anterior most supraspinatus fibers are intact with
tendinosis.
4. Intramuscular edema within the supraspinatus tendon, is likely
reactive secondary to tendon pathology. No significant atrophy or
fatty infiltration of the rotator cuff musculature.
5. Subcutaneous and soft tissue edema most pronounced in the
supraclavicular region without focal fluid collection
6. Moderate acromioclavicular and glenohumeral osteoarthritis.
7. Intra-articular biceps tendinosis.

## 2019-12-20 IMAGING — DX DG CHEST 1V PORT
1 series · 1 of 1 positions shown · non-contrast
Comparison: [DATE].

CLINICAL DATA: Respiratory failure.

EXAM:
PORTABLE CHEST 1 VIEW

[chest ap]
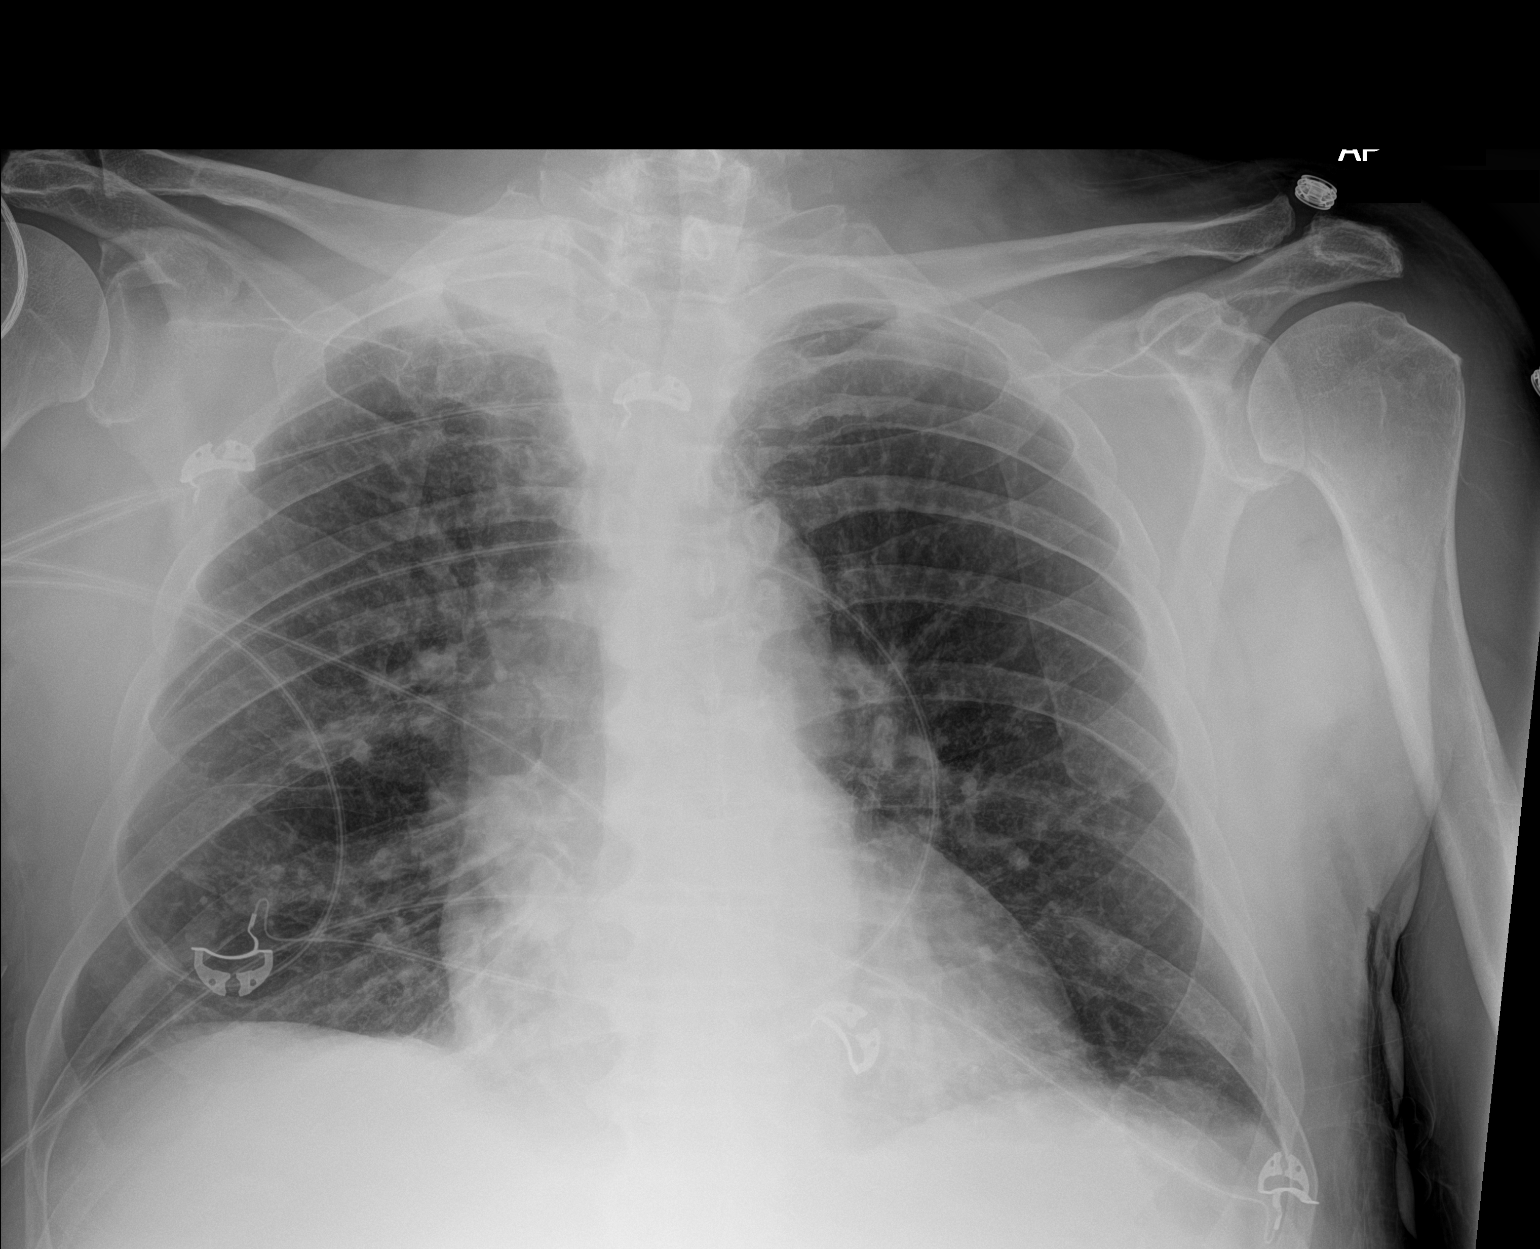

[1 of 1 positions shown; findings below may reference images not displayed]

FINDINGS: Stable cardiomediastinal silhouette. Minimal bibasilar subsegmental
atelectasis is noted. Endotracheal nasogastric tubes have been
removed. No pneumothorax or pleural effusion is noted. Bony thorax
is unremarkable.
IMPRESSION: Minimal bibasilar subsegmental atelectasis. Endotracheal and
nasogastric tube have been removed.

## 2019-12-20 MED ORDER — OXYCODONE HCL 5 MG PO TABS
15.0000 mg | ORAL_TABLET | ORAL | Status: DC
  Administered 2019-12-20 – 2019-12-24 (×20): 15 mg via ORAL
  Filled 2019-12-20 (×20): qty 3

## 2019-12-20 MED ORDER — MELATONIN 3 MG PO TABS
3.0000 mg | ORAL_TABLET | Freq: Every evening | ORAL | Status: DC | PRN
  Administered 2019-12-20 – 2019-12-24 (×3): 3 mg via ORAL
  Filled 2019-12-20 (×5): qty 1

## 2019-12-20 MED ORDER — NON FORMULARY: 3.0000 mg | Freq: Every evening | Status: DC | PRN

## 2019-12-20 MED ORDER — CEFAZOLIN SODIUM-DEXTROSE 2-4 GM/100ML-% IV SOLN
2.0000 g | Freq: Three times a day (TID) | INTRAVENOUS | Status: DC
  Administered 2019-12-20 – 2019-12-25 (×16): 2 g via INTRAVENOUS
  Filled 2019-12-20 (×20): qty 100

## 2019-12-20 MED ORDER — SODIUM CHLORIDE 0.9 % IV SOLN
1000.0000 mL | INTRAVENOUS | Status: DC
  Administered 2019-12-20: 1000 mL via INTRAVENOUS

## 2019-12-20 MED ORDER — ENOXAPARIN SODIUM 40 MG/0.4ML ~~LOC~~ SOLN
40.0000 mg | SUBCUTANEOUS | Status: DC
  Administered 2019-12-21 – 2019-12-24 (×4): 40 mg via SUBCUTANEOUS
  Filled 2019-12-20 (×5): qty 0.4

## 2019-12-20 MED ORDER — POTASSIUM PHOSPHATES 15 MMOLE/5ML IV SOLN
30.0000 mmol | Freq: Once | INTRAVENOUS | Status: AC
  Administered 2019-12-20: 30 mmol via INTRAVENOUS
  Filled 2019-12-20: qty 10

## 2019-12-20 MED ORDER — DEXTROSE 50 % IV SOLN
INTRAVENOUS | Status: AC
  Filled 2019-12-20: qty 50

## 2019-12-20 MED ORDER — HYDRALAZINE HCL 20 MG/ML IJ SOLN
10.0000 mg | Freq: Four times a day (QID) | INTRAMUSCULAR | Status: DC | PRN
  Administered 2019-12-20: 17:00:00 10 mg via INTRAVENOUS
  Filled 2019-12-20: qty 1

## 2019-12-20 MED ORDER — GADOBUTROL 1 MMOL/ML IV SOLN
8.0000 mL | Freq: Once | INTRAVENOUS | Status: AC | PRN
  Administered 2019-12-20: 09:00:00 8 mL via INTRAVENOUS

## 2019-12-20 NOTE — Progress Notes (Signed)
Pharmacy Antibiotic Note  [facility] is a 74 y.o. male admitted on 12/17/2019 with staph aureus bacteremia + epidural abscess started on vancomycin.  BCx now back with MSSA and pharmacy has been consulted for cefazolin dosing.  SCr 0.66, CrCl ~ 80 ml/min  Plan: D/c vancomycin Cefazolin 2g IV every 8 hours Monitor renal function, BCx, MRI and ID recs  Height: 5\' 8"  (172.7 cm) Weight: 177 lb 0.5 oz (80.3 kg) IBW/kg (Calculated) : 68.4  Temp (24hrs), Avg:99.6 F (37.6 C), Min:99 F (37.2 C), Max:99.9 F (37.7 C)  Recent Labs  Lab 12/14/19 1459 12/18/19 0034 12/18/19 0222 12/19/19 0408 12/20/19 0526  WBC 17.8* 22.8*  --  20.7*  --   CREATININE 0.89 1.04  --  0.80 0.66  LATICACIDVEN  --  1.4 1.1  --   --     Estimated Creatinine Clearance: 80.8 mL/min (by C-G formula based on SCr of 0.66 mg/dL).    Allergies  Allergen Reactions  . Ceclor [Cefaclor] Rash  . Sulfa Antibiotics Rash and Other (See Comments)    SEVERE RASH- childhood allergy    Bertis Ruddy, PharmD Clinical Pharmacist Please check AMION for all [place] numbers 12/20/2019 10:43 AM

## 2019-12-20 NOTE — Progress Notes (Signed)
NAME:  Kent Flowers, MRN:  [id number], DOB:  06-09-1946, LOS: 2 ADMISSION DATE:  12/17/2019, CONSULTATION DATE:  12/18/2019 REFERRING MD:  Dr. Zada Finders, CHIEF COMPLAINT:  Vent managment  Brief History   [name] presenting 12/31 with fever, fatigue and right upper extremity weakness found to have thoracic epidural abscess who went to OR for evacuation and laminectomy of T2 and T3 for spinal epidural abscess returning to ICU on mechanical ventilation.  PCCM consulted for vent management.   Past Medical History  COPD, tobacco (intermittently, quit few months ago) and ETOH use (few glasses of wine nightly), chronic back pain w/sciatica, squamous cell carcinoma, PBH, peyronie disease  Significant Hospital Events   12/31 Admit to IMTS/OR  Consults:  NSGY PCCM  Procedures:  12/31 ETT >> 12/19/2019 12/31 foley >> 12/19/2019  12/31 thoracic laminectomy for epidural abscess   Significant Diagnostic Tests:  12/18/2019 CTH >> 1. No acute intracranial abnormality. 2. Unchanged atrophy and chronic small vessel ischemia.  12/18/2019 CTA PE >> 1. Upper thoracic paravertebral stranding also involving the right-sided thoracic inlet. No underlying fracture or erosion is seen. Given there is both history of recent trauma and fever would suggest MRI to evaluate for occult injury or spinal infection. 2. Negative for pulmonary embolism. 3. Aortic Atherosclerosis (ICD10-I70.0) and Emphysema (ICD10-J43.9). Coronary atherosclerosis.  12/31 MRI cervical/ thoracic  >> Upper cervical spinous process mild marrow edema and interspinous soft tissue edema probably related to history of recent trauma.  Possible thin dorsal extension of thoracic epidural process into the cervical spine.  Multilevel degenerative changes with suboptimal evaluation due to artifact.  Echo 1/1-LVEF 50-55%, no obvious valvular vegetations  MRI right shoulder 1/2 >> No findings suggestive of septic arthritis. ? Pleural based right  apical mass  Micro Data:  12/31 SARS 2/ flu A/B >> neg 12/31 BCx 2 >> GPC clusters >> 12/31 MRSA PCR >> neg 12/31 epidural abscess culture >> 12/18/2019 urine culture with staph aureus>>  Antimicrobials:  12/31 vanc >> 12/31 aztreonam >> off 12/31 flagyl off  Interim history/subjective:  Extubated yesterday.  No acute events overnight Has remained hemodynamically stable  Objective   Blood pressure (!) 161/86, pulse 93, temperature 99.9 F (37.7 C), temperature source Oral, resp. rate 16, height 5\' 8"  (1.727 m), weight 80.3 kg, SpO2 98 %.        Intake/Output Summary (Last 24 hours) at 12/20/2019 0856 Last data filed at 12/20/2019 0700 Gross per 24 hour  Intake 1315.34 ml  Output 1350 ml  Net -34.66 ml   Filed Weights   12/18/19 0003 12/18/19 1800  Weight: 77.1 kg 80.3 kg   Examination: Gen:      No acute distress HEENT:  EOMI, sclera anicteric Neck:     No masses; no thyromegaly Lungs:    Clear to auscultation bilaterally; normal respiratory effort CV:         Regular rate and rhythm; no murmurs Abd:      + bowel sounds; soft, non-tender; no palpable masses, no distension Ext:    No edema; adequate peripheral perfusion Skin:      Warm and dry; no rash Neuro: alert and oriented x 3 Psych: normal mood and affect  Resolved Hospital Problem list    Assessment & Plan:   Thoracic epidural abscess s/p evacuation and laminectomies of T2, T3 P:  Per neurosurgery Maintain adequate pain control Neuro exams Continue vancomycin and monitor culture data  Leukocytosis with thoracic epidural abscess but likely bacteremic, prelim BCx2 showing  GPCs urine culture with staph aureus P:  Continue vancomycin and ceftriaxone Monitor wound site  Vent dependent respiratory failure secondary to anesthesia surgery with underlying COPD Respiratory insufficiency related to above Hx COPD, tobacco abuse - f/b Dr. Tarri Fuller, stopped anoro in 09/2019 as trial/ patient didn't see benefit on  it P:  Stable post extubation.  Chest x-ray today reviewed with minimal basal atelectasis. Wean down oxygen as tolerated Bronchodilators  Right apical mass P:  MRI left shoulder reviewed with right apical mass.  This was present on recent CT but not reported and is more obvious on the MRI.  Reviewed scan with the radiologist who feels that this looks more like a soft tissue mass and not an inflammatory collection or abscess. Concern for malignancy as patient is a smoker We will need PET scan as an outpatient.and follow-up in clinic with Dr. Annamaria Boots.  Acute encephalopathy related to sedation, Hx ETOH use- daily wine drinker Hx chronic pain - OA, knees/ shoulder   unclear last ETOH use, known per wife is 12/25 but she has been hospitalized P:  Empirical thiamine folic acid multivitamin Monitor for signs and symptoms of withdrawal Continue Neurontin Pain medication as needed  Best practice:  Diet: PO diet Pain/Anxiety/Delirium protocol (if indicated): NA VAP protocol (if indicated): NA DVT prophylaxis: SCDs only, add VTE when ok with NSGY GI prophylaxis: PPI Glucose control: trend on BMP Mobility: BR  Code Status: DNR  Family Communication: wife updated by phone by Dr. Orpah Melter.  She is currently hospitalized at [facility] on 6th floor with PNA and gallbladder issues.  12/19/2019 plan for extubation.  We will update wife later post extubation. Disposition: Transfer to [facility]. PCCM off 1/3  Labs   CBC: Recent Labs  Lab 12/14/19 1459 12/18/19 0034 12/18/19 1951 12/19/19 0244 12/19/19 0408  WBC 17.8* 22.8*  --   --  20.7*  NEUTROABS  --  19.4*  --   --   --   HGB 16.4 14.9 12.2* 12.9* 13.5  HCT 50.6 44.4 36.0* 38.0* 40.4  MCV 98.6 94.9  --   --  96.0  PLT 230 207  --   --  0000000    Basic Metabolic Panel: Recent Labs  Lab 12/14/19 1459 12/18/19 0034 12/18/19 1700 12/18/19 1951 12/19/19 0244 12/19/19 0408 12/20/19 0526  NA 133* 133*  --  132* 136 138 136  K 3.7 3.5  --  3.6  3.7 3.7 3.3*  CL 97* 94*  --   --   --  103 99  CO2 25 25  --   --   --  23 26  GLUCOSE 145* 137*  --   --   --  158* 152*  BUN 14 25*  --   --   --  19 16  CREATININE 0.89 1.04  --   --   --  0.80 0.66  CALCIUM 8.7* 8.8*  --   --   --  7.9* 7.9*  MG  --   --  1.9  --   --   --  2.2  PHOS  --   --  2.9  --   --   --  1.9*   GFR: Estimated Creatinine Clearance: 80.8 mL/min (by C-G formula based on SCr of 0.66 mg/dL). Recent Labs  Lab 12/14/19 1459 12/18/19 0034 12/18/19 0222 12/19/19 0408  WBC 17.8* 22.8*  --  20.7*  LATICACIDVEN  --  1.4 1.1  --     Liver Function Tests:  Recent Labs  Lab 12/18/19 0034  AST 38  ALT 27  ALKPHOS 69  BILITOT 1.2  PROT 6.6  ALBUMIN 3.0*   No results for input(s): LIPASE, AMYLASE in the last 168 hours. No results for input(s): AMMONIA in the last 168 hours.  ABG    Component Value Date/Time   PHART 7.478 (H) 12/19/2019 0244   PCO2ART 34.7 12/19/2019 0244   PO2ART 103.0 12/19/2019 0244   HCO3 25.4 12/19/2019 0244   TCO2 26 12/19/2019 0244   O2SAT 98.0 12/19/2019 0244     Coagulation Profile: Recent Labs  Lab 12/18/19 0034  INR 1.3*    Cardiac Enzymes: No results for input(s): CKTOTAL, CKMB, CKMBINDEX, TROPONINI in the last 168 hours.  HbA1C: Hgb A1c MFr Bld  Date/Time Value Ref Range Status  12/19/2019 04:08 AM 5.4 4.8 - 5.6 % Final    Comment:    (NOTE) Pre diabetes:          5.7%-6.4% Diabetes:              >6.4% Glycemic control for   <7.0% adults with diabetes   12/18/2019 06:19 PM 5.3 4.8 - 5.6 % Final    Comment:    (NOTE) Pre diabetes:          5.7%-6.4% Diabetes:              >6.4% Glycemic control for   <7.0% adults with diabetes     CBG: Recent Labs  Lab 12/19/19 1656 12/19/19 2226 12/20/19 0716  GLUCAP 161* 189* 139*    Dayveon Halley MD [facility] Please see [email or web address] for pager details.  12/20/2019, 8:59 AM

## 2019-12-20 NOTE — Progress Notes (Signed)
Subjective: Patient reports poor use of the right hand that started about 8 days ago.  He is having no trouble with his legs or his left upper extremity.  MRI of the shoulder is pending.  This was ordered by infectious disease.  Objective: Vital signs in last 24 hours: Temp:  [99 F (37.2 C)-99.9 F (37.7 C)] 99.9 F (37.7 C) (01/02 0300) Pulse Rate:  [93-125] 93 (01/02 0700) Resp:  [15-29] 16 (01/02 0700) BP: (108-183)/(56-101) 167/90 (01/02 0900) SpO2:  [91 %-98 %] 98 % (01/02 0700)  Intake/Output from previous day: 01/01 0701 - 01/02 0700 In: 1442.8 [P.O.:1000; I.V.:184; IV Piggyback:258.8] Out: 1575 [Urine:1575] Intake/Output this shift: No intake/output data recorded.  He is awake and alert and pleasant and interactive.  He moves all extremities well but he does have very little hand intrinsic strength on the right.  Lab Results: Lab Results  Component Value Date   WBC 20.7 (H) 12/19/2019   HGB 13.5 12/19/2019   HCT 40.4 12/19/2019   MCV 96.0 12/19/2019   PLT 195 12/19/2019   Lab Results  Component Value Date   INR 1.3 (H) 12/18/2019   BMET Lab Results  Component Value Date   NA 136 12/20/2019   K 3.3 (L) 12/20/2019   CL 99 12/20/2019   CO2 26 12/20/2019   GLUCOSE 152 (H) 12/20/2019   BUN 16 12/20/2019   CREATININE 0.66 12/20/2019   CALCIUM 7.9 (L) 12/20/2019    Studies/Results: DG Abd 1 View  Result Date: 12/18/2019 CLINICAL DATA:  Impaired oral gastric feeding tube EXAM: ABDOMEN - 1 VIEW COMPARISON:  None. FINDINGS: The nasogastric tube courses below the diaphragm with the side port projecting over the stomach. No dilated loops of bowel to suggest obstruction. No supine evidence for free air. Multilevel degenerative changes in the lumbar spine. IMPRESSION: 1. The nasogastric tube side port appropriately projects over the stomach. 2.  Nonobstructive bowel gas pattern. Electronically Signed   By: Audie Pinto M.D.   On: 12/18/2019 20:47   DG Thoracic  Spine 1 View  Result Date: 12/18/2019 CLINICAL DATA:  Thoracic laminectomy for abscess. EXAM: OPERATIVE THORACIC SPINE 5 VIEW(S) COMPARISON:  December 18, 2019 FINDINGS: Multiple intraoperative fluoroscopic images demonstrate instrumentation of the upper thoracic spine. An endotracheal tube is noted. The lateral views of the thoracic spine are suboptimal secondary to multiple overlapping structures and poor penetration. IMPRESSION: Thoracic instrumentation as above. Electronically Signed   By: Constance Holster M.D.   On: 12/18/2019 19:48   MR Shoulder Right W Wo Contrast  Result Date: 12/20/2019 CLINICAL DATA:  Right shoulder pain, clinical concern for septic joint EXAM: MRI OF THE RIGHT SHOULDER WITHOUT AND WITH CONTRAST TECHNIQUE: Multiplanar, multisequence MR imaging of the right shoulder was performed before and after the administration of intravenous contrast. CONTRAST:  31mL GADAVIST GADOBUTROL 1 MMOL/ML IV SOLN COMPARISON:  None. FINDINGS: Technical note: Motion degraded examination. Motion artifact reduction sequences were utilized. Best possible images were obtained and submitted for interpretation. Rotator cuff: Full thickness tear of the mid to posterior fibers of the distal supraspinatus tendon (series 6, image 13) with retraction to the level of the humeral head apex. The anterior most supraspinatus fibers are intact with tendinosis. There is articular sided fraying of the anterior infraspinatus tendon. Subscapularis and teres minor tendons appear intact. Muscles: Intramuscular edema within the supraspinatus tendon. No significant atrophy or fatty infiltration of the rotator cuff musculature. Biceps long head:  Intact with intra-articular tendinosis. Acromioclavicular Joint: Moderate arthropathy of  the acromioclavicular joint. Trace subacromial-subdeltoid bursal fluid. Glenohumeral Joint: No glenohumeral joint effusion. No pericapsular edema. There is diffuse cartilage thinning and surface  irregularity. Labrum:  Labrum is mildly degenerated. Bones: Reactive subchondral marrow edema at the acromioclavicular joint is favored degenerative. There are no acute fractures. No dislocation. No bone lesion or areas of marrow replacement. No periostitis. Other: There is a rounded intermediate T1/T2 signal intensity mass at the right lung apex measuring 3.0 x 2.0 cm (series 11, image 29; series 8, image 8) which appears pleural based. No appreciable enhancement within this mass on postcontrast images partially visualized soft tissue edema within the right supraclavicular region. No soft tissue fluid collections. IMPRESSION: 1. No glenohumeral joint effusion to suggest septic arthritis, as clinically questioned. 2. Pleural-based right apical soft tissue mass measuring 3 x 2 cm. Findings are concerning for malignancy. Further evaluation with PET-CT is suggested. 3. Full-thickness tear of the mid to posterior fibers of the distal supraspinatus tendon with retraction to the level of the humeral head apex. The anterior most supraspinatus fibers are intact with tendinosis. 4. Intramuscular edema within the supraspinatus tendon, is likely reactive secondary to tendon pathology. No significant atrophy or fatty infiltration of the rotator cuff musculature. 5. Subcutaneous and soft tissue edema most pronounced in the supraclavicular region without focal fluid collection 6. Moderate acromioclavicular and glenohumeral osteoarthritis. 7. Intra-articular biceps tendinosis. Electronically Signed   By: Davina Poke D.O.   On: 12/20/2019 09:30   AM DG Chest Port 1 View  Result Date: 12/20/2019 CLINICAL DATA:  Respiratory failure. EXAM: PORTABLE CHEST 1 VIEW COMPARISON:  December 18, 2019. FINDINGS: Stable cardiomediastinal silhouette. Minimal bibasilar subsegmental atelectasis is noted. Endotracheal nasogastric tubes have been removed. No pneumothorax or pleural effusion is noted. Bony thorax is unremarkable. IMPRESSION:  Minimal bibasilar subsegmental atelectasis. Endotracheal and nasogastric tube have been removed. Electronically Signed   By: Marijo Conception M.D.   On: 12/20/2019 08:22   DG Chest Port 1 View  Result Date: 12/18/2019 CLINICAL DATA:  Endotracheal tube placement EXAM: PORTABLE CHEST 1 VIEW COMPARISON:  12/18/2019 FINDINGS: The heart size is borderline enlarged. The endotracheal tube terminates approximately 4.2 cm above the carina. There are new bibasilar airspace opacities favored to represent postoperative atelectasis with pneumonia not entirely excluded. There is no pneumothorax. Subcutaneous gas is noted along the patient's upper thoracic cavity, likely related to instrumentation as seen on prior studies. There is a linear object projecting over the patient's low left neck. This may represent a central venous catheter. IMPRESSION: 1. Lines and tubes as above. 2. New bibasilar airspace opacities which may represent postoperative atelectasis with pneumonia not entirely excluded. Electronically Signed   By: Constance Holster M.D.   On: 12/18/2019 20:03   DG C-Arm 1-60 Min  Result Date: 12/18/2019 CLINICAL DATA:  Thoracic abscess EXAM: DG C-ARM 1-60 MIN FLUOROSCOPY TIME:  Fluoroscopy Time:  22 seconds Number of Acquired Spot Images: 5 COMPARISON:  MRI dated 12/18/2019 FINDINGS: There is instrumentation of the upper thoracic spine. The patient is intubated. Evaluation of the thoracic spine on the lateral views is nondiagnostic. IMPRESSION: Instrumentation of the thoracic spine as above. Electronically Signed   By: Constance Holster M.D.   On: 12/18/2019 20:17   ECHOCARDIOGRAM COMPLETE  Result Date: 12/19/2019   ECHOCARDIOGRAM REPORT   Patient Name:   Kent Flowers [facility] Date of Exam: 12/19/2019 Medical Rec #:  [id number]        Height:       68.0 in Accession #:  [id number]       Weight:       177.0 lb Date of Birth:  08/08/1946        BSA:          1.94 m Patient Age:    18 years         BP:            114/73 mmHg Patient Gender: M                HR:           110 bpm. Exam Location:  Inpatient Procedure: 2D Echo Indications:    Bacteremia 790.7  History:        Patient has no prior history of Echocardiogram examinations.                 Abscess. Sepsis.  Sonographer:    Johny Chess Referring Phys: [id number] ERIK C HOFFMAN IMPRESSIONS  1. Left ventricular ejection fraction, by visual estimation, is 50 to 55%. The left ventricle has low normal function. There is borderline left ventricular hypertrophy.  2. Mildly dilated left ventricular internal cavity size.  3. The left ventricle has no regional wall motion abnormalities.  4. Global right ventricle has normal systolic function.The right ventricular size is normal. No increase in right ventricular wall thickness.  5. Left atrial size was normal.  6. Right atrial size was normal.  7. The mitral valve is grossly normal. Trivial mitral valve regurgitation.  8. The tricuspid valve is grossly normal.  9. The aortic valve is tricuspid. Aortic valve regurgitation is not visualized. 10. The pulmonic valve was not well visualized. Pulmonic valve regurgitation is trivial. 11. Aortic dilatation noted. 12. There is mild dilatation of the ascending aorta. 13. TR signal is inadequate for assessing pulmonary artery systolic pressure. 14. The inferior vena cava is normal in size with <50% respiratory variability, suggesting right atrial pressure of 8 mmHg. 15. No obvious valvular vegetations. FINDINGS  Left Ventricle: Left ventricular ejection fraction, by visual estimation, is 50 to 55%. The left ventricle has low normal function. The left ventricle has no regional wall motion abnormalities. The left ventricular internal cavity size was mildly dilated left ventricle. There is borderline left ventricular hypertrophy. Left ventricular diastolic parameters were normal. Right Ventricle: The right ventricular size is normal. No increase in right ventricular wall thickness. Global RV  systolic function is has normal systolic function. Left Atrium: Left atrial size was normal in size. Right Atrium: Right atrial size was normal in size Pericardium: There is no evidence of pericardial effusion. Mitral Valve: The mitral valve is grossly normal. Trivial mitral valve regurgitation. Tricuspid Valve: The tricuspid valve is grossly normal. Tricuspid valve regurgitation is trivial. Aortic Valve: The aortic valve is tricuspid. Aortic valve regurgitation is not visualized. Moderate aortic valve annular calcification. Pulmonic Valve: The pulmonic valve was not well visualized. Pulmonic valve regurgitation is trivial. Pulmonic regurgitation is trivial. Aorta: Aortic dilatation noted. There is mild dilatation of the ascending aorta. Venous: The inferior vena cava is normal in size with less than 50% respiratory variability, suggesting right atrial pressure of 8 mmHg. IAS/Shunts: No atrial level shunt detected by color flow Doppler.  LEFT VENTRICLE PLAX 2D LVIDd:         6.00 cm  Diastology LVIDs:         3.90 cm  LV e' lateral: 14.10 cm/s LV PW:         1.20 cm  LV e' medial:  9.57 cm/s LV IVS:        0.90 cm LVOT diam:     2.30 cm LV SV:         114 ml LV SV Index:   57.68 LVOT Area:     4.15 cm  RIGHT VENTRICLE RV S prime:     21.10 cm/s TAPSE (M-mode): 2.7 cm LEFT ATRIUM             Index       RIGHT ATRIUM           Index LA diam:        4.40 cm 2.27 cm/m  RA Area:     13.10 cm LA Vol (A2C):   72.6 ml 37.41 ml/m RA Volume:   30.20 ml  15.56 ml/m LA Vol (A4C):   45.2 ml 23.29 ml/m LA Biplane Vol: 60.2 ml 31.02 ml/m  AORTIC VALVE LVOT Vmax:   157.00 cm/s LVOT Vmean:  107.000 cm/s LVOT VTI:    0.242 m  AORTA Ao Root diam: 3.90 cm Ao Asc diam:  4.20 cm  SHUNTS Systemic VTI:  0.24 m Systemic Diam: 2.30 cm  Rozann Lesches MD Electronically signed by Rozann Lesches MD Signature Date/Time: 12/19/2019/6:36:19 PM    Final     Assessment/Plan: Overall seems to be doing fairly well.  Will transfer to floor  today.  Continue therapy.  Continue IV antibiotics.  Report of MRI of shoulder reviewed.  Will get CT scan of the chest.  This in the hand could be related to this, specially the soft tissue edema changes in the supraclavicular region, the lower plexus could be involved.  Estimated body mass index is 26.92 kg/m as calculated from the following:   Height as of this encounter: 5\' 8"  (1.727 m).   Weight as of this encounter: 80.3 kg.    LOS: 2 days    Eustace Moore 12/20/2019, 9:59 AM

## 2019-12-20 NOTE — Progress Notes (Signed)
This RN discussed with patient the potential consequences of attempting to mix PTA opioid medications with those he is receiving during admission. Patient states "well my daughter said no, so that's the end of that".  Candy Sledge, RN

## 2019-12-20 NOTE — Progress Notes (Signed)
OT Cancellation Note  Patient Details Name: RHYDIAN ZEPP MRN: [id number] DOB: Oct 05, 1946   Cancelled Treatment:    Reason Eval/Treat Not Completed: Patient declined, no reason specified(Pt stating "my pain is too severe right now.")  OT to continue to follow as schedule permits for next available treatment day for OT eval.  Jefferey Pica OTR/L Acute Rehabilitation Services Pager: [phone] Office: [place] [facility] 12/20/2019, 10:45 AM

## 2019-12-20 NOTE — Progress Notes (Signed)
eLink Physician-Brief Progress Note Patient Name: DEONE MATHER DOB: June 17, 1946 MRN: [id number]   Date of Service  12/20/2019  HPI/Events of Note  Patient requests Melatonin for sleep.   eICU Interventions  Will have pharmacy enter order for Melatonin 3 mg PO Q HS PRN sleep.     Intervention Category Major Interventions: Other:  Lysle Dingwall 12/20/2019, 12:09 AM

## 2019-12-20 NOTE — Progress Notes (Signed)
PT Cancellation Note  Patient Details Name: Kent Flowers MRN: [id number] DOB: May 13, 1946   Cancelled Treatment:    Reason Eval/Treat Not Completed: Pain limiting ability to participate(pt states pain too severe after bathing and bed mobility and is unable to participate at this time despite premedication 8/10. pt aware of importance of therapy by adamantly refusing at this time. will follow)   Tishanna Dunford B Levern Kalka 12/20/2019, 10:35 AM  Bayard Males, PT Acute Rehabilitation Services Pager: [phone] Office: [phone]

## 2019-12-20 NOTE — Progress Notes (Signed)
[facility]   Reason for visit: Follow up on epidural abscess  Interval History: culture with MSSA, on cefazolin day 1, total antibiotics day 4 No diarrhea, no rash.  Constipation.  MRI reviewed and mass noted on the chest and Dr. Ronnald Ramp has ordered CT CA protocol.   Complaint of pain   Physical Exam: Constitutional:  Vitals:   12/20/19 0900 12/20/19 1000  BP: (!) 167/90 (!) 156/72  Pulse: 91 (!) 109  Resp: 18 (!) 23  Temp:    SpO2: 95% 100%   patient appears in NAD Eyes: anicteric HENT: no thrush Respiratory: Normal respiratory effort; CTA B Cardiovascular: RRR GI: soft, nt, nd  Review of Systems: Constitutional: negative for fevers and chills Gastrointestinal: positive for constipation Integument/breast: negative for rash  Lab Results  Component Value Date   WBC 20.7 (H) 12/19/2019   HGB 13.5 12/19/2019   HCT 40.4 12/19/2019   MCV 96.0 12/19/2019   PLT 195 12/19/2019    Lab Results  Component Value Date   CREATININE 0.66 12/20/2019   BUN 16 12/20/2019   NA 136 12/20/2019   K 3.3 (L) 12/20/2019   CL 99 12/20/2019   CO2 26 12/20/2019    Lab Results  Component Value Date   ALT 27 12/18/2019   AST 38 12/18/2019   ALKPHOS 69 12/18/2019     Microbiology: Recent Results (from the past 240 hour(s))  Urine culture     Status: Abnormal   Collection Time: 12/18/19 12:34 AM   Specimen: In/Out Cath Urine  Result Value Ref Range Status   Specimen Description IN/OUT CATH URINE  Final   Special Requests   Final    NONE Performed at [facility], [address] [place], [place] [address]    Culture >=100,000 COLONIES/mL STAPHYLOCOCCUS AUREUS (A)  Final   Report Status 12/19/2019 FINAL  Final   Organism ID, Bacteria STAPHYLOCOCCUS AUREUS (A)  Final      Susceptibility   Staphylococcus aureus - MIC*    CIPROFLOXACIN <=0.5 SENSITIVE Sensitive     GENTAMICIN <=0.5 SENSITIVE Sensitive     NITROFURANTOIN 32 SENSITIVE Sensitive      OXACILLIN <=0.25 SENSITIVE Sensitive     TETRACYCLINE <=1 SENSITIVE Sensitive     VANCOMYCIN <=0.5 SENSITIVE Sensitive     TRIMETH/SULFA <=10 SENSITIVE Sensitive     CLINDAMYCIN <=0.25 SENSITIVE Sensitive     RIFAMPIN <=0.5 SENSITIVE Sensitive     Inducible Clindamycin NEGATIVE Sensitive     * >=100,000 COLONIES/mL STAPHYLOCOCCUS AUREUS  Blood Culture (routine x 2)     Status: Abnormal   Collection Time: 12/18/19 12:37 AM   Specimen: BLOOD  Result Value Ref Range Status   Specimen Description BLOOD RIGHT ARM  Final   Special Requests   Final    BOTTLES DRAWN AEROBIC AND ANAEROBIC Blood Culture adequate volume   Culture  Setup Time   Final    GRAM POSITIVE COCCI IN CLUSTERS IN BOTH AEROBIC AND ANAEROBIC BOTTLES CRITICAL RESULT CALLED TO, READ BACK BY AND VERIFIED WITH: Leonie Green PHARMD, AT 1302 12/18/19 BY Rush Landmark Performed at [facility], [place] [address] [place], [place] [address]    Culture STAPHYLOCOCCUS AUREUS (A)  Final   Report Status 12/20/2019 FINAL  Final   Organism ID, Bacteria STAPHYLOCOCCUS AUREUS  Final      Susceptibility   Staphylococcus aureus - MIC*    CIPROFLOXACIN <=0.5 SENSITIVE Sensitive     ERYTHROMYCIN 4  INTERMEDIATE Intermediate     GENTAMICIN <=0.5 SENSITIVE Sensitive     OXACILLIN <=0.25 SENSITIVE Sensitive     TETRACYCLINE <=1 SENSITIVE Sensitive     VANCOMYCIN 1 SENSITIVE Sensitive     TRIMETH/SULFA <=10 SENSITIVE Sensitive     CLINDAMYCIN <=0.25 SENSITIVE Sensitive     RIFAMPIN <=0.5 SENSITIVE Sensitive     Inducible Clindamycin NEGATIVE Sensitive     * STAPHYLOCOCCUS AUREUS  Culture, respiratory (non-expectorated)     Status: None (Preliminary result)   Collection Time: 12/18/19  7:44 AM   Specimen: Tracheal Aspirate; Respiratory  Result Value Ref Range Status   Specimen Description TRACHEAL ASPIRATE  Final   Special Requests NONE  Final   Gram Stain   Final    MODERATE WBC PRESENT, PREDOMINANTLY PMN MODERATE GRAM POSITIVE COCCI  IN CLUSTERS RARE GRAM NEGATIVE RODS    Culture   Final    CULTURE REINCUBATED FOR BETTER GROWTH Performed at [facility], [place] [address], [place], [place] [address]    Report Status PENDING  Incomplete  Respiratory Panel by RT PCR (Flu A&B, Covid) - Nasopharyngeal Swab     Status: None   Collection Time: 12/18/19 10:28 AM   Specimen: Nasopharyngeal Swab  Result Value Ref Range Status   SARS Coronavirus 2 by RT PCR NEGATIVE NEGATIVE Final    Comment: (NOTE) SARS-CoV-2 target nucleic acids are NOT DETECTED. The SARS-CoV-2 RNA is generally detectable in upper respiratoy specimens during the acute phase of infection. The lowest concentration of SARS-CoV-2 viral copies this assay can detect is 131 copies/mL. A negative result does not preclude SARS-Cov-2 infection and should not be used as the sole basis for treatment or other patient management decisions. A negative result may occur with  improper specimen collection/handling, submission of specimen other than nasopharyngeal swab, presence of viral mutation(s) within the areas targeted by this assay, and inadequate number of viral copies (<131 copies/mL). A negative result must be combined with clinical observations, patient history, and epidemiological information. The expected result is Negative. Fact Sheet for Patients:  PinkCheek.be Fact Sheet for Healthcare Providers:  GravelBags.it This test is not yet ap proved or cleared by the [place] FDA and  has been authorized for detection and/or diagnosis of SARS-CoV-2 by FDA under an Emergency Use Authorization (EUA). This EUA will remain  in effect (meaning this test can be used) for the duration of the COVID-19 declaration under Section 564(b)(1) of the Act, 21 U.S.C. section 360bbb-3(b)(1), unless the authorization is terminated or revoked sooner.    Influenza A by PCR NEGATIVE NEGATIVE Final   Influenza B by  PCR NEGATIVE NEGATIVE Final    Comment: (NOTE) The Xpert Xpress SARS-CoV-2/FLU/RSV assay is intended as an aid in  the diagnosis of influenza from Nasopharyngeal swab specimens and  should not be used as a sole basis for treatment. Nasal washings and  aspirates are unacceptable for Xpert Xpress SARS-CoV-2/FLU/RSV  testing. Fact Sheet for Patients: PinkCheek.be Fact Sheet for Healthcare Providers: GravelBags.it This test is not yet approved or cleared by the [place] FDA and  has been authorized for detection and/or diagnosis of SARS-CoV-2 by  FDA under an Emergency Use Authorization (EUA). This EUA will remain  in effect (meaning this test can be used) for the duration of the  Covid-19 declaration under Section 564(b)(1) of the Act, 21  U.S.C. section 360bbb-3(b)(1), unless the authorization is  terminated or revoked. Performed at [facility], [place] [address], [place], [place]  [address]   Aerobic/Anaerobic Culture (surgical/deep wound)     Status: None (Preliminary result)   Collection Time: 12/18/19  5:02 PM   Specimen: Abscess  Result Value Ref Range Status   Specimen Description ABSCESS THORACIC  Final   Special Requests NONE  Final   Gram Stain   Final    RARE WBC PRESENT, PREDOMINANTLY PMN NO ORGANISMS SEEN Performed at [facility], [place] [address] [place], [place] [address]    Culture   Final    FEW STAPHYLOCOCCUS AUREUS NO ANAEROBES ISOLATED; CULTURE IN PROGRESS FOR 5 DAYS    Report Status PENDING  Incomplete   Organism ID, Bacteria STAPHYLOCOCCUS AUREUS  Final      Susceptibility   Staphylococcus aureus - MIC*    CIPROFLOXACIN <=0.5 SENSITIVE Sensitive     ERYTHROMYCIN >=8 RESISTANT Resistant     GENTAMICIN <=0.5 SENSITIVE Sensitive     OXACILLIN 0.5 SENSITIVE Sensitive     TETRACYCLINE <=1 SENSITIVE Sensitive     VANCOMYCIN 1 SENSITIVE Sensitive     TRIMETH/SULFA <=10 SENSITIVE Sensitive      CLINDAMYCIN <=0.25 SENSITIVE Sensitive     RIFAMPIN <=0.5 SENSITIVE Sensitive     Inducible Clindamycin NEGATIVE Sensitive     * FEW STAPHYLOCOCCUS AUREUS  MRSA PCR Screening     Status: None   Collection Time: 12/18/19  6:12 PM   Specimen: Nasal Mucosa; Nasopharyngeal  Result Value Ref Range Status   MRSA by PCR NEGATIVE NEGATIVE Final    Comment:        The GeneXpert MRSA Assay (FDA approved for NASAL specimens only), is one component of a comprehensive MRSA colonization surveillance program. It is not intended to diagnose MRSA infection nor to guide or monitor treatment for MRSA infections. Performed at [facility], [place] [address], [place], [place] [address]   Culture, blood (routine x 2)     Status: None (Preliminary result)   Collection Time: 12/19/19  6:59 AM   Specimen: BLOOD  Result Value Ref Range Status   Specimen Description BLOOD RIGHT ANTECUBITAL  Final   Special Requests   Final    BOTTLES DRAWN AEROBIC AND ANAEROBIC Blood Culture adequate volume   Culture   Final    NO GROWTH < 24 HOURS Performed at [facility], [place] [address] [place], [place] [address]    Report Status PENDING  Incomplete    Impression/Plan:  1. 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  Will need a prolonged course with IV cefazolin for 6-8 weeks.    2.  Right apical mass - noted on MR of his shoulder and reviewed by Dr. Vaughan Browner and was present on CT.  More c/w a mass rather than infection.  CT ordered by neurosurgery.  High risk with smoking history.    3.  Bacteremia - MSSA from #1.  TTE without any endocarditis noted.  Repeat blood culture set with ngtd from 1/1.

## 2019-12-20 NOTE — Progress Notes (Signed)
Patient refusing to go to chest CT, states "he is too tired". Patient informed that he is not expected to do anything expect lay on table for image, patient states "I don't care, its not happening today". On call [place] notified, will continue to monitor.  Candy Sledge, RN

## 2019-12-20 NOTE — Progress Notes (Signed)
Spoke with patient's daughter Watt Climes, she expressed concern that her father is asking her to bring additional pain medication to him while he is admitted. Explained to her how dangerous it would be if she were to provide additional opioid medications on top of what patient is receiving in the hospital. She expressed that she was not going to do as patient asked her, but was concerned over patient's 'uncontrolled pain'. CCM MD notified of this by patient. Will continue to monitor.  Candy Sledge, RN

## 2019-12-21 ENCOUNTER — Inpatient Hospital Stay (HOSPITAL_COMMUNITY): Payer: Medicare Other

## 2019-12-21 DIAGNOSIS — M545 Low back pain: Secondary | ICD-10-CM

## 2019-12-21 DIAGNOSIS — J449 Chronic obstructive pulmonary disease, unspecified: Secondary | ICD-10-CM

## 2019-12-21 DIAGNOSIS — R509 Fever, unspecified: Secondary | ICD-10-CM

## 2019-12-21 DIAGNOSIS — Z79899 Other long term (current) drug therapy: Secondary | ICD-10-CM

## 2019-12-21 DIAGNOSIS — G062 Extradural and subdural abscess, unspecified: Secondary | ICD-10-CM

## 2019-12-21 DIAGNOSIS — N486 Induration penis plastica: Secondary | ICD-10-CM

## 2019-12-21 DIAGNOSIS — R531 Weakness: Secondary | ICD-10-CM

## 2019-12-21 DIAGNOSIS — Z7289 Other problems related to lifestyle: Secondary | ICD-10-CM

## 2019-12-21 DIAGNOSIS — N400 Enlarged prostate without lower urinary tract symptoms: Secondary | ICD-10-CM

## 2019-12-21 DIAGNOSIS — Z66 Do not resuscitate: Secondary | ICD-10-CM

## 2019-12-21 DIAGNOSIS — Z859 Personal history of malignant neoplasm, unspecified: Secondary | ICD-10-CM

## 2019-12-21 DIAGNOSIS — B9562 Methicillin resistant Staphylococcus aureus infection as the cause of diseases classified elsewhere: Secondary | ICD-10-CM

## 2019-12-21 LAB — MAGNESIUM: Magnesium: 2.2 mg/dL (ref 1.7–2.4)

## 2019-12-21 LAB — BASIC METABOLIC PANEL
Anion gap: 13 (ref 5–15)
BUN: 13 mg/dL (ref 8–23)
CO2: 28 mmol/L (ref 22–32)
Calcium: 8.2 mg/dL — ABNORMAL LOW (ref 8.9–10.3)
Chloride: 98 mmol/L (ref 98–111)
Creatinine, Ser: 0.79 mg/dL (ref 0.61–1.24)
GFR calc Af Amer: >60 mL/min (ref >60)
GFR calc non Af Amer: >60 mL/min (ref >60)
Glucose, Bld: 133 mg/dL — ABNORMAL HIGH (ref 70–99)
Potassium: 3.2 mmol/L — ABNORMAL LOW (ref 3.5–5.1)
Sodium: 139 mmol/L (ref 135–145)

## 2019-12-21 LAB — CBC
HCT: 42.8 % (ref 39.0–52.0)
Hemoglobin: 14.5 g/dL (ref 13.0–17.0)
MCH: 31.7 pg (ref 26.0–34.0)
MCHC: 33.9 g/dL (ref 30.0–36.0)
MCV: 93.4 fL (ref 80.0–100.0)
Platelets: 355 10*3/uL (ref 150–400)
RBC: 4.58 MIL/uL (ref 4.22–5.81)
RDW: 13.3 % (ref 11.5–15.5)
WBC: 24 10*3/uL — ABNORMAL HIGH (ref 4.0–10.5)
nRBC: 0 % (ref 0.0–0.2)

## 2019-12-21 LAB — GLUCOSE, CAPILLARY
Glucose-Capillary: 168 mg/dL — ABNORMAL HIGH (ref 70–99)
Glucose-Capillary: 149 mg/dL — ABNORMAL HIGH (ref 70–99)
Glucose-Capillary: 146 mg/dL — ABNORMAL HIGH (ref 70–99)
Glucose-Capillary: 200 mg/dL — ABNORMAL HIGH (ref 70–99)

## 2019-12-21 LAB — PHOSPHORUS: Phosphorus: 3.1 mg/dL (ref 2.5–4.6)

## 2019-12-21 IMAGING — CT CT CHEST W/O CM
2 of 3 series · 13 of 36 positions shown, 16 images · non-contrast
Comparison: [DATE].
COMPARISON: [DATE].

Addendum:
CLINICAL DATA: Lung nodule.

EXAM:
CT CHEST WITHOUT CONTRAST
TECHNIQUE: Multidetector CT imaging of the chest was performed following the
standard protocol without IV contrast.

[Series 3: chest w/o 2mm st · axial · non-contrast · 0.83mm/px · z∈[-278,-8]mm · 10 of 159 slices shown, 13 images]
[im 12/159  mediastinal]
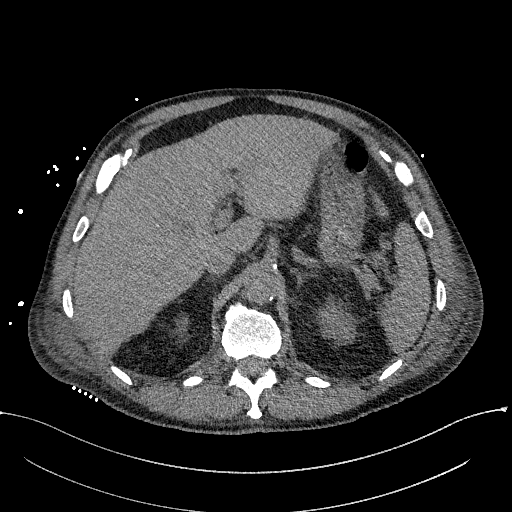
[im 12/159  lung]
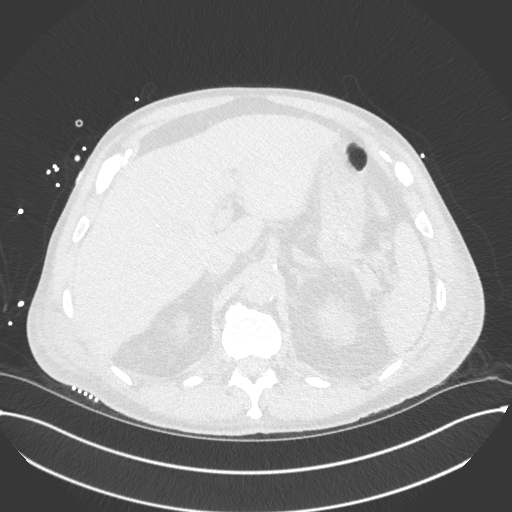
[im 24/159  lung]
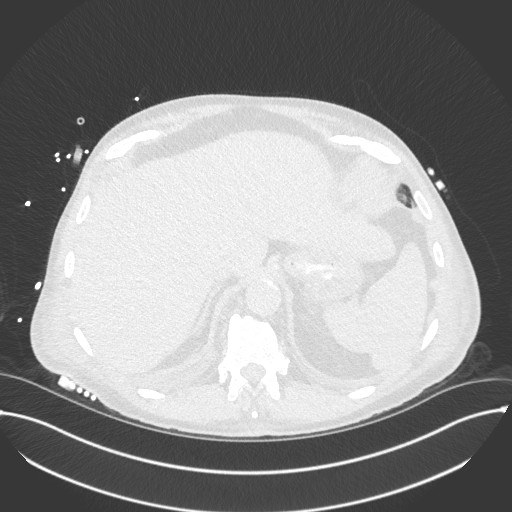
[im 41/159  lung]
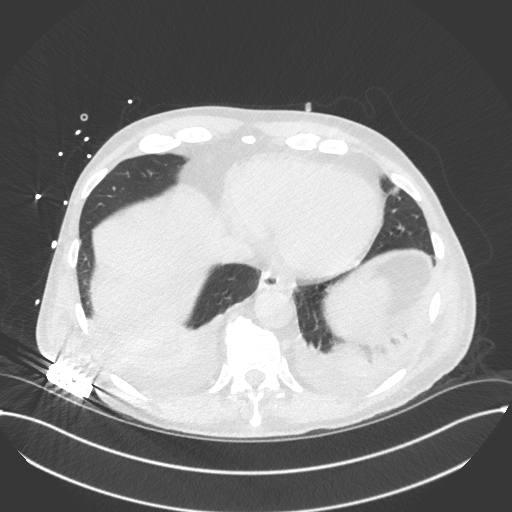
[im 59/159  lung]
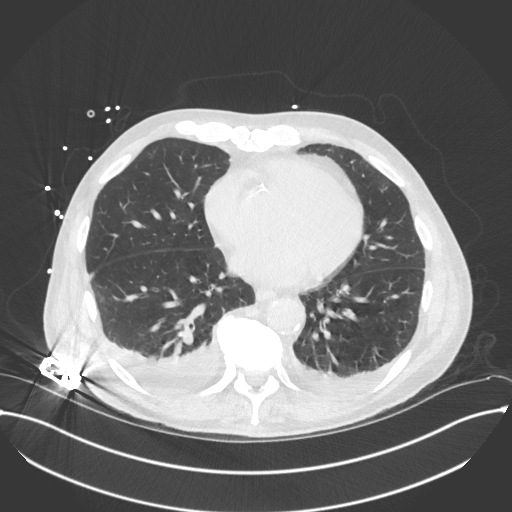
[im 71/159  mediastinal]
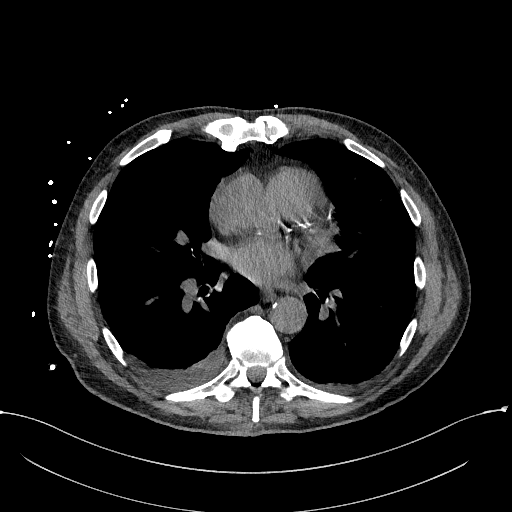
[im 71/159  lung]
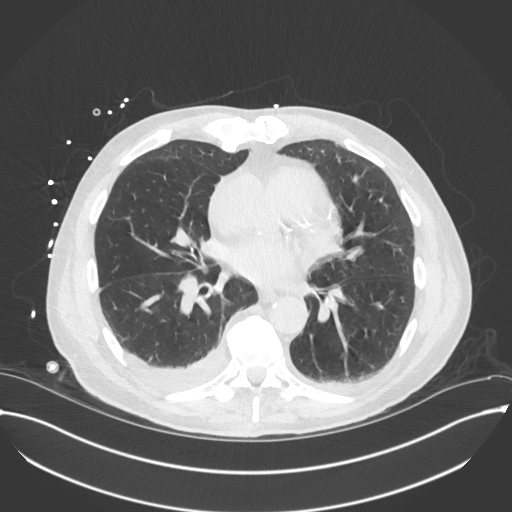
[im 88/159  lung]
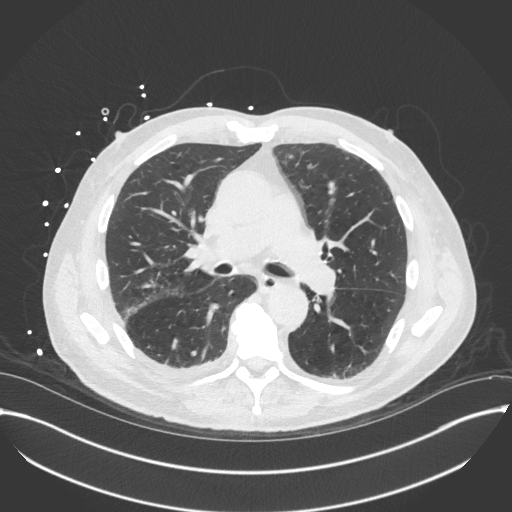
[im 100/159  lung]
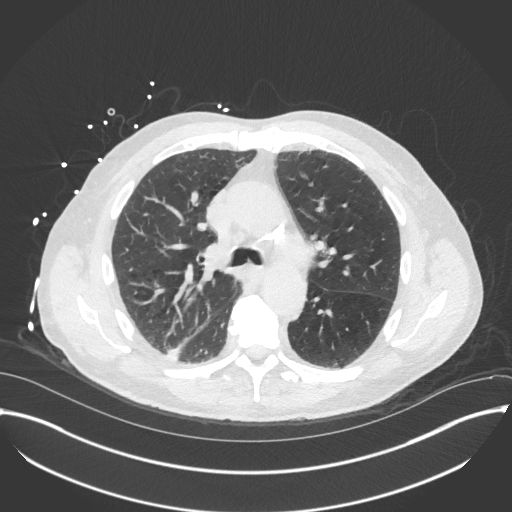
[im 118/159  lung]
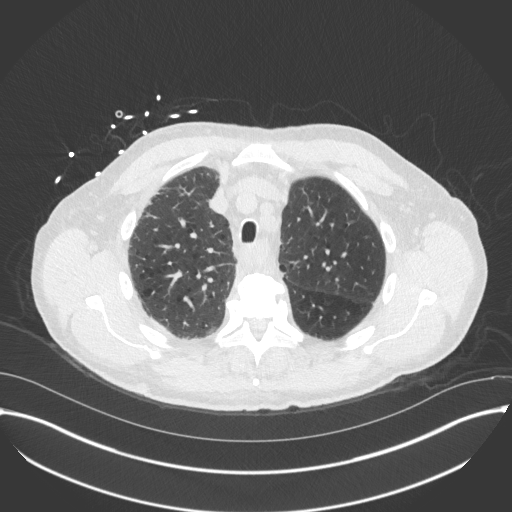
[im 135/159  mediastinal]
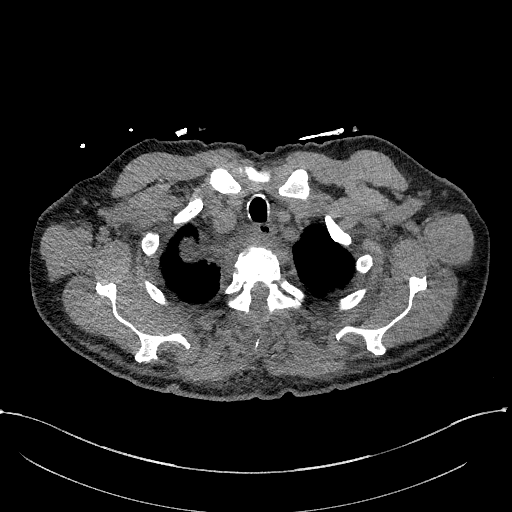
[im 135/159  lung]
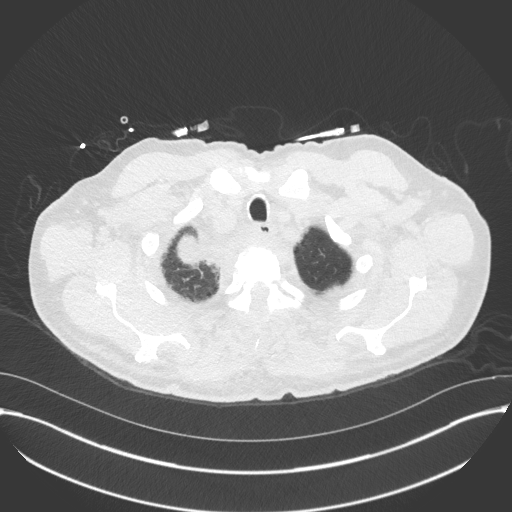
[im 147/159  lung]
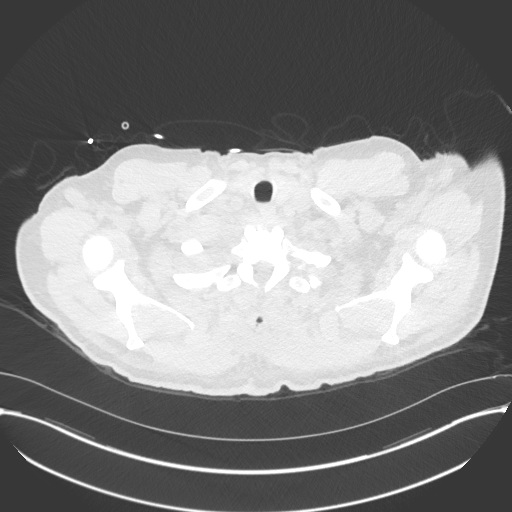

[Series 5: chest w/o 3mm st cor · coronal · non-contrast · 0.63mm/px · 3 of 101 slices shown]
[im 21/101  lung]
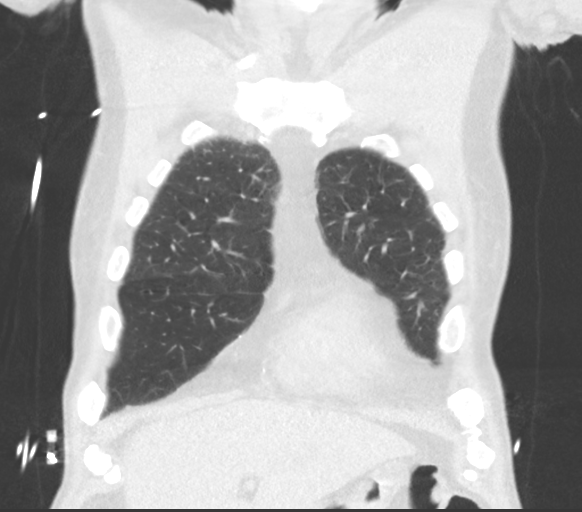
[im 41/101  lung]
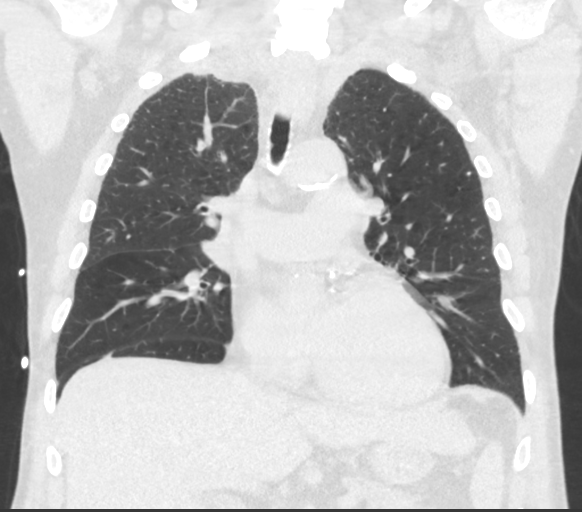
[im 61/101  lung]
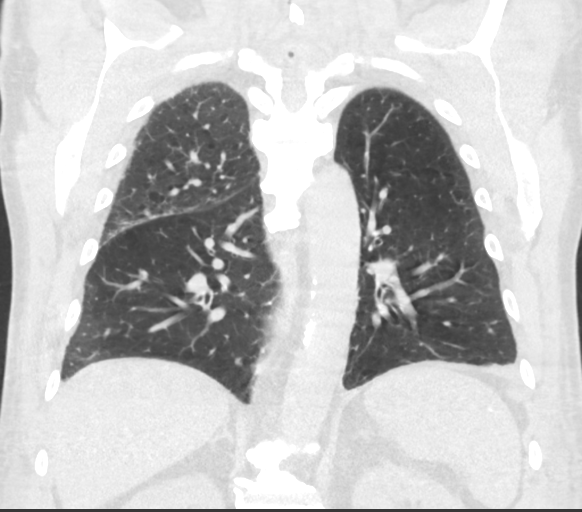

[13 of 36 positions shown; findings below may reference images not displayed]

FINDINGS: Cardiovascular: 4.4 cm ascending thoracic aortic aneurysm is noted.
Atherosclerosis of thoracic aorta is noted. Normal cardiac size. No
pericardial effusion is noted. Coronary artery calcifications are
noted.

Mediastinum/Nodes: Thyroid gland is unremarkable. No significant
adenopathy is noted. Severe wall thickening of proximal esophagus is
noted concerning for inflammation or esophagitis.

Lungs/Pleura: No pneumothorax is noted. Small bilateral pleural
effusions are noted with adjacent subsegmental atelectasis. Minimal
emphysematous disease is noted in the upper lobes bilaterally.

Upper Abdomen: No acute abnormality.

Musculoskeletal: There is interval development of severe lytic
destruction involving the posterior elements of T2 and T3 vertebra
most consistent with severe osteomyelitis. There is noted a 4.4 x
3.3 cm collection of gas and fluid in this area concerning for
abscess.
IMPRESSION: Interval development of severe lytic destruction involving the
posterior elements of T2 and T3 vertebra most consistent with severe
osteomyelitis. 4.4 x 3.3 cm collection of gas and fluid is seen in
this area concerning for developing abscess. These results will be
called to the ordering clinician or representative by the
Radiologist Assistant, and communication documented in the PACS or
zVision Dashboard.

4.4 cm ascending thoracic aortic aneurysm is noted. Recommend annual
imaging followup by CTA or MRA. This recommendation follows [4J]
ACCF/AHA/AATS/ACR/ASA/SCA/RIZKALLA/RIZKALLA/RIZKALLA/RIZKALLA Guidelines for the
Diagnosis and Management of Patients with Thoracic Aortic Disease.
Circulation. [4J]; 121: [id number]. Aortic aneurysm NOS
([4J]-[4J]).

Aortic Atherosclerosis ([4J]-[4J]) and Emphysema ([4J]-[4J]).

ADDENDUM:
Reportedly the patient has undergone surgery involving the upper
thoracic spine for treatment of epidural abscess. Therefore, the
defect involving posterior elements of T2 and T3 is consistent with
postoperative change as opposed to lytic destruction or
osteomyelitis. The gas noted in the soft tissues is also
postoperative in etiology. This was discussed with Dr. RIZKALLA.

*** End of Addendum ***
FINDINGS: Cardiovascular: 4.4 cm ascending thoracic aortic aneurysm is noted.
Atherosclerosis of thoracic aorta is noted. Normal cardiac size. No
pericardial effusion is noted. Coronary artery calcifications are
noted.

Mediastinum/Nodes: Thyroid gland is unremarkable. No significant
adenopathy is noted. Severe wall thickening of proximal esophagus is
noted concerning for inflammation or esophagitis.

Lungs/Pleura: No pneumothorax is noted. Small bilateral pleural
effusions are noted with adjacent subsegmental atelectasis. Minimal
emphysematous disease is noted in the upper lobes bilaterally.

Upper Abdomen: No acute abnormality.

Musculoskeletal: There is interval development of severe lytic
destruction involving the posterior elements of T2 and T3 vertebra
most consistent with severe osteomyelitis. There is noted a 4.4 x
3.3 cm collection of gas and fluid in this area concerning for
abscess.
IMPRESSION: Interval development of severe lytic destruction involving the
posterior elements of T2 and T3 vertebra most consistent with severe
osteomyelitis. 4.4 x 3.3 cm collection of gas and fluid is seen in
this area concerning for developing abscess. These results will be
called to the ordering clinician or representative by the
Radiologist Assistant, and communication documented in the PACS or
zVision Dashboard.

4.4 cm ascending thoracic aortic aneurysm is noted. Recommend annual
imaging followup by CTA or MRA. This recommendation follows [4J]
ACCF/AHA/AATS/ACR/ASA/SCA/RIZKALLA/RIZKALLA/RIZKALLA/RIZKALLA Guidelines for the
Diagnosis and Management of Patients with Thoracic Aortic Disease.
Circulation. [4J]; 121: [id number]. Aortic aneurysm NOS
([4J]-[4J]).

Aortic Atherosclerosis ([4J]-[4J]) and Emphysema ([4J]-[4J]).

## 2019-12-21 IMAGING — DX DG CHEST 1V PORT
1 series · 1 of 1 positions shown · non-contrast
Comparison: [DATE]

CLINICAL DATA: Acute respiratory failure

EXAM:
PORTABLE CHEST 1 VIEW

[chest ap]
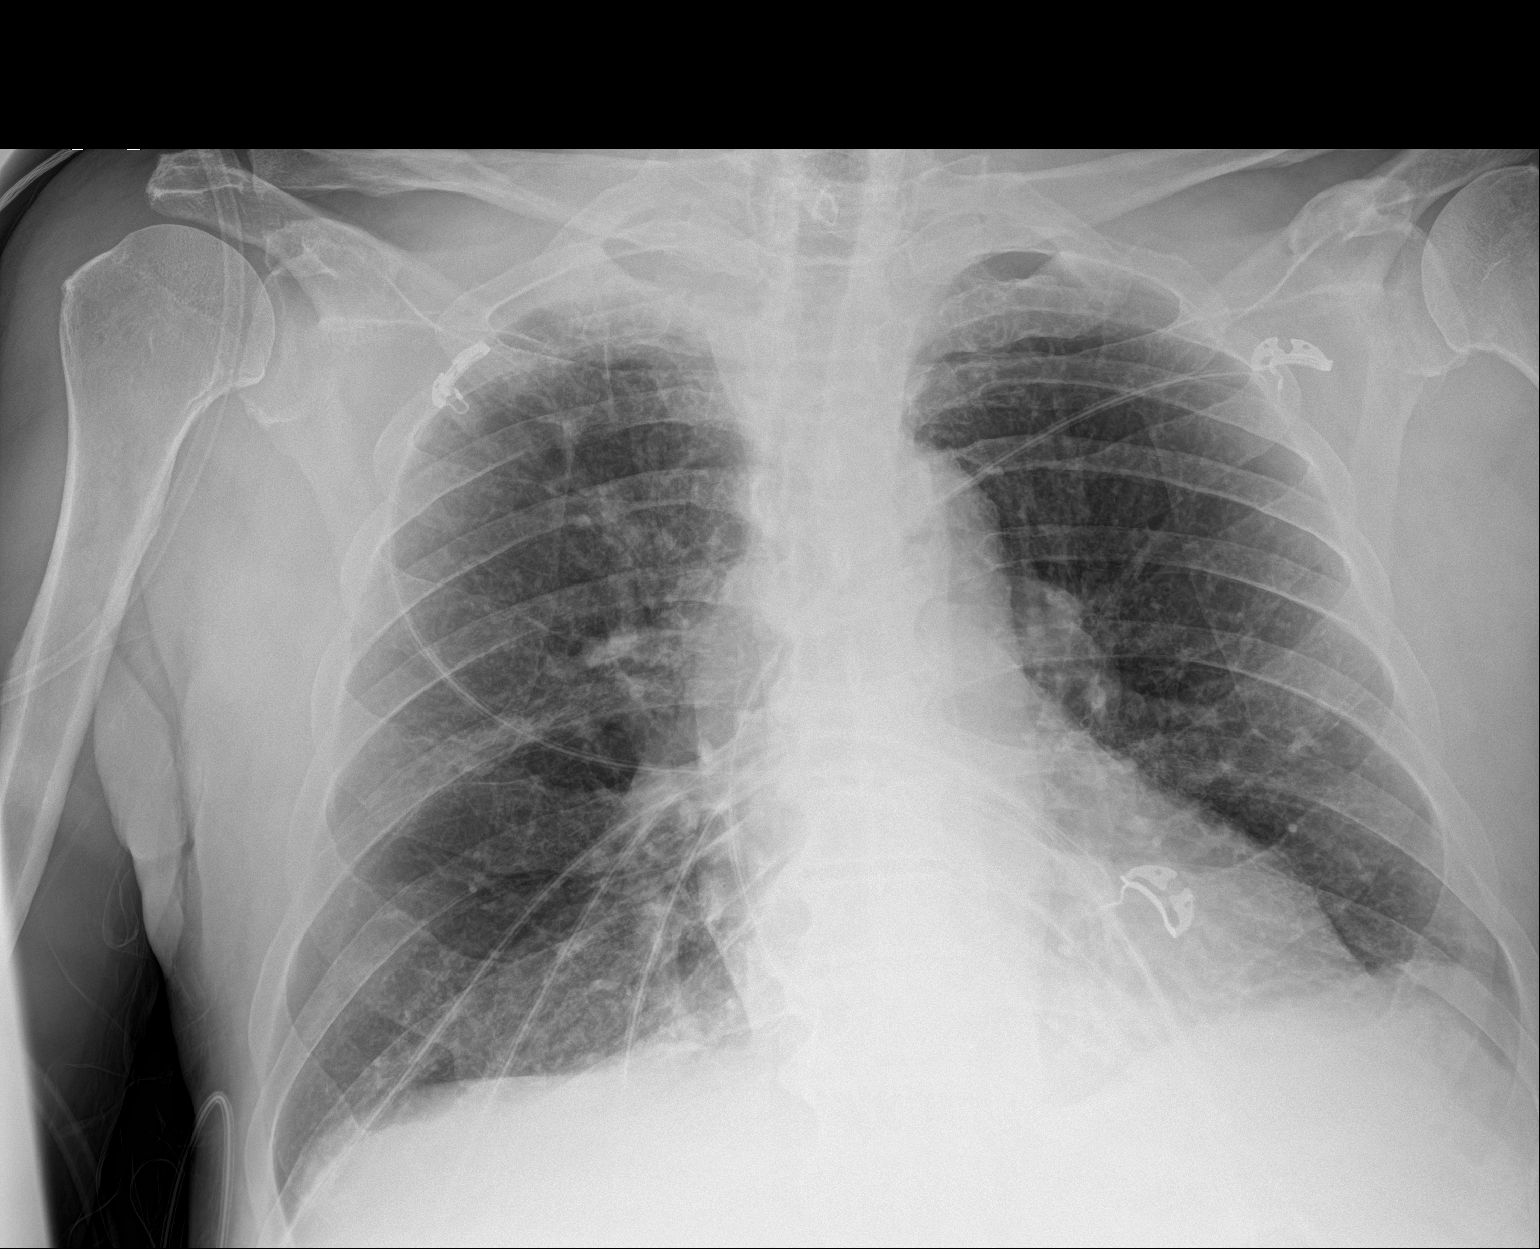

[1 of 1 positions shown; findings below may reference images not displayed]

FINDINGS: Stable heart size and vascularity. Similar mild vascular congestion
and basilar atelectasis. No new collapse or consolidation. No
enlarging effusion or pneumothorax. Trachea midline. Degenerative
changes of the spine.
IMPRESSION: Stable chest exam over 24 hours with mild vascular congestion and
persistent basilar atelectasis.

## 2019-12-21 MED ORDER — AMLODIPINE BESYLATE 5 MG PO TABS: 5.0000 mg | ORAL_TABLET | Freq: Every day | ORAL | Status: DC

## 2019-12-21 MED ORDER — SENNA 8.6 MG PO TABS
1.0000 | ORAL_TABLET | Freq: Every day | ORAL | Status: DC
  Administered 2019-12-22 – 2019-12-24 (×3): 8.6 mg via ORAL
  Filled 2019-12-21 (×4): qty 1

## 2019-12-21 MED ORDER — POTASSIUM CHLORIDE CRYS ER 20 MEQ PO TBCR: 40.0000 meq | EXTENDED_RELEASE_TABLET | Freq: Once | ORAL | Status: AC

## 2019-12-21 MED ORDER — AMLODIPINE BESYLATE 10 MG PO TABS
10.0000 mg | ORAL_TABLET | Freq: Every day | ORAL | Status: DC
  Administered 2019-12-21 – 2019-12-25 (×5): 10 mg via ORAL
  Filled 2019-12-21 (×5): qty 1

## 2019-12-21 MED ORDER — POTASSIUM CHLORIDE CRYS ER 20 MEQ PO TBCR
40.0000 meq | EXTENDED_RELEASE_TABLET | Freq: Once | ORAL | Status: AC
  Administered 2019-12-21: 09:00:00 40 meq via ORAL
  Filled 2019-12-21: qty 2

## 2019-12-21 MED ORDER — LISINOPRIL 10 MG PO TABS: 10.0000 mg | ORAL_TABLET | Freq: Every day | ORAL | Status: DC

## 2019-12-21 MED ORDER — POLYETHYLENE GLYCOL 3350 17 G PO PACK
17.0000 g | PACK | Freq: Two times a day (BID) | ORAL | Status: DC
  Administered 2019-12-21: 22:00:00 17 g via ORAL
  Filled 2019-12-21: qty 1

## 2019-12-21 NOTE — Progress Notes (Signed)
[facility] for Infectious Disease   Reason for visit: Follow up on epidural abscess  Interval History: no acute events, WBC stable, afebrile.  No associated rash or diarrhea.    Physical Exam: Constitutional:  Vitals:   12/21/19 1100 12/21/19 1146  BP: (!) 169/83 133/74  Pulse: 100 99  Resp: 15 18  Temp:  98.5 F (36.9 C)  SpO2: (!) 89% 92%   patient appears in NAD Eyes: anicteric HENT: no thrush   Review of Systems: Constitutional: negative for fevers and chills Gastrointestinal: positive for constipation Integument/breast: negative for rash  Lab Results  Component Value Date   WBC 24.0 (H) 12/21/2019   HGB 14.5 12/21/2019   HCT 42.8 12/21/2019   MCV 93.4 12/21/2019   PLT 355 12/21/2019    Lab Results  Component Value Date   CREATININE 0.79 12/21/2019   BUN 13 12/21/2019   NA 139 12/21/2019   K 3.2 (L) 12/21/2019   CL 98 12/21/2019   CO2 28 12/21/2019    Lab Results  Component Value Date   ALT 27 12/18/2019   AST 38 12/18/2019   ALKPHOS 69 12/18/2019     Microbiology: Recent Results (from the past 240 hour(s))  Urine culture     Status: Abnormal   Collection Time: 12/18/19 12:34 AM   Specimen: In/Out Cath Urine  Result Value Ref Range Status   Specimen Description IN/OUT CATH URINE  Final   Special Requests   Final    NONE Performed at [facility], [address] [place], [place] [address]    Culture >=100,000 COLONIES/mL STAPHYLOCOCCUS AUREUS (A)  Final   Report Status 12/19/2019 FINAL  Final   Organism ID, Bacteria STAPHYLOCOCCUS AUREUS (A)  Final      Susceptibility   Staphylococcus aureus - MIC*    CIPROFLOXACIN <=0.5 SENSITIVE Sensitive     GENTAMICIN <=0.5 SENSITIVE Sensitive     NITROFURANTOIN 32 SENSITIVE Sensitive     OXACILLIN <=0.25 SENSITIVE Sensitive     TETRACYCLINE <=1 SENSITIVE Sensitive     VANCOMYCIN <=0.5 SENSITIVE Sensitive     TRIMETH/SULFA <=10 SENSITIVE Sensitive     CLINDAMYCIN <=0.25 SENSITIVE  Sensitive     RIFAMPIN <=0.5 SENSITIVE Sensitive     Inducible Clindamycin NEGATIVE Sensitive     * >=100,000 COLONIES/mL STAPHYLOCOCCUS AUREUS  Blood Culture (routine x 2)     Status: Abnormal   Collection Time: 12/18/19 12:37 AM   Specimen: BLOOD  Result Value Ref Range Status   Specimen Description BLOOD RIGHT ARM  Final   Special Requests   Final    BOTTLES DRAWN AEROBIC AND ANAEROBIC Blood Culture adequate volume   Culture  Setup Time   Final    GRAM POSITIVE COCCI IN CLUSTERS IN BOTH AEROBIC AND ANAEROBIC BOTTLES CRITICAL RESULT CALLED TO, READ BACK BY AND VERIFIED WITH: Leonie Green PHARMD, AT 1302 12/18/19 BY Rush Landmark Performed at [facility], [place] [address], [place], [place] [address]    Culture STAPHYLOCOCCUS AUREUS (A)  Final   Report Status 12/20/2019 FINAL  Final   Organism ID, Bacteria STAPHYLOCOCCUS AUREUS  Final      Susceptibility   Staphylococcus aureus - MIC*    CIPROFLOXACIN <=0.5 SENSITIVE Sensitive     ERYTHROMYCIN 4 INTERMEDIATE Intermediate     GENTAMICIN <=0.5 SENSITIVE Sensitive     OXACILLIN <=0.25 SENSITIVE Sensitive     TETRACYCLINE <=1 SENSITIVE Sensitive     VANCOMYCIN 1 SENSITIVE Sensitive  TRIMETH/SULFA <=10 SENSITIVE Sensitive     CLINDAMYCIN <=0.25 SENSITIVE Sensitive     RIFAMPIN <=0.5 SENSITIVE Sensitive     Inducible Clindamycin NEGATIVE Sensitive     * STAPHYLOCOCCUS AUREUS  Culture, respiratory (non-expectorated)     Status: None (Preliminary result)   Collection Time: 12/18/19  7:44 AM   Specimen: Tracheal Aspirate; Respiratory  Result Value Ref Range Status   Specimen Description TRACHEAL ASPIRATE  Final   Special Requests NONE  Final   Gram Stain   Final    MODERATE WBC PRESENT, PREDOMINANTLY PMN MODERATE GRAM POSITIVE COCCI IN CLUSTERS RARE GRAM NEGATIVE RODS    Culture   Final    CULTURE REINCUBATED FOR BETTER GROWTH Performed at [facility], [place] [address], [place], [place] [address]    Report Status PENDING   Incomplete  Respiratory Panel by RT PCR (Flu A&B, Covid) - Nasopharyngeal Swab     Status: None   Collection Time: 12/18/19 10:28 AM   Specimen: Nasopharyngeal Swab  Result Value Ref Range Status   SARS Coronavirus 2 by RT PCR NEGATIVE NEGATIVE Final    Comment: (NOTE) SARS-CoV-2 target nucleic acids are NOT DETECTED. The SARS-CoV-2 RNA is generally detectable in upper respiratoy specimens during the acute phase of infection. The lowest concentration of SARS-CoV-2 viral copies this assay can detect is 131 copies/mL. A negative result does not preclude SARS-Cov-2 infection and should not be used as the sole basis for treatment or other patient management decisions. A negative result may occur with  improper specimen collection/handling, submission of specimen other than nasopharyngeal swab, presence of viral mutation(s) within the areas targeted by this assay, and inadequate number of viral copies (<131 copies/mL). A negative result must be combined with clinical observations, patient history, and epidemiological information. The expected result is Negative. Fact Sheet for Patients:  PinkCheek.be Fact Sheet for Healthcare Providers:  GravelBags.it This test is not yet ap proved or cleared by the [place] FDA and  has been authorized for detection and/or diagnosis of SARS-CoV-2 by FDA under an Emergency Use Authorization (EUA). This EUA will remain  in effect (meaning this test can be used) for the duration of the COVID-19 declaration under Section 564(b)(1) of the Act, 21 U.S.C. section 360bbb-3(b)(1), unless the authorization is terminated or revoked sooner.    Influenza A by PCR NEGATIVE NEGATIVE Final   Influenza B by PCR NEGATIVE NEGATIVE Final    Comment: (NOTE) The Xpert Xpress SARS-CoV-2/FLU/RSV assay is intended as an aid in  the diagnosis of influenza from Nasopharyngeal swab specimens and  should not be used  as a sole basis for treatment. Nasal washings and  aspirates are unacceptable for Xpert Xpress SARS-CoV-2/FLU/RSV  testing. Fact Sheet for Patients: PinkCheek.be Fact Sheet for Healthcare Providers: GravelBags.it This test is not yet approved or cleared by the [place] FDA and  has been authorized for detection and/or diagnosis of SARS-CoV-2 by  FDA under an Emergency Use Authorization (EUA). This EUA will remain  in effect (meaning this test can be used) for the duration of the  Covid-19 declaration under Section 564(b)(1) of the Act, 21  U.S.C. section 360bbb-3(b)(1), unless the authorization is  terminated or revoked. Performed at [facility], [employer] [address] [place], [place] [address]   Aerobic/Anaerobic Culture (surgical/deep wound)     Status: None (Preliminary result)   Collection Time: 12/18/19  5:02 PM   Specimen: Abscess  Result Value Ref Range Status   Specimen Description ABSCESS  THORACIC  Final   Special Requests NONE  Final   Gram Stain   Final    RARE WBC PRESENT, PREDOMINANTLY PMN NO ORGANISMS SEEN Performed at [facility], [place] [address] [place], [place] [address]    Culture   Final    FEW STAPHYLOCOCCUS AUREUS NO ANAEROBES ISOLATED; CULTURE IN PROGRESS FOR 5 DAYS    Report Status PENDING  Incomplete   Organism ID, Bacteria STAPHYLOCOCCUS AUREUS  Final      Susceptibility   Staphylococcus aureus - MIC*    CIPROFLOXACIN <=0.5 SENSITIVE Sensitive     ERYTHROMYCIN >=8 RESISTANT Resistant     GENTAMICIN <=0.5 SENSITIVE Sensitive     OXACILLIN 0.5 SENSITIVE Sensitive     TETRACYCLINE <=1 SENSITIVE Sensitive     VANCOMYCIN 1 SENSITIVE Sensitive     TRIMETH/SULFA <=10 SENSITIVE Sensitive     CLINDAMYCIN <=0.25 SENSITIVE Sensitive     RIFAMPIN <=0.5 SENSITIVE Sensitive     Inducible Clindamycin NEGATIVE Sensitive     * FEW STAPHYLOCOCCUS AUREUS  MRSA PCR Screening     Status: None    Collection Time: 12/18/19  6:12 PM   Specimen: Nasal Mucosa; Nasopharyngeal  Result Value Ref Range Status   MRSA by PCR NEGATIVE NEGATIVE Final    Comment:        The GeneXpert MRSA Assay (FDA approved for NASAL specimens only), is one component of a comprehensive MRSA colonization surveillance program. It is not intended to diagnose MRSA infection nor to guide or monitor treatment for MRSA infections. Performed at [facility], [place] [address] [place], [place] [address]   Culture, blood (routine x 2)     Status: None (Preliminary result)   Collection Time: 12/19/19  6:59 AM   Specimen: BLOOD  Result Value Ref Range Status   Specimen Description BLOOD RIGHT ANTECUBITAL  Final   Special Requests   Final    BOTTLES DRAWN AEROBIC AND ANAEROBIC Blood Culture adequate volume   Culture   Final    NO GROWTH 2 DAYS Performed at [facility], [place] [address], [place], [place] [address]    Report Status PENDING  Incomplete    Impression/Plan:  1. Epidural abscess - on cefazolin through February 10th (6 weeks).  Will need weekly CRP and ESR.  Baseline ESR 52, CRP 30.5.  I will place OPAT consult  2.  Right apical mass - CT done and no mass reported. He will follow up with Dr. Annamaria Boots.    3.  Bacteremia - MSSA and on treatment as above.   OK from ID standpoint for PICC line tomorrow if the repeat cultures remain negative.   I have arranged follow up with [place] in about 4 weeks   I will sign off, call with questions.

## 2019-12-21 NOTE — Progress Notes (Signed)
CT results called to to Dr. Saintclair Halsted with no new orders at this time. See addendum by Dr. Nyoka Cowden in CT result.
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
## 2019-12-21 NOTE — Progress Notes (Signed)
Patient ID: Kent Flowers, male   DOB: 1946-12-14, 74 y.o.   MRN: [id number] Overall patient stable neurologically following decompressive thoracic laminectomy with may be some increase strength in lower extremities.  Continues to have right upper extremity weakness a little bit of distal left upper extremity weakness MRI right shoulder shows apical lung mass this could be irritating right brachial plexus CT scan chest has been ordered.  Continue to mobilize with physical and Occupational Therapy
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
## 2019-12-21 NOTE — Progress Notes (Signed)
NAME:  Kent Flowers, MRN:  [id number], DOB:  08/27/1946, LOS: 3 ADMISSION DATE:  12/17/2019, CONSULTATION DATE:  12/18/2019 REFERRING MD:  Dr. Zada Finders, CHIEF COMPLAINT:  Vent managment  Brief History   6 yoM presenting 12/31 with fever, fatigue and right upper extremity weakness found to have thoracic epidural abscess who went to OR for evacuation and laminectomy of T2 and T3 for spinal epidural abscess returning to ICU on mechanical ventilation.  PCCM consulted for vent management.   Past Medical History  COPD, tobacco (intermittently, quit few months ago) and ETOH use (few glasses of wine nightly), chronic back pain w/sciatica, squamous cell carcinoma, PBH, peyronie disease  Significant Hospital Events   12/31 Admit to IMTS/OR  Consults:  NSGY PCCM  Procedures:  12/31 ETT >> 12/19/2019 12/31 foley >> 12/19/2019  12/31 thoracic laminectomy for epidural abscess   Significant Diagnostic Tests:  12/18/2019 CTH >> 1. No acute intracranial abnormality. 2. Unchanged atrophy and chronic small vessel ischemia.  12/18/2019 CTA PE >> 1. Upper thoracic paravertebral stranding also involving the right-sided thoracic inlet. No underlying fracture or erosion is seen. Given there is both history of recent trauma and fever would suggest MRI to evaluate for occult injury or spinal infection. 2. Negative for pulmonary embolism. 3. Aortic Atherosclerosis (ICD10-I70.0) and Emphysema (ICD10-J43.9). Coronary atherosclerosis.  12/31 MRI cervical/ thoracic  >> Upper cervical spinous process mild marrow edema and interspinous soft tissue edema probably related to history of recent trauma.  Possible thin dorsal extension of thoracic epidural process into the cervical spine.  Multilevel degenerative changes with suboptimal evaluation due to artifact.  Echo 1/1-LVEF 50-55%, no obvious valvular vegetations  MRI right shoulder 1/2 >> No findings suggestive of septic arthritis. ? Pleural based right  apical mass 12/20/2018 CTA chest ordered>>  Micro Data:  12/31 SARS 2/ flu A/B >> neg 12/31 BCx 2 >> GPC clusters >> 12/31 MRSA PCR >> neg 12/31 epidural abscess culture >> 12/18/2019 urine culture with staph aureus>>  Antimicrobials:  12/31 vanc >> off 12/31 aztreonam >> off 12/31 flagyl off 12/20/2019 Ancef>> Interim history/subjective:  No acute distress pulmonary asked to to continue to see due to lung mass  Objective   Blood pressure (!) 168/86, pulse 98, temperature 100.3 F (37.9 C), temperature source Oral, resp. rate 20, height 5\' 8"  (1.727 m), weight 80.3 kg, SpO2 97 %.        Intake/Output Summary (Last 24 hours) at 12/21/2019 0942 Last data filed at 12/21/2019 0900 Gross per 24 hour  Intake 1436.11 ml  Output 2850 ml  Net -1413.89 ml   Filed Weights   12/18/19 0003 12/18/19 1800  Weight: 77.1 kg 80.3 kg   Examination: General: Ill-appearing male no acute distress HEENT: MM pink/moist no JVD no lymphadenopathy is appreciated Neuro: Right upper extremity weakness CV: Heart sounds are distant  PULM: Decreased breath sounds in the bases GI: soft, bsx4 active  Extremities: warm/dry,  edema none we will have abrasions on knees Skin: no rashes or lesions   Resolved Hospital Problem list    Assessment & Plan:   Thoracic epidural abscess s/p evacuation and laminectomies of T2, T3 P:  Per neurosurgery  Leukocytosis with thoracic epidural abscess but likely bacteremic, prelim BCx2 showing GPCs urine culture with staph aureus P:  ID is controlling antimicrobial therapy  Vent dependent respiratory failure secondary to anesthesia surgery with underlying COPD Respiratory insufficiency related to above Hx COPD, tobacco abuse - f/b Dr. Tarri Fuller, stopped anoro in 09/2019 as trial/  patient didn't see benefit on it P:  48 hours off the ventilator CT the chest is ordered for further evaluation of right upper lung mass  Right apical mass P:  MRI left shoulder reviewed  with right apical mass.  This was present on recent CT but not reported and is more obvious on the MRI.  Reviewed scan with the radiologist who feels that this looks more like a soft tissue mass and not an inflammatory collection or abscess. Concern for malignancy as patient is a smoker CT the chest has been ordered.  12/21/2019 He will need follow-up with outpatient with his pulmonologist Dr. Annamaria Boots and with PET scan.  Acute encephalopathy related to sedation, Hx ETOH use- daily wine drinker Hx chronic pain - OA, knees/ shoulder   unclear last ETOH use, known per wife is 12/25 but she has been hospitalized P:  Empirical thiamine and folic acid Monitor for signs and symptoms of withdrawal Per primary  Best practice:  Diet: PO diet Pain/Anxiety/Delirium protocol (if indicated): NA VAP protocol (if indicated): NA DVT prophylaxis: SCDs only, add VTE when ok with NSGY GI prophylaxis: PPI Glucose control: trend on BMP Mobility: BR  Code Status: DNR  Family Communication: wife updated by phone by Dr. Orpah Melter.  She is currently hospitalized at [facility] on 6th floor with PNA and gallbladder issues.  12/19/2019 plan for extubation.  We will update wife later post extubation. Disposition: Transfer to [facility].  12/21/2019 pulmonary again asked to respond to me for CT of chest for right upper lung mass.  We did go ahead and order a CT of the chest for further evaluation treatment of suspected lung mass  Labs   CBC: Recent Labs  Lab 12/14/19 1459 12/18/19 0034 12/18/19 1951 12/19/19 0244 12/19/19 0408 12/20/19 1144 12/21/19 0515  WBC 17.8* 22.8*  --   --  20.7* 20.7* 24.0*  NEUTROABS  --  19.4*  --   --   --   --   --   HGB 16.4 14.9 12.2* 12.9* 13.5 14.3 14.5  HCT 50.6 44.4 36.0* 38.0* 40.4 43.0 42.8  MCV 98.6 94.9  --   --  96.0 94.7 93.4  PLT 230 207  --   --  195 289 [id number]    Basic Metabolic Panel: Recent Labs  Lab 12/14/19 1459 12/18/19 0034 12/18/19 1700 12/18/19 1951 12/19/19 0244  12/19/19 0408 12/20/19 0526 12/20/19 1144 12/21/19 0515  NA 133* 133*  --  132* 136 138 136  --  139  K 3.7 3.5  --  3.6 3.7 3.7 3.3*  --  3.2*  CL 97* 94*  --   --   --  103 99  --  98  CO2 25 25  --   --   --  23 26  --  28  GLUCOSE 145* 137*  --   --   --  158* 152*  --  133*  BUN 14 25*  --   --   --  19 16  --  13  CREATININE 0.89 1.04  --   --   --  0.80 0.66 0.80 0.79  CALCIUM 8.7* 8.8*  --   --   --  7.9* 7.9*  --  8.2*  MG  --   --  1.9  --   --   --  2.2  --  2.2  PHOS  --   --  2.9  --   --   --  1.9*  --  3.1   GFR: Estimated Creatinine Clearance: 80.8 mL/min (by C-G formula based on SCr of 0.79 mg/dL). Recent Labs  Lab 12/18/19 0034 12/18/19 0222 12/19/19 0408 12/20/19 1144 12/21/19 0515  WBC 22.8*  --  20.7* 20.7* 24.0*  LATICACIDVEN 1.4 1.1  --   --   --     Liver Function Tests: Recent Labs  Lab 12/18/19 0034  AST 38  ALT 27  ALKPHOS 69  BILITOT 1.2  PROT 6.6  ALBUMIN 3.0*   No results for input(s): LIPASE, AMYLASE in the last 168 hours. No results for input(s): AMMONIA in the last 168 hours.  ABG    Component Value Date/Time   PHART 7.478 (H) 12/19/2019 0244   PCO2ART 34.7 12/19/2019 0244   PO2ART 103.0 12/19/2019 0244   HCO3 25.4 12/19/2019 0244   TCO2 26 12/19/2019 0244   O2SAT 98.0 12/19/2019 0244     Coagulation Profile: Recent Labs  Lab 12/18/19 0034  INR 1.3*    Cardiac Enzymes: No results for input(s): CKTOTAL, CKMB, CKMBINDEX, TROPONINI in the last 168 hours.  HbA1C: Hgb A1c MFr Bld  Date/Time Value Ref Range Status  12/19/2019 04:08 AM 5.4 4.8 - 5.6 % Final    Comment:    (NOTE) Pre diabetes:          5.7%-6.4% Diabetes:              >6.4% Glycemic control for   <7.0% adults with diabetes   12/18/2019 06:19 PM 5.3 4.8 - 5.6 % Final    Comment:    (NOTE) Pre diabetes:          5.7%-6.4% Diabetes:              >6.4% Glycemic control for   <7.0% adults with diabetes     CBG: Recent Labs  Lab 12/19/19 2226  12/20/19 0716 12/20/19 1128 12/20/19 1640 12/21/19 0720  GLUCAP 189* 139* 139* 162* Delano Dayannara Pascal ACNP Acute Care Nurse Practitioner [place] Please consult [place] 12/21/2019, 9:42 AM

## 2019-12-21 NOTE — Progress Notes (Signed)
Internal Medicine Attending:   I saw and examined the patient. I reviewed Dr Redgie Grayer note and I agree with the resident's findings and plan as documented in the resident's note.  Continues to have some fevers but on appropriate antibiotics.  Patient's main concern is pain control, he has chronic oxycodone use at home but now with increased pain on his back and right upper extremity.  We were to adjust his pain medication regimen today.  We are also working up a right apical mass that was incidentally found on MRI, there is some concern this may be compressing the right brachial plexus. Other issues for today patient reports no bowel movement in 7 days, will escalate bowel regimen.
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
## 2019-12-22 ENCOUNTER — Telehealth: Payer: Self-pay | Admitting: *Deleted

## 2019-12-22 ENCOUNTER — Inpatient Hospital Stay: Payer: Self-pay

## 2019-12-22 ENCOUNTER — Other Ambulatory Visit: Payer: Self-pay | Admitting: *Deleted

## 2019-12-22 DIAGNOSIS — R918 Other nonspecific abnormal finding of lung field: Secondary | ICD-10-CM

## 2019-12-22 DIAGNOSIS — R911 Solitary pulmonary nodule: Secondary | ICD-10-CM

## 2019-12-22 LAB — BASIC METABOLIC PANEL
Anion gap: 8 (ref 5–15)
BUN: 16 mg/dL (ref 8–23)
CO2: 30 mmol/L (ref 22–32)
Calcium: 7.9 mg/dL — ABNORMAL LOW (ref 8.9–10.3)
Chloride: 98 mmol/L (ref 98–111)
Creatinine, Ser: 0.79 mg/dL (ref 0.61–1.24)
GFR calc Af Amer: >60 mL/min (ref >60)
GFR calc non Af Amer: >60 mL/min (ref >60)
Glucose, Bld: 133 mg/dL — ABNORMAL HIGH (ref 70–99)
Potassium: 3.5 mmol/L (ref 3.5–5.1)
Sodium: 136 mmol/L (ref 135–145)

## 2019-12-22 LAB — GLUCOSE, CAPILLARY
Glucose-Capillary: 181 mg/dL — ABNORMAL HIGH (ref 70–99)
Glucose-Capillary: 125 mg/dL — ABNORMAL HIGH (ref 70–99)
Glucose-Capillary: 128 mg/dL — ABNORMAL HIGH (ref 70–99)
Glucose-Capillary: 134 mg/dL — ABNORMAL HIGH (ref 70–99)

## 2019-12-22 LAB — CBC
HCT: 41.4 % (ref 39.0–52.0)
Hemoglobin: 13.7 g/dL (ref 13.0–17.0)
MCH: 31.2 pg (ref 26.0–34.0)
MCHC: 33.1 g/dL (ref 30.0–36.0)
MCV: 94.3 fL (ref 80.0–100.0)
Platelets: 421 10*3/uL — ABNORMAL HIGH (ref 150–400)
RBC: 4.39 MIL/uL (ref 4.22–5.81)
RDW: 13.4 % (ref 11.5–15.5)
WBC: 24.9 10*3/uL — ABNORMAL HIGH (ref 4.0–10.5)
nRBC: 0 % (ref 0.0–0.2)

## 2019-12-22 MED ORDER — KETOROLAC TROMETHAMINE 30 MG/ML IJ SOLN
30.0000 mg | Freq: Three times a day (TID) | INTRAMUSCULAR | Status: DC | PRN
  Administered 2019-12-22 – 2019-12-23 (×2): 30 mg via INTRAVENOUS
  Filled 2019-12-22 (×2): qty 1

## 2019-12-22 MED ORDER — MAGNESIUM CITRATE PO SOLN: 0.5000 | Freq: Once | ORAL | Status: DC | PRN

## 2019-12-22 MED ORDER — DOCUSATE SODIUM 50 MG/5ML PO LIQD
100.0000 mg | Freq: Two times a day (BID) | ORAL | Status: DC
  Administered 2019-12-22 – 2019-12-24 (×4): 100 mg via ORAL
  Filled 2019-12-22 (×6): qty 10

## 2019-12-22 MED ORDER — POLYETHYLENE GLYCOL 3350 17 G PO PACK
17.0000 g | PACK | Freq: Three times a day (TID) | ORAL | Status: DC
  Administered 2019-12-22 – 2019-12-23 (×5): 17 g via ORAL
  Filled 2019-12-22 (×10): qty 1

## 2019-12-22 MED ORDER — FENTANYL CITRATE (PF) 100 MCG/2ML IJ SOLN
12.5000 ug | INTRAMUSCULAR | Status: DC | PRN
  Administered 2019-12-23 – 2019-12-24 (×2): 12.5 ug via INTRAVENOUS
  Filled 2019-12-22 (×2): qty 2

## 2019-12-22 MED ORDER — PANTOPRAZOLE SODIUM 40 MG PO TBEC
40.0000 mg | DELAYED_RELEASE_TABLET | Freq: Every day | ORAL | Status: DC
  Administered 2019-12-23 – 2019-12-25 (×3): 40 mg via ORAL
  Filled 2019-12-22 (×3): qty 1

## 2019-12-22 NOTE — Progress Notes (Signed)
Pt forgetful at times. Spoke with patients daughter. She would like to speak to a provider to discuss patient test results as she does not trust patient to remember well. Patient started on Toradol today which has been slightly more effective than opioids alone. When doing patient education on Toradol patient wanted to talk about opioids. He addressed them as opioids. This nurse encouraged him to give the NSAID a chance and he said he would. He says this evening his pain is less than it was before. Patient is sitting in chair watching television. Katherina Right RN
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
## 2019-12-22 NOTE — Progress Notes (Signed)
Subjective:   Pt seen at the bedside this AM.  States that he ate some breakfast. He reports that he feels terrible and it "hurts like hell." He states that he only receives pain relief for 15-45 minutes. He states that his pain is not tolerable and if he could jump, he will say "get me some help."  States that he has not had a bowel movement yet.  Objective:  Vital signs in last 24 hours: Vitals:   12/22/19 0321 12/22/19 0741 12/22/19 0918 12/22/19 0923  BP: (!) 149/74 (!) 151/80 (!) 161/87   Pulse: 95 95 (!) 102   Resp: 18 20 18    Temp: 99 F (37.2 C) 100.3 F (37.9 C) 98.4 F (36.9 C)   TempSrc: Oral Axillary    SpO2: 97% 90% 91% 90%  Weight:      Height:       Physical exam General: Laying in bed and appears to be more comfort than he describes.  Diaphoretic HEENT: NCAT CV: RRR, normal S1-S2 no MRG appreciated PULM: Diffuse wheezing appreciated in all lung fields ABD: Soft and nontender in all quadrants NEURO: Moves LUE, LLE, and RLE spontaneously antigravity. 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 The patient is now s/p thoracic laminectomy for epidural abscess, POD 5. His course was complicated by prolonged extubation. He continues to have right arm weakness that was suspected to be from a right apical lung mass discovered on MRI of the right shoulder.  CT of the chest was ordered as a follow-up, but did not comment on a right apical lung  mass.  #Fever #Thoracic epidural abscess #MSSA bacteremia #Acute RUE weakness: POD5 for thoracic laminectomy for abscess.  MSSA bacteremia.  TTE negative for endocarditis.  Do not believe TEE is necessary at this time, given the fact the patient needs 6 to 8 weeks of IV antibiotics per ID. Discovery of endocarditis would not change his management. -ID signed off, appreciate recommendations  -IV cefazolin 2 g every 8 hours for the next 6 to 8 weeks. ID to place OPAT consult.   -PICC line ordered  -F/u with ID in 4 weeks  -F/u repeat blood cultures  -PT/OT consults ordered  #Acute on chronic back pain: Patient has a history of degenerative spine disease and is on chronic opiates at home.  Patient reports that his pain is not controlled, and perseverates on his pain medication regimen despite [place] addressing it and telling him that we would make some adjustments.  Patient was saturating at 90% on room air during her visit and was breathing slowly.  I do not want to increase his opioid regimen at this time. -Toradol 30 mg every 8 hours as needed ordered -Decrease Fentanyl from 50 mcg to 12.23mcg q2hrs PRN -Oxycodone IR 15 mg every 4 hours scheduled -Oxycodone IR 5 mg every 4 hours as needed for breakthrough pain -Gabapentin 300 mg nightly  #R apical mass: Incidentally found on MRI of the right shoulder. High suspicion for malignancy given his  smoking history -CT chest did not show apical mass, will reach out to radiology to discuss CT chest results -Pt to follow this up with Dr. Annamaria Boots  #Consptiation: Patient states he has not had a bowel movement in 7 days. -MiraLAX 17g twice daily -Senokot tablet daily -Colace 100 mg twice daily -Mag citrate half bottle as needed ordered -Dulcolax 10 mg suppository daily as needed  #Alcohol use: Patient stated he drinks 3-4 drinks every other day. -Folic acid 1 mg daily -Thiamine 100 mg daily -Multivitamin with minerals 1 tablet daily  #Hx  COPD -Albuterol 2 puffs every 6 hours as needed  #FEN/GI -Diet: Heart healthy -Fluids: LR 10 cc/hour  #DVT prophylaxis -Lovenox 40 mg subq injections daily  #CODE STATUS: DNR  #Dispo: Pending medical course. PT/OT recommending SNF placement.   Earlene Plater, MD Internal Medicine, PGY1 Pager: [phone]  12/22/2019,11:16 AM

## 2019-12-22 NOTE — Telephone Encounter (Signed)
-----   Message from Marshell Garfinkel, MD sent at 12/20/2019 10:04 AM EST ----- Regarding: PET scan and follow up Pt of Dr. Annamaria Boots being seen in hospital  He has new findings of right apical soft tissue mass. Will need PET scan ordered in the next 2 weeks and follow up with Dr. Annamaria Boots. Thanks  [employer]

## 2019-12-22 NOTE — Progress Notes (Signed)
PHARMACY CONSULT NOTE FOR:  OUTPATIENT  PARENTERAL ANTIBIOTIC THERAPY (OPAT)  Indication: MSSA bacteremia, epidural abscess Regimen: cefazolin 2g IV q8h End date: 01/28/2020  IV antibiotic discharge orders are pended. To discharging provider:  please sign these orders via discharge navigator,  Select New Orders & click on the button choice - Manage This Unsigned Work.     Thank you for allowing pharmacy to be a part of this patient's care.  Candie Mile 12/22/2019, 11:24 AM
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
## 2019-12-22 NOTE — Anesthesia Postprocedure Evaluation (Signed)
Anesthesia Post Note  Patient: YAEL FUEHRER  Procedure(s) Performed: THORACIC LAMINECTOMY FOR EPIDURAL ABSCESS Thoracic Two, Thoracic Three (N/A Spine Thoracic)     Patient location during evaluation: PACU Anesthesia Type: General Level of consciousness: awake and alert Pain management: pain level controlled Vital Signs Assessment: post-procedure vital signs reviewed and stable Respiratory status: spontaneous breathing, nonlabored ventilation, respiratory function stable and patient connected to nasal cannula oxygen Cardiovascular status: blood pressure returned to baseline and stable Postop Assessment: no apparent nausea or vomiting Anesthetic complications: no    Last Vitals:  Vitals:   12/22/19 1100 12/22/19 1340  BP: (!) 148/93   Pulse:    Resp:    Temp: 36.8 C   SpO2:  92%    Last Pain:  Vitals:   12/22/19 1310  TempSrc:   PainSc: 4                  Reichen Hutzler S

## 2019-12-22 NOTE — Consult Note (Signed)
   [facility] Inpatient Consult   12/22/2019  Kadarious Risper [facility] 05-Nov-1946 [id number]  Alerted by RN Care Coordinator of patient's admission.  [employer] Patient is pending status with [facility] for care management.  Patient has been referred to [facility] prior to admission.    Primary Care Provider is: Laurel Dimmer, NP of [facility].  This provider listed in not an affiliate of [facility].  Plan: Will follow up regarding referral and the primary care provider when appropriate  And progress/disposition.   Of note, [facility] Care Management services does not replace or interfere with any services that are needed or arranged by inpatient [facility] care management team.  For additional questions or referrals please contact:  Natividad Brood, RN BSN [facility] Liaison  [phone] business mobile phone Toll free office [phone]  Fax number: [phone] [email or web address]

## 2019-12-22 NOTE — Telephone Encounter (Signed)
PET ordered to be completed in 2 wks

## 2019-12-22 NOTE — Progress Notes (Signed)
Neurosurgery Service Progress Note  Subjective: No acute events overnight, still complaining of breakthrough back pain   Objective: Vitals:   12/22/19 0321 12/22/19 0741 12/22/19 0918 12/22/19 0923  BP: (!) 149/74 (!) 151/80 (!) 161/87   Pulse: 95 95 (!) 102   Resp: 18 20 18    Temp: 99 F (37.2 C) 100.3 F (37.9 C) 98.4 F (36.9 C)   TempSrc: Oral Axillary    SpO2: 97% 90% 91% 90%  Weight:      Height:       Temp (24hrs), Avg:99.1 F (37.3 C), Min:98.4 F (36.9 C), Max:100.3 F (37.9 C)  CBC Latest Ref Rng & Units 12/22/2019 12/21/2019 12/20/2019  WBC 4.0 - 10.5 K/uL 24.9(H) 24.0(H) 20.7(H)  Hemoglobin 13.0 - 17.0 g/dL 13.7 14.5 14.3  Hematocrit 39.0 - 52.0 % 41.4 42.8 43.0  Platelets 150 - 400 K/uL 421(H) 355 289   BMP Latest Ref Rng & Units 12/22/2019 12/21/2019 12/20/2019  Glucose 70 - 99 mg/dL 133(H) 133(H) -  BUN 8 - 23 mg/dL 16 13 -  Creatinine 0.61 - 1.24 mg/dL 0.79 0.79 0.80  Sodium 135 - 145 mmol/L 136 139 -  Potassium 3.5 - 5.1 mmol/L 3.5 3.2(L) -  Chloride 98 - 111 mmol/L 98 98 -  CO2 22 - 32 mmol/L 30 28 -  Calcium 8.9 - 10.3 mg/dL 7.9(L) 8.2(L) -    Intake/Output Summary (Last 24 hours) at 12/22/2019 1100 Last data filed at 12/22/2019 0900 Gross per 24 hour  Intake 480 ml  Output 1250 ml  Net -770 ml    Current Facility-Administered Medications:  .  acetaminophen (TYLENOL) tablet 650 mg, 650 mg, Oral, Q6H PRN, Earlene Plater, MD, 650 mg at 12/20/19 1615 .  albuterol (PROVENTIL) (2.5 MG/3ML) 0.083% nebulizer solution 2.5 mg, 2.5 mg, Nebulization, Q4H PRN, Jennelle Human B, NP .  amLODipine (NORVASC) tablet 10 mg, 10 mg, Oral, Daily, Earlene Plater, MD, 10 mg at 12/22/19 1001 .  bisacodyl (DULCOLAX) suppository 10 mg, 10 mg, Rectal, Daily PRN, Jennelle Human B, NP .  ceFAZolin (ANCEF) IVPB 2g/100 mL premix, 2 g, Intravenous, Q8H, Bertis Ruddy, RPH, Last Rate: 200 mL/hr at 12/22/19 0525, 2 g at 12/22/19 0525 .  docusate (COLACE) 50 MG/5ML liquid 100 mg, 100  mg, Oral, BID PRN, Minor, Grace Bushy, NP, 100 mg at 12/20/19 0339 .  enoxaparin (LOVENOX) injection 40 mg, 40 mg, Subcutaneous, Q24H, Agyei, Obed K, MD, 40 mg at 12/21/19 2200 .  fentaNYL (SUBLIMAZE) injection 50 mcg, 50 mcg, Intravenous, Q2H PRN, Julian Hy, DO, 50 mcg at 12/22/19 [id number] .  folic acid (FOLVITE) tablet 1 mg, 1 mg, Oral, Daily, Agyei, Obed K, MD, 1 mg at 12/22/19 1001 .  gabapentin (NEURONTIN) capsule 300 mg, 300 mg, Oral, QHS, Minor, Grace Bushy, NP, 300 mg at 12/21/19 2201 .  hydrALAZINE (APRESOLINE) injection 10 mg, 10 mg, Intravenous, Q6H PRN, Margaretha Seeds, MD, 10 mg at 12/20/19 1644 .  insulin aspart (novoLOG) injection 0-5 Units, 0-5 Units, Subcutaneous, QHS, Clark, Laura P, DO .  insulin aspart (novoLOG) injection 0-9 Units, 0-9 Units, Subcutaneous, TID WC, Julian Hy, DO, 2 Units at 12/22/19 1005 .  ipratropium-albuterol (DUONEB) 0.5-2.5 (3) MG/3ML nebulizer solution 3 mL, 3 mL, Nebulization, TID, Jennelle Human B, NP, 3 mL at 12/22/19 0923 .  lactated ringers infusion, , Intravenous, Continuous, Myrtie Soman, MD, New Bag at 12/18/19 1708 .  MEDLINE mouth rinse, 15 mL, Mouth Rinse, BID, Ramaswamy, Murali, MD, 15 mL at 12/21/19  2202 .  Melatonin TABS 3 mg, 3 mg, Oral, QHS PRN, Brand Males, MD, 3 mg at 12/21/19 0114 .  multivitamin with minerals tablet 1 tablet, 1 tablet, Oral, Daily, Agyei, Obed K, MD, 1 tablet at 12/22/19 1002 .  oxyCODONE (Oxy IR/ROXICODONE) immediate release tablet 15 mg, 15 mg, Oral, Q4H while awake, Mannam, Praveen, MD, 15 mg at 12/22/19 1002 .  oxyCODONE (Oxy IR/ROXICODONE) immediate release tablet 5 mg, 5 mg, Oral, Q4H PRN, Julian Hy, DO, 5 mg at 12/22/19 0233 .  pantoprazole (PROTONIX) injection 40 mg, 40 mg, Intravenous, Daily, Jennelle Human B, NP, 40 mg at 12/22/19 1002 .  polyethylene glycol (MIRALAX / GLYCOLAX) packet 17 g, 17 g, Oral, TID, Agyei, Obed K, MD, 17 g at 12/22/19 1002 .  senna (SENOKOT) tablet 8.6 mg, 1 tablet,  Oral, Daily, Agyei, Obed K, MD, 8.6 mg at 12/22/19 1002 .  thiamine tablet 100 mg, 100 mg, Oral, Daily, 100 mg at 12/22/19 1003 **OR** thiamine (B-1) injection 100 mg, 100 mg, Intravenous, Daily, Agyei, Obed K, MD, 100 mg at 12/21/19 1029   Physical Exam: AOx3, no tremors / diaphoresis Strength 5/5 in BLE, SILTx4 LUE 5/5 RUE D 5/5, B 4+/5, T 4+/5, WE 4/5, G 4-/5  Assessment & Plan: 74 y.o. man s/p evacuation of upper thoracic epidural abscess, recovering well.  -no change in neurosurgical plan of care  Marcello Moores A Neysha Criado  12/22/19 11:00 AM

## 2019-12-23 LAB — URINALYSIS, ROUTINE W REFLEX MICROSCOPIC
Bilirubin Urine: NEGATIVE
Glucose, UA: NEGATIVE mg/dL
Hgb urine dipstick: NEGATIVE
Ketones, ur: NEGATIVE mg/dL
Leukocytes,Ua: NEGATIVE
Nitrite: NEGATIVE
Protein, ur: NEGATIVE mg/dL
Specific Gravity, Urine: 1.013 (ref 1.005–1.030)
pH: 6 (ref 5.0–8.0)

## 2019-12-23 LAB — GLUCOSE, CAPILLARY
Glucose-Capillary: 109 mg/dL — ABNORMAL HIGH (ref 70–99)
Glucose-Capillary: 109 mg/dL — ABNORMAL HIGH (ref 70–99)
Glucose-Capillary: 169 mg/dL — ABNORMAL HIGH (ref 70–99)
Glucose-Capillary: 88 mg/dL (ref 70–99)
Glucose-Capillary: 164 mg/dL — ABNORMAL HIGH (ref 70–99)

## 2019-12-23 LAB — CBC
HCT: 38.1 % — ABNORMAL LOW (ref 39.0–52.0)
Hemoglobin: 12.5 g/dL — ABNORMAL LOW (ref 13.0–17.0)
MCH: 31.7 pg (ref 26.0–34.0)
MCHC: 32.8 g/dL (ref 30.0–36.0)
MCV: 96.7 fL (ref 80.0–100.0)
Platelets: 435 10*3/uL — ABNORMAL HIGH (ref 150–400)
RBC: 3.94 MIL/uL — ABNORMAL LOW (ref 4.22–5.81)
RDW: 13.4 % (ref 11.5–15.5)
WBC: 21.9 10*3/uL — ABNORMAL HIGH (ref 4.0–10.5)
nRBC: 0 % (ref 0.0–0.2)

## 2019-12-23 LAB — AEROBIC/ANAEROBIC CULTURE W GRAM STAIN (SURGICAL/DEEP WOUND)

## 2019-12-23 LAB — BASIC METABOLIC PANEL
Anion gap: 10 (ref 5–15)
BUN: 23 mg/dL (ref 8–23)
CO2: 31 mmol/L (ref 22–32)
Calcium: 7.8 mg/dL — ABNORMAL LOW (ref 8.9–10.3)
Chloride: 97 mmol/L — ABNORMAL LOW (ref 98–111)
Creatinine, Ser: 0.87 mg/dL (ref 0.61–1.24)
GFR calc Af Amer: >60 mL/min (ref >60)
GFR calc non Af Amer: >60 mL/min (ref >60)
Glucose, Bld: 127 mg/dL — ABNORMAL HIGH (ref 70–99)
Potassium: 3.7 mmol/L (ref 3.5–5.1)
Sodium: 138 mmol/L (ref 135–145)

## 2019-12-23 LAB — CULTURE, RESPIRATORY W GRAM STAIN

## 2019-12-23 MED ORDER — CHLORHEXIDINE GLUCONATE CLOTH 2 % EX PADS
6.0000 | MEDICATED_PAD | Freq: Every day | CUTANEOUS | Status: DC
  Administered 2019-12-23 – 2019-12-25 (×3): 6 via TOPICAL

## 2019-12-23 MED ORDER — ACETAMINOPHEN 325 MG PO TABS
650.0000 mg | ORAL_TABLET | ORAL | Status: DC
  Administered 2019-12-23 – 2019-12-25 (×13): 650 mg via ORAL
  Filled 2019-12-23 (×13): qty 2

## 2019-12-23 MED ORDER — KETOROLAC TROMETHAMINE 30 MG/ML IJ SOLN
30.0000 mg | Freq: Three times a day (TID) | INTRAMUSCULAR | Status: DC
  Administered 2019-12-23 – 2019-12-25 (×7): 30 mg via INTRAVENOUS
  Filled 2019-12-23 (×7): qty 1

## 2019-12-23 MED ORDER — SODIUM CHLORIDE 0.9% FLUSH
10.0000 mL | Freq: Two times a day (BID) | INTRAVENOUS | Status: DC
  Administered 2019-12-23 – 2019-12-24 (×3): 10 mL
  Administered 2019-12-25: 20 mL

## 2019-12-23 MED ORDER — SODIUM CHLORIDE 0.9% FLUSH: 10.0000 mL | INTRAVENOUS | Status: DC | PRN

## 2019-12-23 NOTE — Progress Notes (Signed)
Peripherally Inserted Central Catheter/Midline Placement  The IV Nurse has discussed with the patient and/or persons authorized to consent for the patient, the purpose of this procedure and the potential benefits and risks involved with this procedure.  The benefits include less needle sticks, lab draws from the catheter, and the patient may be discharged home with the catheter. Risks include, but not limited to, infection, bleeding, blood clot (thrombus formation), and puncture of an artery; nerve damage and irregular heartbeat and possibility to perform a PICC exchange if needed/ordered by physician.  Alternatives to this procedure were also discussed.  Bard Power PICC patient education guide, fact sheet on infection prevention and patient information card has been provided to patient /or left at bedside.    PICC/Midline Placement Documentation  PICC Single Lumen [id number] PICC Right Basilic 38 cm (Active)  Indication for Insertion or Continuance of Line Home intravenous therapies (PICC only) 12/23/19 0800  Exposed Catheter (cm) 0 cm 12/23/19 0800  Site Assessment Clean;Dry;Intact 12/23/19 0800  Line Status Flushed;Blood return noted 12/23/19 0800  Dressing Type Transparent 12/23/19 0800  Dressing Status Clean;Dry;Intact;Antimicrobial disc in place 12/23/19 0800  Dressing Change Due 12/30/19 12/23/19 0800       Jule Economy Horton 12/23/2019, 8:43 AM

## 2019-12-23 NOTE — Progress Notes (Signed)
Neurosurgery Service Progress Note  Subjective: No acute events overnight, still complaining of breakthrough back pain   Objective: Vitals:   12/23/19 0400 12/23/19 0753 12/23/19 0922 12/23/19 1134  BP: 106/65 (!) 145/72  129/62  Pulse: 78 77  89  Resp: 15 20  18   Temp:  98.4 F (36.9 C)  98.1 F (36.7 C)  TempSrc:      SpO2: 91% 99% 93% 95%  Weight:      Height:       Temp (24hrs), Avg:98.7 F (37.1 C), Min:98.1 F (36.7 C), Max:100 F (37.8 C)  CBC Latest Ref Rng & Units 12/23/2019 12/22/2019 12/21/2019  WBC 4.0 - 10.5 K/uL 21.9(H) 24.9(H) 24.0(H)  Hemoglobin 13.0 - 17.0 g/dL 12.5(L) 13.7 14.5  Hematocrit 39.0 - 52.0 % 38.1(L) 41.4 42.8  Platelets 150 - 400 K/uL 435(H) 421(H) 355   BMP Latest Ref Rng & Units 12/23/2019 12/22/2019 12/21/2019  Glucose 70 - 99 mg/dL 127(H) 133(H) 133(H)  BUN 8 - 23 mg/dL 23 16 13   Creatinine 0.61 - 1.24 mg/dL 0.87 0.79 0.79  Sodium 135 - 145 mmol/L 138 136 139  Potassium 3.5 - 5.1 mmol/L 3.7 3.5 3.2(L)  Chloride 98 - 111 mmol/L 97(L) 98 98  CO2 22 - 32 mmol/L 31 30 28   Calcium 8.9 - 10.3 mg/dL 7.8(L) 7.9(L) 8.2(L)    Intake/Output Summary (Last 24 hours) at 12/23/2019 1213 Last data filed at 12/23/2019 1021 Gross per 24 hour  Intake 500 ml  Output 4100 ml  Net -3600 ml    Current Facility-Administered Medications:  .  acetaminophen (TYLENOL) tablet 650 mg, 650 mg, Oral, Q4H, Earlene Plater, MD, 650 mg at 12/23/19 1025 .  albuterol (PROVENTIL) (2.5 MG/3ML) 0.083% nebulizer solution 2.5 mg, 2.5 mg, Nebulization, Q4H PRN, Jennelle Human B, NP .  amLODipine (NORVASC) tablet 10 mg, 10 mg, Oral, Daily, Earlene Plater, MD, 10 mg at 12/23/19 1020 .  bisacodyl (DULCOLAX) suppository 10 mg, 10 mg, Rectal, Daily PRN, Jennelle Human B, NP .  ceFAZolin (ANCEF) IVPB 2g/100 mL premix, 2 g, Intravenous, Q8H, Bertis Ruddy, RPH, Last Rate: 200 mL/hr at 12/23/19 0750, 2 g at 12/23/19 0750 .  Chlorhexidine Gluconate Cloth 2 % PADS 6 each, 6 each, Topical,  Daily, Lucious Groves, DO, 6 each at 12/23/19 1021 .  docusate (COLACE) 50 MG/5ML liquid 100 mg, 100 mg, Oral, BID, Earlene Plater, MD, 100 mg at 12/23/19 1020 .  enoxaparin (LOVENOX) injection 40 mg, 40 mg, Subcutaneous, Q24H, Agyei, Obed K, MD, 40 mg at 12/22/19 2207 .  fentaNYL (SUBLIMAZE) injection 12.5 mcg, 12.5 mcg, Intravenous, Q2H PRN, Earlene Plater, MD .  folic acid (FOLVITE) tablet 1 mg, 1 mg, Oral, Daily, Agyei, Obed K, MD, 1 mg at 12/23/19 1020 .  gabapentin (NEURONTIN) capsule 300 mg, 300 mg, Oral, QHS, Minor, Grace Bushy, NP, 300 mg at 12/22/19 2153 .  hydrALAZINE (APRESOLINE) injection 10 mg, 10 mg, Intravenous, Q6H PRN, Margaretha Seeds, MD, 10 mg at 12/20/19 1644 .  insulin aspart (novoLOG) injection 0-5 Units, 0-5 Units, Subcutaneous, QHS, Clark, Laura P, DO .  insulin aspart (novoLOG) injection 0-9 Units, 0-9 Units, Subcutaneous, TID WC, Julian Hy, DO, 2 Units at 12/23/19 1211 .  ipratropium-albuterol (DUONEB) 0.5-2.5 (3) MG/3ML nebulizer solution 3 mL, 3 mL, Nebulization, TID, Jennelle Human B, NP, 3 mL at 12/23/19 0922 .  ketorolac (TORADOL) 30 MG/ML injection 30 mg, 30 mg, Intravenous, Q8H, Earlene Plater, MD, 30 mg at 12/23/19 1211 .  lactated ringers  infusion, , Intravenous, Continuous, Myrtie Soman, MD, New Bag at 12/18/19 1708 .  magnesium citrate solution 0.5 Bottle, 0.5 Bottle, Oral, Once PRN, Earlene Plater, MD .  MEDLINE mouth rinse, 15 mL, Mouth Rinse, BID, Ramaswamy, Murali, MD, 15 mL at 12/23/19 1021 .  Melatonin TABS 3 mg, 3 mg, Oral, QHS PRN, Brand Males, MD, 3 mg at 12/21/19 0114 .  multivitamin with minerals tablet 1 tablet, 1 tablet, Oral, Daily, Agyei, Obed K, MD, 1 tablet at 12/23/19 1020 .  oxyCODONE (Oxy IR/ROXICODONE) immediate release tablet 15 mg, 15 mg, Oral, Q4H while awake, Mannam, Praveen, MD, 15 mg at 12/23/19 1020 .  oxyCODONE (Oxy IR/ROXICODONE) immediate release tablet 5 mg, 5 mg, Oral, Q4H PRN, Julian Hy, DO, 5 mg at  12/22/19 0233 .  pantoprazole (PROTONIX) EC tablet 40 mg, 40 mg, Oral, Q1200, Joni Reining C, DO, 40 mg at 12/23/19 1025 .  polyethylene glycol (MIRALAX / GLYCOLAX) packet 17 g, 17 g, Oral, TID, Agyei, Obed K, MD, 17 g at 12/23/19 1020 .  senna (SENOKOT) tablet 8.6 mg, 1 tablet, Oral, Daily, Agyei, Obed K, MD, 8.6 mg at 12/23/19 1020 .  sodium chloride flush (NS) 0.9 % injection 10-40 mL, 10-40 mL, Intracatheter, Q12H, Hoffman, Erik C, DO, 10 mL at 12/23/19 1021 .  sodium chloride flush (NS) 0.9 % injection 10-40 mL, 10-40 mL, Intracatheter, PRN, Lucious Groves, DO .  thiamine tablet 100 mg, 100 mg, Oral, Daily, 100 mg at 12/23/19 1020 **OR** [DISCONTINUED] thiamine (B-1) injection 100 mg, 100 mg, Intravenous, Daily, Agyei, Obed K, MD, 100 mg at 12/21/19 1029   Physical Exam: AOx3, no tremors / diaphoresis Strength 5/5 in BLE, SILTx4 LUE 5/5 RUE D 5/5, B 4+/5, T 4+/5, WE 4/5, G 4-/5  Assessment & Plan: 74 y.o. man s/p evacuation of upper thoracic epidural abscess, recovering well.  -no change in neurosurgical plan of care -discussed w/ SW at bedside, he needed a DNR form signed for SNF disposition. Given I was there, I discussed at him at length in blunt terms what this meant. He was very clear about his wishes to be DNR without limitations and was able to repeat back to me what this entailed, paperwork signed. -for his pain control, I would consider increasing his gabapentin to 300tid, sounds like he takes it tid prn at home, GFR >60  [name]  12/23/19 12:13 PM

## 2019-12-23 NOTE — NC FL2 (Signed)
[place] LEVEL OF CARE SCREENING TOOL     IDENTIFICATION  Patient Name: Kent Flowers Birthdate: 07/11/1946 Sex: male Admission Date (Current Location): 12/17/2019  [facility] and [place] Number:  Herbalist and Address:  [facility] [place], [place] [address] [place], [place] [address]      Provider Number: [id number]  Attending Physician Name and Address:  Lucious Groves, DO  Relative Name and Phone Number:       Current Level of Care: Hospital Recommended Level of Care: [place] Prior Approval Number:    Date Approved/Denied:   PASRR Number: [id number]  Discharge Plan: SNF    Current Diagnoses: Patient Active Problem List   Diagnosis Date Noted  . Sepsis due to undetermined organism ([place]) 12/18/2019  . Paresthesia of right upper extremity 12/18/2019  . 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 Status post laminectomy 12/18/2019  . Insomnia 09/22/2019  . Neck pain 11/04/2017  . Other social stressor 07/19/2016  . COPD with acute bronchitis ([place]) 10/04/2014  . Hepatitis, unspecified 03/13/2014  . Chronic low back pain   . Knee pain, bilateral   . Right knee DJD   . Hepatitis 11/03/2013  . 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 Aortic calcification ([place]) 11/03/2013  . Coronary atherosclerosis of native coronary artery 11/03/2013  . Penile neoplasm   . Ascending aortic aneurysm ([place])   . Lung nodule 05/23/2012  . COPD mixed type ([place]) 02/24/2012    Orientation RESPIRATION BLADDER Height & Weight     Self, Time, Situation, Place  Normal Incontinent, Indwelling catheter Weight: 177 lb 0.5 oz (80.3 kg) Height:  5\' 8"  (172.7 cm)  BEHAVIORAL SYMPTOMS/MOOD NEUROLOGICAL BOWEL NUTRITION STATUS      Incontinent Diet(heart healthy)  AMBULATORY STATUS COMMUNICATION OF NEEDS Skin   Extensive Assist Verbally Surgical wounds(closed back incision, liquid skin adhesive)                        Personal Care Assistance Level of Assistance  Bathing, Feeding, Dressing Bathing Assistance: Maximum assistance Feeding assistance: Limited assistance Dressing Assistance: Maximum assistance     Functional Limitations Info             SPECIAL CARE FACTORS FREQUENCY  PT (By licensed PT), OT (By licensed OT)     PT Frequency: 5x/wk OT Frequency: 5x/wk            Contractures Contractures Info: Not present    Additional Factors Info  Code Status, Allergies, Insulin Sliding Scale Code Status Info: DNR Allergies Info: Ceclor (Cefaclor), Sulfa Antibiotics   Insulin Sliding Scale Info: 0-9 units 3x/day with meals; 0-5 units daily at bed       Current Medications (12/23/2019):  This is the current hospital active medication list Current Facility-Administered Medications  Medication Dose Route Frequency Provider Last Rate Last Admin  . acetaminophen (TYLENOL) tablet 650 mg  650 mg Oral Q4H Earlene Plater, MD   650 mg at 12/23/19 1345  . albuterol (PROVENTIL) (2.5 MG/3ML) 0.083% nebulizer solution 2.5 mg  2.5 mg Nebulization Q4H PRN Jennelle Human B, NP      . amLODipine (NORVASC) tablet 10 mg  10 mg Oral Daily Earlene Plater, MD   10 mg at 12/23/19 1020  . bisacodyl (DULCOLAX) suppository 10 mg  10 mg Rectal Daily PRN Jennelle Human B, NP      . ceFAZolin (ANCEF) IVPB 2g/100 mL premix  2 g Intravenous Q8H Bertis Ruddy, RPH 200 mL/hr at 12/23/19 1356 2 g at 12/23/19 1356  . Chlorhexidine Gluconate Cloth 2 % PADS 6 each  6 each Topical Daily Lucious Groves, DO   6 each at 12/23/19 1021  . docusate (COLACE) 50 MG/5ML liquid 100 mg  100 mg Oral BID Earlene Plater, MD   100 mg at 12/23/19 1020  . enoxaparin (LOVENOX) injection 40 mg  40 mg Subcutaneous Q24H Jean Rosenthal, MD   40 mg at 12/22/19 2207  . fentaNYL (SUBLIMAZE) injection 12.5 mcg  12.5 mcg Intravenous Q2H PRN Earlene Plater, MD      . folic acid (FOLVITE) tablet 1 mg  1 mg Oral Daily Agyei, Obed K, MD    1 mg at 12/23/19 1020  . gabapentin (NEURONTIN) capsule 300 mg  300 mg Oral QHS Minor, Grace Bushy, NP   300 mg at 12/22/19 2153  . hydrALAZINE (APRESOLINE) injection 10 mg  10 mg Intravenous Q6H PRN Margaretha Seeds, MD   10 mg at 12/20/19 1644  . insulin aspart (novoLOG) injection 0-5 Units  0-5 Units Subcutaneous QHS Noemi Chapel P, DO      . insulin aspart (novoLOG) injection 0-9 Units  0-9 Units Subcutaneous TID WC Julian Hy, DO   2 Units at 12/23/19 1211  . ipratropium-albuterol (DUONEB) 0.5-2.5 (3) MG/3ML nebulizer solution 3 mL  3 mL Nebulization TID Jennelle Human B, NP   3 mL at 12/23/19 1346  . ketorolac (TORADOL) 30 MG/ML injection 30 mg  30 mg Intravenous Sallye Ober, MD   30 mg at 12/23/19 1211  . lactated ringers infusion   Intravenous Continuous Myrtie Soman, MD   New Bag at 12/18/19 1708  . magnesium citrate solution 0.5 Bottle  0.5 Bottle Oral Once PRN Earlene Plater, MD      . MEDLINE mouth rinse  15 mL Mouth Rinse BID Brand Males, MD   15 mL at 12/23/19 1021  . Melatonin TABS 3 mg  3 mg Oral QHS PRN Brand Males, MD   3 mg at 12/21/19 0114  . multivitamin with minerals tablet 1 tablet  1 tablet Oral Daily Jean Rosenthal, MD   1 tablet at 12/23/19 1020  . oxyCODONE (Oxy IR/ROXICODONE) immediate release tablet 15 mg  15 mg Oral Q4H while awake Mannam, Praveen, MD   15 mg at 12/23/19 1345  . oxyCODONE (Oxy IR/ROXICODONE) immediate release tablet 5 mg  5 mg Oral Q4H PRN Noemi Chapel P, DO   5 mg at 12/22/19 0233  . pantoprazole (PROTONIX) EC tablet 40 mg  40 mg Oral Q1200 Joni Reining C, DO   40 mg at 12/23/19 1025  . polyethylene glycol (MIRALAX / GLYCOLAX) packet 17 g  17 g Oral TID Jean Rosenthal, MD   17 g at 12/23/19 1020  . senna (SENOKOT) tablet 8.6 mg  1 tablet Oral Daily Agyei, Obed K, MD   8.6 mg at 12/23/19 1020  . sodium chloride flush (NS) 0.9 % injection 10-40 mL  10-40 mL Intracatheter Q12H Hoffman, Erik C, DO   10 mL at 12/23/19 1021  .  sodium chloride flush (NS) 0.9 % injection 10-40 mL  10-40 mL Intracatheter PRN Lucious Groves, DO      . thiamine tablet 100 mg  100 mg Oral Daily Agyei, Obed K, MD   100 mg at 12/23/19 1020     Discharge Medications: Please see discharge summary for a list of  discharge medications.  Relevant Imaging Results:  Relevant Lab Results:   Additional Information SS#: [id number]  Geralynn Ochs, LCSW

## 2019-12-23 NOTE — TOC Initial Note (Addendum)
Transition of Care [facility]) - Initial/Assessment Note    Patient Details  Name: Kent Flowers MRN: [id number] Date of Birth: Apr 24, 1946  Transition of Care [facility]) CM/SW Contact:    Vinie Sill, [place] Phone Number: 12/23/2019, 2:41 PM  Clinical Narrative:                  2:20pm- spoke with the patient's daughter, Kent Flowers. She states the patient's spouse is in [facility] hospital on the 6th floor. She is in agreement with discharge plan for patient. CSW provided patient's daughter with medicare.gov website to review for choice.   11:42am- CSW  Met with the patient at bedside. CSW introduced self and explained role. CSW discussed PT recommendation of ST rehab at [facility]. Patient states he is agreeable to SNF placement. Patient states of preference. CSW explained the SNF process. Patient states no questions or concerns at this time.  Patient will need covid test when closer to discharge. CSW will provide bed offers once available.   11:40am- CSW spoke with the patient's spouse,Brooks- she was agreeable to SNF placement for patient. Patient's spouse requested CSW contact patient's daughter, Kent Flowers.  CSW called Madalynn and left voice  message top return call.  Thurmond Butts, MSW, LCSWA Clinical Social Worker    Expected Discharge Plan: Skilled Nursing Facility Barriers to Discharge: SNF Pending bed offer, Continued Medical Work up   Patient Goals and CMS Choice Patient states their goals for this hospitalization and ongoing recovery are:: - per daughter -get stronger before coming home   Choice offered to / list presented to : Adult Children(verbally gave [facility] website)  Expected Discharge Plan and Services Expected Discharge Plan: [place] In-house Referral: Clinical Social Work     Living arrangements for the past 2 months: Single Family Home                                      Prior Living Arrangements/Services Living arrangements for the past  2 months: Single Family Home Lives with:: Self, Spouse Patient language and need for interpreter reviewed:: No        Need for Family Participation in Patient Care: Yes (Comment) Care giver support system in place?: Yes (comment)   Criminal Activity/Legal Involvement Pertinent to Current Situation/Hospitalization: No - Comment as needed  Activities of Daily Living      Permission Sought/Granted Permission sought to share information with : Family Supports, Customer service manager, Case Manager Permission granted to share information with : Yes, Verbal Permission Granted  Share Information with NAME: Jaydenn Boccio  Permission granted to share info w AGENCY: SNFs  Permission granted to share info w Relationship: daughter  Permission granted to share info w Contact Information: [phone]  Emotional Assessment Appearance:: Appears stated age Attitude/Demeanor/Rapport: Engaged Affect (typically observed): Accepting, Appropriate Orientation: : Oriented to Self, Oriented to Place, Oriented to  Time, Oriented to Situation Alcohol / Substance Use: Not Applicable Psych Involvement: No (comment)  Admission diagnosis:  Sepsis due to undetermined organism ([place]) [A41.9] Sepsis without acute organ dysfunction, due to unspecified organism [facility]) [A41.9] Patient Active Problem List   Diagnosis Date Noted  . Sepsis due to undetermined organism ([place]) 12/18/2019  . Paresthesia of right upper extremity 12/18/2019  . 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 Status post laminectomy 12/18/2019  . Insomnia 09/22/2019  . Neck pain 11/04/2017  . Other social stressor 07/19/2016  .  COPD with acute bronchitis ([place]) 10/04/2014  . Hepatitis, unspecified 03/13/2014  . Chronic low back pain   . Knee pain, bilateral   . Right knee DJD   . Hepatitis 11/03/2013  . Ectatic thoracic aorta ([place]) 11/03/2013  . Aortic calcification ([place]) 11/03/2013  . Coronary atherosclerosis of  native coronary artery 11/03/2013  . Penile neoplasm   . Ascending aortic aneurysm ([place])   . Lung nodule 05/23/2012  . COPD mixed type ([place]) 02/24/2012   PCP:  Carylon Perches, NP Pharmacy:   [facility], [place] - [address] [place] [address] Phone: [phone] Fax: [phone]     Social Determinants of Health (SDOH) Interventions    Readmission Risk Interventions No flowsheet data found.

## 2019-12-23 NOTE — Progress Notes (Addendum)
RN spoke with patient's daughter, Benjamine Mola, over the phone. She is requesting that an MD call her to update her on any new test results and the patient's current status. Her contact number is [phone].   Gailen Shelter RN
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
## 2019-12-23 NOTE — Progress Notes (Signed)
Subjective:   Pt seen at the bedside this AM. He states that around 2 pm yesterday his torso began working and he is beginning to do certain activities that he wasn't able to do earlier. He states that he woke up around 4 am with "real agony, back pain."  The patient continued to perseverate on his pain medication regimen, despite [place] telling him exactly what he is getting.  Objective:  Vital signs in last 24 hours: Vitals:   12/22/19 2100 12/22/19 2300 12/23/19 0315 12/23/19 0400  BP:    106/65  Pulse:    78  Resp:    15  Temp:  100 F (37.8 C) 98.7 F (37.1 C)   TempSrc:  Oral Oral   SpO2: 96%   91%  Weight:      Height:       Physical exam General: Laying in bed and appears to be more comfort than he describes. HEENT: NCAT CV: RRR, normal S1-S2 no MRG appreciated PULM: Clear to auscultation bilaterally, no wheezes or crackles appreciated ABD: Soft and nontender in all quadrants NEURO: Moves LUE, LLE, and RLE spontaneously antigravity. 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 The patient is now s/p thoracic laminectomy for epidural abscess, POD 5. His course was complicated by prolonged extubation. He continues to have right arm weakness that was suspected to be from a right apical lung mass discovered on MRI of the right shoulder.  CT of the chest was ordered as a follow-up.  Although this mass was not commented on the CT scan, and had a conversation with radiology, who  think that this is likely not malignant and appears to be stranding related to the thoracic epidural abscess.  #Fever #Thoracic epidural abscess #MSSA bacteremia #Acute RUE weakness: POD6 for thoracic laminectomy for abscess.  MSSA bacteremia. TTE negative for endocarditis.  Do not believe TEE is necessary at this time, given the fact the patient needs 6 to 8 weeks of IV antibiotics per ID. Discovery of endocarditis would not change his management. -ID signed off, appreciate recommendations  -IV cefazolin 2 g every 8 hours for the next 6 to 8 weeks. ID to place OPAT consult.   -PICC line in place  -F/u with ID in 4 weeks  -F/u repeat blood cultures (NGTD)  -PT/OT recommend SNF  #Acute on chronic back pain: Patient has a history of degenerative spine disease and is on chronic opiates at home.  Patient reports that his pain is not controlled, and perseverates on his pain medication regimen despite [place] addressing it and telling him that we would make some adjustments.  Patient was saturating at 90% on room air during her visit and was breathing slowly.  I do not want to increase his opioid regimen at this time. -Toradol 30 mg every 8 hours scheduled -Fentanyl  12.79mcg q2hrs PRN, will DC tomorrow -Oxycodone IR 15 mg every 4 hours scheduled -Oxycodone IR 5 mg every 4 hours as needed for breakthrough pain -Gabapentin 300 mg nightly  #  R apical mass: Incidentally found on MRI of the right shoulder. -Follow-up CT chest did not show apical mass, but would was there appeared to be residual stranding from the epidural abscess, does not appear to be malignant. -We will myology to follow-up with a PET scan in 2 weeks post discharge.  #Consptiation -MiraLAX 17g twice daily -Senokot tablet daily -Colace 100 mg twice daily -Mag citrate half bottle as needed ordered -Dulcolax 10 mg suppository daily as needed  #Alcohol use: Patient stated he drinks 3-4 drinks every other day. -Folic acid 1 mg  daily -Thiamine 100 mg daily -Multivitamin with minerals 1 tablet daily  #Hx COPD -Albuterol 2 puffs every 6 hours as needed -DuoNeb 3 times daily  #FEN/GI -Diet: Heart healthy -Fluids: LR 10 cc/hour  #DVT prophylaxis -Lovenox 40 mg subq injections daily  #CODE STATUS: DNR  #Dispo: Pending medical course. PT/OT recommending SNF placement.   Earlene Plater, MD Internal Medicine, PGY1 Pager: [phone]  12/23/2019,11:57 AM

## 2019-12-23 NOTE — Progress Notes (Signed)
Patents daughter Kent Flowers would like to talk with doctor to get an update.

## 2019-12-23 NOTE — Progress Notes (Signed)
Pharmacy Antibiotic Note  Kent Flowers is a 74 y.o. male admitted on 12/17/2019 with staph aureus bacteremia + epidural abscess started on vancomycin.  BCx now back with MSSA and pharmacy has been consulted for cefazolin dosing.   SCr 0.87, CrCl ~ 74 ml/min remains stable with good UOP, ID recs 6 weeks through 2/10, OPAT orders in 1/4  Plan: Continue cefazolin 2g IV every 8 hours Rx will sign off  Height: 5\' 8"  (172.7 cm) Weight: 177 lb 0.5 oz (80.3 kg) IBW/kg (Calculated) : 68.4  Temp (24hrs), Avg:98.7 F (37.1 C), Min:98.3 F (36.8 C), Max:100 F (37.8 C)  Recent Labs  Lab 12/18/19 0034 12/18/19 0222 12/19/19 0408 12/20/19 0526 12/20/19 1144 12/21/19 0515 12/22/19 0227 12/23/19 0719  WBC 22.8*  --  20.7*  --  20.7* 24.0* 24.9* 21.9*  CREATININE 1.04  --  0.80 0.66 0.80 0.79 0.79 0.87  LATICACIDVEN 1.4 1.1  --   --   --   --   --   --     Estimated Creatinine Clearance: 74.3 mL/min (by C-G formula based on SCr of 0.87 mg/dL).    Allergies  Allergen Reactions  . Ceclor [Cefaclor] Rash  . Sulfa Antibiotics Rash and Other (See Comments)    SEVERE RASH- childhood allergy    Bertis Ruddy, PharmD Clinical Pharmacist Please check AMION for all [place] numbers 12/23/2019 9:22 AM

## 2019-12-24 LAB — CULTURE, BLOOD (ROUTINE X 2)
Culture: NO GROWTH
Special Requests: ADEQUATE

## 2019-12-24 LAB — CBC
HCT: 39 % (ref 39.0–52.0)
Hemoglobin: 12.2 g/dL — ABNORMAL LOW (ref 13.0–17.0)
MCH: 31 pg (ref 26.0–34.0)
MCHC: 31.3 g/dL (ref 30.0–36.0)
MCV: 99.2 fL (ref 80.0–100.0)
Platelets: 523 10*3/uL — ABNORMAL HIGH (ref 150–400)
RBC: 3.93 MIL/uL — ABNORMAL LOW (ref 4.22–5.81)
RDW: 13.4 % (ref 11.5–15.5)
WBC: 19.4 10*3/uL — ABNORMAL HIGH (ref 4.0–10.5)
nRBC: 0 % (ref 0.0–0.2)

## 2019-12-24 LAB — BASIC METABOLIC PANEL
Anion gap: 9 (ref 5–15)
BUN: 19 mg/dL (ref 8–23)
CO2: 31 mmol/L (ref 22–32)
Calcium: 7.8 mg/dL — ABNORMAL LOW (ref 8.9–10.3)
Chloride: 95 mmol/L — ABNORMAL LOW (ref 98–111)
Creatinine, Ser: 0.67 mg/dL (ref 0.61–1.24)
GFR calc Af Amer: >60 mL/min (ref >60)
GFR calc non Af Amer: >60 mL/min (ref >60)
Glucose, Bld: 107 mg/dL — ABNORMAL HIGH (ref 70–99)
Potassium: 3.9 mmol/L (ref 3.5–5.1)
Sodium: 135 mmol/L (ref 135–145)

## 2019-12-24 LAB — URINE CULTURE: Culture: NO GROWTH

## 2019-12-24 LAB — SARS CORONAVIRUS 2 (TAT 6-24 HRS): SARS Coronavirus 2: NEGATIVE

## 2019-12-24 LAB — GLUCOSE, CAPILLARY
Glucose-Capillary: 157 mg/dL — ABNORMAL HIGH (ref 70–99)
Glucose-Capillary: 104 mg/dL — ABNORMAL HIGH (ref 70–99)
Glucose-Capillary: 128 mg/dL — ABNORMAL HIGH (ref 70–99)
Glucose-Capillary: 109 mg/dL — ABNORMAL HIGH (ref 70–99)

## 2019-12-24 MED ORDER — MAGNESIUM CITRATE PO SOLN
0.5000 | Freq: Once | ORAL | Status: AC
  Administered 2019-12-24: 0.5 via ORAL
  Filled 2019-12-24: qty 296

## 2019-12-24 MED ORDER — OXYCODONE HCL 5 MG PO TABS
10.0000 mg | ORAL_TABLET | ORAL | Status: DC
  Administered 2019-12-24 – 2019-12-25 (×6): 10 mg via ORAL
  Filled 2019-12-24 (×6): qty 2

## 2019-12-24 MED ORDER — GABAPENTIN 300 MG PO CAPS
300.0000 mg | ORAL_CAPSULE | Freq: Three times a day (TID) | ORAL | Status: DC
  Administered 2019-12-24 – 2019-12-25 (×4): 300 mg via ORAL
  Filled 2019-12-24 (×4): qty 1

## 2019-12-24 NOTE — TOC Progression Note (Signed)
Transition of Care [facility]) - Progression Note    Patient Details  Name: Kent Flowers MRN: [id number] Date of Birth: 14-Oct-1946  Transition of Care [facility]) CM/SW [place], [place] Phone Number: 12/24/2019, 11:43 AM  Clinical Narrative:     CSW spoke with patient's daughter, Sandi Raveling- provided bed offers. She will review and contact CSW with choice.  Thurmond Butts, MSW, [place] Clinical Social Worker    Expected Discharge Plan: Skilled Nursing Facility Barriers to Discharge: SNF Pending bed offer, Continued Medical Work up  Expected Discharge Plan and Services Expected Discharge Plan: [place] In-house Referral: Clinical Social Work     Living arrangements for the past 2 months: Single Family Home                                       Social Determinants of Health (SDOH) Interventions    Readmission Risk Interventions No flowsheet data found.

## 2019-12-24 NOTE — Progress Notes (Addendum)
Subjective:   Pt seen at the bedside this AM. Patient states he is feeling better this morning. He is looking forward to going to rehab. The patient is asking that his oxycodone be decreased to his home 10 mg every 4 hours. Pt states he has not had a bowel movement yet. He also request that we call his daughter for an update.  Objective:  Vital signs in last 24 hours: Vitals:   12/23/19 2356 12/24/19 0344 12/24/19 0801 12/24/19 0805  BP: 108/61 138/77  (!) 138/56  Pulse: 72 75  80  Resp:    14  Temp: 98.5 F (36.9 C) 98.3 F (36.8 C)  (!) 97.3 F (36.3 C)  TempSrc: Oral Oral  Axillary  SpO2:   95% 98%  Weight:      Height:       Physical exam General: Laying in bed, appears comfortable.  NAD HEENT: NCAT, EOMI CV: RRR, normal S1-S2 no MRG appreciated PULM: Clear to auscultation bilaterally, no wheezes or crackles appreciated ABD: Soft and nontender in all quadrants NEURO: Moves LUE, LLE, and RLE spontaneously antigravity. 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 The patient is now s/p thoracic laminectomy for epidural abscess on 12/31. His course was complicated by prolonged extubation. He continues to have right arm weakness that was suspected to be from a right apical lung mass discovered on MRI of the right shoulder.  CT of the chest was ordered as a follow-up.  Although this mass was not commented on the CT scan, and had a conversation with radiology, who think that  this is likely not malignant and appears to be stranding related to the thoracic epidural abscess.  #Fever #Thoracic epidural abscess #MSSA bacteremia #Acute RUE weakness: s/p thoracic laminectomy for abscess 12/31.  MSSA bacteremia. TTE negative for endocarditis.  Do not believe TEE is necessary at this time, given the fact the patient needs 6 to 8 weeks of IV antibiotics per ID. Discovery of endocarditis would not change his management. -ID signed off, appreciate recommendations  -IV cefazolin 2 g every 8 hours for the next 6 to 8 weeks. ID to place OPAT consult.   -PICC line in place  -F/u with ID in 4 weeks  -F/u repeat blood cultures (NGTD)  -PT/OT recommend SNF  #Acute on chronic back pain: Patient has a history of degenerative spine disease and is on chronic opiates at home.  Patient frequently perseverates on his pain medication regimen despite [place] addressing it and telling him that we would make some adjustments. Patient was saturating at 94% on room air during our visit. -D/c Fentanyl  -Decrease to Oxycodone IR 10 mg every 4 hours scheduled -Increase Gabapentin 300 mg TID -Oxycodone IR 5 mg every 4 hours as needed for breakthrough pain -Toradol 30 mg every 8 hours scheduled -Tylenol 650 mg q6hrs scheduled    #R apical mass: Incidentally found on MRI of the right shoulder. -Follow-up CT chest did not visualize right apical mass well. Pulmonology planning on outpatient  PET scan, however after speaking with radiology they believe it would be beneficial to obtain a contrasted CT of the area in 3 weeks as opposed to a PET scan.  #Consptiation -Mag citrate half bottle once ordered -MiraLAX 17g twice daily -Senokot tablet daily -Colace 100 mg twice daily -Dulcolax 10 mg suppository daily as needed  #Alcohol use: Patient stated he drinks 3-4 drinks every other day. -Folic acid 1 mg daily -Thiamine 100 mg daily -Multivitamin with minerals 1 tablet daily  #Hx COPD -Albuterol 2  puffs every 6 hours as needed -DuoNeb 3 times daily -Goal O2 saturations 88-92% currently on room air  #FEN/GI -Diet: Heart healthy -Fluids: LR 10 cc/hour  #DVT prophylaxis -Lovenox 40 mg subq injections daily  #CODE STATUS: DNR  #Dispo: Pending medical course. PT/OT recommending SNF placement. Will follow up with CSW.  Earlene Plater, MD Internal Medicine, PGY1 Pager: [phone]  12/24/2019,10:04 AM

## 2019-12-24 NOTE — TOC Progression Note (Signed)
Transition of Care [facility]) - Progression Note    Patient Details  Name: BRIK BRADFORD MRN: [id number] Date of Birth: Jul 29, 1946  Transition of Care [facility]) CM/SW [place], [place] Phone Number: 12/24/2019, 4:51 PM  Clinical Narrative:     Family has selected [facility] as second choice. CSW contacted [facility] to confirm bed offer and availability. CSW waiting on response.  CSW informed RN - needs covid   Thurmond Butts, MSW, [place] Clinical Social Worker   Expected Discharge Plan: Skilled Nursing Facility Barriers to Discharge: SNF Pending bed offer, Continued Medical Work up  Expected Discharge Plan and Services Expected Discharge Plan: [place] In-house Referral: Clinical Social Work     Living arrangements for the past 2 months: Single Family Home                                       Social Determinants of Health (SDOH) Interventions    Readmission Risk Interventions No flowsheet data found.

## 2019-12-24 NOTE — Care Management Important Message (Signed)
Important Message  Patient Details  Name: Kent Flowers MRN: [id number] Date of Birth: 09-20-1946   Medicare Important Message Given:  Yes     Orbie Pyo 12/24/2019, 2:09 PM
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
## 2019-12-25 DIAGNOSIS — G062 Extradural and subdural abscess, unspecified: Secondary | ICD-10-CM | POA: Diagnosis not present

## 2019-12-25 DIAGNOSIS — R29898 Other symptoms and signs involving the musculoskeletal system: Secondary | ICD-10-CM | POA: Diagnosis not present

## 2019-12-25 DIAGNOSIS — J449 Chronic obstructive pulmonary disease, unspecified: Secondary | ICD-10-CM | POA: Diagnosis not present

## 2019-12-25 DIAGNOSIS — R5381 Other malaise: Secondary | ICD-10-CM | POA: Diagnosis not present

## 2019-12-25 DIAGNOSIS — M6281 Muscle weakness (generalized): Secondary | ICD-10-CM | POA: Diagnosis not present

## 2019-12-25 DIAGNOSIS — R279 Unspecified lack of coordination: Secondary | ICD-10-CM | POA: Diagnosis not present

## 2019-12-25 DIAGNOSIS — Z9889 Other specified postprocedural states: Secondary | ICD-10-CM | POA: Diagnosis not present

## 2019-12-25 DIAGNOSIS — G8929 Other chronic pain: Secondary | ICD-10-CM | POA: Diagnosis not present

## 2019-12-25 DIAGNOSIS — Z743 Need for continuous supervision: Secondary | ICD-10-CM | POA: Diagnosis not present

## 2019-12-25 DIAGNOSIS — M545 Low back pain: Secondary | ICD-10-CM | POA: Diagnosis not present

## 2019-12-25 DIAGNOSIS — M170 Bilateral primary osteoarthritis of knee: Secondary | ICD-10-CM | POA: Diagnosis not present

## 2019-12-25 DIAGNOSIS — R2689 Other abnormalities of gait and mobility: Secondary | ICD-10-CM | POA: Diagnosis not present

## 2019-12-25 DIAGNOSIS — A4901 Methicillin susceptible Staphylococcus aureus infection, unspecified site: Secondary | ICD-10-CM | POA: Diagnosis not present

## 2019-12-25 DIAGNOSIS — R7881 Bacteremia: Secondary | ICD-10-CM | POA: Diagnosis not present

## 2019-12-25 DIAGNOSIS — R600 Localized edema: Secondary | ICD-10-CM | POA: Diagnosis not present

## 2019-12-25 DIAGNOSIS — G061 Intraspinal abscess and granuloma: Secondary | ICD-10-CM | POA: Diagnosis not present

## 2019-12-25 DIAGNOSIS — R918 Other nonspecific abnormal finding of lung field: Secondary | ICD-10-CM | POA: Diagnosis not present

## 2019-12-25 DIAGNOSIS — R202 Paresthesia of skin: Secondary | ICD-10-CM | POA: Diagnosis not present

## 2019-12-25 LAB — CBC
HCT: 38.2 % — ABNORMAL LOW (ref 39.0–52.0)
Hemoglobin: 12.1 g/dL — ABNORMAL LOW (ref 13.0–17.0)
MCH: 31 pg (ref 26.0–34.0)
MCHC: 31.7 g/dL (ref 30.0–36.0)
MCV: 97.9 fL (ref 80.0–100.0)
Platelets: 578 10*3/uL — ABNORMAL HIGH (ref 150–400)
RBC: 3.9 MIL/uL — ABNORMAL LOW (ref 4.22–5.81)
RDW: 13.3 % (ref 11.5–15.5)
WBC: 18.8 10*3/uL — ABNORMAL HIGH (ref 4.0–10.5)
nRBC: 0 % (ref 0.0–0.2)

## 2019-12-25 LAB — BASIC METABOLIC PANEL
Anion gap: 9 (ref 5–15)
BUN: 18 mg/dL (ref 8–23)
CO2: 28 mmol/L (ref 22–32)
Calcium: 7.8 mg/dL — ABNORMAL LOW (ref 8.9–10.3)
Chloride: 99 mmol/L (ref 98–111)
Creatinine, Ser: 0.66 mg/dL (ref 0.61–1.24)
GFR calc Af Amer: >60 mL/min (ref >60)
GFR calc non Af Amer: >60 mL/min (ref >60)
Glucose, Bld: 105 mg/dL — ABNORMAL HIGH (ref 70–99)
Potassium: 4.1 mmol/L (ref 3.5–5.1)
Sodium: 136 mmol/L (ref 135–145)

## 2019-12-25 LAB — GLUCOSE, CAPILLARY
Glucose-Capillary: 126 mg/dL — ABNORMAL HIGH (ref 70–99)
Glucose-Capillary: 125 mg/dL — ABNORMAL HIGH (ref 70–99)

## 2019-12-25 MED ORDER — CEFAZOLIN IV (FOR PTA / DISCHARGE USE ONLY): 2.0000 g | Freq: Three times a day (TID) | INTRAVENOUS | 0 refills | Status: AC

## 2019-12-25 MED ORDER — OXYCODONE HCL 10 MG PO TABS: 10.0000 mg | ORAL_TABLET | ORAL | 0 refills | Status: DC

## 2019-12-25 MED ORDER — THIAMINE HCL 100 MG PO TABS: 100.0000 mg | ORAL_TABLET | Freq: Every day | ORAL | Status: DC

## 2019-12-25 MED ORDER — ADULT MULTIVITAMIN W/MINERALS CH: 1.0000 | ORAL_TABLET | Freq: Every day | ORAL | Status: DC

## 2019-12-25 MED ORDER — OXYCODONE HCL 5 MG PO TABS: 5.0000 mg | ORAL_TABLET | ORAL | 0 refills | Status: DC | PRN

## 2019-12-25 MED ORDER — FOLIC ACID 1 MG PO TABS: 1.0000 mg | ORAL_TABLET | Freq: Every day | ORAL | Status: DC

## 2019-12-25 MED ORDER — GABAPENTIN 300 MG PO CAPS: 300.0000 mg | ORAL_CAPSULE | Freq: Three times a day (TID) | ORAL | Status: DC

## 2019-12-25 MED ORDER — SENNA 8.6 MG PO TABS: 1.0000 | ORAL_TABLET | Freq: Every day | ORAL | 0 refills | Status: DC

## 2019-12-25 MED ORDER — CEFAZOLIN SODIUM-DEXTROSE 2-4 GM/100ML-% IV SOLN: 2.0000 g | Freq: Three times a day (TID) | INTRAVENOUS | Status: DC

## 2019-12-25 MED ORDER — DOCUSATE SODIUM 50 MG/5ML PO LIQD: 100.0000 mg | Freq: Two times a day (BID) | ORAL | 0 refills | Status: DC

## 2019-12-25 MED ORDER — POLYETHYLENE GLYCOL 3350 17 G PO PACK: 17.0000 g | PACK | Freq: Three times a day (TID) | ORAL | 0 refills | Status: DC

## 2019-12-25 MED ORDER — AMLODIPINE BESYLATE 10 MG PO TABS: 10.0000 mg | ORAL_TABLET | Freq: Every day | ORAL | Status: DC

## 2019-12-25 MED ORDER — PANTOPRAZOLE SODIUM 40 MG PO TBEC: 40.0000 mg | DELAYED_RELEASE_TABLET | Freq: Every day | ORAL | Status: DC

## 2019-12-25 NOTE — Discharge Instructions (Addendum)
Neurosurgery Discharge Instructions  No restriction in activities, slowly increase your activity back to normal.   Your incision is closed with dermabond (purple glue). This will naturally fall off over the next 1-2 weeks. There are clear sutures that you will then be able to see. These are absorbable, meaning they do not have to be removed or cut out, they will naturally fall off about 4-6 weeks after surgery.  Okay to shower on the day of discharge. Use regular soap and water and try to be gentle when cleaning your incision.   Follow up with Dr. Zada Finders in 2-3 weeks after discharge. If you do not already have a discharge appointment, please call his office at [phone] to schedule a follow up appointment. If you have any concerns or questions, please call the office and let us know.
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
## 2019-12-25 NOTE — TOC Transition Note (Signed)
Transition of Care [facility]) - CM/SW Discharge Note   Patient Details  Name: NATRELL KOSS MRN: [id number] Date of Birth: February 27, 1946  [facility]) CM/SW Contact:  Vinie Sill, [place] Phone Number: 12/25/2019, 1:13 PM   Clinical Narrative:     Patient will DC to: [place] Date: 12/25/2019 Family Notified: Elizabeth,daughter Transport By: Corey Harold (next available)  RN, patient, and facility notified of DC. Discharge Summary sent to facility. RN given number for [phone], Room 119A. Ambulance transport requested for patient.   Clinical Social Worker signing off. Thurmond Butts, MSW, [place] Clinical Social Worker    Final next level of care: Skilled Nursing Facility Barriers to Discharge: Barriers Resolved   Patient Goals and CMS Choice Patient states their goals for this hospitalization and ongoing recovery are:: - per daughter -get stronger before coming home   Choice offered to / list presented to : Adult Children(verbally gave [facility] website)  Discharge Placement PASRR number recieved: 12/23/19            Patient chooses bed at: [facility] Patient to be transferred to facility by: [place] Name of family member notified: Benjamine Mola, daughter Patient and family notified of of transfer: 12/25/19  Discharge Plan and Services In-house Referral: Clinical Social Work                                   Social Determinants of Health ([place]) Interventions     Readmission Risk Interventions No flowsheet data found.

## 2019-12-25 NOTE — Progress Notes (Signed)
   12/25/19 1430  Hand-Off documentation  Handoff Given Given to Transfer Unit/facility [facility])  Report given to (Full Name) [facility] Received Received from shift RN/LPN  Report received from (Full Name) Ewing Schlein   Received discharge order for patient. Discharge instructions placed in packet given to transport. Pt stable in no acute distress. PIV removed, intact. Right upper arm PICC remains intact and secured. Report of pt urinary issues, bladder scan, and I&O cath told to [facility] at [facility]. Pt off unit in stretcher. Nursing care complete.

## 2019-12-25 NOTE — Plan of Care (Signed)
  Problem: Elimination: Goal: Will not experience complications related to urinary retention Note: Foley catheter removed per MD order. Catheter ballon deflated. Catheter removed without difficulty; catheter tip intact. Patient instructed to notify nurse of first void. Patient tolerated well. Will continue to monitor urinary output.

## 2019-12-25 NOTE — Progress Notes (Signed)
Subjective:   Pt seen at the bedside this AM.  Patient states his nurses did not give him his pain medications at the right time last night, and also requests that we go up on his opioid medications.  I reassured the patient he was getting his pain medicines as prescribed, and we would not be going up on his opioid medication. I also explained that he should be going to the nursing facility soon to continue to get rehab.  Patient is looking forward to this and is looking forward to rehab.  He request that we call his daughter for an update.  Objective:  Vital signs in last 24 hours: Vitals:   12/25/19 0423 12/25/19 0732 12/25/19 0824 12/25/19 1255  BP:  (!) 145/76  (!) 144/60  Pulse:  77  72  Resp:  18  17  Temp: 99.3 F (37.4 C) 98 F (36.7 C)  97.8 F (36.6 C)  TempSrc: Oral Oral  Oral  SpO2:  91% 95% 94%  Weight:      Height:       Physical exam General: Laying in bed, appears comfortable. NAD HEENT: NCAT, EOMI CV: RRR, normal S1-S2 no MRG appreciated PULM: Clear to auscultation bilaterally, no wheezes or crackles appreciated ABD: Soft and nontender in all quadrants NEURO: Moves LUE, LLE, and RLE spontaneously antigravity. 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 The patient is now s/p thoracic laminectomy for epidural abscess on 12/31. His course was complicated by prolonged extubation. He continues to have right arm weakness that was suspected to be from a  right apical lung mass discovered on MRI of the right shoulder.  CT of the chest was ordered as a follow-up.  Although this mass was not commented on the CT scan, and had a conversation with radiology, who think that this is likely not malignant and appears to be stranding related to the thoracic epidural abscess.  At this point, the patient is medically stable and ready for discharge.  #Fever #Thoracic epidural abscess #MSSA bacteremia #Acute RUE weakness: s/p thoracic laminectomy for abscess 12/31.  MSSA bacteremia. TTE negative for endocarditis.  Do not believe TEE is necessary at this time, given the fact the patient needs 6 to 8 weeks of IV antibiotics per ID. Discovery of endocarditis would not change his management. -ID signed off, appreciate recommendations  -IV cefazolin 2 g every 8 hours for the next 6 to 8 weeks. ID to place OPAT consult.   -PICC line in place  -F/u with ID in 4 weeks  -F/u repeat blood cultures (NGTD)  -Neurosurgery signed off, appreciate reccomendations  -F/u with Dr. Zada Finders in 2-3 weeks -Discharge to SNF today  #Acute on chronic back pain: Patient has a history of degenerative spine disease and is on chronic opiates at home.  Patient frequently perseverates on his pain medication regimen despite [place] addressing it and telling him that we would make some adjustments. Patient was saturating at 94% on room air during our visit. -  Oxycodone IR 10 mg every 4 hours scheduled -Oxycodone IR 5 mg every 4 hours as needed for breakthrough pain -Gabapentin 300 mg TID -Toradol 30 mg every 8 hours scheduled -Tylenol 650 mg q6hrs scheduled   #R apical mass: Incidentally found on MRI of the right shoulder. -Follow-up CT chest did not visualize right apical mass well. Pulmonology planning on outpatient PET scan, however after speaking with radiology they believe it would be beneficial to obtain a contrasted CT of the area in 3 weeks as opposed to a PET scan.  I will reflect this  in the discharge summary. -F/u appointment with with Dr. Annamaria Boots on 09/21/2020  #Consptiation -Mag citrate PRN -MiraLAX 17g twice daily -Senokot tablet daily -Colace 100 mg twice daily -Dulcolax 10 mg suppository daily as needed  #Alcohol use: Patient stated he drinks 3-4 drinks every other day. -Folic acid 1 mg daily -Thiamine 100 mg daily -Multivitamin with minerals 1 tablet daily  #Hx COPD -Albuterol 2 puffs every 6 hours as needed -DuoNeb 3 times daily -Goal O2 saturations 88-92% currently on room air  #FEN/GI -Diet: Heart healthy -Fluids: LR 10 cc/hour  #DVT prophylaxis -Lovenox 40 mg subq injections daily  #CODE STATUS: DNR  #Dispo: Discharge to SNF today.  Earlene Plater, MD Internal Medicine, PGY1 Pager: [phone]  12/25/2019,2:51 PM

## 2019-12-25 NOTE — Discharge Summary (Addendum)
Name: Kent Flowers MRN: [id number] DOB: 04-06-1946 74 y.o. PCP: Carylon Perches, NP  Date of Admission: 12/17/2019 11:53 PM Date of Discharge: 12/24/2018 Attending Physician: Lucious Groves, DO  Discharge Diagnosis: 1.  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  Right hand weakness 3.  Right apical lung mass  Discharge Medications: Allergies as of 12/25/2019      Reactions   Ceclor [cefaclor] Rash   Sulfa Antibiotics Rash, Other (See Comments)   SEVERE RASH- childhood allergy      Medication List    TAKE these medications   albuterol 108 (90 Base) MCG/ACT inhaler Commonly known as: ProAir HFA INHALE 2 PUFFS INTO THE LUNGS EVERY 6 HOURS AS NEEDED FOR WHEEZING ORSHORTNESS OF BREATH What changed:   how much to take  how to take this  when to take this  reasons to take this  additional instructions   amLODipine 10 MG tablet Commonly known as: NORVASC Take 1 tablet (10 mg total) by mouth daily. Start taking on: December 26, 2019   ceFAZolin  IVPB Commonly known as: ANCEF Inject 2 g into the vein every 8 (eight) hours. Indication:  MSSA bacteremia Last Day of Therapy:  01/28/2020 Labs - Once weekly:  CBC/D and BMP, Labs - Every other week:  ESR and CRP   ceFAZolin 2-4 GM/100ML-% IVPB Commonly known as: ANCEF Inject 100 mLs (2 g total) into the vein every 8 (eight) hours.   diclofenac 50 MG EC tablet Commonly known as: VOLTAREN Take 50 mg by mouth daily.   docusate 50 MG/5ML liquid Commonly known as: COLACE Take 10 mLs (100 mg total) by mouth 2 (two) times daily.   folic acid 1 MG tablet Commonly known as: FOLVITE Take 1 tablet (1 mg total) by mouth daily. Start taking on: December 26, 2019   gabapentin 300 MG capsule Commonly known as: NEURONTIN Take 1 capsule (300 mg total) by mouth 3 (three) times daily. What changed:   medication strength  when to take this   lidocaine 5 % Commonly known as: Lidoderm Place 1 patch onto the skin  daily. Remove & Discard patch within 12 hours or as directed by MD   Melatonin 5 MG Tabs Take 5 mg by mouth at bedtime as needed (sleep).   multivitamin with minerals Tabs tablet Take 1 tablet by mouth daily. Start taking on: December 26, 2019   Oxycodone HCl 10 MG Tabs Take 10 mg by mouth every 4 (four) hours as needed (pain). What changed: Another medication with the same name was added. Make sure you understand how and when to take each.   oxyCODONE 5 MG immediate release tablet Commonly known as: Oxy IR/ROXICODONE Take 1 tablet (5 mg total) by mouth every 4 (four) hours as needed for moderate pain or severe pain. What changed: You were already taking a medication with the same name, and this prescription was added. Make sure you understand how and when to take each.   pantoprazole 40 MG tablet Commonly known as: PROTONIX Take 1 tablet (40 mg total) by mouth daily at 12 noon.   polyethylene glycol 17 g packet Commonly known as: MIRALAX / GLYCOLAX Take 17 g by mouth 3 (three) times daily.   senna 8.6 MG Tabs tablet Commonly known as: SENOKOT Take 1 tablet (8.6 mg total) by mouth daily. Start taking on: December 26, 2019   testosterone cypionate 200 MG/ML injection Commonly known as: DEPOTESTOSTERONE CYPIONATE Inject 180 mg into the muscle every 14 (  fourteen) days.   thiamine 100 MG tablet Take 1 tablet (100 mg total) by mouth daily. Start taking on: December 26, 2019            Home Infusion Instuctions  (From admission, onward)         Start     Ordered   12/25/19 0000  Home infusion instructions    Question:  Instructions  Answer:  Flushing of vascular access device: 0.9% NaCl pre/post medication administration and prn patency; Heparin 100 u/ml, 72m for implanted ports and Heparin 10u/ml, 517mfor all other central venous catheters.   12/25/19 1021           Durable Medical Equipment  (From admission, onward)         Start     Ordered   12/25/19 1015  DME  3-in-1  Once     12/25/19 1021   12/25/19 1014  DME standard manual wheelchair with seat cushion  Once    Comments: Patient suffers from deconditioning and chronic pain which impairs their ability to perform daily activities like walking, cooking, or cleaning in the home.  A rolling walker will not be sufficient enough to resolve issue with performing activities of daily living. A wheelchair will allow patient to safely perform daily activities. Patient can safely propel the wheelchair in the home or has a caregiver who can provide assistance. Length of need 4 weeks Accessories: elevating leg rests (ELRs), wheel locks, extensions and anti-tippers.   12/25/19 1021   12/25/19 1013  DME Walker  Once    Question Answer Comment  Walker: With 5 Inch Wheels   Patient needs a walker to treat with the following condition Weakness      12/25/19 1021          Disposition and follow-up:   [facility] At the hospital follow up visit please address:  1.  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  Plan for 6 weeks of IV cefazolin via PICC line.  F/U with ID with Dr. CoLinus Salmonsn 2/3.  -Please ensure follow up with 2-3 weeks with Dr. OsZada Findersf Neurosurgery  -Please order weekly CRP and ESR (per Infectious Disease recommendations) -Pain regimen the day before discharge:  -Oxycodone 10 mg q4hrs scheduled (this was continued)  -Oxycodone 5 mg q4hrs PRN for breakthrough pain (this was continued)  -Gabapentin 300 mg TID (this was continued)  -Toradol 30 mg injections every 8 hours scheduled (this was d/c on discharge) -Pt tends to perseverate on his pain medication regimen despite [place] over it multiple times  2.  Right hand weakness: Likley secondary to right lung apical mass. -Pt discharged to SNF for rehab   3.  Right apical lung mass:  Incidentally discovered on MRI of right shoulder.  The read on the follow-up CT scan did not directly comment on the mass, however after speaking with the radiologist, they believe that is likely stranding related to the thoracic epidural abscess and recommended follow-up CT in 3 weeks.  Dr YoAnnamaria Bootsf Pulmonology to follow-up in the outpatient setting on 09/21/20. -Consider obtaining repeat PET scan or CT scan in 3 weeks -Coordinate with pulmonology about follow-up imaging  4.  Labs / imaging needed at time of follow-up: CBC, BMP  5.  Pending labs/ test needing follow-up: none  Follow-up Appointments:   1. Infectious Disease Dr. RoScharlene Gloss  01/21/2020 3:45 PM  [place] for Infectious Disease   2. Pulmonology Dr. Baird Lyons  09/21/2020 11:00 AM [place]  3. Please schedule a follow up appointment with Dr. Zada Finders in 2-3 weeks.  Hospital Course by problem list: 1.  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  Right hand weakness 3.  Right apical lung mass  In summary,Mr. 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 Patient had a CT angio of the chest that was negative for PE, but significant for upper thoracic paravertebral stranding, concerning for spinal infection.  Patient was initially started on IV vancomycin then underwent follow-up MRI was obtained and showed an epidural fluid collection from T1-T3-T4 causing moderate canal stenosis. Blood cultures grew MSSA.  Pt underwent thoracic laminectomy for epidural abscess on 12/31 with neurosurgery and was started on cefazolin at the direction of infectious disease. TTE negative for endocarditis.  Infectious disease recommended 6 weeks of IV antibiotics (stop  date February 10th).  His course was complicated by prolonged extubation (3 days) that was managed by critical care. The patient continued to have right arm weakness that was suspected to be from a right apical lung mass discovered on MRI of the right shoulder. CT of the chest was ordered as a follow-up. Although the official read on the repeat CT did not comment on the mass, the team talked with radiology. They think this is likely not a malignant mass and appears to be stranding related to the thoracic epidural abscess. They recommend follow-up CT scan in 3 weeks to reassess the lesion. However currently pulmonology currently has plans to perform a PET scan in 2 weeks. -Pt has f/u with ID on 01/22/20 -Pt has f/u witih Pulmonology on 10/11/20 -Pt needs a follow up appointment with Neurosurgery in 2-3 weeks  Discharge Vitals:   BP (!) 145/76 (BP Location: Left Wrist)   Pulse 77   Temp 98 F (36.7 C) (Oral)   Resp 18   Ht _0  (1.727 m)   Wt 80.3 kg   SpO2 95%   BMI 26.92 kg/m   Pertinent Labs, Studies, and Procedures:  CBC Latest Ref Rng & Units 12/25/2019 12/24/2019 12/23/2019  WBC 4.0 - 10.5 K/uL 18.8(H) 19.4(H) 21.9(H)  Hemoglobin 13.0 - 17.0 g/dL 12.1(L) 12.2(L) 12.5(L)  Hematocrit 39.0 - 52.0 % 38.2(L) 39.0 38.1(L)  Platelets 150 - 400 K/uL 578(H) 523(H) 435(H)   BMP Latest Ref Rng & Units 12/25/2019 12/24/2019 12/23/2019  Glucose 70 - 99 mg/dL 105(H) 107(H) 127(H)  BUN 8 - 23 mg/dL _1 Creatinine 0.61 - 1.24 mg/dL 0.66 0.67 0.87  Sodium 135 - 145 mmol/L 136 135 138  Potassium 3.5 - 5.1 mmol/L 4.1 3.9 3.7  Chloride 98 - 111 mmol/L 99 95(L) 97(L)  CO2 22 - 32 mmol/L _2 Calcium 8.9 - 10.3 mg/dL 7.8(L) 7.8(L) 7.8(L)   CT Angio PE 12/18/2019 IMPRESSION: 1. Upper thoracic paravertebral stranding also involving the right-sided thoracic inlet. No underlying fracture or erosion is seen. Given there is both history of recent trauma and fever would suggest MRI to evaluate for occult  injury or spinal infection. 2. Negative for pulmonary embolism. 3. Aortic Atherosclerosis (ICD10-I70.0) and Emphysema (ICD10-J43.9). Coronary atherosclerosis.  MRI C&T spine 12/18/2019 IMPRESSION: 1. Upper cervical spinous process mild marrow  edema and interspinous soft tissue edema probably related to history of recent trauma. 2. Possible thin dorsal extension of thoracic epidural process into the cervical spine. 3. Multilevel degenerative changes with suboptimal evaluation due to Artifact. Dorsal and possibly right lateral epidural collection from T1 to 4. T3-T4 causing moderate canal stenosis. Given history, this may reflect hematoma or infection.  MRI R shoulder 12/20/2019 IMPRESSION: 1. No glenohumeral joint effusion to suggest septic arthritis, as clinically questioned. 2. Pleural-based right apical soft tissue mass measuring 3 x 2 cm. Findings are concerning for malignancy. Further evaluation with PET-CT is suggested. 3. Full-thickness tear of the mid to posterior fibers of the distal supraspinatus tendon with retraction to the level of the humeral head apex. The anterior most supraspinatus fibers are intact with tendinosis. 4. Intramuscular edema within the supraspinatus tendon, is likely reactive secondary to tendon pathology. No significant atrophy or fatty infiltration of the rotator cuff musculature. 5. Subcutaneous and soft tissue edema most pronounced in the supraclavicular region without focal fluid collection 6. Moderate acromioclavicular and glenohumeral osteoarthritis. 7. Intra-articular biceps tendinosis.  Discharge Instructions: Discharge Instructions    Call MD for:  difficulty breathing, headache or visual disturbances   Complete by: As directed    Call MD for:  extreme fatigue   Complete by: As directed    Call MD for:  hives   Complete by: As directed    Call MD for:  persistant dizziness or light-headedness   Complete by: As directed    Call MD  for:  persistant nausea and vomiting   Complete by: As directed    Call MD for:  redness, tenderness, or signs of infection (pain, swelling, redness, odor or green/yellow discharge around incision site)   Complete by: As directed    Call MD for:  severe uncontrolled pain   Complete by: As directed    Call MD for:  temperature >100.4   Complete by: As directed    Diet - low sodium heart healthy   Complete by: As directed    Discharge instructions   Complete by: As directed    Thank you for allowing [place] to take care of you during your hospitalization. Below is a summary of what we treated:  1. Epidural abscess -You had collection of bacteria that was sitting close to your spinal cord and nerves. We are treating this with IV antibiotics (Cefazolin) for 6-8 weeks.  -Please follow up with the Infectious Disease doctors. They will determine how long you will need the antibiotics. Your appointment with Dr. Novella Olive is on 01/21/2020 at 3:45 PM   2. R hand weakness -You are going to a [facility] to get rehab to improve your strength. -On one of your imaging studies, there appeared to be a lesion on the upper part of your R lung compressing some of the nerves in your R shoulder, which may be the cause of your weakness. Please follow up with Dr. Annamaria Boots on 09/21/2020 at 11:00 AM to follow this up.  If you have any questions or concerns, please feel free to reach out to [place].   Home infusion instructions   Complete by: As directed    Instructions: Flushing of vascular access device: 0.9% NaCl pre/post medication administration and prn patency; Heparin 100 u/ml, 55m for implanted ports and Heparin 10u/ml, 582mfor all other central venous catheters.   Increase activity slowly   Complete by: As directed      Signed: AnEarlene PlaterMD Internal Medicine, PGY1  Pager: [phone]  12/25/2019,10:57 AM

## 2019-12-25 NOTE — Progress Notes (Signed)
Pt appears to be neurologically stable, no active neurosurgical issues. Will sign off, but happy to help if there are any concerns or questions. Pt will need to follow up with me in 2-3 weeks for a wound check, will place my follow up information in the discharge information section in Epic. Please call with any concerns or questions.  [name], MD

## 2019-12-25 NOTE — Progress Notes (Addendum)
   12/25/19 1535  Urine Characteristics  Urinary Interventions Intermittent/Straight cath  Intermittent/Straight Cath (mL) 475 mL   Patient voided spontaneously in toilet, bladder scan performed and showed  421ml in therefore per protocol in and out cath performed as shown above. Paged MD to make him aware but no return call at this time.

## 2019-12-26 ENCOUNTER — Other Ambulatory Visit: Payer: Self-pay | Admitting: *Deleted

## 2019-12-26 NOTE — Patient Outreach (Signed)
[facility]) Care Management  12/26/2019  Luisantonio [facility] Oct 18, 1946 [id number]   Noted that member was discharged to SNF yesterday.  Will close case at this time, anticipate new referral from insurance when member discharged.  Will re-open at that time.  Valente David, [place], MSN [place] [phone]

## 2019-12-29 DIAGNOSIS — G8929 Other chronic pain: Secondary | ICD-10-CM | POA: Diagnosis not present

## 2019-12-29 DIAGNOSIS — A4901 Methicillin susceptible Staphylococcus aureus infection, unspecified site: Secondary | ICD-10-CM | POA: Diagnosis not present

## 2019-12-29 DIAGNOSIS — M170 Bilateral primary osteoarthritis of knee: Secondary | ICD-10-CM | POA: Diagnosis not present

## 2019-12-29 DIAGNOSIS — G062 Extradural and subdural abscess, unspecified: Secondary | ICD-10-CM | POA: Diagnosis not present

## 2019-12-31 DIAGNOSIS — G062 Extradural and subdural abscess, unspecified: Secondary | ICD-10-CM | POA: Diagnosis not present

## 2019-12-31 DIAGNOSIS — G8929 Other chronic pain: Secondary | ICD-10-CM | POA: Diagnosis not present

## 2019-12-31 DIAGNOSIS — M170 Bilateral primary osteoarthritis of knee: Secondary | ICD-10-CM | POA: Diagnosis not present

## 2019-12-31 DIAGNOSIS — A4901 Methicillin susceptible Staphylococcus aureus infection, unspecified site: Secondary | ICD-10-CM | POA: Diagnosis not present

## 2020-01-02 DIAGNOSIS — M545 Low back pain: Secondary | ICD-10-CM | POA: Diagnosis not present

## 2020-01-02 DIAGNOSIS — R29898 Other symptoms and signs involving the musculoskeletal system: Secondary | ICD-10-CM | POA: Diagnosis not present

## 2020-01-02 DIAGNOSIS — R7881 Bacteremia: Secondary | ICD-10-CM | POA: Diagnosis not present

## 2020-01-02 DIAGNOSIS — G061 Intraspinal abscess and granuloma: Secondary | ICD-10-CM | POA: Diagnosis not present

## 2020-01-02 DIAGNOSIS — R918 Other nonspecific abnormal finding of lung field: Secondary | ICD-10-CM | POA: Diagnosis not present

## 2020-01-06 ENCOUNTER — Ambulatory Visit (HOSPITAL_COMMUNITY): Payer: Medicare Other

## 2020-01-06 DIAGNOSIS — G062 Extradural and subdural abscess, unspecified: Secondary | ICD-10-CM | POA: Diagnosis not present

## 2020-01-06 DIAGNOSIS — A4901 Methicillin susceptible Staphylococcus aureus infection, unspecified site: Secondary | ICD-10-CM | POA: Diagnosis not present

## 2020-01-06 DIAGNOSIS — R600 Localized edema: Secondary | ICD-10-CM | POA: Diagnosis not present

## 2020-01-06 DIAGNOSIS — M170 Bilateral primary osteoarthritis of knee: Secondary | ICD-10-CM | POA: Diagnosis not present

## 2020-01-06 DIAGNOSIS — G8929 Other chronic pain: Secondary | ICD-10-CM | POA: Diagnosis not present

## 2020-01-08 DIAGNOSIS — G8929 Other chronic pain: Secondary | ICD-10-CM | POA: Diagnosis not present

## 2020-01-08 DIAGNOSIS — R600 Localized edema: Secondary | ICD-10-CM | POA: Diagnosis not present

## 2020-01-08 DIAGNOSIS — A4901 Methicillin susceptible Staphylococcus aureus infection, unspecified site: Secondary | ICD-10-CM | POA: Diagnosis not present

## 2020-01-08 DIAGNOSIS — G062 Extradural and subdural abscess, unspecified: Secondary | ICD-10-CM | POA: Diagnosis not present

## 2020-01-08 DIAGNOSIS — M170 Bilateral primary osteoarthritis of knee: Secondary | ICD-10-CM | POA: Diagnosis not present

## 2020-01-13 DIAGNOSIS — M545 Low back pain: Secondary | ICD-10-CM | POA: Diagnosis not present

## 2020-01-13 DIAGNOSIS — J449 Chronic obstructive pulmonary disease, unspecified: Secondary | ICD-10-CM | POA: Diagnosis not present

## 2020-01-13 DIAGNOSIS — R7881 Bacteremia: Secondary | ICD-10-CM | POA: Diagnosis not present

## 2020-01-13 DIAGNOSIS — G062 Extradural and subdural abscess, unspecified: Secondary | ICD-10-CM | POA: Diagnosis not present

## 2020-01-13 DIAGNOSIS — G8929 Other chronic pain: Secondary | ICD-10-CM | POA: Diagnosis not present

## 2020-01-15 DIAGNOSIS — Z792 Long term (current) use of antibiotics: Secondary | ICD-10-CM | POA: Diagnosis not present

## 2020-01-15 DIAGNOSIS — I700 Atherosclerosis of aorta: Secondary | ICD-10-CM | POA: Diagnosis not present

## 2020-01-15 DIAGNOSIS — I2510 Atherosclerotic heart disease of native coronary artery without angina pectoris: Secondary | ICD-10-CM | POA: Diagnosis not present

## 2020-01-15 DIAGNOSIS — G8929 Other chronic pain: Secondary | ICD-10-CM | POA: Diagnosis not present

## 2020-01-15 DIAGNOSIS — A4901 Methicillin susceptible Staphylococcus aureus infection, unspecified site: Secondary | ICD-10-CM | POA: Diagnosis not present

## 2020-01-15 DIAGNOSIS — M7520 Bicipital tendinitis, unspecified shoulder: Secondary | ICD-10-CM | POA: Diagnosis not present

## 2020-01-15 DIAGNOSIS — M1711 Unilateral primary osteoarthritis, right knee: Secondary | ICD-10-CM | POA: Diagnosis not present

## 2020-01-15 DIAGNOSIS — Z9181 History of falling: Secondary | ICD-10-CM | POA: Diagnosis not present

## 2020-01-15 DIAGNOSIS — B9561 Methicillin susceptible Staphylococcus aureus infection as the cause of diseases classified elsewhere: Secondary | ICD-10-CM | POA: Diagnosis not present

## 2020-01-15 DIAGNOSIS — J439 Emphysema, unspecified: Secondary | ICD-10-CM | POA: Diagnosis not present

## 2020-01-15 DIAGNOSIS — Z452 Encounter for adjustment and management of vascular access device: Secondary | ICD-10-CM | POA: Diagnosis not present

## 2020-01-15 DIAGNOSIS — M4804 Spinal stenosis, thoracic region: Secondary | ICD-10-CM | POA: Diagnosis not present

## 2020-01-15 DIAGNOSIS — G20 Parkinson's disease: Secondary | ICD-10-CM | POA: Diagnosis not present

## 2020-01-15 DIAGNOSIS — T8149XA Infection following a procedure, other surgical site, initial encounter: Secondary | ICD-10-CM | POA: Diagnosis not present

## 2020-01-15 DIAGNOSIS — R7881 Bacteremia: Secondary | ICD-10-CM | POA: Diagnosis not present

## 2020-01-15 DIAGNOSIS — G062 Extradural and subdural abscess, unspecified: Secondary | ICD-10-CM | POA: Diagnosis not present

## 2020-01-15 DIAGNOSIS — G061 Intraspinal abscess and granuloma: Secondary | ICD-10-CM | POA: Diagnosis not present

## 2020-01-16 DIAGNOSIS — R030 Elevated blood-pressure reading, without diagnosis of hypertension: Secondary | ICD-10-CM | POA: Insufficient documentation

## 2020-01-16 DIAGNOSIS — Z6827 Body mass index (BMI) 27.0-27.9, adult: Secondary | ICD-10-CM | POA: Insufficient documentation

## 2020-01-19 ENCOUNTER — Encounter (HOSPITAL_COMMUNITY)
Admission: RE | Admit: 2020-01-19 | Discharge: 2020-01-19 | Disposition: A | Discharge status: Home / Self Care | Payer: Medicare Other | Source: Ambulatory Visit | Attending: Internal Medicine | Admitting: Internal Medicine

## 2020-01-19 DIAGNOSIS — Z792 Long term (current) use of antibiotics: Secondary | ICD-10-CM | POA: Diagnosis not present

## 2020-01-19 DIAGNOSIS — I2510 Atherosclerotic heart disease of native coronary artery without angina pectoris: Secondary | ICD-10-CM | POA: Diagnosis not present

## 2020-01-19 DIAGNOSIS — M4804 Spinal stenosis, thoracic region: Secondary | ICD-10-CM | POA: Diagnosis not present

## 2020-01-19 DIAGNOSIS — M1711 Unilateral primary osteoarthritis, right knee: Secondary | ICD-10-CM | POA: Diagnosis not present

## 2020-01-19 DIAGNOSIS — I700 Atherosclerosis of aorta: Secondary | ICD-10-CM | POA: Diagnosis not present

## 2020-01-19 DIAGNOSIS — Z452 Encounter for adjustment and management of vascular access device: Secondary | ICD-10-CM | POA: Diagnosis not present

## 2020-01-19 DIAGNOSIS — G061 Intraspinal abscess and granuloma: Secondary | ICD-10-CM | POA: Diagnosis not present

## 2020-01-19 DIAGNOSIS — M7520 Bicipital tendinitis, unspecified shoulder: Secondary | ICD-10-CM | POA: Diagnosis not present

## 2020-01-19 DIAGNOSIS — G8929 Other chronic pain: Secondary | ICD-10-CM | POA: Diagnosis not present

## 2020-01-19 DIAGNOSIS — M869 Osteomyelitis, unspecified: Secondary | ICD-10-CM | POA: Diagnosis not present

## 2020-01-19 DIAGNOSIS — B9561 Methicillin susceptible Staphylococcus aureus infection as the cause of diseases classified elsewhere: Secondary | ICD-10-CM | POA: Diagnosis not present

## 2020-01-19 DIAGNOSIS — G20 Parkinson's disease: Secondary | ICD-10-CM | POA: Diagnosis not present

## 2020-01-19 DIAGNOSIS — T8149XA Infection following a procedure, other surgical site, initial encounter: Secondary | ICD-10-CM | POA: Diagnosis not present

## 2020-01-19 DIAGNOSIS — J439 Emphysema, unspecified: Secondary | ICD-10-CM | POA: Diagnosis not present

## 2020-01-19 DIAGNOSIS — Z9181 History of falling: Secondary | ICD-10-CM | POA: Diagnosis not present

## 2020-01-19 LAB — CBC WITH DIFFERENTIAL/PLATELET
Abs Immature Granulocytes: 0.04 10*3/uL (ref 0.00–0.07)
Basophils Absolute: 0 10*3/uL (ref 0.0–0.1)
Basophils Relative: 0 %
Eosinophils Absolute: 0.3 10*3/uL (ref 0.0–0.5)
Eosinophils Relative: 3 %
HCT: 35.1 % — ABNORMAL LOW (ref 39.0–52.0)
Hemoglobin: 10.9 g/dL — ABNORMAL LOW (ref 13.0–17.0)
Immature Granulocytes: 1 %
Lymphocytes Relative: 28 %
Lymphs Abs: 2.4 10*3/uL (ref 0.7–4.0)
MCH: 30.5 pg (ref 26.0–34.0)
MCHC: 31.1 g/dL (ref 30.0–36.0)
MCV: 98.3 fL (ref 80.0–100.0)
Monocytes Absolute: 0.9 10*3/uL (ref 0.1–1.0)
Monocytes Relative: 11 %
Neutro Abs: 5.1 10*3/uL (ref 1.7–7.7)
Neutrophils Relative %: 57 %
Platelets: 358 10*3/uL (ref 150–400)
RBC: 3.57 MIL/uL — ABNORMAL LOW (ref 4.22–5.81)
RDW: 13.1 % (ref 11.5–15.5)
WBC: 8.7 10*3/uL (ref 4.0–10.5)
nRBC: 0 % (ref 0.0–0.2)

## 2020-01-19 LAB — BASIC METABOLIC PANEL
Anion gap: 10 (ref 5–15)
BUN: 16 mg/dL (ref 8–23)
CO2: 33 mmol/L — ABNORMAL HIGH (ref 22–32)
Calcium: 8.3 mg/dL — ABNORMAL LOW (ref 8.9–10.3)
Chloride: 97 mmol/L — ABNORMAL LOW (ref 98–111)
Creatinine, Ser: 1.08 mg/dL (ref 0.61–1.24)
GFR calc Af Amer: >60 mL/min (ref >60)
GFR calc non Af Amer: >60 mL/min (ref >60)
Glucose, Bld: 84 mg/dL (ref 70–99)
Potassium: 4.1 mmol/L (ref 3.5–5.1)
Sodium: 140 mmol/L (ref 135–145)

## 2020-01-19 LAB — SEDIMENTATION RATE: Sed Rate: 75 mm/hr — ABNORMAL HIGH (ref 0–16)

## 2020-01-20 ENCOUNTER — Telehealth: Payer: Self-pay

## 2020-01-20 DIAGNOSIS — K5900 Constipation, unspecified: Secondary | ICD-10-CM | POA: Diagnosis not present

## 2020-01-20 DIAGNOSIS — I10 Essential (primary) hypertension: Secondary | ICD-10-CM | POA: Diagnosis not present

## 2020-01-20 DIAGNOSIS — M1712 Unilateral primary osteoarthritis, left knee: Secondary | ICD-10-CM | POA: Diagnosis not present

## 2020-01-20 DIAGNOSIS — Z79899 Other long term (current) drug therapy: Secondary | ICD-10-CM | POA: Diagnosis not present

## 2020-01-20 DIAGNOSIS — G894 Chronic pain syndrome: Secondary | ICD-10-CM | POA: Diagnosis not present

## 2020-01-20 NOTE — Telephone Encounter (Signed)
Yes, ok to give

## 2020-01-20 NOTE — Telephone Encounter (Signed)
Received call today from [place], [place] for physical therapy orders. Verbal order is for Balance and Gait for 2 weeks.  P: [place], Kent Flowers

## 2020-01-21 ENCOUNTER — Other Ambulatory Visit: Payer: Self-pay

## 2020-01-21 ENCOUNTER — Ambulatory Visit (INDEPENDENT_AMBULATORY_CARE_PROVIDER_SITE_OTHER): Payer: Medicare Other | Admitting: Internal Medicine

## 2020-01-21 ENCOUNTER — Encounter: Payer: Self-pay | Admitting: Internal Medicine

## 2020-01-21 VITALS — BP 135/73 | HR 94 | Temp 99.6°F | Ht 68.0 in | Wt 183.0 lb

## 2020-01-21 DIAGNOSIS — Z452 Encounter for adjustment and management of vascular access device: Secondary | ICD-10-CM | POA: Diagnosis not present

## 2020-01-21 DIAGNOSIS — I700 Atherosclerosis of aorta: Secondary | ICD-10-CM | POA: Diagnosis not present

## 2020-01-21 DIAGNOSIS — R918 Other nonspecific abnormal finding of lung field: Secondary | ICD-10-CM | POA: Diagnosis not present

## 2020-01-21 DIAGNOSIS — T8149XA Infection following a procedure, other surgical site, initial encounter: Secondary | ICD-10-CM | POA: Diagnosis not present

## 2020-01-21 DIAGNOSIS — G20 Parkinson's disease: Secondary | ICD-10-CM | POA: Diagnosis not present

## 2020-01-21 DIAGNOSIS — G061 Intraspinal abscess and granuloma: Secondary | ICD-10-CM

## 2020-01-21 DIAGNOSIS — I2510 Atherosclerotic heart disease of native coronary artery without angina pectoris: Secondary | ICD-10-CM | POA: Diagnosis not present

## 2020-01-21 DIAGNOSIS — M7520 Bicipital tendinitis, unspecified shoulder: Secondary | ICD-10-CM | POA: Diagnosis not present

## 2020-01-21 DIAGNOSIS — Z9181 History of falling: Secondary | ICD-10-CM | POA: Diagnosis not present

## 2020-01-21 DIAGNOSIS — G8929 Other chronic pain: Secondary | ICD-10-CM | POA: Diagnosis not present

## 2020-01-21 DIAGNOSIS — Z792 Long term (current) use of antibiotics: Secondary | ICD-10-CM | POA: Diagnosis not present

## 2020-01-21 DIAGNOSIS — J439 Emphysema, unspecified: Secondary | ICD-10-CM | POA: Diagnosis not present

## 2020-01-21 DIAGNOSIS — B9561 Methicillin susceptible Staphylococcus aureus infection as the cause of diseases classified elsewhere: Secondary | ICD-10-CM | POA: Diagnosis not present

## 2020-01-21 DIAGNOSIS — M1711 Unilateral primary osteoarthritis, right knee: Secondary | ICD-10-CM | POA: Diagnosis not present

## 2020-01-21 DIAGNOSIS — M4804 Spinal stenosis, thoracic region: Secondary | ICD-10-CM | POA: Diagnosis not present

## 2020-01-22 ENCOUNTER — Encounter: Payer: Self-pay | Admitting: Internal Medicine

## 2020-01-22 DIAGNOSIS — I2510 Atherosclerotic heart disease of native coronary artery without angina pectoris: Secondary | ICD-10-CM | POA: Diagnosis not present

## 2020-01-22 DIAGNOSIS — R918 Other nonspecific abnormal finding of lung field: Secondary | ICD-10-CM | POA: Insufficient documentation

## 2020-01-22 DIAGNOSIS — B9561 Methicillin susceptible Staphylococcus aureus infection as the cause of diseases classified elsewhere: Secondary | ICD-10-CM | POA: Diagnosis not present

## 2020-01-22 DIAGNOSIS — G20 Parkinson's disease: Secondary | ICD-10-CM | POA: Diagnosis not present

## 2020-01-22 DIAGNOSIS — G061 Intraspinal abscess and granuloma: Secondary | ICD-10-CM | POA: Diagnosis not present

## 2020-01-22 DIAGNOSIS — I700 Atherosclerosis of aorta: Secondary | ICD-10-CM | POA: Diagnosis not present

## 2020-01-22 DIAGNOSIS — J439 Emphysema, unspecified: Secondary | ICD-10-CM | POA: Diagnosis not present

## 2020-01-22 DIAGNOSIS — Z452 Encounter for adjustment and management of vascular access device: Secondary | ICD-10-CM | POA: Insufficient documentation

## 2020-01-22 DIAGNOSIS — G8929 Other chronic pain: Secondary | ICD-10-CM | POA: Diagnosis not present

## 2020-01-22 DIAGNOSIS — Z9181 History of falling: Secondary | ICD-10-CM | POA: Diagnosis not present

## 2020-01-22 DIAGNOSIS — M7520 Bicipital tendinitis, unspecified shoulder: Secondary | ICD-10-CM | POA: Diagnosis not present

## 2020-01-22 DIAGNOSIS — T8149XA Infection following a procedure, other surgical site, initial encounter: Secondary | ICD-10-CM | POA: Diagnosis not present

## 2020-01-22 DIAGNOSIS — M1711 Unilateral primary osteoarthritis, right knee: Secondary | ICD-10-CM | POA: Diagnosis not present

## 2020-01-22 DIAGNOSIS — Z792 Long term (current) use of antibiotics: Secondary | ICD-10-CM | POA: Diagnosis not present

## 2020-01-22 DIAGNOSIS — M4804 Spinal stenosis, thoracic region: Secondary | ICD-10-CM | POA: Diagnosis not present

## 2020-01-22 NOTE — Assessment & Plan Note (Addendum)
He clinically is doing well with little pain, closed incision and improving movement.  ESR about the same as previously but no concerns clinically with good movement and incision closed without concerns.  Therefore, I will have him complete the antibiotics on 2/10 and will observe him off of antibiotics after that.   He will call with any concerns.

## 2020-01-22 NOTE — Assessment & Plan Note (Signed)
He has a pet scan ordered and will follow up with pulmonary or his PCP

## 2020-01-22 NOTE — Progress Notes (Signed)
   Subjective:    Patient ID: Kent Flowers, male    DOB: February 14, 1946, 74 y.o.   MRN: [id number]  HPI Here for hsfu He developed MSSA bacteremia and noted thoracic epidural abscess and placed on 6 weeks of IV cefazolin through February 10th.  He is here for follow up and reports no rash or diarrhea.  Back with limited motion but no significant pain. He had undergone T2 and T3 laminectomies by Dr. Zada Flowers on 12/31.  He was also found to have pleural-based right apical soft tissue mass on MRI, though CT done on 1/3 did not mention it.  He is scheduled for a PETCT on 2/24.     Review of Systems  Constitutional: Negative for chills and fever.  Gastrointestinal: Negative for diarrhea and nausea.  Musculoskeletal: Negative for back pain.  Skin: Negative for rash.       Objective:   Physical Exam Constitutional:      Appearance: Normal appearance.  Eyes:     General: No scleral icterus. Pulmonary:     Effort: Pulmonary effort is normal. No respiratory distress.  Neurological:     General: No focal deficit present.     Mental Status: He is alert.  Psychiatric:        Mood and Affect: Mood normal.   SH: History of tobacco use        Assessment & Plan:

## 2020-01-22 NOTE — Assessment & Plan Note (Signed)
This will be removed at the end of treatment by home health.

## 2020-01-23 DIAGNOSIS — G062 Extradural and subdural abscess, unspecified: Secondary | ICD-10-CM | POA: Diagnosis not present

## 2020-01-23 DIAGNOSIS — M7520 Bicipital tendinitis, unspecified shoulder: Secondary | ICD-10-CM | POA: Diagnosis not present

## 2020-01-23 DIAGNOSIS — G20 Parkinson's disease: Secondary | ICD-10-CM | POA: Diagnosis not present

## 2020-01-23 DIAGNOSIS — T8149XA Infection following a procedure, other surgical site, initial encounter: Secondary | ICD-10-CM | POA: Diagnosis not present

## 2020-01-23 DIAGNOSIS — Z792 Long term (current) use of antibiotics: Secondary | ICD-10-CM | POA: Diagnosis not present

## 2020-01-23 DIAGNOSIS — G061 Intraspinal abscess and granuloma: Secondary | ICD-10-CM | POA: Diagnosis not present

## 2020-01-23 DIAGNOSIS — B9561 Methicillin susceptible Staphylococcus aureus infection as the cause of diseases classified elsewhere: Secondary | ICD-10-CM | POA: Diagnosis not present

## 2020-01-23 DIAGNOSIS — I700 Atherosclerosis of aorta: Secondary | ICD-10-CM | POA: Diagnosis not present

## 2020-01-23 DIAGNOSIS — R7881 Bacteremia: Secondary | ICD-10-CM | POA: Diagnosis not present

## 2020-01-23 DIAGNOSIS — I2510 Atherosclerotic heart disease of native coronary artery without angina pectoris: Secondary | ICD-10-CM | POA: Diagnosis not present

## 2020-01-23 DIAGNOSIS — Z452 Encounter for adjustment and management of vascular access device: Secondary | ICD-10-CM | POA: Diagnosis not present

## 2020-01-23 DIAGNOSIS — Z9181 History of falling: Secondary | ICD-10-CM | POA: Diagnosis not present

## 2020-01-23 DIAGNOSIS — M4804 Spinal stenosis, thoracic region: Secondary | ICD-10-CM | POA: Diagnosis not present

## 2020-01-23 DIAGNOSIS — M1711 Unilateral primary osteoarthritis, right knee: Secondary | ICD-10-CM | POA: Diagnosis not present

## 2020-01-23 DIAGNOSIS — A4901 Methicillin susceptible Staphylococcus aureus infection, unspecified site: Secondary | ICD-10-CM | POA: Diagnosis not present

## 2020-01-23 DIAGNOSIS — J439 Emphysema, unspecified: Secondary | ICD-10-CM | POA: Diagnosis not present

## 2020-01-23 DIAGNOSIS — G8929 Other chronic pain: Secondary | ICD-10-CM | POA: Diagnosis not present

## 2020-01-26 ENCOUNTER — Encounter (HOSPITAL_COMMUNITY)
Admission: RE | Admit: 2020-01-26 | Discharge: 2020-01-26 | Disposition: A | Discharge status: Home / Self Care | Payer: Medicare Other | Source: Ambulatory Visit | Attending: Internal Medicine | Admitting: Internal Medicine

## 2020-01-26 DIAGNOSIS — Z9181 History of falling: Secondary | ICD-10-CM | POA: Diagnosis not present

## 2020-01-26 DIAGNOSIS — G20 Parkinson's disease: Secondary | ICD-10-CM | POA: Diagnosis not present

## 2020-01-26 DIAGNOSIS — G061 Intraspinal abscess and granuloma: Secondary | ICD-10-CM | POA: Diagnosis not present

## 2020-01-26 DIAGNOSIS — B9561 Methicillin susceptible Staphylococcus aureus infection as the cause of diseases classified elsewhere: Secondary | ICD-10-CM | POA: Diagnosis not present

## 2020-01-26 DIAGNOSIS — J439 Emphysema, unspecified: Secondary | ICD-10-CM | POA: Diagnosis not present

## 2020-01-26 DIAGNOSIS — Z792 Long term (current) use of antibiotics: Secondary | ICD-10-CM | POA: Diagnosis not present

## 2020-01-26 DIAGNOSIS — M1711 Unilateral primary osteoarthritis, right knee: Secondary | ICD-10-CM | POA: Diagnosis not present

## 2020-01-26 DIAGNOSIS — I2510 Atherosclerotic heart disease of native coronary artery without angina pectoris: Secondary | ICD-10-CM | POA: Diagnosis not present

## 2020-01-26 DIAGNOSIS — M4804 Spinal stenosis, thoracic region: Secondary | ICD-10-CM | POA: Diagnosis not present

## 2020-01-26 DIAGNOSIS — M7520 Bicipital tendinitis, unspecified shoulder: Secondary | ICD-10-CM | POA: Diagnosis not present

## 2020-01-26 DIAGNOSIS — M869 Osteomyelitis, unspecified: Secondary | ICD-10-CM | POA: Diagnosis not present

## 2020-01-26 DIAGNOSIS — T8149XA Infection following a procedure, other surgical site, initial encounter: Secondary | ICD-10-CM | POA: Diagnosis not present

## 2020-01-26 DIAGNOSIS — G8929 Other chronic pain: Secondary | ICD-10-CM | POA: Diagnosis not present

## 2020-01-26 DIAGNOSIS — Z452 Encounter for adjustment and management of vascular access device: Secondary | ICD-10-CM | POA: Diagnosis not present

## 2020-01-26 DIAGNOSIS — I700 Atherosclerosis of aorta: Secondary | ICD-10-CM | POA: Diagnosis not present

## 2020-01-26 LAB — CBC WITH DIFFERENTIAL/PLATELET
Abs Immature Granulocytes: 0.03 10*3/uL (ref 0.00–0.07)
Basophils Absolute: 0 10*3/uL (ref 0.0–0.1)
Basophils Relative: 0 %
Eosinophils Absolute: 0.3 10*3/uL (ref 0.0–0.5)
Eosinophils Relative: 3 %
HCT: 34.7 % — ABNORMAL LOW (ref 39.0–52.0)
Hemoglobin: 10.9 g/dL — ABNORMAL LOW (ref 13.0–17.0)
Immature Granulocytes: 0 %
Lymphocytes Relative: 23 %
Lymphs Abs: 2.3 10*3/uL (ref 0.7–4.0)
MCH: 30.4 pg (ref 26.0–34.0)
MCHC: 31.4 g/dL (ref 30.0–36.0)
MCV: 96.9 fL (ref 80.0–100.0)
Monocytes Absolute: 1.2 10*3/uL — ABNORMAL HIGH (ref 0.1–1.0)
Monocytes Relative: 11 %
Neutro Abs: 6.4 10*3/uL (ref 1.7–7.7)
Neutrophils Relative %: 63 %
Platelets: 368 10*3/uL (ref 150–400)
RBC: 3.58 MIL/uL — ABNORMAL LOW (ref 4.22–5.81)
RDW: 13.2 % (ref 11.5–15.5)
WBC: 10.3 10*3/uL (ref 4.0–10.5)
nRBC: 0 % (ref 0.0–0.2)

## 2020-01-26 LAB — COMPREHENSIVE METABOLIC PANEL
ALT: <5 U/L (ref 0–44)
AST: 19 U/L (ref 15–41)
Albumin: 3.3 g/dL — ABNORMAL LOW (ref 3.5–5.0)
Alkaline Phosphatase: 106 U/L (ref 38–126)
Anion gap: 13 (ref 5–15)
BUN: 16 mg/dL (ref 8–23)
CO2: 29 mmol/L (ref 22–32)
Calcium: 8.5 mg/dL — ABNORMAL LOW (ref 8.9–10.3)
Chloride: 95 mmol/L — ABNORMAL LOW (ref 98–111)
Creatinine, Ser: 1.03 mg/dL (ref 0.61–1.24)
GFR calc Af Amer: >60 mL/min (ref >60)
GFR calc non Af Amer: >60 mL/min (ref >60)
Glucose, Bld: 131 mg/dL — ABNORMAL HIGH (ref 70–99)
Potassium: 4.1 mmol/L (ref 3.5–5.1)
Sodium: 137 mmol/L (ref 135–145)
Total Bilirubin: 0.6 mg/dL (ref 0.3–1.2)
Total Protein: 7.6 g/dL (ref 6.5–8.1)

## 2020-01-26 LAB — MAGNESIUM: Magnesium: 1.5 mg/dL — ABNORMAL LOW (ref 1.7–2.4)

## 2020-01-26 LAB — SEDIMENTATION RATE: Sed Rate: 76 mm/hr — ABNORMAL HIGH (ref 0–16)

## 2020-01-26 LAB — C-REACTIVE PROTEIN: CRP: 8.6 mg/dL — ABNORMAL HIGH (ref <1.0)

## 2020-01-26 LAB — BRAIN NATRIURETIC PEPTIDE: B Natriuretic Peptide: 54 pg/mL (ref 0.0–100.0)

## 2020-01-27 DIAGNOSIS — M4804 Spinal stenosis, thoracic region: Secondary | ICD-10-CM | POA: Diagnosis not present

## 2020-01-27 DIAGNOSIS — K5900 Constipation, unspecified: Secondary | ICD-10-CM | POA: Diagnosis not present

## 2020-01-27 DIAGNOSIS — Z9181 History of falling: Secondary | ICD-10-CM | POA: Diagnosis not present

## 2020-01-27 DIAGNOSIS — T8149XA Infection following a procedure, other surgical site, initial encounter: Secondary | ICD-10-CM | POA: Diagnosis not present

## 2020-01-27 DIAGNOSIS — J439 Emphysema, unspecified: Secondary | ICD-10-CM | POA: Diagnosis not present

## 2020-01-27 DIAGNOSIS — G8929 Other chronic pain: Secondary | ICD-10-CM | POA: Diagnosis not present

## 2020-01-27 DIAGNOSIS — G20 Parkinson's disease: Secondary | ICD-10-CM | POA: Diagnosis not present

## 2020-01-27 DIAGNOSIS — M7520 Bicipital tendinitis, unspecified shoulder: Secondary | ICD-10-CM | POA: Diagnosis not present

## 2020-01-27 DIAGNOSIS — B9561 Methicillin susceptible Staphylococcus aureus infection as the cause of diseases classified elsewhere: Secondary | ICD-10-CM | POA: Diagnosis not present

## 2020-01-27 DIAGNOSIS — Z452 Encounter for adjustment and management of vascular access device: Secondary | ICD-10-CM | POA: Diagnosis not present

## 2020-01-27 DIAGNOSIS — G061 Intraspinal abscess and granuloma: Secondary | ICD-10-CM | POA: Diagnosis not present

## 2020-01-27 DIAGNOSIS — Z792 Long term (current) use of antibiotics: Secondary | ICD-10-CM | POA: Diagnosis not present

## 2020-01-27 DIAGNOSIS — R600 Localized edema: Secondary | ICD-10-CM | POA: Diagnosis not present

## 2020-01-27 DIAGNOSIS — I10 Essential (primary) hypertension: Secondary | ICD-10-CM | POA: Diagnosis not present

## 2020-01-27 DIAGNOSIS — I700 Atherosclerosis of aorta: Secondary | ICD-10-CM | POA: Diagnosis not present

## 2020-01-27 DIAGNOSIS — I2510 Atherosclerotic heart disease of native coronary artery without angina pectoris: Secondary | ICD-10-CM | POA: Diagnosis not present

## 2020-01-27 DIAGNOSIS — M1711 Unilateral primary osteoarthritis, right knee: Secondary | ICD-10-CM | POA: Diagnosis not present

## 2020-01-29 DIAGNOSIS — J439 Emphysema, unspecified: Secondary | ICD-10-CM | POA: Diagnosis not present

## 2020-01-29 DIAGNOSIS — Z792 Long term (current) use of antibiotics: Secondary | ICD-10-CM | POA: Diagnosis not present

## 2020-01-29 DIAGNOSIS — I2510 Atherosclerotic heart disease of native coronary artery without angina pectoris: Secondary | ICD-10-CM | POA: Diagnosis not present

## 2020-01-29 DIAGNOSIS — M7520 Bicipital tendinitis, unspecified shoulder: Secondary | ICD-10-CM | POA: Diagnosis not present

## 2020-01-29 DIAGNOSIS — M4804 Spinal stenosis, thoracic region: Secondary | ICD-10-CM | POA: Diagnosis not present

## 2020-01-29 DIAGNOSIS — G8929 Other chronic pain: Secondary | ICD-10-CM | POA: Diagnosis not present

## 2020-01-29 DIAGNOSIS — M1711 Unilateral primary osteoarthritis, right knee: Secondary | ICD-10-CM | POA: Diagnosis not present

## 2020-01-29 DIAGNOSIS — G061 Intraspinal abscess and granuloma: Secondary | ICD-10-CM | POA: Diagnosis not present

## 2020-01-29 DIAGNOSIS — I700 Atherosclerosis of aorta: Secondary | ICD-10-CM | POA: Diagnosis not present

## 2020-01-29 DIAGNOSIS — T8149XA Infection following a procedure, other surgical site, initial encounter: Secondary | ICD-10-CM | POA: Diagnosis not present

## 2020-01-29 DIAGNOSIS — Z9181 History of falling: Secondary | ICD-10-CM | POA: Diagnosis not present

## 2020-01-29 DIAGNOSIS — B9561 Methicillin susceptible Staphylococcus aureus infection as the cause of diseases classified elsewhere: Secondary | ICD-10-CM | POA: Diagnosis not present

## 2020-01-29 DIAGNOSIS — G20 Parkinson's disease: Secondary | ICD-10-CM | POA: Diagnosis not present

## 2020-01-29 DIAGNOSIS — Z452 Encounter for adjustment and management of vascular access device: Secondary | ICD-10-CM | POA: Diagnosis not present

## 2020-01-30 DIAGNOSIS — B9561 Methicillin susceptible Staphylococcus aureus infection as the cause of diseases classified elsewhere: Secondary | ICD-10-CM | POA: Diagnosis not present

## 2020-01-30 DIAGNOSIS — M4804 Spinal stenosis, thoracic region: Secondary | ICD-10-CM | POA: Diagnosis not present

## 2020-01-30 DIAGNOSIS — Z9181 History of falling: Secondary | ICD-10-CM | POA: Diagnosis not present

## 2020-01-30 DIAGNOSIS — Z452 Encounter for adjustment and management of vascular access device: Secondary | ICD-10-CM | POA: Diagnosis not present

## 2020-01-30 DIAGNOSIS — M1711 Unilateral primary osteoarthritis, right knee: Secondary | ICD-10-CM | POA: Diagnosis not present

## 2020-01-30 DIAGNOSIS — G061 Intraspinal abscess and granuloma: Secondary | ICD-10-CM | POA: Diagnosis not present

## 2020-01-30 DIAGNOSIS — M7520 Bicipital tendinitis, unspecified shoulder: Secondary | ICD-10-CM | POA: Diagnosis not present

## 2020-01-30 DIAGNOSIS — J439 Emphysema, unspecified: Secondary | ICD-10-CM | POA: Diagnosis not present

## 2020-01-30 DIAGNOSIS — G8929 Other chronic pain: Secondary | ICD-10-CM | POA: Diagnosis not present

## 2020-01-30 DIAGNOSIS — G20 Parkinson's disease: Secondary | ICD-10-CM | POA: Diagnosis not present

## 2020-01-30 DIAGNOSIS — Z792 Long term (current) use of antibiotics: Secondary | ICD-10-CM | POA: Diagnosis not present

## 2020-01-30 DIAGNOSIS — I2510 Atherosclerotic heart disease of native coronary artery without angina pectoris: Secondary | ICD-10-CM | POA: Diagnosis not present

## 2020-01-30 DIAGNOSIS — I700 Atherosclerosis of aorta: Secondary | ICD-10-CM | POA: Diagnosis not present

## 2020-01-30 DIAGNOSIS — T8149XA Infection following a procedure, other surgical site, initial encounter: Secondary | ICD-10-CM | POA: Diagnosis not present

## 2020-02-02 ENCOUNTER — Telehealth: Payer: Self-pay

## 2020-02-02 DIAGNOSIS — R600 Localized edema: Secondary | ICD-10-CM | POA: Diagnosis not present

## 2020-02-02 NOTE — Telephone Encounter (Signed)
Received call today from Dolores Lory at [facility] regarding face to face cert form. States form was faxed on 2/2, but have not received response yet. Requested they refax to triage. [place]

## 2020-02-03 ENCOUNTER — Other Ambulatory Visit: Payer: Self-pay

## 2020-02-03 ENCOUNTER — Encounter (HOSPITAL_COMMUNITY): Payer: Self-pay

## 2020-02-03 ENCOUNTER — Inpatient Hospital Stay (HOSPITAL_COMMUNITY)
Admission: EM | Admit: 2020-02-03 | Discharge: 2020-02-07 | DRG: 871 | Disposition: A | Discharge status: Home Health | Payer: Medicare Other | Attending: Student | Admitting: Student

## 2020-02-03 DIAGNOSIS — M2578 Osteophyte, vertebrae: Secondary | ICD-10-CM

## 2020-02-03 DIAGNOSIS — R5381 Other malaise: Secondary | ICD-10-CM | POA: Diagnosis not present

## 2020-02-03 DIAGNOSIS — Z87891 Personal history of nicotine dependence: Secondary | ICD-10-CM

## 2020-02-03 DIAGNOSIS — R0689 Other abnormalities of breathing: Secondary | ICD-10-CM | POA: Diagnosis not present

## 2020-02-03 DIAGNOSIS — R509 Fever, unspecified: Secondary | ICD-10-CM | POA: Diagnosis not present

## 2020-02-03 DIAGNOSIS — M4642 Discitis, unspecified, cervical region: Secondary | ICD-10-CM | POA: Diagnosis not present

## 2020-02-03 DIAGNOSIS — Z8661 Personal history of infections of the central nervous system: Secondary | ICD-10-CM

## 2020-02-03 DIAGNOSIS — G8929 Other chronic pain: Secondary | ICD-10-CM | POA: Diagnosis present

## 2020-02-03 DIAGNOSIS — A419 Sepsis, unspecified organism: Secondary | ICD-10-CM | POA: Diagnosis present

## 2020-02-03 DIAGNOSIS — Z9181 History of falling: Secondary | ICD-10-CM | POA: Diagnosis not present

## 2020-02-03 DIAGNOSIS — R918 Other nonspecific abnormal finding of lung field: Secondary | ICD-10-CM | POA: Diagnosis present

## 2020-02-03 DIAGNOSIS — D519 Vitamin B12 deficiency anemia, unspecified: Secondary | ICD-10-CM | POA: Diagnosis present

## 2020-02-03 DIAGNOSIS — R000 Tachycardia, unspecified: Secondary | ICD-10-CM | POA: Diagnosis not present

## 2020-02-03 DIAGNOSIS — R531 Weakness: Secondary | ICD-10-CM | POA: Diagnosis present

## 2020-02-03 DIAGNOSIS — Z20822 Contact with and (suspected) exposure to covid-19: Secondary | ICD-10-CM | POA: Diagnosis not present

## 2020-02-03 DIAGNOSIS — M4624 Osteomyelitis of vertebra, thoracic region: Secondary | ICD-10-CM | POA: Diagnosis present

## 2020-02-03 DIAGNOSIS — G061 Intraspinal abscess and granuloma: Secondary | ICD-10-CM | POA: Diagnosis not present

## 2020-02-03 DIAGNOSIS — N401 Enlarged prostate with lower urinary tract symptoms: Secondary | ICD-10-CM | POA: Diagnosis present

## 2020-02-03 DIAGNOSIS — Z792 Long term (current) use of antibiotics: Secondary | ICD-10-CM | POA: Diagnosis not present

## 2020-02-03 DIAGNOSIS — M1711 Unilateral primary osteoarthritis, right knee: Secondary | ICD-10-CM | POA: Diagnosis not present

## 2020-02-03 DIAGNOSIS — M4644 Discitis, unspecified, thoracic region: Secondary | ICD-10-CM | POA: Diagnosis not present

## 2020-02-03 DIAGNOSIS — K219 Gastro-esophageal reflux disease without esophagitis: Secondary | ICD-10-CM | POA: Diagnosis present

## 2020-02-03 DIAGNOSIS — J449 Chronic obstructive pulmonary disease, unspecified: Secondary | ICD-10-CM | POA: Diagnosis not present

## 2020-02-03 DIAGNOSIS — Z66 Do not resuscitate: Secondary | ICD-10-CM | POA: Diagnosis not present

## 2020-02-03 DIAGNOSIS — M4620 Osteomyelitis of vertebra, site unspecified: Secondary | ICD-10-CM | POA: Diagnosis not present

## 2020-02-03 DIAGNOSIS — R338 Other retention of urine: Secondary | ICD-10-CM | POA: Diagnosis present

## 2020-02-03 DIAGNOSIS — M4649 Discitis, unspecified, multiple sites in spine: Secondary | ICD-10-CM | POA: Diagnosis present

## 2020-02-03 DIAGNOSIS — D509 Iron deficiency anemia, unspecified: Secondary | ICD-10-CM | POA: Diagnosis present

## 2020-02-03 DIAGNOSIS — G062 Extradural and subdural abscess, unspecified: Secondary | ICD-10-CM

## 2020-02-03 DIAGNOSIS — Z452 Encounter for adjustment and management of vascular access device: Secondary | ICD-10-CM | POA: Diagnosis not present

## 2020-02-03 DIAGNOSIS — M7520 Bicipital tendinitis, unspecified shoulder: Secondary | ICD-10-CM | POA: Diagnosis not present

## 2020-02-03 DIAGNOSIS — N179 Acute kidney failure, unspecified: Secondary | ICD-10-CM | POA: Diagnosis not present

## 2020-02-03 DIAGNOSIS — Z79891 Long term (current) use of opiate analgesic: Secondary | ICD-10-CM

## 2020-02-03 DIAGNOSIS — M4643 Discitis, unspecified, cervicothoracic region: Secondary | ICD-10-CM | POA: Diagnosis not present

## 2020-02-03 DIAGNOSIS — M4804 Spinal stenosis, thoracic region: Secondary | ICD-10-CM | POA: Diagnosis not present

## 2020-02-03 DIAGNOSIS — M545 Low back pain, unspecified: Secondary | ICD-10-CM | POA: Diagnosis present

## 2020-02-03 DIAGNOSIS — J439 Emphysema, unspecified: Secondary | ICD-10-CM | POA: Diagnosis present

## 2020-02-03 DIAGNOSIS — B9561 Methicillin susceptible Staphylococcus aureus infection as the cause of diseases classified elsewhere: Secondary | ICD-10-CM | POA: Diagnosis not present

## 2020-02-03 DIAGNOSIS — M1990 Unspecified osteoarthritis, unspecified site: Secondary | ICD-10-CM | POA: Diagnosis not present

## 2020-02-03 DIAGNOSIS — R7881 Bacteremia: Secondary | ICD-10-CM | POA: Diagnosis not present

## 2020-02-03 DIAGNOSIS — Z825 Family history of asthma and other chronic lower respiratory diseases: Secondary | ICD-10-CM | POA: Diagnosis not present

## 2020-02-03 DIAGNOSIS — M7989 Other specified soft tissue disorders: Secondary | ICD-10-CM | POA: Diagnosis not present

## 2020-02-03 DIAGNOSIS — Z8739 Personal history of other diseases of the musculoskeletal system and connective tissue: Secondary | ICD-10-CM | POA: Diagnosis not present

## 2020-02-03 DIAGNOSIS — I2510 Atherosclerotic heart disease of native coronary artery without angina pectoris: Secondary | ICD-10-CM | POA: Diagnosis not present

## 2020-02-03 DIAGNOSIS — R6520 Severe sepsis without septic shock: Secondary | ICD-10-CM | POA: Diagnosis not present

## 2020-02-03 DIAGNOSIS — R32 Unspecified urinary incontinence: Secondary | ICD-10-CM | POA: Diagnosis not present

## 2020-02-03 DIAGNOSIS — Z809 Family history of malignant neoplasm, unspecified: Secondary | ICD-10-CM | POA: Diagnosis not present

## 2020-02-03 DIAGNOSIS — J441 Chronic obstructive pulmonary disease with (acute) exacerbation: Secondary | ICD-10-CM | POA: Diagnosis present

## 2020-02-03 DIAGNOSIS — G20 Parkinson's disease: Secondary | ICD-10-CM | POA: Diagnosis not present

## 2020-02-03 DIAGNOSIS — Z79899 Other long term (current) drug therapy: Secondary | ICD-10-CM

## 2020-02-03 DIAGNOSIS — Z881 Allergy status to other antibiotic agents status: Secondary | ICD-10-CM | POA: Diagnosis not present

## 2020-02-03 DIAGNOSIS — Z8619 Personal history of other infectious and parasitic diseases: Secondary | ICD-10-CM

## 2020-02-03 DIAGNOSIS — R609 Edema, unspecified: Secondary | ICD-10-CM | POA: Diagnosis not present

## 2020-02-03 DIAGNOSIS — T8149XA Infection following a procedure, other surgical site, initial encounter: Secondary | ICD-10-CM | POA: Diagnosis not present

## 2020-02-03 DIAGNOSIS — Z743 Need for continuous supervision: Secondary | ICD-10-CM | POA: Diagnosis not present

## 2020-02-03 DIAGNOSIS — A4101 Sepsis due to Methicillin susceptible Staphylococcus aureus: Secondary | ICD-10-CM | POA: Diagnosis not present

## 2020-02-03 DIAGNOSIS — M4622 Osteomyelitis of vertebra, cervical region: Secondary | ICD-10-CM | POA: Diagnosis not present

## 2020-02-03 DIAGNOSIS — M5440 Lumbago with sciatica, unspecified side: Secondary | ICD-10-CM | POA: Diagnosis not present

## 2020-02-03 DIAGNOSIS — I700 Atherosclerosis of aorta: Secondary | ICD-10-CM | POA: Diagnosis not present

## 2020-02-03 NOTE — ED Triage Notes (Signed)
Pt arrived via [facility] from home. Pt's family called stating pt has not "felt well for a couple days." Pt has a hx of CHF. Pt has increased edema in both legs, ankles and arms. Pt stated he just finished an antibiotic 2 days ago for an infection. EMS stated pt was warm and gave 1,000mg  of tylenol on scene.

## 2020-02-04 ENCOUNTER — Emergency Department (HOSPITAL_COMMUNITY): Payer: Medicare Other

## 2020-02-04 ENCOUNTER — Inpatient Hospital Stay (HOSPITAL_COMMUNITY): Payer: Medicare Other

## 2020-02-04 ENCOUNTER — Encounter (HOSPITAL_COMMUNITY): Payer: Self-pay | Admitting: Family Medicine

## 2020-02-04 DIAGNOSIS — D509 Iron deficiency anemia, unspecified: Secondary | ICD-10-CM | POA: Diagnosis present

## 2020-02-04 DIAGNOSIS — R918 Other nonspecific abnormal finding of lung field: Secondary | ICD-10-CM

## 2020-02-04 DIAGNOSIS — N179 Acute kidney failure, unspecified: Secondary | ICD-10-CM | POA: Diagnosis present

## 2020-02-04 DIAGNOSIS — M4642 Discitis, unspecified, cervical region: Secondary | ICD-10-CM | POA: Diagnosis not present

## 2020-02-04 DIAGNOSIS — R600 Localized edema: Secondary | ICD-10-CM

## 2020-02-04 DIAGNOSIS — K219 Gastro-esophageal reflux disease without esophagitis: Secondary | ICD-10-CM | POA: Diagnosis present

## 2020-02-04 DIAGNOSIS — J439 Emphysema, unspecified: Secondary | ICD-10-CM | POA: Diagnosis present

## 2020-02-04 DIAGNOSIS — M4649 Discitis, unspecified, multiple sites in spine: Secondary | ICD-10-CM | POA: Diagnosis present

## 2020-02-04 DIAGNOSIS — R609 Edema, unspecified: Secondary | ICD-10-CM | POA: Diagnosis not present

## 2020-02-04 DIAGNOSIS — M4644 Discitis, unspecified, thoracic region: Secondary | ICD-10-CM | POA: Diagnosis not present

## 2020-02-04 DIAGNOSIS — Z87891 Personal history of nicotine dependence: Secondary | ICD-10-CM

## 2020-02-04 DIAGNOSIS — R7881 Bacteremia: Secondary | ICD-10-CM | POA: Diagnosis not present

## 2020-02-04 DIAGNOSIS — M1990 Unspecified osteoarthritis, unspecified site: Secondary | ICD-10-CM | POA: Diagnosis present

## 2020-02-04 DIAGNOSIS — G8929 Other chronic pain: Secondary | ICD-10-CM

## 2020-02-04 DIAGNOSIS — M5440 Lumbago with sciatica, unspecified side: Secondary | ICD-10-CM | POA: Diagnosis not present

## 2020-02-04 DIAGNOSIS — R6520 Severe sepsis without septic shock: Secondary | ICD-10-CM

## 2020-02-04 DIAGNOSIS — D649 Anemia, unspecified: Secondary | ICD-10-CM

## 2020-02-04 DIAGNOSIS — R338 Other retention of urine: Secondary | ICD-10-CM | POA: Diagnosis present

## 2020-02-04 DIAGNOSIS — M4643 Discitis, unspecified, cervicothoracic region: Secondary | ICD-10-CM | POA: Diagnosis not present

## 2020-02-04 DIAGNOSIS — Z79891 Long term (current) use of opiate analgesic: Secondary | ICD-10-CM | POA: Diagnosis not present

## 2020-02-04 DIAGNOSIS — M4624 Osteomyelitis of vertebra, thoracic region: Secondary | ICD-10-CM | POA: Diagnosis not present

## 2020-02-04 DIAGNOSIS — R5381 Other malaise: Secondary | ICD-10-CM | POA: Diagnosis present

## 2020-02-04 DIAGNOSIS — Z20822 Contact with and (suspected) exposure to covid-19: Secondary | ICD-10-CM | POA: Diagnosis present

## 2020-02-04 DIAGNOSIS — B9561 Methicillin susceptible Staphylococcus aureus infection as the cause of diseases classified elsewhere: Secondary | ICD-10-CM

## 2020-02-04 DIAGNOSIS — M7989 Other specified soft tissue disorders: Secondary | ICD-10-CM | POA: Diagnosis not present

## 2020-02-04 DIAGNOSIS — N401 Enlarged prostate with lower urinary tract symptoms: Secondary | ICD-10-CM | POA: Diagnosis present

## 2020-02-04 DIAGNOSIS — R937 Abnormal findings on diagnostic imaging of other parts of musculoskeletal system: Secondary | ICD-10-CM | POA: Diagnosis not present

## 2020-02-04 DIAGNOSIS — M2578 Osteophyte, vertebrae: Secondary | ICD-10-CM

## 2020-02-04 DIAGNOSIS — M4620 Osteomyelitis of vertebra, site unspecified: Secondary | ICD-10-CM | POA: Diagnosis not present

## 2020-02-04 DIAGNOSIS — R509 Fever, unspecified: Secondary | ICD-10-CM | POA: Diagnosis not present

## 2020-02-04 DIAGNOSIS — Z881 Allergy status to other antibiotic agents status: Secondary | ICD-10-CM

## 2020-02-04 DIAGNOSIS — A4901 Methicillin susceptible Staphylococcus aureus infection, unspecified site: Secondary | ICD-10-CM | POA: Diagnosis not present

## 2020-02-04 DIAGNOSIS — Z809 Family history of malignant neoplasm, unspecified: Secondary | ICD-10-CM | POA: Diagnosis not present

## 2020-02-04 DIAGNOSIS — D519 Vitamin B12 deficiency anemia, unspecified: Secondary | ICD-10-CM | POA: Diagnosis present

## 2020-02-04 DIAGNOSIS — G062 Extradural and subdural abscess, unspecified: Secondary | ICD-10-CM | POA: Diagnosis not present

## 2020-02-04 DIAGNOSIS — R000 Tachycardia, unspecified: Secondary | ICD-10-CM | POA: Diagnosis present

## 2020-02-04 DIAGNOSIS — R531 Weakness: Secondary | ICD-10-CM | POA: Diagnosis present

## 2020-02-04 DIAGNOSIS — G061 Intraspinal abscess and granuloma: Secondary | ICD-10-CM

## 2020-02-04 DIAGNOSIS — A419 Sepsis, unspecified organism: Secondary | ICD-10-CM | POA: Diagnosis present

## 2020-02-04 DIAGNOSIS — Z66 Do not resuscitate: Secondary | ICD-10-CM | POA: Diagnosis present

## 2020-02-04 DIAGNOSIS — A4101 Sepsis due to Methicillin susceptible Staphylococcus aureus: Secondary | ICD-10-CM | POA: Diagnosis present

## 2020-02-04 DIAGNOSIS — M4623 Osteomyelitis of vertebra, cervicothoracic region: Secondary | ICD-10-CM | POA: Diagnosis not present

## 2020-02-04 DIAGNOSIS — Z79899 Other long term (current) drug therapy: Secondary | ICD-10-CM | POA: Diagnosis not present

## 2020-02-04 DIAGNOSIS — M4622 Osteomyelitis of vertebra, cervical region: Secondary | ICD-10-CM | POA: Diagnosis not present

## 2020-02-04 DIAGNOSIS — Z825 Family history of asthma and other chronic lower respiratory diseases: Secondary | ICD-10-CM | POA: Diagnosis not present

## 2020-02-04 DIAGNOSIS — J449 Chronic obstructive pulmonary disease, unspecified: Secondary | ICD-10-CM

## 2020-02-04 DIAGNOSIS — R32 Unspecified urinary incontinence: Secondary | ICD-10-CM | POA: Diagnosis present

## 2020-02-04 DIAGNOSIS — Z8739 Personal history of other diseases of the musculoskeletal system and connective tissue: Secondary | ICD-10-CM | POA: Diagnosis not present

## 2020-02-04 HISTORY — DX: Osteomyelitis of vertebra, site unspecified: M46.20

## 2020-02-04 LAB — URINALYSIS, ROUTINE W REFLEX MICROSCOPIC
Bacteria, UA: NONE SEEN
Bilirubin Urine: NEGATIVE
Glucose, UA: NEGATIVE mg/dL
Hgb urine dipstick: NEGATIVE
Ketones, ur: NEGATIVE mg/dL
Nitrite: NEGATIVE
Protein, ur: 100 mg/dL — AB
Specific Gravity, Urine: 1.01 (ref 1.005–1.030)
WBC, UA: >50 WBC/hpf — ABNORMAL HIGH (ref 0–5)
pH: 7 (ref 5.0–8.0)

## 2020-02-04 LAB — COMPREHENSIVE METABOLIC PANEL
ALT: 13 U/L (ref 0–44)
AST: 22 U/L (ref 15–41)
Albumin: 2.4 g/dL — ABNORMAL LOW (ref 3.5–5.0)
Alkaline Phosphatase: 73 U/L (ref 38–126)
Anion gap: 12 (ref 5–15)
BUN: 19 mg/dL (ref 8–23)
CO2: 28 mmol/L (ref 22–32)
Calcium: 8.3 mg/dL — ABNORMAL LOW (ref 8.9–10.3)
Chloride: 95 mmol/L — ABNORMAL LOW (ref 98–111)
Creatinine, Ser: 1.38 mg/dL — ABNORMAL HIGH (ref 0.61–1.24)
GFR calc Af Amer: 59 mL/min — ABNORMAL LOW (ref >60)
GFR calc non Af Amer: 51 mL/min — ABNORMAL LOW (ref >60)
Glucose, Bld: 142 mg/dL — ABNORMAL HIGH (ref 70–99)
Potassium: 4.2 mmol/L (ref 3.5–5.1)
Sodium: 135 mmol/L (ref 135–145)
Total Bilirubin: 0.8 mg/dL (ref 0.3–1.2)
Total Protein: 7 g/dL (ref 6.5–8.1)

## 2020-02-04 LAB — CBC WITH DIFFERENTIAL/PLATELET
Abs Immature Granulocytes: 0.07 10*3/uL (ref 0.00–0.07)
Basophils Absolute: 0 10*3/uL (ref 0.0–0.1)
Basophils Relative: 0 %
Eosinophils Absolute: 0.1 10*3/uL (ref 0.0–0.5)
Eosinophils Relative: 0 %
HCT: 28.9 % — ABNORMAL LOW (ref 39.0–52.0)
Hemoglobin: 9.2 g/dL — ABNORMAL LOW (ref 13.0–17.0)
Immature Granulocytes: 1 %
Lymphocytes Relative: 16 %
Lymphs Abs: 2.2 10*3/uL (ref 0.7–4.0)
MCH: 30.5 pg (ref 26.0–34.0)
MCHC: 31.8 g/dL (ref 30.0–36.0)
MCV: 95.7 fL (ref 80.0–100.0)
Monocytes Absolute: 1.5 10*3/uL — ABNORMAL HIGH (ref 0.1–1.0)
Monocytes Relative: 11 %
Neutro Abs: 9.4 10*3/uL — ABNORMAL HIGH (ref 1.7–7.7)
Neutrophils Relative %: 72 %
Platelets: 365 10*3/uL (ref 150–400)
RBC: 3.02 MIL/uL — ABNORMAL LOW (ref 4.22–5.81)
RDW: 13 % (ref 11.5–15.5)
WBC: 13.1 10*3/uL — ABNORMAL HIGH (ref 4.0–10.5)
nRBC: 0 % (ref 0.0–0.2)

## 2020-02-04 LAB — SEDIMENTATION RATE: Sed Rate: 126 mm/hr — ABNORMAL HIGH (ref 0–16)

## 2020-02-04 LAB — TSH: TSH: 3.834 u[IU]/mL (ref 0.350–4.500)

## 2020-02-04 LAB — LACTIC ACID, PLASMA: Lactic Acid, Venous: 0.7 mmol/L (ref 0.5–1.9)

## 2020-02-04 LAB — RESPIRATORY PANEL BY RT PCR (FLU A&B, COVID)
Influenza A by PCR: NEGATIVE
Influenza B by PCR: NEGATIVE
SARS Coronavirus 2 by RT PCR: NEGATIVE

## 2020-02-04 LAB — C-REACTIVE PROTEIN: CRP: 19.2 mg/dL — ABNORMAL HIGH (ref <1.0)

## 2020-02-04 LAB — CREATININE, URINE, RANDOM: Creatinine, Urine: 63.85 mg/dL

## 2020-02-04 LAB — SODIUM, URINE, RANDOM: Sodium, Ur: 49 mmol/L

## 2020-02-04 IMAGING — MR MR THORACIC SPINE WO/W CM
13 of 21 series · 25 of 48 positions shown · IV contrast (gadavist)
Comparison: MRI [DATE]

CLINICAL DATA: MSSA bacteremia. Thoracic epidural abscess status
post laminectomy and IV antibiotics. Fever. Elevated serum
inflammatory markers

EXAM:
MRI CERVICAL AND THORACIC SPINE WITHOUT AND WITH CONTRAST
TECHNIQUE: Multiplanar and multiecho pulse sequences of the cervical spine, to
include the craniocervical junction and cervicothoracic junction,
and thoracic spine, were obtained without and with intravenous
contrast.
CONTRAST:  8mL GADAVIST GADOBUTROL 1 MMOL/ML IV SOLN

[Series 12: T1 · sagittal · 5.0mm · 1.46mm/px · 1 of 9 slices shown (1 of 7)]
[im 1/9]
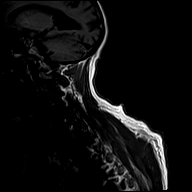

[Series 13: T1 · sagittal · 5.0mm · 1.23mm/px · 1 of 9 slices shown (2 of 7)]
[im 1/9]
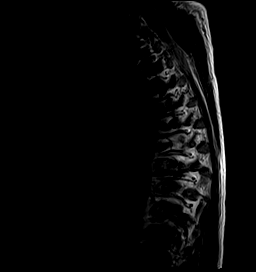

[Series 14: T1 · sagittal · 6.0mm · 1.23mm/px · 1 of 9 slices shown (3 of 7)]
[im 1/9]
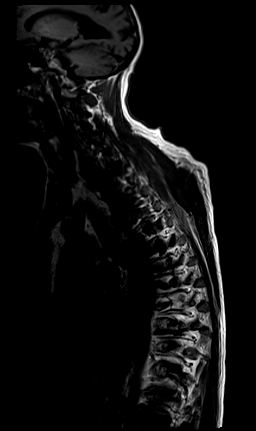

[Series 19: T2 · sagittal · 3.0mm · 0.78mm/px · 1 of 18 slices shown (1 of 4)]
[im 1/18]
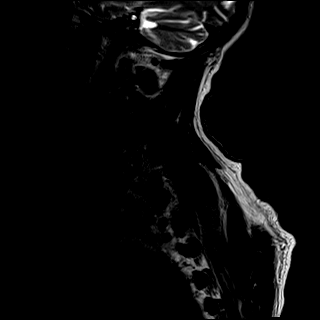

[Series 21: T1 · sagittal · 3.0mm · 0.78mm/px · 1 of 18 slices shown (4 of 7)]
[im 1/18]
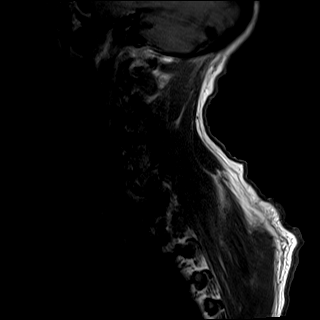

[Series 24: T2 · axial · 3.0mm · 0.66mm/px · z∈[-69,+107]mm · 4 of 60 slices shown (2 of 4)]
[im 1/60]
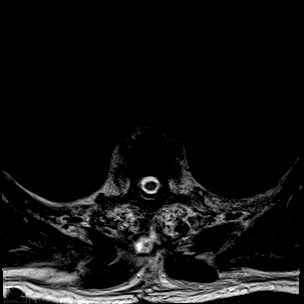
[im 20/60]
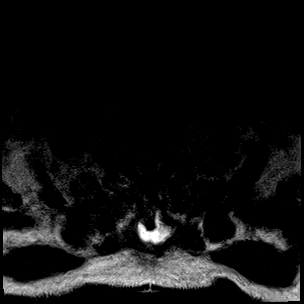
[im 40/60]
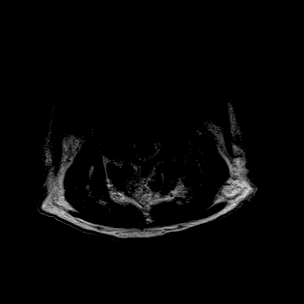
[im 60/60]
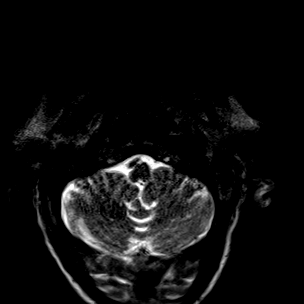

[Series 25: T1 · axial · 3.0mm · 0.39mm/px · z∈[-69,+107]mm · 4 of 60 slices shown (5 of 7)]
[im 1/60]
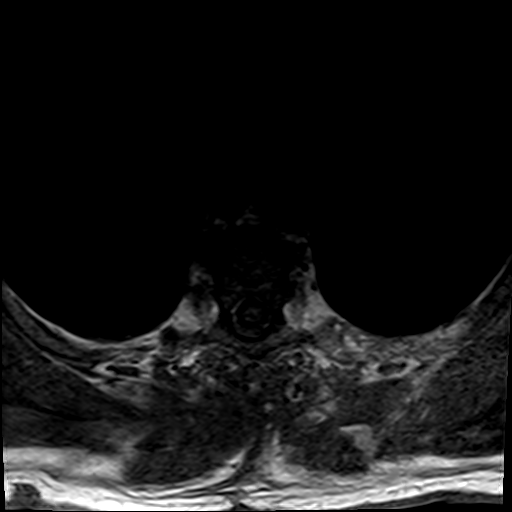
[im 20/60]
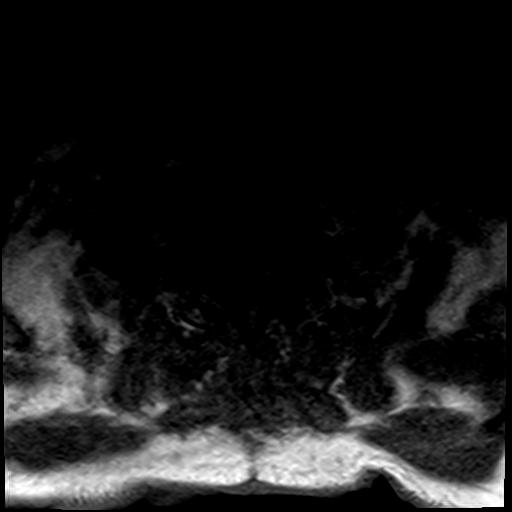
[im 40/60]
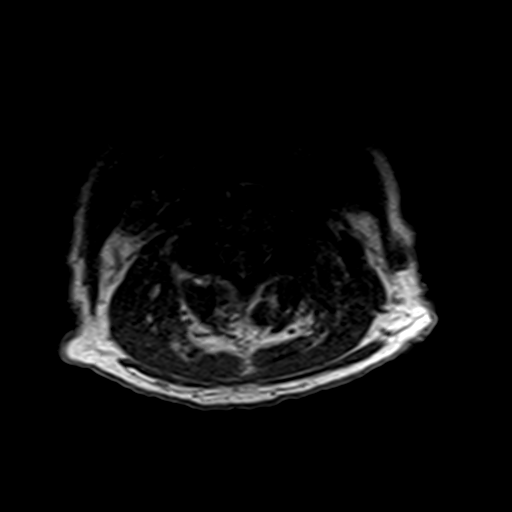
[im 60/60]
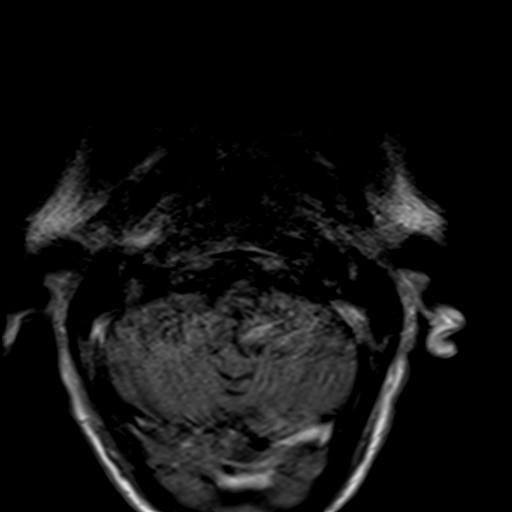

[Series 26: T2 · sagittal · 3.0mm · 0.80mm/px · 1 of 19 slices shown (3 of 4)]
[im 1/19]
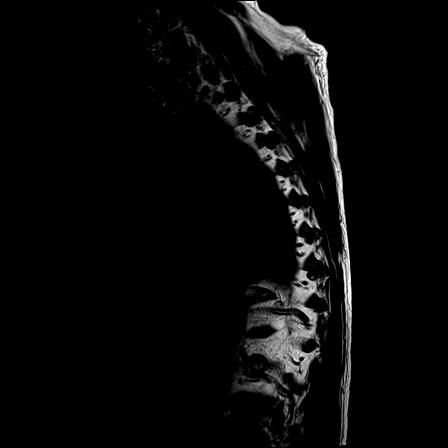

[Series 27: T1 · sagittal · 3.0mm · 0.80mm/px · 1 of 19 slices shown (6 of 7)]
[im 1/19]
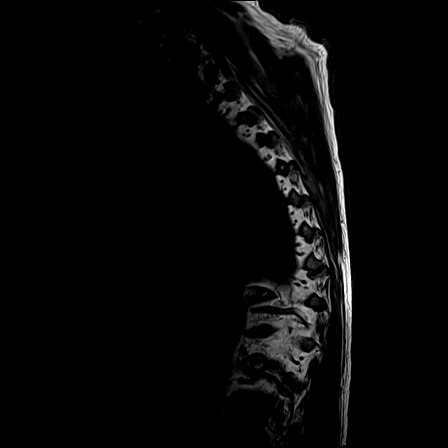

[Series 29: T2 · axial · 4.0mm · 0.59mm/px · z∈[-254,+68]mm · 4 of 55 slices shown (4 of 4)]
[im 1/55]
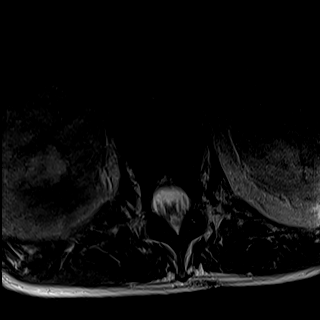
[im 19/55]
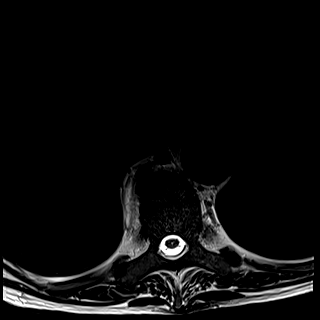
[im 37/55]
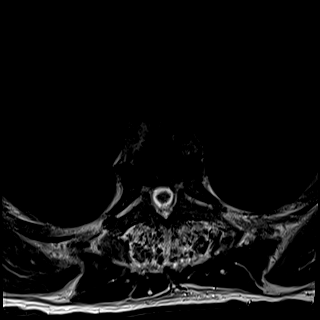
[im 55/55]
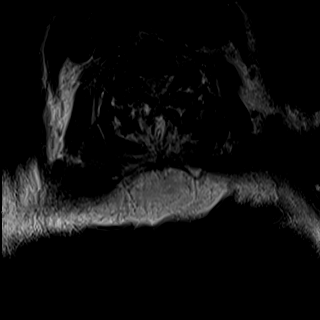

[Series 31: T1 · axial · non-contrast · 4.0mm · 0.31mm/px · z∈[-254,+68]mm · 4 of 55 slices shown (7 of 7)]
[im 1/55]
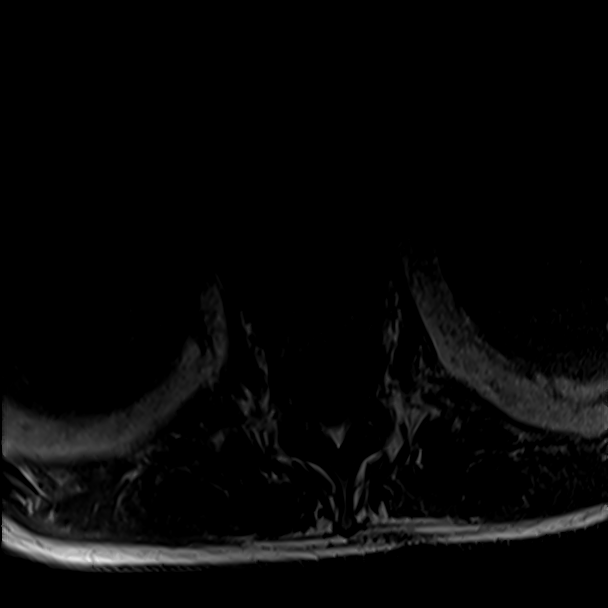
[im 19/55]
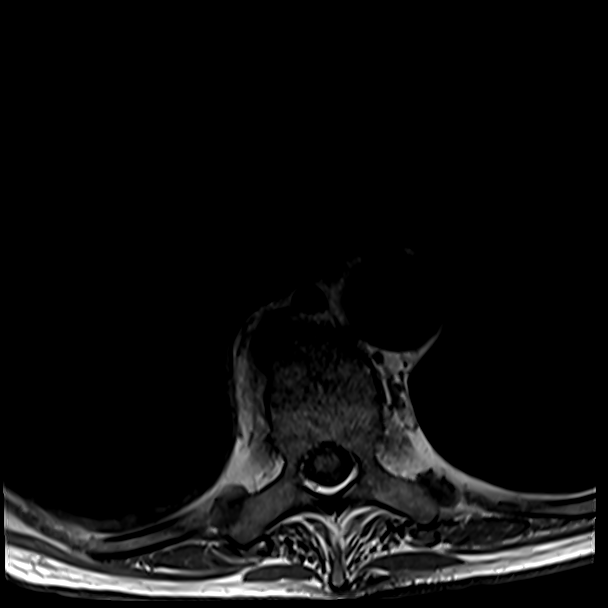
[im 37/55]
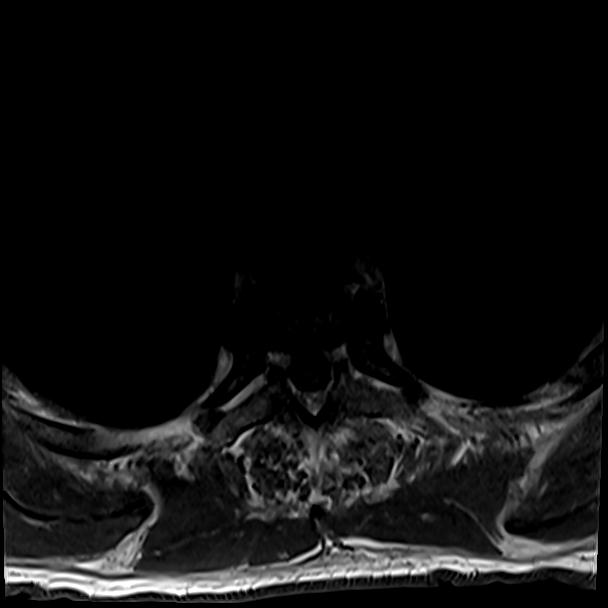
[im 55/55]
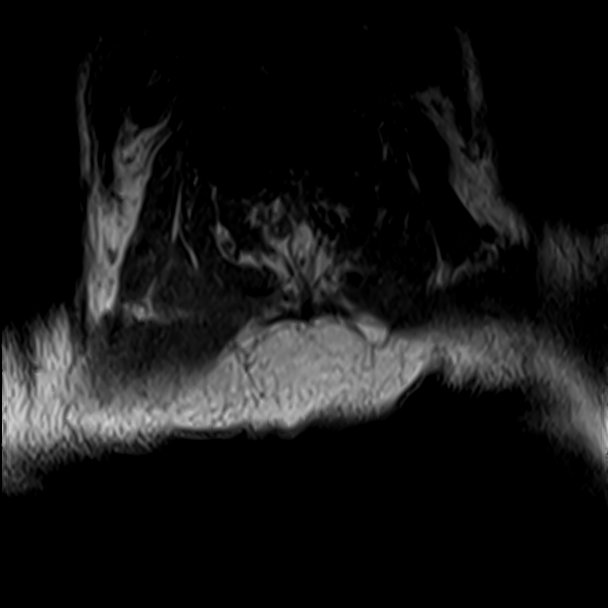

[Series 32: T1 fat-sat post-contrast · sagittal · 3.0mm · 0.49mm/px · 1 of 18 slices shown (1 of 2)]
[im 1/18]
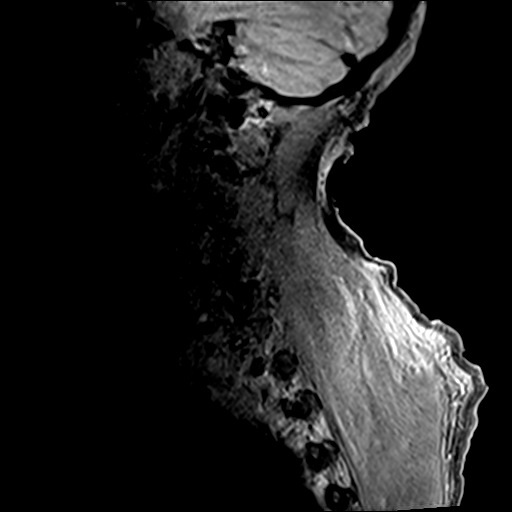

[Series 34: T1 fat-sat post-contrast · sagittal · 3.0mm · 0.78mm/px · 1 of 19 slices shown (2 of 2)]
[im 1/19]
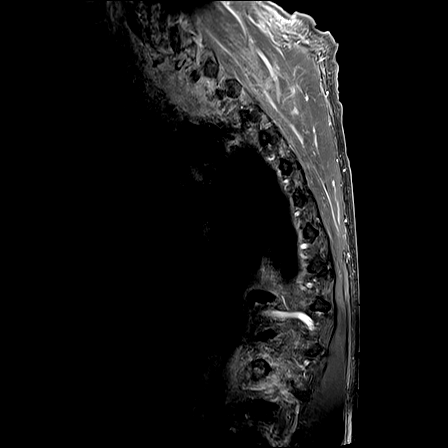

[25 of 48 positions shown; findings below may reference images not displayed]

FINDINGS: Technical note: Motion degraded examination.

MRI CERVICAL SPINE FINDINGS

Alignment: Straightening of the cervical lordosis.

Vertebrae: Fluid signal within the C7-T1 disc space and interval
collapse of disc height with loss of definition of the vertebral
body endplates compatible with discitis. There is low T1 signal
changes with high T2 signal and enhancement within the C7, T1, T2,
and T3 vertebral bodies compatible with osteomyelitis.

Cord: Enhancing material within the epidural space of the lower
cervical spine extending cranially to approximately the C4 level.

Posterior Fossa, vertebral arteries, paraspinal tissues: Extensive
soft tissue edema and enhancement of the posterior paraspinal soft
tissues. There is prevertebral edema and enhancement anterior to the
cervicothoracic junction. Right apical pleuroparenchymal
thickening/scarring.

Disc levels:

Multilevel degenerative changes of the cervical spine including
multiple levels of mild-to-moderate canal stenosis. Canal stenosis
appears moderate at C3-4 and C4-5.

MRI THORACIC SPINE FINDINGS

Alignment:  Physiologic.

Vertebrae:  Osteomyelitis of the T1, T2, and T3 vertebral bodies.

Cord: Enhancing collection in the posterior epidural space extends
to the T5 level (series 34, image 10). Circumferential enhancing
epidural collection at the T2 level extending cranially into the
cervical spine to approximately the C4 level (series 34, image 9).
Enhancing material involves the bilateral neural foramina at T1
through T4. There is prevertebral phlegmonous changes anteriorly
centered at the cervicothoracic junction.

Paraspinal and other soft tissues: Large rim enhancing collection in
the posterior paraspinal soft tissues at laminectomy site in the
upper thoracic spine extending to the posterior epidural space with
enhancing phlegmonous soft tissue extending into the epidural space
of the cervical and thoracic spines. Posterior paraspinal collection
measures up to 4.1 x 3.3 cm trans axially by 8.8 cm cranial
caudally. There is marked surrounding soft tissue edema and
enhancement suggesting cellulitis.

Disc levels:

No high-grade foraminal or canal stenosis of the thoracic spine.
IMPRESSION: 1. C7-T1 discitis-osteomyelitis. There is acute osteomyelitis also
involving the T2 and T3 vertebral bodies.
2. Enhancing epidural phlegmon/abscess extending from approximately
C4 through T5.
3. Rim-enhancing fluid collection at the laminectomy bed abutting
the posterior epidural space. Collection measures approximately 9 x
3 x 4 cm.
4. Anterior prevertebral phlegmonous changes centered at
cervicothoracic junction.

These results will be called to the ordering clinician or
representative by the Radiologist Assistant, and communication
documented in the PACS or zVision Dashboard.

## 2020-02-04 IMAGING — MR MR CERVICAL SPINE WO/W CM
1 series · 18 of 22 positions shown · IV contrast (gadavist)
Comparison: MRI [DATE]

CLINICAL DATA: MSSA bacteremia. Thoracic epidural abscess status
post laminectomy and IV antibiotics. Fever. Elevated serum
inflammatory markers

EXAM:
MRI CERVICAL AND THORACIC SPINE WITHOUT AND WITH CONTRAST
TECHNIQUE: Multiplanar and multiecho pulse sequences of the cervical spine, to
include the craniocervical junction and cervicothoracic junction,
and thoracic spine, were obtained without and with intravenous
contrast.
CONTRAST:  8mL GADAVIST GADOBUTROL 1 MMOL/ML IV SOLN

[Series 37: T1 fat-sat post-contrast · coronal · 5.0mm · 0.62mm/px · 18 of 22 slices shown]
[im 1/22]
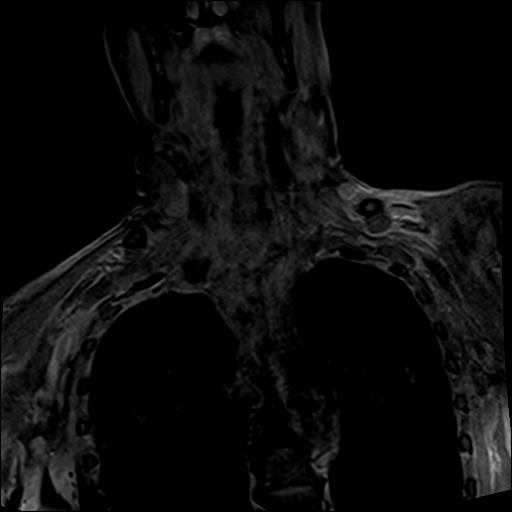
[im 2/22]
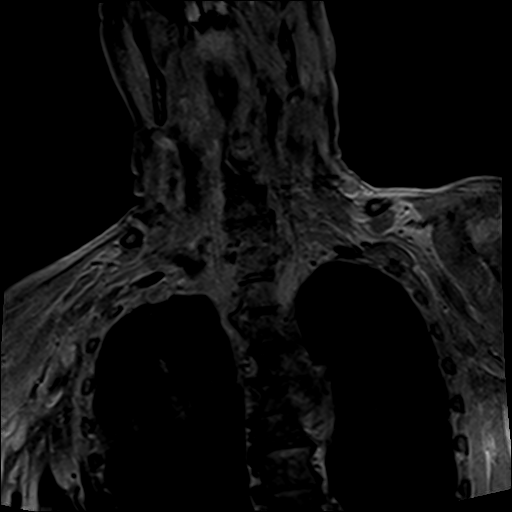
[im 3/22]
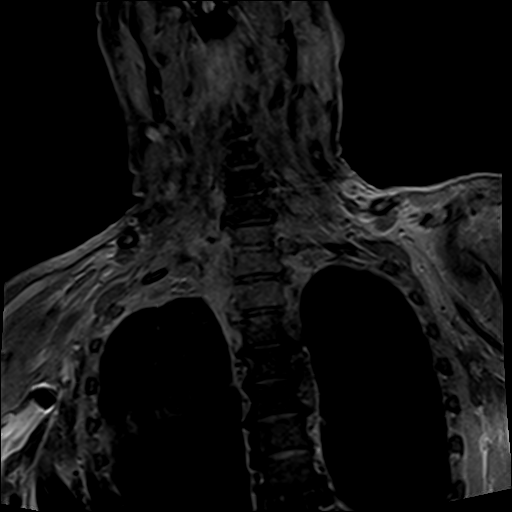
[im 4/22]
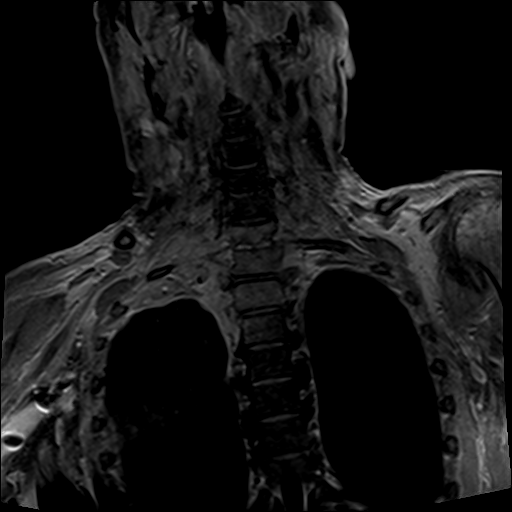
[im 5/22]
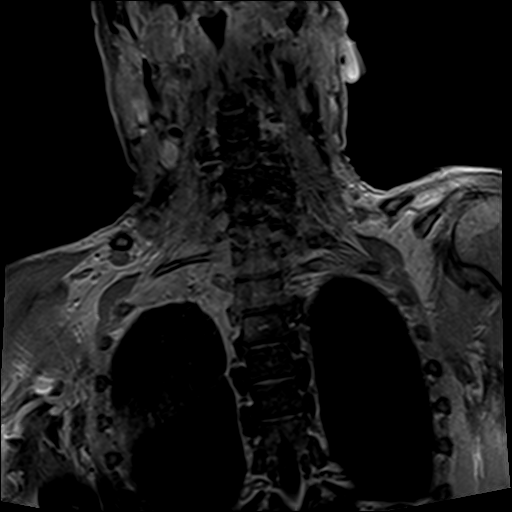
[im 6/22]
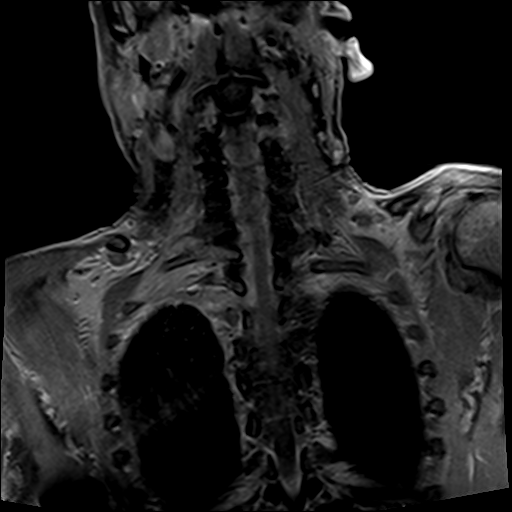
[im 7/22]
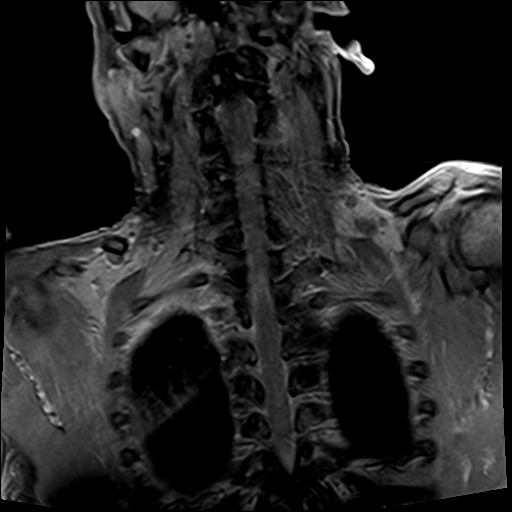
[im 8/22]
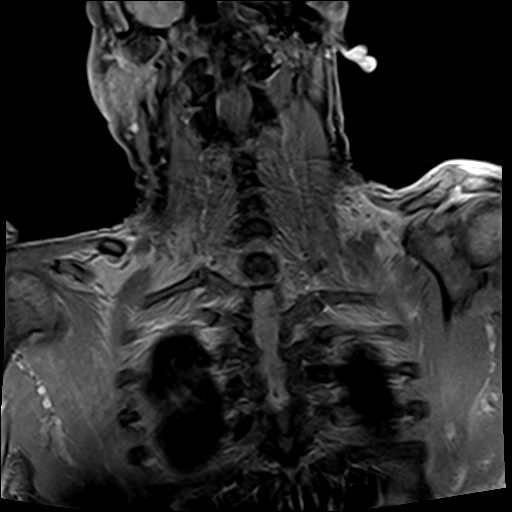
[im 9/22]
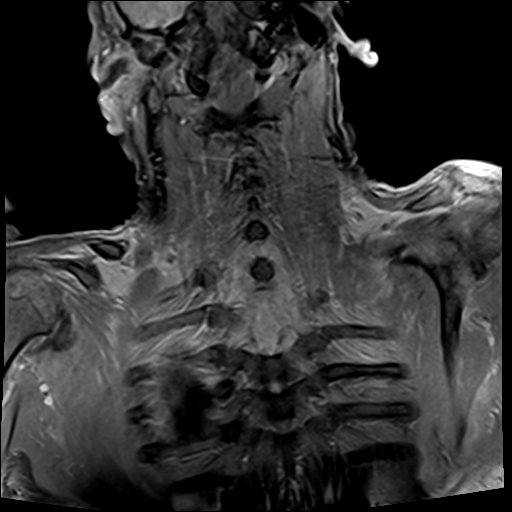
[im 10/22]
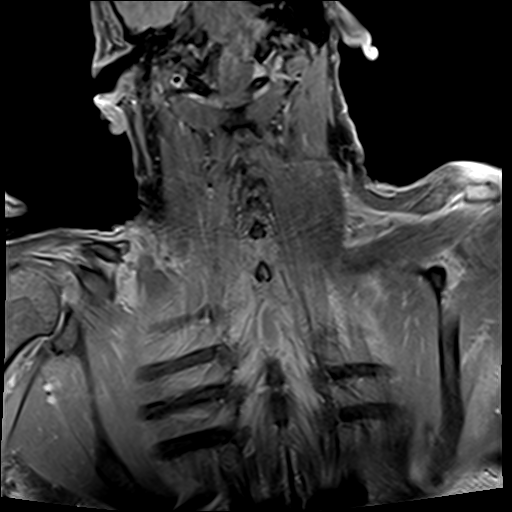
[im 11/22]
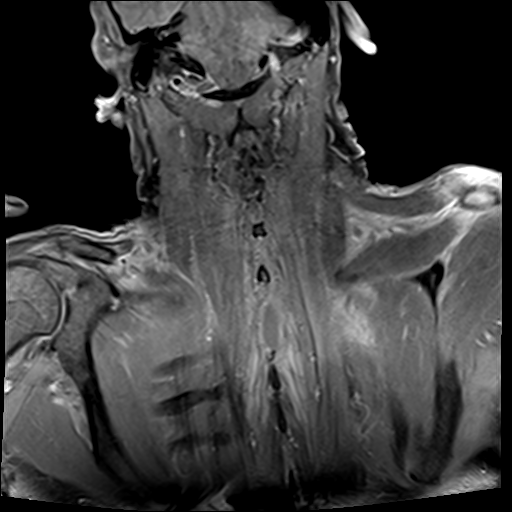
[im 12/22]
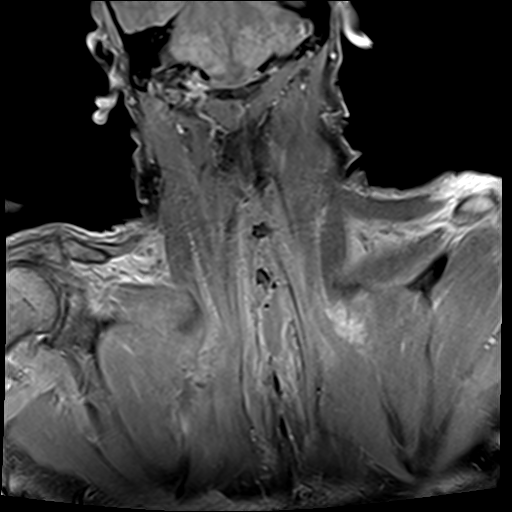
[im 13/22]
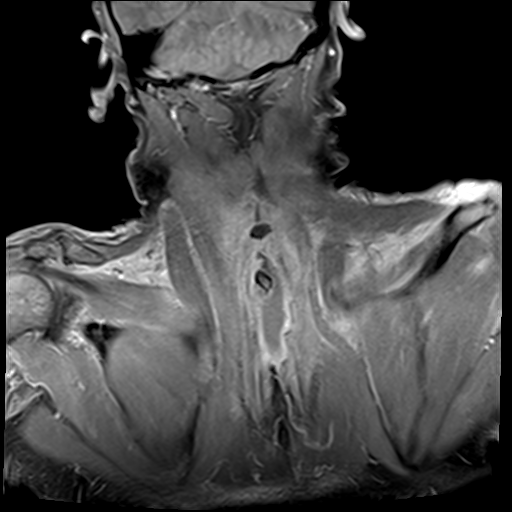
[im 14/22]
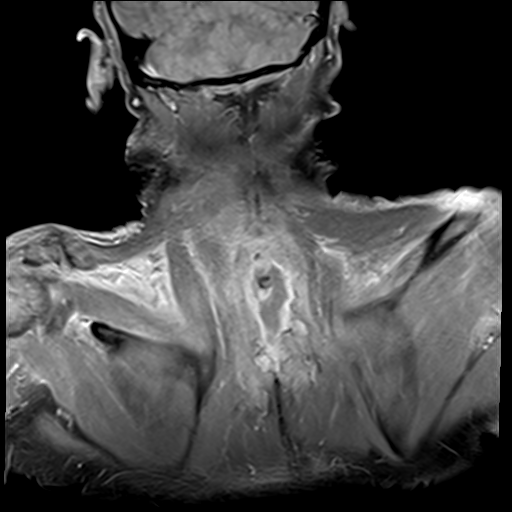
[im 16/22]
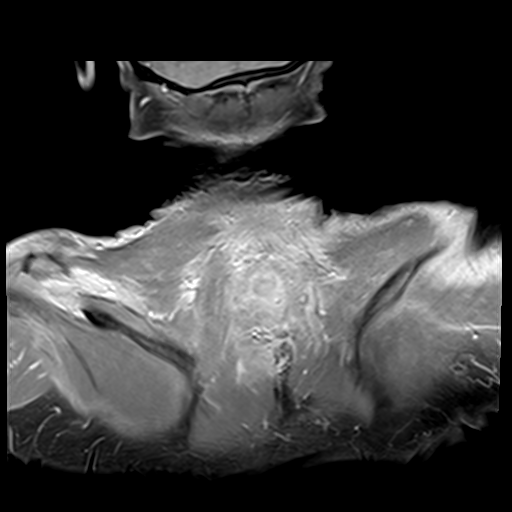
[im 18/22]
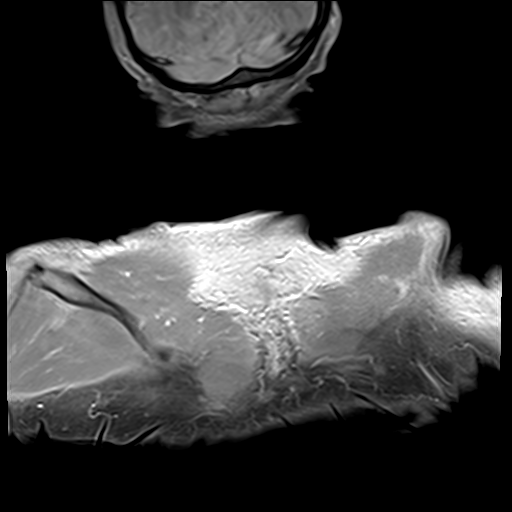
[im 19/22]
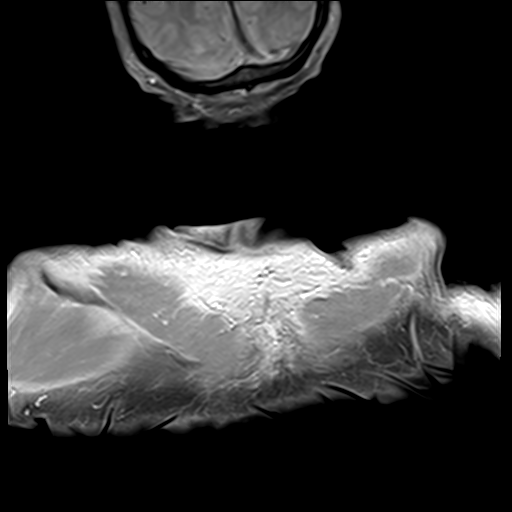
[im 21/22]
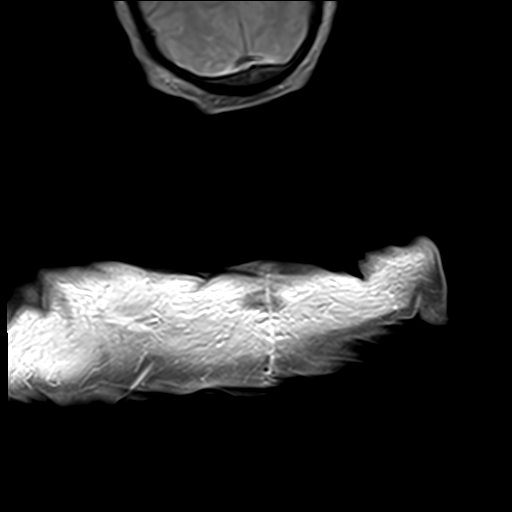

[18 of 22 positions shown; findings below may reference images not displayed]

FINDINGS: Technical note: Motion degraded examination.

MRI CERVICAL SPINE FINDINGS

Alignment: Straightening of the cervical lordosis.

Vertebrae: Fluid signal within the C7-T1 disc space and interval
collapse of disc height with loss of definition of the vertebral
body endplates compatible with discitis. There is low T1 signal
changes with high T2 signal and enhancement within the C7, T1, T2,
and T3 vertebral bodies compatible with osteomyelitis.

Cord: Enhancing material within the epidural space of the lower
cervical spine extending cranially to approximately the C4 level.

Posterior Fossa, vertebral arteries, paraspinal tissues: Extensive
soft tissue edema and enhancement of the posterior paraspinal soft
tissues. There is prevertebral edema and enhancement anterior to the
cervicothoracic junction. Right apical pleuroparenchymal
thickening/scarring.

Disc levels:

Multilevel degenerative changes of the cervical spine including
multiple levels of mild-to-moderate canal stenosis. Canal stenosis
appears moderate at C3-4 and C4-5.

MRI THORACIC SPINE FINDINGS

Alignment:  Physiologic.

Vertebrae:  Osteomyelitis of the T1, T2, and T3 vertebral bodies.

Cord: Enhancing collection in the posterior epidural space extends
to the T5 level (series 34, image 10). Circumferential enhancing
epidural collection at the T2 level extending cranially into the
cervical spine to approximately the C4 level (series 34, image 9).
Enhancing material involves the bilateral neural foramina at T1
through T4. There is prevertebral phlegmonous changes anteriorly
centered at the cervicothoracic junction.

Paraspinal and other soft tissues: Large rim enhancing collection in
the posterior paraspinal soft tissues at laminectomy site in the
upper thoracic spine extending to the posterior epidural space with
enhancing phlegmonous soft tissue extending into the epidural space
of the cervical and thoracic spines. Posterior paraspinal collection
measures up to 4.1 x 3.3 cm trans axially by 8.8 cm cranial
caudally. There is marked surrounding soft tissue edema and
enhancement suggesting cellulitis.

Disc levels:

No high-grade foraminal or canal stenosis of the thoracic spine.
IMPRESSION: 1. C7-T1 discitis-osteomyelitis. There is acute osteomyelitis also
involving the T2 and T3 vertebral bodies.
2. Enhancing epidural phlegmon/abscess extending from approximately
C4 through T5.
3. Rim-enhancing fluid collection at the laminectomy bed abutting
the posterior epidural space. Collection measures approximately 9 x
3 x 4 cm.
4. Anterior prevertebral phlegmonous changes centered at
cervicothoracic junction.

These results will be called to the ordering clinician or
representative by the Radiologist Assistant, and communication
documented in the PACS or zVision Dashboard.

## 2020-02-04 IMAGING — DX DG CHEST 1V PORT
1 series · 1 of 1 positions shown · non-contrast
Comparison: [DATE]

CLINICAL DATA: Fever

EXAM:
PORTABLE CHEST 1 VIEW

[chest]
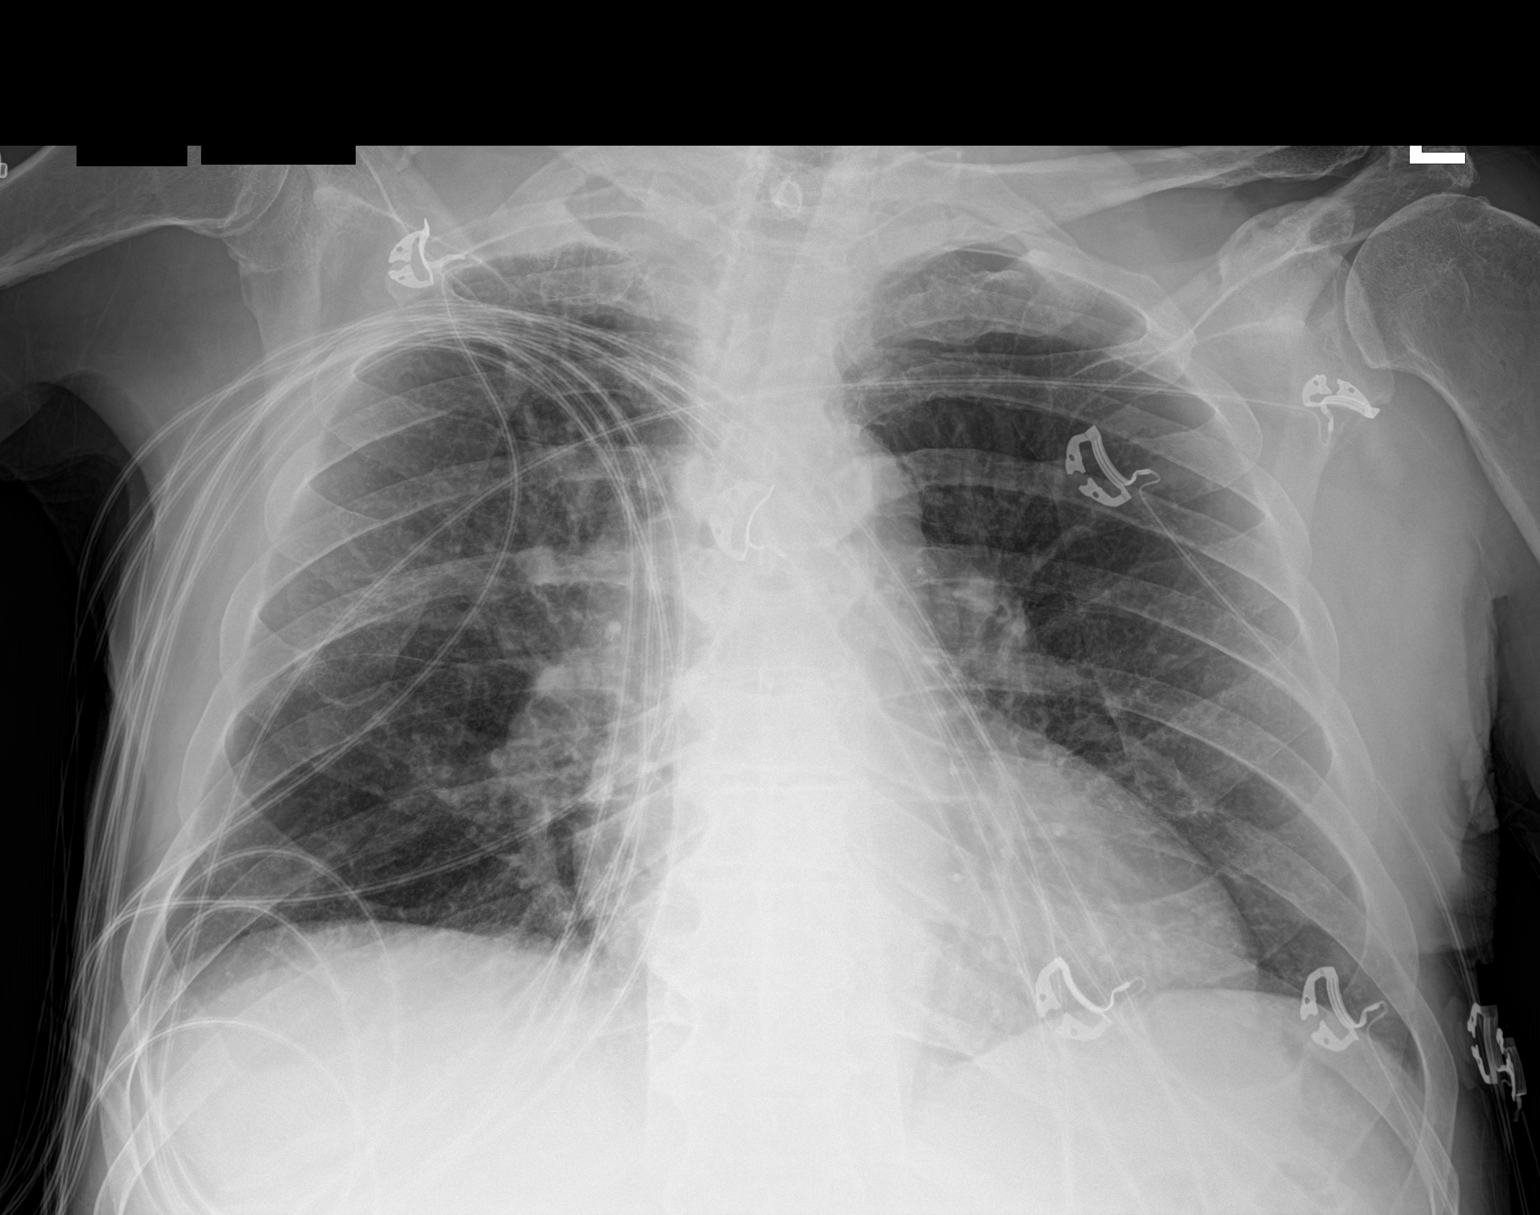

[1 of 1 positions shown; findings below may reference images not displayed]

FINDINGS: The heart size and mediastinal contours are within normal limits.
Both lungs are clear. The visualized skeletal structures are
unremarkable.
IMPRESSION: No active disease.

## 2020-02-04 MED ORDER — PANTOPRAZOLE SODIUM 40 MG PO TBEC
40.0000 mg | DELAYED_RELEASE_TABLET | Freq: Every day | ORAL | Status: DC
  Filled 2020-02-04 (×4): qty 1

## 2020-02-04 MED ORDER — CHLORHEXIDINE GLUCONATE CLOTH 2 % EX PADS: 6.0000 | MEDICATED_PAD | Freq: Every day | CUTANEOUS | Status: DC

## 2020-02-04 MED ORDER — SENNOSIDES-DOCUSATE SODIUM 8.6-50 MG PO TABS: 1.0000 | ORAL_TABLET | Freq: Every evening | ORAL | Status: DC | PRN

## 2020-02-04 MED ORDER — SODIUM CHLORIDE 0.9% FLUSH: 3.0000 mL | INTRAVENOUS | Status: DC | PRN

## 2020-02-04 MED ORDER — ALBUTEROL SULFATE HFA 108 (90 BASE) MCG/ACT IN AERS
2.0000 | INHALATION_SPRAY | Freq: Four times a day (QID) | RESPIRATORY_TRACT | Status: DC | PRN
  Filled 2020-02-04: qty 6.7

## 2020-02-04 MED ORDER — SODIUM CHLORIDE 0.9% FLUSH
3.0000 mL | Freq: Two times a day (BID) | INTRAVENOUS | Status: DC
  Administered 2020-02-04 – 2020-02-05 (×4): 3 mL via INTRAVENOUS

## 2020-02-04 MED ORDER — ADULT MULTIVITAMIN W/MINERALS CH
1.0000 | ORAL_TABLET | Freq: Every day | ORAL | Status: DC
  Administered 2020-02-05 – 2020-02-07 (×3): 1 via ORAL
  Filled 2020-02-04 (×3): qty 1

## 2020-02-04 MED ORDER — GADOBUTROL 1 MMOL/ML IV SOLN
8.0000 mL | Freq: Once | INTRAVENOUS | Status: AC | PRN
  Administered 2020-02-04: 8 mL via INTRAVENOUS

## 2020-02-04 MED ORDER — GABAPENTIN 300 MG PO CAPS
300.0000 mg | ORAL_CAPSULE | Freq: Three times a day (TID) | ORAL | Status: DC
  Administered 2020-02-04 – 2020-02-07 (×9): 300 mg via ORAL
  Filled 2020-02-04 (×9): qty 1

## 2020-02-04 MED ORDER — OXYCODONE HCL 5 MG PO TABS
10.0000 mg | ORAL_TABLET | Freq: Once | ORAL | Status: AC
  Administered 2020-02-04: 01:00:00 10 mg via ORAL
  Filled 2020-02-04: qty 2

## 2020-02-04 MED ORDER — ACETAMINOPHEN 325 MG PO TABS
650.0000 mg | ORAL_TABLET | Freq: Four times a day (QID) | ORAL | Status: DC | PRN
  Administered 2020-02-05 – 2020-02-06 (×2): 650 mg via ORAL
  Filled 2020-02-04 (×2): qty 2

## 2020-02-04 MED ORDER — OXYCODONE HCL 5 MG PO TABS
10.0000 mg | ORAL_TABLET | ORAL | Status: DC
  Administered 2020-02-04 – 2020-02-05 (×7): 10 mg via ORAL
  Filled 2020-02-04 (×7): qty 2

## 2020-02-04 MED ORDER — ONDANSETRON HCL 4 MG PO TABS: 4.0000 mg | ORAL_TABLET | Freq: Four times a day (QID) | ORAL | Status: DC | PRN

## 2020-02-04 MED ORDER — OXYCODONE HCL 5 MG PO TABS
5.0000 mg | ORAL_TABLET | Freq: Four times a day (QID) | ORAL | Status: DC | PRN
  Administered 2020-02-04: 5 mg via ORAL
  Filled 2020-02-04 (×2): qty 1

## 2020-02-04 MED ORDER — FOLIC ACID 1 MG PO TABS
1.0000 mg | ORAL_TABLET | Freq: Every day | ORAL | Status: DC
  Administered 2020-02-04 – 2020-02-07 (×4): 1 mg via ORAL
  Filled 2020-02-04 (×4): qty 1

## 2020-02-04 MED ORDER — GABAPENTIN 300 MG PO CAPS
300.0000 mg | ORAL_CAPSULE | Freq: Once | ORAL | Status: AC
  Administered 2020-02-04: 02:00:00 300 mg via ORAL
  Filled 2020-02-04: qty 1

## 2020-02-04 MED ORDER — OXYCODONE HCL 10 MG PO TABS: 10.0000 mg | ORAL_TABLET | ORAL | Status: DC | PRN

## 2020-02-04 MED ORDER — OXYCODONE HCL 10 MG PO TABS: 10.0000 mg | ORAL_TABLET | ORAL | Status: DC

## 2020-02-04 MED ORDER — ONDANSETRON HCL 4 MG/2ML IJ SOLN: 4.0000 mg | Freq: Four times a day (QID) | INTRAMUSCULAR | Status: DC | PRN

## 2020-02-04 MED ORDER — HEPARIN SODIUM (PORCINE) 5000 UNIT/ML IJ SOLN
5000.0000 [IU] | Freq: Three times a day (TID) | INTRAMUSCULAR | Status: DC
  Administered 2020-02-04 – 2020-02-07 (×8): 5000 [IU] via SUBCUTANEOUS
  Filled 2020-02-04 (×9): qty 1

## 2020-02-04 MED ORDER — SODIUM CHLORIDE 0.9 % IV SOLN: 250.0000 mL | INTRAVENOUS | Status: DC | PRN

## 2020-02-04 MED ORDER — SODIUM CHLORIDE 0.9% FLUSH
3.0000 mL | Freq: Two times a day (BID) | INTRAVENOUS | Status: DC
  Administered 2020-02-04 – 2020-02-06 (×4): 3 mL via INTRAVENOUS

## 2020-02-04 MED ORDER — ALBUTEROL SULFATE HFA 108 (90 BASE) MCG/ACT IN AERS
2.0000 | INHALATION_SPRAY | Freq: Two times a day (BID) | RESPIRATORY_TRACT | Status: DC
  Administered 2020-02-04 – 2020-02-06 (×4): 2 via RESPIRATORY_TRACT
  Filled 2020-02-04: qty 6.7

## 2020-02-04 MED ORDER — ACETAMINOPHEN 650 MG RE SUPP: 650.0000 mg | Freq: Four times a day (QID) | RECTAL | Status: DC | PRN

## 2020-02-04 MED ORDER — ENSURE ENLIVE PO LIQD
237.0000 mL | Freq: Two times a day (BID) | ORAL | Status: DC
  Administered 2020-02-05 – 2020-02-06 (×2): 237 mL via ORAL

## 2020-02-04 MED ORDER — ALBUTEROL SULFATE (2.5 MG/3ML) 0.083% IN NEBU
2.5000 mg | INHALATION_SOLUTION | Freq: Four times a day (QID) | RESPIRATORY_TRACT | Status: DC | PRN
  Administered 2020-02-04: 2.5 mg via RESPIRATORY_TRACT
  Filled 2020-02-04 (×2): qty 3

## 2020-02-04 NOTE — Progress Notes (Signed)
PROGRESS NOTE  REASON HELZER ZES:[id number] DOB: 1946-05-02   PCP: Carylon Perches, NP  Patient is from: home  DOA: 02/03/2020 LOS: 0  Brief Narrative / Interim history: 74 year old male with history of COPD, right apical lung mass, chronic pain on chronic opiate, recent hospitalization for MSSA bacteremia and epidural abscess who completed antibiotic course on 01/28/2020 returning today with fever and general malaise.  In ED, febrile to 38.6 C.  Slightly tachycardic.  Soft blood pressures but within normal.  Creatinine 1.38 (1.0 about a week ago).  Covid PCR negative.  CRP 19.2.  LA 0.7.  WBC 13K.  Hgb 9.2.  Blood and urine cultures collected.  ID consulted.  MRI thoracic spine ordered and hospitalist service was called for admission.  Patient is admitted with working diagnosis of sepsis likely from cervicothoracic infection.  ID recommended holding off antibiotics pending tissue cultures.   MRI cervical and thoracic spine revealed C7-T1 discitis/osteomyelitis, T2 and T3 vertebral body osteomyelitis, enhancing epidural phlegmon/abscess extending from approximately T4-T5, rim-enhancing fluid collection at the laminectomy bed abutting the posterior epidural space measuring 9 x 3 x 4 cm and anterior prevertebral phlegmonous changes centered at the cervicothoracic junction.  Neurosurgery, Dr. Ronnald Ramp consulted and recommended IR drainage of the fluid. However, IR suggested neurosurgery to drain the epidural abscess. I have reconsulted neurosurgery again.   Subjective: Patient complains of back pain from his neck to lower back.  Pain is chronic.  He is on oxycodone 10 mg every 4 hours at home.  Has been getting only 5 mg every 6 hours here.  Denies new focal neuro symptoms such as numbness, tingling or weakness.  Denies bowel or bladder issue.  Objective: Vitals:   02/04/20 0345 02/04/20 0630 02/04/20 0923 02/04/20 1101  BP: (!) 135/102 139/65 131/68   Pulse: 80 85 83   Resp: '14 15 17   '$ Temp:    98.7 F (37.1 C)   TempSrc:   Oral   SpO2: 97% 95% 95% 96%    Intake/Output Summary (Last 24 hours) at 02/04/2020 1152 Last data filed at 02/04/2020 0915 Gross per 24 hour  Intake 6 ml  Output --  Net 6 ml   There were no vitals filed for this visit.  Examination:  GENERAL: No acute distress.  Appears well.  HEENT: MMM.  Vision and hearing grossly intact.  NECK: Supple.  No apparent JVD.  RESP:  No IWOB. Good air movement bilaterally. CVS:  RRR. Heart sounds normal.  ABD/GI/GU: Bowel sounds present. Soft. Non tender.  MSK/EXT:  Moves extremities. No apparent deformity.  Significant edema in both LEs, Rt > Lt SKIN: Bullae over the lateral aspect of LLE NEURO: Awake, alert and oriented appropriately.  No apparent focal neuro deficit other than inability to form a grip with right hand. PSYCH: Calm. Normal affect.   Procedures:  MRI cervical and thoracic spine C7-T1 discitis/osteomyelitis, T2 and T3 vertebral body osteomyelitis, enhancing epidural phlegmon/abscess extending from approximately T4-T5, rim-enhancing fluid collection at the laminectomy bed abutting the posterior epidural space measuring 9 x 3 x 4 cm and anterior prevertebral phlegmonous changes centered at the cervicothoracic junction.  Assessment & Plan: Sepsis due to cervicothoracic discitis/osteomyelitis/abscess as noted in MRI above-hemodynamically stable.  CRP and ESR elevated.  No new focal neurological deficit. -ID, neurosurgery and IR consulted -ID-tissue sample before initiating antibiotic.  NS- IR consult for drainage.  IR defers to NS. NS reconsulted. -Pain control with home oxycodone -Bowel regimen -Follow cultures  Bilateral extremity edema: Basically  normal echo on 12/19/2019.  Bilateral LE Doppler negative for DVT.  Albumin 2.4.  TSH normal.  Hgb 9.2. Patient has concerning right apical lung mass.  -Hold off diuretics for now given AKI. -Leg elevation -TED hose if he tolerates.  Chronic COPD:  Stable -As needed breathing treatments  Right apical lung mass: Scheduled for PET scan on 02/10/2019. -Outpatient follow-up  AKI: Baseline Cr 0.6-0.8> 1.03 (2/8)> 1.38 (admit).  BUN within normal.  On low-dose enalapril and Demadex -Hold home Demadex and enalapril. -Continue monitoring  Normocytic anemia: Baseline Hgb 10-12> 9.2 (admit)> -Check anemia panel -Continue monitoring  Chronic pain on chronic opiates -Resume home oxycodone. -Bowel regimen  GERD: -PPI               DVT prophylaxis: Subcu heparin Code Status: DNR/DNI Family Communication: Patient and/or RN. Available if any question.  Discharge barrier: Sepsis/discitis/osteomyelitis/epidural abscess Patient is from: Home Final disposition: To be determined  Consultants: ID, neurosurgery, IR   Microbiology summarized: [id number] negative Blood culture pending Urine culture pending   Sch Meds:  Scheduled Meds: . albuterol  2 puff Inhalation BID  . oxyCODONE  10 mg Oral Q4H while awake  . sodium chloride flush  3 mL Intravenous Q12H  . sodium chloride flush  3 mL Intravenous Q12H   Continuous Infusions: . sodium chloride     PRN Meds:.sodium chloride, acetaminophen **OR** acetaminophen, albuterol, ondansetron **OR** ondansetron (ZOFRAN) IV, senna-docusate, sodium chloride flush  Antimicrobials: Anti-infectives (From admission, onward)   None       I have personally reviewed the following labs and images: CBC: Recent Labs  Lab 02/04/20 0128  WBC 13.1*  NEUTROABS 9.4*  HGB 9.2*  HCT 28.9*  MCV 95.7  PLT 365   BMP &GFR Recent Labs  Lab 02/04/20 0128  NA 135  K 4.2  CL 95*  CO2 28  GLUCOSE 142*  BUN 19  CREATININE 1.38*  CALCIUM 8.3*   Estimated Creatinine Clearance: 50.8 mL/min (A) (by C-G formula based on SCr of 1.38 mg/dL (H)). Liver & Pancreas: Recent Labs  Lab 02/04/20 0128  AST 22  ALT 13  ALKPHOS 73  BILITOT 0.8  PROT 7.0  ALBUMIN 2.4*   No results for  input(s): LIPASE, AMYLASE in the last 168 hours. No results for input(s): AMMONIA in the last 168 hours. Diabetic: No results for input(s): HGBA1C in the last 72 hours. No results for input(s): GLUCAP in the last 168 hours. Cardiac Enzymes: No results for input(s): CKTOTAL, CKMB, CKMBINDEX, TROPONINI in the last 168 hours. No results for input(s): PROBNP in the last 8760 hours. Coagulation Profile: No results for input(s): INR, PROTIME in the last 168 hours. Thyroid Function Tests: No results for input(s): TSH, T4TOTAL, FREET4, T3FREE, THYROIDAB in the last 72 hours. Lipid Profile: No results for input(s): CHOL, HDL, LDLCALC, TRIG, CHOLHDL, LDLDIRECT in the last 72 hours. Anemia Panel: No results for input(s): VITAMINB12, FOLATE, FERRITIN, TIBC, IRON, RETICCTPCT in the last 72 hours. Urine analysis:    Component Value Date/Time   COLORURINE YELLOW 02/04/2020 0151   APPEARANCEUR CLOUDY (A) 02/04/2020 0151   LABSPEC 1.010 02/04/2020 0151   PHURINE 7.0 02/04/2020 0151   GLUCOSEU NEGATIVE 02/04/2020 0151   HGBUR NEGATIVE 02/04/2020 0151   BILIRUBINUR NEGATIVE 02/04/2020 0151   KETONESUR NEGATIVE 02/04/2020 0151   PROTEINUR 100 (A) 02/04/2020 0151   NITRITE NEGATIVE 02/04/2020 0151   LEUKOCYTESUR LARGE (A) 02/04/2020 0151   Sepsis Labs: Invalid input(s): PROCALCITONIN, [place]  Microbiology: Recent Results (  from the past 240 hour(s))  Respiratory Panel by RT PCR (Flu A&B, Covid) - Urine, Clean Catch     Status: None   Collection Time: 02/04/20  1:36 AM   Specimen: Urine, Clean Catch  Result Value Ref Range Status   SARS Coronavirus 2 by RT PCR NEGATIVE NEGATIVE Final    Comment: (NOTE) SARS-CoV-2 target nucleic acids are NOT DETECTED. The SARS-CoV-2 RNA is generally detectable in upper respiratoy specimens during the acute phase of infection. The lowest concentration of SARS-CoV-2 viral copies this assay can detect is 131 copies/mL. A negative result does not preclude  SARS-Cov-2 infection and should not be used as the sole basis for treatment or other patient management decisions. A negative result may occur with  improper specimen collection/handling, submission of specimen other than nasopharyngeal swab, presence of viral mutation(s) within the areas targeted by this assay, and inadequate number of viral copies (<131 copies/mL). A negative result must be combined with clinical observations, patient history, and epidemiological information. The expected result is Negative. Fact Sheet for Patients:  PinkCheek.be Fact Sheet for Healthcare Providers:  GravelBags.it This test is not yet ap proved or cleared by the [place] FDA and  has been authorized for detection and/or diagnosis of SARS-CoV-2 by FDA under an Emergency Use Authorization (EUA). This EUA will remain  in effect (meaning this test can be used) for the duration of the COVID-19 declaration under Section 564(b)(1) of the Act, 21 U.S.C. section 360bbb-3(b)(1), unless the authorization is terminated or revoked sooner.    Influenza A by PCR NEGATIVE NEGATIVE Final   Influenza B by PCR NEGATIVE NEGATIVE Final    Comment: (NOTE) The Xpert Xpress SARS-CoV-2/FLU/RSV assay is intended as an aid in  the diagnosis of influenza from Nasopharyngeal swab specimens and  should not be used as a sole basis for treatment. Nasal washings and  aspirates are unacceptable for Xpert Xpress SARS-CoV-2/FLU/RSV  testing. Fact Sheet for Patients: PinkCheek.be Fact Sheet for Healthcare Providers: GravelBags.it This test is not yet approved or cleared by the [place] FDA and  has been authorized for detection and/or diagnosis of SARS-CoV-2 by  FDA under an Emergency Use Authorization (EUA). This EUA will remain  in effect (meaning this test can be used) for the duration of the  Covid-19  declaration under Section 564(b)(1) of the Act, 21  U.S.C. section 360bbb-3(b)(1), unless the authorization is  terminated or revoked. Performed at [facility], [place] [address] [place], [place] [address]     Radiology Studies: MR CERVICAL SPINE W WO CONTRAST  Result Date: 02/04/2020 CLINICAL DATA:  MSSA bacteremia. Thoracic epidural abscess status post laminectomy and IV antibiotics. Fever. Elevated serum inflammatory markers EXAM: MRI CERVICAL AND THORACIC SPINE WITHOUT AND WITH CONTRAST TECHNIQUE: Multiplanar and multiecho pulse sequences of the cervical spine, to include the craniocervical junction and cervicothoracic junction, and thoracic spine, were obtained without and with intravenous contrast. CONTRAST:  1m GADAVIST GADOBUTROL 1 MMOL/ML IV SOLN COMPARISON:  MRI 12/18/2019 FINDINGS: Technical note: Motion degraded examination. MRI CERVICAL SPINE FINDINGS Alignment: Straightening of the cervical lordosis. Vertebrae: Fluid signal within the C7-T1 disc space and interval collapse of disc height with loss of definition of the vertebral body endplates compatible with discitis. There is low T1 signal changes with high T2 signal and enhancement within the C7, T1, T2, and T3 vertebral bodies compatible with osteomyelitis. Cord: Enhancing material within the epidural space of the lower cervical spine extending cranially to approximately the C4 level. Posterior  Fossa, vertebral arteries, paraspinal tissues: Extensive soft tissue edema and enhancement of the posterior paraspinal soft tissues. There is prevertebral edema and enhancement anterior to the cervicothoracic junction. Right apical pleuroparenchymal thickening/scarring. Disc levels: Multilevel degenerative changes of the cervical spine including multiple levels of mild-to-moderate canal stenosis. Canal stenosis appears moderate at C3-4 and C4-5. MRI THORACIC SPINE FINDINGS Alignment:  Physiologic. Vertebrae:  Osteomyelitis of the T1, T2, and  T3 vertebral bodies. Cord: Enhancing collection in the posterior epidural space extends to the T5 level (series 34, image 10). Circumferential enhancing epidural collection at the T2 level extending cranially into the cervical spine to approximately the C4 level (series 34, image 9). Enhancing material involves the bilateral neural foramina at T1 through T4. There is prevertebral phlegmonous changes anteriorly centered at the cervicothoracic junction. Paraspinal and other soft tissues: Large rim enhancing collection in the posterior paraspinal soft tissues at laminectomy site in the upper thoracic spine extending to the posterior epidural space with enhancing phlegmonous soft tissue extending into the epidural space of the cervical and thoracic spines. Posterior paraspinal collection measures up to 4.1 x 3.3 cm trans axially by 8.8 cm cranial caudally. There is marked surrounding soft tissue edema and enhancement suggesting cellulitis. Disc levels: No high-grade foraminal or canal stenosis of the thoracic spine. IMPRESSION: 1. C7-T1 discitis-osteomyelitis. There is acute osteomyelitis also involving the T2 and T3 vertebral bodies. 2. Enhancing epidural phlegmon/abscess extending from approximately C4 through T5. 3. Rim-enhancing fluid collection at the laminectomy bed abutting the posterior epidural space. Collection measures approximately 9 x 3 x 4 cm. 4. Anterior prevertebral phlegmonous changes centered at cervicothoracic junction. These results will be called to the ordering clinician or representative by the Radiologist Assistant, and communication documented in the PACS or zVision Dashboard. Electronically Signed   By: Davina Poke D.O.   On: 02/04/2020 09:42   MR THORACIC SPINE W WO CONTRAST  Result Date: 02/04/2020 CLINICAL DATA:  MSSA bacteremia. Thoracic epidural abscess status post laminectomy and IV antibiotics. Fever. Elevated serum inflammatory markers EXAM: MRI CERVICAL AND THORACIC SPINE  WITHOUT AND WITH CONTRAST TECHNIQUE: Multiplanar and multiecho pulse sequences of the cervical spine, to include the craniocervical junction and cervicothoracic junction, and thoracic spine, were obtained without and with intravenous contrast. CONTRAST:  28m GADAVIST GADOBUTROL 1 MMOL/ML IV SOLN COMPARISON:  MRI 12/18/2019 FINDINGS: Technical note: Motion degraded examination. MRI CERVICAL SPINE FINDINGS Alignment: Straightening of the cervical lordosis. Vertebrae: Fluid signal within the C7-T1 disc space and interval collapse of disc height with loss of definition of the vertebral body endplates compatible with discitis. There is low T1 signal changes with high T2 signal and enhancement within the C7, T1, T2, and T3 vertebral bodies compatible with osteomyelitis. Cord: Enhancing material within the epidural space of the lower cervical spine extending cranially to approximately the C4 level. Posterior Fossa, vertebral arteries, paraspinal tissues: Extensive soft tissue edema and enhancement of the posterior paraspinal soft tissues. There is prevertebral edema and enhancement anterior to the cervicothoracic junction. Right apical pleuroparenchymal thickening/scarring. Disc levels: Multilevel degenerative changes of the cervical spine including multiple levels of mild-to-moderate canal stenosis. Canal stenosis appears moderate at C3-4 and C4-5. MRI THORACIC SPINE FINDINGS Alignment:  Physiologic. Vertebrae:  Osteomyelitis of the T1, T2, and T3 vertebral bodies. Cord: Enhancing collection in the posterior epidural space extends to the T5 level (series 34, image 10). Circumferential enhancing epidural collection at the T2 level extending cranially into the cervical spine to approximately the C4 level (series 34, image 9).  Enhancing material involves the bilateral neural foramina at T1 through T4. There is prevertebral phlegmonous changes anteriorly centered at the cervicothoracic junction. Paraspinal and other soft  tissues: Large rim enhancing collection in the posterior paraspinal soft tissues at laminectomy site in the upper thoracic spine extending to the posterior epidural space with enhancing phlegmonous soft tissue extending into the epidural space of the cervical and thoracic spines. Posterior paraspinal collection measures up to 4.1 x 3.3 cm trans axially by 8.8 cm cranial caudally. There is marked surrounding soft tissue edema and enhancement suggesting cellulitis. Disc levels: No high-grade foraminal or canal stenosis of the thoracic spine. IMPRESSION: 1. C7-T1 discitis-osteomyelitis. There is acute osteomyelitis also involving the T2 and T3 vertebral bodies. 2. Enhancing epidural phlegmon/abscess extending from approximately C4 through T5. 3. Rim-enhancing fluid collection at the laminectomy bed abutting the posterior epidural space. Collection measures approximately 9 x 3 x 4 cm. 4. Anterior prevertebral phlegmonous changes centered at cervicothoracic junction. These results will be called to the ordering clinician or representative by the Radiologist Assistant, and communication documented in the PACS or zVision Dashboard. Electronically Signed   By: Davina Poke D.O.   On: 02/04/2020 09:42   DG Chest Port 1 View  Result Date: 02/04/2020 CLINICAL DATA:  Fever EXAM: PORTABLE CHEST 1 VIEW COMPARISON:  December 21, 2019 FINDINGS: The heart size and mediastinal contours are within normal limits. Both lungs are clear. The visualized skeletal structures are unremarkable. IMPRESSION: No active disease. Electronically Signed   By: Constance Holster M.D.   On: 02/04/2020 00:39    35 minutes with more than 50% spent in reviewing records, counseling patient/family and coordinating care.   Thaddeus Evitts T. [place]  If 7PM-7AM, please contact night-coverage [email or web address] Password [facility] 02/04/2020, 11:52 AM

## 2020-02-04 NOTE — H&P (Signed)
History and Physical    Kent Flowers [id number] DOB: 09-30-1946 DOA: 02/03/2020  PCP: Carylon Perches, NP   Patient coming from: Home   Chief Complaint: Fever, malaise   HPI: Kent Flowers is a 74 y.o. male with medical history significant for COPD, right apical lung mass scheduled for PET scan in 1 week, recent leg swelling started on Lasix, and recent hospital admission with thoracic epidural abscess and MSSA bacteremia for which he completed a course of cefazolin on 01/28/2020, now presenting to the ED for evaluation of fevers and general malaise.  Patient reports that he had been doing well until yesterday when he developed a fever.  He denies any recent cough, dysuria, abdominal pain, neck stiffness, diarrhea, or rash.  He has not noted any significant worsening in his chronic back pain.  Patient states that he preferred to stay home, but his wife called EMS and encouraged him to be evaluated in the ED.  ED Course: Upon arrival to the ED, patient is found to be febrile to 38.6 C, saturating low 90s on room air, slightly tachycardic initially, and with blood pressure 104/65.  EKG features a sinus rhythm and chest x-ray negative for acute cardiopulmonary disease.  Chemistry panel notable for creatinine 1.38, up from 1 a week ago.  Albumin is 2.4.  Covid PCR is negative.  CRP is elevated to 19.2 and lactic acid is normal at 0.7.  CBC features a leukocytosis to 13,100 and normocytic anemia with hemoglobin 9.2.  Blood and urine cultures were collected in the ED and infectious disease was consulted by the ED physician, recommending to hold antibiotics initially, obtain MRI thoracic spine, and admit to the hospitalist service.  Review of Systems:  All other systems reviewed and apart from HPI, are negative.  Past Medical History:  Diagnosis Date  . Benign localized prostatic hyperplasia with lower urinary tract symptoms (LUTS)   . Chronic low back pain   . COPD (chronic obstructive  pulmonary disease) with emphysema (HCC)    PULMOLOGIST-- DR Tarri Fuller YOUNG  . 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 ED (erectile dysfunction)   . Full dentures   . Gross hematuria   . 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 Hypogonadism male   . Knee pain, bilateral    INTERMITTENT--  MENISCUS  . OA (osteoarthritis)    KNEES  . Peyronie's disease   . Pulmonary nodules followed by dr c. young (pulmologist)   LLL and LUL-- per last CT 10-01-2013  stable and previous right lung nodule not seen  . Thoracic ascending aortic aneurysm (HCC)    STABLE PER LAST CT 10-01-2013  4CM--  ECTATIC   . Urethral stricture   . Urgency of urination   . Wears glasses     Past Surgical History:  Procedure Laterality Date  . CYSTOSCOPY WITH BIOPSY N/A 04/10/2017   Procedure: POSSIBLE BLADDER  BIOPSY;  Surgeon: Festus Aloe, MD;  Location: [facility];  Service: Urology;  Laterality: N/A;  . CYSTOSCOPY WITH URETHRAL DILATATION N/A 04/10/2017   Procedure: CYSTOSCOPY WITH BALLOON URETHRALSTRICTURE DILATATION;  Surgeon: Festus Aloe, MD;  Location: [facility];  Service: Urology;  Laterality: N/A;  . PENILE BIOPSY N/A 03/14/2013   Procedure: PENILE BIOPSY;  Surgeon: Fredricka Bonine, MD;  Location: [facility];  Service: Urology;  Laterality: N/A;  .  REATTACHMENT LEFT INDEX FINGER  1988  . SHOULDER ARTHROSCOPY WITH ROTATOR CUFF REPAIR AND SUBACROMIAL DECOMPRESSION Left 2004  . THORACIC LAMINECTOMY FOR EPIDURAL ABSCESS N/A 12/18/2019   Procedure: THORACIC LAMINECTOMY FOR EPIDURAL ABSCESS Thoracic Two, Thoracic Three;  Surgeon: [name], MD;  Location: [place];  Service: Neurosurgery;  Laterality: N/A;  THORACIC LAMINECTOMY FOR EPIDURAL ABSCESS Thoracic Two, Thoracic Three  . TONSILLECTOMY  AS CHILD     reports that he quit smoking about 10  years ago. His smoking use included cigarettes. He has a 12.00 pack-year smoking history. He has never used smokeless tobacco. He reports current alcohol use of about 3.0 standard drinks of alcohol per week. He reports that he does not use drugs.  Allergies  Allergen Reactions  . Ceclor [Cefaclor] Rash  . Sulfa Antibiotics Rash and Other (See Comments)    SEVERE RASH- childhood allergy    Family History  Problem Relation Age of Onset  . Emphysema Mother   . Cancer Father        bile duct     Prior to Admission medications   Medication Sig Start Date End Date Taking? Authorizing Provider  gabapentin (NEURONTIN) 300 MG capsule Take 1 capsule (300 mg total) by mouth 3 (three) times daily. 12/25/19  Yes Earlene Plater, MD  oxyCODONE 10 MG TABS Take 1 tablet (10 mg total) by mouth every 4 (four) hours while awake. Patient taking differently: Take 10-15 mg by mouth every 4 (four) hours while awake.  12/25/19  Yes Earlene Plater, MD  albuterol [facility] HFA) 108 (90 Base) MCG/ACT inhaler INHALE 2 PUFFS INTO THE LUNGS EVERY 6 HOURS AS NEEDED FOR WHEEZING ORSHORTNESS OF BREATH Patient taking differently: Inhale 2 puffs into the lungs every 6 (six) hours as needed for wheezing or shortness of breath.  09/22/19   Baird Lyons D, MD  amLODipine (NORVASC) 10 MG tablet Take 1 tablet (10 mg total) by mouth daily. 12/26/19   Earlene Plater, MD  ceFAZolin (ANCEF) 2-4 GM/100ML-% IVPB Inject 100 mLs (2 g total) into the vein every 8 (eight) hours. 12/25/19   Earlene Plater, MD  diclofenac (VOLTAREN) 50 MG EC tablet Take 50 mg by mouth daily.     [provider]  docusate (COLACE) 50 MG/5ML liquid Take 10 mLs (100 mg total) by mouth 2 (two) times daily. 12/25/19   Earlene Plater, MD  folic acid (FOLVITE) 1 MG tablet Take 1 tablet (1 mg total) by mouth daily. 12/26/19   Earlene Plater, MD  furosemide (LASIX) 40 MG tablet Take 40 mg by mouth daily as needed. 01/17/20   [provider]   lidocaine (LIDODERM) 5 % Place 1 patch onto the skin daily. Remove & Discard patch within 12 hours or as directed by MD 12/14/19   Caccavale, Sophia, PA-C  Melatonin 5 MG TABS Take 5 mg by mouth at bedtime as needed (sleep).     [provider]  Multiple Vitamin (MULTIVITAMIN WITH MINERALS) TABS tablet Take 1 tablet by mouth daily. 12/26/19   Earlene Plater, MD  oxyCODONE (OXY IR/ROXICODONE) 5 MG immediate release tablet Take 1 tablet (5 mg total) by mouth every 4 (four) hours as needed for moderate pain or severe pain. 12/25/19   Earlene Plater, MD  pantoprazole (PROTONIX) 40 MG tablet Take 1 tablet (40 mg total) by mouth daily at 12 noon. 12/25/19   Earlene Plater, MD  polyethylene glycol (MIRALAX / GLYCOLAX) 17 g packet Take 17 g by mouth 3 (three) times daily.  12/25/19   Earlene Plater, MD  potassium chloride SA (KLOR-CON) 20 MEQ tablet Take 20 mEq by mouth daily. 01/17/20   [provider]  senna (SENOKOT) 8.6 MG TABS tablet Take 1 tablet (8.6 mg total) by mouth daily. 12/26/19   Earlene Plater, MD  testosterone cypionate (DEPOTESTOSTERONE CYPIONATE) 200 MG/ML injection Inject 180 mg into the muscle every 14 (fourteen) days.  09/17/19   [provider]  thiamine 100 MG tablet Take 1 tablet (100 mg total) by mouth daily. 12/26/19   Earlene Plater, MD    Physical Exam: Vitals:   02/04/20 0330 02/04/20 0336 02/04/20 0339 02/04/20 0345  BP: 106/66 117/61  (!) 135/102  Pulse: 74 76  80  Resp: 13 18  14   Temp:   97.9 F (36.6 C)   TempSrc:   Oral   SpO2: 91% 96%  97%     Constitutional: NAD, calm  Eyes: PERTLA, lids and conjunctivae normal ENMT: Mucous membranes are moist. Posterior pharynx clear of any exudate or lesions.   Neck: normal, supple, no masses, no thyromegaly Respiratory: No rhonchi, no crackles. No accessory muscle use.  Cardiovascular: S1 & S2 heard, regular rate and rhythm. Pitting edema bilateral LE's. Abdomen: no tenderness, soft. Bowel  sounds active.  Musculoskeletal: no clubbing / cyanosis. No joint deformity upper and lower extremities.   Skin: no significant rashes, lesions, ulcers. Warm, dry, well-perfused. Neurologic: CN 2-12 grossly intact. Sensation intact. Moving all extremities.  Psychiatric: Alert, slow to respond to basic questions but ultimately able to state month, year, name of hospital, and describe recent events. Pleasant and cooperative.    Labs and Imaging on Admission: I have personally reviewed following labs and imaging studies  CBC: Recent Labs  Lab 02/04/20 0128  WBC 13.1*  NEUTROABS 9.4*  HGB 9.2*  HCT 28.9*  MCV 95.7  PLT 99991111   Basic Metabolic Panel: Recent Labs  Lab 02/04/20 0128  NA 135  K 4.2  CL 95*  CO2 28  GLUCOSE 142*  BUN 19  CREATININE 1.38*  CALCIUM 8.3*   GFR: Estimated Creatinine Clearance: 50.8 mL/min (A) (by C-G formula based on SCr of 1.38 mg/dL (H)). Liver Function Tests: Recent Labs  Lab 02/04/20 0128  AST 22  ALT 13  ALKPHOS 73  BILITOT 0.8  PROT 7.0  ALBUMIN 2.4*   No results for input(s): LIPASE, AMYLASE in the last 168 hours. No results for input(s): AMMONIA in the last 168 hours. Coagulation Profile: No results for input(s): INR, PROTIME in the last 168 hours. Cardiac Enzymes: No results for input(s): CKTOTAL, CKMB, CKMBINDEX, TROPONINI in the last 168 hours. BNP (last 3 results) No results for input(s): PROBNP in the last 8760 hours. HbA1C: No results for input(s): HGBA1C in the last 72 hours. CBG: No results for input(s): GLUCAP in the last 168 hours. Lipid Profile: No results for input(s): CHOL, HDL, LDLCALC, TRIG, CHOLHDL, LDLDIRECT in the last 72 hours. Thyroid Function Tests: No results for input(s): TSH, T4TOTAL, FREET4, T3FREE, THYROIDAB in the last 72 hours. Anemia Panel: No results for input(s): VITAMINB12, FOLATE, FERRITIN, TIBC, IRON, RETICCTPCT in the last 72 hours. Urine analysis:    Component Value Date/Time   COLORURINE  YELLOW 02/04/2020 0151   APPEARANCEUR CLOUDY (A) 02/04/2020 0151   LABSPEC 1.010 02/04/2020 0151   PHURINE 7.0 02/04/2020 0151   GLUCOSEU NEGATIVE 02/04/2020 0151   HGBUR NEGATIVE 02/04/2020 0151   BILIRUBINUR NEGATIVE 02/04/2020 0151   KETONESUR NEGATIVE 02/04/2020 0151   PROTEINUR 100 (  A) 02/04/2020 0151   NITRITE NEGATIVE 02/04/2020 0151   LEUKOCYTESUR LARGE (A) 02/04/2020 0151   Sepsis Labs: @LABRCNTIP (procalcitonin:4,lacticidven:4) ) Recent Results (from the past 240 hour(s))  Respiratory Panel by RT PCR (Flu A&B, Covid) - Urine, Clean Catch     Status: None   Collection Time: 02/04/20  1:36 AM   Specimen: Urine, Clean Catch  Result Value Ref Range Status   SARS Coronavirus 2 by RT PCR NEGATIVE NEGATIVE Final    Comment: (NOTE) SARS-CoV-2 target nucleic acids are NOT DETECTED. The SARS-CoV-2 RNA is generally detectable in upper respiratoy specimens during the acute phase of infection. The lowest concentration of SARS-CoV-2 viral copies this assay can detect is 131 copies/mL. A negative result does not preclude SARS-Cov-2 infection and should not be used as the sole basis for treatment or other patient management decisions. A negative result may occur with  improper specimen collection/handling, submission of specimen other than nasopharyngeal swab, presence of viral mutation(s) within the areas targeted by this assay, and inadequate number of viral copies (<131 copies/mL). A negative result must be combined with clinical observations, patient history, and epidemiological information. The expected result is Negative. Fact Sheet for Patients:  PinkCheek.be Fact Sheet for Healthcare Providers:  GravelBags.it This test is not yet ap proved or cleared by the [place] FDA and  has been authorized for detection and/or diagnosis of SARS-CoV-2 by FDA under an Emergency Use Authorization (EUA). This EUA will remain  in  effect (meaning this test can be used) for the duration of the COVID-19 declaration under Section 564(b)(1) of the Act, 21 U.S.C. section 360bbb-3(b)(1), unless the authorization is terminated or revoked sooner.    Influenza A by PCR NEGATIVE NEGATIVE Final   Influenza B by PCR NEGATIVE NEGATIVE Final    Comment: (NOTE) The Xpert Xpress SARS-CoV-2/FLU/RSV assay is intended as an aid in  the diagnosis of influenza from Nasopharyngeal swab specimens and  should not be used as a sole basis for treatment. Nasal washings and  aspirates are unacceptable for Xpert Xpress SARS-CoV-2/FLU/RSV  testing. Fact Sheet for Patients: PinkCheek.be Fact Sheet for Healthcare Providers: GravelBags.it This test is not yet approved or cleared by the [place] FDA and  has been authorized for detection and/or diagnosis of SARS-CoV-2 by  FDA under an Emergency Use Authorization (EUA). This EUA will remain  in effect (meaning this test can be used) for the duration of the  Covid-19 declaration under Section 564(b)(1) of the Act, 21  U.S.C. section 360bbb-3(b)(1), unless the authorization is  terminated or revoked. Performed at [facility], [place] [address], [place], [place] [address]      Radiological Exams on Admission: DG Chest Port 1 View  Result Date: 02/04/2020 CLINICAL DATA:  Fever EXAM: PORTABLE CHEST 1 VIEW COMPARISON:  December 21, 2019 FINDINGS: The heart size and mediastinal contours are within normal limits. Both lungs are clear. The visualized skeletal structures are unremarkable. IMPRESSION: No active disease. Electronically Signed   By: Constance Holster M.D.   On: 02/04/2020 00:39    EKG: Independently reviewed. Sinus rhythm, rate 82.   Assessment/Plan   1. Sepsis; recent MSSA bacteremia and epidural abscess  - Presents with fevers and general malaise, found to have fever, leukocytosis, and was slightly tachycardic  initially  - BP has been normal and lactate only 0.7  - No acute findings on CXR, COVID pcr negative, no bacteria seen on UA, and no urinary sxs, meningismus, or apparent cellulitis  - He  was recently treated for MSSA bacteremia with evacuation of epidural abscess and cefazolin that was stopped on 01/28/20  - Blood and urine cultures collected in ED, ID was consulted by ED physician, and MRI thoracic-spine has been ordered  - Continue to monitor off antibiotics as patient is stable, consult IR for drainage/culture if recurrent fluid collection noted on MRI, follow cultures, inflammatory markers, and clinical course    2. COPD  - Slight wheeze on admission, patient denies any recent increase in cough or SOB  - Continue as-needed albuterol   3. Right apical lung mass  - Noted incidentally on recent imaging, patient is former smoker, and PET had been scheduled for 2/24    4. Leg swelling  - Patient reports progressive bilateral leg swelling and was recently started on Lasix  - Echo from last month with LV EF 50-55%, normal RV function, and normal diastolic parameters  - Albumin is 2.4 and 100 protein on UA in ED  - Patient has urinary incontinence and 24-hr urine protein would require catheter  - Elevate legs, limit sodium intake, consult dietary    5. AKI   - SCr is 1.38 on admission, up from 1 earlier this month and 0.7 last month  - Possibly secondary to recent antibiotic or recently starting Lasix  - Hold diuretic, check FEUrea, renally-dose medications, monitor    6. Normocytic anemia  - Hgb is 9.2, down from 10.9 earlier this month and normal in January 2021  - Patient denies melena or hematochezia  - Check FOBT and anemia panel, repeat CBC tomorrow     DVT prophylaxis: SCD's  Code Status: DNR, confirmed with patient  Family Communication: Discussed with patient   Disposition Plan: Will likely return home once sepsis resolved  Consults called: ID consulted by ED physician   Admission status: Inpatient     Vianne Bulls, MD [facility] Pager: See [email or web address]  If 7AM-7PM, please contact the daytime attending [email or web address]  02/04/2020, 5:01 AM

## 2020-02-04 NOTE — Progress Notes (Signed)
Bilateral lower extremity venous duplex has been completed. Preliminary results can be found in CV Proc through chart review.   02/04/20 4:28 PM Carlos Levering RVT

## 2020-02-04 NOTE — ED Notes (Signed)
Patient transported to MRI 

## 2020-02-04 NOTE — Plan of Care (Addendum)
Pt has reported being NPO for over 24 hours and is expressing frustration with staying NPO and not having anyone tell him whether or not he will be having surgery today. RM paged Cyndia Skeeters, MD at [phone] and direct messaged MD at [phone].Cyndia Skeeters, MD responded at [phone] stating "neurosurgery will be consulting this afternoon."  Attempted calling neurosurgery at [phone] and was placed on hold.   Problem: Education: Goal: Knowledge of General Education information will improve Description: Including pain rating scale, medication(s)/side effects and non-pharmacologic comfort measures Outcome: Progressing   Problem: Health Behavior/Discharge Planning: Goal: Ability to manage health-related needs will improve Outcome: Progressing   Problem: Clinical Measurements: Goal: Will remain free from infection Outcome: Progressing Goal: Respiratory complications will improve Outcome: Progressing Goal: Cardiovascular complication will be avoided Outcome: Progressing   Problem: Elimination: Goal: Will not experience complications related to urinary retention Outcome: Progressing   Problem: Pain Managment: Goal: General experience of comfort will improve Outcome: Progressing   Problem: Safety: Goal: Ability to remain free from injury will improve Outcome: Progressing   Problem: Skin Integrity: Goal: Risk for impaired skin integrity will decrease Outcome: Progressing

## 2020-02-04 NOTE — Progress Notes (Signed)
Initial Nutrition Assessment  DOCUMENTATION CODES:   Not applicable  INTERVENTION:   -Ensure Enlive po BID, each supplement provides 350 kcal and 20 grams of protein -MVI with minerals daily  NUTRITION DIAGNOSIS:   Increased nutrient needs related to post-op healing as evidenced by estimated needs.  GOAL:   Patient will meet greater than or equal to 90% of their needs  MONITOR:   PO intake, Supplement acceptance, Diet advancement, Labs, Weight trends, Skin, I & O's  REASON FOR ASSESSMENT:   Consult Assessment of nutrition requirement/status  ASSESSMENT:   Kent Flowers is a 74 y.o. male with medical history significant for COPD, right apical lung mass scheduled for PET scan in 1 week, recent leg swelling started on Lasix, and recent hospital admission with thoracic epidural abscess and MSSA bacteremia for which he completed a course of cefazolin on 01/28/2020, now presenting to the ED for evaluation of fevers and general malaise.  Patient reports that he had been doing well until yesterday when he developed a fever.  He denies any recent cough, dysuria, abdominal pain, neck stiffness, diarrhea, or rash.  He has not noted any significant worsening in his chronic back pain.  Patient states that he preferred to stay home, but his wife called EMS and encouraged him to be evaluated in the ED.  Pt admitted with sepsis; recent MSSA bacteremia and epidural abscess.   Reviewed I/O's: +6 ml x 24 hours  Per IR notes, pt with epidural abscess; plan for neurosurgery consultation. Pt NPO for upcoming procedure.   Pt receiving nursing care at time of visit. Pt complaining of SOB and requesting breathing treatment.   Reviewed wt hx; noted wt has been stable over the past 2-3 years. Per RN assessment, pt with deep pitting edema, which may be masking true weight loss and fat and muscle depletion.  Labs reviewed.   Diet Order:   Diet Order            Diet regular Room service  appropriate? Yes; Fluid consistency: Thin  Diet effective now              EDUCATION NEEDS:   No education needs have been identified at this time  Skin:  Skin Assessment: Skin Integrity Issues: Skin Integrity Issues:: Other (Comment) Other: serous blister lt ankle  Last BM:  02/02/20  Height:   Ht Readings from Last 1 Encounters:  01/21/20 5\' 8"  (1.727 m)    Weight:   Wt Readings from Last 1 Encounters:  02/04/20 83 kg    Ideal Body Weight:  70 kg  BMI:  Body mass index is 27.82 kg/m.  Estimated Nutritional Needs:   Kcal:  2100-2300  Protein:  120-135 grams  Fluid:  > 2.1 L    Loistine Chance, RD, LDN, [place] Registered Dietitian II Certified Diabetes Care and Education Specialist Please refer to [facility] for RD and/or RD on-call/weekend/after hours pager

## 2020-02-04 NOTE — Plan of Care (Signed)

## 2020-02-04 NOTE — ED Provider Notes (Signed)
[facility] EMERGENCY DEPARTMENT Provider Note   CSN: [id number] Arrival date & time: 02/03/20  2341     History Chief Complaint  Patient presents with  . Fever    Kent Flowers is a 74 y.o. male.  The history is provided by the patient and medical records.  Fever   74 year old male with history of BPH, chronic back pain, COPD, hematuria, Peyronie's disease, recent admission in December 2020 with MSSA bacteremia secondary to thoracic epidural abscess, presenting to the ED with fever.  Patient is s/p T2 and T3 Laminectomy with Dr. Zada Finders, had PICC line in right upper arm and just finished 6 weeks of IV cefazolin as recommended by infectious disease on 01/28/20.  PICC has since been removed.  States he has been doing fairly well, does continue to have some back pain which is expected following surgery but is controlled by his oxycodone.  States today he started feeling a bit tired and started running fever of 101.57F.  He has had a slight cough but non-productive.  Has been eating/drinking well, no vomiting or diarrhea.  No new falls or trauma.  No sick contacts or known COVID exposures.  He did receive 1000 mg Tylenol prior to arrival.  Past Medical History:  Diagnosis Date  . Benign localized prostatic hyperplasia with lower urinary tract symptoms (LUTS)   . Chronic low back pain   . COPD (chronic obstructive pulmonary disease) with emphysema (HCC)    PULMOLOGIST-- DR Tarri Fuller YOUNG  . 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 ED (erectile dysfunction)   . Full dentures   . Gross hematuria   . 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 Hypogonadism male   . Knee pain, bilateral    INTERMITTENT--  MENISCUS  . OA (osteoarthritis)    KNEES  . Peyronie's disease   . Pulmonary nodules followed by dr c. young (pulmologist)   LLL and LUL-- per last CT 10-01-2013  stable  and previous right lung nodule not seen  . Thoracic ascending aortic aneurysm (HCC)    STABLE PER LAST CT 10-01-2013  4CM--  ECTATIC   . Urethral stricture   . Urgency of urination   . Wears glasses     Patient Active Problem List   Diagnosis Date Noted  . Pulmonary mass 01/22/2020  . PICC (peripherally inserted central catheter) in place 01/22/2020  . Paresthesia of right upper extremity 12/18/2019  . 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 Status post laminectomy 12/18/2019  . Insomnia 09/22/2019  . Neck pain 11/04/2017  . Other social stressor 07/19/2016  . COPD with acute bronchitis ([place]) 10/04/2014  . Hepatitis, unspecified 03/13/2014  . Chronic low back pain   . Knee pain, bilateral   . Right knee DJD   . Hepatitis 11/03/2013  . 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 Aortic calcification ([place]) 11/03/2013  . Coronary atherosclerosis of native coronary artery 11/03/2013  . Penile neoplasm   . Ascending aortic aneurysm ([place])   . Lung nodule 05/23/2012  . COPD mixed type ([place]) 02/24/2012    Past Surgical History:  Procedure Laterality Date  . CYSTOSCOPY WITH BIOPSY N/A 04/10/2017   Procedure: POSSIBLE BLADDER  BIOPSY;  Surgeon: Festus Aloe, MD;  Location: [facility];  Service: Urology;  Laterality: N/A;  . CYSTOSCOPY WITH URETHRAL DILATATION N/A 04/10/2017  Procedure: CYSTOSCOPY WITH BALLOON URETHRALSTRICTURE DILATATION;  Surgeon: Festus Aloe, MD;  Location: [facility];  Service: Urology;  Laterality: N/A;  . PENILE BIOPSY N/A 03/14/2013   Procedure: PENILE BIOPSY;  Surgeon: Fredricka Bonine, MD;  Location: [facility];  Service: Urology;  Laterality: N/A;  . REATTACHMENT LEFT INDEX FINGER  1988  . SHOULDER ARTHROSCOPY WITH ROTATOR CUFF REPAIR AND SUBACROMIAL DECOMPRESSION Left 2004  . THORACIC LAMINECTOMY FOR EPIDURAL ABSCESS N/A 12/18/2019   Procedure: THORACIC LAMINECTOMY FOR EPIDURAL  ABSCESS Thoracic Two, Thoracic Three;  Surgeon: [name], MD;  Location: [place];  Service: Neurosurgery;  Laterality: N/A;  THORACIC LAMINECTOMY FOR EPIDURAL ABSCESS Thoracic Two, Thoracic Three  . TONSILLECTOMY  AS CHILD       Family History  Problem Relation Age of Onset  . Emphysema Mother   . Cancer Father        bile duct    Social History   Tobacco Use  . Smoking status: Former Smoker    Packs/day: 1.00    Years: 12.00    Pack years: 12.00    Types: Cigarettes    Quit date: 12/18/2009    Years since quitting: 10.1  . Smokeless tobacco: Never Used  Substance Use Topics  . Alcohol use: Yes    Alcohol/week: 3.0 standard drinks    Types: 3 Glasses of wine per week  . Drug use: No    Home Medications Prior to Admission medications   Medication Sig Start Date End Date Taking? Authorizing Provider  albuterol (PROAIR HFA) 108 (90 Base) MCG/ACT inhaler INHALE 2 PUFFS INTO THE LUNGS EVERY 6 HOURS AS NEEDED FOR WHEEZING ORSHORTNESS OF BREATH Patient taking differently: Inhale 2 puffs into the lungs every 6 (six) hours as needed for wheezing or shortness of breath.  09/22/19   Baird Lyons D, MD  amLODipine (NORVASC) 10 MG tablet Take 1 tablet (10 mg total) by mouth daily. 12/26/19   Earlene Plater, MD  ceFAZolin (ANCEF) 2-4 GM/100ML-% IVPB Inject 100 mLs (2 g total) into the vein every 8 (eight) hours. 12/25/19   Earlene Plater, MD  diclofenac (VOLTAREN) 50 MG EC tablet Take 50 mg by mouth daily.     [provider]  docusate (COLACE) 50 MG/5ML liquid Take 10 mLs (100 mg total) by mouth 2 (two) times daily. 12/25/19   Earlene Plater, MD  folic acid (FOLVITE) 1 MG tablet Take 1 tablet (1 mg total) by mouth daily. 12/26/19   Earlene Plater, MD  furosemide (LASIX) 40 MG tablet Take 40 mg by mouth daily as needed. 01/17/20   [provider]  gabapentin (NEURONTIN) 300 MG capsule Take 1 capsule (300 mg total) by mouth 3 (three) times daily. 12/25/19    Earlene Plater, MD  lidocaine (LIDODERM) 5 % Place 1 patch onto the skin daily. Remove & Discard patch within 12 hours or as directed by MD 12/14/19   Caccavale, Sophia, PA-C  Melatonin 5 MG TABS Take 5 mg by mouth at bedtime as needed (sleep).     [provider]  Multiple Vitamin (MULTIVITAMIN WITH MINERALS) TABS tablet Take 1 tablet by mouth daily. 12/26/19   Earlene Plater, MD  oxyCODONE (OXY IR/ROXICODONE) 5 MG immediate release tablet Take 1 tablet (5 mg total) by mouth every 4 (four) hours as needed for moderate pain or severe pain. 12/25/19   Earlene Plater, MD  oxyCODONE 10 MG TABS Take 1 tablet (10 mg total) by mouth every 4 (four) hours  while awake. 12/25/19   Earlene Plater, MD  pantoprazole (PROTONIX) 40 MG tablet Take 1 tablet (40 mg total) by mouth daily at 12 noon. 12/25/19   Earlene Plater, MD  polyethylene glycol (MIRALAX / GLYCOLAX) 17 g packet Take 17 g by mouth 3 (three) times daily. 12/25/19   Earlene Plater, MD  potassium chloride SA (KLOR-CON) 20 MEQ tablet Take 20 mEq by mouth daily. 01/17/20   [provider]  senna (SENOKOT) 8.6 MG TABS tablet Take 1 tablet (8.6 mg total) by mouth daily. 12/26/19   Earlene Plater, MD  testosterone cypionate (DEPOTESTOSTERONE CYPIONATE) 200 MG/ML injection Inject 180 mg into the muscle every 14 (fourteen) days.  09/17/19   [provider]  thiamine 100 MG tablet Take 1 tablet (100 mg total) by mouth daily. 12/26/19   Earlene Plater, MD    Allergies    Ceclor [cefaclor] and Sulfa antibiotics  Review of Systems   Review of Systems  Constitutional: Positive for fever.  All other systems reviewed and are negative.   Physical Exam Updated Vital Signs BP 124/61   Pulse (!) 104   Temp (!) 101.5 F (38.6 C) (Rectal)   Resp 14   SpO2 94%   Physical Exam Vitals and nursing note reviewed.  Constitutional:      Appearance: He is well-developed.     Comments: Elderly, no acute distress  HENT:      Head: Normocephalic and atraumatic.  Eyes:     Conjunctiva/sclera: Conjunctivae normal.     Pupils: Pupils are equal, round, and reactive to light.  Cardiovascular:     Rate and Rhythm: Normal rate and regular rhythm.     Heart sounds: Normal heart sounds.  Pulmonary:     Effort: Pulmonary effort is normal.     Breath sounds: Normal breath sounds.  Abdominal:     General: Bowel sounds are normal.     Palpations: Abdomen is soft.  Musculoskeletal:        General: Normal range of motion.     Cervical back: Normal range of motion.     Comments: Midline thoracic incision appears well-healed without any overlying signs of infection, there is no tissue crepitus or fluctuance noted PICC line from right upper extremity has been removed, dressing in place which is clean, dry, intact  Skin:    General: Skin is warm and dry.  Neurological:     Mental Status: He is alert and oriented to person, place, and time.     ED Results / Procedures / Treatments   Labs (all labs ordered are listed, but only abnormal results are displayed) Labs Reviewed  CBC WITH DIFFERENTIAL/PLATELET - Abnormal; Notable for the following components:      Result Value   WBC 13.1 (*)    RBC 3.02 (*)    Hemoglobin 9.2 (*)    HCT 28.9 (*)    Neutro Abs 9.4 (*)    Monocytes Absolute 1.5 (*)    All other components within normal limits  COMPREHENSIVE METABOLIC PANEL - Abnormal; Notable for the following components:   Chloride 95 (*)    Glucose, Bld 142 (*)    Creatinine, Ser 1.38 (*)    Calcium 8.3 (*)    Albumin 2.4 (*)    GFR calc non Af Amer 51 (*)    GFR calc Af Amer 59 (*)    All other components within normal limits  URINALYSIS, ROUTINE W REFLEX MICROSCOPIC - Abnormal; Notable for the following components:  APPearance CLOUDY (*)    Protein, ur 100 (*)    Leukocytes,Ua LARGE (*)    WBC, UA >50 (*)    All other components within normal limits  C-REACTIVE PROTEIN - Abnormal; Notable for the following  components:   CRP 19.2 (*)    All other components within normal limits  SEDIMENTATION RATE - Abnormal; Notable for the following components:   Sed Rate 126 (*)    All other components within normal limits  RESPIRATORY PANEL BY RT PCR (FLU A&B, COVID)  CULTURE, BLOOD (ROUTINE X 2)  CULTURE, BLOOD (ROUTINE X 2)  URINE CULTURE  LACTIC ACID, PLASMA    EKG EKG Interpretation  Date/Time:  Wednesday February 04 2020 01:17:45 EST Ventricular Rate:  82 PR Interval:    QRS Duration: 89 QT Interval:  357 QTC Calculation: 417 R Axis:   -29 Text Interpretation: Sinus rhythm Borderline left axis deviation Borderline low voltage, extremity leads Rate improved compared to prior Confirmed by Pryor Curia [phone]) on 02/04/2020 2:20:24 AM   Radiology DG Chest Port 1 View  Result Date: 02/04/2020 CLINICAL DATA:  Fever EXAM: PORTABLE CHEST 1 VIEW COMPARISON:  December 21, 2019 FINDINGS: The heart size and mediastinal contours are within normal limits. Both lungs are clear. The visualized skeletal structures are unremarkable. IMPRESSION: No active disease. Electronically Signed   By: Constance Holster M.D.   On: 02/04/2020 00:39    Procedures Procedures (including critical care time)  Medications Ordered in ED Medications  oxyCODONE (Oxy IR/ROXICODONE) immediate release tablet 10 mg (10 mg Oral Given 02/04/20 0117)  gabapentin (NEURONTIN) capsule 300 mg (300 mg Oral Given 02/04/20 0209)    ED Course  I have reviewed the triage vital signs and the nursing notes.  Pertinent labs & imaging results that were available during my care of the patient were reviewed by me and considered in my medical decision making (see chart for details).    MDM Rules/Calculators/A&P  74 year old male with history of MSSA bacteremia secondary to thoracic epidural abscess status post laminectomy and 6 weeks of IV Ancef, presenting to the ED with fever.  Has been feeling well up until today.  Finished his antibiotics  1 week ago.  He denies any real symptoms, has ongoing back pain but this has been well controlled with his oxycodone.  He is febrile here but overall nontoxic in appearance.  His thoracic incision appears clean without any drainage or superficial signs of infection.  I do not appreciate any fluctuance.  We will plan for infectious work-up including labs, urinalysis, chest x-ray, Covid screen, and inflammatory markers.  If no definitive source identified, likely will need repeat MRI of the thoracic spine.  Work-up as above, leukocytosis with normal lactate.  Inflammatory markers increasing (CRP 19.2, ESR 126 compared with 8.6 and 76 on 01/26/20).  Chest x-ray clear.  UA with 50 WBC, large leuks, no bacteria.  Culture pending and patient without urinary symptoms.  COVID screen negative.  Patient without concrete source at this time.  Likely will need repeat MRI thoracic spine at this point.  3:44 AM Discussed with infectious disease, Dr. Tommy Medal-- would recommend holding abx now given lack of urinary symptoms and follow urine culture.  Would prefer to get MRI and if repeat infection, will need aspirate prior to re-starting abx.  They will follow along during hospitalization.  Discussed with hospitalist, Dr. Myna Hidalgo-- will admit for ongoing care.  Final Clinical Impression(s) / ED Diagnoses Final diagnoses:  Fever, unspecified fever  cause    Rx / DC Orders ED Discharge Orders    None       Larene Pickett, PA-C 02/04/20 0451    Merryl Hacker, MD 02/08/20 [phone]

## 2020-02-04 NOTE — Progress Notes (Signed)
IR requested by Dr. Cyndia Skeeters for management of epidural abscess (aspiration/drainage).  Case reviewed by Dr. Vernard Gambles who states that epidural abscesses are not managed in IR (IR does not aspirate/drain epidural abscesses). Recommend neurosurgery consult for further management. Will delete order. Dr. Cyndia Skeeters made aware.   IR available in future if needed.   Bea Graff Dwanda Tufano, PA-C 02/04/2020, 2:13 PM
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
## 2020-02-05 DIAGNOSIS — M4644 Discitis, unspecified, thoracic region: Secondary | ICD-10-CM

## 2020-02-05 DIAGNOSIS — G062 Extradural and subdural abscess, unspecified: Secondary | ICD-10-CM

## 2020-02-05 DIAGNOSIS — R509 Fever, unspecified: Secondary | ICD-10-CM

## 2020-02-05 DIAGNOSIS — D509 Iron deficiency anemia, unspecified: Secondary | ICD-10-CM

## 2020-02-05 DIAGNOSIS — D519 Vitamin B12 deficiency anemia, unspecified: Secondary | ICD-10-CM

## 2020-02-05 LAB — IRON AND TIBC
Iron: 13 ug/dL — ABNORMAL LOW (ref 45–182)
Saturation Ratios: 8 % — ABNORMAL LOW (ref 17.9–39.5)
TIBC: 167 ug/dL — ABNORMAL LOW (ref 250–450)
UIBC: 154 ug/dL

## 2020-02-05 LAB — URINE CULTURE

## 2020-02-05 LAB — CBC WITH DIFFERENTIAL/PLATELET
Abs Immature Granulocytes: 0.07 10*3/uL (ref 0.00–0.07)
Basophils Absolute: 0 10*3/uL (ref 0.0–0.1)
Basophils Relative: 0 %
Eosinophils Absolute: 0.1 10*3/uL (ref 0.0–0.5)
Eosinophils Relative: 1 %
HCT: 31.1 % — ABNORMAL LOW (ref 39.0–52.0)
Hemoglobin: 9.9 g/dL — ABNORMAL LOW (ref 13.0–17.0)
Immature Granulocytes: 1 %
Lymphocytes Relative: 20 %
Lymphs Abs: 2.7 10*3/uL (ref 0.7–4.0)
MCH: 29.9 pg (ref 26.0–34.0)
MCHC: 31.8 g/dL (ref 30.0–36.0)
MCV: 94 fL (ref 80.0–100.0)
Monocytes Absolute: 1.2 10*3/uL — ABNORMAL HIGH (ref 0.1–1.0)
Monocytes Relative: 9 %
Neutro Abs: 9.2 10*3/uL — ABNORMAL HIGH (ref 1.7–7.7)
Neutrophils Relative %: 69 %
Platelets: 412 10*3/uL — ABNORMAL HIGH (ref 150–400)
RBC: 3.31 MIL/uL — ABNORMAL LOW (ref 4.22–5.81)
RDW: 12.9 % (ref 11.5–15.5)
WBC: 13.3 10*3/uL — ABNORMAL HIGH (ref 4.0–10.5)
nRBC: 0 % (ref 0.0–0.2)

## 2020-02-05 LAB — COMPREHENSIVE METABOLIC PANEL
ALT: 13 U/L (ref 0–44)
AST: 22 U/L (ref 15–41)
Albumin: 2.3 g/dL — ABNORMAL LOW (ref 3.5–5.0)
Alkaline Phosphatase: 70 U/L (ref 38–126)
Anion gap: 12 (ref 5–15)
BUN: 20 mg/dL (ref 8–23)
CO2: 28 mmol/L (ref 22–32)
Calcium: 8.5 mg/dL — ABNORMAL LOW (ref 8.9–10.3)
Chloride: 97 mmol/L — ABNORMAL LOW (ref 98–111)
Creatinine, Ser: 1.29 mg/dL — ABNORMAL HIGH (ref 0.61–1.24)
GFR calc Af Amer: >60 mL/min (ref >60)
GFR calc non Af Amer: 55 mL/min — ABNORMAL LOW (ref >60)
Glucose, Bld: 113 mg/dL — ABNORMAL HIGH (ref 70–99)
Potassium: 4.2 mmol/L (ref 3.5–5.1)
Sodium: 137 mmol/L (ref 135–145)
Total Bilirubin: 0.8 mg/dL (ref 0.3–1.2)
Total Protein: 6.7 g/dL (ref 6.5–8.1)

## 2020-02-05 LAB — RETICULOCYTES
Immature Retic Fract: 8.6 % (ref 2.3–15.9)
RBC.: 3.27 MIL/uL — ABNORMAL LOW (ref 4.22–5.81)
Retic Count, Absolute: 55.9 10*3/uL (ref 19.0–186.0)
Retic Ct Pct: 1.7 % (ref 0.4–3.1)

## 2020-02-05 LAB — BODY FLUID CELL COUNT WITH DIFFERENTIAL
Lymphs, Fluid: 8 %
Neutrophil Count, Fluid: 92 % — ABNORMAL HIGH (ref 0–25)
Total Nucleated Cell Count, Fluid: 31472 cu mm — ABNORMAL HIGH (ref 0–1000)

## 2020-02-05 LAB — VITAMIN B12: Vitamin B-12: 131 pg/mL — ABNORMAL LOW (ref 180–914)

## 2020-02-05 LAB — FOLATE: Folate: 34.8 ng/mL (ref >5.9)

## 2020-02-05 LAB — FERRITIN: Ferritin: 925 ng/mL — ABNORMAL HIGH (ref 24–336)

## 2020-02-05 MED ORDER — HYDROMORPHONE HCL 1 MG/ML IJ SOLN
INTRAMUSCULAR | Status: AC
  Filled 2020-02-05: qty 1

## 2020-02-05 MED ORDER — LIDOCAINE HCL 1 % IJ SOLN
5.0000 mL | Freq: Once | INTRAMUSCULAR | Status: DC
  Filled 2020-02-05: qty 5

## 2020-02-05 MED ORDER — LIDOCAINE HCL (PF) 1 % IJ SOLN
INTRAMUSCULAR | Status: AC
  Filled 2020-02-05: qty 5

## 2020-02-05 MED ORDER — OXYCODONE HCL 5 MG PO TABS
10.0000 mg | ORAL_TABLET | ORAL | Status: DC
  Administered 2020-02-05 – 2020-02-07 (×11): 10 mg via ORAL
  Filled 2020-02-05 (×11): qty 2

## 2020-02-05 MED ORDER — CYANOCOBALAMIN 1000 MCG/ML IJ SOLN
1000.0000 ug | Freq: Once | INTRAMUSCULAR | Status: AC
  Administered 2020-02-05: 12:00:00 1000 ug via INTRAMUSCULAR
  Filled 2020-02-05: qty 1

## 2020-02-05 MED ORDER — HYDROMORPHONE HCL 1 MG/ML IJ SOLN
1.0000 mg | Freq: Once | INTRAMUSCULAR | Status: AC
  Administered 2020-02-05: 09:00:00 1 mg via INTRAVENOUS

## 2020-02-05 MED ORDER — CEFAZOLIN SODIUM-DEXTROSE 2-4 GM/100ML-% IV SOLN
2.0000 g | Freq: Three times a day (TID) | INTRAVENOUS | Status: DC
  Administered 2020-02-05 – 2020-02-06 (×4): 2 g via INTRAVENOUS
  Filled 2020-02-05 (×4): qty 100

## 2020-02-05 MED ORDER — VITAMIN B-12 1000 MCG PO TABS
1000.0000 ug | ORAL_TABLET | Freq: Every day | ORAL | Status: DC
  Administered 2020-02-05 – 2020-02-07 (×3): 1000 ug via ORAL
  Filled 2020-02-05 (×3): qty 1

## 2020-02-05 NOTE — Progress Notes (Signed)
Subjective: No new complaints   Antibiotics:  Anti-infectives (From admission, onward)   Start     Dose/Rate Route Frequency Ordered Stop   02/05/20 1400  ceFAZolin (ANCEF) IVPB 2g/100 mL premix     2 g 200 mL/hr over 30 Minutes Intravenous Every 8 hours 02/05/20 0918        Medications: Scheduled Meds: . albuterol  2 puff Inhalation BID  . Chlorhexidine Gluconate Cloth  6 each Topical Daily  . cyanocobalamin  1,000 mcg Intramuscular Once  . feeding supplement (ENSURE ENLIVE)  237 mL Oral BID BM  . folic acid  1 mg Oral Daily  . gabapentin  300 mg Oral TID  . heparin injection (subcutaneous)  5,000 Units Subcutaneous Q8H  . HYDROmorphone      . lidocaine (PF)      . lidocaine  5 mL Intradermal Once  . multivitamin with minerals  1 tablet Oral Daily  . oxyCODONE  10 mg Oral Q4H while awake  . pantoprazole  40 mg Oral Daily  . sodium chloride flush  3 mL Intravenous Q12H  . sodium chloride flush  3 mL Intravenous Q12H  . vitamin B-12  1,000 mcg Oral Daily   Continuous Infusions: . sodium chloride    .  ceFAZolin (ANCEF) IV     PRN Meds:.sodium chloride, acetaminophen **OR** acetaminophen, albuterol, ondansetron **OR** ondansetron (ZOFRAN) IV, senna-docusate, sodium chloride flush    Objective: Weight change:   Intake/Output Summary (Last 24 hours) at 02/05/2020 1138 Last data filed at 02/05/2020 [id number] Gross per 24 hour  Intake --  Output 900 ml  Net -900 ml   Blood pressure (!) 144/82, pulse 85, temperature 97.9 F (36.6 C), temperature source Oral, resp. rate 17, weight 83 kg, SpO2 96 %. Temp:  [97.9 F (36.6 C)-101.4 F (38.6 C)] 97.9 F (36.6 C) (02/18 0800) Pulse Rate:  [85-96] 85 (02/18 0800) Resp:  [17-18] 17 (02/18 0800) BP: (127-147)/(75-87) 144/82 (02/18 0800) SpO2:  [93 %-97 %] 96 % (02/18 0800)  Physical Exam: General: Alert and awake, oriented x3, not in any acute distress sitting in chair eating breakfast. HEENT: anicteric sclera,  EOMI CVS regular rate, normal  Chest: , no wheezing, no respiratory distress Abdomen: soft non-distended,  Skin: no rashes Neuro: nonfocal  CBC:    BMET Recent Labs    02/04/20 0128 02/05/20 0214  NA 135 137  K 4.2 4.2  CL 95* 97*  CO2 28 28  GLUCOSE 142* 113*  BUN 19 20  CREATININE 1.38* 1.29*  CALCIUM 8.3* 8.5*     Liver Panel  Recent Labs    02/04/20 0128 02/05/20 0214  PROT 7.0 6.7  ALBUMIN 2.4* 2.3*  AST 22 22  ALT 13 13  ALKPHOS 73 70  BILITOT 0.8 0.8       Sedimentation Rate Recent Labs    02/04/20 0128  ESRSEDRATE 126*   C-Reactive Protein Recent Labs    02/04/20 0128  CRP 19.2*    Micro Results: Recent Results (from the past 720 hour(s))  Urine culture     Status: Abnormal   Collection Time: 02/04/20 12:13 AM   Specimen: Urine, Random  Result Value Ref Range Status   Specimen Description URINE, RANDOM  Final   Special Requests   Final    NONE Performed at [facility], [address] [place], [place] [address]    Culture MULTIPLE SPECIES PRESENT, SUGGEST RECOLLECTION (A)  Final  Report Status 02/05/2020 FINAL  Final  Blood culture (routine x 2)     Status: None (Preliminary result)   Collection Time: 02/04/20  1:28 AM   Specimen: BLOOD  Result Value Ref Range Status   Specimen Description BLOOD RIGHT ARM  Final   Special Requests   Final    BOTTLES DRAWN AEROBIC AND ANAEROBIC Blood Culture results may not be optimal due to an inadequate volume of blood received in culture bottles   Culture   Final    NO GROWTH 1 DAY Performed at [facility], [place] [address], [place], [place] [address]    Report Status PENDING  Incomplete  Blood culture (routine x 2)     Status: None (Preliminary result)   Collection Time: 02/04/20  1:28 AM   Specimen: BLOOD  Result Value Ref Range Status   Specimen Description BLOOD RIGHT HAND  Final   Special Requests   Final    BOTTLES DRAWN AEROBIC AND ANAEROBIC Blood Culture adequate  volume   Culture   Final    NO GROWTH 1 DAY Performed at [facility], [place] [address] [place], [place] [address]    Report Status PENDING  Incomplete  Respiratory Panel by RT PCR (Flu A&B, Covid) - Urine, Clean Catch     Status: None   Collection Time: 02/04/20  1:36 AM   Specimen: Urine, Clean Catch  Result Value Ref Range Status   SARS Coronavirus 2 by RT PCR NEGATIVE NEGATIVE Final    Comment: (NOTE) SARS-CoV-2 target nucleic acids are NOT DETECTED. The SARS-CoV-2 RNA is generally detectable in upper respiratoy specimens during the acute phase of infection. The lowest concentration of SARS-CoV-2 viral copies this assay can detect is 131 copies/mL. A negative result does not preclude SARS-Cov-2 infection and should not be used as the sole basis for treatment or other patient management decisions. A negative result may occur with  improper specimen collection/handling, submission of specimen other than nasopharyngeal swab, presence of viral mutation(s) within the areas targeted by this assay, and inadequate number of viral copies (<131 copies/mL). A negative result must be combined with clinical observations, patient history, and epidemiological information. The expected result is Negative. Fact Sheet for Patients:  PinkCheek.be Fact Sheet for Healthcare Providers:  GravelBags.it This test is not yet ap proved or cleared by the [place] FDA and  has been authorized for detection and/or diagnosis of SARS-CoV-2 by FDA under an Emergency Use Authorization (EUA). This EUA will remain  in effect (meaning this test can be used) for the duration of the COVID-19 declaration under Section 564(b)(1) of the Act, 21 U.S.C. section 360bbb-3(b)(1), unless the authorization is terminated or revoked sooner.    Influenza A by PCR NEGATIVE NEGATIVE Final   Influenza B by PCR NEGATIVE NEGATIVE Final    Comment: (NOTE) The  Xpert Xpress SARS-CoV-2/FLU/RSV assay is intended as an aid in  the diagnosis of influenza from Nasopharyngeal swab specimens and  should not be used as a sole basis for treatment. Nasal washings and  aspirates are unacceptable for Xpert Xpress SARS-CoV-2/FLU/RSV  testing. Fact Sheet for Patients: PinkCheek.be Fact Sheet for Healthcare Providers: GravelBags.it This test is not yet approved or cleared by the [place] FDA and  has been authorized for detection and/or diagnosis of SARS-CoV-2 by  FDA under an Emergency Use Authorization (EUA). This EUA will remain  in effect (meaning this test can be used) for the duration of the  Covid-19 declaration under  Section 564(b)(1) of the Act, 21  U.S.C. section 360bbb-3(b)(1), unless the authorization is  terminated or revoked. Performed at [facility], [place] [address] [place], [place] [address]     Studies/Results: MR CERVICAL SPINE W WO CONTRAST  Result Date: 02/04/2020 CLINICAL DATA:  MSSA bacteremia. Thoracic epidural abscess status post laminectomy and IV antibiotics. Fever. Elevated serum inflammatory markers EXAM: MRI CERVICAL AND THORACIC SPINE WITHOUT AND WITH CONTRAST TECHNIQUE: Multiplanar and multiecho pulse sequences of the cervical spine, to include the craniocervical junction and cervicothoracic junction, and thoracic spine, were obtained without and with intravenous contrast. CONTRAST:  33mL GADAVIST GADOBUTROL 1 MMOL/ML IV SOLN COMPARISON:  MRI 12/18/2019 FINDINGS: Technical note: Motion degraded examination. MRI CERVICAL SPINE FINDINGS Alignment: Straightening of the cervical lordosis. Vertebrae: Fluid signal within the C7-T1 disc space and interval collapse of disc height with loss of definition of the vertebral body endplates compatible with discitis. There is low T1 signal changes with high T2 signal and enhancement within the C7, T1, T2, and T3 vertebral bodies  compatible with osteomyelitis. Cord: Enhancing material within the epidural space of the lower cervical spine extending cranially to approximately the C4 level. Posterior Fossa, vertebral arteries, paraspinal tissues: Extensive soft tissue edema and enhancement of the posterior paraspinal soft tissues. There is prevertebral edema and enhancement anterior to the cervicothoracic junction. Right apical pleuroparenchymal thickening/scarring. Disc levels: Multilevel degenerative changes of the cervical spine including multiple levels of mild-to-moderate canal stenosis. Canal stenosis appears moderate at C3-4 and C4-5. MRI THORACIC SPINE FINDINGS Alignment:  Physiologic. Vertebrae:  Osteomyelitis of the T1, T2, and T3 vertebral bodies. Cord: Enhancing collection in the posterior epidural space extends to the T5 level (series 34, image 10). Circumferential enhancing epidural collection at the T2 level extending cranially into the cervical spine to approximately the C4 level (series 34, image 9). Enhancing material involves the bilateral neural foramina at T1 through T4. There is prevertebral phlegmonous changes anteriorly centered at the cervicothoracic junction. Paraspinal and other soft tissues: Large rim enhancing collection in the posterior paraspinal soft tissues at laminectomy site in the upper thoracic spine extending to the posterior epidural space with enhancing phlegmonous soft tissue extending into the epidural space of the cervical and thoracic spines. Posterior paraspinal collection measures up to 4.1 x 3.3 cm trans axially by 8.8 cm cranial caudally. There is marked surrounding soft tissue edema and enhancement suggesting cellulitis. Disc levels: No high-grade foraminal or canal stenosis of the thoracic spine. IMPRESSION: 1. C7-T1 discitis-osteomyelitis. There is acute osteomyelitis also involving the T2 and T3 vertebral bodies. 2. Enhancing epidural phlegmon/abscess extending from approximately C4 through  T5. 3. Rim-enhancing fluid collection at the laminectomy bed abutting the posterior epidural space. Collection measures approximately 9 x 3 x 4 cm. 4. Anterior prevertebral phlegmonous changes centered at cervicothoracic junction. These results will be called to the ordering clinician or representative by the Radiologist Assistant, and communication documented in the PACS or zVision Dashboard. Electronically Signed   By: Davina Poke D.O.   On: 02/04/2020 09:42   MR THORACIC SPINE W WO CONTRAST  Result Date: 02/04/2020 CLINICAL DATA:  MSSA bacteremia. Thoracic epidural abscess status post laminectomy and IV antibiotics. Fever. Elevated serum inflammatory markers EXAM: MRI CERVICAL AND THORACIC SPINE WITHOUT AND WITH CONTRAST TECHNIQUE: Multiplanar and multiecho pulse sequences of the cervical spine, to include the craniocervical junction and cervicothoracic junction, and thoracic spine, were obtained without and with intravenous contrast. CONTRAST:  91mL GADAVIST GADOBUTROL 1 MMOL/ML IV SOLN COMPARISON:  MRI 12/18/2019 FINDINGS: Technical note: Motion degraded examination. MRI CERVICAL SPINE FINDINGS Alignment: Straightening of the cervical lordosis. Vertebrae: Fluid signal within the C7-T1 disc space and interval collapse of disc height with loss of definition of the vertebral body endplates compatible with discitis. There is low T1 signal changes with high T2 signal and enhancement within the C7, T1, T2, and T3 vertebral bodies compatible with osteomyelitis. Cord: Enhancing material within the epidural space of the lower cervical spine extending cranially to approximately the C4 level. Posterior Fossa, vertebral arteries, paraspinal tissues: Extensive soft tissue edema and enhancement of the posterior paraspinal soft tissues. There is prevertebral edema and enhancement anterior to the cervicothoracic junction. Right apical pleuroparenchymal thickening/scarring. Disc levels: Multilevel degenerative changes  of the cervical spine including multiple levels of mild-to-moderate canal stenosis. Canal stenosis appears moderate at C3-4 and C4-5. MRI THORACIC SPINE FINDINGS Alignment:  Physiologic. Vertebrae:  Osteomyelitis of the T1, T2, and T3 vertebral bodies. Cord: Enhancing collection in the posterior epidural space extends to the T5 level (series 34, image 10). Circumferential enhancing epidural collection at the T2 level extending cranially into the cervical spine to approximately the C4 level (series 34, image 9). Enhancing material involves the bilateral neural foramina at T1 through T4. There is prevertebral phlegmonous changes anteriorly centered at the cervicothoracic junction. Paraspinal and other soft tissues: Large rim enhancing collection in the posterior paraspinal soft tissues at laminectomy site in the upper thoracic spine extending to the posterior epidural space with enhancing phlegmonous soft tissue extending into the epidural space of the cervical and thoracic spines. Posterior paraspinal collection measures up to 4.1 x 3.3 cm trans axially by 8.8 cm cranial caudally. There is marked surrounding soft tissue edema and enhancement suggesting cellulitis. Disc levels: No high-grade foraminal or canal stenosis of the thoracic spine. IMPRESSION: 1. C7-T1 discitis-osteomyelitis. There is acute osteomyelitis also involving the T2 and T3 vertebral bodies. 2. Enhancing epidural phlegmon/abscess extending from approximately C4 through T5. 3. Rim-enhancing fluid collection at the laminectomy bed abutting the posterior epidural space. Collection measures approximately 9 x 3 x 4 cm. 4. Anterior prevertebral phlegmonous changes centered at cervicothoracic junction. These results will be called to the ordering clinician or representative by the Radiologist Assistant, and communication documented in the PACS or zVision Dashboard. Electronically Signed   By: Davina Poke D.O.   On: 02/04/2020 09:42   DG Chest Port 1  View  Result Date: 02/04/2020 CLINICAL DATA:  Fever EXAM: PORTABLE CHEST 1 VIEW COMPARISON:  December 21, 2019 FINDINGS: The heart size and mediastinal contours are within normal limits. Both lungs are clear. The visualized skeletal structures are unremarkable. IMPRESSION: No active disease. Electronically Signed   By: Constance Holster M.D.   On: 02/04/2020 00:39   VAS [place] LOWER EXTREMITY VENOUS (DVT)  Result Date: 02/04/2020  Lower Venous DVTStudy Indications: Edema.  Risk Factors: None identified. Limitations: Poor ultrasound/tissue interface. Comparison Study: No prior studies. Performing Technologist: Oliver Hum RVT  Examination Guidelines: A complete evaluation includes B-mode imaging, spectral Doppler, color Doppler, and power Doppler as needed of all accessible portions of each vessel. Bilateral testing is considered an integral part of a complete examination. Limited examinations for reoccurring indications may be performed as noted. The reflux portion of the exam is performed with the patient in reverse Trendelenburg.  +---------+---------------+---------+-----------+----------+--------------+ RIGHT    CompressibilityPhasicitySpontaneityPropertiesThrombus Aging +---------+---------------+---------+-----------+----------+--------------+ CFV      Full           Yes      Yes                                 +---------+---------------+---------+-----------+----------+--------------+  SFJ      Full                                                        +---------+---------------+---------+-----------+----------+--------------+ FV Prox  Full                                                        +---------+---------------+---------+-----------+----------+--------------+ FV Mid   Full                                                        +---------+---------------+---------+-----------+----------+--------------+ FV DistalFull                                                         +---------+---------------+---------+-----------+----------+--------------+ PFV      Full                                                        +---------+---------------+---------+-----------+----------+--------------+ POP      Full           Yes      Yes                                 +---------+---------------+---------+-----------+----------+--------------+ PTV      Full                                                        +---------+---------------+---------+-----------+----------+--------------+ PERO     Full                                                        +---------+---------------+---------+-----------+----------+--------------+   +---------+---------------+---------+-----------+----------+--------------+ LEFT     CompressibilityPhasicitySpontaneityPropertiesThrombus Aging +---------+---------------+---------+-----------+----------+--------------+ CFV      Full           Yes      Yes                                 +---------+---------------+---------+-----------+----------+--------------+ SFJ      Full                                                        +---------+---------------+---------+-----------+----------+--------------+  FV Prox  Full                                                        +---------+---------------+---------+-----------+----------+--------------+ FV Mid   Full                                                        +---------+---------------+---------+-----------+----------+--------------+ FV DistalFull                                                        +---------+---------------+---------+-----------+----------+--------------+ PFV      Full                                                        +---------+---------------+---------+-----------+----------+--------------+ POP      Full           Yes      Yes                                  +---------+---------------+---------+-----------+----------+--------------+ PTV      Full                                                        +---------+---------------+---------+-----------+----------+--------------+ PERO     Full                                                        +---------+---------------+---------+-----------+----------+--------------+     Summary: RIGHT: - There is no evidence of deep vein thrombosis in the lower extremity.  - No cystic structure found in the popliteal fossa.  LEFT: - There is no evidence of deep vein thrombosis in the lower extremity.  - No cystic structure found in the popliteal fossa.  *See table(s) above for measurements and observations. Electronically signed by Harold Barban MD on 02/04/2020 at 47:36:42 PM.    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 male with  74 y.o. man w/ prior epidural abscess, now with recurrent fevers. MRI C/T-spine personally reviewed,  they show some progression of destruction in the 7-1 disc space from presumed discitis with some involvement of the T2/3 bodies now, posterior enhancing epidural collection that extends into the laminectomy bed.  Neurosurgery felt no reason for NS intervention  IR did not find target  Dr. Zada Finders from NS has aspirated superficial abscess  We will restart ancef since this is still undoubtedly MSSA infection  Will plan on 8 weeks IV followed by months of PO abx and repeat MRI along the way          LOS: 1 day   Alcide Evener 02/05/2020, 11:38 AM

## 2020-02-05 NOTE — Consult Note (Addendum)
Neurosurgery Consultation  Reason for Consult: Epidural abscess Referring Physician: Cyndia Skeeters  CC: Fevers  HPI: This is a 74 y.o. man that I previously operated on for a thoracic epidural abscess. He now presents with recurrent fevers after finishing antibiotics. No new weakness, numbness, or parasthesias, no recent change in bowel or bladder function. But he is having continued RUE grip weakness and RUE clumsiness with urinary retention, which he was having preop. No recent use of anti-platelet Rx, is on PPx SQH that he hasn't yet received this morning.    ROS: A 14 point ROS was performed and is negative except as noted in the HPI.   PMHx:  Past Medical History:  Diagnosis Date  . Benign localized prostatic hyperplasia with lower urinary tract symptoms (LUTS)   . Chronic low back pain   . COPD (chronic obstructive pulmonary disease) with emphysema (HCC)    PULMOLOGIST-- DR Tarri Fuller YOUNG  . 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 ED (erectile dysfunction)   . Full dentures   . Gross hematuria   . 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 Hypogonadism male   . Knee pain, bilateral    INTERMITTENT--  MENISCUS  . OA (osteoarthritis)    KNEES  . Peyronie's disease   . Pulmonary nodules followed by dr c. young (pulmologist)   LLL and LUL-- per last CT 10-01-2013  stable and previous right lung nodule not seen  . Thoracic ascending aortic aneurysm (HCC)    STABLE PER LAST CT 10-01-2013  4CM--  ECTATIC   . Urethral stricture   . Urgency of urination   . Vertebral osteomyelitis ([place]) 02/04/2020  . Wears glasses    FamHx:  Family History  Problem Relation Age of Onset  . Emphysema Mother   . Cancer Father        bile duct   SocHx:  reports that he quit smoking about 10 years ago. His smoking use included cigarettes. He has a 12.00 pack-year smoking history. He has never used smokeless  tobacco. He reports current alcohol use of about 3.0 standard drinks of alcohol per week. He reports that he does not use drugs.  Exam: Vital signs in last 24 hours: Temp:  [97.9 F (36.6 C)-101.4 F (38.6 C)] 97.9 F (36.6 C) (02/18 0800) Pulse Rate:  [83-96] 85 (02/18 0800) Resp:  [17-18] 17 (02/18 0800) BP: (127-147)/(68-87) 144/82 (02/18 0800) SpO2:  [93 %-97 %] 96 % (02/18 0800) Weight:  [83 kg] 83 kg (02/17 1029) General: Awake, alert, cooperative, lying in bed in NAD Head: normocephalic and atruamatic HEENT: neck supple Pulmonary: breathing room air comfortably, no evidence of increased work of breathing Cardiac: RRR Abdomen: S NT ND Extremities: warm and well perfused x4 Neuro: AOx3, PERRL, EOMI, FS Strength 5/5 x4 except RUE: D 5/5, B 4+/5, WE 4+/5, T 4/5, G 3/5 SILTx4   Assessment and Plan: 74 y.o. man w/ prior epidural abscess, now with recurrent fevers. MRI C/T-spine personally reviewed, they show some progression of destruction in the 7-1 disc space from presumed discitis with some involvement of the T2/3 bodies now, posterior enhancing epidural collection that extends into the laminectomy bed.    -IR refused drainage, will attempt bedside needle drainage of the superficial collection to allow for culture and cell counts -neurologically stable, I do not see an indication for open / operative surgical intervention at this  time  Addendum: collection aspirated, no purulent material, appeared most consistent with seroma, will send for cell count & culture  [name], MD 02/05/20 8:28 AM [facility]

## 2020-02-05 NOTE — TOC Initial Note (Signed)
Transition of Care [facility]) - Initial/Assessment Note    Patient Details  Name: Kent Flowers MRN: [id number] Date of Birth: 19-Mar-1946  Transition of Care [facility]) CM/SW Contact:    Alexander Mt, LCSW Phone Number: 02/05/2020, 1:52 PM  Clinical Narrative:                 CSW spoke with pt at bedside. Introduced self, role, reason for visit. Pt from home with his spouse Kent Flowers. Confirmed address, PCP, and wifes phone number. Pt has been home with home health services through [facility] and his wife has been assisting with home iv abx. Pt would like to return home at discharge, he states that he felt good continuing with [facility] as able but is interested in getting assistance with at least some of his home infusions to give his wife a break from doing them all. He is willing to private pay as necessary. CSW explained that I would reach out to our infusion coordinator Pam to see what options are available and would re-refer to [facility].   CSW called and gave referral again to [facility] with [place]. Will also f/u with Pam regarding additional assistance services for iv abx.   Expected Discharge Plan: [place] Barriers to Discharge: Continued Medical Work up, Other (comment)(clarification of iv abx plan)   Patient Goals and CMS Choice Patient states their goals for this hospitalization and ongoing recovery are:: to go home and have assistance with home abx CMS Medicare.gov Compare Post Acute Care list provided to:: (pt would like to continue with [facility]) Choice offered to / list presented to : Patient  Expected Discharge Plan and Services Expected Discharge Plan: [place] In-house Referral: Clinical Social Work Discharge Planning Services: CM Consult Post Acute Care Choice: Durable Medical Equipment, Home Health, Resumption of Svcs/PTA Provider Living arrangements for the past 2 months: Single Family Home    Prior Living  Arrangements/Services Living arrangements for the past 2 months: Single Family Home Lives with:: Spouse Patient language and need for interpreter reviewed:: Yes(no needs) Do you feel safe going back to the place where you live?: Yes      Need for Family Participation in Patient Care: Yes (Comment)(asssitance with cares/iv abx) Care giver support system in place?: Yes (comment)(spouse/adult children; HH) Current home services: DME Criminal Activity/Legal Involvement Pertinent to Current Situation/Hospitalization: No - Comment as needed  Activities of Daily Living Home Assistive Devices/Equipment: Eyeglasses, Environmental consultant (specify type) ADL Screening (condition at time of admission) Patient's cognitive ability adequate to safely complete daily activities?: Yes Is the patient deaf or have difficulty hearing?: No Does the patient have difficulty seeing, even when wearing glasses/contacts?: No Does the patient have difficulty concentrating, remembering, or making decisions?: No Patient able to express need for assistance with ADLs?: Yes Does the patient have difficulty dressing or bathing?: No Independently performs ADLs?: Yes (appropriate for developmental age) Does the patient have difficulty walking or climbing stairs?: Yes Weakness of Legs: Both Weakness of Arms/Hands: None  Permission Sought/Granted Permission sought to share information with : Family Supports, Other (comment) Permission granted to share information with : Yes, Verbal Permission Granted  Share Information with NAME: Khaliel Lenn  Permission granted to share info w AGENCY: Home Infusion/[place]  Permission granted to share info w Relationship: spouse  Permission granted to share info w Contact Information: [phone]  Emotional Assessment Appearance:: Appears stated age Attitude/Demeanor/Rapport: Engaged Affect (typically observed): Accepting, Adaptable, Appropriate Orientation: : Oriented to  Place, Oriented to  Self, Oriented to  Time, Oriented to Situation Alcohol / Substance Use: Not Applicable Psych Involvement: No (comment)  Admission diagnosis:  Sepsis ([place]) [A41.9] Fever, unspecified fever cause [R50.9] Patient Active Problem List   Diagnosis Date Noted  . Epidural abscess   . Sepsis ([place]) 02/04/2020  . Leg swelling 02/04/2020  . Vertebral osteophyte 02/04/2020  . 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 Pulmonary mass 01/22/2020  . PICC (peripherally inserted central catheter) in place 01/22/2020  . Paresthesia of right upper extremity 12/18/2019  . 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 Status post laminectomy 12/18/2019  . Insomnia 09/22/2019  . Neck pain 11/04/2017  . Other social stressor 07/19/2016  . COPD with acute bronchitis ([place]) 10/04/2014  . Hepatitis, unspecified 03/13/2014  . Chronic low back pain   . Knee pain, bilateral   . Right knee DJD   . Hepatitis 11/03/2013  . 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 Aortic calcification ([place]) 11/03/2013  . Coronary atherosclerosis of native coronary artery 11/03/2013  . Penile neoplasm   . Ascending aortic aneurysm ([place])   . Lung nodule 05/23/2012  . COPD mixed type ([place]) 02/24/2012   PCP:  Carylon Perches, NP Pharmacy:   [facility], [place] - [address] [place] [address] Phone: [phone] Fax: [phone]   Readmission Risk Interventions Readmission Risk Prevention Plan 02/05/2020  Transportation Screening Complete  PCP or Specialist Appt within 3-5 Days Not Complete  Not Complete comments pending medical stability  HRI or Home Care Consult Complete  Social Work Consult for [place] Planning/Counseling Complete  Palliative Care Screening Not Applicable  Medication Review (RN Care Manager) Referral to Pharmacy  Some recent data might be hidden

## 2020-02-05 NOTE — Plan of Care (Signed)

## 2020-02-05 NOTE — Procedures (Signed)
PREOP DIAGNOSIS:  Superficial post-operative fluid collection   POSTOP DIAGNOSIS: Same  PROCEDURE:Bedside needle drainage of post-op fluid collection  SURGEON: Emelda Brothers, MD  ASSISTANT: None  ANESTHESIA: IV sedation with local  EBL: None  SPECIMENS: Thoracic fluid collection for culture  COMPLICATIONS: none  PROCEDURE IN DETAIL: Pt positioned, 1.0mg  of hydromorphone IV used for procedural analgesia, area prepped x3 & draped in sterile fashion, 86mL of 1% lidocaine used for local anesthesia. Spinal needle advanced in a sterile fashion to the left of midline into fluid collection. 10cc of red-straw colored fluid was aspirated and then transferred into a sterile culture specimen tube. Needle was withdrawn, no complications apparent, pt's neurologic exam stable during procedure and immediately post-procedure.

## 2020-02-05 NOTE — Progress Notes (Signed)
PROGRESS NOTE  Kent Flowers [id number] DOB: 1946/11/14   PCP: Carylon Perches, NP  Patient is from: home  DOA: 02/03/2020 LOS: 1  Brief Narrative / Interim history: 74 year old male with history of COPD, right apical lung mass, chronic pain on chronic opiate, recent hospitalization for MSSA bacteremia and epidural abscess who completed antibiotic course on 01/28/2020 returning today with fever and general malaise.  In ED, febrile to 38.6 C.  Slightly tachycardic.  Soft blood pressures but within normal.  Creatinine 1.38 (1.0 about a week ago).  COVID-19 PCR negative.  CRP 19.2.  LA 0.7.  WBC 13K.  Hgb 9.2.  Blood and urine cultures collected.  ID consulted.  MRI thoracic spine ordered and hospitalist service was called for admission.  Patient is admitted with working diagnosis of sepsis likely from cervicothoracic infection.  ID recommended holding off antibiotics pending tissue cultures.   MRI cervical and thoracic spine revealed C7-T1 discitis/osteomyelitis, T2 and T3 vertebral body osteomyelitis, enhancing epidural phlegmon/abscess extending from approximately T4-T5, rim-enhancing fluid collection at the laminectomy bed abutting the posterior epidural space measuring 9 x 3 x 4 cm and anterior prevertebral phlegmonous changes centered at the cervicothoracic junction.  Neurosurgery, Dr. Ronnald Ramp consulted and recommended IR drainage of the fluid. However, IR suggested neurosurgery to drain the epidural abscess. I have reconsulted neurosurgery again.   Subjective: Febrile to 101.4 about 4 AM this morning.  He says he woke up stiff not able to move because he did not get his pain medication overnight.  He denies chest pain, dyspnea, GI or UTI symptoms.   Objective: Vitals:   02/04/20 1957 02/05/20 0351 02/05/20 0748 02/05/20 0800  BP: 127/75 (!) 146/79  (!) 144/82  Pulse: 94 96  85  Resp: _0 Temp: 99.9 F (37.7 C) (!) 101.4 F (38.6 C)  97.9 F (36.6 C)  TempSrc: Oral Oral   Oral  SpO2: 97% 93% 95% 96%  Weight:        Intake/Output Summary (Last 24 hours) at 02/05/2020 1127 Last data filed at 02/05/2020 5615 Gross per 24 hour  Intake --  Output 900 ml  Net -900 ml   Filed Weights   02/04/20 1029  Weight: 83 kg    Examination:  GENERAL: No acute distress.  Appears well.  HEENT: MMM.  Vision and hearing grossly intact.  NECK: Supple.  No apparent JVD.  RESP:  No IWOB. Good air movement bilaterally. CVS:  RRR. Heart sounds normal.  ABD/GI/GU: Bowel sounds present. Soft. Non tender.  MSK/EXT:  Moves extremities. No apparent deformity.  2+ pitting edema bilaterally SKIN: no apparent skin lesion or wound NEURO: Awake, alert and oriented appropriately.  No apparent focal neuro deficit other than inability to form a grip with right hand PSYCH: Calm. Normal affect.   Procedures:  MRI cervical and thoracic spine C7-T1 discitis/osteomyelitis, T2 and T3 vertebral body osteomyelitis, enhancing epidural phlegmon/abscess extending from approximately T4-T5, rim-enhancing fluid collection at the laminectomy bed abutting the posterior epidural space measuring 9 x 3 x 4 cm and anterior prevertebral phlegmonous changes centered at the cervicothoracic junction.  Assessment & Plan: Sepsis due to cervicothoracic discitis/osteomyelitis/abscess as noted in MRI above-hemodynamically stable.  CRP and ESR elevated.  Blood cultures negative.  No new focal neurological deficit. -Appreciate ID and neurosurgery help and guidance -Bedside needle aspiration of fluid collection by neurosurgery on 2/18 -IV Ancef per ID 2/18>> -Pain control with home oxycodone -Bowel regimen -Follow cultures and fever curve  Bilateral extremity  edema: Basically normal echo on 12/19/2019.  Bilateral LE Doppler negative for DVT.  Albumin 2.4.  TSH normal.  Hgb 9.2.  Urine protein 100.  Patient has concerning right apical lung mass.  -Hold off diuretics for now given AKI. -Leg elevation -TED hose if  he tolerates.  Chronic COPD: Stable -As needed breathing treatments  Right apical lung mass: Scheduled for PET scan on 02/10/2019. -Outpatient follow-up  AKI: Baseline Cr 0.6-0.8> 1.03 (2/8)> 1.38 (admit)> 1.29.  BUN within normal.  On low-dose enalapril and Demadex.  Denies NSAID use. -Hold home Demadex and enalapril. -Continue monitoring  Iron and vitamin B12 deficiency anemia: Baseline Hgb 10-12> 9.2 (admit)> 9.9.  Iron saturation 3%.  Vitamin B12 is 131. -Replenish vitamin B12 with IV and p.o. -IV Feraheme in the next 48 to 72 hours -Continue monitoring  Chronic pain on chronic opiates -Resumed home oxycodone. -Bowel regimen  GERD: -PPI     Nutrition Problem: Increased nutrient needs Etiology: post-op healing  Signs/Symptoms: estimated needs  Interventions: Ensure Enlive (each supplement provides 350kcal and 20 grams of protein), MVI   DVT prophylaxis: Subcu heparin Code Status: DNR/DNI Family Communication: Updated patient's daughter and daughter-in-law over the phone.  Discharge barrier: Sepsis/discitis/osteomyelitis/epidural abscess Patient is from: Home Final disposition: To be determined based on clinical progress  Consultants: ID, neurosurgery   Microbiology summarized: COVID-19 negative Blood culture negative Urine culture with multiple species   Sch Meds:  Scheduled Meds: . albuterol  2 puff Inhalation BID  . Chlorhexidine Gluconate Cloth  6 each Topical Daily  . cyanocobalamin  1,000 mcg Intramuscular Once  . feeding supplement (ENSURE ENLIVE)  237 mL Oral BID BM  . folic acid  1 mg Oral Daily  . gabapentin  300 mg Oral TID  . heparin injection (subcutaneous)  5,000 Units Subcutaneous Q8H  . HYDROmorphone      . lidocaine (PF)      . lidocaine  5 mL Intradermal Once  . multivitamin with minerals  1 tablet Oral Daily  . oxyCODONE  10 mg Oral Q4H while awake  . pantoprazole  40 mg Oral Daily  . sodium chloride flush  3 mL Intravenous Q12H  .  sodium chloride flush  3 mL Intravenous Q12H  . vitamin B-12  1,000 mcg Oral Daily   Continuous Infusions: . sodium chloride    .  ceFAZolin (ANCEF) IV     PRN Meds:.sodium chloride, acetaminophen **OR** acetaminophen, albuterol, ondansetron **OR** ondansetron (ZOFRAN) IV, senna-docusate, sodium chloride flush  Antimicrobials: Anti-infectives (From admission, onward)   Start     Dose/Rate Route Frequency Ordered Stop   02/05/20 1400  ceFAZolin (ANCEF) IVPB 2g/100 mL premix     2 g 200 mL/hr over 30 Minutes Intravenous Every 8 hours 02/05/20 0918         I have personally reviewed the following labs and images: CBC: Recent Labs  Lab 02/04/20 0128 02/05/20 0214  WBC 13.1* 13.3*  NEUTROABS 9.4* 9.2*  HGB 9.2* 9.9*  HCT 28.9* 31.1*  MCV 95.7 94.0  PLT 365 412*   BMP &GFR Recent Labs  Lab 02/04/20 0128 02/05/20 0214  NA 135 137  K 4.2 4.2  CL 95* 97*  CO2 28 28  GLUCOSE 142* 113*  BUN 19 20  CREATININE 1.38* 1.29*  CALCIUM 8.3* 8.5*   Estimated Creatinine Clearance: 54.3 mL/min (A) (by C-G formula based on SCr of 1.29 mg/dL (H)). Liver & Pancreas: Recent Labs  Lab 02/04/20 0128 02/05/20 0214  AST 22  22  ALT 13 13  ALKPHOS 73 70  BILITOT 0.8 0.8  PROT 7.0 6.7  ALBUMIN 2.4* 2.3*   No results for input(s): LIPASE, AMYLASE in the last 168 hours. No results for input(s): AMMONIA in the last 168 hours. Diabetic: No results for input(s): HGBA1C in the last 72 hours. No results for input(s): GLUCAP in the last 168 hours. Cardiac Enzymes: No results for input(s): CKTOTAL, CKMB, CKMBINDEX, TROPONINI in the last 168 hours. No results for input(s): PROBNP in the last 8760 hours. Coagulation Profile: No results for input(s): INR, PROTIME in the last 168 hours. Thyroid Function Tests: Recent Labs    02/04/20 0128  TSH 3.834   Lipid Profile: No results for input(s): CHOL, HDL, LDLCALC, TRIG, CHOLHDL, LDLDIRECT in the last 72 hours. Anemia Panel: Recent Labs     02/05/20 0214  VITAMINB12 131*  FOLATE 34.8  FERRITIN 925*  TIBC 167*  IRON 13*  RETICCTPCT 1.7   Urine analysis:    Component Value Date/Time   COLORURINE YELLOW 02/04/2020 0151   APPEARANCEUR CLOUDY (A) 02/04/2020 0151   LABSPEC 1.010 02/04/2020 0151   PHURINE 7.0 02/04/2020 0151   GLUCOSEU NEGATIVE 02/04/2020 0151   HGBUR NEGATIVE 02/04/2020 0151   BILIRUBINUR NEGATIVE 02/04/2020 0151   KETONESUR NEGATIVE 02/04/2020 0151   PROTEINUR 100 (A) 02/04/2020 0151   NITRITE NEGATIVE 02/04/2020 0151   LEUKOCYTESUR LARGE (A) 02/04/2020 0151   Sepsis Labs: Invalid input(s): PROCALCITONIN, [place]  Microbiology: Recent Results (from the past 240 hour(s))  Urine culture     Status: Abnormal   Collection Time: 02/04/20 12:13 AM   Specimen: Urine, Random  Result Value Ref Range Status   Specimen Description URINE, RANDOM  Final   Special Requests   Final    NONE Performed at [facility], [address] [place], [place] [address]    Culture MULTIPLE SPECIES PRESENT, SUGGEST RECOLLECTION (A)  Final   Report Status 02/05/2020 FINAL  Final  Blood culture (routine x 2)     Status: None (Preliminary result)   Collection Time: 02/04/20  1:28 AM   Specimen: BLOOD  Result Value Ref Range Status   Specimen Description BLOOD RIGHT ARM  Final   Special Requests   Final    BOTTLES DRAWN AEROBIC AND ANAEROBIC Blood Culture results may not be optimal due to an inadequate volume of blood received in culture bottles   Culture   Final    NO GROWTH 1 DAY Performed at [facility], [place] [address], [place], [place] [address]    Report Status PENDING  Incomplete  Blood culture (routine x 2)     Status: None (Preliminary result)   Collection Time: 02/04/20  1:28 AM   Specimen: BLOOD  Result Value Ref Range Status   Specimen Description BLOOD RIGHT HAND  Final   Special Requests   Final    BOTTLES DRAWN AEROBIC AND ANAEROBIC Blood Culture adequate volume   Culture   Final     NO GROWTH 1 DAY Performed at [facility], [place] [address], [place], [place] [address]    Report Status PENDING  Incomplete  Respiratory Panel by RT PCR (Flu A&B, Covid) - Urine, Clean Catch     Status: None   Collection Time: 02/04/20  1:36 AM   Specimen: Urine, Clean Catch  Result Value Ref Range Status   SARS Coronavirus 2 by RT PCR NEGATIVE NEGATIVE Final    Comment: (NOTE) SARS-CoV-2 target nucleic acids  are NOT DETECTED. The SARS-CoV-2 RNA is generally detectable in upper respiratoy specimens during the acute phase of infection. The lowest concentration of SARS-CoV-2 viral copies this assay can detect is 131 copies/mL. A negative result does not preclude SARS-Cov-2 infection and should not be used as the sole basis for treatment or other patient management decisions. A negative result may occur with  improper specimen collection/handling, submission of specimen other than nasopharyngeal swab, presence of viral mutation(s) within the areas targeted by this assay, and inadequate number of viral copies (<131 copies/mL). A negative result must be combined with clinical observations, patient history, and epidemiological information. The expected result is Negative. Fact Sheet for Patients:  PinkCheek.be Fact Sheet for Healthcare Providers:  GravelBags.it This test is not yet ap proved or cleared by the [place] FDA and  has been authorized for detection and/or diagnosis of SARS-CoV-2 by FDA under an Emergency Use Authorization (EUA). This EUA will remain  in effect (meaning this test can be used) for the duration of the COVID-19 declaration under Section 564(b)(1) of the Act, 21 U.S.C. section 360bbb-3(b)(1), unless the authorization is terminated or revoked sooner.    Influenza A by PCR NEGATIVE NEGATIVE Final   Influenza B by PCR NEGATIVE NEGATIVE Final    Comment: (NOTE) The Xpert Xpress  SARS-CoV-2/FLU/RSV assay is intended as an aid in  the diagnosis of influenza from Nasopharyngeal swab specimens and  should not be used as a sole basis for treatment. Nasal washings and  aspirates are unacceptable for Xpert Xpress SARS-CoV-2/FLU/RSV  testing. Fact Sheet for Patients: PinkCheek.be Fact Sheet for Healthcare Providers: GravelBags.it This test is not yet approved or cleared by the [place] FDA and  has been authorized for detection and/or diagnosis of SARS-CoV-2 by  FDA under an Emergency Use Authorization (EUA). This EUA will remain  in effect (meaning this test can be used) for the duration of the  Covid-19 declaration under Section 564(b)(1) of the Act, 21  U.S.C. section 360bbb-3(b)(1), unless the authorization is  terminated or revoked. Performed at [facility], [place] [address] [place], [place] [address]     Radiology Studies: VAS [place] LOWER EXTREMITY VENOUS (DVT)  Result Date: 02/04/2020  Lower Venous DVTStudy Indications: Edema.  Risk Factors: None identified. Limitations: Poor ultrasound/tissue interface. Comparison Study: No prior studies. Performing Technologist: Oliver Hum RVT  Examination Guidelines: A complete evaluation includes B-mode imaging, spectral Doppler, color Doppler, and power Doppler as needed of all accessible portions of each vessel. Bilateral testing is considered an integral part of a complete examination. Limited examinations for reoccurring indications may be performed as noted. The reflux portion of the exam is performed with the patient in reverse Trendelenburg.  +---------+---------------+---------+-----------+----------+--------------+ RIGHT    CompressibilityPhasicitySpontaneityPropertiesThrombus Aging +---------+---------------+---------+-----------+----------+--------------+ CFV      Full           Yes      Yes                                  +---------+---------------+---------+-----------+----------+--------------+ SFJ      Full                                                        +---------+---------------+---------+-----------+----------+--------------+ FV Prox  Full                                                        +---------+---------------+---------+-----------+----------+--------------+  FV Mid   Full                                                        +---------+---------------+---------+-----------+----------+--------------+ FV DistalFull                                                        +---------+---------------+---------+-----------+----------+--------------+ PFV      Full                                                        +---------+---------------+---------+-----------+----------+--------------+ POP      Full           Yes      Yes                                 +---------+---------------+---------+-----------+----------+--------------+ PTV      Full                                                        +---------+---------------+---------+-----------+----------+--------------+ PERO     Full                                                        +---------+---------------+---------+-----------+----------+--------------+   +---------+---------------+---------+-----------+----------+--------------+ LEFT     CompressibilityPhasicitySpontaneityPropertiesThrombus Aging +---------+---------------+---------+-----------+----------+--------------+ CFV      Full           Yes      Yes                                 +---------+---------------+---------+-----------+----------+--------------+ SFJ      Full                                                        +---------+---------------+---------+-----------+----------+--------------+ FV Prox  Full                                                         +---------+---------------+---------+-----------+----------+--------------+ FV Mid   Full                                                        +---------+---------------+---------+-----------+----------+--------------+  FV DistalFull                                                        +---------+---------------+---------+-----------+----------+--------------+ PFV      Full                                                        +---------+---------------+---------+-----------+----------+--------------+ POP      Full           Yes      Yes                                 +---------+---------------+---------+-----------+----------+--------------+ PTV      Full                                                        +---------+---------------+---------+-----------+----------+--------------+ PERO     Full                                                        +---------+---------------+---------+-----------+----------+--------------+     Summary: RIGHT: - There is no evidence of deep vein thrombosis in the lower extremity.  - No cystic structure found in the popliteal fossa.  LEFT: - There is no evidence of deep vein thrombosis in the lower extremity.  - No cystic structure found in the popliteal fossa.  *See table(s) above for measurements and observations. Electronically signed by Harold Barban MD on 02/04/2020 at 6:36:42 PM.    Final      Seanpatrick [employer]  If 7PM-7AM, please contact night-coverage [email or web address] Password [facility] 02/05/2020, 11:27 AM

## 2020-02-05 NOTE — Social Work (Signed)
Was contacted by [facility], they state they are unable to staff referral at this time. CSW stopped by pt room again to provide [facility] list, pt busy, will return as able.   Westley Hummer, MSW, [place] Work

## 2020-02-05 NOTE — Progress Notes (Signed)
PHARMACY CONSULT NOTE FOR:  OUTPATIENT  PARENTERAL ANTIBIOTIC THERAPY (OPAT)  Indication: Discitis/osteomyelitis/epidural abscess Regimen: Cefazolin 2 gm every 8 hours End date: 04/01/20  IV antibiotic discharge orders are pended. To discharging provider:  please sign these orders via discharge navigator,  Select New Orders & click on the button choice - Manage This Unsigned Work.     Thank you for allowing pharmacy to be a part of this patient's care.  Jimmy Footman, PharmD, BCPS, BCIDP Infectious Diseases Clinical Pharmacist Phone: [phone] 02/05/2020, 10:45 AM

## 2020-02-06 ENCOUNTER — Inpatient Hospital Stay: Payer: Self-pay

## 2020-02-06 DIAGNOSIS — R5381 Other malaise: Secondary | ICD-10-CM

## 2020-02-06 DIAGNOSIS — M4622 Osteomyelitis of vertebra, cervical region: Secondary | ICD-10-CM

## 2020-02-06 DIAGNOSIS — R32 Unspecified urinary incontinence: Secondary | ICD-10-CM

## 2020-02-06 DIAGNOSIS — Z8739 Personal history of other diseases of the musculoskeletal system and connective tissue: Secondary | ICD-10-CM

## 2020-02-06 DIAGNOSIS — M4642 Discitis, unspecified, cervical region: Secondary | ICD-10-CM

## 2020-02-06 LAB — UREA NITROGEN, URINE: Urea Nitrogen, Ur: 266 mg/dL

## 2020-02-06 LAB — RENAL FUNCTION PANEL
Albumin: 2.1 g/dL — ABNORMAL LOW (ref 3.5–5.0)
Anion gap: 10 (ref 5–15)
BUN: 19 mg/dL (ref 8–23)
CO2: 29 mmol/L (ref 22–32)
Calcium: 8.3 mg/dL — ABNORMAL LOW (ref 8.9–10.3)
Chloride: 99 mmol/L (ref 98–111)
Creatinine, Ser: 1.3 mg/dL — ABNORMAL HIGH (ref 0.61–1.24)
GFR calc Af Amer: >60 mL/min (ref >60)
GFR calc non Af Amer: 55 mL/min — ABNORMAL LOW (ref >60)
Glucose, Bld: 166 mg/dL — ABNORMAL HIGH (ref 70–99)
Phosphorus: 3.7 mg/dL (ref 2.5–4.6)
Potassium: 4 mmol/L (ref 3.5–5.1)
Sodium: 138 mmol/L (ref 135–145)

## 2020-02-06 LAB — CBC
HCT: 29.1 % — ABNORMAL LOW (ref 39.0–52.0)
Hemoglobin: 9.4 g/dL — ABNORMAL LOW (ref 13.0–17.0)
MCH: 30.7 pg (ref 26.0–34.0)
MCHC: 32.3 g/dL (ref 30.0–36.0)
MCV: 95.1 fL (ref 80.0–100.0)
Platelets: 422 10*3/uL — ABNORMAL HIGH (ref 150–400)
RBC: 3.06 MIL/uL — ABNORMAL LOW (ref 4.22–5.81)
RDW: 13.1 % (ref 11.5–15.5)
WBC: 13.4 10*3/uL — ABNORMAL HIGH (ref 4.0–10.5)
nRBC: 0 % (ref 0.0–0.2)

## 2020-02-06 LAB — MAGNESIUM: Magnesium: 1.9 mg/dL (ref 1.7–2.4)

## 2020-02-06 LAB — PATHOLOGIST SMEAR REVIEW

## 2020-02-06 MED ORDER — SODIUM CHLORIDE 0.9 % IV SOLN
510.0000 mg | Freq: Once | INTRAVENOUS | Status: AC
  Administered 2020-02-06: 510 mg via INTRAVENOUS
  Filled 2020-02-06: qty 17

## 2020-02-06 MED ORDER — SENNOSIDES-DOCUSATE SODIUM 8.6-50 MG PO TABS: 1.0000 | ORAL_TABLET | Freq: Every evening | ORAL | 1 refills | Status: DC | PRN

## 2020-02-06 MED ORDER — NAFCILLIN IV (FOR PTA / DISCHARGE USE ONLY): 12.0000 g | INTRAVENOUS | 0 refills | Status: AC

## 2020-02-06 MED ORDER — SODIUM CHLORIDE 0.9 % IV SOLN
12.0000 g | INTRAVENOUS | Status: DC
  Administered 2020-02-06: 12 g via INTRAVENOUS
  Filled 2020-02-06 (×2): qty 12000

## 2020-02-06 MED ORDER — ADULT MULTIVITAMIN W/MINERALS CH: 1.0000 | ORAL_TABLET | Freq: Every day | ORAL | Status: DC

## 2020-02-06 MED ORDER — CYANOCOBALAMIN 1000 MCG PO TABS: 1000.0000 ug | ORAL_TABLET | Freq: Every day | ORAL | 1 refills | Status: DC

## 2020-02-06 MED ORDER — FERROUS SULFATE 325 (65 FE) MG PO TBEC: 325.0000 mg | DELAYED_RELEASE_TABLET | Freq: Two times a day (BID) | ORAL | 1 refills | Status: DC

## 2020-02-06 MED ORDER — SODIUM CHLORIDE 0.9% FLUSH: 10.0000 mL | INTRAVENOUS | Status: DC | PRN

## 2020-02-06 MED ORDER — CEFAZOLIN IV (FOR PTA / DISCHARGE USE ONLY): 2.0000 g | Freq: Three times a day (TID) | INTRAVENOUS | 0 refills | Status: DC

## 2020-02-06 MED ORDER — ENSURE ENLIVE PO LIQD: 237.0000 mL | Freq: Two times a day (BID) | ORAL | 1 refills | Status: AC

## 2020-02-06 NOTE — Plan of Care (Signed)

## 2020-02-06 NOTE — Discharge Summary (Signed)
Physician Discharge Summary  Kent Flowers TGY:[id number] DOB: 08-11-1946 DOA: 02/03/2020  PCP: Carylon Perches, NP  Admit date: 02/03/2020 Discharge date: 02/06/2020  Admitted From: Home Disposition: Home  Recommendations for Outpatient Follow-up:  1. Follow ups as below. 2. Please obtain CBC/BMP/Mag at follow up 3. Please follow up on the following pending results: None  Home Health: PT/OT Equipment/Devices: None  Discharge Condition: Stable CODE STATUS: DNR/DNI   Hospital Course: 74 year old male with history of COPD, right apical lung mass, chronic pain on chronic opiate, recent hospitalization for MSSA bacteremia and epidural abscess who completed antibiotic course on 01/28/2020 returning today with fever and general malaise.  In ED, febrile to 38.6 C.  Slightly tachycardic.  Soft blood pressures but within normal.  Creatinine 1.38 (1.0 about a week ago).  COVID-19 PCR negative.  CRP 19.2.  LA 0.7.  WBC 13K.  Hgb 9.2.  Blood and urine cultures collected.  ID consulted.  MRI thoracic spine ordered and hospitalist service was called for admission.  Patient is admitted with working diagnosis of sepsis likely from cervicothoracic infection.  ID recommended holding off antibiotics pending tissue cultures.   MRI cervical and thoracic spine revealed C7-T1 discitis/osteomyelitis, T2 and T3 vertebral body osteomyelitis, enhancing epidural phlegmon/abscess extending from approximately T4-T5, rim-enhancing fluid collection at the laminectomy bed abutting the posterior epidural space measuring 9 x 3 x 4 cm and anterior prevertebral phlegmonous changes centered at the cervicothoracic junction.  Neurosurgery, Dr. Zada Finders consulted and did bedside needle aspiration of fluid collection on 02/05/2020.  Fuid "most consistent with postop seroma". Fluid cytology neutrophilic.  Cultures negative so far.  Per neurosurgery, no indication for open or operative surgical intervention.  Patient was  started on IV Ancef by infectious disease and cleared for discharge on IV Ancef for 8 weeks followed by oral antibiotic for a month this.  ID recommended repeat MRI along the way.  Outpatient follow-up scheduled for 03/08/2020 at 10:15 AM with Dr. Tommy Medal.  Patient had PICC line placed and discharged with home health PT/OT/RN.  Patient's daughter updated on the day of discharge.  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  CRP and ESR elevated.  Blood cultures negative.  No new focal neurological deficit. -Bedside needle aspiration of the fluid/seroma by neurosurgery on 2/18.  Fluid is neutrophilic.  Culture negative.  Per neurosurgery, no indication for surgical intervention. -ID recommendeds:  -IV Ancef for a total of 8 weeks from 02/05/2020 followed by oral antibiotic for months.  -Weekly BMP and CBC with differential, and biweekly ESR and CRP along the way  -Repeat MRI along the way.  -Follow-up with ID point 03/08/2020 at 10:15 AM -Pain control with home oxycodone -Added bowel regimen  Bilateral extremity edema: Basically normal echo on 12/19/2019.  Bilateral LE Doppler negative for DVT.  Albumin 2.4.  TSH normal.  Hgb 9.2.  Urine protein 100.  Patient has concerning right apical lung mass.  Edema improved with TED hose and lower extremity elevation. -Encourage TED hose and lower extremity elevation -Discontinue diuretics in the setting of AKI-diuretics usually not effective in the situation.  Chronic COPD: Stable -Discharged on home breathing treatments.  Right apical lung mass: Scheduled for PET scan on 02/10/2019. -Outpatient follow-up  AKI: Baseline Cr 0.6-0.8> 1.03 (2/8)> 1.38 (admit)> 1.30.  BUN within normal.  On low-dose enalapril and Demadex.  Denies NSAID use. -Discontinued home Demadex and enalapril -Recheck BMP in a week  Iron and vitamin  B12 deficiency anemia: Baseline Hgb  10-12> 9.2 (admit)> 9.4.  Iron saturation 3%.  Vitamin B12 is 131. -Gave IV Feraheme prior to discharge.  Received IV vitamin B12 1000 mcg -Discharged on oral iron and oral vitamin B12 -Senokot-S as needed for constipation  Chronic pain on chronic opiates -Discharged on home oxycodone.  Added bowel regimen.  Urinary incontinence: family concerned about this.  However, patient believes this is more psychological issue after prolonged condom catheter and diaper use in the setting of lung disease hospitalization previously.  He says he always have weak stream since young age.  He denies dysuria and hematuria.  -Encouraged using urinal and retraining.  Generalized weakness/debility -Resume home PT/OT.  GERD: -PPI  Nutrition Problem: Increased nutrient needs Etiology: post-op healing  Signs/Symptoms: estimated needs  Interventions: Ensure Enlive (each supplement provides 350kcal and 20 grams of protein), MVI  Discharge Instructions  Discharge Instructions    Call MD for:  difficulty breathing, headache or visual disturbances   Complete by: As directed    Call MD for:  extreme fatigue   Complete by: As directed    Call MD for:  persistant dizziness or light-headedness   Complete by: As directed    Call MD for:  persistant nausea and vomiting   Complete by: As directed    Call MD for:  redness, tenderness, or signs of infection (pain, swelling, redness, odor or green/yellow discharge around incision site)   Complete by: As directed    Call MD for:  severe uncontrolled pain   Complete by: As directed    Diet - low sodium heart healthy   Complete by: As directed    Discharge instructions   Complete by: As directed    It has been a pleasure taking care of you! You were hospitalized with malaise, fever and weakness likely due to infection in your neck.  You were started you on IV antibiotics.  We are discharging you on this IV antibiotics at least for the next 8 weeks.  We have  also started you on vitamin B12 and iron for your anemia. Please review your new medication list and the directions before you take your medications.  Please follow-up with infectious disease doctors as recommended to you.  Take care,   Home infusion instructions   Complete by: As directed    Instructions: Flushing of vascular access device: 0.9% NaCl pre/post medication administration and prn patency; Heparin 100 u/ml, 68m for implanted ports and Heparin 10u/ml, 584mfor all other central venous catheters.   Increase activity slowly   Complete by: As directed      Allergies as of 02/06/2020      Reactions   Ceclor [cefaclor] Rash   Sulfa Antibiotics Rash, Other (See Comments)   SEVERE RASH- childhood allergy      Medication List    STOP taking these medications   enalapril 2.5 MG tablet Commonly known as: VASOTEC   potassium chloride SA 20 MEQ tablet Commonly known as: KLOR-CON   senna 8.6 MG Tabs tablet Commonly known as: SENOKOT   torsemide 10 MG tablet Commonly known as: DEMADEX     TAKE these medications   albuterol 108 (90 Base) MCG/ACT inhaler Commonly known as: ProAir HFA INHALE 2 PUFFS INTO THE LUNGS EVERY 6 HOURS AS NEEDED FOR WHEEZING ORSHORTNESS OF BREATH What changed:   how much to take  how to take this  when to take this  reasons to take this  additional instructions Notes to patient:  Last dose given 02/19 09:15   ceFAZolin  IVPB Commonly known as: ANCEF Inject 2 g into the vein every 8 (eight) hours. Indication:  Discitis/osteomyelitis/epidural abscess Last Day of Therapy:  04/01/20 Labs - Once weekly:  CBC/D and BMP, Labs - Every other week:  ESR and CRP   cyanocobalamin 1000 MCG tablet Take 1 tablet (1,000 mcg total) by mouth daily. Start taking on: February 07, 2020   feeding supplement (ENSURE ENLIVE) Liqd Take 237 mLs by mouth 2 (two) times daily between meals.   ferrous sulfate 325 (65 FE) MG EC tablet Take 1 tablet (325 mg total)  by mouth 2 (two) times daily.   folic acid 1 MG tablet Commonly known as: FOLVITE Take 1 tablet (1 mg total) by mouth daily.   gabapentin 300 MG capsule Commonly known as: NEURONTIN Take 1 capsule (300 mg total) by mouth 3 (three) times daily.   magnesium gluconate 500 MG tablet Commonly known as: MAGONATE Take 500 mg by mouth daily. Notes to patient: Resume home regimen   Melatonin 5 MG Subl Place 5 mg under the tongue at bedtime.   multivitamin with minerals Tabs tablet Take 1 tablet by mouth daily. Start taking on: February 07, 2020   OVER THE COUNTER MEDICATION Take 1 tablet by mouth daily. QC one supplement   Oxycodone HCl 10 MG Tabs Take 1 tablet (10 mg total) by mouth every 4 (four) hours while awake. What changed: how much to take   pantoprazole 40 MG tablet Commonly known as: PROTONIX Take 1 tablet (40 mg total) by mouth daily at 12 noon. Notes to patient: Resume home regimen   senna-docusate 8.6-50 MG tablet Commonly known as: Senokot-S Take 1 tablet by mouth at bedtime as needed for mild constipation.   testosterone cypionate 200 MG/ML injection Commonly known as: DEPOTESTOSTERONE CYPIONATE Inject 180 mg into the muscle every 14 (fourteen) days. Notes to patient: Resume home regimen   thiamine 100 MG tablet Take 1 tablet (100 mg total) by mouth daily. Notes to patient: Resume home regimen            Home Infusion Instuctions  (From admission, onward)         Start     Ordered   02/06/20 0000  Home infusion instructions    Question:  Instructions  Answer:  Flushing of vascular access device: 0.9% NaCl pre/post medication administration and prn patency; Heparin 100 u/ml, 27m for implanted ports and Heparin 10u/ml, 516mfor all other central venous catheters.   02/06/20 1250          Consultations:  Neurosurgery, infectious disease  Procedures/Studies:  Needle aspiration of postop fluid collection   MR CERVICAL SPINE W WO  CONTRAST  Result Date: 02/04/2020 CLINICAL DATA:  MSSA bacteremia. Thoracic epidural abscess status post laminectomy and IV antibiotics. Fever. Elevated serum inflammatory markers EXAM: MRI CERVICAL AND THORACIC SPINE WITHOUT AND WITH CONTRAST TECHNIQUE: Multiplanar and multiecho pulse sequences of the cervical spine, to include the craniocervical junction and cervicothoracic junction, and thoracic spine, were obtained without and with intravenous contrast. CONTRAST:  52m70mADAVIST GADOBUTROL 1 MMOL/ML IV SOLN COMPARISON:  MRI 12/18/2019 FINDINGS: Technical note: Motion degraded examination. MRI CERVICAL SPINE FINDINGS Alignment: Straightening of the cervical lordosis. Vertebrae: Fluid signal within the C7-T1 disc space and interval collapse of disc height with loss of definition of the vertebral body endplates compatible with discitis. There is low T1 signal changes with high T2 signal and enhancement within the C7, T1, T2,  and T3 vertebral bodies compatible with osteomyelitis. Cord: Enhancing material within the epidural space of the lower cervical spine extending cranially to approximately the C4 level. Posterior Fossa, vertebral arteries, paraspinal tissues: Extensive soft tissue edema and enhancement of the posterior paraspinal soft tissues. There is prevertebral edema and enhancement anterior to the cervicothoracic junction. Right apical pleuroparenchymal thickening/scarring. Disc levels: Multilevel degenerative changes of the cervical spine including multiple levels of mild-to-moderate canal stenosis. Canal stenosis appears moderate at C3-4 and C4-5. MRI THORACIC SPINE FINDINGS Alignment:  Physiologic. Vertebrae:  Osteomyelitis of the T1, T2, and T3 vertebral bodies. Cord: Enhancing collection in the posterior epidural space extends to the T5 level (series 34, image 10). Circumferential enhancing epidural collection at the T2 level extending cranially into the cervical spine to approximately the C4 level  (series 34, image 9). Enhancing material involves the bilateral neural foramina at T1 through T4. There is prevertebral phlegmonous changes anteriorly centered at the cervicothoracic junction. Paraspinal and other soft tissues: Large rim enhancing collection in the posterior paraspinal soft tissues at laminectomy site in the upper thoracic spine extending to the posterior epidural space with enhancing phlegmonous soft tissue extending into the epidural space of the cervical and thoracic spines. Posterior paraspinal collection measures up to 4.1 x 3.3 cm trans axially by 8.8 cm cranial caudally. There is marked surrounding soft tissue edema and enhancement suggesting cellulitis. Disc levels: No high-grade foraminal or canal stenosis of the thoracic spine. IMPRESSION: 1. C7-T1 discitis-osteomyelitis. There is acute osteomyelitis also involving the T2 and T3 vertebral bodies. 2. Enhancing epidural phlegmon/abscess extending from approximately C4 through T5. 3. Rim-enhancing fluid collection at the laminectomy bed abutting the posterior epidural space. Collection measures approximately 9 x 3 x 4 cm. 4. Anterior prevertebral phlegmonous changes centered at cervicothoracic junction. These results will be called to the ordering clinician or representative by the Radiologist Assistant, and communication documented in the PACS or zVision Dashboard. Electronically Signed   By: Davina Poke D.O.   On: 02/04/2020 09:42   MR THORACIC SPINE W WO CONTRAST  Result Date: 02/04/2020 CLINICAL DATA:  MSSA bacteremia. Thoracic epidural abscess status post laminectomy and IV antibiotics. Fever. Elevated serum inflammatory markers EXAM: MRI CERVICAL AND THORACIC SPINE WITHOUT AND WITH CONTRAST TECHNIQUE: Multiplanar and multiecho pulse sequences of the cervical spine, to include the craniocervical junction and cervicothoracic junction, and thoracic spine, were obtained without and with intravenous contrast. CONTRAST:  52m GADAVIST  GADOBUTROL 1 MMOL/ML IV SOLN COMPARISON:  MRI 12/18/2019 FINDINGS: Technical note: Motion degraded examination. MRI CERVICAL SPINE FINDINGS Alignment: Straightening of the cervical lordosis. Vertebrae: Fluid signal within the C7-T1 disc space and interval collapse of disc height with loss of definition of the vertebral body endplates compatible with discitis. There is low T1 signal changes with high T2 signal and enhancement within the C7, T1, T2, and T3 vertebral bodies compatible with osteomyelitis. Cord: Enhancing material within the epidural space of the lower cervical spine extending cranially to approximately the C4 level. Posterior Fossa, vertebral arteries, paraspinal tissues: Extensive soft tissue edema and enhancement of the posterior paraspinal soft tissues. There is prevertebral edema and enhancement anterior to the cervicothoracic junction. Right apical pleuroparenchymal thickening/scarring. Disc levels: Multilevel degenerative changes of the cervical spine including multiple levels of mild-to-moderate canal stenosis. Canal stenosis appears moderate at C3-4 and C4-5. MRI THORACIC SPINE FINDINGS Alignment:  Physiologic. Vertebrae:  Osteomyelitis of the T1, T2, and T3 vertebral bodies. Cord: Enhancing collection in the posterior epidural space extends to the T5 level (  series 34, image 10). Circumferential enhancing epidural collection at the T2 level extending cranially into the cervical spine to approximately the C4 level (series 34, image 9). Enhancing material involves the bilateral neural foramina at T1 through T4. There is prevertebral phlegmonous changes anteriorly centered at the cervicothoracic junction. Paraspinal and other soft tissues: Large rim enhancing collection in the posterior paraspinal soft tissues at laminectomy site in the upper thoracic spine extending to the posterior epidural space with enhancing phlegmonous soft tissue extending into the epidural space of the cervical and thoracic  spines. Posterior paraspinal collection measures up to 4.1 x 3.3 cm trans axially by 8.8 cm cranial caudally. There is marked surrounding soft tissue edema and enhancement suggesting cellulitis. Disc levels: No high-grade foraminal or canal stenosis of the thoracic spine. IMPRESSION: 1. C7-T1 discitis-osteomyelitis. There is acute osteomyelitis also involving the T2 and T3 vertebral bodies. 2. Enhancing epidural phlegmon/abscess extending from approximately C4 through T5. 3. Rim-enhancing fluid collection at the laminectomy bed abutting the posterior epidural space. Collection measures approximately 9 x 3 x 4 cm. 4. Anterior prevertebral phlegmonous changes centered at cervicothoracic junction. These results will be called to the ordering clinician or representative by the Radiologist Assistant, and communication documented in the PACS or zVision Dashboard. Electronically Signed   By: Davina Poke D.O.   On: 02/04/2020 09:42   DG Chest Port 1 View  Result Date: 02/04/2020 CLINICAL DATA:  Fever EXAM: PORTABLE CHEST 1 VIEW COMPARISON:  December 21, 2019 FINDINGS: The heart size and mediastinal contours are within normal limits. Both lungs are clear. The visualized skeletal structures are unremarkable. IMPRESSION: No active disease. Electronically Signed   By: Constance Holster M.D.   On: 02/04/2020 00:39   VAS [place] LOWER EXTREMITY VENOUS (DVT)  Result Date: 02/04/2020  Lower Venous DVTStudy Indications: Edema.  Risk Factors: None identified. Limitations: Poor ultrasound/tissue interface. Comparison Study: No prior studies. Performing Technologist: Oliver Hum RVT  Examination Guidelines: A complete evaluation includes B-mode imaging, spectral Doppler, color Doppler, and power Doppler as needed of all accessible portions of each vessel. Bilateral testing is considered an integral part of a complete examination. Limited examinations for reoccurring indications may be performed as noted. The reflux portion  of the exam is performed with the patient in reverse Trendelenburg.  +---------+---------------+---------+-----------+----------+--------------+ RIGHT    CompressibilityPhasicitySpontaneityPropertiesThrombus Aging +---------+---------------+---------+-----------+----------+--------------+ CFV      Full           Yes      Yes                                 +---------+---------------+---------+-----------+----------+--------------+ SFJ      Full                                                        +---------+---------------+---------+-----------+----------+--------------+ FV Prox  Full                                                        +---------+---------------+---------+-----------+----------+--------------+ FV Mid   Full                                                        +---------+---------------+---------+-----------+----------+--------------+  FV DistalFull                                                        +---------+---------------+---------+-----------+----------+--------------+ PFV      Full                                                        +---------+---------------+---------+-----------+----------+--------------+ POP      Full           Yes      Yes                                 +---------+---------------+---------+-----------+----------+--------------+ PTV      Full                                                        +---------+---------------+---------+-----------+----------+--------------+ PERO     Full                                                        +---------+---------------+---------+-----------+----------+--------------+   +---------+---------------+---------+-----------+----------+--------------+ LEFT     CompressibilityPhasicitySpontaneityPropertiesThrombus Aging +---------+---------------+---------+-----------+----------+--------------+ CFV      Full           Yes      Yes                                  +---------+---------------+---------+-----------+----------+--------------+ SFJ      Full                                                        +---------+---------------+---------+-----------+----------+--------------+ FV Prox  Full                                                        +---------+---------------+---------+-----------+----------+--------------+ FV Mid   Full                                                        +---------+---------------+---------+-----------+----------+--------------+ FV DistalFull                                                        +---------+---------------+---------+-----------+----------+--------------+  PFV      Full                                                        +---------+---------------+---------+-----------+----------+--------------+ POP      Full           Yes      Yes                                 +---------+---------------+---------+-----------+----------+--------------+ PTV      Full                                                        +---------+---------------+---------+-----------+----------+--------------+ PERO     Full                                                        +---------+---------------+---------+-----------+----------+--------------+     Summary: RIGHT: - There is no evidence of deep vein thrombosis in the lower extremity.  - No cystic structure found in the popliteal fossa.  LEFT: - There is no evidence of deep vein thrombosis in the lower extremity.  - No cystic structure found in the popliteal fossa.  *See table(s) above for measurements and observations. Electronically signed by Harold Barban MD on 02/04/2020 at 6:36:42 PM.    Final    [place] EKG SITE RITE  Result Date: 02/06/2020 If Site Rite image not attached, placement could not be confirmed due to current cardiac rhythm.  [place] EKG SITE RITE  Result Date: 02/06/2020 If Site Rite image not attached,  placement could not be confirmed due to current cardiac rhythm.     Discharge Exam: Vitals:   02/06/20 0916 02/06/20 1348  BP:  135/70  Pulse:  90  Resp:  18  Temp:  99.1 F (37.3 C)  SpO2: 94% 92%    GENERAL: No acute distress.  Appears well.  HEENT: MMM.  Vision and hearing grossly intact.  NECK: Supple.  No apparent JVD.  RESP:  No IWOB. Good air movement bilaterally. CVS:  RRR. Heart sounds normal.  ABD/GI/GU: Bowel sounds present. Soft. Non tender.  MSK/EXT:  Moves extremities. No apparent deformity or edema.  SKIN: no apparent skin lesion or wound NEURO: Awake, alert and oriented appropriately.  No apparent focal neuro deficit. PSYCH: Calm. Normal affect.    The results of significant diagnostics from this hospitalization (including imaging, microbiology, ancillary and laboratory) are listed below for reference.     Microbiology: Recent Results (from the past 240 hour(s))  Urine culture     Status: Abnormal   Collection Time: 02/04/20 12:13 AM   Specimen: Urine, Random  Result Value Ref Range Status   Specimen Description URINE, RANDOM  Final   Special Requests   Final    NONE Performed at [facility], [address], [place], [place] [address]    Culture MULTIPLE SPECIES PRESENT, SUGGEST RECOLLECTION (A)  Final  Report Status 02/05/2020 FINAL  Final  Blood culture (routine x 2)     Status: None (Preliminary result)   Collection Time: 02/04/20  1:28 AM   Specimen: BLOOD  Result Value Ref Range Status   Specimen Description BLOOD RIGHT ARM  Final   Special Requests   Final    BOTTLES DRAWN AEROBIC AND ANAEROBIC Blood Culture results may not be optimal due to an inadequate volume of blood received in culture bottles   Culture   Final    NO GROWTH 2 DAYS Performed at [facility], [place] [address], [place], [place] [address]    Report Status PENDING  Incomplete  Blood culture (routine x 2)     Status: None (Preliminary result)   Collection Time:  02/04/20  1:28 AM   Specimen: BLOOD  Result Value Ref Range Status   Specimen Description BLOOD RIGHT HAND  Final   Special Requests   Final    BOTTLES DRAWN AEROBIC AND ANAEROBIC Blood Culture adequate volume   Culture   Final    NO GROWTH 2 DAYS Performed at [facility], [place] [address] [place], [place] [address]    Report Status PENDING  Incomplete  Respiratory Panel by RT PCR (Flu A&B, Covid) - Urine, Clean Catch     Status: None   Collection Time: 02/04/20  1:36 AM   Specimen: Urine, Clean Catch  Result Value Ref Range Status   SARS Coronavirus 2 by RT PCR NEGATIVE NEGATIVE Final    Comment: (NOTE) SARS-CoV-2 target nucleic acids are NOT DETECTED. The SARS-CoV-2 RNA is generally detectable in upper respiratoy specimens during the acute phase of infection. The lowest concentration of SARS-CoV-2 viral copies this assay can detect is 131 copies/mL. A negative result does not preclude SARS-Cov-2 infection and should not be used as the sole basis for treatment or other patient management decisions. A negative result may occur with  improper specimen collection/handling, submission of specimen other than nasopharyngeal swab, presence of viral mutation(s) within the areas targeted by this assay, and inadequate number of viral copies (<131 copies/mL). A negative result must be combined with clinical observations, patient history, and epidemiological information. The expected result is Negative. Fact Sheet for Patients:  PinkCheek.be Fact Sheet for Healthcare Providers:  GravelBags.it This test is not yet ap proved or cleared by the [place] FDA and  has been authorized for detection and/or diagnosis of SARS-CoV-2 by FDA under an Emergency Use Authorization (EUA). This EUA will remain  in effect (meaning this test can be used) for the duration of the COVID-19 declaration under Section 564(b)(1) of the Act, 21  U.S.C. section 360bbb-3(b)(1), unless the authorization is terminated or revoked sooner.    Influenza A by PCR NEGATIVE NEGATIVE Final   Influenza B by PCR NEGATIVE NEGATIVE Final    Comment: (NOTE) The Xpert Xpress SARS-CoV-2/FLU/RSV assay is intended as an aid in  the diagnosis of influenza from Nasopharyngeal swab specimens and  should not be used as a sole basis for treatment. Nasal washings and  aspirates are unacceptable for Xpert Xpress SARS-CoV-2/FLU/RSV  testing. Fact Sheet for Patients: PinkCheek.be Fact Sheet for Healthcare Providers: GravelBags.it This test is not yet approved or cleared by the [place] FDA and  has been authorized for detection and/or diagnosis of SARS-CoV-2 by  FDA under an Emergency Use Authorization (EUA). This EUA will remain  in effect (meaning this test can be used) for the duration of the  Covid-19 declaration under  Section 564(b)(1) of the Act, 21  U.S.C. section 360bbb-3(b)(1), unless the authorization is  terminated or revoked. Performed at [facility], [place] [address], [place], [place] [address]   Body fluid culture     Status: None (Preliminary result)   Collection Time: 02/05/20  9:11 AM   Specimen: Body Fluid  Result Value Ref Range Status   Specimen Description FLUID THORACIC SUPERFICIAL FROM ASPIRATION  Final   Special Requests NONE  Final   Gram Stain   Final    RARE WBC PRESENT,BOTH PMN AND MONONUCLEAR NO ORGANISMS SEEN    Culture   Final    NO GROWTH < 24 HOURS Performed at [facility], [place] [address] [place], [place] [address]    Report Status PENDING  Incomplete     Labs: BNP (last 3 results) Recent Labs    12/18/19 0034 01/26/20 1420  BNP 74.0 20.3   Basic Metabolic Panel: Recent Labs  Lab 02/04/20 0128 02/05/20 0214 02/06/20 0521  NA 135 137 138  K 4.2 4.2 4.0  CL 95* 97* 99  CO2 '28 28 29  '$ GLUCOSE 142* 113* 166*  BUN '19 20 19   '$ CREATININE 1.38* 1.29* 1.30*  CALCIUM 8.3* 8.5* 8.3*  MG  --   --  1.9  PHOS  --   --  3.7   Liver Function Tests: Recent Labs  Lab 02/04/20 0128 02/05/20 0214 02/06/20 0521  AST 22 22  --   ALT 13 13  --   ALKPHOS 73 70  --   BILITOT 0.8 0.8  --   PROT 7.0 6.7  --   ALBUMIN 2.4* 2.3* 2.1*   No results for input(s): LIPASE, AMYLASE in the last 168 hours. No results for input(s): AMMONIA in the last 168 hours. CBC: Recent Labs  Lab 02/04/20 0128 02/05/20 0214 02/06/20 0521  WBC 13.1* 13.3* 13.4*  NEUTROABS 9.4* 9.2*  --   HGB 9.2* 9.9* 9.4*  HCT 28.9* 31.1* 29.1*  MCV 95.7 94.0 95.1  PLT 365 412* 422*   Cardiac Enzymes: No results for input(s): CKTOTAL, CKMB, CKMBINDEX, TROPONINI in the last 168 hours. BNP: Invalid input(s): POCBNP CBG: No results for input(s): GLUCAP in the last 168 hours. D-Dimer No results for input(s): DDIMER in the last 72 hours. Hgb A1c No results for input(s): HGBA1C in the last 72 hours. Lipid Profile No results for input(s): CHOL, HDL, LDLCALC, TRIG, CHOLHDL, LDLDIRECT in the last 72 hours. Thyroid function studies Recent Labs    02/04/20 0128  TSH 3.834   Anemia work up Recent Labs    02/05/20 0214  VITAMINB12 131*  FOLATE 34.8  FERRITIN 925*  TIBC 167*  IRON 13*  RETICCTPCT 1.7   Urinalysis    Component Value Date/Time   COLORURINE YELLOW 02/04/2020 0151   APPEARANCEUR CLOUDY (A) 02/04/2020 0151   LABSPEC 1.010 02/04/2020 0151   PHURINE 7.0 02/04/2020 0151   GLUCOSEU NEGATIVE 02/04/2020 0151   HGBUR NEGATIVE 02/04/2020 0151   BILIRUBINUR NEGATIVE 02/04/2020 0151   KETONESUR NEGATIVE 02/04/2020 0151   PROTEINUR 100 (A) 02/04/2020 0151   NITRITE NEGATIVE 02/04/2020 0151   LEUKOCYTESUR LARGE (A) 02/04/2020 0151   Sepsis Labs Invalid input(s): PROCALCITONIN,  WBC,  LACTICIDVEN   Time coordinating discharge: 45 minutes  SIGNED:  Mercy Riding, MD  [facility] 02/06/2020, 4:53 PM  If 7PM-7AM, please  contact night-coverage [email or web address] Password [email or web address]

## 2020-02-06 NOTE — Progress Notes (Addendum)
Subjective: No new complaints   Antibiotics:  Anti-infectives (From admission, onward)   Start     Dose/Rate Route Frequency Ordered Stop   02/05/20 1400  ceFAZolin (ANCEF) IVPB 2g/100 mL premix     2 g 200 mL/hr over 30 Minutes Intravenous Every 8 hours 02/05/20 0918        Medications: Scheduled Meds:  albuterol  2 puff Inhalation BID   Chlorhexidine Gluconate Cloth  6 each Topical Daily   feeding supplement (ENSURE ENLIVE)  237 mL Oral BID BM   folic acid  1 mg Oral Daily   gabapentin  300 mg Oral TID   heparin injection (subcutaneous)  5,000 Units Subcutaneous Q8H   lidocaine  5 mL Intradermal Once   multivitamin with minerals  1 tablet Oral Daily   oxyCODONE  10 mg Oral Q4H   pantoprazole  40 mg Oral Daily   sodium chloride flush  3 mL Intravenous Q12H   sodium chloride flush  3 mL Intravenous Q12H   vitamin B-12  1,000 mcg Oral Daily   Continuous Infusions:  sodium chloride      ceFAZolin (ANCEF) IV 2 g (02/06/20 0529)   PRN Meds:.sodium chloride, acetaminophen **OR** acetaminophen, albuterol, ondansetron **OR** ondansetron (ZOFRAN) IV, senna-docusate, sodium chloride flush    Objective: Weight change:   Intake/Output Summary (Last 24 hours) at 02/06/2020 1111 Last data filed at 02/06/2020 1025 Gross per 24 hour  Intake 443.42 ml  Output 1640 ml  Net -1196.58 ml   Blood pressure (!) 141/90, pulse 92, temperature 98.8 F (37.1 C), temperature source Oral, resp. rate 18, weight 83 kg, SpO2 94 %. Temp:  [97.7 F (36.5 C)-101.8 F (38.8 C)] 98.8 F (37.1 C) (02/19 0805) Pulse Rate:  [87-104] 92 (02/19 0805) Resp:  [17-18] 18 (02/19 0805) BP: (128-141)/(68-90) 141/90 (02/19 0805) SpO2:  [88 %-99 %] 94 % (02/19 0916)  Physical Exam: General: Alert and awake, oriented x3, not in any acute distress sitting in chair eating breakfast. HEENT: anicteric sclera, EOMI CVS regular rate, normal  Chest: , no wheezing, no respiratory  distress Abdomen: soft non-distended,  Skin: no rashes Neuro: nonfocal  CBC:    BMET Recent Labs    02/05/20 0214 02/06/20 0521  NA 137 138  K 4.2 4.0  CL 97* 99  CO2 28 29  GLUCOSE 113* 166*  BUN 20 19  CREATININE 1.29* 1.30*  CALCIUM 8.5* 8.3*     Liver Panel  Recent Labs    02/04/20 0128 02/04/20 0128 02/05/20 0214 02/06/20 0521  PROT 7.0  --  6.7  --   ALBUMIN 2.4*   < > 2.3* 2.1*  AST 22  --  22  --   ALT 13  --  13  --   ALKPHOS 73  --  70  --   BILITOT 0.8  --  0.8  --    < > = values in this interval not displayed.       Sedimentation Rate Recent Labs    02/04/20 0128  ESRSEDRATE 126*   C-Reactive Protein Recent Labs    02/04/20 0128  CRP 19.2*    Micro Results: Recent Results (from the past 720 hour(s))  Urine culture     Status: Abnormal   Collection Time: 02/04/20 12:13 AM   Specimen: Urine, Random  Result Value Ref Range Status   Specimen Description URINE, RANDOM  Final   Special Requests   Final  NONE Performed at [facility], [place] [address] [place], [place] [address]    Culture MULTIPLE SPECIES PRESENT, SUGGEST RECOLLECTION (A)  Final   Report Status 02/05/2020 FINAL  Final  Blood culture (routine x 2)     Status: None (Preliminary result)   Collection Time: 02/04/20  1:28 AM   Specimen: BLOOD  Result Value Ref Range Status   Specimen Description BLOOD RIGHT ARM  Final   Special Requests   Final    BOTTLES DRAWN AEROBIC AND ANAEROBIC Blood Culture results may not be optimal due to an inadequate volume of blood received in culture bottles   Culture   Final    NO GROWTH 2 DAYS Performed at [facility], [place] [address] [place], [place] [address]    Report Status PENDING  Incomplete  Blood culture (routine x 2)     Status: None (Preliminary result)   Collection Time: 02/04/20  1:28 AM   Specimen: BLOOD  Result Value Ref Range Status   Specimen Description BLOOD RIGHT HAND  Final   Special Requests    Final    BOTTLES DRAWN AEROBIC AND ANAEROBIC Blood Culture adequate volume   Culture   Final    NO GROWTH 2 DAYS Performed at [facility], [place] [address] [place],  [address]    Report Status PENDING  Incomplete  Respiratory Panel by RT PCR (Flu A&B, Covid) - Urine, Clean Catch     Status: None   Collection Time: 02/04/20  1:36 AM   Specimen: Urine, Clean Catch  Result Value Ref Range Status   SARS Coronavirus 2 by RT PCR NEGATIVE NEGATIVE Final    Comment: (NOTE) SARS-CoV-2 target nucleic acids are NOT DETECTED. The SARS-CoV-2 RNA is generally detectable in upper respiratoy specimens during the acute phase of infection. The lowest concentration of SARS-CoV-2 viral copies this assay can detect is 131 copies/mL. A negative result does not preclude SARS-Cov-2 infection and should not be used as the sole basis for treatment or other patient management decisions. A negative result may occur with  improper specimen collection/handling, submission of specimen other than nasopharyngeal swab, presence of viral mutation(s) within the areas targeted by this assay, and inadequate number of viral copies (<131 copies/mL). A negative result must be combined with clinical observations, patient history, and epidemiological information. The expected result is Negative. Fact Sheet for Patients:  PinkCheek.be Fact Sheet for Healthcare Providers:  GravelBags.it This test is not yet ap proved or cleared by the [place] FDA and  has been authorized for detection and/or diagnosis of SARS-CoV-2 by FDA under an Emergency Use Authorization (EUA). This EUA will remain  in effect (meaning this test can be used) for the duration of the COVID-19 declaration under Section 564(b)(1) of the Act, 21 U.S.C. section 360bbb-3(b)(1), unless the authorization is terminated or revoked sooner.    Influenza A by PCR NEGATIVE NEGATIVE Final    Influenza B by PCR NEGATIVE NEGATIVE Final    Comment: (NOTE) The Xpert Xpress SARS-CoV-2/FLU/RSV assay is intended as an aid in  the diagnosis of influenza from Nasopharyngeal swab specimens and  should not be used as a sole basis for treatment. Nasal washings and  aspirates are unacceptable for Xpert Xpress SARS-CoV-2/FLU/RSV  testing. Fact Sheet for Patients: PinkCheek.be Fact Sheet for Healthcare Providers: GravelBags.it This test is not yet approved or cleared by the [place] FDA and  has been authorized for detection and/or diagnosis of SARS-CoV-2 by  FDA  under an Emergency Use Authorization (EUA). This EUA will remain  in effect (meaning this test can be used) for the duration of the  Covid-19 declaration under Section 564(b)(1) of the Act, 21  U.S.C. section 360bbb-3(b)(1), unless the authorization is  terminated or revoked. Performed at [facility], [place] [address] [place], [place] [address]   Body fluid culture     Status: None (Preliminary result)   Collection Time: 02/05/20  9:11 AM   Specimen: Body Fluid  Result Value Ref Range Status   Specimen Description FLUID THORACIC SUPERFICIAL FROM ASPIRATION  Final   Special Requests NONE  Final   Gram Stain   Final    RARE WBC PRESENT,BOTH PMN AND MONONUCLEAR NO ORGANISMS SEEN    Culture   Final    NO GROWTH < 24 HOURS Performed at [facility], [place] [address] [place], [place] [address]    Report Status PENDING  Incomplete    Studies/Results: VAS [place] LOWER EXTREMITY VENOUS (DVT)  Result Date: 02/04/2020  Lower Venous DVTStudy Indications: Edema.  Risk Factors: None identified. Limitations: Poor ultrasound/tissue interface. Comparison Study: No prior studies. Performing Technologist: Oliver Hum RVT  Examination Guidelines: A complete evaluation includes B-mode imaging, spectral Doppler, color Doppler, and power Doppler as needed of all  accessible portions of each vessel. Bilateral testing is considered an integral part of a complete examination. Limited examinations for reoccurring indications may be performed as noted. The reflux portion of the exam is performed with the patient in reverse Trendelenburg.  +---------+---------------+---------+-----------+----------+--------------+  RIGHT     Compressibility Phasicity Spontaneity Properties Thrombus Aging  +---------+---------------+---------+-----------+----------+--------------+  CFV       Full            Yes       Yes                                    +---------+---------------+---------+-----------+----------+--------------+  SFJ       Full                                                             +---------+---------------+---------+-----------+----------+--------------+  FV Prox   Full                                                             +---------+---------------+---------+-----------+----------+--------------+  FV Mid    Full                                                             +---------+---------------+---------+-----------+----------+--------------+  FV Distal Full                                                             +---------+---------------+---------+-----------+----------+--------------+  PFV       Full                                                             +---------+---------------+---------+-----------+----------+--------------+  POP       Full            Yes       Yes                                    +---------+---------------+---------+-----------+----------+--------------+  PTV       Full                                                             +---------+---------------+---------+-----------+----------+--------------+  PERO      Full                                                             +---------+---------------+---------+-----------+----------+--------------+   +---------+---------------+---------+-----------+----------+--------------+   LEFT      Compressibility Phasicity Spontaneity Properties Thrombus Aging  +---------+---------------+---------+-----------+----------+--------------+  CFV       Full            Yes       Yes                                    +---------+---------------+---------+-----------+----------+--------------+  SFJ       Full                                                             +---------+---------------+---------+-----------+----------+--------------+  FV Prox   Full                                                             +---------+---------------+---------+-----------+----------+--------------+  FV Mid    Full                                                             +---------+---------------+---------+-----------+----------+--------------+  FV Distal Full                                                             +---------+---------------+---------+-----------+----------+--------------+  PFV       Full                                                             +---------+---------------+---------+-----------+----------+--------------+  POP       Full            Yes       Yes                                    +---------+---------------+---------+-----------+----------+--------------+  PTV       Full                                                             +---------+---------------+---------+-----------+----------+--------------+  PERO      Full                                                             +---------+---------------+---------+-----------+----------+--------------+     Summary: RIGHT: - There is no evidence of deep vein thrombosis in the lower extremity.  - No cystic structure found in the popliteal fossa.  LEFT: - There is no evidence of deep vein thrombosis in the lower extremity.  - No cystic structure found in the popliteal fossa.  *See table(s) above for measurements and observations. Electronically signed by Harold Barban MD on 02/04/2020 at 6:36:42 PM.    Final    [place] EKG SITE  RITE  Result Date: 02/06/2020 If Site Rite image not attached, placement could not be confirmed due to current cardiac rhythm.     Assessment/Plan:   Cultures from fluid collection unrevealing so far.  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 male with  74 y.o. man w/ prior epidural abscess, now with recurrent fevers. MRI C/T-spine personally reviewed, they show some progression of destruction in the 7-1 disc space from presumed discitis with some involvement of the T2/3 bodies now, posterior enhancing epidural collection that extends into the laminectomy bed.  Neurosurgery felt no reason for NS intervention  IR did not find target  Dr. Zada Finders from NS has aspirated superficial abscess  Cultures are unrevealing.  We have restarted him on cefazolin  Will plan on 8 weeks IV followed by months of PO abx and repeat MRI along the way   JAEKOB RITZEL has an appointment on 03/08/2020 at 1015 AM with Dr. Tommy Medal  The [facility] is located in the [facility] at  [place] in [place].  [address], which is located  to the left of the elevators.  Phone: [phone]  Fax: [phone]  [email or web address]  He should arrive at least 15 minutes prior to his appointment.  I will followup his culture data but will otherwise only follow peripherally and sign off  I am comfortable with him DC to home with IV antibiotics which can be adjusted with [facility] provided we have contact number for them             LOS: 2 days   Alcide Evener 02/06/2020, 11:11 AM

## 2020-02-06 NOTE — TOC Transition Note (Addendum)
Transition of Care [facility]) - CM/SW Discharge Note   Patient Details  Name: Kent Flowers MRN: [id number] Date of Birth: 14-Aug-1946  [facility] of [facility] CM/SW Contact:  Alexander Mt, LCSW Phone Number: 02/06/2020, 1:20 PM   Clinical Narrative:    CSW received call/message from Danny Lawless with [facility], he confirmed that in fact pt is active with [facility] and they can accept him back at discharge. D/c today and pt will meet with Carolynn Sayers, through [facility]. Met with pt at bedside- he now states his wife and daughter will assist with home infusion, he is awaiting his picc placement. Orders for [facility] completed. Pt family will pick him up for discharge.   Final next level of care: Home w Home Health Services Barriers to Discharge: Other (comment)(iv abx coordination)   Patient Goals and CMS Choice Patient states their goals for this hospitalization and ongoing recovery are:: to go home and have assistance with home abx CMS Medicare.gov Compare Post Acute Care list provided to:: Patient(already active with [facility], keeping their services) Choice offered to / list presented to : Patient  Discharge Placement         Patient chooses bed at: Other - please specify in the comment section below:(home) Name of family member notified: pt wife Patient and family notified of of transfer: 02/06/20  Discharge Plan and Services In-house Referral: Clinical Social Work Discharge Planning Services: CM Consult Post Acute Care Choice: Durable Medical Equipment, Home Health, Resumption of Svcs/PTA Provider          HH Arranged: PT, OT, RN [place] Agency: [facility], [place] Date [place]: 02/06/20 Time HH Agency Contacted: 22 Representative spoke with at [place]: [facility], Danny Lawless   Readmission Risk Interventions Readmission Risk Prevention Plan 02/05/2020  Transportation Screening Complete  PCP or Specialist Appt within 3-5 Days Not Complete   Not Complete comments pending medical stability  HRI or [place] Complete  Social Work Consult for [place] Planning/Counseling Complete  Palliative Care Screening Not Applicable  Medication Review Press photographer) Referral to Pharmacy  Some recent data might be hidden

## 2020-02-06 NOTE — Plan of Care (Signed)

## 2020-02-06 NOTE — Evaluation (Addendum)
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 Pt PMH includes  COPD, right apical lung mass scheduled for PET scan in 1 week, recent leg swelling started on Lasix, and recent hospital admission with thoracic epidural abscess and MSSA bacteremia.   Clinical Impression   This 74 yo male with PLOF of being independent to Mod I with all basic ADLs presents to acute OT at a min guard A level when he is up on his feet ambulating for basic ADLs and for sit<>stand due to stating he has a fear of falling forward with transitions. He is scheduled to D/C once has IV iron (running), PICC line placed (done), and HHRN (waiting) talk to him about IV meds at home. Defer remainder of OT to [facility], we will sign off.    Follow Up Recommendations  Home health OT;Supervision/Assistance - 24 hour    Equipment Recommendations  None recommended by OT       Precautions / Restrictions Precautions Precautions: Fall Restrictions Weight Bearing Restrictions: No      Mobility Bed Mobility Overal bed mobility: Modified Independent             General bed mobility comments: increased time, but no assist needed  Transfers Overall transfer level: Needs assistance Equipment used: Rolling walker (2 wheeled) Transfers: Sit to/from Stand Sit to Stand: Min guard         General transfer comment: Pt reports he has a fear of falling forward so he takes extra time to get up and down, a little off balance from toilet    Balance Overall balance assessment: Needs assistance Sitting-balance support: No upper extremity supported Sitting balance-Leahy Scale: Good Sitting balance - Comments: independent   Standing balance support: No upper  extremity supported;During functional activity Standing balance-Leahy Scale: Fair Standing balance comment: standing at sink towash hands                           ADL either performed or assessed with clinical judgement   ADL Overall ADL's : Needs assistance/impaired Eating/Feeding: Independent;Sitting   Grooming: Min guard;Standing;Wash/dry hands   Upper Body Bathing: Set up;Sitting   Lower Body Bathing: Min guard;Sit to/from stand   Upper Body Dressing : Set up;Sitting   Lower Body Dressing: Min guard;Sit to/from stand   Toilet Transfer: Min guard;Ambulation;RW;Comfort height toilet;Grab bars   Toileting- Clothing Manipulation and Hygiene: Min guard;Sit to/from stand          Pt reports he usually walks short distances at home without RW, so we tried from recliner to bed (5 feet away) and he was definitely off balance--I recommended to him that he continue to use his RW even for short distances for now until [facility] could work with him more.     Vision Patient Visual Report: No change from baseline              Pertinent Vitals/Pain Pain Assessment: 0-10 Pain Score: 6  Pain Location: mid/upper back Pain Descriptors / Indicators: Sore Pain Intervention(s): Limited activity within patient's tolerance;Monitored during session;Repositioned;Patient requesting pain meds-RN notified;RN gave pain meds during session     Hand Dominance Right   Extremity/Trunk Assessment Upper Extremity Assessment Upper Extremity Assessment: Overall WFL for  tasks assessed   Lower Extremity Assessment Lower Extremity Assessment: Overall WFL for tasks assessed   Cervical / Trunk Assessment Cervical / Trunk Assessment: Kyphotic   Communication Communication Communication: HOH   Cognition Arousal/Alertness: Awake/alert Behavior During Therapy: WFL for tasks assessed/performed Overall Cognitive Status: Within Functional Limits for tasks assessed                                               Home Living Family/patient expects to be discharged to:: Private residence Living Arrangements: Spouse/significant other(wife on permanent disability) Available Help at Discharge: Available PRN/intermittently;Family Type of Home: House Home Access: Stairs to enter [employer] of Steps: 4-5 front. 5-6 back, both with rails on both sides but wide Entrance Stairs-Rails: Can reach both Home Layout: One level     Bathroom Shower/Tub: Walk-in Hydrologist: Handicapped height Bathroom Accessibility: Yes How Accessible: Accessible via wheelchair;Accessible via walker Home Equipment: [place] - 4 wheels;Walker - 2 wheels;Cane - single point;Shower seat - built in;Grab bars - tub/shower   Additional Comments: dog and 3 cats - all inside animals       Prior Functioning/Environment Level of Independence: Independent with assistive device(s);Needs assistance  Gait / Transfers Assistance Needed: ambulating in home without walker sometimes in kitchen ADL's / Homemaking Assistance Needed: shared between pt and his wife   Comments: wife and daughter have been helping with IV care, per pt it has to be changed 2x/day. wife is currently on permanent disability        OT Problem List: Impaired balance (sitting and/or standing);Pain         OT Goals(Current goals can be found in the care plan section) Acute Rehab OT Goals Patient Stated Goal: to go home today hopefully OT Goal Formulation: With patient  OT Frequency:                AM-PAC OT "6 Clicks" Daily Activity     Outcome Measure Help from another person eating meals?: None Help from another person taking care of personal grooming?: A Little Help from another person toileting, which includes using toliet, bedpan, or urinal?: A Little Help from another person bathing (including washing, rinsing, drying)?: A Little Help from another person to put on and taking off  regular upper body clothing?: A Little Help from another person to put on and taking off regular lower body clothing?: A Little 6 Click Score: 19   End of Session Equipment Utilized During Treatment: Gait belt;Rolling walker Nurse Communication: Patient requests pain meds  Activity Tolerance: Patient tolerated treatment well Patient left: in bed;with call bell/phone within reach  OT Visit Diagnosis: Unsteadiness on feet (R26.81);Other abnormalities of gait and mobility (R26.89);Pain Pain - part of body: (back)                Time: 1341-1418 OT Time Calculation (min): 37 min Charges:  OT General Charges $OT Visit: 1 Visit OT Evaluation $OT Eval Moderate Complexity: 1 Mod OT Treatments $Self Care/Home Management : 8-22 mins  [facility]  OTR/L Acute [employer] Pager [phone] Office [phone]     02/06/2020, 3:26 PM

## 2020-02-06 NOTE — Evaluation (Signed)
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 Pt PMH includes  COPD, right apical lung mass scheduled for PET scan in 1 week, recent leg swelling started on Lasix, and recent hospital admission with thoracic epidural abscess and MSSA bacteremia.  Clinical Impression  Pt in bed upon arrival of PT, agreeable to PT evaluation at this time. The pt presents with limitations in functional mobility, dynamic stability, and endurance compared to his prior level of function and independence due to above dx. The pt was able to demonstrate good bed mobility, and transfers, but remains limited to short ambulation today. The pt reports he was already receiving HHPT services at home, and I recommend resuming these upon d/c to further progress safety and stability with mobility.      Follow Up Recommendations Home health PT;Supervision/Assistance - 24 hour(pt reports he already has HHPT set up, recommend resume HHPT services)    Equipment Recommendations  None recommended by PT(pt has needed equipment)    Recommendations for Other Services       Precautions / Restrictions Precautions Precautions: Fall Restrictions Weight Bearing Restrictions: No      Mobility  Bed Mobility Overal bed mobility: Modified Independent             General bed mobility comments: increased time, but no assist needed  Transfers Overall transfer level: Modified independent Equipment used: Rolling walker (2 wheeled)             General transfer comment: extra time, use of RW, no evidence of instability  Ambulation/Gait Ambulation/Gait assistance: Supervision Gait Distance (Feet): 50 Feet Assistive device: Rolling walker (2 wheeled) Gait  Pattern/deviations: Step-through pattern;Narrow base of support   Gait velocity interpretation: 1.31 - 2.62 ft/sec, indicative of limited community ambulator General Gait Details: Pt with generally functional gait, limited by need to use the restroom.  Stairs            Wheelchair Mobility    Modified Rankin (Stroke Patients Only)       Balance Overall balance assessment: Needs assistance Sitting-balance support: No upper extremity supported Sitting balance-Leahy Scale: Good Sitting balance - Comments: independent     Standing balance-Leahy Scale: Fair Standing balance comment: static stand without RW, benefits from RW for ambulation, reports he walks without RW at home                             Pertinent Vitals/Pain Pain Assessment: 0-10 Pain Score: 5  Pain Location: mid/upper back Pain Descriptors / Indicators: Sore Pain Intervention(s): Limited activity within patient's tolerance;Monitored during session;Repositioned    Home Living Family/patient expects to be discharged to:: Private residence Living Arrangements: Spouse/significant other(wife on permanent disability) Available Help at Discharge: Available PRN/intermittently;Family Type of Home: House Home Access: Stairs to enter Entrance Stairs-Rails: Can reach both Entrance Stairs-Number of Steps: 4-5 front. 5-6 back, both with rails on both sides but wide Home Layout: One level Home Equipment: Walker - 4 wheels;Walker - 2 wheels;Cane - single point;Shower seat - built in;Grab bars - tub/shower Additional Comments: dog and 3 cats - all inside animals     Prior Function Level of Independence: Independent with assistive device(s);Needs assistance   Gait / Transfers Assistance Needed: ambulating in  home without walker sometimes in kitchen  ADL's / Homemaking Assistance Needed: shared between pt and his wife  Comments: wife and daughter have been helping with IV care, per pt it has to be changed  2x/day. wife is currently on permanent disability     Hand Dominance   Dominant Hand: Right    Extremity/Trunk Assessment   Upper Extremity Assessment Upper Extremity Assessment: Overall WFL for tasks assessed    Lower Extremity Assessment Lower Extremity Assessment: Overall WFL for tasks assessed    Cervical / Trunk Assessment Cervical / Trunk Assessment: Kyphotic  Communication   Communication: HOH  Cognition Arousal/Alertness: Awake/alert Behavior During Therapy: WFL for tasks assessed/performed Overall Cognitive Status: Within Functional Limits for tasks assessed                                 General Comments: slowed speech, tangental at times, but generally [facility]      General Comments      Exercises     Assessment/Plan    PT Assessment Patient needs continued PT services  PT Problem List Decreased strength;Decreased mobility;Decreased range of motion;Decreased coordination;Decreased activity tolerance;Decreased balance       PT Treatment Interventions DME instruction;Therapeutic exercise;Gait training;Stair training;Functional mobility training;Therapeutic activities;Patient/family education;Balance training    PT Goals (Current goals can be found in the Care Plan section)  Acute Rehab PT Goals Patient Stated Goal: return home PT Goal Formulation: With patient Time For Goal Achievement: 02/20/20 Potential to Achieve Goals: Good    Frequency Min 3X/week   Barriers to discharge        Co-evaluation               AM-PAC PT "6 Clicks" Mobility  Outcome Measure Help needed turning from your back to your side while in a flat bed without using bedrails?: None Help needed moving from lying on your back to sitting on the side of a flat bed without using bedrails?: A Little Help needed moving to and from a bed to a chair (including a wheelchair)?: A Little Help needed standing up from a chair using your arms (e.g., wheelchair or bedside  chair)?: None Help needed to walk in hospital room?: A Little Help needed climbing 3-5 steps with a railing? : A Little 6 Click Score: 20    End of Session Equipment Utilized During Treatment: Gait belt Activity Tolerance: Patient tolerated treatment well Patient left: with call bell/phone within reach(in the bathroom on commode, RN aware) Nurse Communication: Mobility status PT Visit Diagnosis: Difficulty in walking, not elsewhere classified (R26.2);Pain Pain - Right/Left: (central) Pain - part of body: (back)    Time: 1115-1140 PT Time Calculation (min) (ACUTE ONLY): 25 min   Charges:   PT Evaluation $PT Eval Low Complexity: 1 Low PT Treatments $Gait Training: 8-22 mins        Karma Ganja, PT, DPT   Acute Rehabilitation Department Pager #: [phone]  Otho Bellows 02/06/2020, 1:11 PM

## 2020-02-06 NOTE — Progress Notes (Signed)
Peripherally Inserted Central Catheter/Midline Placement  The IV Nurse has discussed with the patient and/or persons authorized to consent for the patient, the purpose of this procedure and the potential benefits and risks involved with this procedure.  The benefits include less needle sticks, lab draws from the catheter, and the patient may be discharged home with the catheter. Risks include, but not limited to, infection, bleeding, blood clot (thrombus formation), and puncture of an artery; nerve damage and irregular heartbeat and possibility to perform a PICC exchange if needed/ordered by physician.  Alternatives to this procedure were also discussed.  Bard Power PICC patient education guide, fact sheet on infection prevention and patient information card has been provided to patient /or left at bedside.    PICC/Midline Placement Documentation  PICC Single Lumen [id number] PICC Right Basilic 38 cm 0 cm (Active)     PICC Single Lumen [id number] PICC Right Basilic 38 cm 0 cm (Active)  Indication for Insertion or Continuance of Line Home intravenous therapies (PICC only) 02/06/20 1514  Exposed Catheter (cm) 0 cm 02/06/20 1514  Site Assessment Clean;Dry;Intact 02/06/20 1514  Line Status Flushed;Saline locked;Blood return noted 02/06/20 1514  Dressing Type Transparent;Securing device 02/06/20 1514  Dressing Status Clean;Dry;Intact;Antimicrobial disc in place 02/06/20 1514  Dressing Intervention New dressing 02/06/20 1514  Dressing Change Due 02/13/20 02/06/20 1514    Verbal consent given due to patient unable to hold the pen, consent in chart.   [name] 02/06/2020, 3:15 PM

## 2020-02-07 DIAGNOSIS — R7881 Bacteremia: Secondary | ICD-10-CM | POA: Diagnosis not present

## 2020-02-07 DIAGNOSIS — A4901 Methicillin susceptible Staphylococcus aureus infection, unspecified site: Secondary | ICD-10-CM | POA: Diagnosis not present

## 2020-02-07 DIAGNOSIS — G062 Extradural and subdural abscess, unspecified: Secondary | ICD-10-CM | POA: Diagnosis not present

## 2020-02-07 DIAGNOSIS — B9561 Methicillin susceptible Staphylococcus aureus infection as the cause of diseases classified elsewhere: Secondary | ICD-10-CM | POA: Diagnosis not present

## 2020-02-07 LAB — RENAL FUNCTION PANEL
Albumin: 2.1 g/dL — ABNORMAL LOW (ref 3.5–5.0)
Anion gap: 11 (ref 5–15)
BUN: 16 mg/dL (ref 8–23)
CO2: 31 mmol/L (ref 22–32)
Calcium: 8.2 mg/dL — ABNORMAL LOW (ref 8.9–10.3)
Chloride: 97 mmol/L — ABNORMAL LOW (ref 98–111)
Creatinine, Ser: 1.09 mg/dL (ref 0.61–1.24)
GFR calc Af Amer: >60 mL/min (ref >60)
GFR calc non Af Amer: >60 mL/min (ref >60)
Glucose, Bld: 112 mg/dL — ABNORMAL HIGH (ref 70–99)
Phosphorus: 3.9 mg/dL (ref 2.5–4.6)
Potassium: 3.9 mmol/L (ref 3.5–5.1)
Sodium: 139 mmol/L (ref 135–145)

## 2020-02-07 LAB — CBC
HCT: 26.8 % — ABNORMAL LOW (ref 39.0–52.0)
Hemoglobin: 8.5 g/dL — ABNORMAL LOW (ref 13.0–17.0)
MCH: 30.2 pg (ref 26.0–34.0)
MCHC: 31.7 g/dL (ref 30.0–36.0)
MCV: 95.4 fL (ref 80.0–100.0)
Platelets: 405 10*3/uL — ABNORMAL HIGH (ref 150–400)
RBC: 2.81 MIL/uL — ABNORMAL LOW (ref 4.22–5.81)
RDW: 13.1 % (ref 11.5–15.5)
WBC: 13.5 10*3/uL — ABNORMAL HIGH (ref 4.0–10.5)
nRBC: 0 % (ref 0.0–0.2)

## 2020-02-07 LAB — MAGNESIUM: Magnesium: 1.8 mg/dL (ref 1.7–2.4)

## 2020-02-07 MED ORDER — HEPARIN SOD (PORK) LOCK FLUSH 100 UNIT/ML IV SOLN
250.0000 [IU] | INTRAVENOUS | Status: AC | PRN
  Administered 2020-02-07: 15:00:00 250 [IU]
  Filled 2020-02-07: qty 2.5

## 2020-02-07 NOTE — TOC Transition Note (Signed)
Transition of Care [facility]) - CM/SW Discharge Note   Patient Details  Name: Kent Flowers MRN: [id number] Date of Birth: [birth year]  Transition of Care [facility]) CM/SW Contact:  Alexander Mt, LCSW Phone Number: 02/07/2020, 9:40 AM   Clinical Narrative:    Plan for pt to discharge on continuous infusion for pt family ease. Plan for pt to be delivered this afternoon between 4-6pm for wife to connect. Pt will receive evening doses per Pam with [facility]. Pt family ready for discharge today. All orders in for [facility] to continue with [facility].    Final next level of care: Home w Home Health Services Barriers to Discharge: Other (comment)(iv abx coordination)   Patient Goals and CMS Choice Patient states their goals for this hospitalization and ongoing recovery are:: to go home and have assistance with home abx CMS Medicare.gov Compare Post Acute Care list provided to:: Patient(already active with [facility], keeping their services) Choice offered to / list presented to : Patient  Discharge Placement     Patient chooses bed at: Other - please specify in the comment section below:(home)   Name of family member notified: pt wife Patient and family notified of of transfer: 02/06/20  Discharge Plan and Services In-house Referral: Clinical Social Work Discharge Planning Services: CM Consult Post Acute Care Choice: Durable Medical Equipment, Home Health, Resumption of Svcs/PTA Provider           HH Arranged: PT, OT, RN [place] Agency: [facility], [place] Date [place]: 02/06/20 Time HH Agency Contacted: 52 Representative spoke with at [place]: Carolynn Sayers, Danny Lawless  Readmission Risk Interventions Readmission Risk Prevention Plan 02/05/2020  Transportation Screening Complete  PCP or Specialist Appt within 3-5 Days Not Complete  Not Complete comments pending medical stability  HRI or [place] Complete  Social Work Consult for [place]  Planning/Counseling Complete  Palliative Care Screening Not Applicable  Medication Review Press photographer) Referral to Pharmacy  Some recent data might be hidden

## 2020-02-07 NOTE — Progress Notes (Signed)
   Providing Compassionate, Quality Care - Together   Subjective: Patient reports he is ready to discharge home. He tells me he was kept overnight to monitor him following administration of his new antibiotic, but should go home later today.  Objective: Vital signs in last 24 hours: Temp:  [98.6 F (37 C)-99.1 F (37.3 C)] 99.1 F (37.3 C) (02/20 0831) Pulse Rate:  [87-93] 87 (02/20 0831) Resp:  [16-18] 18 (02/20 0831) BP: (122-135)/(58-71) 122/58 (02/20 0831) SpO2:  [92 %-94 %] 92 % (02/20 0831) Weight:  [77.2 kg] 77.2 kg (02/20 0500)  Intake/Output from previous day: 02/19 0701 - 02/20 0700 In: 1239.7 [P.O.:720; I.V.:3; IV Piggyback:516.7] Out: 1950 [Urine:1950] Intake/Output this shift: Total I/O In: 240 [P.O.:240] Out: 600 [Urine:600]  Alert and oriented x 4 PERRLA RUE still with weakness and coordination issues Aspiration site is clean, dry, and intact   Lab Results: Recent Labs    02/06/20 0521 02/07/20 0320  WBC 13.4* 13.5*  HGB 9.4* 8.5*  HCT 29.1* 26.8*  PLT 422* 405*   BMET Recent Labs    02/06/20 0521 02/07/20 0320  NA 138 139  K 4.0 3.9  CL 99 97*  CO2 29 31  GLUCOSE 166* 112*  BUN 19 16  CREATININE 1.30* 1.09  CALCIUM 8.3* 8.2*    Studies/Results: [place] EKG SITE RITE  Result Date: 02/06/2020 If Site Rite image not attached, placement could not be confirmed due to current cardiac rhythm.  [place] EKG SITE RITE  Result Date: 02/06/2020 If Site Rite image not attached, placement could not be confirmed due to current cardiac rhythm.   Assessment/Plan: 74 y.o. man w/ prior epidural abscess, admitted for recurrent fevers on 02/03/2020. MRI of the cervical and thoracic spine showed some progression of the destruction in the C7-T1 disc space from presumed discitis with some involvement of the T2-3 bodies. There was a posterior enhancing epidural collection that extended into the laminectomy bed. Fluid collection was aspirated by Dr. Zada Finders on  02/05/2020. Culture showed no growth.   LOS: 3 days    -Plan is for discharge later today with home health for continuous infusion therapies. -Patient to follow up with Dr. Zada Finders as an outpatient.  Viona Gilmore, DNP, AGNP-C Nurse Practitioner  [facility] [place] [address], [address], [place], [place] [address] P: [phone]    F: [phone]  02/07/2020, 11:11 AM

## 2020-02-07 NOTE — Progress Notes (Signed)
Pt is A&O x4. PICC was flushed and capped by IV team, dressing dry and intact. Home health IV nurse will be at the pt's house between 5pm-6pm. Pt wants to get discharged at 1530. Discharge instructions given to pt. Discharged to home picked up by his wife.

## 2020-02-07 NOTE — Discharge Summary (Signed)
Physician Discharge Summary  Kent Flowers TMA:[id number] DOB: 1946-09-08 DOA: 02/03/2020  PCP: Carylon Perches, NP  Admit date: 02/03/2020 Discharge date: 02/07/2020  Admitted From: Home Disposition: Home  Recommendations for Outpatient Follow-up:  1. Follow ups as below. 2. Please obtain CBC/BMP/Mag at follow up 3. Please follow up on the following pending results: None  Home Health: PT/OT Equipment/Devices: None  Discharge Condition: Stable CODE STATUS: DNR/DNI Follow-up Information    [name], MD. Schedule an appointment as soon as possible for a visit in 2 week(s).   Specialty: Neurosurgery Contact information: [place] [address] [phone]           Hospital Course: 74 year old male with history of COPD, right apical lung mass, chronic pain on chronic opiate, recent hospitalization for MSSA bacteremia and epidural abscess who completed antibiotic course on 01/28/2020 returning today with fever and general malaise.  In ED, febrile to 38.6 C.  Slightly tachycardic.  Soft blood pressures but within normal.  Creatinine 1.38 (1.0 about a week ago).  COVID-19 PCR negative.  CRP 19.2.  LA 0.7.  WBC 13K.  Hgb 9.2.  Blood and urine cultures collected.  ID consulted.  MRI thoracic spine ordered and hospitalist service was called for admission.  Patient is admitted with working diagnosis of sepsis likely from cervicothoracic infection.  ID recommended holding off antibiotics pending tissue cultures.   MRI cervical and thoracic spine revealed C7-T1 discitis/osteomyelitis, T2 and T3 vertebral body osteomyelitis, enhancing epidural phlegmon/abscess extending from approximately T4-T5, rim-enhancing fluid collection at the laminectomy bed abutting the posterior epidural space measuring 9 x 3 x 4 cm and anterior prevertebral phlegmonous changes centered at the cervicothoracic junction.  Neurosurgery, Dr. Zada Finders consulted and did bedside needle  aspiration of fluid collection on 02/05/2020.  Fuid "most consistent with postop seroma". Fluid cytology neutrophilic.  Cultures negative so far.  Per neurosurgery, no indication for open or operative surgical intervention.  Patient was started on IV Ancef by infectious disease and cleared for discharge on IV Ancef for 8 weeks followed by oral antibiotic for a month this. Antibiotic changed to IV Nafcillin 2 g daily by ID after discussion with home infusion nurse.  ID recommended repeat MRI along the way.  Outpatient follow-up scheduled for 03/08/2020 at 10:15 AM with Dr. Tommy Medal.  Patient had PICC line placed and discharged with home health PT/OT/RN.  Patient's daughter updated on the day of discharge.  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  CRP and ESR elevated.  Blood cultures negative.  No new focal neurological deficit. -Bedside needle aspiration of the fluid/seroma by neurosurgery on 2/18.  Fluid is neutrophilic.  Culture negative.  Per neurosurgery, no indication for surgical intervention. -IV Ancef 2/18-2/19 -ID recommendeds:  -IV nafcillin 2 g daily 02/07/2020-04/01/2020 followed by oral antibiotic for months.  -Weekly BMP and CBC with differential, and biweekly ESR and CRP along the way  -Repeat MRI along the way.  -Follow-up with ID point 03/08/2020 at 10:15 AM -Pain control with home oxycodone -Added bowel regimen  Bilateral extremity edema: Basically normal echo on 12/19/2019.  Bilateral LE Doppler negative for DVT.  Albumin 2.4.  TSH normal.  Hgb 9.2.  Urine protein 100.  Patient has concerning right apical lung mass.  Edema improved with TED hose and lower extremity elevation. -Encourage TED hose and lower extremity elevation -Discontinue diuretics in the setting of AKI-diuretics usually not effective in the situation.  Chronic COPD: Stable -Discharged on home breathing  treatments.  Right apical lung mass: Scheduled for PET scan on 02/10/2019. -Outpatient follow-up  AKI: Baseline Cr 0.6-0.8> 1.03 (2/8)> 1.38 (admit)> 1. 09.  BUN within normal.  On low-dose enalapril and Demadex.  Denies NSAID use. -Discontinued home Demadex and enalapril -Recheck BMP in a week  Iron and vitamin B12 deficiency anemia: Baseline Hgb 10-12> 9.2 (admit)> 9.4.  Iron saturation 3%.  Vitamin B12 is 131. -Gave IV Feraheme prior to discharge.  Received IV vitamin B12 1000 mcg -Discharged on oral iron and oral vitamin B12 -Senokot-S as needed for constipation  Chronic pain on chronic opiates -Discharged on home oxycodone.  Added bowel regimen.  Urinary incontinence: family concerned about this.  However, patient believes this is more psychological issue after prolonged condom catheter and diaper use in the setting of lung disease hospitalization previously.  He says he always have weak stream since young age.  He denies dysuria and hematuria.  -Encouraged using urinal and retraining.  Generalized weakness/debility -Resume home PT/OT.  GERD: -PPI  Nutrition Problem: Increased nutrient needs Etiology: post-op healing  Signs/Symptoms: estimated needs  Interventions: Ensure Enlive (each supplement provides 350kcal and 20 grams of protein), MVI  Discharge Instructions  Discharge Instructions    Call MD for:  difficulty breathing, headache or visual disturbances   Complete by: As directed    Call MD for:  extreme fatigue   Complete by: As directed    Call MD for:  persistant dizziness or light-headedness   Complete by: As directed    Call MD for:  persistant nausea and vomiting   Complete by: As directed    Call MD for:  redness, tenderness, or signs of infection (pain, swelling, redness, odor or green/yellow discharge around incision site)   Complete by: As directed    Call MD for:  severe uncontrolled pain   Complete by: As directed    Diet - low sodium heart  healthy   Complete by: As directed    Discharge instructions   Complete by: As directed    It has been a pleasure taking care of you! You were hospitalized with malaise, fever and weakness likely due to infection in your neck.  You were started you on IV antibiotics.  We are discharging you on this IV antibiotics at least for the next 8 weeks.  We have also started you on vitamin B12 and iron for your anemia. Please review your new medication list and the directions before you take your medications.  Please follow-up with infectious disease doctors as recommended to you.  Take care,   Home infusion instructions   Complete by: As directed    Instructions: Flushing of vascular access device: 0.9% NaCl pre/post medication administration and prn patency; Heparin 100 u/ml, 38m for implanted ports and Heparin 10u/ml, 54mfor all other central venous catheters.   Home infusion instructions   Complete by: As directed    Instructions: Flushing of vascular access device: 0.9% NaCl pre/post medication administration and prn patency; Heparin 100 u/ml, 107m47mor implanted ports and Heparin 10u/ml, 107ml30mr all other central venous catheters.   Increase activity slowly   Complete by: As directed      Allergies as of 02/07/2020      Reactions   Ceclor [cefaclor] Rash   Sulfa Antibiotics Rash, Other (See Comments)   SEVERE RASH- childhood allergy      Medication List    STOP taking these medications   enalapril  2.5 MG tablet Commonly known as: VASOTEC   potassium chloride SA 20 MEQ tablet Commonly known as: KLOR-CON   senna 8.6 MG Tabs tablet Commonly known as: SENOKOT   torsemide 10 MG tablet Commonly known as: DEMADEX     TAKE these medications   albuterol 108 (90 Base) MCG/ACT inhaler Commonly known as: ProAir HFA INHALE 2 PUFFS INTO THE LUNGS EVERY 6 HOURS AS NEEDED FOR WHEEZING ORSHORTNESS OF BREATH What changed:   how much to take  how to take this  when to take this  reasons  to take this  additional instructions Notes to patient: Last dose given 02/19 09:15   cyanocobalamin 1000 MCG tablet Take 1 tablet (1,000 mcg total) by mouth daily.   feeding supplement (ENSURE ENLIVE) Liqd Take 237 mLs by mouth 2 (two) times daily between meals.   ferrous sulfate 325 (65 FE) MG EC tablet Take 1 tablet (325 mg total) by mouth 2 (two) times daily.   folic acid 1 MG tablet Commonly known as: FOLVITE Take 1 tablet (1 mg total) by mouth daily.   gabapentin 300 MG capsule Commonly known as: NEURONTIN Take 1 capsule (300 mg total) by mouth 3 (three) times daily.   magnesium gluconate 500 MG tablet Commonly known as: MAGONATE Take 500 mg by mouth daily. Notes to patient: Resume home regimen   Melatonin 5 MG Subl Place 5 mg under the tongue at bedtime.   multivitamin with minerals Tabs tablet Take 1 tablet by mouth daily.   nafcillin  IVPB Inject 12 g into the vein daily. As a continuous infusion. Indication:  Discitis/osteomyelitis Last Day of Therapy:  04/01/2020 Labs - Once weekly:  CBC/D and BMP, Labs - Every other week:  ESR and CRP   OVER THE COUNTER MEDICATION Take 1 tablet by mouth daily. QC one supplement   Oxycodone HCl 10 MG Tabs Take 1 tablet (10 mg total) by mouth every 4 (four) hours while awake. What changed: how much to take   pantoprazole 40 MG tablet Commonly known as: PROTONIX Take 1 tablet (40 mg total) by mouth daily at 12 noon. Notes to patient: Resume home regimen   senna-docusate 8.6-50 MG tablet Commonly known as: Senokot-S Take 1 tablet by mouth at bedtime as needed for mild constipation.   testosterone cypionate 200 MG/ML injection Commonly known as: DEPOTESTOSTERONE CYPIONATE Inject 180 mg into the muscle every 14 (fourteen) days. Notes to patient: Resume home regimen   thiamine 100 MG tablet Take 1 tablet (100 mg total) by mouth daily. Notes to patient: Resume home regimen            Home Infusion Instuctions   (From admission, onward)         Start     Ordered   02/06/20 0000  Home infusion instructions    Question:  Instructions  Answer:  Flushing of vascular access device: 0.9% NaCl pre/post medication administration and prn patency; Heparin 100 u/ml, 22m for implanted ports and Heparin 10u/ml, 524mfor all other central venous catheters.   02/06/20 1250   02/06/20 0000  Home infusion instructions    Question:  Instructions  Answer:  Flushing of vascular access device: 0.9% NaCl pre/post medication administration and prn patency; Heparin 100 u/ml, 66m69mor implanted ports and Heparin 10u/ml, 66ml68mr all other central venous catheters.   02/06/20 1712          Consultations:  Neurosurgery, infectious disease  Procedures/Studies:  Needle aspiration of postop fluid collection  MR CERVICAL SPINE W WO CONTRAST  Result Date: 02/04/2020 CLINICAL DATA:  MSSA bacteremia. Thoracic epidural abscess status post laminectomy and IV antibiotics. Fever. Elevated serum inflammatory markers EXAM: MRI CERVICAL AND THORACIC SPINE WITHOUT AND WITH CONTRAST TECHNIQUE: Multiplanar and multiecho pulse sequences of the cervical spine, to include the craniocervical junction and cervicothoracic junction, and thoracic spine, were obtained without and with intravenous contrast. CONTRAST:  82m GADAVIST GADOBUTROL 1 MMOL/ML IV SOLN COMPARISON:  MRI 12/18/2019 FINDINGS: Technical note: Motion degraded examination. MRI CERVICAL SPINE FINDINGS Alignment: Straightening of the cervical lordosis. Vertebrae: Fluid signal within the C7-T1 disc space and interval collapse of disc height with loss of definition of the vertebral body endplates compatible with discitis. There is low T1 signal changes with high T2 signal and enhancement within the C7, T1, T2, and T3 vertebral bodies compatible with osteomyelitis. Cord: Enhancing material within the epidural space of the lower cervical spine extending cranially to approximately the C4  level. Posterior Fossa, vertebral arteries, paraspinal tissues: Extensive soft tissue edema and enhancement of the posterior paraspinal soft tissues. There is prevertebral edema and enhancement anterior to the cervicothoracic junction. Right apical pleuroparenchymal thickening/scarring. Disc levels: Multilevel degenerative changes of the cervical spine including multiple levels of mild-to-moderate canal stenosis. Canal stenosis appears moderate at C3-4 and C4-5. MRI THORACIC SPINE FINDINGS Alignment:  Physiologic. Vertebrae:  Osteomyelitis of the T1, T2, and T3 vertebral bodies. Cord: Enhancing collection in the posterior epidural space extends to the T5 level (series 34, image 10). Circumferential enhancing epidural collection at the T2 level extending cranially into the cervical spine to approximately the C4 level (series 34, image 9). Enhancing material involves the bilateral neural foramina at T1 through T4. There is prevertebral phlegmonous changes anteriorly centered at the cervicothoracic junction. Paraspinal and other soft tissues: Large rim enhancing collection in the posterior paraspinal soft tissues at laminectomy site in the upper thoracic spine extending to the posterior epidural space with enhancing phlegmonous soft tissue extending into the epidural space of the cervical and thoracic spines. Posterior paraspinal collection measures up to 4.1 x 3.3 cm trans axially by 8.8 cm cranial caudally. There is marked surrounding soft tissue edema and enhancement suggesting cellulitis. Disc levels: No high-grade foraminal or canal stenosis of the thoracic spine. IMPRESSION: 1. C7-T1 discitis-osteomyelitis. There is acute osteomyelitis also involving the T2 and T3 vertebral bodies. 2. Enhancing epidural phlegmon/abscess extending from approximately C4 through T5. 3. Rim-enhancing fluid collection at the laminectomy bed abutting the posterior epidural space. Collection measures approximately 9 x 3 x 4 cm. 4.  Anterior prevertebral phlegmonous changes centered at cervicothoracic junction. These results will be called to the ordering clinician or representative by the Radiologist Assistant, and communication documented in the PACS or zVision Dashboard. Electronically Signed   By: NDavina PokeD.O.   On: 02/04/2020 09:42   MR THORACIC SPINE W WO CONTRAST  Result Date: 02/04/2020 CLINICAL DATA:  MSSA bacteremia. Thoracic epidural abscess status post laminectomy and IV antibiotics. Fever. Elevated serum inflammatory markers EXAM: MRI CERVICAL AND THORACIC SPINE WITHOUT AND WITH CONTRAST TECHNIQUE: Multiplanar and multiecho pulse sequences of the cervical spine, to include the craniocervical junction and cervicothoracic junction, and thoracic spine, were obtained without and with intravenous contrast. CONTRAST:  831mGADAVIST GADOBUTROL 1 MMOL/ML IV SOLN COMPARISON:  MRI 12/18/2019 FINDINGS: Technical note: Motion degraded examination. MRI CERVICAL SPINE FINDINGS Alignment: Straightening of the cervical lordosis. Vertebrae: Fluid signal within the C7-T1 disc space and interval collapse of disc height with loss of definition  of the vertebral body endplates compatible with discitis. There is low T1 signal changes with high T2 signal and enhancement within the C7, T1, T2, and T3 vertebral bodies compatible with osteomyelitis. Cord: Enhancing material within the epidural space of the lower cervical spine extending cranially to approximately the C4 level. Posterior Fossa, vertebral arteries, paraspinal tissues: Extensive soft tissue edema and enhancement of the posterior paraspinal soft tissues. There is prevertebral edema and enhancement anterior to the cervicothoracic junction. Right apical pleuroparenchymal thickening/scarring. Disc levels: Multilevel degenerative changes of the cervical spine including multiple levels of mild-to-moderate canal stenosis. Canal stenosis appears moderate at C3-4 and C4-5. MRI THORACIC SPINE  FINDINGS Alignment:  Physiologic. Vertebrae:  Osteomyelitis of the T1, T2, and T3 vertebral bodies. Cord: Enhancing collection in the posterior epidural space extends to the T5 level (series 34, image 10). Circumferential enhancing epidural collection at the T2 level extending cranially into the cervical spine to approximately the C4 level (series 34, image 9). Enhancing material involves the bilateral neural foramina at T1 through T4. There is prevertebral phlegmonous changes anteriorly centered at the cervicothoracic junction. Paraspinal and other soft tissues: Large rim enhancing collection in the posterior paraspinal soft tissues at laminectomy site in the upper thoracic spine extending to the posterior epidural space with enhancing phlegmonous soft tissue extending into the epidural space of the cervical and thoracic spines. Posterior paraspinal collection measures up to 4.1 x 3.3 cm trans axially by 8.8 cm cranial caudally. There is marked surrounding soft tissue edema and enhancement suggesting cellulitis. Disc levels: No high-grade foraminal or canal stenosis of the thoracic spine. IMPRESSION: 1. C7-T1 discitis-osteomyelitis. There is acute osteomyelitis also involving the T2 and T3 vertebral bodies. 2. Enhancing epidural phlegmon/abscess extending from approximately C4 through T5. 3. Rim-enhancing fluid collection at the laminectomy bed abutting the posterior epidural space. Collection measures approximately 9 x 3 x 4 cm. 4. Anterior prevertebral phlegmonous changes centered at cervicothoracic junction. These results will be called to the ordering clinician or representative by the Radiologist Assistant, and communication documented in the PACS or zVision Dashboard. Electronically Signed   By: Davina Poke D.O.   On: 02/04/2020 09:42   DG Chest Port 1 View  Result Date: 02/04/2020 CLINICAL DATA:  Fever EXAM: PORTABLE CHEST 1 VIEW COMPARISON:  December 21, 2019 FINDINGS: The heart size and mediastinal  contours are within normal limits. Both lungs are clear. The visualized skeletal structures are unremarkable. IMPRESSION: No active disease. Electronically Signed   By: Constance Holster M.D.   On: 02/04/2020 00:39   VAS [place] LOWER EXTREMITY VENOUS (DVT)  Result Date: 02/04/2020  Lower Venous DVTStudy Indications: Edema.  Risk Factors: None identified. Limitations: Poor ultrasound/tissue interface. Comparison Study: No prior studies. Performing Technologist: Oliver Hum RVT  Examination Guidelines: A complete evaluation includes B-mode imaging, spectral Doppler, color Doppler, and power Doppler as needed of all accessible portions of each vessel. Bilateral testing is considered an integral part of a complete examination. Limited examinations for reoccurring indications may be performed as noted. The reflux portion of the exam is performed with the patient in reverse Trendelenburg.  +---------+---------------+---------+-----------+----------+--------------+ RIGHT    CompressibilityPhasicitySpontaneityPropertiesThrombus Aging +---------+---------------+---------+-----------+----------+--------------+ CFV      Full           Yes      Yes                                 +---------+---------------+---------+-----------+----------+--------------+ SFJ  Full                                                        +---------+---------------+---------+-----------+----------+--------------+ FV Prox  Full                                                        +---------+---------------+---------+-----------+----------+--------------+ FV Mid   Full                                                        +---------+---------------+---------+-----------+----------+--------------+ FV DistalFull                                                        +---------+---------------+---------+-----------+----------+--------------+ PFV      Full                                                         +---------+---------------+---------+-----------+----------+--------------+ POP      Full           Yes      Yes                                 +---------+---------------+---------+-----------+----------+--------------+ PTV      Full                                                        +---------+---------------+---------+-----------+----------+--------------+ PERO     Full                                                        +---------+---------------+---------+-----------+----------+--------------+   +---------+---------------+---------+-----------+----------+--------------+ LEFT     CompressibilityPhasicitySpontaneityPropertiesThrombus Aging +---------+---------------+---------+-----------+----------+--------------+ CFV      Full           Yes      Yes                                 +---------+---------------+---------+-----------+----------+--------------+ SFJ      Full                                                        +---------+---------------+---------+-----------+----------+--------------+  FV Prox  Full                                                        +---------+---------------+---------+-----------+----------+--------------+ FV Mid   Full                                                        +---------+---------------+---------+-----------+----------+--------------+ FV DistalFull                                                        +---------+---------------+---------+-----------+----------+--------------+ PFV      Full                                                        +---------+---------------+---------+-----------+----------+--------------+ POP      Full           Yes      Yes                                 +---------+---------------+---------+-----------+----------+--------------+ PTV      Full                                                         +---------+---------------+---------+-----------+----------+--------------+ PERO     Full                                                        +---------+---------------+---------+-----------+----------+--------------+     Summary: RIGHT: - There is no evidence of deep vein thrombosis in the lower extremity.  - No cystic structure found in the popliteal fossa.  LEFT: - There is no evidence of deep vein thrombosis in the lower extremity.  - No cystic structure found in the popliteal fossa.  *See table(s) above for measurements and observations. Electronically signed by Harold Barban MD on 02/04/2020 at 6:36:42 PM.    Final    [place] EKG SITE RITE  Result Date: 02/06/2020 If Site Rite image not attached, placement could not be confirmed due to current cardiac rhythm.  [place] EKG SITE RITE  Result Date: 02/06/2020 If Site Rite image not attached, placement could not be confirmed due to current cardiac rhythm.     Discharge Exam: Vitals:   02/07/20 0831 02/07/20 1302  BP: (!) 122/58 125/64  Pulse: 87 74  Resp: 18 18  Temp: 99.1 F (37.3 C) 99 F (37.2 C)  SpO2: 92% 94%    GENERAL: No acute  distress.  Appears well.  HEENT: MMM.  Vision and hearing grossly intact.  NECK: Supple.  No apparent JVD.  RESP:  No IWOB. Good air movement bilaterally. CVS:  RRR. Heart sounds normal.  ABD/GI/GU: Bowel sounds present. Soft. Non tender.  MSK/EXT:  Moves extremities. No apparent deformity or edema.  SKIN: no apparent skin lesion or wound NEURO: Awake, alert and oriented appropriately.  No apparent focal neuro deficit. PSYCH: Calm. Normal affect.    The results of significant diagnostics from this hospitalization (including imaging, microbiology, ancillary and laboratory) are listed below for reference.     Microbiology: Recent Results (from the past 240 hour(s))  Urine culture     Status: Abnormal   Collection Time: 02/04/20 12:13 AM   Specimen: Urine, Random  Result Value Ref Range Status    Specimen Description URINE, RANDOM  Final   Special Requests   Final    NONE Performed at [facility], [address] [place], [place] [address]    Culture MULTIPLE SPECIES PRESENT, SUGGEST RECOLLECTION (A)  Final   Report Status 02/05/2020 FINAL  Final  Blood culture (routine x 2)     Status: None (Preliminary result)   Collection Time: 02/04/20  1:28 AM   Specimen: BLOOD  Result Value Ref Range Status   Specimen Description BLOOD RIGHT ARM  Final   Special Requests   Final    BOTTLES DRAWN AEROBIC AND ANAEROBIC Blood Culture results may not be optimal due to an inadequate volume of blood received in culture bottles Performed at [facility], [address] [place], [place] [address]    Culture NO GROWTH 3 DAYS  Final   Report Status PENDING  Incomplete  Blood culture (routine x 2)     Status: None (Preliminary result)   Collection Time: 02/04/20  1:28 AM   Specimen: BLOOD  Result Value Ref Range Status   Specimen Description BLOOD RIGHT HAND  Final   Special Requests   Final    BOTTLES DRAWN AEROBIC AND ANAEROBIC Blood Culture adequate volume Performed at [facility], [place] [address], [place], [place] [address]    Culture NO GROWTH 3 DAYS  Final   Report Status PENDING  Incomplete  Respiratory Panel by RT PCR (Flu A&B, Covid) - Urine, Clean Catch     Status: None   Collection Time: 02/04/20  1:36 AM   Specimen: Urine, Clean Catch  Result Value Ref Range Status   SARS Coronavirus 2 by RT PCR NEGATIVE NEGATIVE Final    Comment: (NOTE) SARS-CoV-2 target nucleic acids are NOT DETECTED. The SARS-CoV-2 RNA is generally detectable in upper respiratoy specimens during the acute phase of infection. The lowest concentration of SARS-CoV-2 viral copies this assay can detect is 131 copies/mL. A negative result does not preclude SARS-Cov-2 infection and should not be used as the sole basis for treatment or other patient management decisions. A negative result may  occur with  improper specimen collection/handling, submission of specimen other than nasopharyngeal swab, presence of viral mutation(s) within the areas targeted by this assay, and inadequate number of viral copies (<131 copies/mL). A negative result must be combined with clinical observations, patient history, and epidemiological information. The expected result is Negative. Fact Sheet for Patients:  PinkCheek.be Fact Sheet for Healthcare Providers:  GravelBags.it This test is not yet ap proved or cleared by the [place] FDA and  has been authorized for detection and/or diagnosis of SARS-CoV-2 by FDA under an Emergency  Use Authorization (EUA). This EUA will remain  in effect (meaning this test can be used) for the duration of the COVID-19 declaration under Section 564(b)(1) of the Act, 21 U.S.C. section 360bbb-3(b)(1), unless the authorization is terminated or revoked sooner.    Influenza A by PCR NEGATIVE NEGATIVE Final   Influenza B by PCR NEGATIVE NEGATIVE Final    Comment: (NOTE) The Xpert Xpress SARS-CoV-2/FLU/RSV assay is intended as an aid in  the diagnosis of influenza from Nasopharyngeal swab specimens and  should not be used as a sole basis for treatment. Nasal washings and  aspirates are unacceptable for Xpert Xpress SARS-CoV-2/FLU/RSV  testing. Fact Sheet for Patients: PinkCheek.be Fact Sheet for Healthcare Providers: GravelBags.it This test is not yet approved or cleared by the [place] FDA and  has been authorized for detection and/or diagnosis of SARS-CoV-2 by  FDA under an Emergency Use Authorization (EUA). This EUA will remain  in effect (meaning this test can be used) for the duration of the  Covid-19 declaration under Section 564(b)(1) of the Act, 21  U.S.C. section 360bbb-3(b)(1), unless the authorization is  terminated or  revoked. Performed at [facility], [place] [address] [place], [place] [address]   Body fluid culture     Status: None (Preliminary result)   Collection Time: 02/05/20  9:11 AM   Specimen: Body Fluid  Result Value Ref Range Status   Specimen Description FLUID THORACIC SUPERFICIAL FROM ASPIRATION  Final   Special Requests NONE  Final   Gram Stain   Final    RARE WBC PRESENT,BOTH PMN AND MONONUCLEAR NO ORGANISMS SEEN Performed at [facility], [address], [place], [place] [address]    Culture NO GROWTH 2 DAYS  Final   Report Status PENDING  Incomplete     Labs: BNP (last 3 results) Recent Labs    12/18/19 0034 01/26/20 1420  BNP 74.0 27.0   Basic Metabolic Panel: Recent Labs  Lab 02/04/20 0128 02/05/20 0214 02/06/20 0521 02/07/20 0320  NA 135 137 138 139  K 4.2 4.2 4.0 3.9  CL 95* 97* 99 97*  CO2 '28 28 29 31  '$ GLUCOSE 142* 113* 166* 112*  BUN '19 20 19 16  '$ CREATININE 1.38* 1.29* 1.30* 1.09  CALCIUM 8.3* 8.5* 8.3* 8.2*  MG  --   --  1.9 1.8  PHOS  --   --  3.7 3.9   Liver Function Tests: Recent Labs  Lab 02/04/20 0128 02/05/20 0214 02/06/20 0521 02/07/20 0320  AST 22 22  --   --   ALT 13 13  --   --   ALKPHOS 73 70  --   --   BILITOT 0.8 0.8  --   --   PROT 7.0 6.7  --   --   ALBUMIN 2.4* 2.3* 2.1* 2.1*   No results for input(s): LIPASE, AMYLASE in the last 168 hours. No results for input(s): AMMONIA in the last 168 hours. CBC: Recent Labs  Lab 02/04/20 0128 02/05/20 0214 02/06/20 0521 02/07/20 0320  WBC 13.1* 13.3* 13.4* 13.5*  NEUTROABS 9.4* 9.2*  --   --   HGB 9.2* 9.9* 9.4* 8.5*  HCT 28.9* 31.1* 29.1* 26.8*  MCV 95.7 94.0 95.1 95.4  PLT 365 412* 422* 405*   Cardiac Enzymes: No results for input(s): CKTOTAL, CKMB, CKMBINDEX, TROPONINI in the last 168 hours. BNP: Invalid input(s): POCBNP CBG: No results for input(s): GLUCAP in the last 168 hours. D-Dimer No results for input(s):  DDIMER in the last 72 hours. Hgb A1c No  results for input(s): HGBA1C in the last 72 hours. Lipid Profile No results for input(s): CHOL, HDL, LDLCALC, TRIG, CHOLHDL, LDLDIRECT in the last 72 hours. Thyroid function studies No results for input(s): TSH, T4TOTAL, T3FREE, THYROIDAB in the last 72 hours.  Invalid input(s): FREET3 Anemia work up Recent Labs    02/05/20 0214  VITAMINB12 131*  FOLATE 34.8  FERRITIN 925*  TIBC 167*  IRON 13*  RETICCTPCT 1.7   Urinalysis    Component Value Date/Time   COLORURINE YELLOW 02/04/2020 0151   APPEARANCEUR CLOUDY (A) 02/04/2020 0151   LABSPEC 1.010 02/04/2020 0151   PHURINE 7.0 02/04/2020 0151   GLUCOSEU NEGATIVE 02/04/2020 0151   HGBUR NEGATIVE 02/04/2020 0151   BILIRUBINUR NEGATIVE 02/04/2020 0151   KETONESUR NEGATIVE 02/04/2020 0151   PROTEINUR 100 (A) 02/04/2020 0151   NITRITE NEGATIVE 02/04/2020 0151   LEUKOCYTESUR LARGE (A) 02/04/2020 0151   Sepsis Labs Invalid input(s): PROCALCITONIN,  WBC,  LACTICIDVEN   Time coordinating discharge: 45 minutes  SIGNED:  Mercy Riding, MD  [facility] 02/07/2020, 1:05 PM  If 7PM-7AM, please contact night-coverage [email or web address] Password [email or web address]

## 2020-02-08 LAB — BODY FLUID CULTURE: Culture: NO GROWTH

## 2020-02-09 ENCOUNTER — Telehealth: Payer: Self-pay

## 2020-02-09 ENCOUNTER — Encounter: Payer: Self-pay | Admitting: Infectious Disease

## 2020-02-09 DIAGNOSIS — G20 Parkinson's disease: Secondary | ICD-10-CM | POA: Diagnosis not present

## 2020-02-09 DIAGNOSIS — Z452 Encounter for adjustment and management of vascular access device: Secondary | ICD-10-CM | POA: Diagnosis not present

## 2020-02-09 DIAGNOSIS — J439 Emphysema, unspecified: Secondary | ICD-10-CM | POA: Diagnosis not present

## 2020-02-09 DIAGNOSIS — M1711 Unilateral primary osteoarthritis, right knee: Secondary | ICD-10-CM | POA: Diagnosis not present

## 2020-02-09 DIAGNOSIS — G061 Intraspinal abscess and granuloma: Secondary | ICD-10-CM | POA: Diagnosis not present

## 2020-02-09 DIAGNOSIS — M7520 Bicipital tendinitis, unspecified shoulder: Secondary | ICD-10-CM | POA: Diagnosis not present

## 2020-02-09 DIAGNOSIS — B9561 Methicillin susceptible Staphylococcus aureus infection as the cause of diseases classified elsewhere: Secondary | ICD-10-CM | POA: Diagnosis not present

## 2020-02-09 DIAGNOSIS — I2510 Atherosclerotic heart disease of native coronary artery without angina pectoris: Secondary | ICD-10-CM | POA: Diagnosis not present

## 2020-02-09 DIAGNOSIS — M4804 Spinal stenosis, thoracic region: Secondary | ICD-10-CM | POA: Diagnosis not present

## 2020-02-09 DIAGNOSIS — G8929 Other chronic pain: Secondary | ICD-10-CM | POA: Diagnosis not present

## 2020-02-09 DIAGNOSIS — Z9181 History of falling: Secondary | ICD-10-CM | POA: Diagnosis not present

## 2020-02-09 DIAGNOSIS — Z792 Long term (current) use of antibiotics: Secondary | ICD-10-CM | POA: Diagnosis not present

## 2020-02-09 DIAGNOSIS — I700 Atherosclerosis of aorta: Secondary | ICD-10-CM | POA: Diagnosis not present

## 2020-02-09 DIAGNOSIS — T8149XA Infection following a procedure, other surgical site, initial encounter: Secondary | ICD-10-CM | POA: Diagnosis not present

## 2020-02-09 LAB — CULTURE, BLOOD (ROUTINE X 2)
Culture: NO GROWTH
Culture: NO GROWTH
Special Requests: ADEQUATE

## 2020-02-09 NOTE — Telephone Encounter (Signed)
If he is still taking his blood pressure medication, have him stop it and talk to his PCP.   Did he fall due to being light headed?  If so, have him increase his water intake today.

## 2020-02-09 NOTE — Telephone Encounter (Signed)
Kent Flowers from [facility] is calling to report  Blood pressure is low at 96/64 but asymptomatic , low grade fever and fell today with no injury.   Dr Linus Salmons is following patient for IV antibiotics.   Please advise.   Laverle Patter, RN

## 2020-02-10 ENCOUNTER — Telehealth: Payer: Self-pay | Admitting: Internal Medicine

## 2020-02-10 ENCOUNTER — Telehealth: Payer: Self-pay | Admitting: *Deleted

## 2020-02-10 NOTE — Telephone Encounter (Signed)
LVM detailed message (per DPR) for patient spouse as to what a pet scan is and to call the facility where the test will be done tomorrow if she has more detailed questions.  Nothing further needed at this time.

## 2020-02-10 NOTE — Telephone Encounter (Signed)
Voicemail not able to leave message .  Will call patient.   I spoke with patient and he stated he was not aware of falling or having a low blood pressure.  He seem confused so I asked to speak with his wife and she was not at home at the time.  I will try later to speak with the wife to confirm there were not issues.   Laverle Patter, RN

## 2020-02-10 NOTE — Telephone Encounter (Signed)
Received lab results by fax with CRP, Cardiac = 200.30. Per Dr Linus Salmons, the wrong CRP was drawn, needs to be non-cardiac. RN relayed this to [facility] at [facility]. Landis Gandy, RN

## 2020-02-11 ENCOUNTER — Encounter (HOSPITAL_COMMUNITY)
Admission: RE | Admit: 2020-02-11 | Discharge: 2020-02-11 | Disposition: A | Discharge status: Home / Self Care | Payer: Medicare Other | Source: Ambulatory Visit | Attending: Internal Medicine | Admitting: Internal Medicine

## 2020-02-11 ENCOUNTER — Other Ambulatory Visit: Payer: Self-pay

## 2020-02-11 DIAGNOSIS — N3289 Other specified disorders of bladder: Secondary | ICD-10-CM | POA: Diagnosis not present

## 2020-02-11 DIAGNOSIS — M5430 Sciatica, unspecified side: Secondary | ICD-10-CM | POA: Diagnosis not present

## 2020-02-11 DIAGNOSIS — I700 Atherosclerosis of aorta: Secondary | ICD-10-CM | POA: Diagnosis not present

## 2020-02-11 DIAGNOSIS — I2510 Atherosclerotic heart disease of native coronary artery without angina pectoris: Secondary | ICD-10-CM | POA: Insufficient documentation

## 2020-02-11 DIAGNOSIS — R590 Localized enlarged lymph nodes: Secondary | ICD-10-CM | POA: Insufficient documentation

## 2020-02-11 DIAGNOSIS — R918 Other nonspecific abnormal finding of lung field: Secondary | ICD-10-CM | POA: Diagnosis not present

## 2020-02-11 DIAGNOSIS — G894 Chronic pain syndrome: Secondary | ICD-10-CM | POA: Diagnosis not present

## 2020-02-11 DIAGNOSIS — R339 Retention of urine, unspecified: Secondary | ICD-10-CM | POA: Insufficient documentation

## 2020-02-11 DIAGNOSIS — B9561 Methicillin susceptible Staphylococcus aureus infection as the cause of diseases classified elsewhere: Secondary | ICD-10-CM | POA: Diagnosis not present

## 2020-02-11 DIAGNOSIS — R911 Solitary pulmonary nodule: Secondary | ICD-10-CM | POA: Insufficient documentation

## 2020-02-11 DIAGNOSIS — M1712 Unilateral primary osteoarthritis, left knee: Secondary | ICD-10-CM | POA: Diagnosis not present

## 2020-02-11 DIAGNOSIS — R7881 Bacteremia: Secondary | ICD-10-CM | POA: Diagnosis not present

## 2020-02-11 DIAGNOSIS — A4901 Methicillin susceptible Staphylococcus aureus infection, unspecified site: Secondary | ICD-10-CM | POA: Diagnosis not present

## 2020-02-11 DIAGNOSIS — N1330 Unspecified hydronephrosis: Secondary | ICD-10-CM | POA: Diagnosis not present

## 2020-02-11 DIAGNOSIS — G062 Extradural and subdural abscess, unspecified: Secondary | ICD-10-CM | POA: Diagnosis not present

## 2020-02-11 DIAGNOSIS — Z79899 Other long term (current) drug therapy: Secondary | ICD-10-CM | POA: Diagnosis not present

## 2020-02-11 LAB — GLUCOSE, CAPILLARY: Glucose-Capillary: 98 mg/dL (ref 70–99)

## 2020-02-11 IMAGING — PT NM PET TUM IMG INITIAL (PI) SKULL BASE T - THIGH
8 series · 25 of 25 positions shown · non-contrast
Comparison: MR cervical and thoracic spine [DATE], CT chest
[DATE], [DATE] and [DATE].

CLINICAL DATA: Initial treatment strategy for lung nodule.

EXAM:
NUCLEAR MEDICINE PET SKULL BASE TO THIGH
TECHNIQUE: 8.5 mCi F-18 FDG was injected intravenously. Full-ring PET imaging
was performed from the skull base to thigh after the radiotracer. CT
data was obtained and used for attenuation correction and anatomic
localization.
Fasting blood glucose: 98 mg/dl

[Series 3: pet sk_thigh ac · axial · 5.0mm · 4.07mm/px · z∈[-981,-113]mm · 6 of 218 slices shown]
[im 1/218]
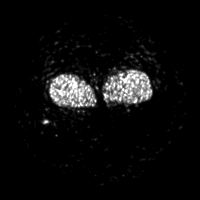
[im 44/218]
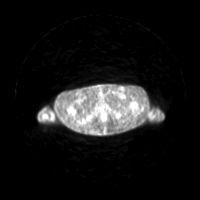
[im 87/218]
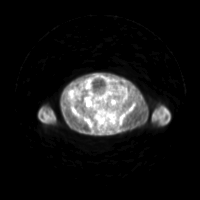
[im 131/218]
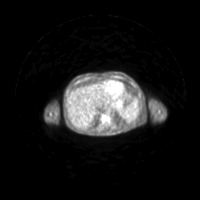
[im 174/218]
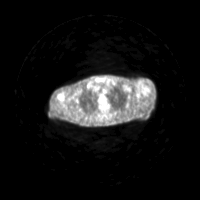
[im 218/218]
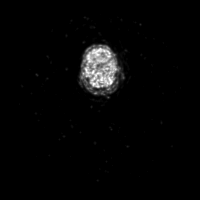

[Series 4: ct sk_thigh 5.0 b31f · axial · 5.0mm · 0.98mm/px · z∈[-981,-113]mm · 5 of 218 slices shown]
[im 1/218]
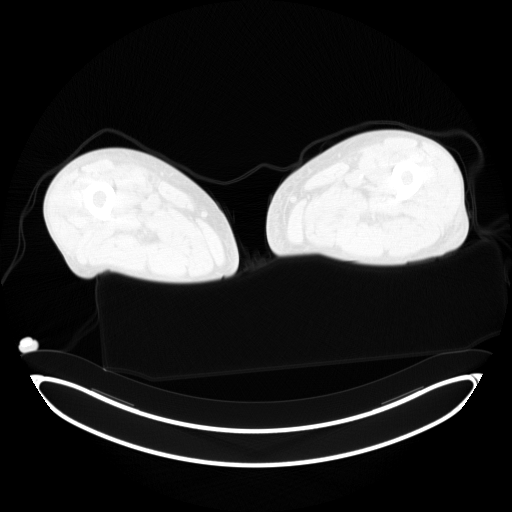
[im 55/218]
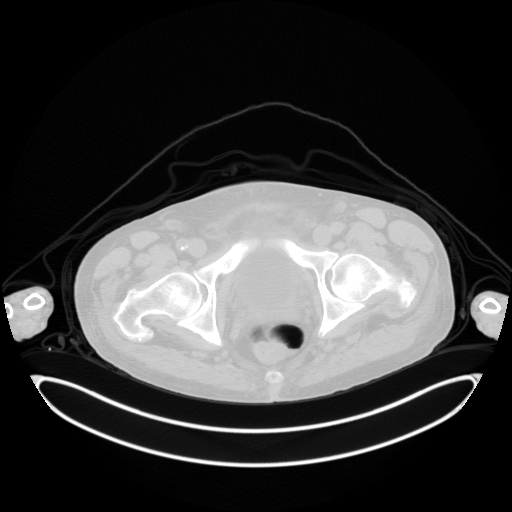
[im 109/218]
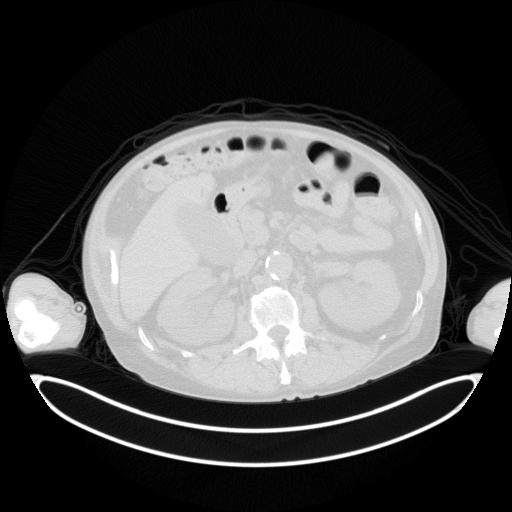
[im 163/218]
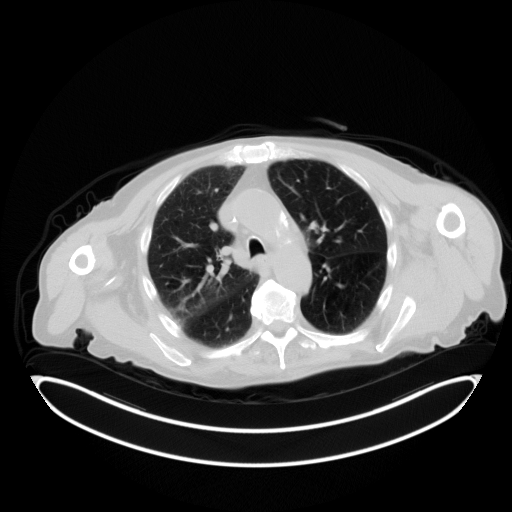
[im 218/218  brain]
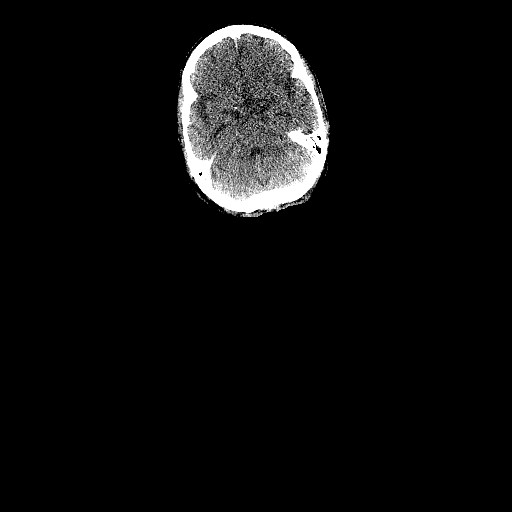

[Series 5: pet sk_thigh nac · axial · 5.0mm · 4.07mm/px · z∈[-981,-113]mm · 5 of 218 slices shown]
[im 1/218]
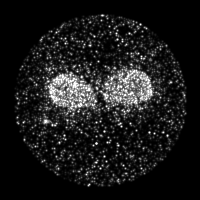
[im 55/218]
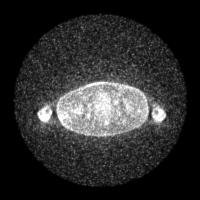
[im 109/218]
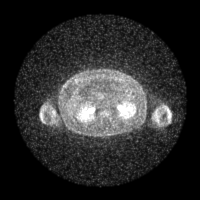
[im 163/218]
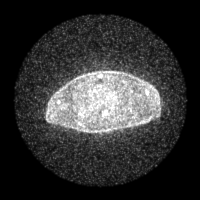
[im 218/218]
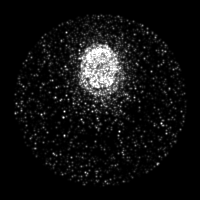

[Series 8: ct sk_thigh 5.0 b70f lung_bone · axial · 5.0mm · 0.65mm/px · 1 of 56 slices shown]
[im 1/56  bone]
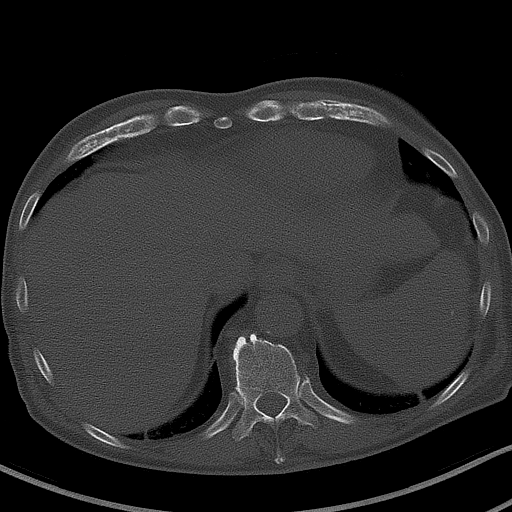

[Series 603: range-ct sk_thigh 5.0 (id)<alpha range> · 2 of 83 slices shown (1 of 2)]
[im 1/83]
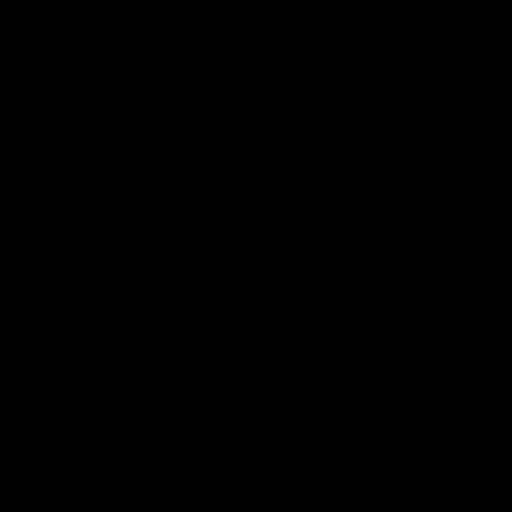
[im 83/83]
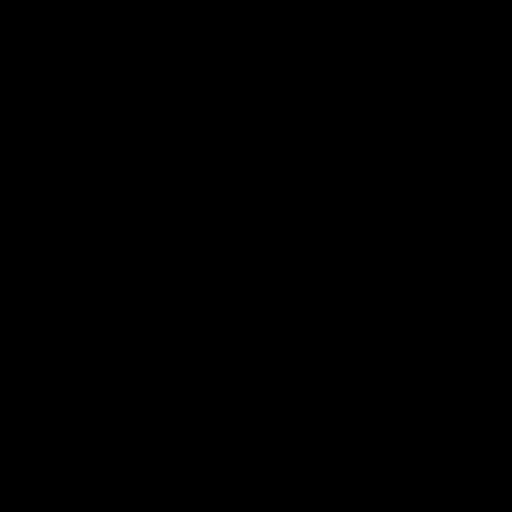

[Series 604: mip range 3 · coronal · 1.80mm/px · 1 of 32 slices shown]
[im 1/32]
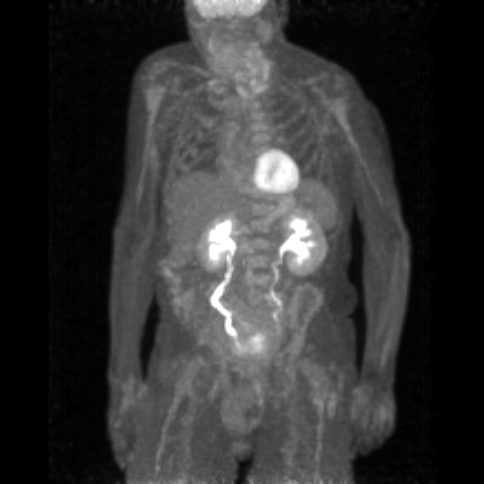

[Series 605: range-ct sk_thigh 5.0 (id)<alpha range> · 4 of 171 slices shown (2 of 2)]
[im 1/171]
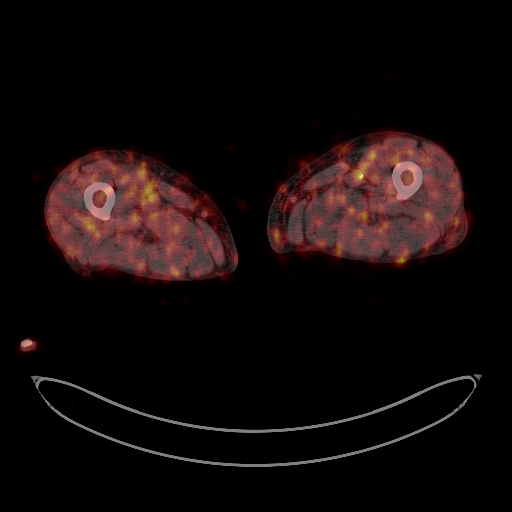
[im 57/171]
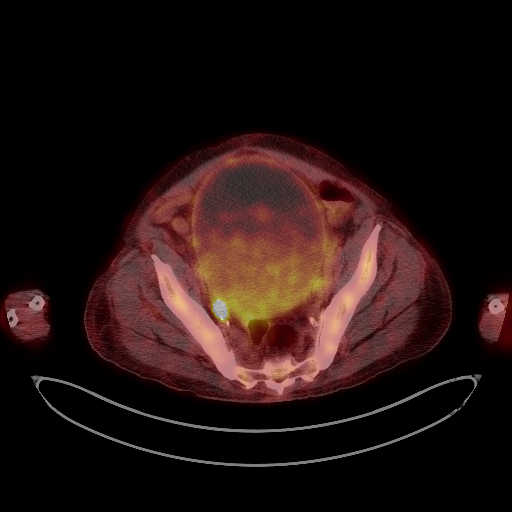
[im 114/171]
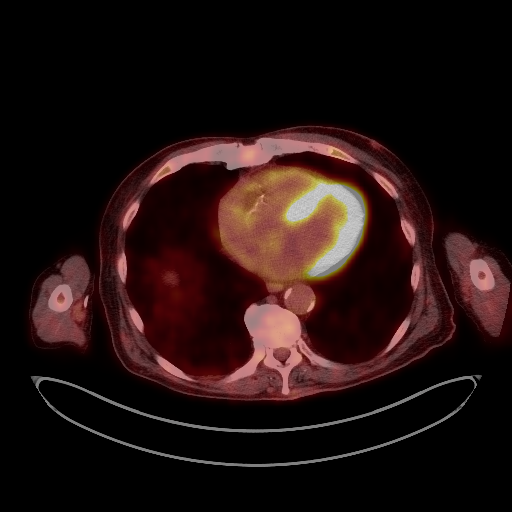
[im 171/171]
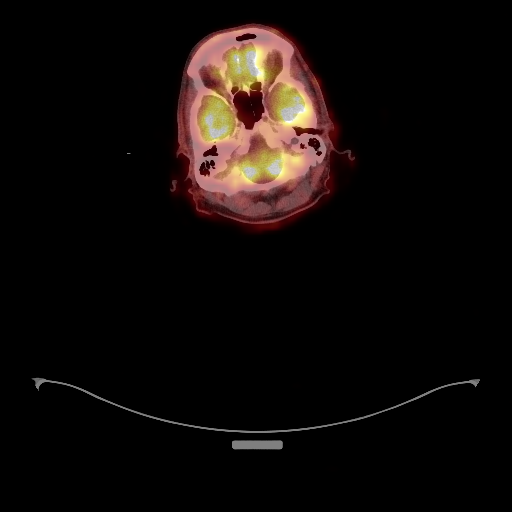

[Series 1094: results mm oncology reading · 1.0mm · 1.00mm/px · 1 of 4 slices shown]
[im 1/4]
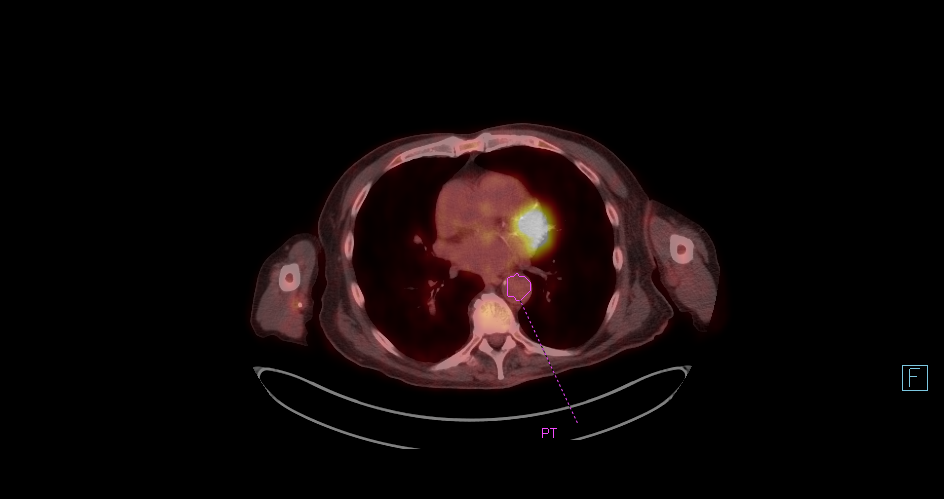

[25 of 25 positions shown; findings below may reference images not displayed]

FINDINGS: Mediastinal blood pool activity: SUV max

Liver activity: SUV max NA

NECK:

No abnormal hypermetabolism.

Incidental CT findings:

None.

CHEST:

10 mm low right paratracheal lymph node (4/59) has an SUV max of
and is similar in size when compared with [DATE]. No additional
hypermetabolic mediastinal hilar or axillary lymph nodes. Previously
seen apical right upper lobe soft tissue has resolved in the
interval. No hypermetabolic pulmonary nodules.

Incidental CT findings:

Atherosclerotic calcification of the aorta, aortic valve and
coronary arteries. No pericardial or pleural effusion. Centrilobular
emphysema.

ABDOMEN/PELVIS:

No abnormal hypermetabolism in the liver, adrenal glands or spleen.
There is mild nonspecific hypermetabolism within the head of the
pancreas with an SUV max of 4.4, without a definitive CT correlate.
No hypermetabolic lymph nodes. Inguinal lymph nodes measure up to 11
mm on the left (4/177) with metabolism below blood pool.

Incidental CT findings:

Liver, gallbladder and adrenal glands are otherwise unremarkable.
Bilateral hydronephrosis with marked bladder distension. Spleen,
pancreas, stomach and bowel are otherwise grossly unremarkable.
Atherosclerotic calcification of the aorta.

SKELETON:

Postoperative osseous and paraspinal soft tissue uptake in the lower
cervical and upper thoracic spine. Fluid collection in the soft
tissues posterior to the upper thoracic spine measures 2.8 x 2.8 cm
(4/36). Mild low level patchy metabolism throughout the visualized
osseous structures may be treatment related. No abnormal osseous
hypermetabolism.

Incidental CT findings:

Degenerative changes in the spine.
IMPRESSION: 1. Previously seen abnormal soft tissue at the apex of the right
hemithorax has resolved in the interval. There is a minimally
hypermetabolic residual low right paratracheal lymph node, which may
be reactive.
2. Mild hypermetabolism within the head of the pancreas, without a
definite CT correlate. Finding is nonspecific. If further evaluation
is desired, MR abdomen without and with contrast is suggested.
3. Bilateral hydronephrosis with marked bladder distension.
4. Borderline enlarged inguinal lymph nodes without abnormal
hypermetabolism, likely reactive.
5. Postoperative changes in the lower cervical and upper thoracic
spine, with expected mild hypermetabolism. Associated focal fluid
collection posterior to the upper thoracic spine may represent a
seroma. Difficult to exclude abscess. Please refer to thoracic spine
MR [DATE].
6. Aortic atherosclerosis ([2Z]-[2Z]). Coronary artery
calcification.

## 2020-02-11 MED ORDER — FLUDEOXYGLUCOSE F - 18 (FDG) INJECTION
8.5200 | Freq: Once | INTRAVENOUS | Status: AC | PRN
  Administered 2020-02-11: 8.52 via INTRAVENOUS

## 2020-02-12 NOTE — Telephone Encounter (Signed)
Follow up call to patient.  When asked for his wife he states she is currently in the hospital undergoing gall bladder surgery.  Made patient aware of Dr. Henreitta Leber recommendations to stop blood pressure medication.  Patient states he has not fallen, but again sounded confused about the question. Patient then placed me on a brief hold to obtain his voice recorder to repeat what was stated so he can be sure to remember to contact PCP for follow up visit.  Eugenia Mcalpine

## 2020-02-12 NOTE — Telephone Encounter (Signed)
Left voicemail with PCP triage to call patient for follow up appointment.

## 2020-02-13 ENCOUNTER — Other Ambulatory Visit: Payer: Self-pay | Admitting: *Deleted

## 2020-02-13 ENCOUNTER — Telehealth: Payer: Self-pay

## 2020-02-13 DIAGNOSIS — Z792 Long term (current) use of antibiotics: Secondary | ICD-10-CM | POA: Diagnosis not present

## 2020-02-13 DIAGNOSIS — M1711 Unilateral primary osteoarthritis, right knee: Secondary | ICD-10-CM | POA: Diagnosis not present

## 2020-02-13 DIAGNOSIS — G061 Intraspinal abscess and granuloma: Secondary | ICD-10-CM | POA: Diagnosis not present

## 2020-02-13 DIAGNOSIS — Z452 Encounter for adjustment and management of vascular access device: Secondary | ICD-10-CM | POA: Diagnosis not present

## 2020-02-13 DIAGNOSIS — I2510 Atherosclerotic heart disease of native coronary artery without angina pectoris: Secondary | ICD-10-CM | POA: Diagnosis not present

## 2020-02-13 DIAGNOSIS — J439 Emphysema, unspecified: Secondary | ICD-10-CM | POA: Diagnosis not present

## 2020-02-13 DIAGNOSIS — B9561 Methicillin susceptible Staphylococcus aureus infection as the cause of diseases classified elsewhere: Secondary | ICD-10-CM | POA: Diagnosis not present

## 2020-02-13 DIAGNOSIS — M4804 Spinal stenosis, thoracic region: Secondary | ICD-10-CM | POA: Diagnosis not present

## 2020-02-13 DIAGNOSIS — I700 Atherosclerosis of aorta: Secondary | ICD-10-CM | POA: Diagnosis not present

## 2020-02-13 DIAGNOSIS — G20 Parkinson's disease: Secondary | ICD-10-CM | POA: Diagnosis not present

## 2020-02-13 DIAGNOSIS — M7520 Bicipital tendinitis, unspecified shoulder: Secondary | ICD-10-CM | POA: Diagnosis not present

## 2020-02-13 DIAGNOSIS — G8929 Other chronic pain: Secondary | ICD-10-CM | POA: Diagnosis not present

## 2020-02-13 DIAGNOSIS — T8149XA Infection following a procedure, other surgical site, initial encounter: Secondary | ICD-10-CM | POA: Diagnosis not present

## 2020-02-13 DIAGNOSIS — Z9181 History of falling: Secondary | ICD-10-CM | POA: Diagnosis not present

## 2020-02-13 NOTE — Patient Outreach (Signed)
[place] [facility]) Care Management  02/13/2020  Kent Flowers [facility] [birth year] [id number]   RED ON EMMI ALERT - General Discharge Day # 4 Date: 2/25 Red Alert Reason: [name] if he has discharge instructions   Outreach attempt #1, unsuccessful.  HIPAA compliant voice message left.   Plan: RN CM will send unsuccessful outreach letter and follow up within the next 3-4 business days.  Valente David, [place], MSN [place] [phone]

## 2020-02-13 NOTE — Telephone Encounter (Signed)
Sonia Baller, Physical Therapist calling for verbal orders to continue physical therapy to twice weekly through week four.   Verbal order given as stated above.   I did f/u on a previous message from a nurseStanton Kidney) about his apparent fall and low blood pressure along with patients confusion.  Sonia Baller states his blood pressure today was fine , his thoughts are slow but he is able to perform ADL's and his wife was in the hospital but should be coming home today.  There were no concerns about his safety of being home along.   Laverle Patter, RN

## 2020-02-13 NOTE — Telephone Encounter (Signed)
Received call from [place] at [facility] following up about billing paperwork that was sent 2 weeks ago. Unable to locate forms, asked her to refax paperwork to be signed Monday by Dr. Linus Salmons.   Shera Laubach Lorita Officer, RN

## 2020-02-16 DIAGNOSIS — I2510 Atherosclerotic heart disease of native coronary artery without angina pectoris: Secondary | ICD-10-CM | POA: Diagnosis not present

## 2020-02-16 DIAGNOSIS — I700 Atherosclerosis of aorta: Secondary | ICD-10-CM | POA: Diagnosis not present

## 2020-02-16 DIAGNOSIS — J439 Emphysema, unspecified: Secondary | ICD-10-CM | POA: Diagnosis not present

## 2020-02-16 DIAGNOSIS — Z9181 History of falling: Secondary | ICD-10-CM | POA: Diagnosis not present

## 2020-02-16 DIAGNOSIS — Z792 Long term (current) use of antibiotics: Secondary | ICD-10-CM | POA: Diagnosis not present

## 2020-02-16 DIAGNOSIS — M7520 Bicipital tendinitis, unspecified shoulder: Secondary | ICD-10-CM | POA: Diagnosis not present

## 2020-02-16 DIAGNOSIS — G20 Parkinson's disease: Secondary | ICD-10-CM | POA: Diagnosis not present

## 2020-02-16 DIAGNOSIS — Z466 Encounter for fitting and adjustment of urinary device: Secondary | ICD-10-CM | POA: Diagnosis not present

## 2020-02-16 DIAGNOSIS — B9561 Methicillin susceptible Staphylococcus aureus infection as the cause of diseases classified elsewhere: Secondary | ICD-10-CM | POA: Diagnosis not present

## 2020-02-16 DIAGNOSIS — T8149XA Infection following a procedure, other surgical site, initial encounter: Secondary | ICD-10-CM | POA: Diagnosis not present

## 2020-02-16 DIAGNOSIS — Z452 Encounter for adjustment and management of vascular access device: Secondary | ICD-10-CM | POA: Diagnosis not present

## 2020-02-16 DIAGNOSIS — M4804 Spinal stenosis, thoracic region: Secondary | ICD-10-CM | POA: Diagnosis not present

## 2020-02-16 DIAGNOSIS — G061 Intraspinal abscess and granuloma: Secondary | ICD-10-CM | POA: Diagnosis not present

## 2020-02-16 DIAGNOSIS — G8929 Other chronic pain: Secondary | ICD-10-CM | POA: Diagnosis not present

## 2020-02-16 DIAGNOSIS — M1711 Unilateral primary osteoarthritis, right knee: Secondary | ICD-10-CM | POA: Diagnosis not present

## 2020-02-17 DIAGNOSIS — G8929 Other chronic pain: Secondary | ICD-10-CM | POA: Diagnosis not present

## 2020-02-17 DIAGNOSIS — Z9181 History of falling: Secondary | ICD-10-CM | POA: Diagnosis not present

## 2020-02-17 DIAGNOSIS — M4804 Spinal stenosis, thoracic region: Secondary | ICD-10-CM | POA: Diagnosis not present

## 2020-02-17 DIAGNOSIS — Z452 Encounter for adjustment and management of vascular access device: Secondary | ICD-10-CM | POA: Diagnosis not present

## 2020-02-17 DIAGNOSIS — M1711 Unilateral primary osteoarthritis, right knee: Secondary | ICD-10-CM | POA: Diagnosis not present

## 2020-02-17 DIAGNOSIS — I2510 Atherosclerotic heart disease of native coronary artery without angina pectoris: Secondary | ICD-10-CM | POA: Diagnosis not present

## 2020-02-17 DIAGNOSIS — T8149XA Infection following a procedure, other surgical site, initial encounter: Secondary | ICD-10-CM | POA: Diagnosis not present

## 2020-02-17 DIAGNOSIS — Z466 Encounter for fitting and adjustment of urinary device: Secondary | ICD-10-CM | POA: Diagnosis not present

## 2020-02-17 DIAGNOSIS — G20 Parkinson's disease: Secondary | ICD-10-CM | POA: Diagnosis not present

## 2020-02-17 DIAGNOSIS — B9561 Methicillin susceptible Staphylococcus aureus infection as the cause of diseases classified elsewhere: Secondary | ICD-10-CM | POA: Diagnosis not present

## 2020-02-17 DIAGNOSIS — J439 Emphysema, unspecified: Secondary | ICD-10-CM | POA: Diagnosis not present

## 2020-02-17 DIAGNOSIS — Z792 Long term (current) use of antibiotics: Secondary | ICD-10-CM | POA: Diagnosis not present

## 2020-02-17 DIAGNOSIS — M7520 Bicipital tendinitis, unspecified shoulder: Secondary | ICD-10-CM | POA: Diagnosis not present

## 2020-02-17 DIAGNOSIS — I700 Atherosclerosis of aorta: Secondary | ICD-10-CM | POA: Diagnosis not present

## 2020-02-17 DIAGNOSIS — G061 Intraspinal abscess and granuloma: Secondary | ICD-10-CM | POA: Diagnosis not present

## 2020-02-18 ENCOUNTER — Telehealth: Payer: Self-pay | Admitting: *Deleted

## 2020-02-18 DIAGNOSIS — Z792 Long term (current) use of antibiotics: Secondary | ICD-10-CM | POA: Diagnosis not present

## 2020-02-18 DIAGNOSIS — Z452 Encounter for adjustment and management of vascular access device: Secondary | ICD-10-CM | POA: Diagnosis not present

## 2020-02-18 DIAGNOSIS — M7520 Bicipital tendinitis, unspecified shoulder: Secondary | ICD-10-CM | POA: Diagnosis not present

## 2020-02-18 DIAGNOSIS — G061 Intraspinal abscess and granuloma: Secondary | ICD-10-CM | POA: Diagnosis not present

## 2020-02-18 DIAGNOSIS — T8149XA Infection following a procedure, other surgical site, initial encounter: Secondary | ICD-10-CM | POA: Diagnosis not present

## 2020-02-18 DIAGNOSIS — M542 Cervicalgia: Secondary | ICD-10-CM | POA: Diagnosis not present

## 2020-02-18 DIAGNOSIS — S91002D Unspecified open wound, left ankle, subsequent encounter: Secondary | ICD-10-CM | POA: Diagnosis not present

## 2020-02-18 DIAGNOSIS — M1711 Unilateral primary osteoarthritis, right knee: Secondary | ICD-10-CM | POA: Diagnosis not present

## 2020-02-18 DIAGNOSIS — M545 Low back pain: Secondary | ICD-10-CM | POA: Diagnosis not present

## 2020-02-18 DIAGNOSIS — E559 Vitamin D deficiency, unspecified: Secondary | ICD-10-CM | POA: Diagnosis not present

## 2020-02-18 DIAGNOSIS — Z466 Encounter for fitting and adjustment of urinary device: Secondary | ICD-10-CM | POA: Diagnosis not present

## 2020-02-18 DIAGNOSIS — Z20822 Contact with and (suspected) exposure to covid-19: Secondary | ICD-10-CM | POA: Diagnosis not present

## 2020-02-18 DIAGNOSIS — M25561 Pain in right knee: Secondary | ICD-10-CM | POA: Diagnosis not present

## 2020-02-18 DIAGNOSIS — M546 Pain in thoracic spine: Secondary | ICD-10-CM | POA: Diagnosis not present

## 2020-02-18 DIAGNOSIS — I700 Atherosclerosis of aorta: Secondary | ICD-10-CM | POA: Diagnosis not present

## 2020-02-18 DIAGNOSIS — G20 Parkinson's disease: Secondary | ICD-10-CM | POA: Diagnosis not present

## 2020-02-18 DIAGNOSIS — B9561 Methicillin susceptible Staphylococcus aureus infection as the cause of diseases classified elsewhere: Secondary | ICD-10-CM | POA: Diagnosis not present

## 2020-02-18 DIAGNOSIS — Z9181 History of falling: Secondary | ICD-10-CM | POA: Diagnosis not present

## 2020-02-18 DIAGNOSIS — K5900 Constipation, unspecified: Secondary | ICD-10-CM | POA: Diagnosis not present

## 2020-02-18 DIAGNOSIS — M129 Arthropathy, unspecified: Secondary | ICD-10-CM | POA: Diagnosis not present

## 2020-02-18 DIAGNOSIS — Z79899 Other long term (current) drug therapy: Secondary | ICD-10-CM | POA: Diagnosis not present

## 2020-02-18 DIAGNOSIS — Z9889 Other specified postprocedural states: Secondary | ICD-10-CM | POA: Diagnosis not present

## 2020-02-18 DIAGNOSIS — G8929 Other chronic pain: Secondary | ICD-10-CM | POA: Diagnosis not present

## 2020-02-18 DIAGNOSIS — J439 Emphysema, unspecified: Secondary | ICD-10-CM | POA: Diagnosis not present

## 2020-02-18 DIAGNOSIS — M4804 Spinal stenosis, thoracic region: Secondary | ICD-10-CM | POA: Diagnosis not present

## 2020-02-18 DIAGNOSIS — I2510 Atherosclerotic heart disease of native coronary artery without angina pectoris: Secondary | ICD-10-CM | POA: Diagnosis not present

## 2020-02-18 DIAGNOSIS — M25562 Pain in left knee: Secondary | ICD-10-CM | POA: Diagnosis not present

## 2020-02-18 DIAGNOSIS — M5430 Sciatica, unspecified side: Secondary | ICD-10-CM | POA: Diagnosis not present

## 2020-02-18 DIAGNOSIS — M1712 Unilateral primary osteoarthritis, left knee: Secondary | ICD-10-CM | POA: Diagnosis not present

## 2020-02-18 NOTE — Telephone Encounter (Addendum)
Faxed order --dressing change and Physical Therapy to [place].

## 2020-02-19 ENCOUNTER — Other Ambulatory Visit: Payer: Self-pay | Admitting: *Deleted

## 2020-02-19 DIAGNOSIS — M4804 Spinal stenosis, thoracic region: Secondary | ICD-10-CM | POA: Diagnosis not present

## 2020-02-19 DIAGNOSIS — G8929 Other chronic pain: Secondary | ICD-10-CM | POA: Diagnosis not present

## 2020-02-19 DIAGNOSIS — M1711 Unilateral primary osteoarthritis, right knee: Secondary | ICD-10-CM | POA: Diagnosis not present

## 2020-02-19 DIAGNOSIS — I2510 Atherosclerotic heart disease of native coronary artery without angina pectoris: Secondary | ICD-10-CM | POA: Diagnosis not present

## 2020-02-19 DIAGNOSIS — Z466 Encounter for fitting and adjustment of urinary device: Secondary | ICD-10-CM | POA: Diagnosis not present

## 2020-02-19 DIAGNOSIS — J439 Emphysema, unspecified: Secondary | ICD-10-CM | POA: Diagnosis not present

## 2020-02-19 DIAGNOSIS — T8149XA Infection following a procedure, other surgical site, initial encounter: Secondary | ICD-10-CM | POA: Diagnosis not present

## 2020-02-19 DIAGNOSIS — I700 Atherosclerosis of aorta: Secondary | ICD-10-CM | POA: Diagnosis not present

## 2020-02-19 DIAGNOSIS — G061 Intraspinal abscess and granuloma: Secondary | ICD-10-CM | POA: Diagnosis not present

## 2020-02-19 DIAGNOSIS — M7520 Bicipital tendinitis, unspecified shoulder: Secondary | ICD-10-CM | POA: Diagnosis not present

## 2020-02-19 DIAGNOSIS — B9561 Methicillin susceptible Staphylococcus aureus infection as the cause of diseases classified elsewhere: Secondary | ICD-10-CM | POA: Diagnosis not present

## 2020-02-19 DIAGNOSIS — Z9181 History of falling: Secondary | ICD-10-CM | POA: Diagnosis not present

## 2020-02-19 DIAGNOSIS — G20 Parkinson's disease: Secondary | ICD-10-CM | POA: Diagnosis not present

## 2020-02-19 DIAGNOSIS — Z452 Encounter for adjustment and management of vascular access device: Secondary | ICD-10-CM | POA: Diagnosis not present

## 2020-02-19 DIAGNOSIS — Z792 Long term (current) use of antibiotics: Secondary | ICD-10-CM | POA: Diagnosis not present

## 2020-02-19 NOTE — Patient Outreach (Signed)
[place] [facility]) Care Management  02/19/2020  Hilliard Balough [facility] 25-Jun-1946 [id number]   RED ON EMMI ALERT - General Discharge Day # 4 Date: 2/25 Red Alert Reason: [name] if he has discharge instructions   Outreach attempt #2, successful, however he state he has a nurse in the home at this time, request call back.   Update:  Call placed back to member per his request, no answer.  HIPAA compliant voice message left.  Will follow up within the next 3-4 business days.  Valente David, [place], MSN [place] [phone]

## 2020-02-20 ENCOUNTER — Encounter: Payer: Self-pay | Admitting: *Deleted

## 2020-02-20 ENCOUNTER — Other Ambulatory Visit: Payer: Self-pay | Admitting: *Deleted

## 2020-02-20 DIAGNOSIS — Z466 Encounter for fitting and adjustment of urinary device: Secondary | ICD-10-CM | POA: Diagnosis not present

## 2020-02-20 DIAGNOSIS — A4901 Methicillin susceptible Staphylococcus aureus infection, unspecified site: Secondary | ICD-10-CM | POA: Diagnosis not present

## 2020-02-20 DIAGNOSIS — G8929 Other chronic pain: Secondary | ICD-10-CM | POA: Diagnosis not present

## 2020-02-20 DIAGNOSIS — Z792 Long term (current) use of antibiotics: Secondary | ICD-10-CM | POA: Diagnosis not present

## 2020-02-20 DIAGNOSIS — T8149XA Infection following a procedure, other surgical site, initial encounter: Secondary | ICD-10-CM | POA: Diagnosis not present

## 2020-02-20 DIAGNOSIS — Z452 Encounter for adjustment and management of vascular access device: Secondary | ICD-10-CM | POA: Diagnosis not present

## 2020-02-20 DIAGNOSIS — G061 Intraspinal abscess and granuloma: Secondary | ICD-10-CM | POA: Diagnosis not present

## 2020-02-20 DIAGNOSIS — G20 Parkinson's disease: Secondary | ICD-10-CM | POA: Diagnosis not present

## 2020-02-20 DIAGNOSIS — I2510 Atherosclerotic heart disease of native coronary artery without angina pectoris: Secondary | ICD-10-CM | POA: Diagnosis not present

## 2020-02-20 DIAGNOSIS — M7520 Bicipital tendinitis, unspecified shoulder: Secondary | ICD-10-CM | POA: Diagnosis not present

## 2020-02-20 DIAGNOSIS — Z9181 History of falling: Secondary | ICD-10-CM | POA: Diagnosis not present

## 2020-02-20 DIAGNOSIS — R7881 Bacteremia: Secondary | ICD-10-CM | POA: Diagnosis not present

## 2020-02-20 DIAGNOSIS — J439 Emphysema, unspecified: Secondary | ICD-10-CM | POA: Diagnosis not present

## 2020-02-20 DIAGNOSIS — B9561 Methicillin susceptible Staphylococcus aureus infection as the cause of diseases classified elsewhere: Secondary | ICD-10-CM | POA: Diagnosis not present

## 2020-02-20 DIAGNOSIS — M1711 Unilateral primary osteoarthritis, right knee: Secondary | ICD-10-CM | POA: Diagnosis not present

## 2020-02-20 DIAGNOSIS — M4804 Spinal stenosis, thoracic region: Secondary | ICD-10-CM | POA: Diagnosis not present

## 2020-02-20 DIAGNOSIS — G062 Extradural and subdural abscess, unspecified: Secondary | ICD-10-CM | POA: Diagnosis not present

## 2020-02-20 DIAGNOSIS — I700 Atherosclerosis of aorta: Secondary | ICD-10-CM | POA: Diagnosis not present

## 2020-02-20 NOTE — Patient Outreach (Signed)
[place] [facility]) Care Management  02/20/2020  Maxen [place] [facility] 1945-12-31 [id number]   RED ON EMMI ALERT- General Discharge Day #4 Date:2/25 Red Alert Reason:Unsure if he has discharge instructions  Call received back from member after missed call yesterday, member's identity verified.  This care manager introduced self and stated purpose of call, [facility] care management services explained.  He report he does have and understand the discharge instructions.  State his daughter and wife have been helping manage his care, including administering his IV medications.  He confirms that he does have home health, state they are visiting "too much."  Daughter report he is getting visits from RN, PT, and OT, all twice a week each.  He feels he is overwhelmed with all of the visits, advised of importance in recovery.  He verbalizes understanding.  Report he is independent in ADL/IADL's but depend on daughter/wife when needed.  They transport him to MD appointments, report he has been in contact with PCP since discharge, has appointment scheduled with ID on 3/22.   State he is concerned about receiving his hospital bill as he had his wife are both disabled and on a fixed income.  Report he was told a couple months ago to apply for Medicaid but he never pursued it.  He does agree to follow up with Education officer, museum for [employer] and assistance with application.   Denies any urgent concerns at this time, will follow up within the next 2 weeks.  Fall Risk  02/20/2020 05/01/2015  Falls in the past year? 1 No  Number falls in past yr: 1 -  Injury with Fall? 1 -  Risk for fall due to : History of fall(s);Impaired balance/gait -  Follow up Falls prevention discussed -   Depression screen [facility] 2/9 02/20/2020 05/01/2015  Decreased Interest 0 0  Down, Depressed, Hopeless 0 0  PHQ - 2 Score 0 0   THN CM Care Plan Problem One     Most Recent Value  Care Plan Problem One  Risk for readmission  related to infection as evidenced by recent hospitalization  Role Documenting the Problem One  Care Management [place] for Problem One  Active  [facility] Long Term Goal   Member will not be readmitted to hospital within the next 31 days  THN Long Term Goal Start Date  02/20/20  Interventions for Problem One Long Term Goal  Discharge instructions reviewed with member and daughter.  Reviewed plan of care (diet, MD appointments, and home health involvement).  educated on importance of following in effort to decrease risk of readmission  THN CM Short Term Goal #1   Member will report taking medications as prescribed over the next 3 weeks  THN CM Short Term Goal #1 Start Date  02/20/20  Interventions for Short Term Goal #1  Medications reviewed with member, including administration of IV antibiotics  THN CM Short Term Goal #2   Member will not show signs of infection over the next 2 weeks  THN CM Short Term Goal #2 Start Date  02/20/20  Interventions for Short Term Goal #2  Member and daughter educated on signs/symptoms of infection and when to contact member     Valente David, Therapist, sports, MSN [place] Manager [phone]

## 2020-02-24 DIAGNOSIS — G8929 Other chronic pain: Secondary | ICD-10-CM | POA: Diagnosis not present

## 2020-02-24 DIAGNOSIS — Z452 Encounter for adjustment and management of vascular access device: Secondary | ICD-10-CM | POA: Diagnosis not present

## 2020-02-24 DIAGNOSIS — I700 Atherosclerosis of aorta: Secondary | ICD-10-CM | POA: Diagnosis not present

## 2020-02-24 DIAGNOSIS — M1711 Unilateral primary osteoarthritis, right knee: Secondary | ICD-10-CM | POA: Diagnosis not present

## 2020-02-24 DIAGNOSIS — G20 Parkinson's disease: Secondary | ICD-10-CM | POA: Diagnosis not present

## 2020-02-24 DIAGNOSIS — T8149XA Infection following a procedure, other surgical site, initial encounter: Secondary | ICD-10-CM | POA: Diagnosis not present

## 2020-02-24 DIAGNOSIS — M4804 Spinal stenosis, thoracic region: Secondary | ICD-10-CM | POA: Diagnosis not present

## 2020-02-24 DIAGNOSIS — Z9181 History of falling: Secondary | ICD-10-CM | POA: Diagnosis not present

## 2020-02-24 DIAGNOSIS — I2510 Atherosclerotic heart disease of native coronary artery without angina pectoris: Secondary | ICD-10-CM | POA: Diagnosis not present

## 2020-02-24 DIAGNOSIS — J439 Emphysema, unspecified: Secondary | ICD-10-CM | POA: Diagnosis not present

## 2020-02-24 DIAGNOSIS — G061 Intraspinal abscess and granuloma: Secondary | ICD-10-CM | POA: Diagnosis not present

## 2020-02-24 DIAGNOSIS — Z466 Encounter for fitting and adjustment of urinary device: Secondary | ICD-10-CM | POA: Diagnosis not present

## 2020-02-24 DIAGNOSIS — Z792 Long term (current) use of antibiotics: Secondary | ICD-10-CM | POA: Diagnosis not present

## 2020-02-24 DIAGNOSIS — B9561 Methicillin susceptible Staphylococcus aureus infection as the cause of diseases classified elsewhere: Secondary | ICD-10-CM | POA: Diagnosis not present

## 2020-02-24 DIAGNOSIS — M7520 Bicipital tendinitis, unspecified shoulder: Secondary | ICD-10-CM | POA: Diagnosis not present

## 2020-02-25 ENCOUNTER — Ambulatory Visit: Payer: Self-pay | Admitting: *Deleted

## 2020-02-25 DIAGNOSIS — Z466 Encounter for fitting and adjustment of urinary device: Secondary | ICD-10-CM | POA: Diagnosis not present

## 2020-02-25 DIAGNOSIS — I700 Atherosclerosis of aorta: Secondary | ICD-10-CM | POA: Diagnosis not present

## 2020-02-25 DIAGNOSIS — Z792 Long term (current) use of antibiotics: Secondary | ICD-10-CM | POA: Diagnosis not present

## 2020-02-25 DIAGNOSIS — M1711 Unilateral primary osteoarthritis, right knee: Secondary | ICD-10-CM | POA: Diagnosis not present

## 2020-02-25 DIAGNOSIS — B9561 Methicillin susceptible Staphylococcus aureus infection as the cause of diseases classified elsewhere: Secondary | ICD-10-CM | POA: Diagnosis not present

## 2020-02-25 DIAGNOSIS — Z452 Encounter for adjustment and management of vascular access device: Secondary | ICD-10-CM | POA: Diagnosis not present

## 2020-02-25 DIAGNOSIS — M4804 Spinal stenosis, thoracic region: Secondary | ICD-10-CM | POA: Diagnosis not present

## 2020-02-25 DIAGNOSIS — Z9181 History of falling: Secondary | ICD-10-CM | POA: Diagnosis not present

## 2020-02-25 DIAGNOSIS — T8149XA Infection following a procedure, other surgical site, initial encounter: Secondary | ICD-10-CM | POA: Diagnosis not present

## 2020-02-25 DIAGNOSIS — G8929 Other chronic pain: Secondary | ICD-10-CM | POA: Diagnosis not present

## 2020-02-25 DIAGNOSIS — G061 Intraspinal abscess and granuloma: Secondary | ICD-10-CM | POA: Diagnosis not present

## 2020-02-25 DIAGNOSIS — J439 Emphysema, unspecified: Secondary | ICD-10-CM | POA: Diagnosis not present

## 2020-02-25 DIAGNOSIS — M7520 Bicipital tendinitis, unspecified shoulder: Secondary | ICD-10-CM | POA: Diagnosis not present

## 2020-02-25 DIAGNOSIS — G20 Parkinson's disease: Secondary | ICD-10-CM | POA: Diagnosis not present

## 2020-02-25 DIAGNOSIS — I2510 Atherosclerotic heart disease of native coronary artery without angina pectoris: Secondary | ICD-10-CM | POA: Diagnosis not present

## 2020-02-27 ENCOUNTER — Other Ambulatory Visit: Payer: Self-pay | Admitting: *Deleted

## 2020-02-27 ENCOUNTER — Encounter: Payer: Self-pay | Admitting: *Deleted

## 2020-02-27 DIAGNOSIS — M1711 Unilateral primary osteoarthritis, right knee: Secondary | ICD-10-CM | POA: Diagnosis not present

## 2020-02-27 DIAGNOSIS — B9561 Methicillin susceptible Staphylococcus aureus infection as the cause of diseases classified elsewhere: Secondary | ICD-10-CM | POA: Diagnosis not present

## 2020-02-27 DIAGNOSIS — Z466 Encounter for fitting and adjustment of urinary device: Secondary | ICD-10-CM | POA: Diagnosis not present

## 2020-02-27 DIAGNOSIS — G062 Extradural and subdural abscess, unspecified: Secondary | ICD-10-CM | POA: Diagnosis not present

## 2020-02-27 DIAGNOSIS — M7520 Bicipital tendinitis, unspecified shoulder: Secondary | ICD-10-CM | POA: Diagnosis not present

## 2020-02-27 DIAGNOSIS — G8929 Other chronic pain: Secondary | ICD-10-CM | POA: Diagnosis not present

## 2020-02-27 DIAGNOSIS — J439 Emphysema, unspecified: Secondary | ICD-10-CM | POA: Diagnosis not present

## 2020-02-27 DIAGNOSIS — R7881 Bacteremia: Secondary | ICD-10-CM | POA: Diagnosis not present

## 2020-02-27 DIAGNOSIS — Z792 Long term (current) use of antibiotics: Secondary | ICD-10-CM | POA: Diagnosis not present

## 2020-02-27 DIAGNOSIS — I10 Essential (primary) hypertension: Secondary | ICD-10-CM | POA: Diagnosis not present

## 2020-02-27 DIAGNOSIS — Z452 Encounter for adjustment and management of vascular access device: Secondary | ICD-10-CM | POA: Diagnosis not present

## 2020-02-27 DIAGNOSIS — T8149XA Infection following a procedure, other surgical site, initial encounter: Secondary | ICD-10-CM | POA: Diagnosis not present

## 2020-02-27 DIAGNOSIS — Z9181 History of falling: Secondary | ICD-10-CM | POA: Diagnosis not present

## 2020-02-27 DIAGNOSIS — I2510 Atherosclerotic heart disease of native coronary artery without angina pectoris: Secondary | ICD-10-CM | POA: Diagnosis not present

## 2020-02-27 DIAGNOSIS — R8290 Unspecified abnormal findings in urine: Secondary | ICD-10-CM | POA: Diagnosis not present

## 2020-02-27 DIAGNOSIS — E876 Hypokalemia: Secondary | ICD-10-CM | POA: Diagnosis not present

## 2020-02-27 DIAGNOSIS — A4901 Methicillin susceptible Staphylococcus aureus infection, unspecified site: Secondary | ICD-10-CM | POA: Diagnosis not present

## 2020-02-27 DIAGNOSIS — M4804 Spinal stenosis, thoracic region: Secondary | ICD-10-CM | POA: Diagnosis not present

## 2020-02-27 DIAGNOSIS — I700 Atherosclerosis of aorta: Secondary | ICD-10-CM | POA: Diagnosis not present

## 2020-02-27 DIAGNOSIS — G061 Intraspinal abscess and granuloma: Secondary | ICD-10-CM | POA: Diagnosis not present

## 2020-02-27 DIAGNOSIS — R600 Localized edema: Secondary | ICD-10-CM | POA: Diagnosis not present

## 2020-02-27 DIAGNOSIS — E559 Vitamin D deficiency, unspecified: Secondary | ICD-10-CM | POA: Diagnosis not present

## 2020-02-27 DIAGNOSIS — G20 Parkinson's disease: Secondary | ICD-10-CM | POA: Diagnosis not present

## 2020-02-27 NOTE — Patient Outreach (Signed)
[place] [facility]) Care Management  02/27/2020  Kent Flowers [facility] Jul 09, 1946 [id number]   CSW made an initial attempt to try and contact patient today to perform the initial phone assessment, as well as assess and assist with social work needs and services, without success.  A HIPAA compliant message was left for patient on voicemail.  CSW is currently awaiting a return call.  CSW will make a second outreach attempt within the next 3-4 business days, if a return call is not received from patient in the meantime.  CSW will also mail an Outreach Letter to patient's home requesting that patient contact CSW if patient is interested in receiving social work services through [place] with Scientist, clinical (histocompatibility and immunogenetics).  Nat Christen, BSW, MSW, LCSW  Licensed Education officer, environmental Health System  Mailing [place] [address], [place], [place] [address] Physical Address[address] [place], [place], [place] [address] Toll Free Main # [phone] Fax # [phone] Cell # [phone]  Office # [phone] [name]

## 2020-03-01 ENCOUNTER — Other Ambulatory Visit: Payer: Self-pay

## 2020-03-01 ENCOUNTER — Ambulatory Visit: Payer: Self-pay | Admitting: *Deleted

## 2020-03-01 DIAGNOSIS — M1711 Unilateral primary osteoarthritis, right knee: Secondary | ICD-10-CM | POA: Diagnosis not present

## 2020-03-01 DIAGNOSIS — N319 Neuromuscular dysfunction of bladder, unspecified: Secondary | ICD-10-CM | POA: Diagnosis not present

## 2020-03-01 DIAGNOSIS — M4804 Spinal stenosis, thoracic region: Secondary | ICD-10-CM | POA: Diagnosis not present

## 2020-03-01 DIAGNOSIS — Z9181 History of falling: Secondary | ICD-10-CM | POA: Diagnosis not present

## 2020-03-01 DIAGNOSIS — G20 Parkinson's disease: Secondary | ICD-10-CM | POA: Diagnosis not present

## 2020-03-01 DIAGNOSIS — Z452 Encounter for adjustment and management of vascular access device: Secondary | ICD-10-CM | POA: Diagnosis not present

## 2020-03-01 DIAGNOSIS — J439 Emphysema, unspecified: Secondary | ICD-10-CM | POA: Diagnosis not present

## 2020-03-01 DIAGNOSIS — B9561 Methicillin susceptible Staphylococcus aureus infection as the cause of diseases classified elsewhere: Secondary | ICD-10-CM | POA: Diagnosis not present

## 2020-03-01 DIAGNOSIS — Z466 Encounter for fitting and adjustment of urinary device: Secondary | ICD-10-CM | POA: Diagnosis not present

## 2020-03-01 DIAGNOSIS — I700 Atherosclerosis of aorta: Secondary | ICD-10-CM | POA: Diagnosis not present

## 2020-03-01 DIAGNOSIS — Z792 Long term (current) use of antibiotics: Secondary | ICD-10-CM | POA: Diagnosis not present

## 2020-03-01 DIAGNOSIS — G061 Intraspinal abscess and granuloma: Secondary | ICD-10-CM | POA: Diagnosis not present

## 2020-03-01 DIAGNOSIS — M7520 Bicipital tendinitis, unspecified shoulder: Secondary | ICD-10-CM | POA: Diagnosis not present

## 2020-03-01 DIAGNOSIS — T8149XA Infection following a procedure, other surgical site, initial encounter: Secondary | ICD-10-CM | POA: Diagnosis not present

## 2020-03-01 DIAGNOSIS — G8929 Other chronic pain: Secondary | ICD-10-CM | POA: Diagnosis not present

## 2020-03-01 DIAGNOSIS — I2510 Atherosclerotic heart disease of native coronary artery without angina pectoris: Secondary | ICD-10-CM | POA: Diagnosis not present

## 2020-03-01 NOTE — Patient Outreach (Signed)
[facility]) Care Management  03/01/2020  [place] [facility] Feb 06, 1946 [id number]   Medication Adherence call to Mr. Kent Flowers HIPPA Compliant Voice message left with a call back number. Mr. Kent Flowers is showing past due on Enalepril 2.5 mg under [place].   [place] Management Direct Dial [phone]  Fax [phone] [email or web address]

## 2020-03-02 ENCOUNTER — Encounter: Payer: Self-pay | Admitting: *Deleted

## 2020-03-02 ENCOUNTER — Other Ambulatory Visit: Payer: Self-pay | Admitting: *Deleted

## 2020-03-02 DIAGNOSIS — N319 Neuromuscular dysfunction of bladder, unspecified: Secondary | ICD-10-CM | POA: Diagnosis not present

## 2020-03-02 DIAGNOSIS — R338 Other retention of urine: Secondary | ICD-10-CM | POA: Diagnosis not present

## 2020-03-02 DIAGNOSIS — R310 Gross hematuria: Secondary | ICD-10-CM | POA: Diagnosis not present

## 2020-03-02 NOTE — Patient Outreach (Signed)
[name]) Care Management  03/02/2020  Kent Flowers [facility] Sep 09, 1946 [id number]  CSW was able to make initial contact with patient today to perform the phone assessment, as well as assess and assist with social work needs and services, when patient returned CSW's call from last week.  CSW introduced self, explained role and types of services provided through [place] Management ([place] Management).  CSW further explained to patient that CSW works with patient's RNCM, also with [place] Management, Valente David.  CSW then explained the reason for the call, indicating that Kent Flowers thought that patient would benefit from social work services and resources to assist with providing patient with a list of community agencies that may be able to offer financial assistance, as well as assist patient with completion of a Medicaid application.  CSW obtained two HIPAA compliant identifiers from patient, which included patient's name and date of birth.  Patient admitted that it is difficult for he and his wife, Kent Flowers to make ends meet each month, wanting CSW to provide him with a list of community agencies and resources that may be able to offer financial assistance.  Patient went on to say that he recently spoke with a representative from the [place] regarding applying for Medicaid, but was discouraged, having been told that he and Kent Flowers's combined Social Security Income exceeds the poverty guidelines for the [place].  Patient encouraged CSW to mail him an application anyway, as he still wishes to apply.  CSW agreed to mail patient the following resource information and application: [employer] of Health and Estate agent for [employer]; [employer]; [employer] for Seniors.  CSW then agreed to follow-up with patient again at the end of next week, on Friday,  March 12, 2020, around 2:30pm, to ensure that he received the packet of resource information, as well as to answer any questions that he may have pertaining to the information received.  Patient voiced understanding and was agreeable to this plan, indicating that he may also need assistance with completing the Medicaid application.  No additional social work needs have been identified at this time.  However, CSW was able to confirm that patient has the correct contact information for CSW, encouraging patient to contact CSW directly if additional social work needs arise in the near future.  Nat Christen, BSW, MSW, LCSW  Licensed Education officer, environmental Health System  Mailing [place] [address] [place], [place] [address] Physical Address-[address], [place],  [place] [address] Toll Free Main # [phone] Fax # [phone] Cell # [phone]  Office # [phone] [name]

## 2020-03-03 ENCOUNTER — Other Ambulatory Visit: Payer: Self-pay | Admitting: *Deleted

## 2020-03-03 ENCOUNTER — Encounter (HOSPITAL_COMMUNITY): Payer: Self-pay | Admitting: Emergency Medicine

## 2020-03-03 ENCOUNTER — Other Ambulatory Visit: Payer: Self-pay

## 2020-03-03 DIAGNOSIS — E559 Vitamin D deficiency, unspecified: Secondary | ICD-10-CM | POA: Diagnosis not present

## 2020-03-03 DIAGNOSIS — Z87891 Personal history of nicotine dependence: Secondary | ICD-10-CM | POA: Diagnosis not present

## 2020-03-03 DIAGNOSIS — N401 Enlarged prostate with lower urinary tract symptoms: Secondary | ICD-10-CM | POA: Insufficient documentation

## 2020-03-03 DIAGNOSIS — T83098A Other mechanical complication of other indwelling urethral catheter, initial encounter: Secondary | ICD-10-CM | POA: Diagnosis not present

## 2020-03-03 DIAGNOSIS — Z79899 Other long term (current) drug therapy: Secondary | ICD-10-CM | POA: Diagnosis not present

## 2020-03-03 DIAGNOSIS — G894 Chronic pain syndrome: Secondary | ICD-10-CM | POA: Diagnosis not present

## 2020-03-03 DIAGNOSIS — I2510 Atherosclerotic heart disease of native coronary artery without angina pectoris: Secondary | ICD-10-CM | POA: Diagnosis not present

## 2020-03-03 DIAGNOSIS — Y732 Prosthetic and other implants, materials and accessory gastroenterology and urology devices associated with adverse incidents: Secondary | ICD-10-CM | POA: Diagnosis not present

## 2020-03-03 DIAGNOSIS — T839XXA Unspecified complication of genitourinary prosthetic device, implant and graft, initial encounter: Secondary | ICD-10-CM | POA: Diagnosis present

## 2020-03-03 DIAGNOSIS — J449 Chronic obstructive pulmonary disease, unspecified: Secondary | ICD-10-CM | POA: Insufficient documentation

## 2020-03-03 NOTE — ED Notes (Signed)
Patient called for triage x1 with no answer.

## 2020-03-03 NOTE — Patient Outreach (Signed)
[facility]) Care Management  03/03/2020  Camerron Garnier [facility] Apr 23, 1946 [id number]   Call placed to member to follow up on recovery from admission and infections.  State he is still having low grade fever off and on but denies any high fever or chills.  Denies any confusion.  State he has continued to take his antibiotics and working with home health for PT/OT, has visit scheduled for this morning so unable to talk long.  He is reminded of appointment with ID on Monday, 3/22, he was not aware stating his wife usually keep all of those details.  He does write down appointment specifics and confirms his wife or daughter will take him.  Denies any urgent concerns at this time, will follow up within the next month.  [facility] CM Care Plan Problem One     Most Recent Value  Care Plan Problem One  Risk for readmission related to infection as evidenced by recent hospitalization  Role Documenting the Problem One  Care Management [place] for Problem One  Active  [facility] Long Term Goal   Member will not be readmitted to hospital within the next 31 days  [facility] Long Term Goal Start Date  02/20/20  Interventions for Problem One Long Term Goal  Discussed progression of plan of care with member, encouraged to contiue with therapy sessions.  Reminded of follow up appointment with ID, provided with appointment details  [facility] CM Short Term Goal #1   Member will report taking medications as prescribed over the next 3 weeks  [facility] CM Short Term Goal #1 Start Date  02/20/20  Interventions for Short Term Goal #1  Reviewed use of antibiotic therapy with member, discussed safe use of PICC line in effort to decrease infection  [facility] CM Short Term Goal #2   Member will not show signs of infection over the next 2 weeks  [facility] CM Short Term Goal #2 Start Date  02/20/20  Interventions for Short Term Goal #2  Encouraged to continue to monitor temperature daily as increased temp is a sign of infection     Valente David, Therapist, sports, MSN [place] Manager [phone]

## 2020-03-03 NOTE — ED Triage Notes (Signed)
Patient reports he was unable to drain foley bag today so he cut it. Patient is in need of a new foley leg bag.

## 2020-03-04 ENCOUNTER — Ambulatory Visit: Payer: Self-pay | Admitting: *Deleted

## 2020-03-04 ENCOUNTER — Emergency Department (HOSPITAL_COMMUNITY)
Admission: EM | Admit: 2020-03-04 | Discharge: 2020-03-04 | Disposition: A | Discharge status: Home / Self Care | Payer: Medicare Other | Attending: Emergency Medicine | Admitting: Emergency Medicine

## 2020-03-04 DIAGNOSIS — T839XXA Unspecified complication of genitourinary prosthetic device, implant and graft, initial encounter: Secondary | ICD-10-CM

## 2020-03-04 DIAGNOSIS — Z466 Encounter for fitting and adjustment of urinary device: Secondary | ICD-10-CM | POA: Diagnosis not present

## 2020-03-04 DIAGNOSIS — J439 Emphysema, unspecified: Secondary | ICD-10-CM | POA: Diagnosis not present

## 2020-03-04 DIAGNOSIS — M4804 Spinal stenosis, thoracic region: Secondary | ICD-10-CM | POA: Diagnosis not present

## 2020-03-04 DIAGNOSIS — B9561 Methicillin susceptible Staphylococcus aureus infection as the cause of diseases classified elsewhere: Secondary | ICD-10-CM | POA: Diagnosis not present

## 2020-03-04 DIAGNOSIS — Z792 Long term (current) use of antibiotics: Secondary | ICD-10-CM | POA: Diagnosis not present

## 2020-03-04 DIAGNOSIS — Z9181 History of falling: Secondary | ICD-10-CM | POA: Diagnosis not present

## 2020-03-04 DIAGNOSIS — G20 Parkinson's disease: Secondary | ICD-10-CM | POA: Diagnosis not present

## 2020-03-04 DIAGNOSIS — I2510 Atherosclerotic heart disease of native coronary artery without angina pectoris: Secondary | ICD-10-CM | POA: Diagnosis not present

## 2020-03-04 DIAGNOSIS — M1711 Unilateral primary osteoarthritis, right knee: Secondary | ICD-10-CM | POA: Diagnosis not present

## 2020-03-04 DIAGNOSIS — I700 Atherosclerosis of aorta: Secondary | ICD-10-CM | POA: Diagnosis not present

## 2020-03-04 DIAGNOSIS — G061 Intraspinal abscess and granuloma: Secondary | ICD-10-CM | POA: Diagnosis not present

## 2020-03-04 DIAGNOSIS — Z452 Encounter for adjustment and management of vascular access device: Secondary | ICD-10-CM | POA: Diagnosis not present

## 2020-03-04 DIAGNOSIS — M7520 Bicipital tendinitis, unspecified shoulder: Secondary | ICD-10-CM | POA: Diagnosis not present

## 2020-03-04 DIAGNOSIS — G8929 Other chronic pain: Secondary | ICD-10-CM | POA: Diagnosis not present

## 2020-03-04 DIAGNOSIS — T8149XA Infection following a procedure, other surgical site, initial encounter: Secondary | ICD-10-CM | POA: Diagnosis not present

## 2020-03-04 NOTE — ED Provider Notes (Signed)
Kent Flowers Provider Note   CSN: [id number] Arrival date & time: 03/03/20  2225     History No chief complaint on file.   Kent Flowers is a 74 y.o. male.  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  Patient states that blood clots were getting into his bag and preventing him from being able to drain it.  He cut the bag with a knife, and presented to the ED to get a new one.  Patient endorses pressure and mild tenderness in his chest testicular region.  The patient denies having any recent fevers, chest pain, SOB, abdominal pain, or nausea or vomiting.       Past Medical History:  Diagnosis Date  . Benign localized prostatic hyperplasia with lower urinary tract symptoms (LUTS)   . Chronic low back pain   . COPD (chronic obstructive pulmonary disease) with emphysema (HCC)    PULMOLOGIST-- DR Tarri Fuller YOUNG  . 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 ED (erectile dysfunction)   . Full dentures   . Gross hematuria   . 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 Hypogonadism male   . Knee pain, bilateral    INTERMITTENT--  MENISCUS  . OA (osteoarthritis)    KNEES  . Peyronie's disease   . Pulmonary nodules followed by dr c. young (pulmologist)   LLL and LUL-- per last CT 10-01-2013  stable and previous right lung nodule not seen  . Thoracic ascending aortic aneurysm (HCC)    STABLE PER LAST CT 10-01-2013  4CM--  ECTATIC   . Urethral stricture   . Urgency of urination   . 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 Wears glasses     Patient Active Problem List   Diagnosis Date Noted  . Epidural abscess   . Sepsis ([place]) 02/04/2020  . Leg swelling  02/04/2020  . Vertebral osteophyte 02/04/2020  . 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 Pulmonary mass 01/22/2020  . PICC (peripherally inserted central catheter) in place 01/22/2020  . Paresthesia of right upper extremity 12/18/2019  . 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 Status post laminectomy 12/18/2019  . Insomnia 09/22/2019  . Neck pain 11/04/2017  . Other social stressor 07/19/2016  . COPD with acute bronchitis ([place]) 10/04/2014  . Hepatitis, unspecified 03/13/2014  . Chronic low back pain   . Knee pain, bilateral   . Right knee DJD   . Hepatitis 11/03/2013  . 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 Aortic calcification ([place]) 11/03/2013  . Coronary atherosclerosis of native coronary artery 11/03/2013  . Penile neoplasm   . Ascending aortic aneurysm ([place])   . Lung nodule 05/23/2012  . COPD mixed type ([place]) 02/24/2012    Past Surgical History:  Procedure Laterality Date  . CYSTOSCOPY WITH BIOPSY N/A 04/10/2017   Procedure: POSSIBLE BLADDER  BIOPSY;  Surgeon: Festus Aloe, MD;  Location: [facility];  Service: Urology;  Laterality: N/A;  . CYSTOSCOPY WITH URETHRAL DILATATION N/A 04/10/2017   Procedure: CYSTOSCOPY WITH BALLOON URETHRALSTRICTURE DILATATION;  Surgeon: Festus Aloe, MD;  Location[facility]  Service: Urology;  Laterality: N/A;  . PENILE BIOPSY N/A 03/14/2013  Procedure: PENILE BIOPSY;  Surgeon: Fredricka Bonine, MD;  Location: [facility];  Service: Urology;  Laterality: N/A;  . REATTACHMENT LEFT INDEX FINGER  1988  . SHOULDER ARTHROSCOPY WITH ROTATOR CUFF REPAIR AND SUBACROMIAL DECOMPRESSION Left 2004  . THORACIC LAMINECTOMY FOR EPIDURAL ABSCESS N/A 12/18/2019   Procedure: THORACIC LAMINECTOMY FOR EPIDURAL ABSCESS Thoracic Two, Thoracic Three;  Surgeon: Kent Part, MD;  Location: [place];  Service: Neurosurgery;  Laterality: N/A;  THORACIC LAMINECTOMY FOR EPIDURAL  ABSCESS Thoracic Two, Thoracic Three  . TONSILLECTOMY  AS CHILD     Family History  Problem Relation Age of Onset  . Emphysema Mother   . Cancer Father        bile duct   Social History   Tobacco Use  . Smoking status: Former Smoker    Packs/day: 1.00    Years: 12.00    Pack years: 12.00    Types: Cigarettes    Quit date: 12/18/2009    Years since quitting: 10.2  . Smokeless tobacco: Never Used  Substance Use Topics  . Alcohol use: Yes    Alcohol/week: 3.0 standard drinks    Types: 3 Glasses of wine per week  . Drug use: No   Home Medications Prior to Admission medications   Medication Sig Start Date End Date Taking? Authorizing Provider  albuterol (PROAIR HFA) 108 (90 Base) MCG/ACT inhaler INHALE 2 PUFFS INTO THE LUNGS EVERY 6 HOURS AS NEEDED FOR WHEEZING ORSHORTNESS OF BREATH Patient taking differently: Inhale 2 puffs into the lungs every 6 (six) hours as needed for wheezing or shortness of breath.  09/22/19   Baird Lyons D, MD  feeding supplement, ENSURE ENLIVE, (ENSURE ENLIVE) LIQD Take 237 mLs by mouth 2 (two) times daily between meals. 02/06/20 04/06/20  Mercy Riding, MD  ferrous sulfate 325 (65 FE) MG EC tablet Take 1 tablet (325 mg total) by mouth 2 (two) times daily. 02/06/20 08/04/20  Mercy Riding, MD  folic acid (FOLVITE) 1 MG tablet Take 1 tablet (1 mg total) by mouth daily. 12/26/19   Earlene Plater, MD  gabapentin (NEURONTIN) 300 MG capsule Take 1 capsule (300 mg total) by mouth 3 (three) times daily. 12/25/19   Earlene Plater, MD  magnesium gluconate (MAGONATE) 500 MG tablet Take 500 mg by mouth daily.    [provider]  Melatonin 5 MG SUBL Place 5 mg under the tongue at bedtime. 01/15/20   [provider]  Multiple Vitamin (MULTIVITAMIN WITH MINERALS) TABS tablet Take 1 tablet by mouth daily. 02/07/20   Mercy Riding, MD  nafcillin IVPB Inject 12 g into the vein daily. As a continuous infusion. Indication:  Discitis/osteomyelitis Last Day of  Therapy:  04/01/2020 Labs - Once weekly:  CBC/D and BMP, Labs - Every other week:  ESR and CRP 02/06/20 04/01/20  Mercy Riding, MD  OVER THE COUNTER MEDICATION Take 1 tablet by mouth daily. QC one supplement    [provider]  oxyCODONE 10 MG TABS Take 1 tablet (10 mg total) by mouth every 4 (four) hours while awake. Patient taking differently: Take 10-15 mg by mouth every 4 (four) hours while awake.  12/25/19   Earlene Plater, MD  pantoprazole (PROTONIX) 40 MG tablet Take 1 tablet (40 mg total) by mouth daily at 12 noon. 12/25/19   Earlene Plater, MD  senna-docusate (SENOKOT-S) 8.6-50 MG tablet Take 1 tablet by mouth at bedtime as needed for mild constipation. 02/06/20   Mercy Riding, MD  testosterone cypionate (DEPOTESTOSTERONE CYPIONATE) 200 MG/ML injection Inject 180 mg into the muscle every 14 (fourteen) days.  09/17/19   [provider]  thiamine 100 MG tablet Take 1 tablet (100 mg total) by mouth daily. 12/26/19   Earlene Plater, MD  vitamin B-12 1000 MCG tablet Take 1 tablet (1,000 mcg total) by mouth daily. 02/07/20   Mercy Riding, MD    Allergies    Ceclor [cefaclor] and Sulfa antibiotics  Review of Systems   Review of Systems  All other systems reviewed and are negative.  Physical Exam Updated Vital Signs BP (!) 160/106   Pulse (!) 102   Temp 98.2 F (36.8 C)   Resp 17   SpO2 98%   Physical Exam Vitals reviewed.  Constitutional:      General: He is not in acute distress.    Appearance: Normal appearance. He is normal weight. He is ill-appearing. He is not toxic-appearing.  HENT:     Head: Normocephalic and atraumatic.  Eyes:     Extraocular Movements: Extraocular movements intact.  Cardiovascular:     Rate and Rhythm: Regular rhythm. Tachycardia present.     Pulses: Normal pulses.     Heart sounds: Normal heart sounds. No murmur. No friction rub. No gallop.   Pulmonary:     Effort: Pulmonary effort is normal. No respiratory distress.      Breath sounds: Wheezing (With prolonged expiration) present. No rales.  Abdominal:     General: Abdomen is flat. Bowel sounds are normal. There is no distension.     Palpations: Abdomen is soft.     Tenderness: There is no abdominal tenderness. There is no guarding.  Genitourinary:    Penis: Normal.      Testes: Normal.     Comments: Foley in place and is draining frank red blood. Musculoskeletal:     Right lower leg: No edema.     Left lower leg: No edema.  Skin:    General: Skin is warm.  Neurological:     Mental Status: He is alert and oriented to person, place, and time. Mental status is at baseline.  Psychiatric:        Mood and Affect: Mood normal.    ED Results / Procedures / Treatments   Labs (all labs ordered are listed, but only abnormal results are displayed) Labs Reviewed - No data to display  EKG None  Radiology No results found.  Procedures Procedures (including critical care time)  Medications Ordered in ED Medications - No data to display  ED Course  I have reviewed the triage vital signs and the nursing notes.  Pertinent labs & imaging results that were available during my care of the patient were reviewed by me and considered in my medical decision making (see chart for details).    MDM Rules/Calculators/A&P                      Patient presents after cutting his indwelling Foley bag that was placed by his urologist earlier this week.  Patient is currently on IV nafcillin for ongoing treatment for a thoracic epidural abscess complicated by osteomyelitis.  Vital signs are stable.  We will plan to irrigate his current Foley in attached new bag.  Will likely discharge afterwards and have the patient follow-up with urology.  Final Clinical Impression(s) / ED Diagnoses Final diagnoses:  None    Rx / DC Orders ED Discharge Orders    None  Earlene Plater, MD 03/04/20 0118    Shanon Rosser, MD 03/04/20 [id number]

## 2020-03-05 DIAGNOSIS — M7520 Bicipital tendinitis, unspecified shoulder: Secondary | ICD-10-CM | POA: Diagnosis not present

## 2020-03-05 DIAGNOSIS — A4901 Methicillin susceptible Staphylococcus aureus infection, unspecified site: Secondary | ICD-10-CM | POA: Diagnosis not present

## 2020-03-05 DIAGNOSIS — I2510 Atherosclerotic heart disease of native coronary artery without angina pectoris: Secondary | ICD-10-CM | POA: Diagnosis not present

## 2020-03-05 DIAGNOSIS — T8149XA Infection following a procedure, other surgical site, initial encounter: Secondary | ICD-10-CM | POA: Diagnosis not present

## 2020-03-05 DIAGNOSIS — J439 Emphysema, unspecified: Secondary | ICD-10-CM | POA: Diagnosis not present

## 2020-03-05 DIAGNOSIS — Z452 Encounter for adjustment and management of vascular access device: Secondary | ICD-10-CM | POA: Diagnosis not present

## 2020-03-05 DIAGNOSIS — G062 Extradural and subdural abscess, unspecified: Secondary | ICD-10-CM | POA: Diagnosis not present

## 2020-03-05 DIAGNOSIS — Z9181 History of falling: Secondary | ICD-10-CM | POA: Diagnosis not present

## 2020-03-05 DIAGNOSIS — R7881 Bacteremia: Secondary | ICD-10-CM | POA: Diagnosis not present

## 2020-03-05 DIAGNOSIS — M1711 Unilateral primary osteoarthritis, right knee: Secondary | ICD-10-CM | POA: Diagnosis not present

## 2020-03-05 DIAGNOSIS — M4804 Spinal stenosis, thoracic region: Secondary | ICD-10-CM | POA: Diagnosis not present

## 2020-03-05 DIAGNOSIS — G20 Parkinson's disease: Secondary | ICD-10-CM | POA: Diagnosis not present

## 2020-03-05 DIAGNOSIS — I700 Atherosclerosis of aorta: Secondary | ICD-10-CM | POA: Diagnosis not present

## 2020-03-05 DIAGNOSIS — Z792 Long term (current) use of antibiotics: Secondary | ICD-10-CM | POA: Diagnosis not present

## 2020-03-05 DIAGNOSIS — G061 Intraspinal abscess and granuloma: Secondary | ICD-10-CM | POA: Diagnosis not present

## 2020-03-05 DIAGNOSIS — G8929 Other chronic pain: Secondary | ICD-10-CM | POA: Diagnosis not present

## 2020-03-05 DIAGNOSIS — Z466 Encounter for fitting and adjustment of urinary device: Secondary | ICD-10-CM | POA: Diagnosis not present

## 2020-03-05 DIAGNOSIS — B9561 Methicillin susceptible Staphylococcus aureus infection as the cause of diseases classified elsewhere: Secondary | ICD-10-CM | POA: Diagnosis not present

## 2020-03-08 ENCOUNTER — Encounter: Payer: Self-pay | Admitting: Infectious Disease

## 2020-03-08 ENCOUNTER — Ambulatory Visit: Payer: Medicare Other | Admitting: Infectious Disease

## 2020-03-08 ENCOUNTER — Other Ambulatory Visit: Payer: Self-pay

## 2020-03-08 VITALS — BP 150/67 | HR 77 | Temp 98.3°F | Wt 165.0 lb

## 2020-03-08 DIAGNOSIS — G062 Extradural and subdural abscess, unspecified: Secondary | ICD-10-CM | POA: Diagnosis not present

## 2020-03-08 DIAGNOSIS — I2510 Atherosclerotic heart disease of native coronary artery without angina pectoris: Secondary | ICD-10-CM

## 2020-03-08 DIAGNOSIS — J449 Chronic obstructive pulmonary disease, unspecified: Secondary | ICD-10-CM

## 2020-03-08 DIAGNOSIS — M4620 Osteomyelitis of vertebra, site unspecified: Secondary | ICD-10-CM

## 2020-03-08 DIAGNOSIS — G061 Intraspinal abscess and granuloma: Secondary | ICD-10-CM | POA: Diagnosis not present

## 2020-03-08 DIAGNOSIS — R5081 Fever presenting with conditions classified elsewhere: Secondary | ICD-10-CM | POA: Diagnosis not present

## 2020-03-08 DIAGNOSIS — Z452 Encounter for adjustment and management of vascular access device: Secondary | ICD-10-CM

## 2020-03-08 DIAGNOSIS — R918 Other nonspecific abnormal finding of lung field: Secondary | ICD-10-CM
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
## 2020-03-09 DIAGNOSIS — T8149XA Infection following a procedure, other surgical site, initial encounter: Secondary | ICD-10-CM | POA: Diagnosis not present

## 2020-03-09 DIAGNOSIS — G061 Intraspinal abscess and granuloma: Secondary | ICD-10-CM | POA: Diagnosis not present

## 2020-03-09 DIAGNOSIS — I2510 Atherosclerotic heart disease of native coronary artery without angina pectoris: Secondary | ICD-10-CM | POA: Diagnosis not present

## 2020-03-09 DIAGNOSIS — B9561 Methicillin susceptible Staphylococcus aureus infection as the cause of diseases classified elsewhere: Secondary | ICD-10-CM | POA: Diagnosis not present

## 2020-03-09 DIAGNOSIS — M7520 Bicipital tendinitis, unspecified shoulder: Secondary | ICD-10-CM | POA: Diagnosis not present

## 2020-03-09 DIAGNOSIS — Z466 Encounter for fitting and adjustment of urinary device: Secondary | ICD-10-CM | POA: Diagnosis not present

## 2020-03-09 DIAGNOSIS — M4804 Spinal stenosis, thoracic region: Secondary | ICD-10-CM | POA: Diagnosis not present

## 2020-03-09 DIAGNOSIS — G20 Parkinson's disease: Secondary | ICD-10-CM | POA: Diagnosis not present

## 2020-03-09 DIAGNOSIS — I700 Atherosclerosis of aorta: Secondary | ICD-10-CM | POA: Diagnosis not present

## 2020-03-09 DIAGNOSIS — G8929 Other chronic pain: Secondary | ICD-10-CM | POA: Diagnosis not present

## 2020-03-09 DIAGNOSIS — Z9181 History of falling: Secondary | ICD-10-CM | POA: Diagnosis not present

## 2020-03-09 DIAGNOSIS — Z792 Long term (current) use of antibiotics: Secondary | ICD-10-CM | POA: Diagnosis not present

## 2020-03-09 DIAGNOSIS — J439 Emphysema, unspecified: Secondary | ICD-10-CM | POA: Diagnosis not present

## 2020-03-09 DIAGNOSIS — Z452 Encounter for adjustment and management of vascular access device: Secondary | ICD-10-CM | POA: Diagnosis not present

## 2020-03-09 DIAGNOSIS — M1711 Unilateral primary osteoarthritis, right knee: Secondary | ICD-10-CM | POA: Diagnosis not present

## 2020-03-10 DIAGNOSIS — T8149XA Infection following a procedure, other surgical site, initial encounter: Secondary | ICD-10-CM | POA: Diagnosis not present

## 2020-03-10 DIAGNOSIS — I2510 Atherosclerotic heart disease of native coronary artery without angina pectoris: Secondary | ICD-10-CM | POA: Diagnosis not present

## 2020-03-10 DIAGNOSIS — G8929 Other chronic pain: Secondary | ICD-10-CM | POA: Diagnosis not present

## 2020-03-10 DIAGNOSIS — G061 Intraspinal abscess and granuloma: Secondary | ICD-10-CM | POA: Diagnosis not present

## 2020-03-10 DIAGNOSIS — M7520 Bicipital tendinitis, unspecified shoulder: Secondary | ICD-10-CM | POA: Diagnosis not present

## 2020-03-10 DIAGNOSIS — Z452 Encounter for adjustment and management of vascular access device: Secondary | ICD-10-CM | POA: Diagnosis not present

## 2020-03-10 DIAGNOSIS — J439 Emphysema, unspecified: Secondary | ICD-10-CM | POA: Diagnosis not present

## 2020-03-10 DIAGNOSIS — Z9181 History of falling: Secondary | ICD-10-CM | POA: Diagnosis not present

## 2020-03-10 DIAGNOSIS — I700 Atherosclerosis of aorta: Secondary | ICD-10-CM | POA: Diagnosis not present

## 2020-03-10 DIAGNOSIS — Z792 Long term (current) use of antibiotics: Secondary | ICD-10-CM | POA: Diagnosis not present

## 2020-03-10 DIAGNOSIS — M1711 Unilateral primary osteoarthritis, right knee: Secondary | ICD-10-CM | POA: Diagnosis not present

## 2020-03-10 DIAGNOSIS — G20 Parkinson's disease: Secondary | ICD-10-CM | POA: Diagnosis not present

## 2020-03-10 DIAGNOSIS — M4804 Spinal stenosis, thoracic region: Secondary | ICD-10-CM | POA: Diagnosis not present

## 2020-03-10 DIAGNOSIS — B9561 Methicillin susceptible Staphylococcus aureus infection as the cause of diseases classified elsewhere: Secondary | ICD-10-CM | POA: Diagnosis not present

## 2020-03-10 DIAGNOSIS — Z466 Encounter for fitting and adjustment of urinary device: Secondary | ICD-10-CM | POA: Diagnosis not present

## 2020-03-11 DIAGNOSIS — G894 Chronic pain syndrome: Secondary | ICD-10-CM | POA: Diagnosis not present

## 2020-03-12 ENCOUNTER — Other Ambulatory Visit: Payer: Self-pay | Admitting: *Deleted

## 2020-03-12 DIAGNOSIS — I2510 Atherosclerotic heart disease of native coronary artery without angina pectoris: Secondary | ICD-10-CM | POA: Diagnosis not present

## 2020-03-12 DIAGNOSIS — M4804 Spinal stenosis, thoracic region: Secondary | ICD-10-CM | POA: Diagnosis not present

## 2020-03-12 DIAGNOSIS — G061 Intraspinal abscess and granuloma: Secondary | ICD-10-CM | POA: Diagnosis not present

## 2020-03-12 DIAGNOSIS — I700 Atherosclerosis of aorta: Secondary | ICD-10-CM | POA: Diagnosis not present

## 2020-03-12 DIAGNOSIS — G8929 Other chronic pain: Secondary | ICD-10-CM | POA: Diagnosis not present

## 2020-03-12 DIAGNOSIS — A4901 Methicillin susceptible Staphylococcus aureus infection, unspecified site: Secondary | ICD-10-CM | POA: Diagnosis not present

## 2020-03-12 DIAGNOSIS — G062 Extradural and subdural abscess, unspecified: Secondary | ICD-10-CM | POA: Diagnosis not present

## 2020-03-12 DIAGNOSIS — J439 Emphysema, unspecified: Secondary | ICD-10-CM | POA: Diagnosis not present

## 2020-03-12 DIAGNOSIS — I10 Essential (primary) hypertension: Secondary | ICD-10-CM | POA: Diagnosis not present

## 2020-03-12 DIAGNOSIS — Z466 Encounter for fitting and adjustment of urinary device: Secondary | ICD-10-CM | POA: Diagnosis not present

## 2020-03-12 DIAGNOSIS — Z9181 History of falling: Secondary | ICD-10-CM | POA: Diagnosis not present

## 2020-03-12 DIAGNOSIS — R600 Localized edema: Secondary | ICD-10-CM | POA: Diagnosis not present

## 2020-03-12 DIAGNOSIS — M7520 Bicipital tendinitis, unspecified shoulder: Secondary | ICD-10-CM | POA: Diagnosis not present

## 2020-03-12 DIAGNOSIS — Z792 Long term (current) use of antibiotics: Secondary | ICD-10-CM | POA: Diagnosis not present

## 2020-03-12 DIAGNOSIS — B9561 Methicillin susceptible Staphylococcus aureus infection as the cause of diseases classified elsewhere: Secondary | ICD-10-CM | POA: Diagnosis not present

## 2020-03-12 DIAGNOSIS — G20 Parkinson's disease: Secondary | ICD-10-CM | POA: Diagnosis not present

## 2020-03-12 DIAGNOSIS — R7881 Bacteremia: Secondary | ICD-10-CM | POA: Diagnosis not present

## 2020-03-12 DIAGNOSIS — E876 Hypokalemia: Secondary | ICD-10-CM | POA: Diagnosis not present

## 2020-03-12 DIAGNOSIS — M1711 Unilateral primary osteoarthritis, right knee: Secondary | ICD-10-CM | POA: Diagnosis not present

## 2020-03-12 DIAGNOSIS — E559 Vitamin D deficiency, unspecified: Secondary | ICD-10-CM | POA: Diagnosis not present

## 2020-03-12 DIAGNOSIS — Z452 Encounter for adjustment and management of vascular access device: Secondary | ICD-10-CM | POA: Diagnosis not present

## 2020-03-12 DIAGNOSIS — T8149XA Infection following a procedure, other surgical site, initial encounter: Secondary | ICD-10-CM | POA: Diagnosis not present

## 2020-03-12 NOTE — Patient Outreach (Signed)
[facility]) Care Management  03/12/2020  Kent Flowers [facility] Dec 23, 1945 [id number]   CSW made an attempt to try and contact patient today to follow-up regarding social work services and resources, as well as to ensure that patient received the packet of resource information mailed to his home by CSW on Tuesday, March 02, 2020.  However, patient was unavailable at the time of CSW's call.  CSW left a HIPAA compliant message on voicemail for patient and is currently awaiting a return call.  CSW will make a second outreach attempt within the next 3-4 business days, if a return call is not received from patient in the meantime.  CSW wants to ensure that patient received all of the following information that was requested, as well as offer assistance with completion, if necessary:  [place] Department of Health and Estate agent for [employer]; [employer]; [employer].  Kent Flowers, BSW, MSW, LCSW  Licensed Education officer, environmental Health System  Mailing [place] [address], [place], [place] [address] Physical Address-[address], [place], [place] [address] Toll Free Main # [phone] Fax # [phone] Cell # [phone]  Office # [phone] [name]

## 2020-03-15 LAB — CULTURE, BLOOD (SINGLE)
MICRO NUMBER:: 10287306
MICRO NUMBER:: 10287307
Result:: NO GROWTH
Result:: NO GROWTH
SPECIMEN QUALITY:: ADEQUATE
SPECIMEN QUALITY:: ADEQUATE

## 2020-03-16 DIAGNOSIS — N35011 Post-traumatic bulbous urethral stricture: Secondary | ICD-10-CM | POA: Diagnosis not present

## 2020-03-16 DIAGNOSIS — N319 Neuromuscular dysfunction of bladder, unspecified: Secondary | ICD-10-CM | POA: Diagnosis not present

## 2020-03-16 DIAGNOSIS — R310 Gross hematuria: Secondary | ICD-10-CM | POA: Diagnosis not present

## 2020-03-17 ENCOUNTER — Encounter: Payer: Self-pay | Admitting: Infectious Disease

## 2020-03-17 DIAGNOSIS — M1711 Unilateral primary osteoarthritis, right knee: Secondary | ICD-10-CM | POA: Diagnosis not present

## 2020-03-17 DIAGNOSIS — G061 Intraspinal abscess and granuloma: Secondary | ICD-10-CM | POA: Diagnosis not present

## 2020-03-17 DIAGNOSIS — G8929 Other chronic pain: Secondary | ICD-10-CM | POA: Diagnosis not present

## 2020-03-17 DIAGNOSIS — Z7689 Persons encountering health services in other specified circumstances: Secondary | ICD-10-CM | POA: Diagnosis not present

## 2020-03-17 DIAGNOSIS — I2510 Atherosclerotic heart disease of native coronary artery without angina pectoris: Secondary | ICD-10-CM | POA: Diagnosis not present

## 2020-03-17 DIAGNOSIS — B9561 Methicillin susceptible Staphylococcus aureus infection as the cause of diseases classified elsewhere: Secondary | ICD-10-CM | POA: Diagnosis not present

## 2020-03-17 DIAGNOSIS — T8149XA Infection following a procedure, other surgical site, initial encounter: Secondary | ICD-10-CM | POA: Diagnosis not present

## 2020-03-17 DIAGNOSIS — J439 Emphysema, unspecified: Secondary | ICD-10-CM | POA: Diagnosis not present

## 2020-03-17 DIAGNOSIS — Z792 Long term (current) use of antibiotics: Secondary | ICD-10-CM | POA: Diagnosis not present

## 2020-03-17 DIAGNOSIS — G20 Parkinson's disease: Secondary | ICD-10-CM | POA: Diagnosis not present

## 2020-03-17 DIAGNOSIS — M7520 Bicipital tendinitis, unspecified shoulder: Secondary | ICD-10-CM | POA: Diagnosis not present

## 2020-03-17 DIAGNOSIS — Z9181 History of falling: Secondary | ICD-10-CM | POA: Diagnosis not present

## 2020-03-17 DIAGNOSIS — M4804 Spinal stenosis, thoracic region: Secondary | ICD-10-CM | POA: Diagnosis not present

## 2020-03-17 DIAGNOSIS — I700 Atherosclerosis of aorta: Secondary | ICD-10-CM | POA: Diagnosis not present

## 2020-03-17 DIAGNOSIS — Z466 Encounter for fitting and adjustment of urinary device: Secondary | ICD-10-CM | POA: Diagnosis not present

## 2020-03-17 DIAGNOSIS — Z452 Encounter for adjustment and management of vascular access device: Secondary | ICD-10-CM | POA: Diagnosis not present

## 2020-03-18 ENCOUNTER — Encounter: Payer: Self-pay | Admitting: *Deleted

## 2020-03-18 ENCOUNTER — Other Ambulatory Visit: Payer: Self-pay | Admitting: *Deleted

## 2020-03-18 NOTE — Patient Outreach (Signed)
[facility]) Care Management  03/18/2020  Kent Flowers [facility] [birth year] [id number]  CSW was able to make contact with patient and patient's wife, Kent Flowers today to follow-up regarding social work services and resources, as well as to confirm that patient received the packet of resource information mailed to his home by CSW on Tuesday, March 02, 2020.  Patient confirmed receipt of all of the following resource information and application: [employer] of Health and Estate agent for [employer]; [employer]; [employer].  Patient denied having any questions about the information received or needing assistance with application completion.  CSW will perform a case closure on patient, as all goals of treatment have been met from social work standpoint and no additional social work needs have been identified at this time.  CSW will notify patient's RNCM with [employer], Valente David of CSW's plans to close patient's case.  CSW will fax an update to patient's Primary Care Physician, Dr. Carylon Perches to ensure that they are aware of CSW's involvement with patient's plan of care.  CSW was able to confirm that patient has the correct contact information for CSW, encouraging patient to contact CSW directly if additional social work needs arise in the near future.    Nat Christen, BSW, MSW, LCSW  Licensed Education officer, environmental Health System  Mailing [place] [address] [place], [place] [address] Physical Address-[address], [place], [place] [address] Toll Free Main # [phone] Fax # [phone] Cell # [phone]  Office # [phone] [name]

## 2020-03-19 ENCOUNTER — Ambulatory Visit (HOSPITAL_COMMUNITY): Payer: Medicare Other

## 2020-03-19 ENCOUNTER — Ambulatory Visit (HOSPITAL_COMMUNITY): Admission: RE | Admit: 2020-03-19 | Payer: Medicare Other | Source: Ambulatory Visit

## 2020-03-19 DIAGNOSIS — A4901 Methicillin susceptible Staphylococcus aureus infection, unspecified site: Secondary | ICD-10-CM | POA: Diagnosis not present

## 2020-03-19 DIAGNOSIS — R7881 Bacteremia: Secondary | ICD-10-CM | POA: Diagnosis not present

## 2020-03-19 DIAGNOSIS — B9561 Methicillin susceptible Staphylococcus aureus infection as the cause of diseases classified elsewhere: Secondary | ICD-10-CM | POA: Diagnosis not present

## 2020-03-19 DIAGNOSIS — G062 Extradural and subdural abscess, unspecified: Secondary | ICD-10-CM | POA: Diagnosis not present

## 2020-03-24 ENCOUNTER — Ambulatory Visit (INDEPENDENT_AMBULATORY_CARE_PROVIDER_SITE_OTHER): Payer: Medicare Other | Admitting: Infectious Disease

## 2020-03-24 ENCOUNTER — Encounter: Payer: Self-pay | Admitting: Infectious Disease

## 2020-03-24 ENCOUNTER — Other Ambulatory Visit: Payer: Self-pay

## 2020-03-24 ENCOUNTER — Telehealth: Payer: Self-pay | Admitting: *Deleted

## 2020-03-24 VITALS — BP 131/73 | HR 92 | Temp 98.2°F | Wt 177.0 lb

## 2020-03-24 DIAGNOSIS — M4620 Osteomyelitis of vertebra, site unspecified: Secondary | ICD-10-CM

## 2020-03-24 DIAGNOSIS — G062 Extradural and subdural abscess, unspecified: Secondary | ICD-10-CM

## 2020-03-24 DIAGNOSIS — Z9889 Other specified postprocedural states: Secondary | ICD-10-CM | POA: Diagnosis not present

## 2020-03-24 DIAGNOSIS — G061 Intraspinal abscess and granuloma: Secondary | ICD-10-CM | POA: Diagnosis not present

## 2020-03-24 DIAGNOSIS — R32 Unspecified urinary incontinence: Secondary | ICD-10-CM

## 2020-03-24 DIAGNOSIS — M7989 Other specified soft tissue disorders: Secondary | ICD-10-CM | POA: Diagnosis not present

## 2020-03-24 DIAGNOSIS — R159 Full incontinence of feces: Secondary | ICD-10-CM | POA: Insufficient documentation

## 2020-03-24 HISTORY — DX: Full incontinence of feces: R15.9

## 2020-03-24 HISTORY — DX: Unspecified urinary incontinence: R32
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
## 2020-03-25 LAB — BASIC METABOLIC PANEL WITH GFR
BUN: 19 mg/dL (ref 7–25)
CO2: 33 mmol/L — ABNORMAL HIGH (ref 20–32)
Calcium: 8.5 mg/dL — ABNORMAL LOW (ref 8.6–10.3)
Chloride: 100 mmol/L (ref 98–110)
Creat: 1.17 mg/dL (ref 0.70–1.18)
GFR, Est African American: 72 mL/min/{1.73_m2} (ref >=60)
GFR, Est Non African American: 62 mL/min/{1.73_m2} (ref >=60)
Glucose, Bld: 111 mg/dL — ABNORMAL HIGH (ref 65–99)
Potassium: 4 mmol/L (ref 3.5–5.3)
Sodium: 141 mmol/L (ref 135–146)

## 2020-03-25 LAB — CBC WITH DIFFERENTIAL/PLATELET
Absolute Monocytes: 733 cells/uL (ref 200–950)
Basophils Absolute: 47 cells/uL (ref 0–200)
Basophils Relative: 0.6 %
Eosinophils Absolute: 616 cells/uL — ABNORMAL HIGH (ref 15–500)
Eosinophils Relative: 7.9 %
HCT: 29 % — ABNORMAL LOW (ref 38.5–50.0)
Hemoglobin: 9.7 g/dL — ABNORMAL LOW (ref 13.2–17.1)
Lymphs Abs: 2168 cells/uL (ref 850–3900)
MCH: 31.5 pg (ref 27.0–33.0)
MCHC: 33.4 g/dL (ref 32.0–36.0)
MCV: 94.2 fL (ref 80.0–100.0)
MPV: 8.9 fL (ref 7.5–12.5)
Monocytes Relative: 9.4 %
Neutro Abs: 4235 cells/uL (ref 1500–7800)
Neutrophils Relative %: 54.3 %
Platelets: 378 10*3/uL (ref 140–400)
RBC: 3.08 10*6/uL — ABNORMAL LOW (ref 4.20–5.80)
RDW: 14.8 % (ref 11.0–15.0)
Total Lymphocyte: 27.8 %
WBC: 7.8 10*3/uL (ref 3.8–10.8)

## 2020-03-25 LAB — SEDIMENTATION RATE: Sed Rate: 110 mm/h — ABNORMAL HIGH (ref 0–20)

## 2020-03-25 LAB — C-REACTIVE PROTEIN: CRP: 94.7 mg/L — ABNORMAL HIGH (ref <8.0)

## 2020-03-25 NOTE — Telephone Encounter (Signed)
Per Dr Tommy Medal, relayed new end date of 04/06/20 for IV antibiotics, pending MRI results. Order repeated and verified by Lenna Sciara at [place]. Landis Gandy, RN

## 2020-03-26 DIAGNOSIS — G062 Extradural and subdural abscess, unspecified: Secondary | ICD-10-CM | POA: Diagnosis not present

## 2020-03-26 DIAGNOSIS — B9561 Methicillin susceptible Staphylococcus aureus infection as the cause of diseases classified elsewhere: Secondary | ICD-10-CM | POA: Diagnosis not present

## 2020-03-26 DIAGNOSIS — R7881 Bacteremia: Secondary | ICD-10-CM | POA: Diagnosis not present

## 2020-03-26 DIAGNOSIS — M25561 Pain in right knee: Secondary | ICD-10-CM | POA: Diagnosis not present

## 2020-03-26 DIAGNOSIS — R600 Localized edema: Secondary | ICD-10-CM | POA: Diagnosis not present

## 2020-03-26 DIAGNOSIS — Z79899 Other long term (current) drug therapy: Secondary | ICD-10-CM | POA: Diagnosis not present

## 2020-03-26 DIAGNOSIS — G8929 Other chronic pain: Secondary | ICD-10-CM | POA: Diagnosis not present

## 2020-03-26 DIAGNOSIS — A4901 Methicillin susceptible Staphylococcus aureus infection, unspecified site: Secondary | ICD-10-CM | POA: Diagnosis not present

## 2020-03-26 DIAGNOSIS — M25562 Pain in left knee: Secondary | ICD-10-CM | POA: Diagnosis not present

## 2020-03-27 DIAGNOSIS — I700 Atherosclerosis of aorta: Secondary | ICD-10-CM | POA: Diagnosis not present

## 2020-03-27 DIAGNOSIS — G8929 Other chronic pain: Secondary | ICD-10-CM | POA: Diagnosis not present

## 2020-03-27 DIAGNOSIS — Z452 Encounter for adjustment and management of vascular access device: Secondary | ICD-10-CM | POA: Diagnosis not present

## 2020-03-27 DIAGNOSIS — T8149XA Infection following a procedure, other surgical site, initial encounter: Secondary | ICD-10-CM | POA: Diagnosis not present

## 2020-03-27 DIAGNOSIS — G20 Parkinson's disease: Secondary | ICD-10-CM | POA: Diagnosis not present

## 2020-03-27 DIAGNOSIS — B9561 Methicillin susceptible Staphylococcus aureus infection as the cause of diseases classified elsewhere: Secondary | ICD-10-CM | POA: Diagnosis not present

## 2020-03-27 DIAGNOSIS — M1711 Unilateral primary osteoarthritis, right knee: Secondary | ICD-10-CM | POA: Diagnosis not present

## 2020-03-27 DIAGNOSIS — M4804 Spinal stenosis, thoracic region: Secondary | ICD-10-CM | POA: Diagnosis not present

## 2020-03-27 DIAGNOSIS — M7520 Bicipital tendinitis, unspecified shoulder: Secondary | ICD-10-CM | POA: Diagnosis not present

## 2020-03-27 DIAGNOSIS — G061 Intraspinal abscess and granuloma: Secondary | ICD-10-CM | POA: Diagnosis not present

## 2020-03-27 DIAGNOSIS — I2510 Atherosclerotic heart disease of native coronary artery without angina pectoris: Secondary | ICD-10-CM | POA: Diagnosis not present

## 2020-03-27 DIAGNOSIS — J439 Emphysema, unspecified: Secondary | ICD-10-CM | POA: Diagnosis not present

## 2020-03-27 DIAGNOSIS — Z466 Encounter for fitting and adjustment of urinary device: Secondary | ICD-10-CM | POA: Diagnosis not present

## 2020-03-27 DIAGNOSIS — Z9181 History of falling: Secondary | ICD-10-CM | POA: Diagnosis not present

## 2020-03-27 DIAGNOSIS — Z792 Long term (current) use of antibiotics: Secondary | ICD-10-CM | POA: Diagnosis not present

## 2020-03-30 DIAGNOSIS — Z466 Encounter for fitting and adjustment of urinary device: Secondary | ICD-10-CM | POA: Diagnosis not present

## 2020-03-30 DIAGNOSIS — B9561 Methicillin susceptible Staphylococcus aureus infection as the cause of diseases classified elsewhere: Secondary | ICD-10-CM | POA: Diagnosis not present

## 2020-03-30 DIAGNOSIS — T8149XA Infection following a procedure, other surgical site, initial encounter: Secondary | ICD-10-CM | POA: Diagnosis not present

## 2020-03-30 DIAGNOSIS — Z9181 History of falling: Secondary | ICD-10-CM | POA: Diagnosis not present

## 2020-03-30 DIAGNOSIS — G20 Parkinson's disease: Secondary | ICD-10-CM | POA: Diagnosis not present

## 2020-03-30 DIAGNOSIS — M4804 Spinal stenosis, thoracic region: Secondary | ICD-10-CM | POA: Diagnosis not present

## 2020-03-30 DIAGNOSIS — M7520 Bicipital tendinitis, unspecified shoulder: Secondary | ICD-10-CM | POA: Diagnosis not present

## 2020-03-30 DIAGNOSIS — Z792 Long term (current) use of antibiotics: Secondary | ICD-10-CM | POA: Diagnosis not present

## 2020-03-30 DIAGNOSIS — I700 Atherosclerosis of aorta: Secondary | ICD-10-CM | POA: Diagnosis not present

## 2020-03-30 DIAGNOSIS — G061 Intraspinal abscess and granuloma: Secondary | ICD-10-CM | POA: Diagnosis not present

## 2020-03-30 DIAGNOSIS — G8929 Other chronic pain: Secondary | ICD-10-CM | POA: Diagnosis not present

## 2020-03-30 DIAGNOSIS — J439 Emphysema, unspecified: Secondary | ICD-10-CM | POA: Diagnosis not present

## 2020-03-30 DIAGNOSIS — M1711 Unilateral primary osteoarthritis, right knee: Secondary | ICD-10-CM | POA: Diagnosis not present

## 2020-03-30 DIAGNOSIS — Z452 Encounter for adjustment and management of vascular access device: Secondary | ICD-10-CM | POA: Diagnosis not present

## 2020-03-30 DIAGNOSIS — I2510 Atherosclerotic heart disease of native coronary artery without angina pectoris: Secondary | ICD-10-CM | POA: Diagnosis not present

## 2020-03-31 ENCOUNTER — Other Ambulatory Visit: Payer: Self-pay | Admitting: *Deleted

## 2020-03-31 NOTE — Patient Outreach (Signed)
[facility]) Care Management  03/31/2020  Dior Stepter [facility] April 13, 1946 [id number]   Call placed to member to follow up on management of vertebral infection.  He had follow up with [facility] on 3/22 as well as 4/7.  Member has continued to have intermittent low grade fevers, will have another MRI on 4/16, follow up with [facility] on 5/24.  Report he feel he is not walking as well a she was a couple weeks ago, state he is now more dependent on his walker.  State PT sessions has ended, reportedly was told he was at his goal.  Report his right leg/foot is swollen, having more pain in his knees.  Was supposed to have knee replacement prior to recent admissions for infection, operation has since been delayed.  He feel the decrease in mobility and swelling is related to the need for the surgery.  He has not been in contact with orthopedic MD but will call to discuss follow up.  He will also call to schedule follow up for PCP as he hasn't been seen since December.    Denies any urgent concerns at this time, advised to contact this care manager with questions.  Will follow up within the next month.  [facility] CM Care Plan Problem One     Most Recent Value  Care Plan Problem One  Risk for readmission related to infection as evidenced by recent hospitalization  Role Documenting the Problem One  Care Management [place] for Problem One  Active  [facility] Long Term Goal   Member will not be readmitted to hospital within the next 31 days  [facility] Long Term Goal Start Date  02/20/20  [facility] Long Term Goal Met Date  03/31/20  [facility] CM Short Term Goal #1   Member will report taking medications as prescribed over the next 3 weeks  [facility] CM Short Term Goal #1 Start Date  02/20/20  [facility] CM Short Term Goal #1 Met Date  03/31/20  [facility] CM Short Term Goal #2   Member will not show signs of infection over the next 2 weeks  [facility] CM Short Term Goal #2 Start Date  02/20/20  [facility] CM Short Term Goal #2 Met Date  03/31/20     [facility] CM Care Plan Problem Two     Most Recent Value  Care Plan Problem Two  Knowledge deficit regarding management of chronic medical conditions  Role Documenting the Problem Two  Care Management Coordinator  Care Plan for Problem Two  Active  Interventions for Problem Two Long Term Goal   Reviewed plan of care for IV antibiotics with member.  Confirmed he is continuing to have visiting nurse as well as family performing dressing changes to PICC site  Victor Goal  Member will be free from infection within the next 45 days  THN Long Term Goal Start Date  03/31/20  [facility] CM Short Term Goal #1   Member will have follow up appointments with PCP and ortho specialist within the next 4 weeks  THN CM Short Term Goal #1 Start Date  03/31/20  Interventions for Short Term Goal #2   Educated member on importance of ongoing follow up with providers in effort to manage conditions  THN CM Short Term Goal #2   Member will report plan for extremity edema within the next 2 weeks  [facility]N CM Short Term Goal #2 Start Date  03/31/20  Interventions for Short Term Goal #2  Discussed prior use  of Lasix with member, advised to discuss reordering with PCP     Valente David, RN, MSN [place] Manager [phone]

## 2020-04-02 ENCOUNTER — Ambulatory Visit
Admission: RE | Admit: 2020-04-02 | Discharge: 2020-04-02 | Disposition: A | Discharge status: Home / Self Care | Payer: Medicare Other | Source: Ambulatory Visit | Attending: Infectious Disease | Admitting: Infectious Disease

## 2020-04-02 ENCOUNTER — Other Ambulatory Visit: Payer: Self-pay

## 2020-04-02 DIAGNOSIS — G061 Intraspinal abscess and granuloma: Secondary | ICD-10-CM

## 2020-04-02 DIAGNOSIS — G062 Extradural and subdural abscess, unspecified: Secondary | ICD-10-CM | POA: Diagnosis not present

## 2020-04-02 DIAGNOSIS — R5081 Fever presenting with conditions classified elsewhere: Secondary | ICD-10-CM

## 2020-04-02 DIAGNOSIS — M4620 Osteomyelitis of vertebra, site unspecified: Secondary | ICD-10-CM

## 2020-04-02 DIAGNOSIS — R7881 Bacteremia: Secondary | ICD-10-CM | POA: Diagnosis not present

## 2020-04-02 IMAGING — MR MR CERVICAL SPINE WO/W CM
5 of 8 series · 28 of 48 positions shown · IV contrast (gadavist)
Comparison: MRI of the cervical spine [DATE] and [DATE].

CLINICAL DATA: Bacteremia.  Previous epidural abscess.  Follow-up.

EXAM:
MRI CERVICAL SPINE WITHOUT AND WITH CONTRAST
TECHNIQUE: Multiplanar and multiecho pulse sequences of the cervical spine, to
include the craniocervical junction and cervicothoracic junction,
were obtained without and with intravenous contrast.
CONTRAST:  8mL GADAVIST GADOBUTROL 1 MMOL/ML IV SOLN

[Series 20: T2 · sagittal · 3.0mm · 0.62mm/px · 5 of 15 slices shown (1 of 2)]
[im 1/15]
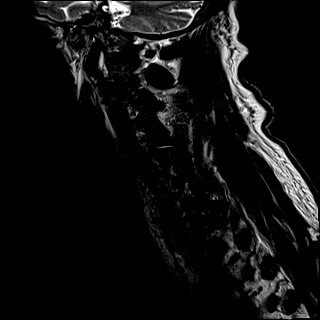
[im 4/15]
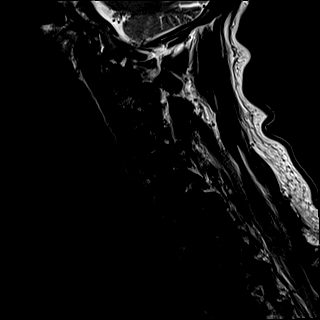
[im 8/15]
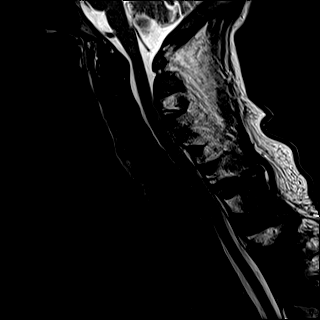
[im 11/15]
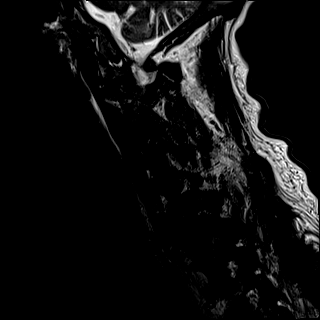
[im 15/15]
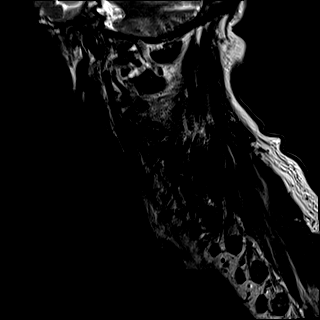

[Series 22: STIR · sagittal · 3.0mm · 0.62mm/px · 4 of 15 slices shown]
[im 1/15]
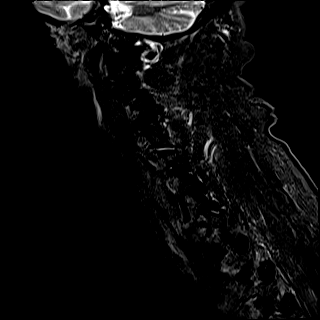
[im 5/15]
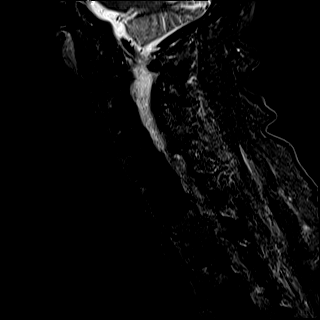
[im 10/15]
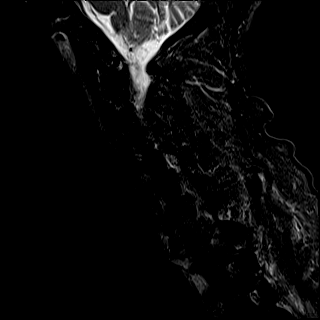
[im 15/15]
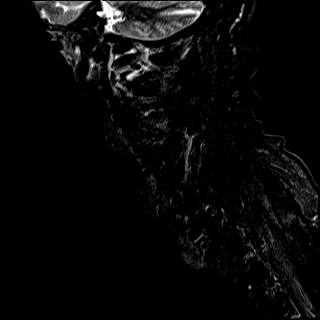

[Series 23: T2 · axial · 3.0mm · 0.70mm/px · z∈[-115,-23]mm · 7 of 27 slices shown (2 of 2)]
[im 1/27]
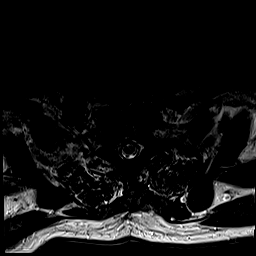
[im 5/27]
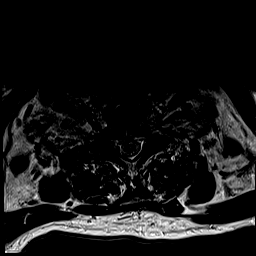
[im 9/27]
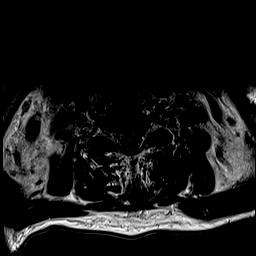
[im 14/27]
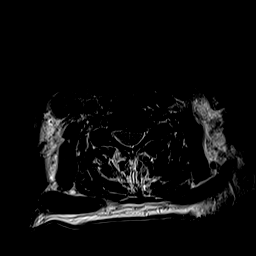
[im 18/27]
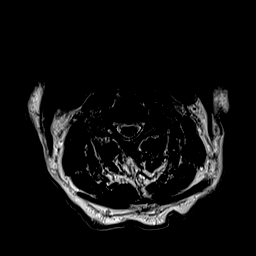
[im 22/27]
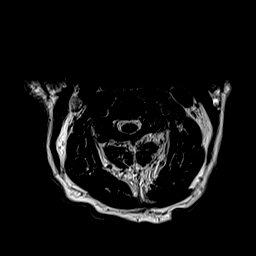
[im 27/27]
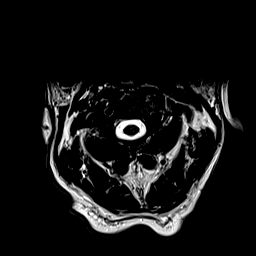

[Series 25: T1 · axial · non-contrast · 3.0mm · 0.35mm/px · z∈[-115,-23]mm · 8 of 29 slices shown]
[im 1/29]
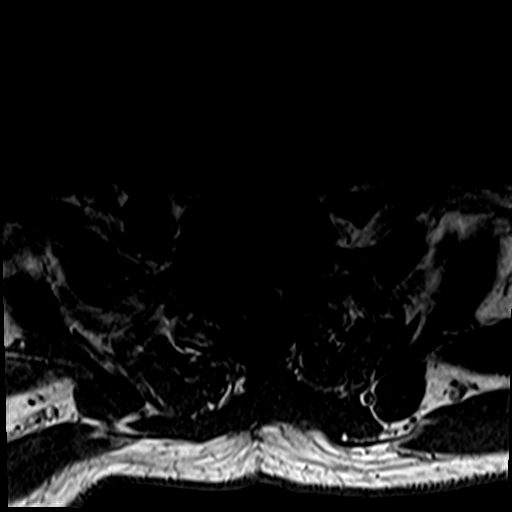
[im 5/29]
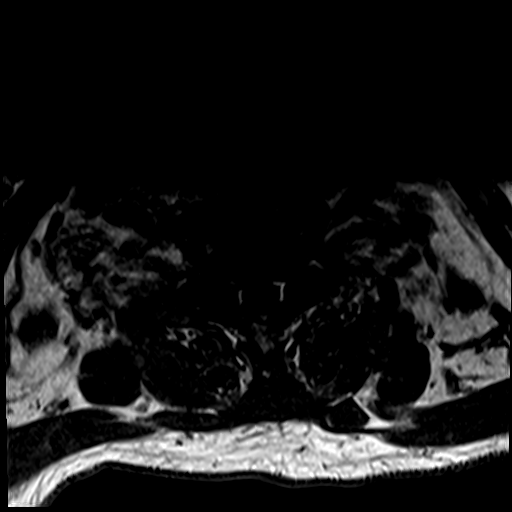
[im 9/29]
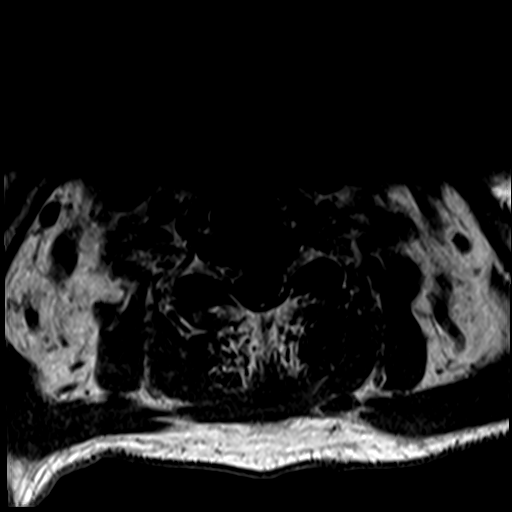
[im 13/29]
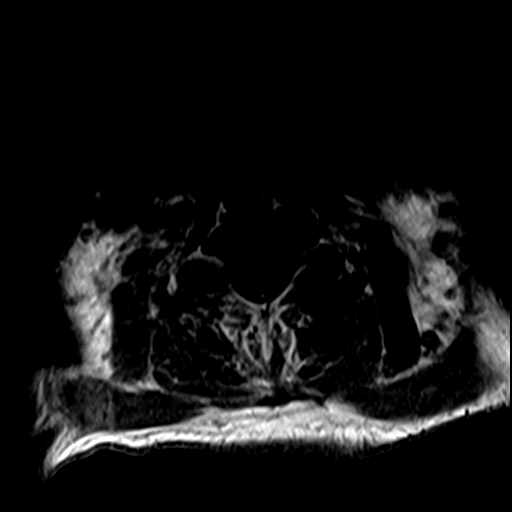
[im 17/29]
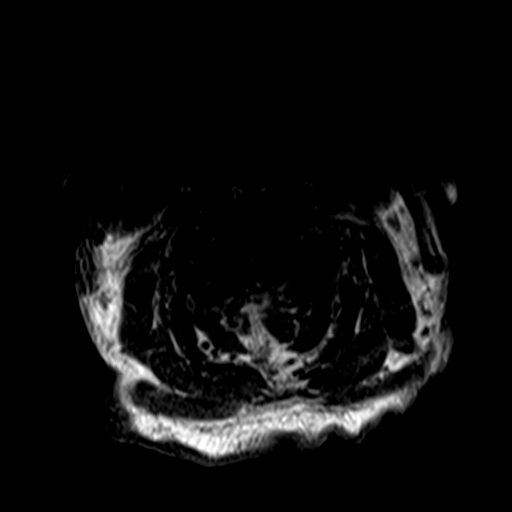
[im 21/29]
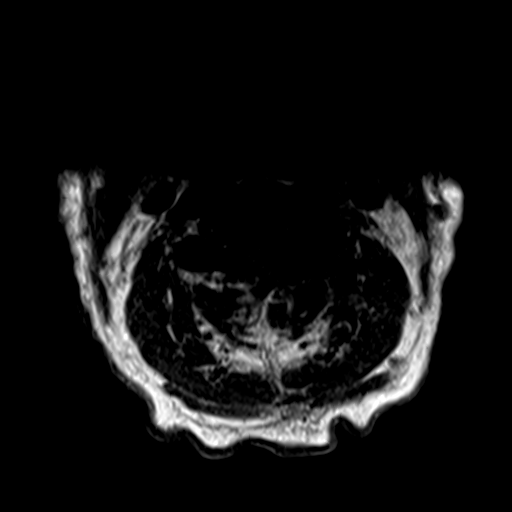
[im 25/29]
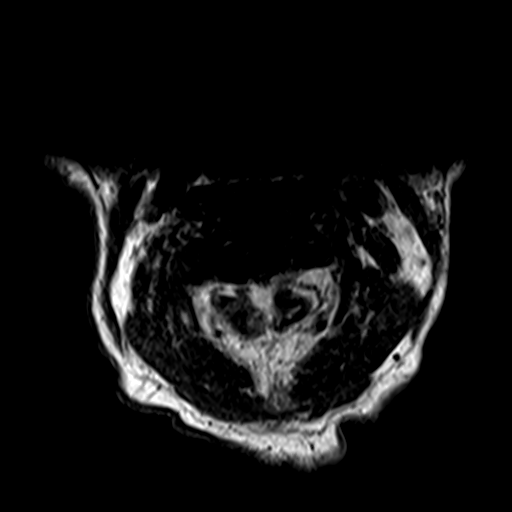
[im 29/29]
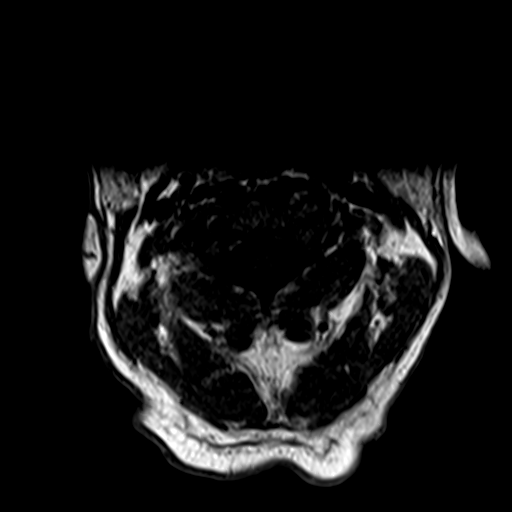

[Series 27: T1 post-contrast · axial · 3.0mm · 0.35mm/px · z∈[-115,-75]mm · 4 of 29 slices shown]
[im 1/29]
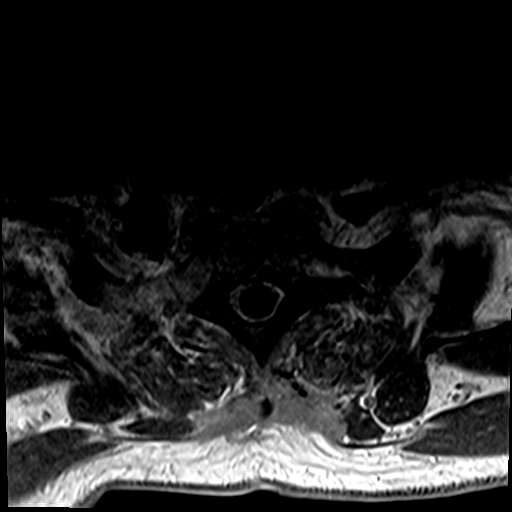
[im 5/29]
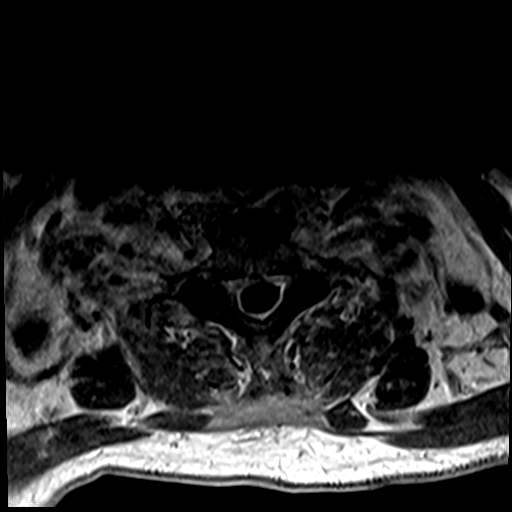
[im 9/29]
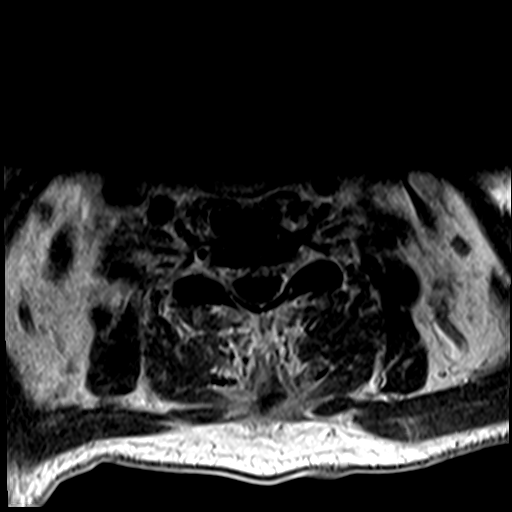
[im 13/29]
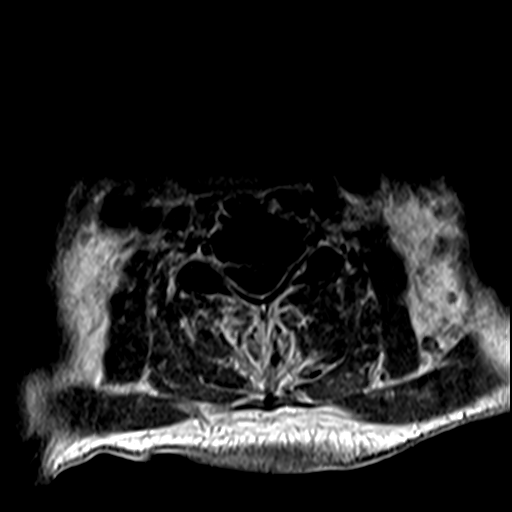

[28 of 48 positions shown; findings below may reference images not displayed]

FINDINGS: Alignment: No significant listhesis is present. There straightening
of the normal cervical lordosis

Vertebrae: T2 hyperintense marrow signal and enhancement is present
at C7 and T1. Marrow signal is depressed throughout the remainder of
the cervical spine. Vertebral body heights are otherwise normal.

Cord: Normal signal and morphology is present throughout the
cervical spine. No cord enhancement is present. Dural enhancement
extends superiorly to the level of C3-4.

Posterior Fossa, vertebral arteries, paraspinal tissues:
Craniocervical junction is normal. The visualized intracranial
contents are normal.

Disc levels:

C2-3: Uncovertebral spurring and facet hypertrophy lead to moderate
foraminal narrowing, right greater than left.

C3-4: A leftward disc osteophyte complex is present. Uncovertebral
and facet hypertrophy contribute to moderate foraminal stenosis,
left greater than right.

C4-5: A broad-based disc osteophyte complex effaces the ventral CSF.
Moderate foraminal narrowing is present bilaterally. Facet
hypertrophy is worse on the right.

C5-6: A left paramedian disc protrusion is present. This effaces the
ventral CSF and contacts the cord. Moderate left and mild right
foraminal narrowing is present.

C6-7: A broad-based disc protrusion is present. Partial effacement
of ventral CSF is noted. Foramina are patent.

C7-T1: Uncovertebral spurring and post infectious changes result in
moderate foraminal stenosis, right greater than left.
IMPRESSION: 1. Continued reduction dural enhancement, now extending superiorly
to the level of C3-4 and inferiorly beyond the craniocervical
junction.
2. Fused disc space at C7-T1, post infectious.
3. Residual abnormal signal and enhancement at C7-T[DATE] represent
residual infection. Changes often lag infection. No progression.
4. Multilevel spondylosis with mostly foraminal narrowing as
described.

## 2020-04-02 IMAGING — MR MR THORACIC SPINE WO/W CM
6 of 9 series · 28 of 48 positions shown · IV contrast (gadavist)
Comparison: MRI of the thoracic spine without and with contrast
[DATE]/.

CLINICAL DATA: Cervicothoracic epidural abscess. Status post
laminectomy. Follow-up.

EXAM:
MRI THORACIC WITHOUT AND WITH CONTRAST
TECHNIQUE: Multiplanar and multiecho pulse sequences of the thoracic spine were
obtained without and with intravenous contrast.
CONTRAST:  8mL GADAVIST GADOBUTROL 1 MMOL/ML IV SOLN

[Series 20: T2 · sagittal · 3.0mm · 1.06mm/px · 3 of 17 slices shown (1 of 2)]
[im 1/17]
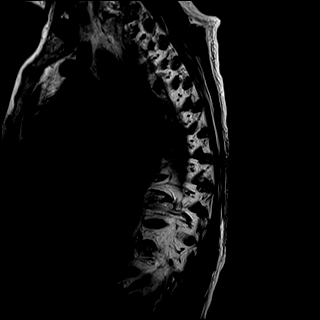
[im 9/17]
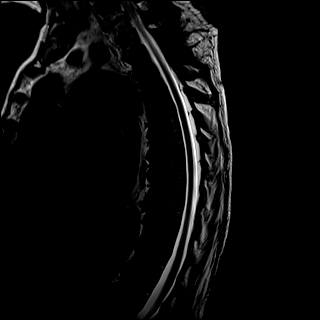
[im 17/17]
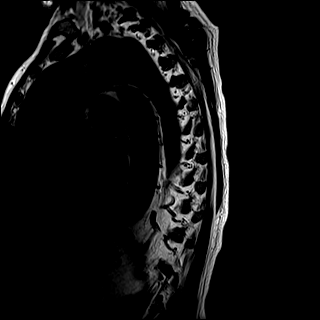

[Series 21: T1 · sagittal · 5.0mm · 1.41mm/px · 1 of 9 slices shown (1 of 3)]
[im 1/9]
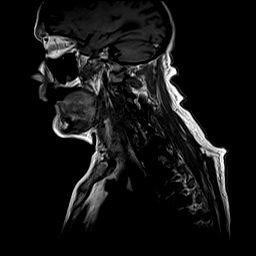

[Series 22: T1 · sagittal · 3.0mm · 1.06mm/px · 4 of 17 slices shown (2 of 3)]
[im 1/17]
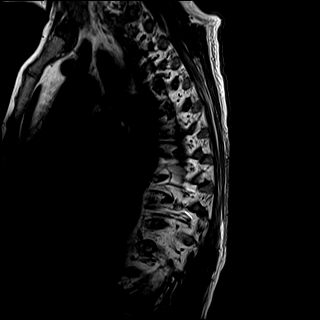
[im 6/17]
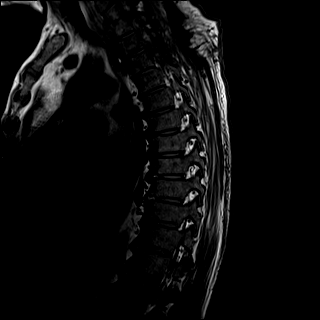
[im 11/17]
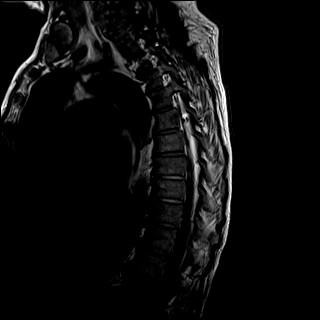
[im 17/17]
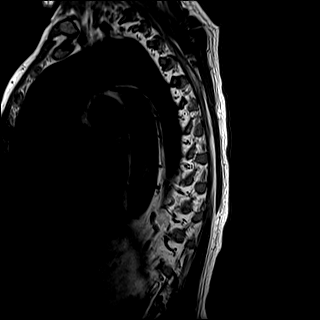

[Series 24: T2 · axial · 4.0mm · 0.59mm/px · z∈[-324,-110]mm · 8 of 39 slices shown (2 of 2)]
[im 1/39]
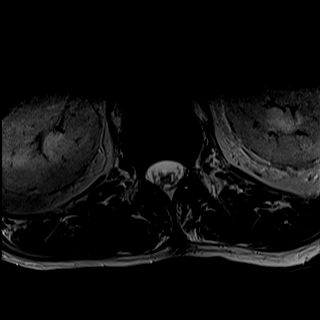
[im 6/39]
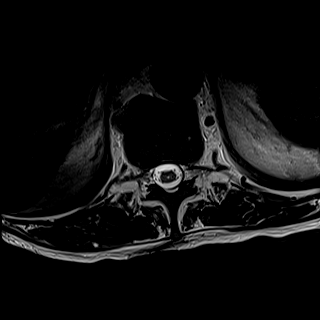
[im 11/39]
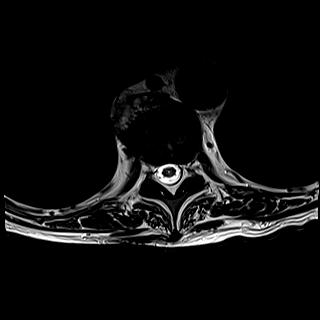
[im 17/39]
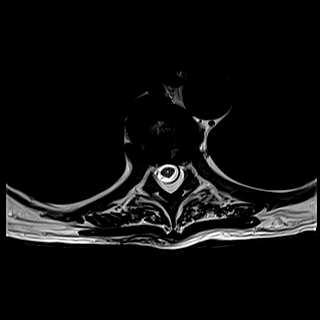
[im 22/39]
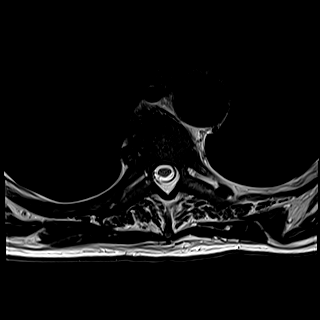
[im 28/39]
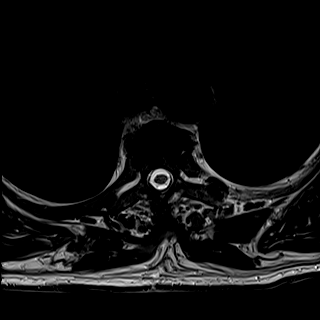
[im 33/39]
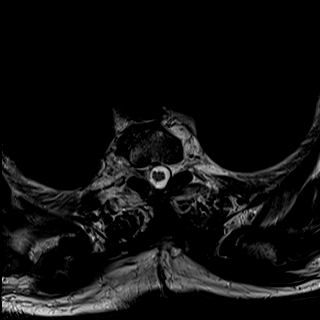
[im 39/39]
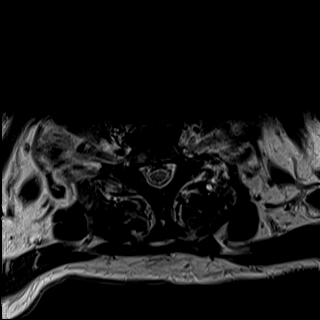

[Series 26: T1 · axial · non-contrast · 4.0mm · 0.31mm/px · z∈[-324,-110]mm · 8 of 39 slices shown (3 of 3)]
[im 1/39]
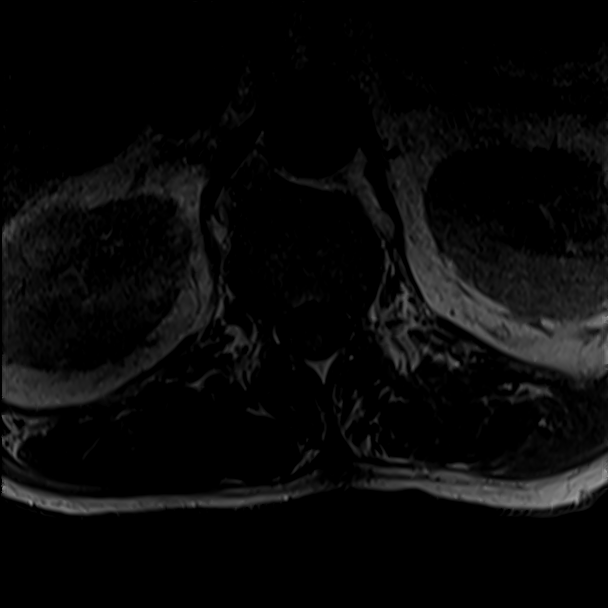
[im 6/39]
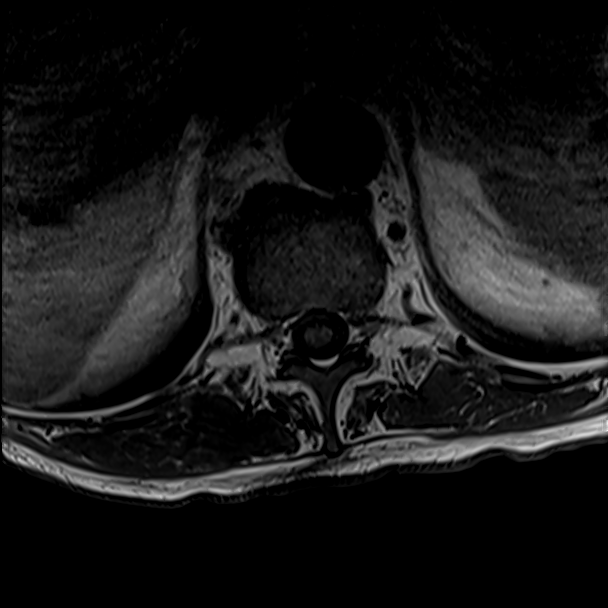
[im 11/39]
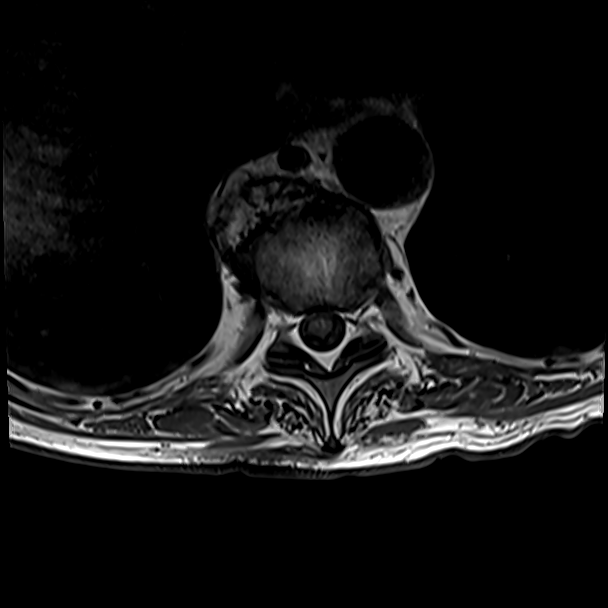
[im 17/39]
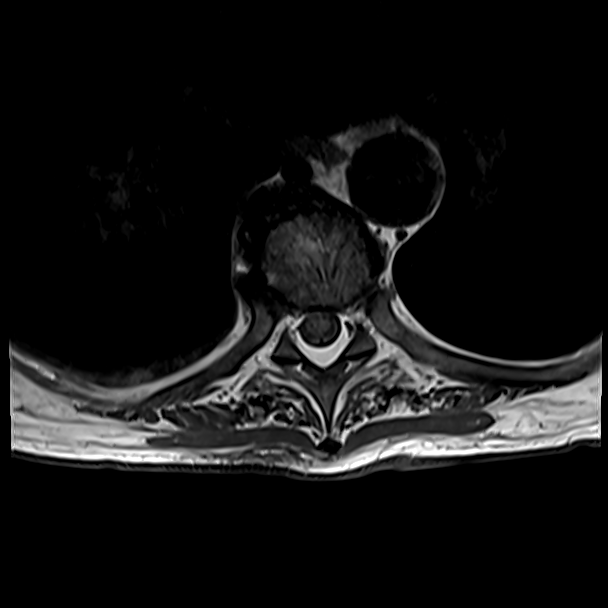
[im 22/39]
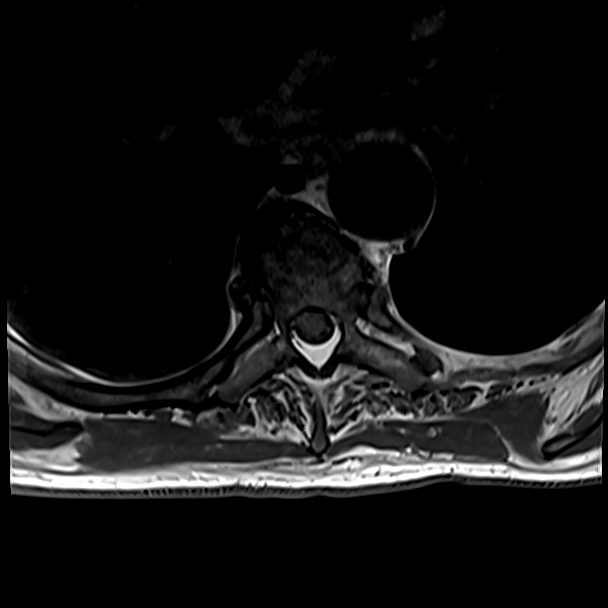
[im 28/39]
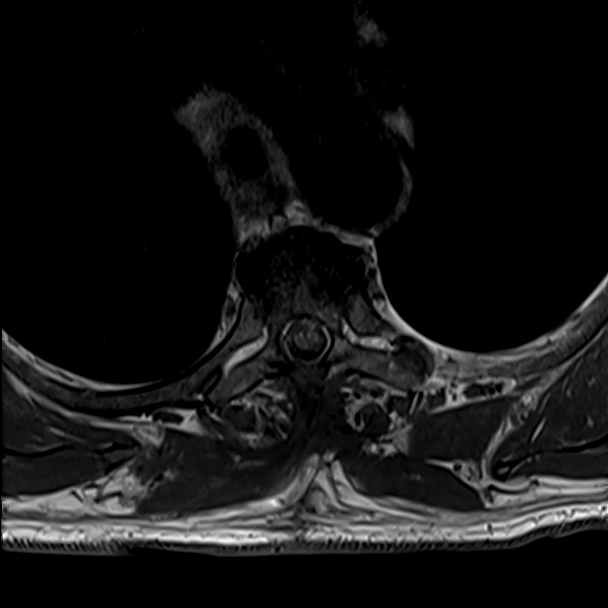
[im 33/39]
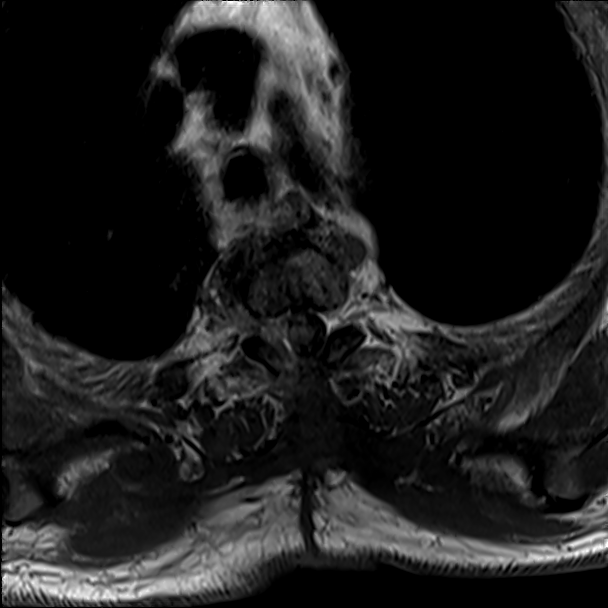
[im 39/39]
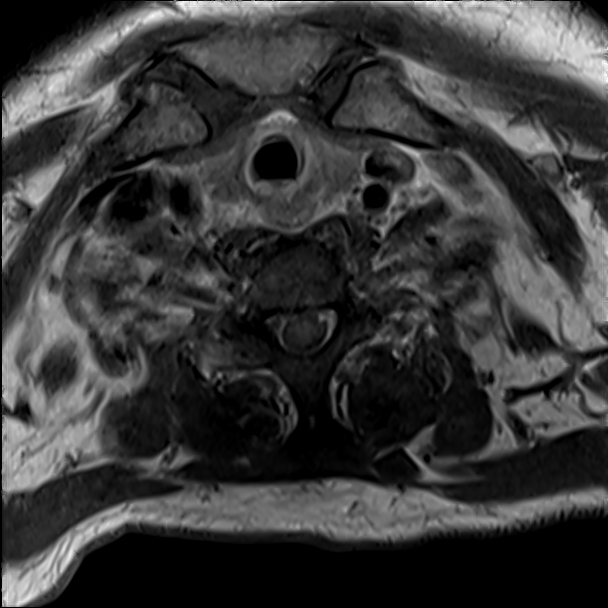

[Series 28: T1 fat-sat post-contrast · sagittal · 3.0mm · 1.06mm/px · 4 of 17 slices shown]
[im 1/17]
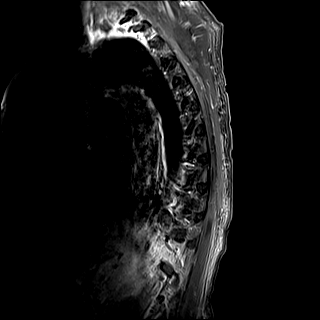
[im 6/17]
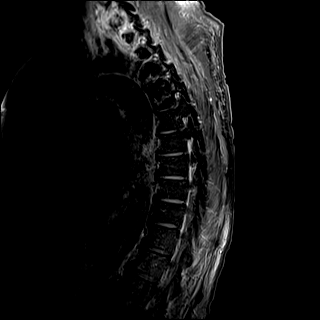
[im 11/17]
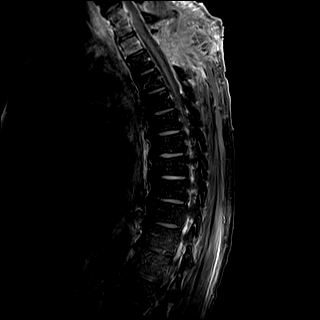
[im 17/17]
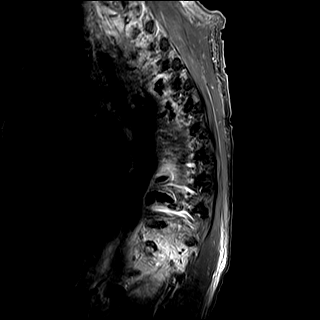

[28 of 48 positions shown; findings below may reference images not displayed]

FINDINGS: MRI THORACIC SPINE FINDINGS

Alignment:  No significant listhesis is present.

Vertebrae: Abnormal signal enhancement is again seen at C7, T1, and
T2. Signal has normalized at T3. Laminectomy is noted at T2 and T3.
Signal vertebral body heights are otherwise normal throughout the
remainder of the thoracic spine.

Cord: Cord size and morphology normal. No abnormal signal or
enhancement is present. Dural enhancement present in the lower
cervical spine and extends the level of T5. No focal epidural
abscess is present.

Paraspinal and other soft tissues: Extensive edema and enhancement
is present throughout posterior soft tissues the upper level of the
upper thoracic spine to T3. No residual fluid collection or abscess
is present.

Disc levels:

L2 and L3 laminectomies are present no significant disc disease or
stenosis is present. Abnormal enhancement is present in the foramina
bilaterally at T1-2 bilaterally T2-3 on the right. No discrete mass
is present.
IMPRESSION: 1. Resolved marrow signal and enhancement at T3.
2. Laminectomies at T2 and T3 without significant stenosis.
3. Diffuse soft tissue edema and enhancement at T3 and above without
focal abscess.
4. Dural enhancement T5 and above without epidural abscess.
5. No focal stenosis.
6. Abnormal enhancement extends into the foramina T1-2 and T2-3 as
described.

## 2020-04-02 MED ORDER — GADOBUTROL 1 MMOL/ML IV SOLN
8.0000 mL | Freq: Once | INTRAVENOUS | Status: AC | PRN
  Administered 2020-04-02: 8 mL via INTRAVENOUS

## 2020-04-03 DIAGNOSIS — G062 Extradural and subdural abscess, unspecified: Secondary | ICD-10-CM | POA: Diagnosis not present

## 2020-04-03 DIAGNOSIS — A4901 Methicillin susceptible Staphylococcus aureus infection, unspecified site: Secondary | ICD-10-CM | POA: Diagnosis not present

## 2020-04-03 DIAGNOSIS — B9561 Methicillin susceptible Staphylococcus aureus infection as the cause of diseases classified elsewhere: Secondary | ICD-10-CM | POA: Diagnosis not present

## 2020-04-03 DIAGNOSIS — R7881 Bacteremia: Secondary | ICD-10-CM | POA: Diagnosis not present

## 2020-04-04 DIAGNOSIS — B9561 Methicillin susceptible Staphylococcus aureus infection as the cause of diseases classified elsewhere: Secondary | ICD-10-CM | POA: Diagnosis not present

## 2020-04-04 DIAGNOSIS — G062 Extradural and subdural abscess, unspecified: Secondary | ICD-10-CM | POA: Diagnosis not present

## 2020-04-04 DIAGNOSIS — R7881 Bacteremia: Secondary | ICD-10-CM | POA: Diagnosis not present

## 2020-04-04 DIAGNOSIS — A4901 Methicillin susceptible Staphylococcus aureus infection, unspecified site: Secondary | ICD-10-CM | POA: Diagnosis not present

## 2020-04-05 DIAGNOSIS — M7989 Other specified soft tissue disorders: Secondary | ICD-10-CM | POA: Diagnosis not present

## 2020-04-05 DIAGNOSIS — R7881 Bacteremia: Secondary | ICD-10-CM | POA: Diagnosis not present

## 2020-04-05 DIAGNOSIS — B9561 Methicillin susceptible Staphylococcus aureus infection as the cause of diseases classified elsewhere: Secondary | ICD-10-CM | POA: Diagnosis not present

## 2020-04-05 DIAGNOSIS — D473 Essential (hemorrhagic) thrombocythemia: Secondary | ICD-10-CM | POA: Diagnosis not present

## 2020-04-05 DIAGNOSIS — A4901 Methicillin susceptible Staphylococcus aureus infection, unspecified site: Secondary | ICD-10-CM | POA: Diagnosis not present

## 2020-04-05 DIAGNOSIS — G062 Extradural and subdural abscess, unspecified: Secondary | ICD-10-CM | POA: Diagnosis not present

## 2020-04-06 ENCOUNTER — Telehealth: Payer: Self-pay

## 2020-04-06 DIAGNOSIS — A4901 Methicillin susceptible Staphylococcus aureus infection, unspecified site: Secondary | ICD-10-CM | POA: Diagnosis not present

## 2020-04-06 DIAGNOSIS — B9561 Methicillin susceptible Staphylococcus aureus infection as the cause of diseases classified elsewhere: Secondary | ICD-10-CM | POA: Diagnosis not present

## 2020-04-06 DIAGNOSIS — G062 Extradural and subdural abscess, unspecified: Secondary | ICD-10-CM | POA: Diagnosis not present

## 2020-04-06 DIAGNOSIS — R7881 Bacteremia: Secondary | ICD-10-CM | POA: Diagnosis not present

## 2020-04-06 NOTE — Telephone Encounter (Signed)
Lets pull picc and patient can start doxycycline 100mg  po bid with 30 day supply and 3 refills

## 2020-04-06 NOTE — Telephone Encounter (Signed)
Patient called office today with question regarding end date for IV antibiotics. Would like to know if he done with antibiotics on 4/21 since he only has one more dose. Will forward message to MD to advise on orders for IV antibiotics Aundria Rud, [place]

## 2020-04-07 ENCOUNTER — Other Ambulatory Visit: Payer: Self-pay

## 2020-04-07 DIAGNOSIS — Z452 Encounter for adjustment and management of vascular access device: Secondary | ICD-10-CM | POA: Diagnosis not present

## 2020-04-07 DIAGNOSIS — Z9181 History of falling: Secondary | ICD-10-CM | POA: Diagnosis not present

## 2020-04-07 DIAGNOSIS — M4620 Osteomyelitis of vertebra, site unspecified: Secondary | ICD-10-CM

## 2020-04-07 DIAGNOSIS — M7520 Bicipital tendinitis, unspecified shoulder: Secondary | ICD-10-CM | POA: Diagnosis not present

## 2020-04-07 DIAGNOSIS — G20 Parkinson's disease: Secondary | ICD-10-CM | POA: Diagnosis not present

## 2020-04-07 DIAGNOSIS — I2510 Atherosclerotic heart disease of native coronary artery without angina pectoris: Secondary | ICD-10-CM | POA: Diagnosis not present

## 2020-04-07 DIAGNOSIS — J439 Emphysema, unspecified: Secondary | ICD-10-CM | POA: Diagnosis not present

## 2020-04-07 DIAGNOSIS — G061 Intraspinal abscess and granuloma: Secondary | ICD-10-CM | POA: Diagnosis not present

## 2020-04-07 DIAGNOSIS — I700 Atherosclerosis of aorta: Secondary | ICD-10-CM | POA: Diagnosis not present

## 2020-04-07 DIAGNOSIS — B9561 Methicillin susceptible Staphylococcus aureus infection as the cause of diseases classified elsewhere: Secondary | ICD-10-CM | POA: Diagnosis not present

## 2020-04-07 DIAGNOSIS — M1711 Unilateral primary osteoarthritis, right knee: Secondary | ICD-10-CM | POA: Diagnosis not present

## 2020-04-07 DIAGNOSIS — Z466 Encounter for fitting and adjustment of urinary device: Secondary | ICD-10-CM | POA: Diagnosis not present

## 2020-04-07 DIAGNOSIS — T8149XA Infection following a procedure, other surgical site, initial encounter: Secondary | ICD-10-CM | POA: Diagnosis not present

## 2020-04-07 DIAGNOSIS — Z792 Long term (current) use of antibiotics: Secondary | ICD-10-CM | POA: Diagnosis not present

## 2020-04-07 DIAGNOSIS — G8929 Other chronic pain: Secondary | ICD-10-CM | POA: Diagnosis not present

## 2020-04-07 DIAGNOSIS — M4804 Spinal stenosis, thoracic region: Secondary | ICD-10-CM | POA: Diagnosis not present

## 2020-04-07 MED ORDER — DOXYCYCLINE HYCLATE 100 MG PO TABS: 100.0000 mg | ORAL_TABLET | Freq: Two times a day (BID) | ORAL | 3 refills | Status: AC

## 2020-04-07 NOTE — Telephone Encounter (Signed)
Called [facility] with verbal order to pull picc. Spoke with Jackelyn Poling who will relay orders to nursing.  Patient was notified that pull picc orders have been given. Also that he will be starting oral antibiotics. Sent prescription to preferred pharmacy. Patient does not have any questions at this time. [place]

## 2020-04-09 ENCOUNTER — Telehealth: Payer: Self-pay | Admitting: *Deleted

## 2020-04-09 DIAGNOSIS — M7520 Bicipital tendinitis, unspecified shoulder: Secondary | ICD-10-CM | POA: Diagnosis not present

## 2020-04-09 DIAGNOSIS — Z9181 History of falling: Secondary | ICD-10-CM | POA: Diagnosis not present

## 2020-04-09 DIAGNOSIS — I700 Atherosclerosis of aorta: Secondary | ICD-10-CM | POA: Diagnosis not present

## 2020-04-09 DIAGNOSIS — G061 Intraspinal abscess and granuloma: Secondary | ICD-10-CM | POA: Diagnosis not present

## 2020-04-09 DIAGNOSIS — Z466 Encounter for fitting and adjustment of urinary device: Secondary | ICD-10-CM | POA: Diagnosis not present

## 2020-04-09 DIAGNOSIS — T8149XA Infection following a procedure, other surgical site, initial encounter: Secondary | ICD-10-CM | POA: Diagnosis not present

## 2020-04-09 DIAGNOSIS — G20 Parkinson's disease: Secondary | ICD-10-CM | POA: Diagnosis not present

## 2020-04-09 DIAGNOSIS — M4804 Spinal stenosis, thoracic region: Secondary | ICD-10-CM | POA: Diagnosis not present

## 2020-04-09 DIAGNOSIS — I2510 Atherosclerotic heart disease of native coronary artery without angina pectoris: Secondary | ICD-10-CM | POA: Diagnosis not present

## 2020-04-09 DIAGNOSIS — Z452 Encounter for adjustment and management of vascular access device: Secondary | ICD-10-CM | POA: Diagnosis not present

## 2020-04-09 DIAGNOSIS — B9561 Methicillin susceptible Staphylococcus aureus infection as the cause of diseases classified elsewhere: Secondary | ICD-10-CM | POA: Diagnosis not present

## 2020-04-09 DIAGNOSIS — J439 Emphysema, unspecified: Secondary | ICD-10-CM | POA: Diagnosis not present

## 2020-04-09 DIAGNOSIS — Z792 Long term (current) use of antibiotics: Secondary | ICD-10-CM | POA: Diagnosis not present

## 2020-04-09 DIAGNOSIS — M1711 Unilateral primary osteoarthritis, right knee: Secondary | ICD-10-CM | POA: Diagnosis not present

## 2020-04-09 DIAGNOSIS — G8929 Other chronic pain: Secondary | ICD-10-CM | POA: Diagnosis not present

## 2020-04-09 NOTE — Telephone Encounter (Signed)
-----   Message from Kent Hayward, MD sent at 04/05/2020  8:38 AM EDT ----- MRI is reviewed he should stay on his antibiotics and he should be seeing me shortly.  Does not look like there is anything for neurosurgery to intervene upon

## 2020-04-09 NOTE — Telephone Encounter (Signed)
Relayed results to patient. He is starting oral antibiotics (doxy) today, will have PICC pulled by home health. Landis Gandy, RN

## 2020-04-14 ENCOUNTER — Encounter: Payer: Self-pay | Admitting: Infectious Disease

## 2020-04-14 DIAGNOSIS — M4804 Spinal stenosis, thoracic region: Secondary | ICD-10-CM | POA: Diagnosis not present

## 2020-04-14 DIAGNOSIS — B9561 Methicillin susceptible Staphylococcus aureus infection as the cause of diseases classified elsewhere: Secondary | ICD-10-CM | POA: Diagnosis not present

## 2020-04-14 DIAGNOSIS — G20 Parkinson's disease: Secondary | ICD-10-CM | POA: Diagnosis not present

## 2020-04-14 DIAGNOSIS — Z452 Encounter for adjustment and management of vascular access device: Secondary | ICD-10-CM | POA: Diagnosis not present

## 2020-04-14 DIAGNOSIS — Z466 Encounter for fitting and adjustment of urinary device: Secondary | ICD-10-CM | POA: Diagnosis not present

## 2020-04-14 DIAGNOSIS — T8149XA Infection following a procedure, other surgical site, initial encounter: Secondary | ICD-10-CM | POA: Diagnosis not present

## 2020-04-14 DIAGNOSIS — M1711 Unilateral primary osteoarthritis, right knee: Secondary | ICD-10-CM | POA: Diagnosis not present

## 2020-04-14 DIAGNOSIS — J439 Emphysema, unspecified: Secondary | ICD-10-CM | POA: Diagnosis not present

## 2020-04-14 DIAGNOSIS — I700 Atherosclerosis of aorta: Secondary | ICD-10-CM | POA: Diagnosis not present

## 2020-04-14 DIAGNOSIS — Z9181 History of falling: Secondary | ICD-10-CM | POA: Diagnosis not present

## 2020-04-14 DIAGNOSIS — I2510 Atherosclerotic heart disease of native coronary artery without angina pectoris: Secondary | ICD-10-CM | POA: Diagnosis not present

## 2020-04-14 DIAGNOSIS — Z792 Long term (current) use of antibiotics: Secondary | ICD-10-CM | POA: Diagnosis not present

## 2020-04-14 DIAGNOSIS — G061 Intraspinal abscess and granuloma: Secondary | ICD-10-CM | POA: Diagnosis not present

## 2020-04-14 DIAGNOSIS — G8929 Other chronic pain: Secondary | ICD-10-CM | POA: Diagnosis not present

## 2020-04-14 DIAGNOSIS — M7520 Bicipital tendinitis, unspecified shoulder: Secondary | ICD-10-CM | POA: Diagnosis not present

## 2020-04-16 DIAGNOSIS — E559 Vitamin D deficiency, unspecified: Secondary | ICD-10-CM | POA: Diagnosis not present

## 2020-04-16 DIAGNOSIS — I10 Essential (primary) hypertension: Secondary | ICD-10-CM | POA: Diagnosis not present

## 2020-04-16 DIAGNOSIS — Z79899 Other long term (current) drug therapy: Secondary | ICD-10-CM | POA: Diagnosis not present

## 2020-04-16 DIAGNOSIS — R8290 Unspecified abnormal findings in urine: Secondary | ICD-10-CM | POA: Diagnosis not present

## 2020-04-16 DIAGNOSIS — E612 Magnesium deficiency: Secondary | ICD-10-CM | POA: Diagnosis not present

## 2020-04-16 DIAGNOSIS — R600 Localized edema: Secondary | ICD-10-CM | POA: Diagnosis not present

## 2020-04-16 DIAGNOSIS — Z1159 Encounter for screening for other viral diseases: Secondary | ICD-10-CM | POA: Diagnosis not present

## 2020-04-21 DIAGNOSIS — Z792 Long term (current) use of antibiotics: Secondary | ICD-10-CM | POA: Diagnosis not present

## 2020-04-21 DIAGNOSIS — B9561 Methicillin susceptible Staphylococcus aureus infection as the cause of diseases classified elsewhere: Secondary | ICD-10-CM | POA: Diagnosis not present

## 2020-04-21 DIAGNOSIS — Z466 Encounter for fitting and adjustment of urinary device: Secondary | ICD-10-CM | POA: Diagnosis not present

## 2020-04-21 DIAGNOSIS — M7520 Bicipital tendinitis, unspecified shoulder: Secondary | ICD-10-CM | POA: Diagnosis not present

## 2020-04-21 DIAGNOSIS — J439 Emphysema, unspecified: Secondary | ICD-10-CM | POA: Diagnosis not present

## 2020-04-21 DIAGNOSIS — M4804 Spinal stenosis, thoracic region: Secondary | ICD-10-CM | POA: Diagnosis not present

## 2020-04-21 DIAGNOSIS — I2510 Atherosclerotic heart disease of native coronary artery without angina pectoris: Secondary | ICD-10-CM | POA: Diagnosis not present

## 2020-04-21 DIAGNOSIS — T8149XA Infection following a procedure, other surgical site, initial encounter: Secondary | ICD-10-CM | POA: Diagnosis not present

## 2020-04-21 DIAGNOSIS — G061 Intraspinal abscess and granuloma: Secondary | ICD-10-CM | POA: Diagnosis not present

## 2020-04-21 DIAGNOSIS — Z452 Encounter for adjustment and management of vascular access device: Secondary | ICD-10-CM | POA: Diagnosis not present

## 2020-04-21 DIAGNOSIS — G20 Parkinson's disease: Secondary | ICD-10-CM | POA: Diagnosis not present

## 2020-04-21 DIAGNOSIS — G8929 Other chronic pain: Secondary | ICD-10-CM | POA: Diagnosis not present

## 2020-04-21 DIAGNOSIS — I700 Atherosclerosis of aorta: Secondary | ICD-10-CM | POA: Diagnosis not present

## 2020-04-21 DIAGNOSIS — M1711 Unilateral primary osteoarthritis, right knee: Secondary | ICD-10-CM | POA: Diagnosis not present

## 2020-04-21 DIAGNOSIS — Z9181 History of falling: Secondary | ICD-10-CM | POA: Diagnosis not present

## 2020-04-23 DIAGNOSIS — Z79899 Other long term (current) drug therapy: Secondary | ICD-10-CM | POA: Diagnosis not present

## 2020-04-23 DIAGNOSIS — M25561 Pain in right knee: Secondary | ICD-10-CM | POA: Diagnosis not present

## 2020-04-23 DIAGNOSIS — G8929 Other chronic pain: Secondary | ICD-10-CM | POA: Diagnosis not present

## 2020-04-23 DIAGNOSIS — M25562 Pain in left knee: Secondary | ICD-10-CM | POA: Diagnosis not present

## 2020-04-26 ENCOUNTER — Other Ambulatory Visit: Payer: Self-pay | Admitting: *Deleted

## 2020-04-26 NOTE — Patient Outreach (Signed)
[facility]) Care Management  04/26/2020  Dawayne Ohair [facility] [birth year] [id number]   Call placed to member to follow up on recovery from infection.  State he does not have much time to talk as he is at a restaurant waiting for his lunch to arrive.  He does confirm that he has completed his IV antibiotic course and has now switched to PO.  State the PICC line was removed but the home health nurse is still coming out weekly, but may be stopping soon.  He has follow up with ID specialist on 5/24.  Denies any urgent concerns at this time, encouraged to contact this care manager with questions.  [facility] CM Care Plan Problem Two     Most Recent Value  Care Plan Problem Two  Knowledge deficit regarding management of chronic medical conditions  Role Documenting the Problem Two  Care Management Coordinator  Care Plan for Problem Two  Active  Interventions for Problem Two Long Term Goal   Member educated on importance of taking PO antibiotics to complete transition from IV as well as to continue optimal converage for infection  THN Long Term Goal  Member will be free from infection within the next 45 days  THN Long Term Goal Start Date  03/31/20  [facility] CM Short Term Goal #1   Member will have follow up appointments with PCP and ortho specialist within the next 4 weeks  THN CM Short Term Goal #1 Start Date  03/31/20  [facility] CM Short Term Goal #1 Met Date   04/26/20  THN CM Short Term Goal #2   Member will report plan for extremity edema within the next 2 weeks  THN CM Short Term Goal #2 Start Date  03/31/20  [facility] CM Short Term Goal #2 Met Date  04/26/20      Valente David, RN, MSN [place] [phone]

## 2020-04-30 ENCOUNTER — Ambulatory Visit: Payer: Medicare Other | Admitting: *Deleted

## 2020-05-04 DIAGNOSIS — R310 Gross hematuria: Secondary | ICD-10-CM | POA: Diagnosis not present

## 2020-05-04 DIAGNOSIS — M7989 Other specified soft tissue disorders: Secondary | ICD-10-CM | POA: Diagnosis not present

## 2020-05-04 DIAGNOSIS — R3914 Feeling of incomplete bladder emptying: Secondary | ICD-10-CM | POA: Diagnosis not present

## 2020-05-04 DIAGNOSIS — D473 Essential (hemorrhagic) thrombocythemia: Secondary | ICD-10-CM | POA: Diagnosis not present

## 2020-05-04 DIAGNOSIS — N319 Neuromuscular dysfunction of bladder, unspecified: Secondary | ICD-10-CM | POA: Diagnosis not present

## 2020-05-10 ENCOUNTER — Ambulatory Visit (INDEPENDENT_AMBULATORY_CARE_PROVIDER_SITE_OTHER): Payer: Medicare Other | Admitting: Infectious Disease

## 2020-05-10 ENCOUNTER — Encounter: Payer: Self-pay | Admitting: Infectious Disease

## 2020-05-10 ENCOUNTER — Other Ambulatory Visit: Payer: Self-pay

## 2020-05-10 VITALS — BP 156/78 | HR 93 | Temp 99.4°F | Wt 167.0 lb

## 2020-05-10 DIAGNOSIS — R42 Dizziness and giddiness: Secondary | ICD-10-CM

## 2020-05-10 DIAGNOSIS — M4620 Osteomyelitis of vertebra, site unspecified: Secondary | ICD-10-CM | POA: Diagnosis not present

## 2020-05-10 DIAGNOSIS — G061 Intraspinal abscess and granuloma: Secondary | ICD-10-CM | POA: Diagnosis not present

## 2020-05-10 DIAGNOSIS — Z9889 Other specified postprocedural states: Secondary | ICD-10-CM | POA: Diagnosis not present

## 2020-05-10 DIAGNOSIS — M4643 Discitis, unspecified, cervicothoracic region: Secondary | ICD-10-CM

## 2020-05-10 DIAGNOSIS — M4640 Discitis, unspecified, site unspecified: Secondary | ICD-10-CM

## 2020-05-10 HISTORY — DX: Discitis, unspecified, site unspecified: M46.40

## 2020-05-10 HISTORY — DX: Dizziness and giddiness: R42

## 2020-05-10 MED ORDER — DOXYCYCLINE HYCLATE 100 MG PO TABS: 100.0000 mg | ORAL_TABLET | Freq: Two times a day (BID) | ORAL | 4 refills | Status: DC
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
## 2020-05-11 LAB — CBC WITH DIFFERENTIAL/PLATELET
Absolute Monocytes: 828 cells/uL (ref 200–950)
Basophils Absolute: 46 cells/uL (ref 0–200)
Basophils Relative: 0.4 %
Eosinophils Absolute: 207 cells/uL (ref 15–500)
Eosinophils Relative: 1.8 %
HCT: 33.5 % — ABNORMAL LOW (ref 38.5–50.0)
Hemoglobin: 11 g/dL — ABNORMAL LOW (ref 13.2–17.1)
Lymphs Abs: 2726 cells/uL (ref 850–3900)
MCH: 31.1 pg (ref 27.0–33.0)
MCHC: 32.8 g/dL (ref 32.0–36.0)
MCV: 94.6 fL (ref 80.0–100.0)
MPV: 9.1 fL (ref 7.5–12.5)
Monocytes Relative: 7.2 %
Neutro Abs: 7694 cells/uL (ref 1500–7800)
Neutrophils Relative %: 66.9 %
Platelets: 404 10*3/uL — ABNORMAL HIGH (ref 140–400)
RBC: 3.54 10*6/uL — ABNORMAL LOW (ref 4.20–5.80)
RDW: 12.4 % (ref 11.0–15.0)
Total Lymphocyte: 23.7 %
WBC: 11.5 10*3/uL — ABNORMAL HIGH (ref 3.8–10.8)

## 2020-05-11 LAB — BASIC METABOLIC PANEL WITH GFR
BUN/Creatinine Ratio: 10 (calc) (ref 6–22)
BUN: 13 mg/dL (ref 7–25)
CO2: 32 mmol/L (ref 20–32)
Calcium: 9.4 mg/dL (ref 8.6–10.3)
Chloride: 101 mmol/L (ref 98–110)
Creat: 1.33 mg/dL — ABNORMAL HIGH (ref 0.70–1.18)
GFR, Est African American: 61 mL/min/{1.73_m2} (ref >=60)
GFR, Est Non African American: 53 mL/min/{1.73_m2} — ABNORMAL LOW (ref >=60)
Glucose, Bld: 125 mg/dL — ABNORMAL HIGH (ref 65–99)
Potassium: 4.4 mmol/L (ref 3.5–5.3)
Sodium: 140 mmol/L (ref 135–146)

## 2020-05-11 LAB — C-REACTIVE PROTEIN: CRP: 41.2 mg/L — ABNORMAL HIGH (ref <8.0)

## 2020-05-11 LAB — SEDIMENTATION RATE: Sed Rate: 75 mm/h — ABNORMAL HIGH (ref 0–20)

## 2020-05-21 DIAGNOSIS — M25562 Pain in left knee: Secondary | ICD-10-CM | POA: Diagnosis not present

## 2020-05-21 DIAGNOSIS — G8929 Other chronic pain: Secondary | ICD-10-CM | POA: Diagnosis not present

## 2020-05-21 DIAGNOSIS — M25561 Pain in right knee: Secondary | ICD-10-CM | POA: Diagnosis not present

## 2020-05-21 DIAGNOSIS — Z79899 Other long term (current) drug therapy: Secondary | ICD-10-CM | POA: Diagnosis not present

## 2020-05-24 ENCOUNTER — Other Ambulatory Visit: Payer: Self-pay | Admitting: *Deleted

## 2020-05-24 NOTE — Patient Outreach (Addendum)
[facility]) Care Management  05/24/2020  Kent Flowers [facility] [birth year] [id number]   Call placed to member to follow up on continued recovery from infection, no answer.  HIPAA compliant voice message left, will follow up within the next 3-5 business days.    Update:  Call received back from member, state he is doing "much better."  Feel he is progressing well, state his balance has improved and walking with more stability.  Denies any signs/symptoms of infections but state he is still taking oral antibiotics.  Was seen by ID on 5/24, will have follow up in August.  Will see PCP in the next couple weeks.  Denies any urgent concerns, will follow up within the next month.  If remain stable, will consider transition to health coach.  THN CM Care Plan Problem Two     Most Recent Value  Care Plan Problem Two  Knowledge deficit regarding management of chronic medical conditions  Role Documenting the Problem Two  Care Management Coordinator  Care Plan for Problem Two  Active  THN Long Term Goal  Member will be free from infection within the next 45 days  THN Long Term Goal Start Date  03/31/20  [facility] Long Term Goal Met Date  05/24/20       Kent David, RN, MSN [place] [phone]

## 2020-05-27 DIAGNOSIS — J449 Chronic obstructive pulmonary disease, unspecified: Secondary | ICD-10-CM | POA: Diagnosis not present

## 2020-05-27 DIAGNOSIS — I10 Essential (primary) hypertension: Secondary | ICD-10-CM | POA: Diagnosis not present

## 2020-05-27 DIAGNOSIS — R8290 Unspecified abnormal findings in urine: Secondary | ICD-10-CM | POA: Diagnosis not present

## 2020-05-27 DIAGNOSIS — Z0289 Encounter for other administrative examinations: Secondary | ICD-10-CM | POA: Diagnosis not present

## 2020-05-28 DIAGNOSIS — R310 Gross hematuria: Secondary | ICD-10-CM | POA: Diagnosis not present

## 2020-05-28 DIAGNOSIS — N35011 Post-traumatic bulbous urethral stricture: Secondary | ICD-10-CM | POA: Diagnosis not present

## 2020-05-28 DIAGNOSIS — N3000 Acute cystitis without hematuria: Secondary | ICD-10-CM | POA: Diagnosis not present

## 2020-05-31 ENCOUNTER — Ambulatory Visit: Payer: Self-pay | Admitting: *Deleted

## 2020-06-10 DIAGNOSIS — D473 Essential (hemorrhagic) thrombocythemia: Secondary | ICD-10-CM | POA: Diagnosis not present

## 2020-06-10 DIAGNOSIS — M7989 Other specified soft tissue disorders: Secondary | ICD-10-CM | POA: Diagnosis not present

## 2020-06-17 DIAGNOSIS — Z79899 Other long term (current) drug therapy: Secondary | ICD-10-CM | POA: Diagnosis not present

## 2020-06-17 DIAGNOSIS — M25562 Pain in left knee: Secondary | ICD-10-CM | POA: Diagnosis not present

## 2020-06-17 DIAGNOSIS — M25561 Pain in right knee: Secondary | ICD-10-CM | POA: Diagnosis not present

## 2020-06-17 DIAGNOSIS — R072 Precordial pain: Secondary | ICD-10-CM | POA: Diagnosis not present

## 2020-06-17 DIAGNOSIS — G8929 Other chronic pain: Secondary | ICD-10-CM | POA: Diagnosis not present

## 2020-06-22 ENCOUNTER — Other Ambulatory Visit: Payer: Self-pay | Admitting: *Deleted

## 2020-06-22 NOTE — Patient Outreach (Signed)
[place] [facility]) Care Management  06/22/2020  Raford Brissett [facility] 05-01-1946 [id number]    Monthly call placed to member to follow up on management of his chronic conditions.  He report he is doing well, feel his recovery is taking longer than he expected.  He is confident that he is improving, state his physicians feel the same.  Report he feels recovery has been slow due to his chronic knee problems, state they are "bone on bone" but he hasn't been able to have any interventions because of the recurrent vertebral infection he is recovering from. He has not had any signs/symptoms of infection and will continue to follow up with ID, next visit scheduled for 8/10.  He remain on antibiotics.    Report he has been having swelling in his legs, which is not new but has been consistent over the past weeks.  He is taking Lasix, but only 3 times a week.  This was prescribed by PCP, whom he has follow up with next week.  He will discuss with her at that time and inquire about increasing frequency  This has not stopped him from performing daily activities, denies any urgent concerns.  Will follow up within the next month.    Goals Addressed              This Visit's Progress   .  Patient Stated: I want to get rid of this swelling in my legs (pt-stated)        CARE PLAN ENTRY (see longitudinal plan of care for additional care plan information)  Current Barriers:  Marland Kitchen Knowledge Deficits related to low sodium management  Nurse Case Manager Clinical Goal(s):  Marland Kitchen Over the next 31 days, patient will work with PCP to address needs related to extremity swelling . Over the next 28 days days, patient will attend all scheduled medical appointments: PCP . Over the next 28 days days, patient will demonstrate improved adherence to prescribed treatment plan for extremity swelling as evidenced bymedication and diet adherence  Interventions:  . Inter-disciplinary care team collaboration (see longitudinal  plan of care) . Advised patient to discuss swelling with PCP and inquire about increasing frequency of Lasix . Reviewed medications with patient and discussed importance of taking as instructed . Discussed plans with patient for ongoing care management follow up and provided patient with direct contact information for care management team . Reviewed scheduled/upcoming provider appointments including:  . Advised patient, providing education and rationale, to weigh daily and record, calling PCP for weight gain of 3lbs overnight or 5 pounds in a week.   Patient Self Care Activities:  . Self administers medications as prescribed . Attends all scheduled provider appointments . Performs ADL's independently   Initial goal documentation       Valente David, RN, MSN [place] [phone]

## 2020-06-24 DIAGNOSIS — H52203 Unspecified astigmatism, bilateral: Secondary | ICD-10-CM | POA: Diagnosis not present

## 2020-06-24 DIAGNOSIS — H5203 Hypermetropia, bilateral: Secondary | ICD-10-CM | POA: Diagnosis not present

## 2020-06-24 DIAGNOSIS — H2513 Age-related nuclear cataract, bilateral: Secondary | ICD-10-CM | POA: Diagnosis not present

## 2020-06-29 DIAGNOSIS — Z0289 Encounter for other administrative examinations: Secondary | ICD-10-CM | POA: Diagnosis not present

## 2020-06-29 DIAGNOSIS — R600 Localized edema: Secondary | ICD-10-CM | POA: Diagnosis not present

## 2020-06-29 DIAGNOSIS — J449 Chronic obstructive pulmonary disease, unspecified: Secondary | ICD-10-CM | POA: Diagnosis not present

## 2020-06-29 DIAGNOSIS — I10 Essential (primary) hypertension: Secondary | ICD-10-CM | POA: Diagnosis not present

## 2020-07-15 DIAGNOSIS — M25561 Pain in right knee: Secondary | ICD-10-CM | POA: Diagnosis not present

## 2020-07-15 DIAGNOSIS — M25562 Pain in left knee: Secondary | ICD-10-CM | POA: Diagnosis not present

## 2020-07-15 DIAGNOSIS — Z79899 Other long term (current) drug therapy: Secondary | ICD-10-CM | POA: Diagnosis not present

## 2020-07-15 DIAGNOSIS — G8929 Other chronic pain: Secondary | ICD-10-CM | POA: Diagnosis not present

## 2020-07-20 ENCOUNTER — Other Ambulatory Visit: Payer: Self-pay | Admitting: *Deleted

## 2020-07-20 NOTE — Patient Outreach (Signed)
[facility] [facility]) Care Management  07/20/2020  Kent Flowers [facility] October 30, 1946 [id number]   Call placed to member to follow up on management of chronic medical conditions and recent PCP visit.  State he is still having some swelling in the legs but report it is "stable."  Denies any shortness of breath or chest discomfort.  Confirms that he did have appointment with PCP a couple weeks ago, unable to remember if he discussed increasing frequency of Lasix, state his wife manages his medications most of the time.  He has another visit tomorrow (annual wellness visit), he will discuss leg swelling at that time.  Reminded to monitor sodium in his diet.  Denies any urgent concerns, encouraged to contact this care manager with questions.  Agrees to follow up within the next month.  Goals Addressed              This Visit's Progress   .  Patient Stated: I want to get rid of this swelling in my legs (pt-stated)   On track     [place] (see longitudinal plan of care for additional care plan information)  Current Barriers:  Marland Kitchen Knowledge Deficits related to low sodium management  Nurse Case Manager Clinical Goal(s):  Marland Kitchen Over the next 31 days, patient will work with PCP to address needs related to extremity swelling . Over the next 28 days days, patient will attend all scheduled medical appointments: PCP . Over the next 28 days days, patient will demonstrate improved adherence to prescribed treatment plan for extremity swelling as evidenced bymedication and diet adherence  Interventions:  . Inter-disciplinary care team collaboration (see longitudinal plan of care) . Advised patient to discuss swelling with PCP and inquire about increasing frequency of Lasix . Reviewed medications with patient and discussed importance of taking as instructed . Discussed plans with patient for ongoing care management follow up and provided patient with direct contact information for care management  team . Reviewed scheduled/upcoming provider appointments including:  . Advised patient, providing education and rationale, to weigh daily and record, calling PCP for weight gain of 3lbs overnight or 5 pounds in a week.   Patient Self Care Activities:  . Self administers medications as prescribed . Attends all scheduled provider appointments . Performs ADL's independently          Valente David, RN, MSN [place] [phone]

## 2020-07-21 DIAGNOSIS — Z131 Encounter for screening for diabetes mellitus: Secondary | ICD-10-CM | POA: Diagnosis not present

## 2020-07-21 DIAGNOSIS — E559 Vitamin D deficiency, unspecified: Secondary | ICD-10-CM | POA: Diagnosis not present

## 2020-07-21 DIAGNOSIS — J449 Chronic obstructive pulmonary disease, unspecified: Secondary | ICD-10-CM | POA: Diagnosis not present

## 2020-07-21 DIAGNOSIS — R5383 Other fatigue: Secondary | ICD-10-CM | POA: Diagnosis not present

## 2020-07-21 DIAGNOSIS — Z79899 Other long term (current) drug therapy: Secondary | ICD-10-CM | POA: Diagnosis not present

## 2020-07-21 DIAGNOSIS — E7800 Pure hypercholesterolemia, unspecified: Secondary | ICD-10-CM | POA: Diagnosis not present

## 2020-07-21 DIAGNOSIS — Z1159 Encounter for screening for other viral diseases: Secondary | ICD-10-CM | POA: Diagnosis not present

## 2020-07-21 DIAGNOSIS — R0602 Shortness of breath: Secondary | ICD-10-CM | POA: Diagnosis not present

## 2020-07-21 DIAGNOSIS — Z13220 Encounter for screening for lipoid disorders: Secondary | ICD-10-CM | POA: Diagnosis not present

## 2020-07-21 DIAGNOSIS — Z0000 Encounter for general adult medical examination without abnormal findings: Secondary | ICD-10-CM | POA: Diagnosis not present

## 2020-07-21 DIAGNOSIS — Z20822 Contact with and (suspected) exposure to covid-19: Secondary | ICD-10-CM | POA: Diagnosis not present

## 2020-07-21 DIAGNOSIS — I10 Essential (primary) hypertension: Secondary | ICD-10-CM | POA: Diagnosis not present

## 2020-07-21 DIAGNOSIS — R8290 Unspecified abnormal findings in urine: Secondary | ICD-10-CM | POA: Diagnosis not present

## 2020-07-22 DIAGNOSIS — M25561 Pain in right knee: Secondary | ICD-10-CM | POA: Diagnosis not present

## 2020-07-22 DIAGNOSIS — M1711 Unilateral primary osteoarthritis, right knee: Secondary | ICD-10-CM | POA: Diagnosis not present

## 2020-07-22 DIAGNOSIS — M1712 Unilateral primary osteoarthritis, left knee: Secondary | ICD-10-CM | POA: Diagnosis not present

## 2020-07-22 DIAGNOSIS — M25562 Pain in left knee: Secondary | ICD-10-CM | POA: Diagnosis not present

## 2020-07-27 ENCOUNTER — Ambulatory Visit: Payer: Medicare Other | Admitting: Infectious Disease

## 2020-08-11 DIAGNOSIS — L570 Actinic keratosis: Secondary | ICD-10-CM | POA: Diagnosis not present

## 2020-08-11 DIAGNOSIS — C4442 Squamous cell carcinoma of skin of scalp and neck: Secondary | ICD-10-CM | POA: Diagnosis not present

## 2020-08-12 DIAGNOSIS — G8929 Other chronic pain: Secondary | ICD-10-CM | POA: Diagnosis not present

## 2020-08-12 DIAGNOSIS — M25561 Pain in right knee: Secondary | ICD-10-CM | POA: Diagnosis not present

## 2020-08-12 DIAGNOSIS — M25562 Pain in left knee: Secondary | ICD-10-CM | POA: Diagnosis not present

## 2020-08-12 DIAGNOSIS — Z79899 Other long term (current) drug therapy: Secondary | ICD-10-CM | POA: Diagnosis not present

## 2020-08-19 ENCOUNTER — Other Ambulatory Visit: Payer: Self-pay | Admitting: *Deleted

## 2020-08-19 NOTE — Patient Outreach (Addendum)
[facility]) Care Management  08/19/2020  Caisen Mangas [facility] 19-Sep-1946 [id number]   Call placed to member to follow up on management of chronic medical conditions as well as recent PCP visit.  No answer, HIPAA complaint voice message left.  Will follow up within the next 3-4 business days.    Update:  Incoming call received from member, state he is doing better. His swelling has decreased but still present.  He was seen by his PCP, denies any issues  Has follow up appointment with ID on 9/15.  He has not had any ongoing signs/symptoms of infection.  Appreciates ongoing calls, denies any urgent concerns.  Encouraged to contact this care manager with questions.  Will follow up within the next month.  Goals Addressed              This Visit's Progress   .  COMPLETED: Patient Stated: I want to get rid of this swelling in my legs (pt-stated)        CARE PLAN ENTRY (see longitudinal plan of care for additional care plan information)  Current Barriers:  Marland Kitchen Knowledge Deficits related to low sodium management  Nurse Case Manager Clinical Goal(s):  Marland Kitchen Over the next 31 days, patient will work with PCP to address needs related to extremity swelling . Over the next 28 days days, patient will attend all scheduled medical appointments: PCP . Over the next 28 days days, patient will demonstrate improved adherence to prescribed treatment plan for extremity swelling as evidenced bymedication and diet adherence  Interventions:  . Inter-disciplinary care team collaboration (see longitudinal plan of care) . Advised patient to discuss swelling with PCP and inquire about increasing frequency of Lasix . Reviewed medications with patient and discussed importance of taking as instructed . Discussed plans with patient for ongoing care management follow up and provided patient with direct contact information for care management team . Reviewed scheduled/upcoming provider appointments  including:  . Advised patient, providing education and rationale, to weigh daily and record, calling PCP for weight gain of 3lbs overnight or 5 pounds in a week.   Patient Self Care Activities:  . Self administers medications as prescribed . Attends all scheduled provider appointments . Performs ADL's independently           Valente David, RN, MSN [place] [phone]

## 2020-08-24 DIAGNOSIS — M25561 Pain in right knee: Secondary | ICD-10-CM | POA: Diagnosis not present

## 2020-08-24 DIAGNOSIS — M170 Bilateral primary osteoarthritis of knee: Secondary | ICD-10-CM | POA: Diagnosis not present

## 2020-08-24 DIAGNOSIS — M25562 Pain in left knee: Secondary | ICD-10-CM | POA: Diagnosis not present

## 2020-08-25 ENCOUNTER — Ambulatory Visit: Payer: Self-pay | Admitting: *Deleted

## 2020-08-31 ENCOUNTER — Telehealth: Payer: Self-pay

## 2020-08-31 DIAGNOSIS — M25561 Pain in right knee: Secondary | ICD-10-CM | POA: Diagnosis not present

## 2020-08-31 DIAGNOSIS — M170 Bilateral primary osteoarthritis of knee: Secondary | ICD-10-CM | POA: Diagnosis not present

## 2020-08-31 DIAGNOSIS — I8312 Varicose veins of left lower extremity with inflammation: Secondary | ICD-10-CM | POA: Diagnosis not present

## 2020-08-31 DIAGNOSIS — I8311 Varicose veins of right lower extremity with inflammation: Secondary | ICD-10-CM | POA: Diagnosis not present

## 2020-08-31 DIAGNOSIS — M25562 Pain in left knee: Secondary | ICD-10-CM | POA: Diagnosis not present

## 2020-08-31 DIAGNOSIS — I872 Venous insufficiency (chronic) (peripheral): Secondary | ICD-10-CM | POA: Diagnosis not present

## 2020-08-31 DIAGNOSIS — D0439 Carcinoma in situ of skin of other parts of face: Secondary | ICD-10-CM | POA: Diagnosis not present

## 2020-08-31 NOTE — Telephone Encounter (Signed)
COVID-19 Pre-Screening Questions:08/31/20  Do you currently have a fever (>100 F), chills or unexplained body aches? NO   Are you currently experiencing new cough, shortness of breath, sore throat, runny nose? NO  Have you been in contact with someone that is currently pending confirmation of Covid19 testing or has been confirmed to have the Covid19 virus?  NO  **If the patient answers NO to ALL questions -  advise the patient to please call the clinic before coming to the office should any symptoms develop.     

## 2020-09-01 ENCOUNTER — Encounter: Payer: Self-pay | Admitting: Infectious Disease

## 2020-09-01 ENCOUNTER — Ambulatory Visit: Payer: Medicare Other | Admitting: Infectious Disease

## 2020-09-01 ENCOUNTER — Other Ambulatory Visit: Payer: Self-pay

## 2020-09-01 VITALS — BP 122/61 | HR 79 | Temp 98.7°F | Wt 168.0 lb

## 2020-09-01 DIAGNOSIS — M4643 Discitis, unspecified, cervicothoracic region: Secondary | ICD-10-CM

## 2020-09-01 DIAGNOSIS — R42 Dizziness and giddiness: Secondary | ICD-10-CM

## 2020-09-01 DIAGNOSIS — G8929 Other chronic pain: Secondary | ICD-10-CM

## 2020-09-01 DIAGNOSIS — R739 Hyperglycemia, unspecified: Secondary | ICD-10-CM

## 2020-09-01 DIAGNOSIS — Z23 Encounter for immunization: Secondary | ICD-10-CM | POA: Diagnosis not present

## 2020-09-01 DIAGNOSIS — M25561 Pain in right knee: Secondary | ICD-10-CM | POA: Diagnosis not present

## 2020-09-01 DIAGNOSIS — M4620 Osteomyelitis of vertebra, site unspecified: Secondary | ICD-10-CM

## 2020-09-01 DIAGNOSIS — I712 Thoracic aortic aneurysm, without rupture: Secondary | ICD-10-CM | POA: Diagnosis not present

## 2020-09-01 DIAGNOSIS — R911 Solitary pulmonary nodule: Secondary | ICD-10-CM

## 2020-09-01 DIAGNOSIS — M1711 Unilateral primary osteoarthritis, right knee: Secondary | ICD-10-CM

## 2020-09-01 DIAGNOSIS — I7121 Aneurysm of the ascending aorta, without rupture: Secondary | ICD-10-CM

## 2020-09-01 DIAGNOSIS — M25562 Pain in left knee: Secondary | ICD-10-CM

## 2020-09-01 HISTORY — DX: Hyperglycemia, unspecified: R73.9
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
## 2020-09-02 ENCOUNTER — Other Ambulatory Visit: Payer: Self-pay | Admitting: Infectious Disease

## 2020-09-02 ENCOUNTER — Telehealth: Payer: Self-pay | Admitting: *Deleted

## 2020-09-02 DIAGNOSIS — G062 Extradural and subdural abscess, unspecified: Secondary | ICD-10-CM

## 2020-09-02 LAB — CBC WITH DIFFERENTIAL/PLATELET
Absolute Monocytes: 854 cells/uL (ref 200–950)
Basophils Absolute: 38 cells/uL (ref 0–200)
Basophils Relative: 0.4 %
Eosinophils Absolute: 230 cells/uL (ref 15–500)
Eosinophils Relative: 2.4 %
HCT: 32.9 % — ABNORMAL LOW (ref 38.5–50.0)
Hemoglobin: 10.9 g/dL — ABNORMAL LOW (ref 13.2–17.1)
Lymphs Abs: 2362 cells/uL (ref 850–3900)
MCH: 29.9 pg (ref 27.0–33.0)
MCHC: 33.1 g/dL (ref 32.0–36.0)
MCV: 90.1 fL (ref 80.0–100.0)
MPV: 9 fL (ref 7.5–12.5)
Monocytes Relative: 8.9 %
Neutro Abs: 6115 cells/uL (ref 1500–7800)
Neutrophils Relative %: 63.7 %
Platelets: 366 10*3/uL (ref 140–400)
RBC: 3.65 10*6/uL — ABNORMAL LOW (ref 4.20–5.80)
RDW: 13.1 % (ref 11.0–15.0)
Total Lymphocyte: 24.6 %
WBC: 9.6 10*3/uL (ref 3.8–10.8)

## 2020-09-02 LAB — BASIC METABOLIC PANEL WITH GFR
BUN/Creatinine Ratio: 15 (calc) (ref 6–22)
BUN: 23 mg/dL (ref 7–25)
CO2: 29 mmol/L (ref 20–32)
Calcium: 9.3 mg/dL (ref 8.6–10.3)
Chloride: 102 mmol/L (ref 98–110)
Creat: 1.49 mg/dL — ABNORMAL HIGH (ref 0.70–1.18)
GFR, Est African American: 54 mL/min/{1.73_m2} — ABNORMAL LOW (ref >=60)
GFR, Est Non African American: 46 mL/min/{1.73_m2} — ABNORMAL LOW (ref >=60)
Glucose, Bld: 100 mg/dL — ABNORMAL HIGH (ref 65–99)
Potassium: 4.7 mmol/L (ref 3.5–5.3)
Sodium: 138 mmol/L (ref 135–146)

## 2020-09-02 LAB — SEDIMENTATION RATE: Sed Rate: 109 mm/h — ABNORMAL HIGH (ref 0–20)

## 2020-09-02 LAB — C-REACTIVE PROTEIN: CRP: 51.7 mg/L — ABNORMAL HIGH (ref <8.0)

## 2020-09-02 NOTE — Telephone Encounter (Signed)
Relayed to patient.  Doxycycline refill was sent to [facility].  Will route note, labs, plan to primary care to keep her in the loop. Landis Gandy, RN

## 2020-09-02 NOTE — Telephone Encounter (Signed)
-----  Message from Truman Hayward, MD sent at 09/02/2020  8:36 AM EDT ----- Patients ESR and CRP are up again. He needs to restart doxycycline and I will order MRI C spine. Kidney fxn a bit worse as well which he should followup with PCP about

## 2020-09-06 DIAGNOSIS — N130 Hydronephrosis with ureteropelvic junction obstruction: Secondary | ICD-10-CM | POA: Diagnosis not present

## 2020-09-06 DIAGNOSIS — N35011 Post-traumatic bulbous urethral stricture: Secondary | ICD-10-CM | POA: Diagnosis not present

## 2020-09-06 DIAGNOSIS — R3914 Feeling of incomplete bladder emptying: Secondary | ICD-10-CM | POA: Diagnosis not present

## 2020-09-07 DIAGNOSIS — M170 Bilateral primary osteoarthritis of knee: Secondary | ICD-10-CM | POA: Diagnosis not present

## 2020-09-07 DIAGNOSIS — M25561 Pain in right knee: Secondary | ICD-10-CM | POA: Diagnosis not present

## 2020-09-07 DIAGNOSIS — M25562 Pain in left knee: Secondary | ICD-10-CM | POA: Diagnosis not present

## 2020-09-09 DIAGNOSIS — G8929 Other chronic pain: Secondary | ICD-10-CM | POA: Diagnosis not present

## 2020-09-09 DIAGNOSIS — Z79899 Other long term (current) drug therapy: Secondary | ICD-10-CM | POA: Diagnosis not present

## 2020-09-09 DIAGNOSIS — M25561 Pain in right knee: Secondary | ICD-10-CM | POA: Diagnosis not present

## 2020-09-09 DIAGNOSIS — M25562 Pain in left knee: Secondary | ICD-10-CM | POA: Diagnosis not present

## 2020-09-16 ENCOUNTER — Ambulatory Visit (HOSPITAL_COMMUNITY)
Admission: RE | Admit: 2020-09-16 | Discharge: 2020-09-16 | Disposition: A | Discharge status: Home / Self Care | Payer: Medicare Other | Source: Ambulatory Visit | Attending: Infectious Disease | Admitting: Infectious Disease

## 2020-09-16 ENCOUNTER — Other Ambulatory Visit: Payer: Self-pay

## 2020-09-16 DIAGNOSIS — M4319 Spondylolisthesis, multiple sites in spine: Secondary | ICD-10-CM | POA: Diagnosis not present

## 2020-09-16 DIAGNOSIS — M4802 Spinal stenosis, cervical region: Secondary | ICD-10-CM | POA: Diagnosis not present

## 2020-09-16 DIAGNOSIS — G062 Extradural and subdural abscess, unspecified: Secondary | ICD-10-CM

## 2020-09-16 DIAGNOSIS — M4642 Discitis, unspecified, cervical region: Secondary | ICD-10-CM | POA: Diagnosis not present

## 2020-09-16 DIAGNOSIS — M4624 Osteomyelitis of vertebra, thoracic region: Secondary | ICD-10-CM | POA: Diagnosis not present

## 2020-09-16 IMAGING — MR MR CERVICAL SPINE WO/W CM
7 of 8 series · 33 of 48 positions shown · IV contrast (gadavist)
Comparison: [DATE]

CLINICAL DATA: Follow-up epidural abscess.

EXAM:
MRI CERVICAL SPINE WITHOUT AND WITH CONTRAST
TECHNIQUE: Multiplanar and multiecho pulse sequences of the cervical spine, to
include the craniocervical junction and cervicothoracic junction,
were obtained without and with intravenous contrast.
CONTRAST:  7.5mL GADAVIST GADOBUTROL 1 MMOL/ML IV SOLN

[Series 9: T2 · sagittal · 3.0mm · 0.62mm/px · 4 of 17 slices shown (1 of 2)]
[im 1/17]
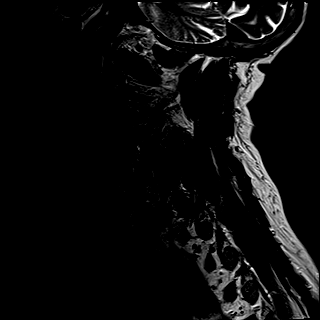
[im 6/17]
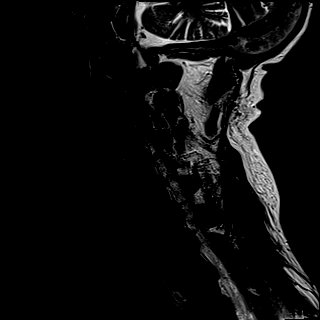
[im 11/17]
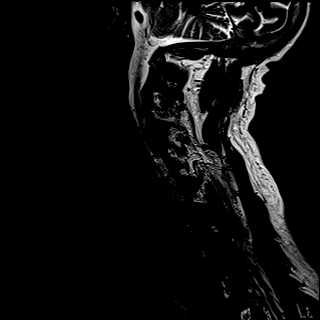
[im 17/17]
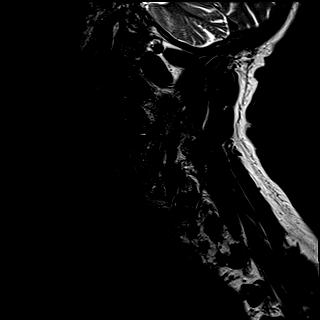

[Series 10: T1 · sagittal · 3.0mm · 0.62mm/px · 4 of 17 slices shown (1 of 2)]
[im 1/17]
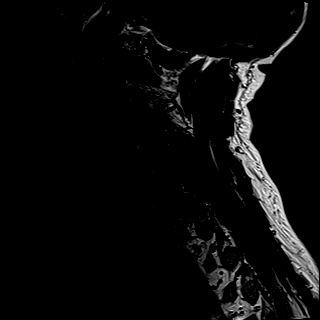
[im 6/17]
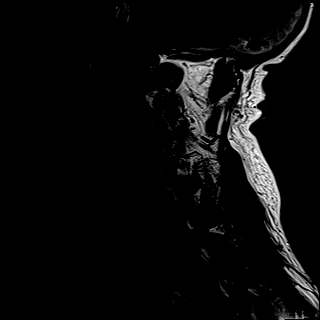
[im 11/17]
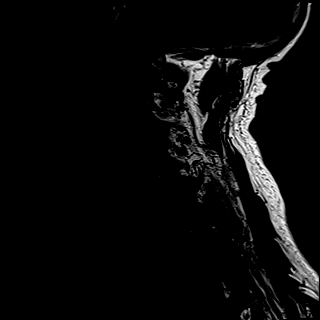
[im 17/17]
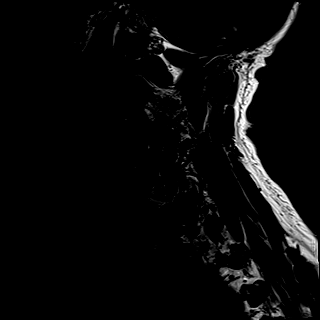

[Series 11: STIR · sagittal · 3.0mm · 0.78mm/px · 4 of 17 slices shown]
[im 1/17]
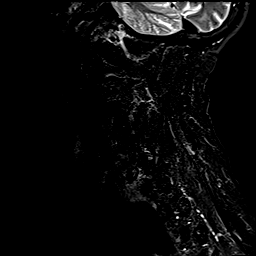
[im 6/17]
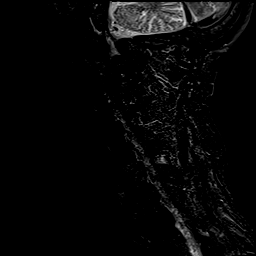
[im 11/17]
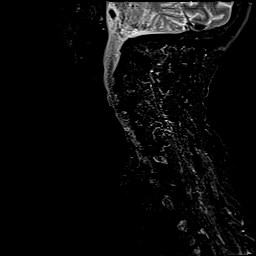
[im 17/17]
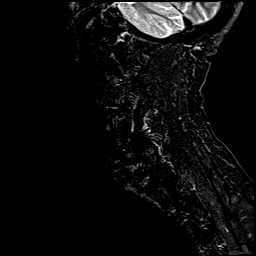

[Series 12: T2 · axial · 3.0mm · 0.70mm/px · z∈[+70,+163]mm · 8 of 31 slices shown (2 of 2)]
[im 1/31]
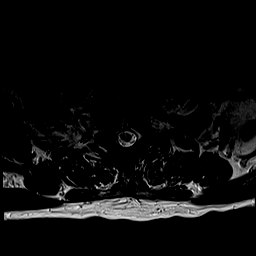
[im 5/31]
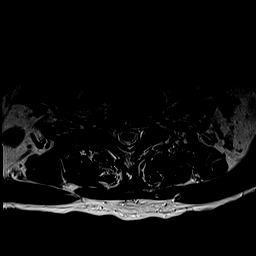
[im 9/31]
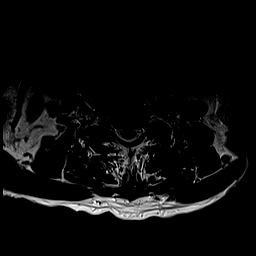
[im 13/31]
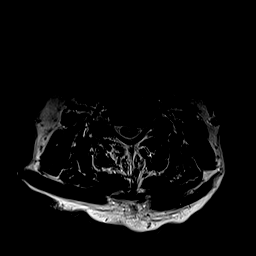
[im 18/31]
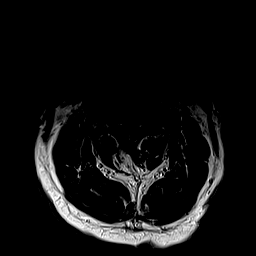
[im 22/31]
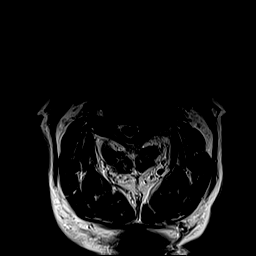
[im 26/31]
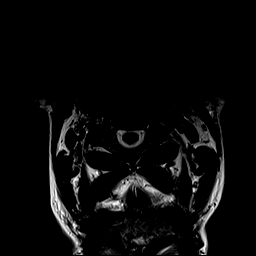
[im 31/31]
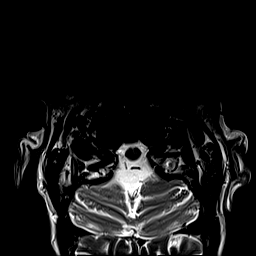

[Series 14: T1 · axial · 3.0mm · 0.35mm/px · z∈[+70,+163]mm · 8 of 31 slices shown (2 of 2)]
[im 1/31]
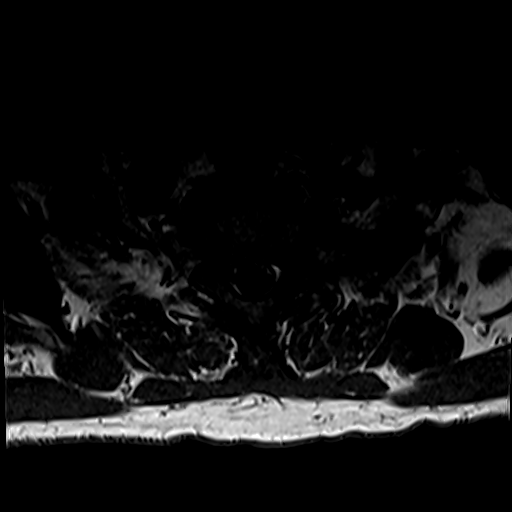
[im 5/31]
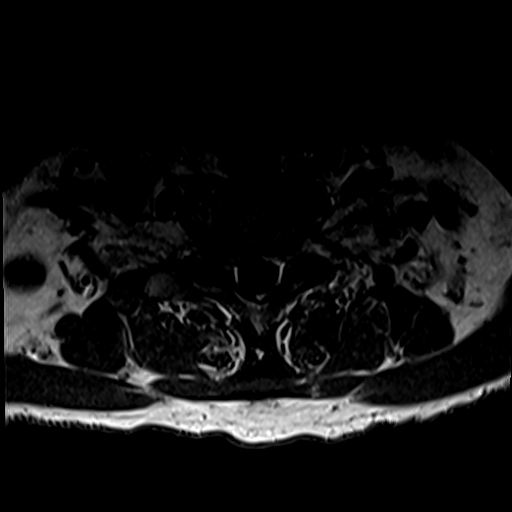
[im 9/31]
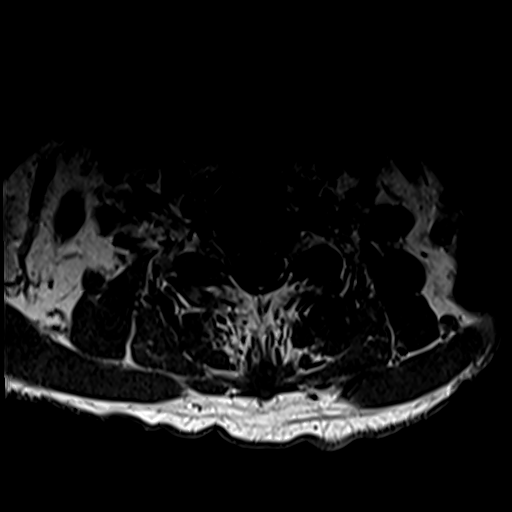
[im 13/31]
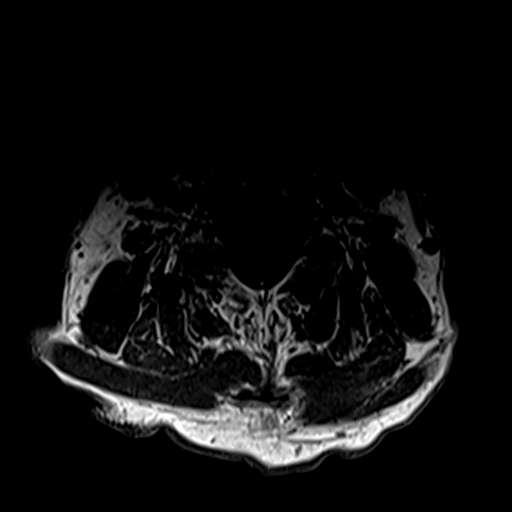
[im 18/31]
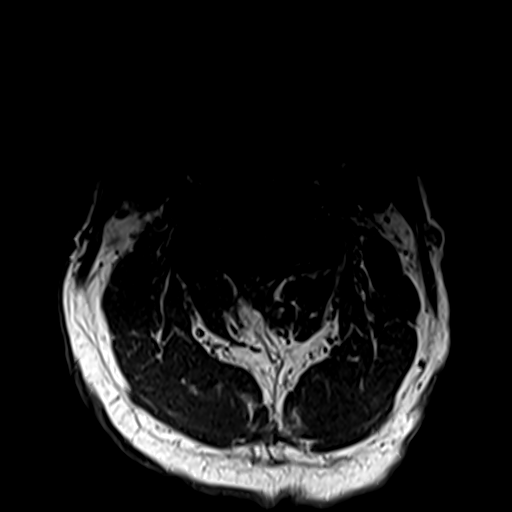
[im 22/31]
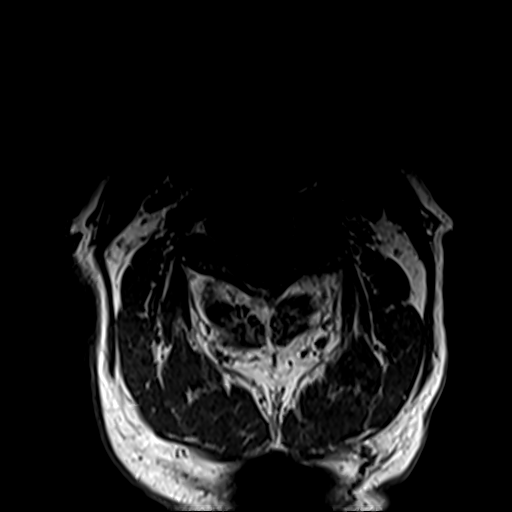
[im 26/31]
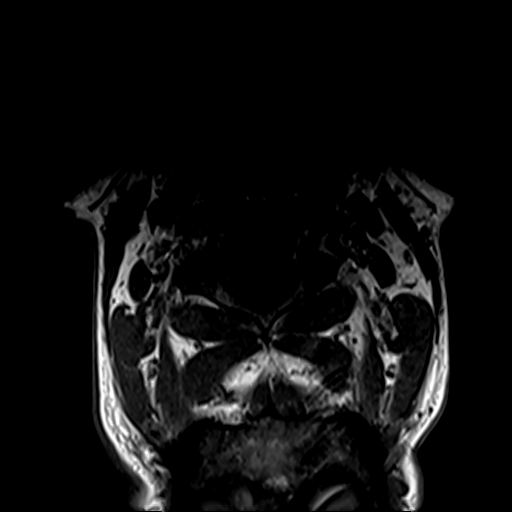
[im 31/31]
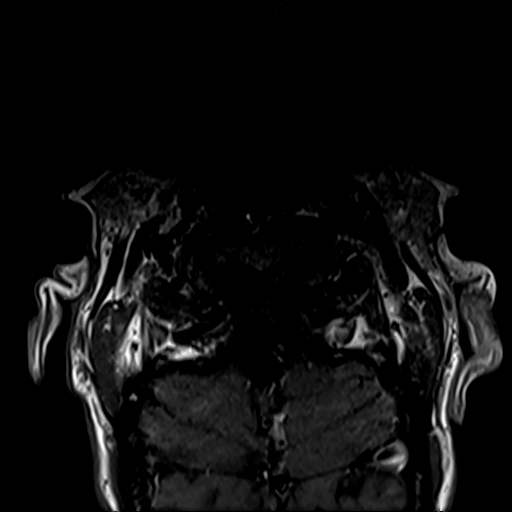

[Series 15: T1 fat-sat post-contrast · sagittal · 3.0mm · 0.62mm/px · 4 of 17 slices shown]
[im 1/17]
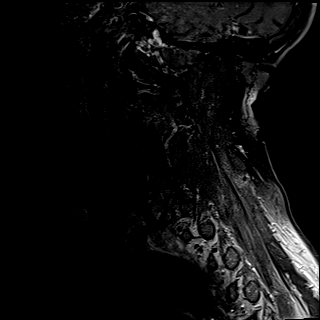
[im 6/17]
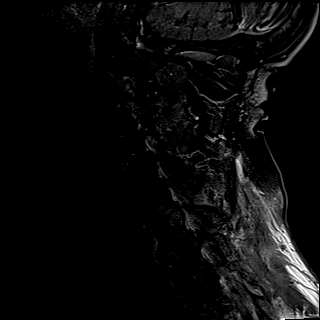
[im 11/17]
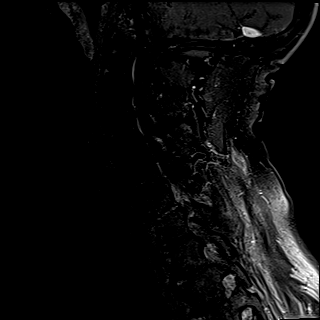
[im 17/17]
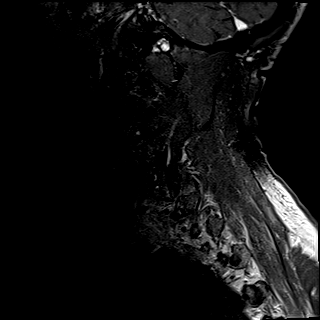

[Series 16: T1 post-contrast · axial · 3.0mm · 0.35mm/px · 1 of 31 slices shown]
[im 1/31]
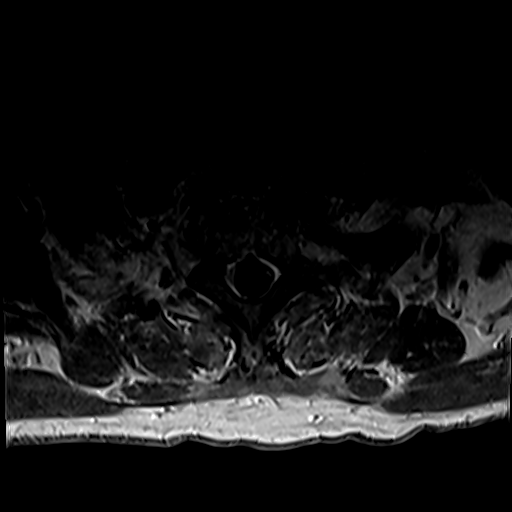

[33 of 48 positions shown; findings below may reference images not displayed]

FINDINGS: Alignment: Cervical spine straightening. Minimal retrolisthesis of
C7 on T1.

Vertebrae: Sequelae of C7-T1 discitis-osteomyelitis are again
identified with complete disc space collapse and erosive endplate
changes. There is mild residual STIR hyperintensity/edema and
enhancement diffusely throughout the C7, T1, and T2 vertebral
bodies, decreased from prior. There is no evidence of new infection
in the cervical spine. Diffusely diminished bone marrow T1 signal
intensity is again noted and is nonspecific but can be seen with
anemia, smoking, and obesity. No acute fracture is identified.
Laminectomies are again noted at T2 and T3.

Cord: Normal cord signal. Epidural enhancement extending from C3-4
into the included upper thoracic spine has decreased from the prior
study. No discrete epidural fluid collection is present.

Posterior Fossa, vertebral arteries, paraspinal tissues: No
paraspinal fluid collection. Preserved vertebral artery flow voids.
Unremarkable included posterior fossa.

Disc levels:

C2-3: Mild disc bulging, asymmetric left uncovertebral spurring, and
moderate right and mild left facet arthrosis result in mild left
greater than right neural foraminal stenosis without spinal
stenosis, unchanged.

C3-4: Disc bulging, uncovertebral spurring, moderate left greater
than right facet arthrosis, and increased infolding of the
ligamentum flavum result in increased, moderate spinal stenosis and
unchanged moderate right and moderate to severe left neural
foraminal stenosis.

C4-5: Disc bulging, uncovertebral spurring, moderate to severe facet
arthrosis, and increased infolding of the ligamentum flavum result
in increased moderate to severe spinal stenosis with mild cord
flattening and moderate bilateral neural foraminal stenosis.

C5-6: A left paracentral disc protrusion and left uncovertebral
spurring result in mild spinal stenosis with slight ventral cord
flattening and moderate to severe left neural foraminal stenosis,
unchanged.

C6-7: A broad-based posterior disc osteophyte complex results in
mild spinal stenosis without significant neural foraminal stenosis,
unchanged.

C7-T1: Uncovertebral spurring results in mild right neural foraminal
stenosis without spinal stenosis.
IMPRESSION: 1. Improving enhancement associated with C7-T1
discitis-osteomyelitis and T2 osteomyelitis. Decreased epidural
enhancement from C3-4 into the included upper thoracic spine. No
discrete epidural abscess.
2. Increased moderate to severe spinal stenosis at C4-5 and moderate
spinal stenosis at C3-4.
3. Moderate to severe multilevel neural foraminal stenosis as above.

## 2020-09-16 MED ORDER — GADOBUTROL 1 MMOL/ML IV SOLN
7.0000 mL | Freq: Once | INTRAVENOUS | Status: AC | PRN
  Administered 2020-09-16: 7.5 mL via INTRAVENOUS

## 2020-09-16 NOTE — Progress Notes (Signed)
PT refused his MRI scan of his Thoracic sp today due to the length of both scans ordered. PT had MRI of Cervical sp done. PT will reschedule his Thoracic sp for a later date.

## 2020-09-17 ENCOUNTER — Telehealth: Payer: Self-pay

## 2020-09-17 ENCOUNTER — Other Ambulatory Visit: Payer: Self-pay | Admitting: *Deleted

## 2020-09-17 NOTE — Patient Outreach (Signed)
[facility]) Care Management  09/17/2020  Kent Flowers [facility] 04-Dec-1946 [id number]   Call placed to member to follow up on management of chronic conditions.  State he had follow up with ID, was told that spinal infection has not completely resolved.  He has been placed back on antibiotics, will have another MRI on 10/5.  Report he fell yesterday and was experiencing a fever, denies a fever today, denies any complications of fall.  He expresses frustration regarding the recurrent infection but remains optimistic about his recovery.  Denies any urgent concerns, encouraged to contact this care manager with questions.  Will follow up with member within the next month.  Goals Addressed            This Visit's Progress   . [facility] - Cope with Pain       Follow Up Date 10/18/20   - learn relaxation techniques - use relaxation during pain    Why is this important?   Living with back pain and enjoying your life may be hard.  Feelings, such as depression or anger, can make your pain worse.  Learning ways to cope may help you find some relief from the pain.    Notes: Recurrent spinal infection     . [facility]N - Keep Pain Under Control       Follow Up Date *10/18/20   - develop a personal pain management plan - stay active    Why is this important?   Day-to-day life can be hard when you have back pain.  Pain medicine is just one piece of the treatment puzzle. There are many things you can do to manage pain and keep your back strong.   Lifestyle changes, like stopping smoking and eating foods with Vitamin D and calcium, keep your bones and muscles healthy. Your back is better when it is supported by strong muscles.  You can try these action steps to help you manage your pain.     Notes:     . [facility] - Make and Keep All Appointments       Follow Up Date 10/18/20   - call to cancel if needed - keep a calendar with appointment dates    Why is this important?   Part of staying healthy  is seeing the doctor for follow-up care.  If you forget your appointments, there are some things you can do to stay on track.    Notes: MRI 10/5, follow up with ID after MRI Pulmonology [place], RN, MSN [place] Manager [phone]

## 2020-09-17 NOTE — Telephone Encounter (Signed)
Ok, thank you

## 2020-09-17 NOTE — Telephone Encounter (Signed)
Would recommend the patient to go to ED.

## 2020-09-17 NOTE — Telephone Encounter (Signed)
Spoke with patient and patient disagrees with going to the ED at this time. States he tripped over something in the kitchen. Patient states he feels ill for any reason he will certainly go. Denies any fevers this morning and states his ankle feeling fine. Placed call to wife and also made her aware of patient's decision.  Eugenia Mcalpine

## 2020-09-17 NOTE — Telephone Encounter (Signed)
Received call from wife with patient having fever last night after a fall. Temp at 9pm was 101.5. Has not checked temp this morning as patient is still sleeping.  Patient took 2 tylenol. Patient fell at 8pm. Wife states right ankle was swollen after the fall and applied ice.  Currently taking doxycycline now for 2 weeks now for cervical and thoracic MSSA infection. Wife states patient is not fully orientated to state if he is in pain or not. Wife does not believe patient is in any pain due to fall. Wife will call office back this morning once patient awakens to obtain temp. Patient advised that Dr. Tommy Medal is out of the office this week, but will make another partner aware. Routing message to Dr. [place]. Eugenia Mcalpine

## 2020-09-21 ENCOUNTER — Encounter: Payer: Self-pay | Admitting: Internal Medicine

## 2020-09-21 ENCOUNTER — Other Ambulatory Visit: Payer: Self-pay

## 2020-09-21 ENCOUNTER — Ambulatory Visit (HOSPITAL_COMMUNITY)
Admission: RE | Admit: 2020-09-21 | Discharge: 2020-09-21 | Disposition: A | Discharge status: Home / Self Care | Payer: Medicare Other | Source: Ambulatory Visit | Attending: Infectious Disease | Admitting: Infectious Disease

## 2020-09-21 ENCOUNTER — Ambulatory Visit: Payer: Medicare Other | Admitting: Internal Medicine

## 2020-09-21 DIAGNOSIS — M47814 Spondylosis without myelopathy or radiculopathy, thoracic region: Secondary | ICD-10-CM | POA: Diagnosis not present

## 2020-09-21 DIAGNOSIS — G061 Intraspinal abscess and granuloma: Secondary | ICD-10-CM | POA: Diagnosis not present

## 2020-09-21 DIAGNOSIS — J449 Chronic obstructive pulmonary disease, unspecified: Secondary | ICD-10-CM | POA: Diagnosis not present

## 2020-09-21 DIAGNOSIS — G062 Extradural and subdural abscess, unspecified: Secondary | ICD-10-CM | POA: Diagnosis not present

## 2020-09-21 DIAGNOSIS — R32 Unspecified urinary incontinence: Secondary | ICD-10-CM | POA: Diagnosis not present

## 2020-09-21 DIAGNOSIS — M40204 Unspecified kyphosis, thoracic region: Secondary | ICD-10-CM | POA: Diagnosis not present

## 2020-09-21 DIAGNOSIS — M4319 Spondylolisthesis, multiple sites in spine: Secondary | ICD-10-CM | POA: Diagnosis not present

## 2020-09-21 IMAGING — MR MR THORACIC SPINE WO/W CM
8 of 9 series · 33 of 48 positions shown · IV contrast (gadavist)
Comparison: [DATE]

CLINICAL DATA: Epidural abscess status post laminectomy

EXAM:
MRI THORACIC WITHOUT AND WITH CONTRAST
TECHNIQUE: Multiplanar and multiecho pulse sequences of the thoracic spine were
obtained without and with intravenous contrast.
CONTRAST:  8mL GADAVIST GADOBUTROL 1 MMOL/ML IV SOLN

[Series 16: T1 · sagittal · 4.0mm · 1.72mm/px · 1 of 5 slices shown (1 of 3)]
[im 1/5]
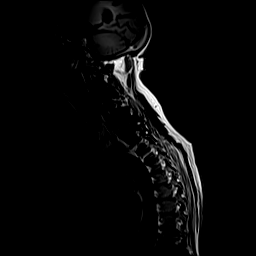

[Series 17: STIR · sagittal · 3.0mm · 1.00mm/px · 2 of 15 slices shown]
[im 1/15]
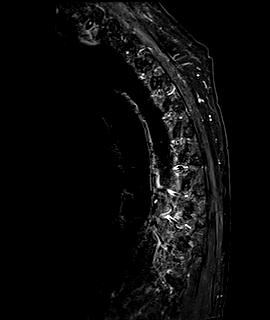
[im 15/15]
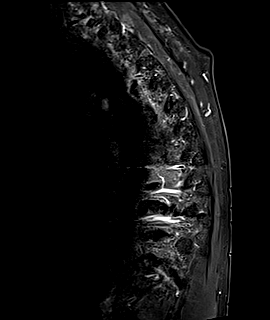

[Series 18: T1 · sagittal · 3.0mm · 1.00mm/px · 3 of 15 slices shown (2 of 3)]
[im 1/15]
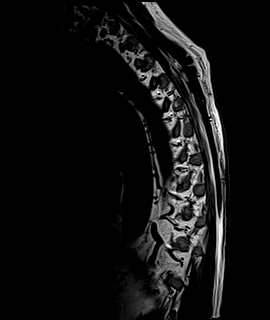
[im 8/15]
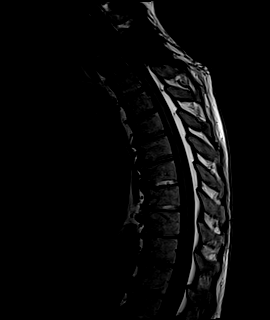
[im 15/15]
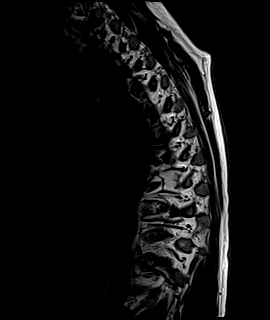

[Series 19: T2 · axial · 4.0mm · 0.78mm/px · z∈[-257,-36]mm · 9 of 47 slices shown]
[im 1/47]
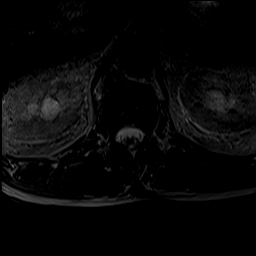
[im 6/47]
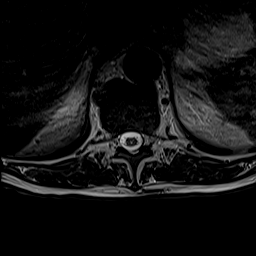
[im 12/47]
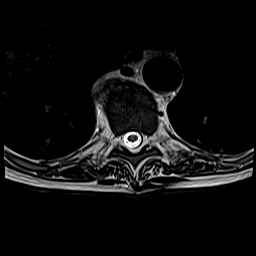
[im 18/47]
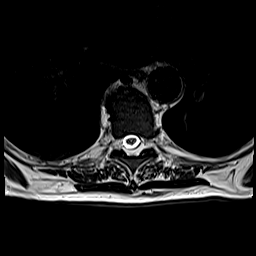
[im 24/47]
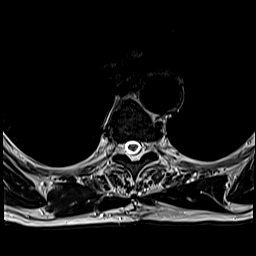
[im 29/47]
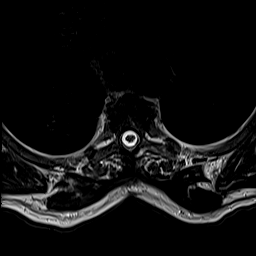
[im 35/47]
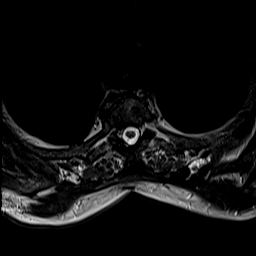
[im 41/47]
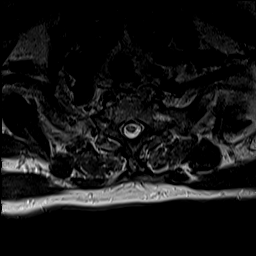
[im 47/47]
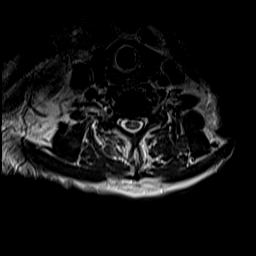

[Series 20: T1 · axial · 4.0mm · 0.39mm/px · z∈[-257,-36]mm · 8 of 47 slices shown (3 of 3)]
[im 1/47]
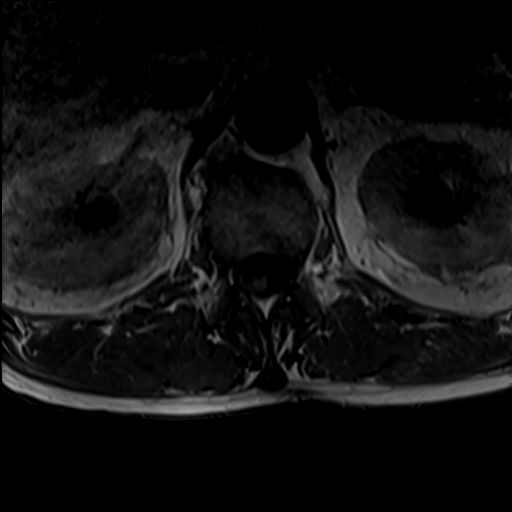
[im 6/47]
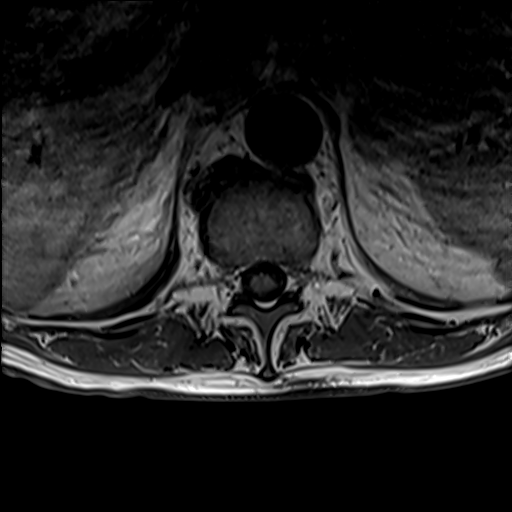
[im 12/47]
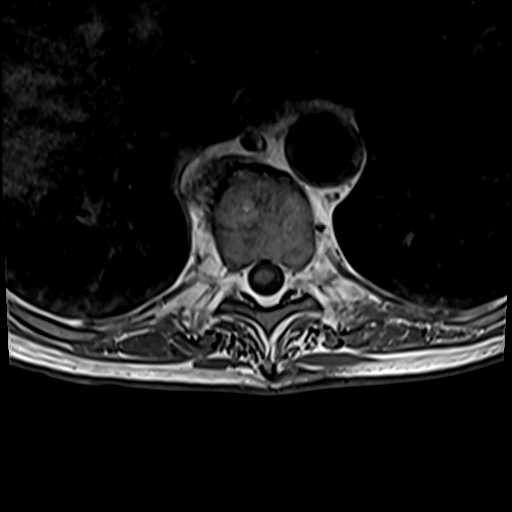
[im 18/47]
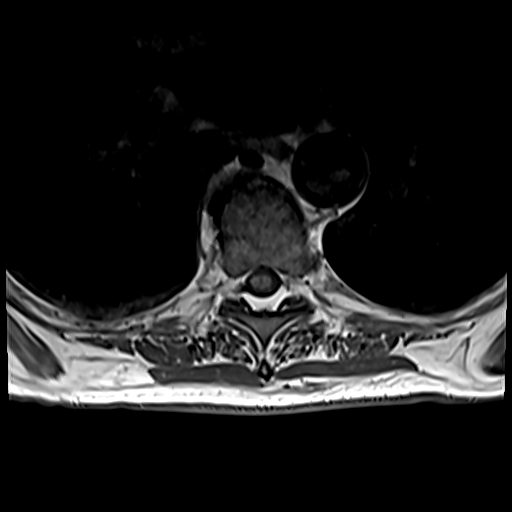
[im 29/47]
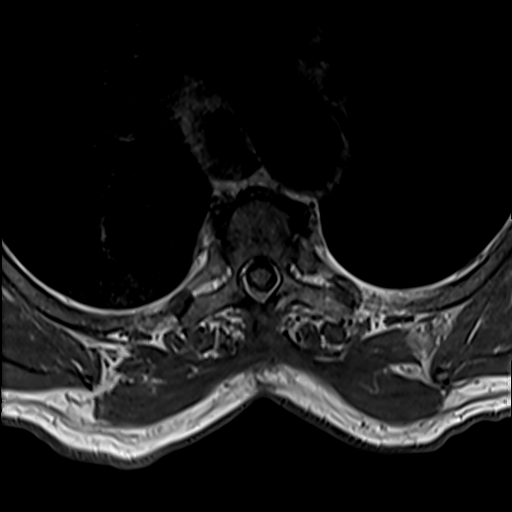
[im 35/47]
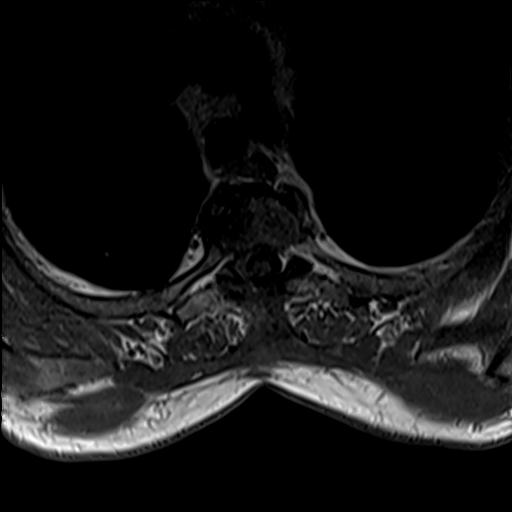
[im 41/47]
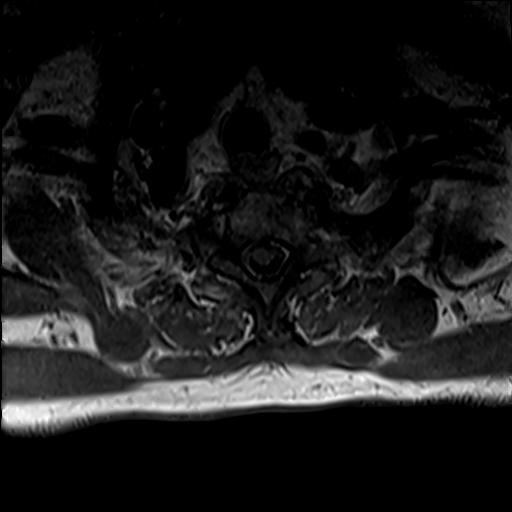
[im 47/47]
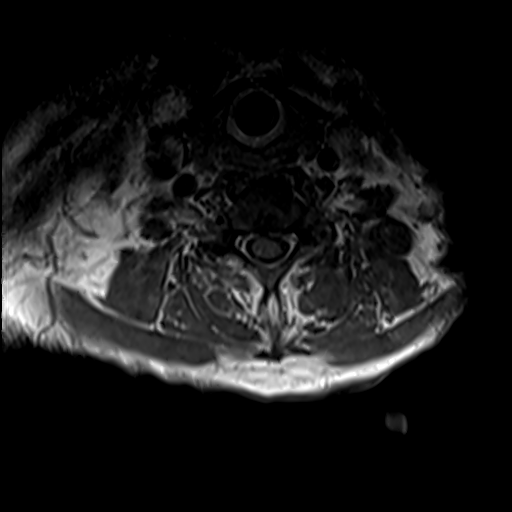

[Series 22: T2 post-contrast · sagittal · 3.0mm · 1.00mm/px · 3 of 15 slices shown]
[im 1/15]
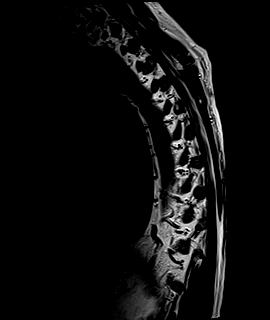
[im 8/15]
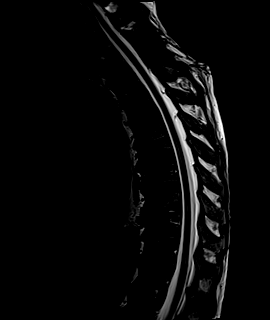
[im 15/15]
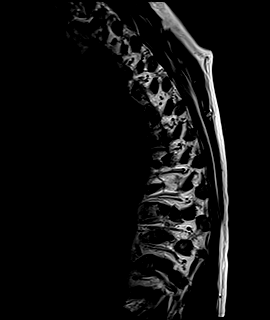

[Series 23: T1 fat-sat post-contrast · sagittal · 3.0mm · 1.00mm/px · 3 of 15 slices shown]
[im 1/15]
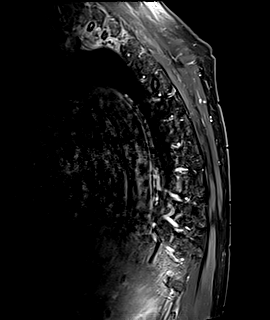
[im 8/15]
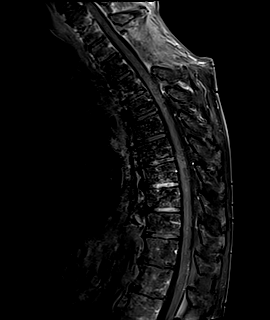
[im 15/15]
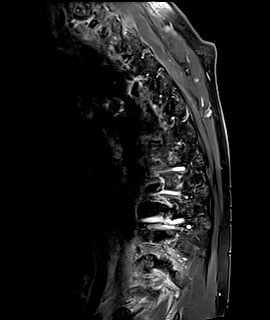

[Series 24: T1 post-contrast · axial · 4.0mm · 0.39mm/px · z∈[-257,-159]mm · 4 of 47 slices shown]
[im 1/47]
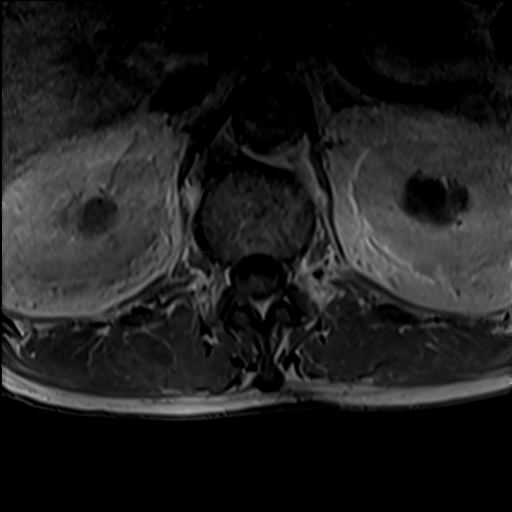
[im 6/47]
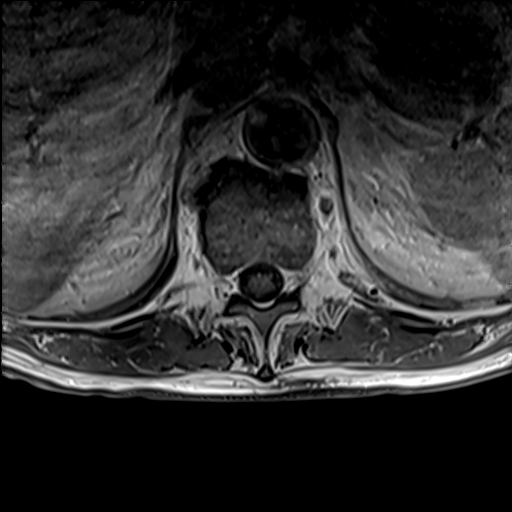
[im 12/47]
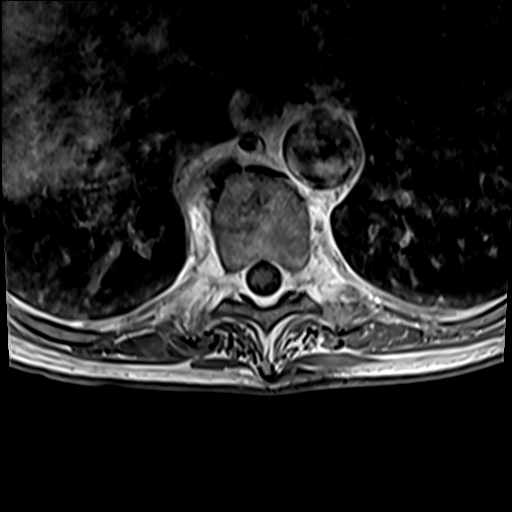
[im 18/47]
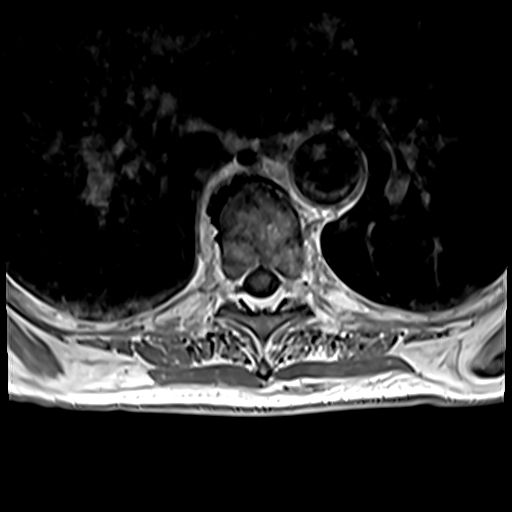

[33 of 48 positions shown; findings below may reference images not displayed]

FINDINGS: MRI THORACIC SPINE FINDINGS

Alignment: Exaggerated thoracic kyphosis. 3 mm of C7-T1
retrolisthesis, chronic.

Vertebrae: T2 and T3 laminectomy with scarring. No inflammation or
abscess seen at this level.

C7, T1, and T2 mild STIR and T2 hyperintensity within the vertebral
bodies. No T1 hypointensity or progression. Rather, STIR signal at
these levels has subsided, as has enhancement. The current
appearance is likely the new baseline with residual signal
abnormality from scarring.

Cord: Minimal inferior hydromyelia. No cord edema or compression. No
spinal canal collection.

Paraspinal and other soft tissues: Postoperative scarring as
described above. No paravertebral abscess. There is paraspinal
hyperintensity on sagittal postcontrast imaging, but this could be
related to fat saturation artifact at the lung apices. Cystic hilar
appearance at the upper poles of the kidneys. On [DATE]
PET-CT this was hydronephrosis from urinary retention.

Disc levels:

C7-T1 disc collapse and retrolisthesis, stable. Ordinary generalized
spondylosis with probable multilevel bridging.
IMPRESSION: 1. Regression of inflammation at the cervicothoracic junction when
compared with [DATE]. The current appearance is likely the
patient's new baseline.
2. T2 and T3 laminectomy without recurrent collection.
3. Minimal coverage of hydronephrosis on both sides; this was also
seen on a PET CT from [DATE] when there was urinary
retention.

## 2020-09-21 MED ORDER — ALBUTEROL SULFATE HFA 108 (90 BASE) MCG/ACT IN AERS
INHALATION_SPRAY | RESPIRATORY_TRACT | 12 refills | Status: DC
Start: 2020-09-21 — End: 2021-09-27

## 2020-09-21 MED ORDER — GADOBUTROL 1 MMOL/ML IV SOLN
10.0000 mL | Freq: Once | INTRAVENOUS | Status: AC | PRN
  Administered 2020-09-21: 8 mL via INTRAVENOUS

## 2020-09-21 MED ORDER — ALBUTEROL SULFATE HFA 108 (90 BASE) MCG/ACT IN AERS: INHALATION_SPRAY | RESPIRATORY_TRACT | 12 refills | Status: DC

## 2020-09-21 NOTE — Assessment & Plan Note (Signed)
Clinically stable despite an otherwise rough year. We are avoiding LAMA class meds due to his BPH problems. Asked him to try to get [place] documentation from the ?SNF where he got vaccinations, for our record.

## 2020-09-21 NOTE — Progress Notes (Signed)
HPI male former smoker followed for COPD, history lung nodules, complicated by osteoarthritis, ascending aortic aneurysm Spirometry 01/24/12- FVC 2.24/ 64%, FEV1 1.20/ 42%, FEV1/FVC 0.53 PFT: 03/19/2012-moderate obstructive airways disease, insignificant response to bronchodilator , air trapping,normal diffusion capacity. Loop contour suggests emphysema. FVC 3.73/91%, FEV1 2.07/73%, FEV1/FVC 0.56. TLC 114%, RV 138% , DLCO 90% CT chest 10/01/13- Visualized small lung nodules are benign based on current consensus criteria  Office Spirometry 05/03/17-moderately severe obstruction. FVC 3.25/79%, FEV1 1.77/58%, ratio 0.54, FEF 25-75% 0.88/38%  -------------------------------------------------------------------------------------------------------------   09/22/2019- 74 year old male former smoker followed for COPD, history lung nodules  -----pt states breathing is at baseline; pt states he intends to stop Anoro because he does not see benefit from it Anoro, albuterol hfa Uses rescue inhaler at bedtime, then on first waking to clear morning phlegm. Otherwise feels comfortable. Stable DOE with significant physical effort only. Still working some doing Architect projects part time.  Pending TKR. New problem- Insomnia. Stays up till 1-2AM. Takes gabapentin, unisom. Watches TV. Discussed melatonin to reinforce regular bedtime schedule.  Declines flu vax.  09/21/20-  74 year old male former smoker followed for COPD, history lung nodules , R apical Lung Mass (resolved), T-Spine Epidural Abscess MSSA, BPH, Aortic Aneurysm, CAD, Hepatitis,  -----Patient feels like his breathing is the same as last visit, was in the hospital beginning of year with MSSA Proair HFA,       Quit Anoro- not helpful Covid vax- he reports 2 Moderna, no card Flu vax- He thinks he got flu and pneumonia vax from one of his doctor visits last week, but can't remember, outside our system, and he comments on how very many doctors he sees. No  longer drives. 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   Breathing is stable with light cough, clear mucus. Uses only albuterol hfa 2 puffs bid and doesn't fel need for anything else.  PET scan 02/11/20-  IMPRESSION: 1. Previously seen abnormal soft tissue at the apex of the right hemithorax has resolved in the interval. There is a minimally hypermetabolic residual low right paratracheal lymph node, which may be reactive. 2. Mild hypermetabolism within the head of the pancreas, without a definite CT correlate. Finding is nonspecific. If further evaluation is desired, MR abdomen without and with contrast is suggested. 3. Bilateral hydronephrosis with marked bladder distension. 4. Borderline enlarged inguinal lymph nodes without abnormal hypermetabolism, likely reactive. 5. Postoperative changes in the lower cervical and upper thoracic spine, with expected mild hypermetabolism. Associated focal fluid collection posterior to the upper thoracic spine may represent a seroma. Difficult to exclude abscess. Please refer to thoracic spine MR 02/04/2020. 6. Aortic atherosclerosis (ICD10-I70.0). Coronary artery calcification. CT 12/21/19-    ROS-see HPI      + = positive Constitutional:   No-   weight loss, night sweats,  chills, fatigue, lassitude. HEENT:   No-  headaches, difficulty swallowing, tooth/dental problems,        No-  sneezing, itching, ear ache,  +nasal congestion, post nasal drip,  CV:  No- chest pain, orthopnea, PND, swelling in lower extremities, anasarca,  dizziness, palpitations Resp: +   shortness of breath with exertion or at rest.              +productive cough, no- non-productive cough,  No- coughing up of blood.  No change in color of mucus.  + wheezing.   Skin: No-    rash or lesions. GI:  No-   heartburn, indigestion, abdominal pain, nausea, vomiting GU: No-   dysuria,. MS:  +  joint pain or swelling.     Neuro-     nothing unusual Psych:  No- change in mood or affect. No depression or anxiety.  No memory loss.  OBJ- Physical Exam General- Alert, Oriented, Affect-appropriate, Distress- none acute, + slender Skin- rash-none, lesions- none, excoriation- none Lymphadenopathy- none Head- atraumatic            Eyes- Gross vision intact, PERRLA, conjunctivae and secretions clear            Ears- Hearing,             Nose- Clear, no-Septal dev, mucus, polyps, erosion, perforation             Throat- Mallampati II , mucosa clear , drainage- none, tonsils- atrophic Neck- flexible , trachea midline, no stridor , thyroid nl, carotid no bruit Chest - symmetrical excursion , unlabored           Heart/CV- RRR , no murmur , no gallop  , no rub, nl s1 s2                           - JVD- none , edema- none, stasis changes- none, varices- none           Lung- + diminished/ clear, wheeze-none, cough- none , dullness-none, rub- none           Chest wall-  Abd-  Br/ Gen/ Rectal- Not done, not indicated Extrem- cyanosis- none, clubbing, none, atrophy- none, strength- nl Neuro- + speech is slow, +thenar muscle trophy R thumb

## 2020-09-21 NOTE — Assessment & Plan Note (Signed)
Wears incontinence briefs Followed by Urology

## 2020-09-21 NOTE — Patient Instructions (Signed)
Please let us know where and when you got your flu and covid vaccinations if you can.  Refill prescription sent for Proair albuterol inhaler  Please call if we can help

## 2020-09-29 DIAGNOSIS — R1084 Generalized abdominal pain: Secondary | ICD-10-CM | POA: Diagnosis not present

## 2020-09-29 DIAGNOSIS — Z1211 Encounter for screening for malignant neoplasm of colon: Secondary | ICD-10-CM | POA: Diagnosis not present

## 2020-09-29 DIAGNOSIS — A048 Other specified bacterial intestinal infections: Secondary | ICD-10-CM | POA: Diagnosis not present

## 2020-09-29 DIAGNOSIS — K219 Gastro-esophageal reflux disease without esophagitis: Secondary | ICD-10-CM | POA: Diagnosis not present

## 2020-09-29 DIAGNOSIS — R8290 Unspecified abnormal findings in urine: Secondary | ICD-10-CM | POA: Diagnosis not present

## 2020-10-01 DIAGNOSIS — R1084 Generalized abdominal pain: Secondary | ICD-10-CM | POA: Diagnosis not present

## 2020-10-02 DIAGNOSIS — Z1211 Encounter for screening for malignant neoplasm of colon: Secondary | ICD-10-CM | POA: Diagnosis not present

## 2020-10-02 DIAGNOSIS — R1013 Epigastric pain: Secondary | ICD-10-CM | POA: Diagnosis not present

## 2020-10-02 DIAGNOSIS — Z7901 Long term (current) use of anticoagulants: Secondary | ICD-10-CM | POA: Diagnosis not present

## 2020-10-02 DIAGNOSIS — R748 Abnormal levels of other serum enzymes: Secondary | ICD-10-CM | POA: Diagnosis not present

## 2020-10-07 DIAGNOSIS — Z79899 Other long term (current) drug therapy: Secondary | ICD-10-CM | POA: Diagnosis not present

## 2020-10-07 DIAGNOSIS — M25561 Pain in right knee: Secondary | ICD-10-CM | POA: Diagnosis not present

## 2020-10-07 DIAGNOSIS — G8929 Other chronic pain: Secondary | ICD-10-CM | POA: Diagnosis not present

## 2020-10-07 DIAGNOSIS — M25562 Pain in left knee: Secondary | ICD-10-CM | POA: Diagnosis not present

## 2020-10-09 DIAGNOSIS — K8050 Calculus of bile duct without cholangitis or cholecystitis without obstruction: Secondary | ICD-10-CM | POA: Diagnosis not present

## 2020-10-09 DIAGNOSIS — K838 Other specified diseases of biliary tract: Secondary | ICD-10-CM | POA: Diagnosis not present

## 2020-10-13 DIAGNOSIS — R3914 Feeling of incomplete bladder emptying: Secondary | ICD-10-CM | POA: Diagnosis not present

## 2020-10-18 ENCOUNTER — Other Ambulatory Visit: Payer: Self-pay | Admitting: *Deleted

## 2020-10-18 NOTE — Patient Outreach (Addendum)
[facility]) Care Management  10/18/2020  Kent Flowers [facility] Nov 18, 1946 [id number]   Monthly call placed to member, following up on recurrent spinal infection, no answer.  HIPAA compliant voice message left, will follow up within the next 3-4 business days.    Update:  Incoming call received back from member.  State he remains on antibiotics for recurrent spine infection, will have follow up with ID on 11/17.  Report he is not having GI issues, was told that he has a bile duct stone that need to be removed.  He is unsure of the approach for removal but does have appointment with GI (Dr. Noralee Stain) on 11/3 to develop action plan.  Continue to work through the pain of his back and GI issues but denies any urgent concerns.  Encouraged to contact this care manager with questions, agrees to follow up within the next month.  Goals Addressed            This Visit's Progress   . [facility]N - Cope with Pain   On track    Follow Up Date 10/18/20   - learn relaxation techniques - use relaxation during pain    Why is this important?   Living with back pain and enjoying your life may be hard.  Feelings, such as depression or anger, can make your pain worse.  Learning ways to cope may help you find some relief from the pain.    Notes: Recurrent spinal infection     . [facility] - Keep Pain Under Control   On track    Follow Up Date *10/18/20   - develop a personal pain management plan - stay active    Why is this important?   Day-to-day life can be hard when you have back pain.  Pain medicine is just one piece of the treatment puzzle. There are many things you can do to manage pain and keep your back strong.   Lifestyle changes, like stopping smoking and eating foods with Vitamin D and calcium, keep your bones and muscles healthy. Your back is better when it is supported by strong muscles.  You can try these action steps to help you manage your pain.     Notes:     . [facility] - Make and Keep  All Appointments   On track    Follow Up Date 10/18/20   - call to cancel if needed - keep a calendar with appointment dates    Why is this important?   Part of staying healthy is seeing the doctor for follow-up care.  If you forget your appointments, there are some things you can do to stay on track.    Notes: MRI 10/5, follow up with ID after MRI Pulmonology [place], RN, MSN [place] Manager [phone]

## 2020-10-22 ENCOUNTER — Ambulatory Visit: Payer: Self-pay | Admitting: *Deleted

## 2020-10-25 ENCOUNTER — Telehealth: Payer: Self-pay

## 2020-10-25 NOTE — Telephone Encounter (Signed)
Patient called office today stating [facility] found a large stone in his Bile duct. Is waiting on next steps from that office. Would like to update MD regarding this.  Is not sure if anything is needed by Infectious Disease for this. Force

## 2020-10-25 NOTE — Telephone Encounter (Signed)
Thanks so much. I dont think there is much we can do

## 2020-11-03 ENCOUNTER — Encounter: Payer: Self-pay | Admitting: Infectious Disease

## 2020-11-03 ENCOUNTER — Ambulatory Visit: Payer: Medicare Other | Admitting: Infectious Disease

## 2020-11-03 ENCOUNTER — Other Ambulatory Visit: Payer: Self-pay

## 2020-11-03 VITALS — BP 133/72 | HR 77 | Temp 98.4°F | Wt 161.0 lb

## 2020-11-03 DIAGNOSIS — M4620 Osteomyelitis of vertebra, site unspecified: Secondary | ICD-10-CM

## 2020-11-03 DIAGNOSIS — R918 Other nonspecific abnormal finding of lung field: Secondary | ICD-10-CM | POA: Diagnosis not present

## 2020-11-03 DIAGNOSIS — M4643 Discitis, unspecified, cervicothoracic region: Secondary | ICD-10-CM

## 2020-11-03 DIAGNOSIS — G8929 Other chronic pain: Secondary | ICD-10-CM

## 2020-11-03 DIAGNOSIS — K8050 Calculus of bile duct without cholangitis or cholecystitis without obstruction: Secondary | ICD-10-CM

## 2020-11-03 DIAGNOSIS — G062 Extradural and subdural abscess, unspecified: Secondary | ICD-10-CM

## 2020-11-03 DIAGNOSIS — Z9889 Other specified postprocedural states: Secondary | ICD-10-CM

## 2020-11-03 DIAGNOSIS — G061 Intraspinal abscess and granuloma: Secondary | ICD-10-CM

## 2020-11-03 DIAGNOSIS — M25561 Pain in right knee: Secondary | ICD-10-CM

## 2020-11-03 DIAGNOSIS — M25562 Pain in left knee: Secondary | ICD-10-CM

## 2020-11-03 HISTORY — DX: Calculus of bile duct without cholangitis or cholecystitis without obstruction: K80.50

## 2020-11-03 MED ORDER — DOXYCYCLINE HYCLATE 100 MG PO TABS
100.0000 mg | ORAL_TABLET | Freq: Two times a day (BID) | ORAL | 7 refills | Status: DC
Start: 2020-11-03 — End: 2022-01-18
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
## 2020-11-04 DIAGNOSIS — M4624 Osteomyelitis of vertebra, thoracic region: Secondary | ICD-10-CM | POA: Diagnosis not present

## 2020-11-04 DIAGNOSIS — N319 Neuromuscular dysfunction of bladder, unspecified: Secondary | ICD-10-CM | POA: Diagnosis not present

## 2020-11-04 DIAGNOSIS — G8929 Other chronic pain: Secondary | ICD-10-CM | POA: Diagnosis not present

## 2020-11-04 DIAGNOSIS — N130 Hydronephrosis with ureteropelvic junction obstruction: Secondary | ICD-10-CM | POA: Diagnosis not present

## 2020-11-04 DIAGNOSIS — M25562 Pain in left knee: Secondary | ICD-10-CM | POA: Diagnosis not present

## 2020-11-04 DIAGNOSIS — M25561 Pain in right knee: Secondary | ICD-10-CM | POA: Diagnosis not present

## 2020-11-04 DIAGNOSIS — Z79899 Other long term (current) drug therapy: Secondary | ICD-10-CM | POA: Diagnosis not present

## 2020-11-04 LAB — COMPLETE METABOLIC PANEL WITH GFR
AG Ratio: 1.2 (calc) (ref 1.0–2.5)
ALT: 9 U/L (ref 9–46)
AST: 12 U/L (ref 10–35)
Albumin: 3.7 g/dL (ref 3.6–5.1)
Alkaline phosphatase (APISO): 140 U/L (ref 35–144)
BUN/Creatinine Ratio: 13 (calc) (ref 6–22)
BUN: 18 mg/dL (ref 7–25)
CO2: 31 mmol/L (ref 20–32)
Calcium: 9.2 mg/dL (ref 8.6–10.3)
Chloride: 102 mmol/L (ref 98–110)
Creat: 1.44 mg/dL — ABNORMAL HIGH (ref 0.70–1.18)
GFR, Est African American: 55 mL/min/{1.73_m2} — ABNORMAL LOW (ref >=60)
GFR, Est Non African American: 48 mL/min/{1.73_m2} — ABNORMAL LOW (ref >=60)
Globulin: 3.2 g/dL (calc) (ref 1.9–3.7)
Glucose, Bld: 126 mg/dL — ABNORMAL HIGH (ref 65–99)
Potassium: 4.6 mmol/L (ref 3.5–5.3)
Sodium: 141 mmol/L (ref 135–146)
Total Bilirubin: 0.4 mg/dL (ref 0.2–1.2)
Total Protein: 6.9 g/dL (ref 6.1–8.1)

## 2020-11-04 LAB — CBC WITH DIFFERENTIAL/PLATELET
Absolute Monocytes: 757 cells/uL (ref 200–950)
Basophils Absolute: 39 cells/uL (ref 0–200)
Basophils Relative: 0.4 %
Eosinophils Absolute: 301 cells/uL (ref 15–500)
Eosinophils Relative: 3.1 %
HCT: 35.1 % — ABNORMAL LOW (ref 38.5–50.0)
Hemoglobin: 11.5 g/dL — ABNORMAL LOW (ref 13.2–17.1)
Lymphs Abs: 2153 cells/uL (ref 850–3900)
MCH: 30.1 pg (ref 27.0–33.0)
MCHC: 32.8 g/dL (ref 32.0–36.0)
MCV: 91.9 fL (ref 80.0–100.0)
MPV: 9.2 fL (ref 7.5–12.5)
Monocytes Relative: 7.8 %
Neutro Abs: 6451 cells/uL (ref 1500–7800)
Neutrophils Relative %: 66.5 %
Platelets: 371 10*3/uL (ref 140–400)
RBC: 3.82 10*6/uL — ABNORMAL LOW (ref 4.20–5.80)
RDW: 12.5 % (ref 11.0–15.0)
Total Lymphocyte: 22.2 %
WBC: 9.7 10*3/uL (ref 3.8–10.8)

## 2020-11-04 LAB — C-REACTIVE PROTEIN: CRP: 49.6 mg/L — ABNORMAL HIGH (ref <8.0)

## 2020-11-04 LAB — SEDIMENTATION RATE: Sed Rate: 72 mm/h — ABNORMAL HIGH (ref 0–20)

## 2020-11-08 DIAGNOSIS — K8050 Calculus of bile duct without cholangitis or cholecystitis without obstruction: Secondary | ICD-10-CM | POA: Diagnosis not present

## 2020-11-08 DIAGNOSIS — Z7901 Long term (current) use of anticoagulants: Secondary | ICD-10-CM | POA: Diagnosis not present

## 2020-11-16 ENCOUNTER — Ambulatory Visit: Payer: Medicare Other | Attending: Internal Medicine

## 2020-11-16 DIAGNOSIS — S5001XA Contusion of right elbow, initial encounter: Secondary | ICD-10-CM | POA: Diagnosis not present

## 2020-11-16 DIAGNOSIS — S0081XA Abrasion of other part of head, initial encounter: Secondary | ICD-10-CM | POA: Diagnosis not present

## 2020-11-16 DIAGNOSIS — Z23 Encounter for immunization: Secondary | ICD-10-CM

## 2020-11-16 DIAGNOSIS — R0781 Pleurodynia: Secondary | ICD-10-CM | POA: Diagnosis not present

## 2020-11-16 NOTE — Progress Notes (Signed)
   Covid-19 Vaccination Clinic  Name:  Kent Flowers    MRN: [id number] DOB: 05-21-1946  11/16/2020  [name] was observed post Covid-19 immunization for 15 minutes without incident. He was provided with Vaccine Information Sheet and instruction to access the V-Safe system.   [name] was instructed to call 911 with any severe reactions post vaccine: Marland Kitchen Difficulty breathing  . Swelling of face and throat  . A fast heartbeat  . A bad rash all over body  . Dizziness and weakness   Immunizations Administered    No immunizations on file.

## 2020-11-17 ENCOUNTER — Other Ambulatory Visit: Payer: Self-pay | Admitting: *Deleted

## 2020-11-17 NOTE — Patient Outreach (Signed)
[place] [facility]) Care Management  11/17/2020  Kent Flowers [facility] 09/30/1946 [id number]   Outgoing call to member, no answer, HIPAA compliant voice message left.  Will follow up within the next 3-4 business days.  Valente David, [place], MSN [place] [phone]

## 2020-11-18 DIAGNOSIS — Z9181 History of falling: Secondary | ICD-10-CM | POA: Diagnosis not present

## 2020-11-18 DIAGNOSIS — I10 Essential (primary) hypertension: Secondary | ICD-10-CM | POA: Diagnosis not present

## 2020-11-18 DIAGNOSIS — S0081XA Abrasion of other part of head, initial encounter: Secondary | ICD-10-CM | POA: Diagnosis not present

## 2020-11-18 DIAGNOSIS — R0781 Pleurodynia: Secondary | ICD-10-CM | POA: Diagnosis not present

## 2020-11-18 DIAGNOSIS — E782 Mixed hyperlipidemia: Secondary | ICD-10-CM | POA: Diagnosis not present

## 2020-11-23 ENCOUNTER — Other Ambulatory Visit: Payer: Self-pay | Admitting: *Deleted

## 2020-11-23 NOTE — Patient Outreach (Signed)
[place] [facility]) Care Management  11/23/2020  Conlin Brahm [facility] [birth year] [id number]   Outreach attempt #2 unsuccessful, HIPAA compliant voice message left.  Will send unsuccessful outreach letter and follow up within the next 3-4 business days.  Valente David, [place], MSN [place] [phone]

## 2020-11-24 ENCOUNTER — Other Ambulatory Visit: Payer: Self-pay | Admitting: *Deleted

## 2020-11-24 ENCOUNTER — Ambulatory Visit: Payer: Self-pay | Admitting: *Deleted

## 2020-11-24 NOTE — Patient Outreach (Signed)
[place] [facility]) Care Management  11/24/2020  Harinder Romas [facility] 17-Oct-1946 [id number]   Incoming call received from member after missed call yesterday.  He report he is doing "about the same."  State he no longer is in need of an ERCP, bile duct has cleared on its own.  Last liver enzymes are reportedly normal, will have follow up labs and follow up with GI next week.  He remains concerned about the need for knee surgery (state this was the plan prior to start of spinal infections) however unable to do so until infection has completely cleared.  Continues to take antibiotics, will have follow up with ID in March.  Denies any urgent concerns, agrees to have this care manager follow up within the next month.  Goals Addressed            This Visit's Progress   . THN - Cope with Pain   On track    Follow Up Date 12/25/2020   - learn relaxation techniques - use relaxation during pain    Why is this important?   Living with back pain and enjoying your life may be hard.  Feelings, such as depression or anger, can make your pain worse.  Learning ways to cope may help you find some relief from the pain.    Notes: Recurrent spinal infection  12/8 - Discussed ongoing use of antibiotics for spinal infection    . THN - Keep Pain Under Control   On track    Follow Up Date 12/25/2020   - develop a personal pain management plan - stay active    Why is this important?   Day-to-day life can be hard when you have back pain.  Pain medicine is just one piece of the treatment puzzle. There are many things you can do to manage pain and keep your back strong.   Lifestyle changes, like stopping smoking and eating foods with Vitamin D and calcium, keep your bones and muscles healthy. Your back is better when it is supported by strong muscles.  You can try these action steps to help you manage your pain.     Notes  12/8 - Discussed ongoing pain management interventions    . COMPLETED: THN  - Make and Keep All Appointments       Follow Up Date 10/18/20   - call to cancel if needed - keep a calendar with appointment dates    Why is this important?   Part of staying healthy is seeing the doctor for follow-up care.  If you forget your appointments, there are some things you can do to stay on track.    Notes: MRI 10/5, follow up with ID after MRI Pulmonology [place], RN, MSN [place] Manager [phone]

## 2020-11-29 ENCOUNTER — Ambulatory Visit: Payer: Self-pay | Admitting: *Deleted

## 2020-12-02 DIAGNOSIS — M25561 Pain in right knee: Secondary | ICD-10-CM | POA: Diagnosis not present

## 2020-12-02 DIAGNOSIS — Z79899 Other long term (current) drug therapy: Secondary | ICD-10-CM | POA: Diagnosis not present

## 2020-12-02 DIAGNOSIS — M25562 Pain in left knee: Secondary | ICD-10-CM | POA: Diagnosis not present

## 2020-12-02 DIAGNOSIS — G8929 Other chronic pain: Secondary | ICD-10-CM | POA: Diagnosis not present

## 2020-12-22 ENCOUNTER — Other Ambulatory Visit: Payer: Self-pay | Admitting: *Deleted

## 2020-12-22 NOTE — Patient Outreach (Signed)
[facility]) Care Management  [facility] Care Manager  12/22/2020   Merl Bommarito [facility] 09-Sep-1946 [id number]   Call placed to member, no answer. HIPAA compliant voice message left, will follow up within the next 3-4 business days.  Kemper Durie, [place], MSN [facility] Care Management  [facility] Manager [phone]

## 2020-12-27 ENCOUNTER — Other Ambulatory Visit: Payer: Self-pay | Admitting: *Deleted

## 2020-12-27 NOTE — Patient Outreach (Addendum)
[place] [facility]) Care Management  [place]  12/27/2020   [name] [facility] 05/05/1946 [id number]   Outreach attempt #2, unsuccessful, HIPAA complaint voice message left.  Will send outreach letter and follow up within the next 3-4 business days.     Update:  Call received back from member, state he is doing well.  Continues to take long term oral antibiotics for spinal infection, will have follow up with ID in March.  Report pain is controlled.  He denies any further complex needs as at this time he is waiting for infection to completely clear before having knee surgery.  Discussed ongoing chronic disease management related to history of COPD, state this has been well managed for years.  Denies the need but agrees to call this care manager back in the future if needs change.  Will close case at this time and notify PCP of case closure.  Goals Addressed            This Visit's Progress   . COMPLETED: THN - Cope with Pain       Follow Up Date 12/25/2020   - learn relaxation techniques - use relaxation during pain    Why is this important?   Living with back pain and enjoying your life may be hard.  Feelings, such as depression or anger, can make your pain worse.  Learning ways to cope may help you find some relief from the pain.    Notes: Recurrent spinal infection  12/8 - Discussed ongoing use of antibiotics for spinal infection    . COMPLETED: THN - Keep Pain Under Control       Follow Up Date 12/25/2020   - develop a personal pain management plan - stay active    Why is this important?   Day-to-day life can be hard when you have back pain.  Pain medicine is just one piece of the treatment puzzle. There are many things you can do to manage pain and keep your back strong.   Lifestyle changes, like stopping smoking and eating foods with Vitamin D and calcium, keep your bones and muscles healthy. Your back is better when it is supported by strong muscles.   You can try these action steps to help you manage your pain.     Notes  12/8 - Discussed ongoing pain management interventions        Valente David, RN, MSN [place] [phone]

## 2020-12-31 ENCOUNTER — Ambulatory Visit: Payer: Self-pay | Admitting: *Deleted

## 2021-03-16 ENCOUNTER — Ambulatory Visit: Payer: Medicare Other | Admitting: Infectious Disease

## 2021-03-16 ENCOUNTER — Other Ambulatory Visit: Payer: Self-pay

## 2021-03-16 ENCOUNTER — Encounter: Payer: Self-pay | Admitting: Infectious Disease

## 2021-03-16 VITALS — BP 112/65 | HR 83 | Temp 98.5°F | Ht 67.0 in | Wt 157.0 lb

## 2021-03-16 DIAGNOSIS — M4643 Discitis, unspecified, cervicothoracic region: Secondary | ICD-10-CM

## 2021-03-16 DIAGNOSIS — G061 Intraspinal abscess and granuloma: Secondary | ICD-10-CM | POA: Diagnosis not present

## 2021-03-16 DIAGNOSIS — M4620 Osteomyelitis of vertebra, site unspecified: Secondary | ICD-10-CM | POA: Diagnosis not present

## 2021-03-16 DIAGNOSIS — M2578 Osteophyte, vertebrae: Secondary | ICD-10-CM

## 2021-03-16 DIAGNOSIS — K8050 Calculus of bile duct without cholangitis or cholecystitis without obstruction: Secondary | ICD-10-CM | POA: Diagnosis not present

## 2021-03-16 DIAGNOSIS — R911 Solitary pulmonary nodule: Secondary | ICD-10-CM

## 2021-03-16 DIAGNOSIS — B379 Candidiasis, unspecified: Secondary | ICD-10-CM

## 2021-03-16 MED ORDER — NYSTATIN 100000 UNIT/GM EX POWD: 1.0000 "application " | Freq: Three times a day (TID) | CUTANEOUS | 4 refills | Status: DC
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
## 2021-03-17 ENCOUNTER — Telehealth: Payer: Self-pay

## 2021-03-17 LAB — CBC WITH DIFFERENTIAL/PLATELET
Absolute Monocytes: 669 cells/uL (ref 200–950)
Basophils Absolute: 49 cells/uL (ref 0–200)
Basophils Relative: 0.5 %
Eosinophils Absolute: 291 cells/uL (ref 15–500)
Eosinophils Relative: 3 %
HCT: 42.4 % (ref 38.5–50.0)
Hemoglobin: 13.9 g/dL (ref 13.2–17.1)
Lymphs Abs: 2813 cells/uL (ref 850–3900)
MCH: 29.8 pg (ref 27.0–33.0)
MCHC: 32.8 g/dL (ref 32.0–36.0)
MCV: 91 fL (ref 80.0–100.0)
MPV: 9.2 fL (ref 7.5–12.5)
Monocytes Relative: 6.9 %
Neutro Abs: 5878 cells/uL (ref 1500–7800)
Neutrophils Relative %: 60.6 %
Platelets: 346 10*3/uL (ref 140–400)
RBC: 4.66 10*6/uL (ref 4.20–5.80)
RDW: 12.8 % (ref 11.0–15.0)
Total Lymphocyte: 29 %
WBC: 9.7 10*3/uL (ref 3.8–10.8)

## 2021-03-17 LAB — COMPLETE METABOLIC PANEL WITH GFR
AG Ratio: 1.2 (calc) (ref 1.0–2.5)
ALT: 11 U/L (ref 9–46)
AST: 14 U/L (ref 10–35)
Albumin: 4.1 g/dL (ref 3.6–5.1)
Alkaline phosphatase (APISO): 105 U/L (ref 35–144)
BUN/Creatinine Ratio: 21 (calc) (ref 6–22)
BUN: 29 mg/dL — ABNORMAL HIGH (ref 7–25)
CO2: 30 mmol/L (ref 20–32)
Calcium: 9.5 mg/dL (ref 8.6–10.3)
Chloride: 103 mmol/L (ref 98–110)
Creat: 1.36 mg/dL — ABNORMAL HIGH (ref 0.70–1.18)
GFR, Est African American: 59 mL/min/{1.73_m2} — ABNORMAL LOW (ref >=60)
GFR, Est Non African American: 51 mL/min/{1.73_m2} — ABNORMAL LOW (ref >=60)
Globulin: 3.3 g/dL (calc) (ref 1.9–3.7)
Glucose, Bld: 97 mg/dL (ref 65–99)
Potassium: 4.3 mmol/L (ref 3.5–5.3)
Sodium: 140 mmol/L (ref 135–146)
Total Bilirubin: 0.4 mg/dL (ref 0.2–1.2)
Total Protein: 7.4 g/dL (ref 6.1–8.1)

## 2021-03-17 LAB — SEDIMENTATION RATE: Sed Rate: 17 mm/h (ref 0–20)

## 2021-03-17 LAB — C-REACTIVE PROTEIN: CRP: 8.5 mg/L — ABNORMAL HIGH (ref <8.0)

## 2021-03-17 NOTE — Telephone Encounter (Signed)
Attempted to call patient to relay message from Dr. Tommy Medal, no answer. Left HIPAA compliant voicemail requesting callback.   Beryle Flock, RN

## 2021-03-17 NOTE — Telephone Encounter (Signed)
-----   Message from Truman Hayward, MD sent at 03/17/2021  8:50 AM EDT ----- Patient's inflammatory markers have normalized. With that and him diong well and reassuring MRI he can stop his doxycycline and keep appt w me we sheduled. Can we make sure he stops it?

## 2021-03-17 NOTE — Telephone Encounter (Signed)
Received return call from patient. RN relayed per Dr. Tommy Medal that his inflammatory markers have normalized and that Dr. Tommy Medal would like for him to stop taking his doxycyline. Advised patient to keep follow-up appointment with Dr. Tommy Medal. Patient verbalized understanding and has no further questions.   Beryle Flock, RN

## 2021-05-13 ENCOUNTER — Ambulatory Visit (INDEPENDENT_AMBULATORY_CARE_PROVIDER_SITE_OTHER): Payer: Medicare Other | Admitting: Infectious Disease

## 2021-05-13 ENCOUNTER — Other Ambulatory Visit: Payer: Self-pay

## 2021-05-13 VITALS — BP 123/70 | HR 75 | Temp 98.5°F | Wt 159.0 lb

## 2021-05-13 DIAGNOSIS — M4620 Osteomyelitis of vertebra, site unspecified: Secondary | ICD-10-CM

## 2021-05-13 DIAGNOSIS — K8050 Calculus of bile duct without cholangitis or cholecystitis without obstruction: Secondary | ICD-10-CM

## 2021-05-13 DIAGNOSIS — R911 Solitary pulmonary nodule: Secondary | ICD-10-CM

## 2021-05-13 DIAGNOSIS — M4643 Discitis, unspecified, cervicothoracic region: Secondary | ICD-10-CM

## 2021-05-13 DIAGNOSIS — M1711 Unilateral primary osteoarthritis, right knee: Secondary | ICD-10-CM

## 2021-05-13 DIAGNOSIS — G061 Intraspinal abscess and granuloma: Secondary | ICD-10-CM | POA: Diagnosis not present
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
## 2021-05-14 LAB — BASIC METABOLIC PANEL WITH GFR
BUN/Creatinine Ratio: 22 (calc) (ref 6–22)
BUN: 26 mg/dL — ABNORMAL HIGH (ref 7–25)
CO2: 30 mmol/L (ref 20–32)
Calcium: 9.2 mg/dL (ref 8.6–10.3)
Chloride: 101 mmol/L (ref 98–110)
Creat: 1.19 mg/dL — ABNORMAL HIGH (ref 0.70–1.18)
GFR, Est African American: 70 mL/min/{1.73_m2} (ref >=60)
GFR, Est Non African American: 60 mL/min/{1.73_m2} (ref >=60)
Glucose, Bld: 97 mg/dL (ref 65–99)
Potassium: 4.6 mmol/L (ref 3.5–5.3)
Sodium: 139 mmol/L (ref 135–146)

## 2021-05-14 LAB — SEDIMENTATION RATE: Sed Rate: 31 mm/h — ABNORMAL HIGH (ref 0–20)

## 2021-05-14 LAB — C-REACTIVE PROTEIN: CRP: 23.9 mg/L — ABNORMAL HIGH (ref <8.0)

## 2021-07-11 DIAGNOSIS — H9113 Presbycusis, bilateral: Secondary | ICD-10-CM | POA: Insufficient documentation

## 2021-07-11 DIAGNOSIS — H6121 Impacted cerumen, right ear: Secondary | ICD-10-CM | POA: Insufficient documentation

## 2021-09-22 ENCOUNTER — Ambulatory Visit: Payer: Medicare Other | Admitting: Internal Medicine

## 2021-09-26 NOTE — Progress Notes (Signed)
HPI male former smoker followed for COPD, history lung nodules, complicated by osteoarthritis, ascending aortic aneurysm Spirometry 01/24/12- FVC 2.24/ 64%, FEV1 1.20/ 42%, FEV1/FVC 0.53 PFT: 03/19/2012-moderate obstructive airways disease, insignificant response to bronchodilator , air trapping,normal diffusion capacity. Loop contour suggests emphysema. FVC 3.73/91%, FEV1 2.07/73%, FEV1/FVC 0.56. TLC 114%, RV 138% , DLCO 90% CT chest 10/01/13- Visualized small lung nodules are benign based on current consensus criteria  Office Spirometry 05/03/17-moderately severe obstruction. FVC 3.25/79%, FEV1 1.77/58%, ratio 0.54, FEF 25-75% 0.88/38%  -------------------------------------------------------------------------------------------------------------   09/21/20-  75 year old male former smoker followed for COPD, history lung nodules , R apical Lung Mass (resolved), T-Spine Epidural Abscess MSSA, BPH, Aortic Aneurysm, CAD, Hepatitis,  -----Patient feels like his breathing is the same as last visit, was in the hospital beginning of year with MSSA Proair HFA,       Quit Anoro- not helpful Covid vax- he reports 2 Moderna, no card Flu vax- He thinks he got flu and pneumonia vax from one of his doctor visits last week, but can't remember, outside our system, and he comments on how very many doctors he sees. No longer drives. 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   Breathing is stable with light cough, clear mucus. Uses only albuterol hfa 2 puffs bid and doesn't fel need for anything else.  PET scan 02/11/20-  IMPRESSION: 1. Previously seen abnormal soft tissue at the apex of the right hemithorax has resolved in the interval. There is a minimally hypermetabolic residual low right paratracheal lymph node, which  may be reactive. 2. Mild hypermetabolism within the head of the pancreas, without a definite CT correlate. Finding is nonspecific. If further evaluation is desired, MR abdomen without and with contrast is suggested. 3. Bilateral hydronephrosis with marked bladder distension. 4. Borderline enlarged inguinal lymph nodes without abnormal hypermetabolism, likely reactive. 5. Postoperative changes in the lower cervical and upper thoracic spine, with expected mild hypermetabolism. Associated focal fluid collection posterior to the upper thoracic spine may represent a seroma. Difficult to exclude abscess. Please refer to thoracic spine MR 02/04/2020. 6. Aortic atherosclerosis (ICD10-I70.0). Coronary artery calcification.  09/27/21- 75 year old male former smoker followed for COPD, history lung nodules , R apical Lung Mass (resolved), T-Spine Epidural Abscess MSSA, BPH, Aortic Aneurysm, CAD, Hepatitis,  -Proair hfa,  Covid vax-3 Moderna Flu vax-today Recent cholecystectomy No Covid infection. R arm paresis following Spine surgery for abscess. No major respiratory events. Uses rescue inhaler 3x/ day- in anticipation. Rarely hears himself wheezing, but hearing poor. Doesn't often push to DOE.  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   ROS-see HPI      + = positive Constitutional:   No-   weight loss, night sweats,  chills, fatigue, lassitude. HEENT:   No-  headaches, difficulty swallowing, tooth/dental problems,        No-  sneezing, itching, ear ache,  +nasal congestion, post nasal drip,  CV:  No- chest pain, orthopnea, PND, swelling in lower extremities, anasarca,  dizziness, palpitations Resp: +   shortness of breath with exertion or at rest.              +productive cough, no- non-productive cough,  No- coughing up of blood.             No change in color of mucus.  + wheezing.   Skin:  No-   rash or lesions. GI:  No-   heartburn, indigestion, abdominal pain, nausea,  vomiting GU: No-   dysuria,. MS:  +  joint pain or swelling.     Neuro-     nothing unusual Psych:  No- change in mood or affect. No depression or anxiety.  No memory loss.  OBJ- Physical Exam General- Alert, Oriented, Affect-appropriate, Distress- none acute, + slender Skin- rash-none, lesions- none, excoriation- none Lymphadenopathy- none Head- atraumatic            Eyes- Gross vision intact, PERRLA, conjunctivae and secretions clear            Ears- Hearing,             Nose- Clear, no-Septal dev, mucus, polyps, erosion, perforation             Throat- Mallampati II , mucosa clear , drainage- none, tonsils- atrophic Neck- flexible , trachea midline, no stridor , thyroid nl, carotid no bruit Chest - symmetrical excursion , unlabored           Heart/CV- RRR , no murmur , no gallop  , no rub, nl s1 s2                           - JVD- none , edema- none, stasis changes- none, varices- none           Lung- + diminished, wheeze+bilateral, cough- none , dullness-none, rub- none           Chest wall-  Abd-  Br/ Gen/ Rectal- Not done, not indicated Extrem- cyanosis- none, clubbing, none, atrophy- none, strength- nl Neuro- + paresis R arm

## 2021-09-27 ENCOUNTER — Encounter: Payer: Self-pay | Admitting: Internal Medicine

## 2021-09-27 ENCOUNTER — Ambulatory Visit: Payer: Medicare Other | Admitting: Internal Medicine

## 2021-09-27 ENCOUNTER — Other Ambulatory Visit: Payer: Self-pay

## 2021-09-27 ENCOUNTER — Ambulatory Visit (INDEPENDENT_AMBULATORY_CARE_PROVIDER_SITE_OTHER): Payer: Medicare Other

## 2021-09-27 VITALS — BP 136/60 | HR 87 | Temp 98.9°F | Ht 67.0 in | Wt 156.2 lb

## 2021-09-27 DIAGNOSIS — Z23 Encounter for immunization: Secondary | ICD-10-CM | POA: Diagnosis not present

## 2021-09-27 DIAGNOSIS — R911 Solitary pulmonary nodule: Secondary | ICD-10-CM | POA: Diagnosis not present

## 2021-09-27 DIAGNOSIS — J449 Chronic obstructive pulmonary disease, unspecified: Secondary | ICD-10-CM

## 2021-09-27 IMAGING — DX DG CHEST 2V
2 series · 2 of 2 positions shown · non-contrast
Comparison: [DATE].

CLINICAL DATA: Lung nodules.

EXAM:
CHEST - 2 VIEW

[chest pa]
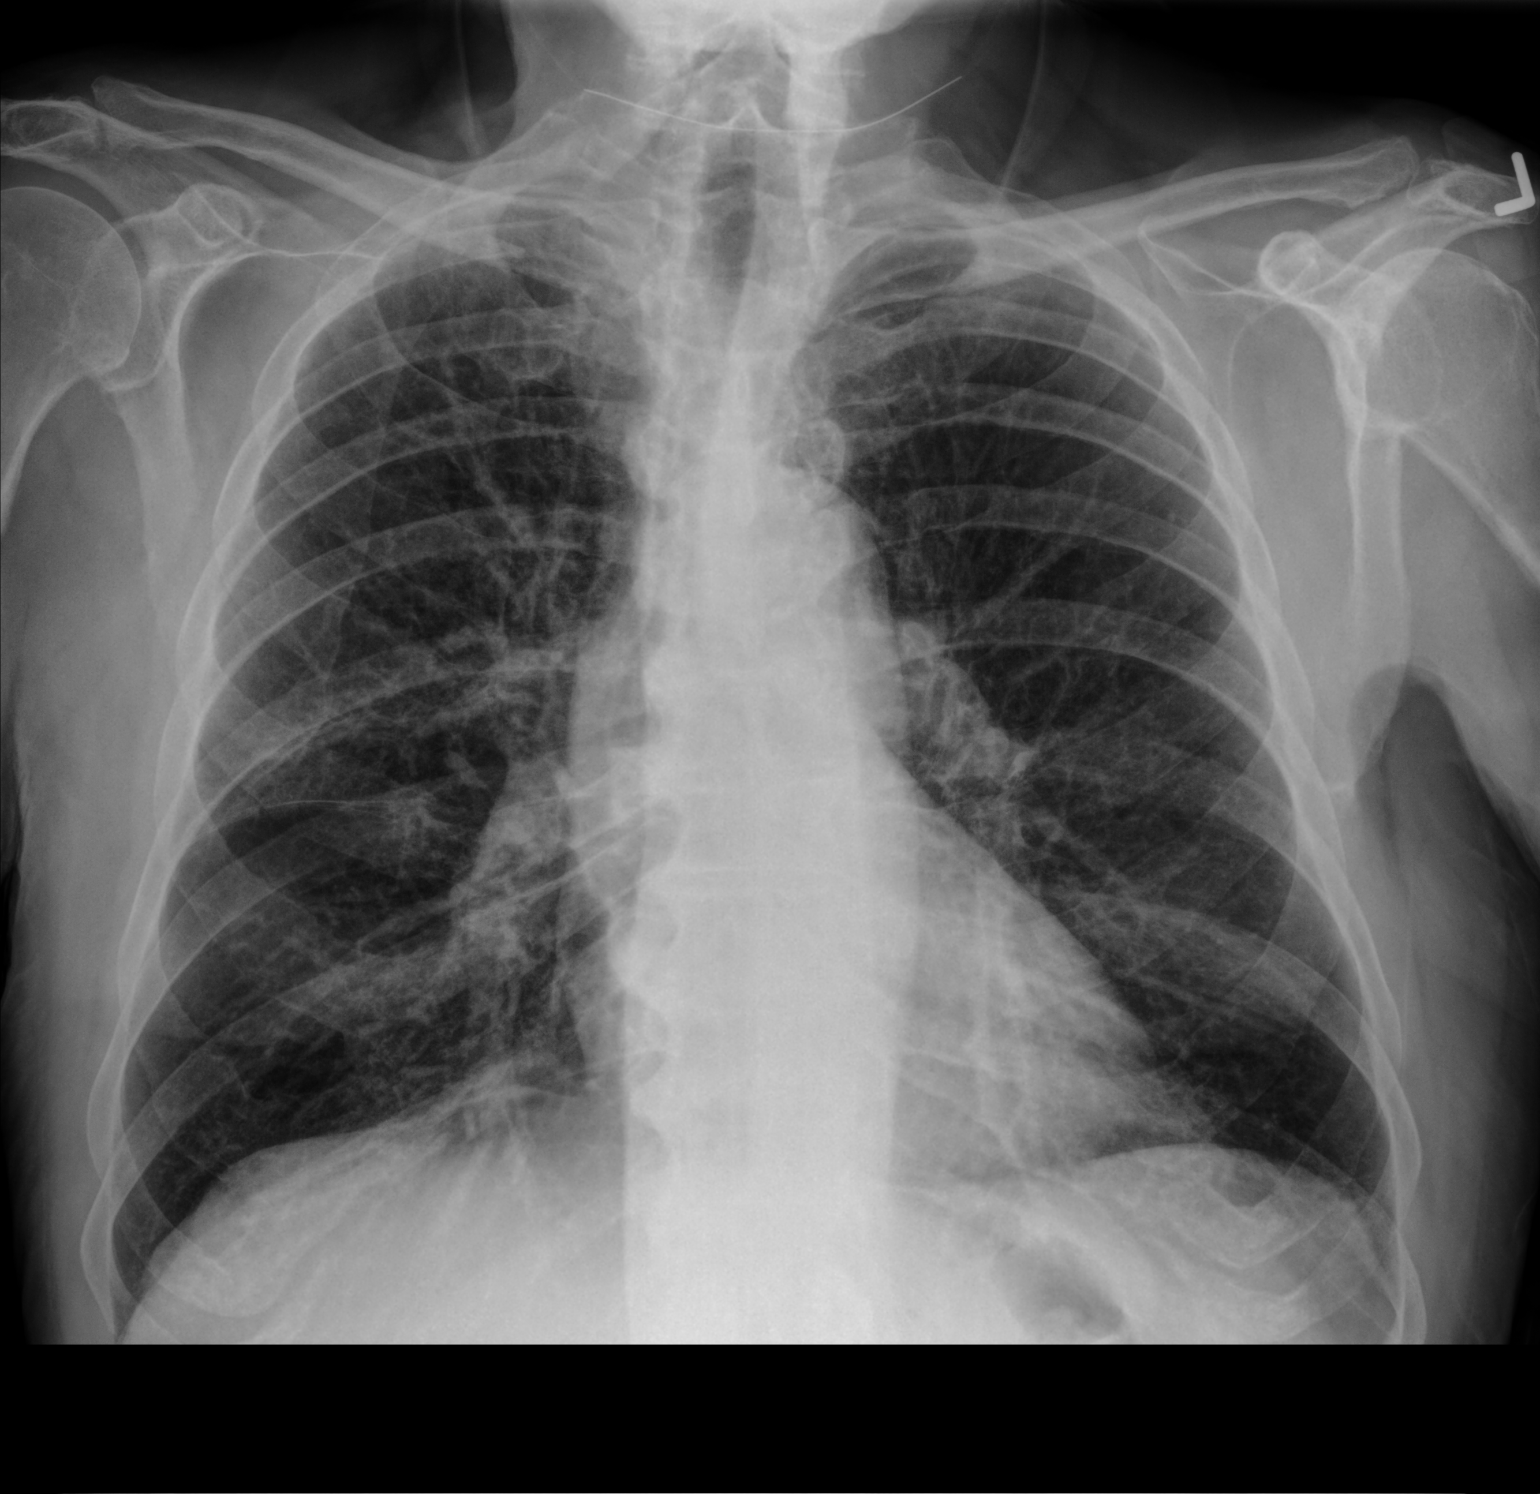

[chest lat]
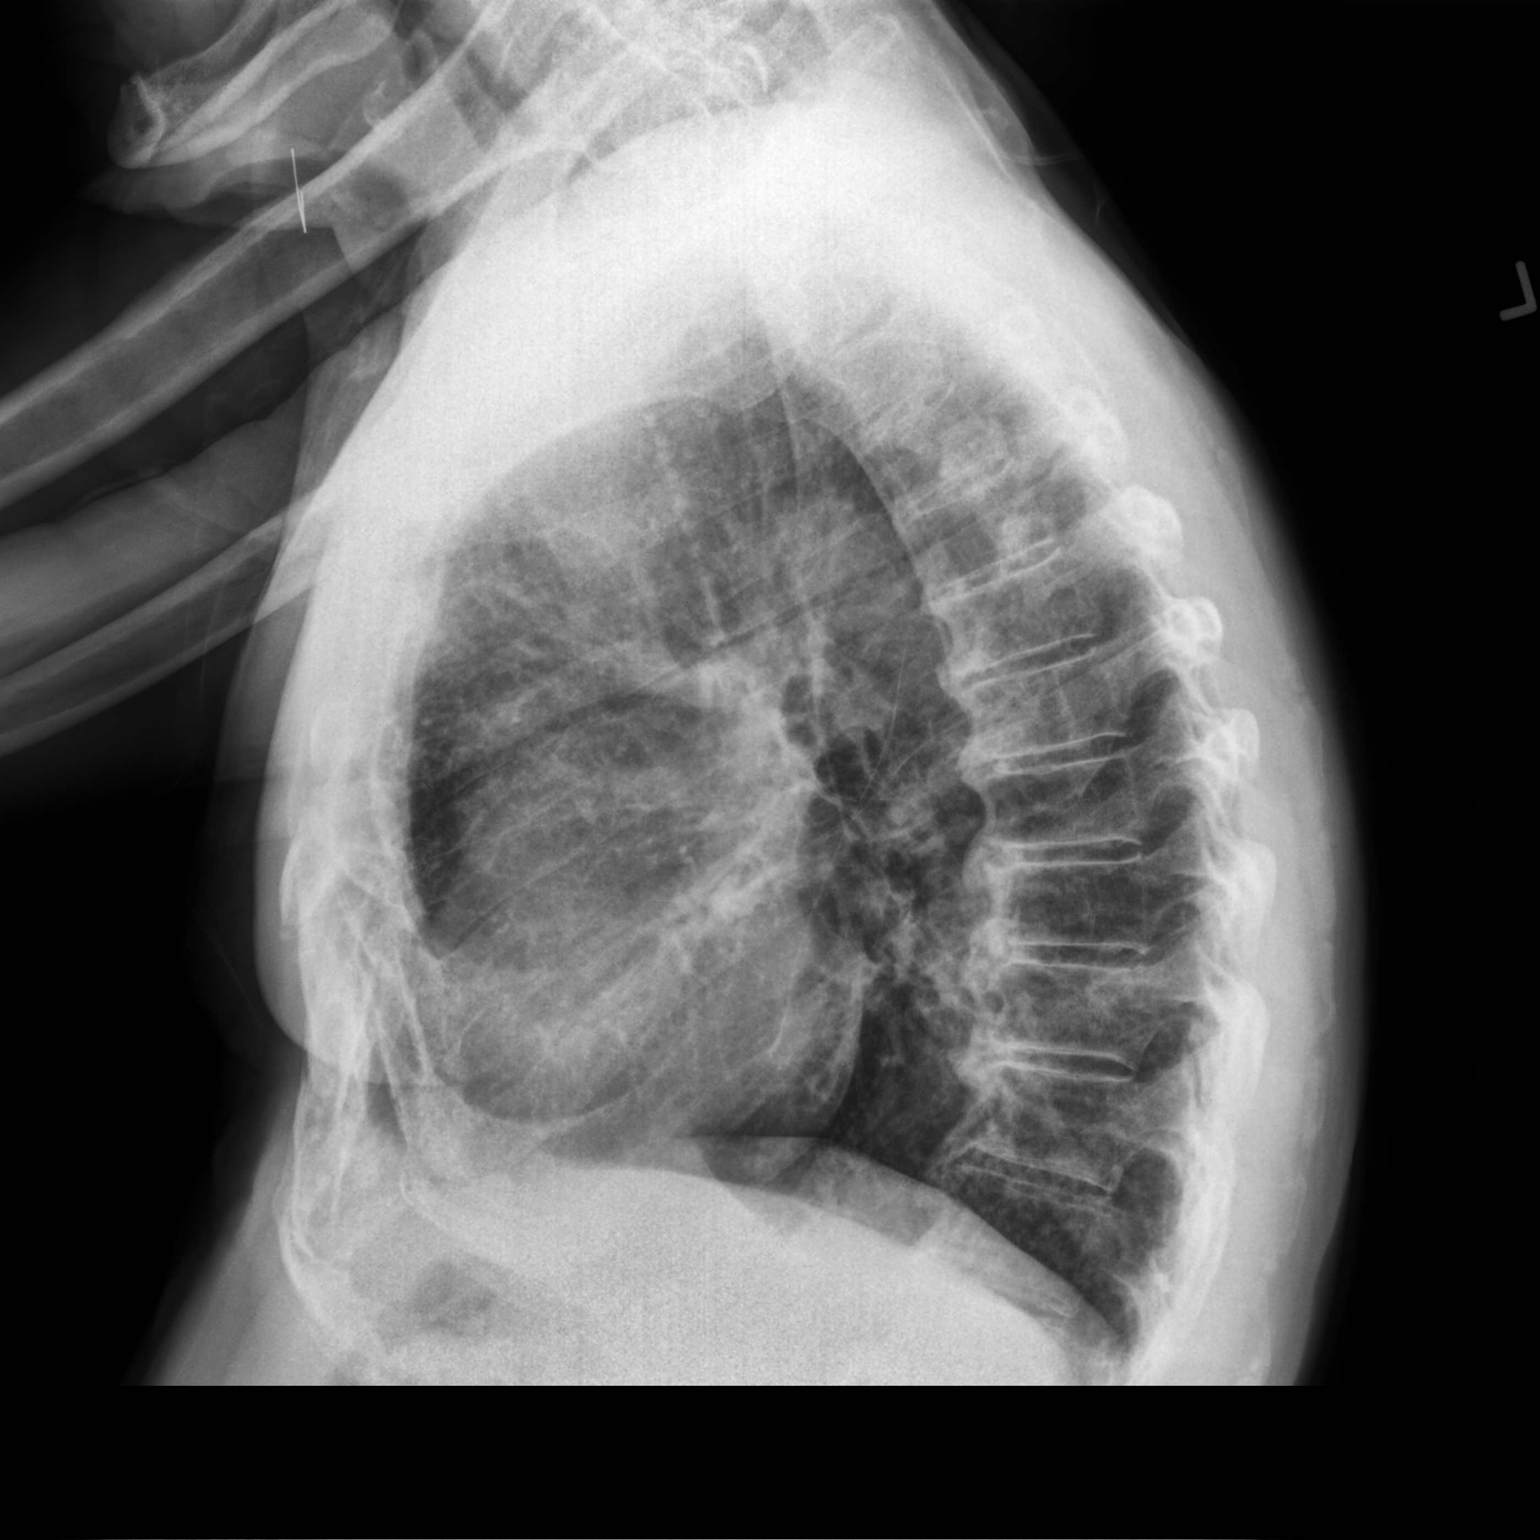

[2 of 2 positions shown; findings below may reference images not displayed]

FINDINGS: The heart size and mediastinal contours are within normal limits.
Both lungs are clear. The visualized skeletal structures are
unremarkable.
IMPRESSION: No active cardiopulmonary disease.

## 2021-09-27 MED ORDER — FLUTICASONE-SALMETEROL 250-50 MCG/ACT IN AEPB: 1.0000 | INHALATION_SPRAY | Freq: Two times a day (BID) | RESPIRATORY_TRACT | 11 refills | Status: DC

## 2021-09-27 MED ORDER — ALBUTEROL SULFATE HFA 108 (90 BASE) MCG/ACT IN AERS: INHALATION_SPRAY | RESPIRATORY_TRACT | 12 refills | Status: DC

## 2021-09-27 NOTE — Patient Instructions (Addendum)
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  Script sent for Advair 250/50 discus inhaler    inhale 1 puff, then rinse mouth, twice daily. Try using this regularly and see if it helps your breathing.   Script sent refilling your Proair rescue inhaler  Please call if we can help

## 2021-09-28 ENCOUNTER — Encounter: Payer: Self-pay | Admitting: Internal Medicine

## 2021-09-28 NOTE — Assessment & Plan Note (Signed)
RUL density had resolved.. Plan- CXR

## 2021-09-28 NOTE — Assessment & Plan Note (Signed)
He can't hear himself wheezing. Plan- add Adair as maintenance inhaler with discussion. Flu vax-senior. DOT form completed.

## 2021-09-29 ENCOUNTER — Telehealth: Payer: Self-pay

## 2022-01-18 ENCOUNTER — Emergency Department (HOSPITAL_COMMUNITY): Payer: Medicare Other

## 2022-01-18 ENCOUNTER — Other Ambulatory Visit: Payer: Self-pay

## 2022-01-18 ENCOUNTER — Encounter (HOSPITAL_COMMUNITY): Payer: Self-pay | Admitting: Emergency Medicine

## 2022-01-18 ENCOUNTER — Inpatient Hospital Stay (HOSPITAL_COMMUNITY)
Admission: EM | Admit: 2022-01-18 | Discharge: 2022-01-20 | DRG: 552 | Disposition: A | Discharge status: Skilled Nursing Facility | Payer: Medicare Other | Attending: Internal Medicine | Admitting: Internal Medicine

## 2022-01-18 DIAGNOSIS — D638 Anemia in other chronic diseases classified elsewhere: Secondary | ICD-10-CM

## 2022-01-18 DIAGNOSIS — R296 Repeated falls: Secondary | ICD-10-CM | POA: Diagnosis present

## 2022-01-18 DIAGNOSIS — L03119 Cellulitis of unspecified part of limb: Secondary | ICD-10-CM

## 2022-01-18 DIAGNOSIS — R0602 Shortness of breath: Secondary | ICD-10-CM

## 2022-01-18 DIAGNOSIS — J439 Emphysema, unspecified: Secondary | ICD-10-CM | POA: Diagnosis present

## 2022-01-18 DIAGNOSIS — R52 Pain, unspecified: Secondary | ICD-10-CM

## 2022-01-18 DIAGNOSIS — N179 Acute kidney failure, unspecified: Secondary | ICD-10-CM

## 2022-01-18 DIAGNOSIS — S12110A Anterior displaced Type II dens fracture, initial encounter for closed fracture: Principal | ICD-10-CM | POA: Diagnosis present

## 2022-01-18 DIAGNOSIS — D631 Anemia in chronic kidney disease: Secondary | ICD-10-CM | POA: Diagnosis present

## 2022-01-18 DIAGNOSIS — Z881 Allergy status to other antibiotic agents status: Secondary | ICD-10-CM

## 2022-01-18 DIAGNOSIS — Z20822 Contact with and (suspected) exposure to covid-19: Secondary | ICD-10-CM | POA: Diagnosis present

## 2022-01-18 DIAGNOSIS — E291 Testicular hypofunction: Secondary | ICD-10-CM | POA: Diagnosis present

## 2022-01-18 DIAGNOSIS — Z79890 Hormone replacement therapy (postmenopausal): Secondary | ICD-10-CM

## 2022-01-18 DIAGNOSIS — S62610A Displaced fracture of proximal phalanx of right index finger, initial encounter for closed fracture: Secondary | ICD-10-CM | POA: Diagnosis present

## 2022-01-18 DIAGNOSIS — L03115 Cellulitis of right lower limb: Secondary | ICD-10-CM | POA: Diagnosis present

## 2022-01-18 DIAGNOSIS — M170 Bilateral primary osteoarthritis of knee: Secondary | ICD-10-CM | POA: Diagnosis present

## 2022-01-18 DIAGNOSIS — S60512A Abrasion of left hand, initial encounter: Secondary | ICD-10-CM | POA: Diagnosis present

## 2022-01-18 DIAGNOSIS — Z7951 Long term (current) use of inhaled steroids: Secondary | ICD-10-CM

## 2022-01-18 DIAGNOSIS — J449 Chronic obstructive pulmonary disease, unspecified: Secondary | ICD-10-CM

## 2022-01-18 DIAGNOSIS — N1831 Chronic kidney disease, stage 3a: Secondary | ICD-10-CM | POA: Diagnosis present

## 2022-01-18 DIAGNOSIS — Z79891 Long term (current) use of opiate analgesic: Secondary | ICD-10-CM

## 2022-01-18 DIAGNOSIS — W19XXXA Unspecified fall, initial encounter: Secondary | ICD-10-CM

## 2022-01-18 DIAGNOSIS — Z882 Allergy status to sulfonamides status: Secondary | ICD-10-CM

## 2022-01-18 DIAGNOSIS — F17210 Nicotine dependence, cigarettes, uncomplicated: Secondary | ICD-10-CM | POA: Diagnosis present

## 2022-01-18 DIAGNOSIS — S12100A Unspecified displaced fracture of second cervical vertebra, initial encounter for closed fracture: Secondary | ICD-10-CM

## 2022-01-18 DIAGNOSIS — M545 Low back pain, unspecified: Secondary | ICD-10-CM | POA: Diagnosis present

## 2022-01-18 DIAGNOSIS — Z789 Other specified health status: Secondary | ICD-10-CM

## 2022-01-18 DIAGNOSIS — J189 Pneumonia, unspecified organism: Secondary | ICD-10-CM

## 2022-01-18 DIAGNOSIS — S62639A Displaced fracture of distal phalanx of unspecified finger, initial encounter for closed fracture: Secondary | ICD-10-CM | POA: Diagnosis present

## 2022-01-18 DIAGNOSIS — N400 Enlarged prostate without lower urinary tract symptoms: Secondary | ICD-10-CM | POA: Diagnosis present

## 2022-01-18 DIAGNOSIS — W010XXA Fall on same level from slipping, tripping and stumbling without subsequent striking against object, initial encounter: Secondary | ICD-10-CM | POA: Diagnosis present

## 2022-01-18 DIAGNOSIS — G8191 Hemiplegia, unspecified affecting right dominant side: Secondary | ICD-10-CM | POA: Diagnosis present

## 2022-01-18 DIAGNOSIS — S0081XA Abrasion of other part of head, initial encounter: Secondary | ICD-10-CM | POA: Diagnosis present

## 2022-01-18 DIAGNOSIS — R918 Other nonspecific abnormal finding of lung field: Secondary | ICD-10-CM | POA: Diagnosis present

## 2022-01-18 DIAGNOSIS — R600 Localized edema: Secondary | ICD-10-CM | POA: Diagnosis present

## 2022-01-18 DIAGNOSIS — Z79899 Other long term (current) drug therapy: Secondary | ICD-10-CM

## 2022-01-18 DIAGNOSIS — Z720 Tobacco use: Secondary | ICD-10-CM

## 2022-01-18 DIAGNOSIS — Z8549 Personal history of malignant neoplasm of other male genital organs: Secondary | ICD-10-CM

## 2022-01-18 DIAGNOSIS — G8929 Other chronic pain: Secondary | ICD-10-CM | POA: Diagnosis present

## 2022-01-18 DIAGNOSIS — Z8614 Personal history of Methicillin resistant Staphylococcus aureus infection: Secondary | ICD-10-CM

## 2022-01-18 LAB — COMPREHENSIVE METABOLIC PANEL
ALT: 11 U/L (ref 0–44)
AST: 17 U/L (ref 15–41)
Albumin: 3 g/dL — ABNORMAL LOW (ref 3.5–5.0)
Alkaline Phosphatase: 102 U/L (ref 38–126)
Anion gap: 7 (ref 5–15)
BUN: 16 mg/dL (ref 8–23)
CO2: 26 mmol/L (ref 22–32)
Calcium: 8.5 mg/dL — ABNORMAL LOW (ref 8.9–10.3)
Chloride: 105 mmol/L (ref 98–111)
Creatinine, Ser: 1.28 mg/dL — ABNORMAL HIGH (ref 0.61–1.24)
GFR, Estimated: 59 mL/min — ABNORMAL LOW (ref >60)
Glucose, Bld: 108 mg/dL — ABNORMAL HIGH (ref 70–99)
Potassium: 3.5 mmol/L (ref 3.5–5.1)
Sodium: 138 mmol/L (ref 135–145)
Total Bilirubin: 0.6 mg/dL (ref 0.3–1.2)
Total Protein: 6.6 g/dL (ref 6.5–8.1)

## 2022-01-18 LAB — CBC WITH DIFFERENTIAL/PLATELET
Abs Immature Granulocytes: 0.01 10*3/uL (ref 0.00–0.07)
Basophils Absolute: 0 10*3/uL (ref 0.0–0.1)
Basophils Relative: 0 %
Eosinophils Absolute: 0.4 10*3/uL (ref 0.0–0.5)
Eosinophils Relative: 5 %
HCT: 34.2 % — ABNORMAL LOW (ref 39.0–52.0)
Hemoglobin: 10.8 g/dL — ABNORMAL LOW (ref 13.0–17.0)
Immature Granulocytes: 0 %
Lymphocytes Relative: 30 %
Lymphs Abs: 2.5 10*3/uL (ref 0.7–4.0)
MCH: 29.1 pg (ref 26.0–34.0)
MCHC: 31.6 g/dL (ref 30.0–36.0)
MCV: 92.2 fL (ref 80.0–100.0)
Monocytes Absolute: 0.6 10*3/uL (ref 0.1–1.0)
Monocytes Relative: 7 %
Neutro Abs: 4.6 10*3/uL (ref 1.7–7.7)
Neutrophils Relative %: 58 %
Platelets: 336 10*3/uL (ref 150–400)
RBC: 3.71 MIL/uL — ABNORMAL LOW (ref 4.22–5.81)
RDW: 13.2 % (ref 11.5–15.5)
WBC: 8.1 10*3/uL (ref 4.0–10.5)
nRBC: 0 % (ref 0.0–0.2)

## 2022-01-18 LAB — RESP PANEL BY RT-PCR (FLU A&B, COVID) ARPGX2
Influenza A by PCR: NEGATIVE
Influenza B by PCR: NEGATIVE
SARS Coronavirus 2 by RT PCR: NEGATIVE

## 2022-01-18 IMAGING — DX DG CHEST 1V PORT
1 series · 1 of 1 positions shown · non-contrast
Comparison: [DATE]

CLINICAL DATA: Fell yesterday

EXAM:
PORTABLE CHEST 1 VIEW

[chest ap]
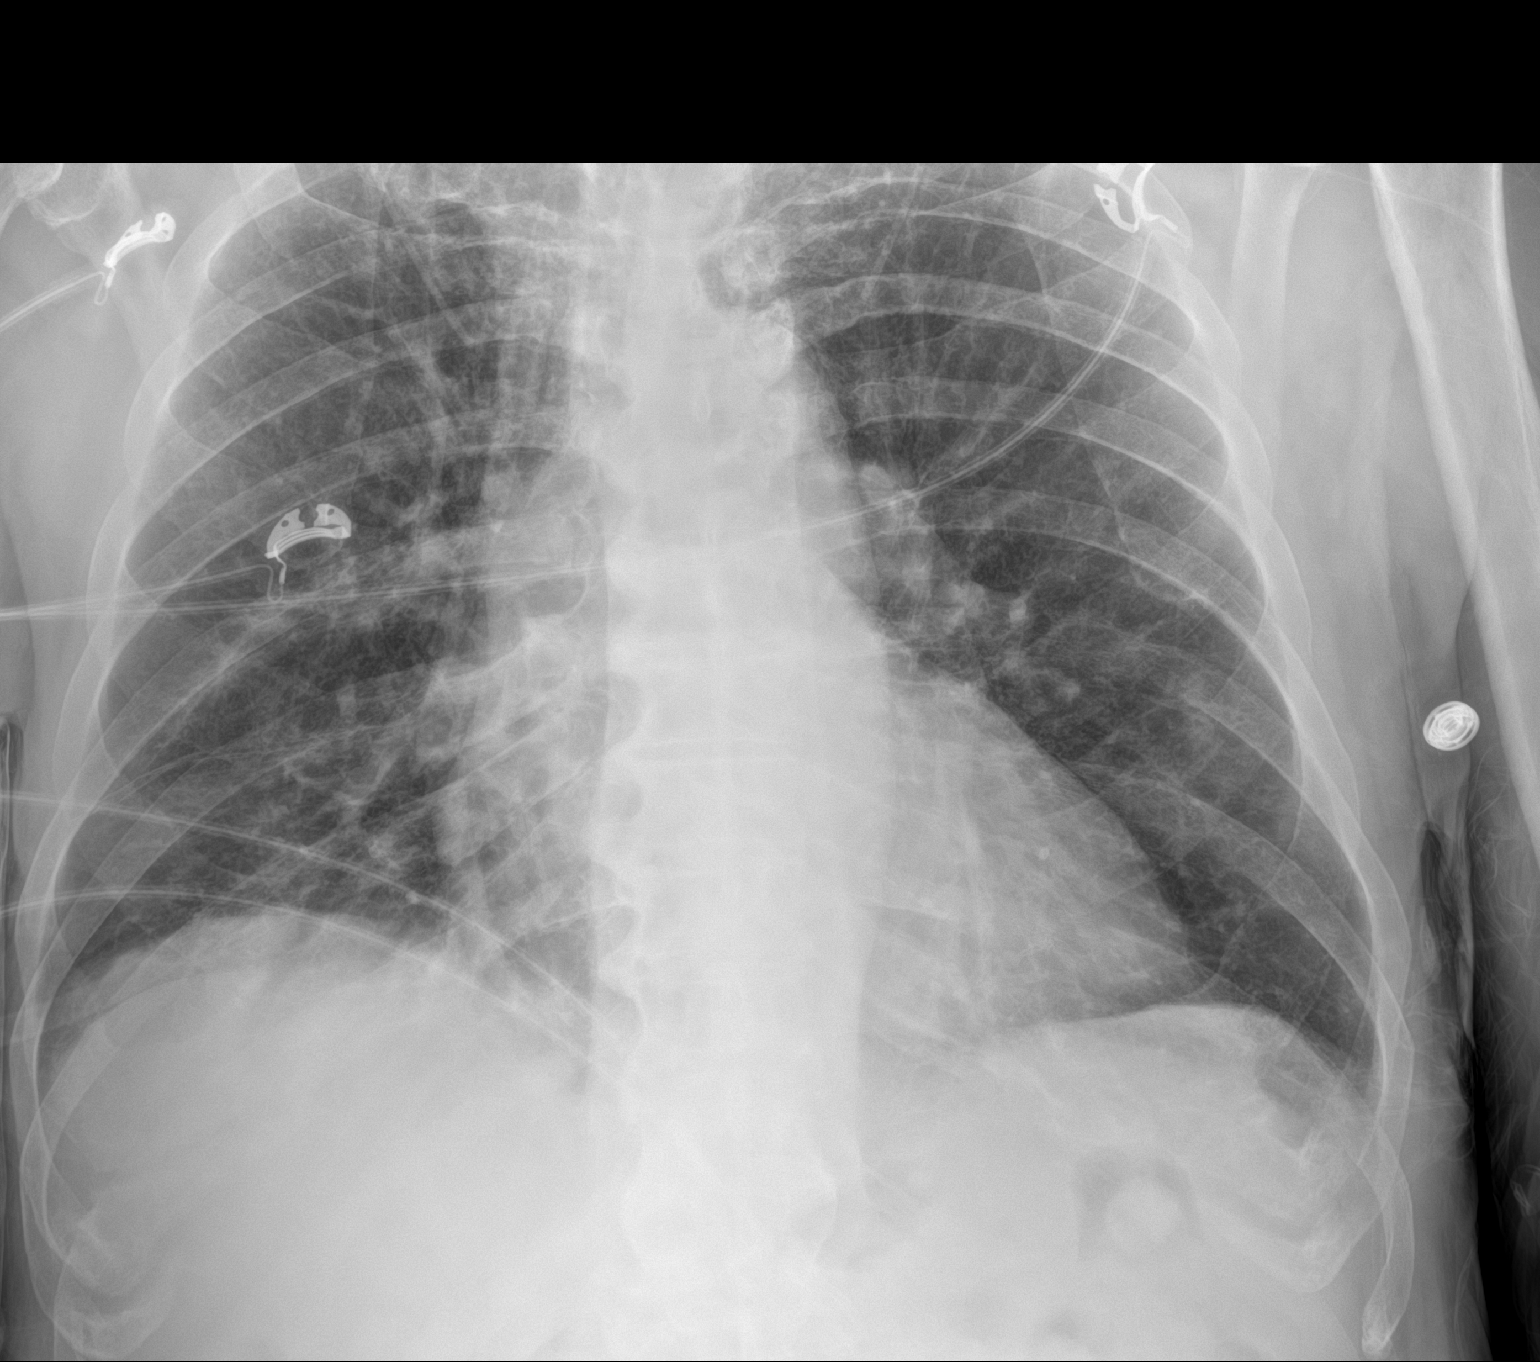

[1 of 1 positions shown; findings below may reference images not displayed]

FINDINGS: Single frontal view of the chest demonstrates an unremarkable
cardiac silhouette allowing for portable AP technique. There is mild
central vascular congestion without airspace disease, effusion, or
pneumothorax. No acute bony abnormalities.
IMPRESSION: 1. Mild central vascular congestion.  No acute airspace disease.

## 2022-01-18 IMAGING — CT CT HEAD W/O CM
4 series · 15 of 47 positions shown, 17 images · non-contrast
Comparison: Cervical spine MRI dated [DATE]. Head CT dated
[DATE].

CLINICAL DATA: Fell yesterday.



[Series 3: cor soft · coronal · 0.32mm/px · 3 of 80 slices shown]
[im 27/80  brain]
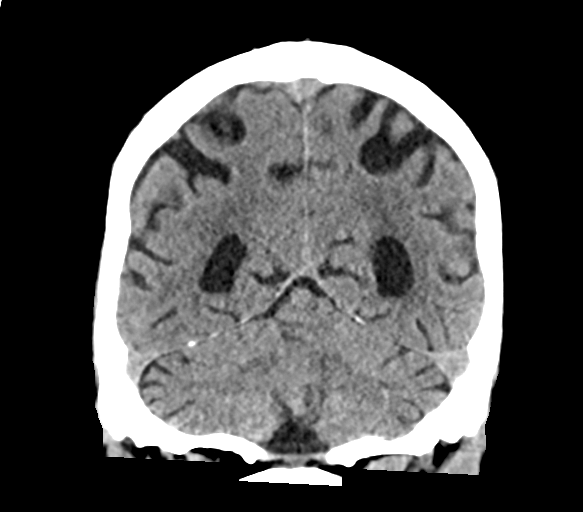
[im 36/80  brain]
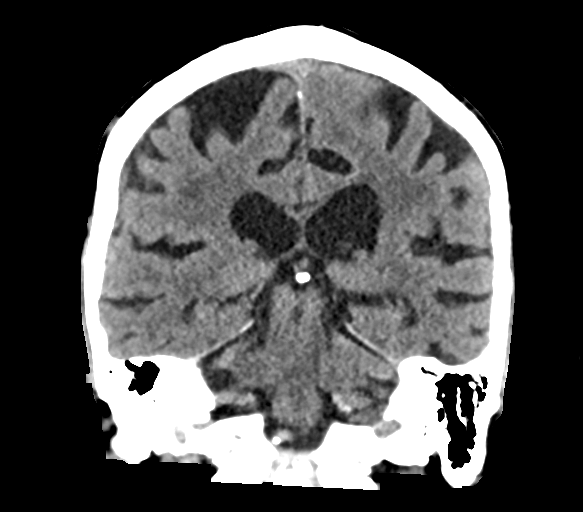
[im 44/80  brain]
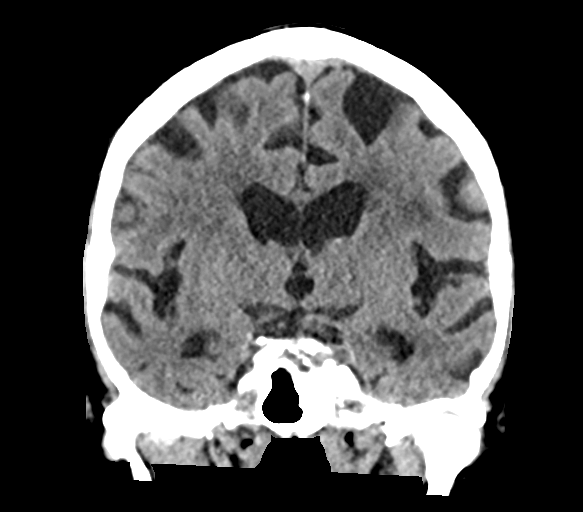

[Series 4: sag soft · sagittal · 0.32mm/px · 3 of 64 slices shown]
[im 22/64  brain]
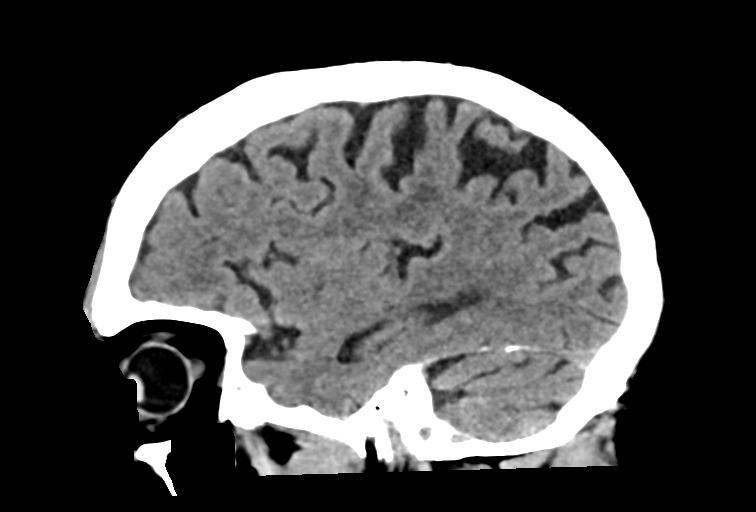
[im 32/64  brain]
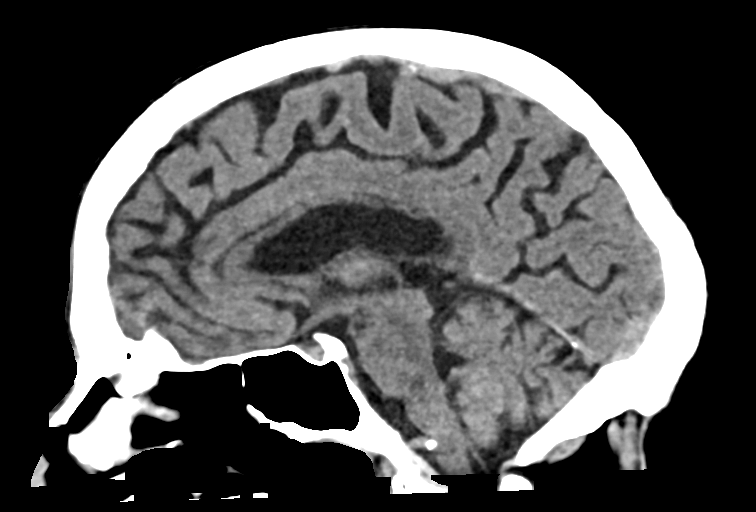
[im 43/64  brain]
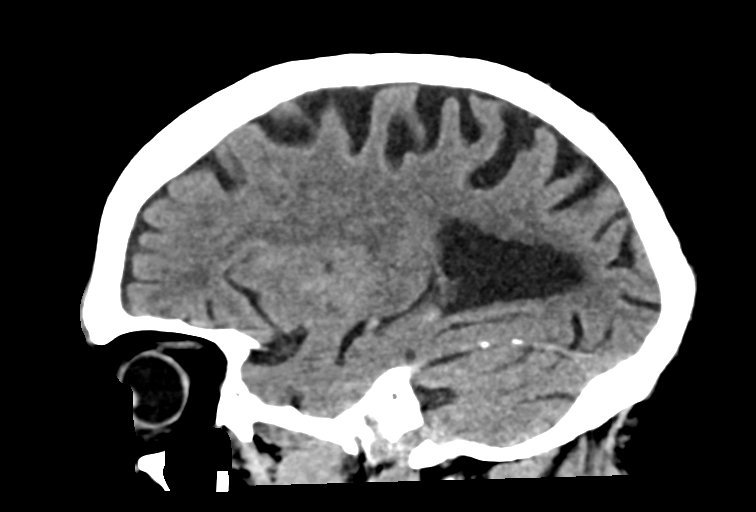

[Series 5: head wo · axial · 0.46mm/px · z∈[+1130,+1250]mm · 7 of 33 slices shown, 9 images]
[im 5/33  brain]
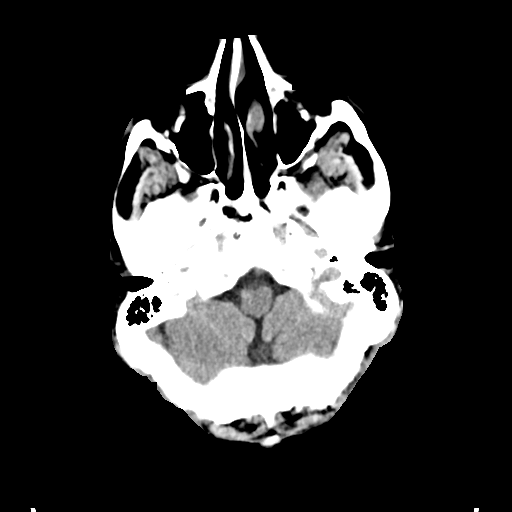
[im 5/33  bone]
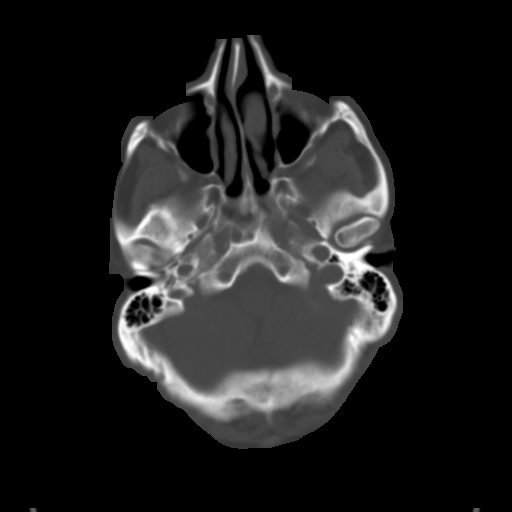
[im 9/33  brain]
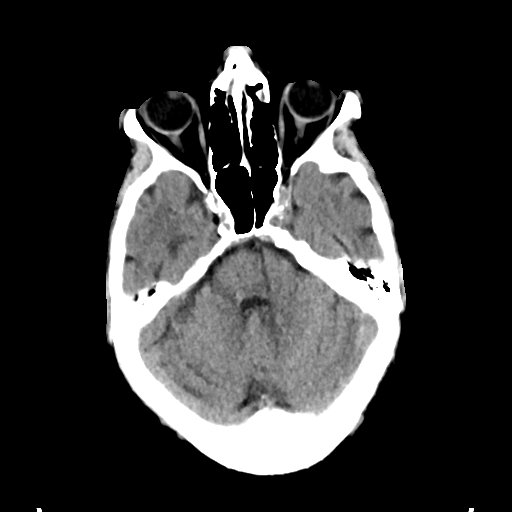
[im 13/33  brain]
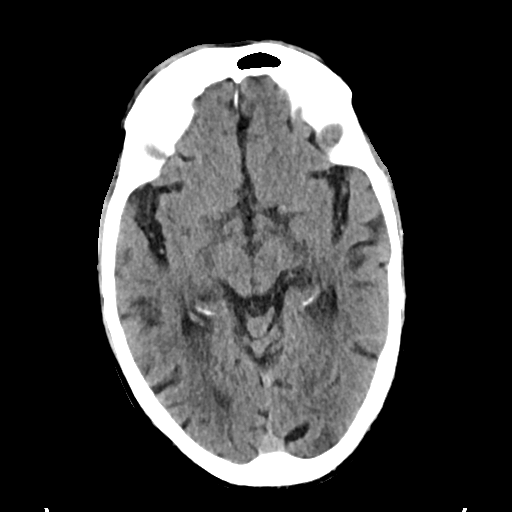
[im 17/33  brain]
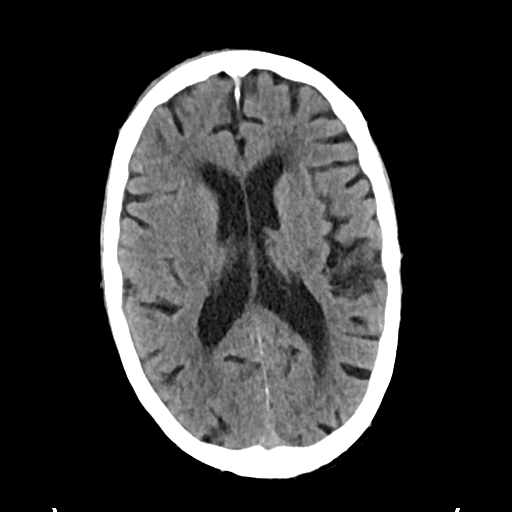
[im 21/33  brain]
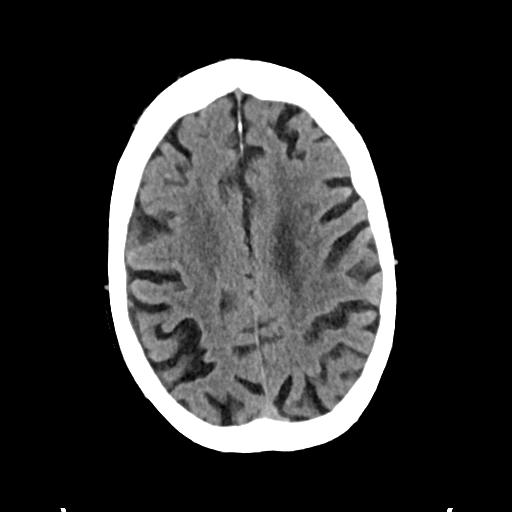
[im 21/33  bone]
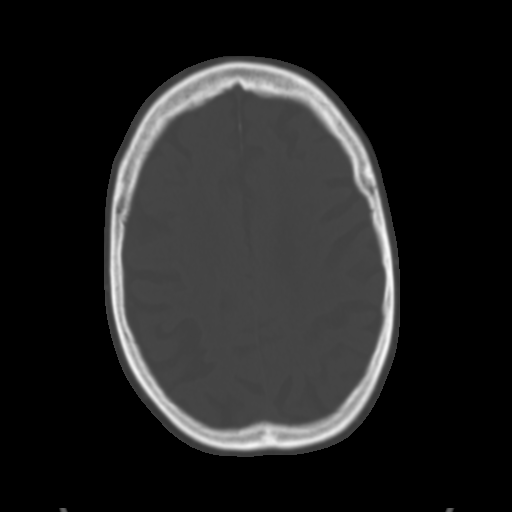
[im 25/33  brain]
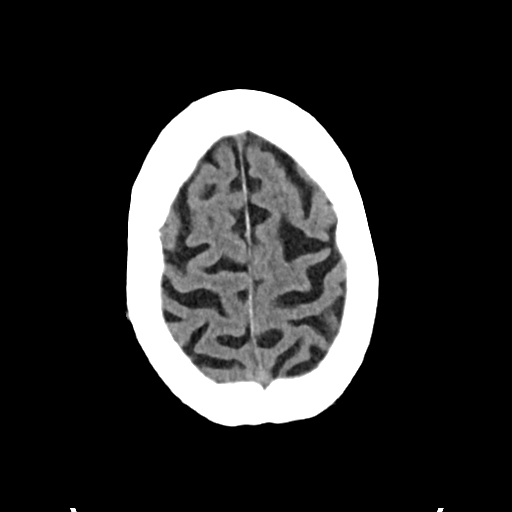
[im 29/33  brain]
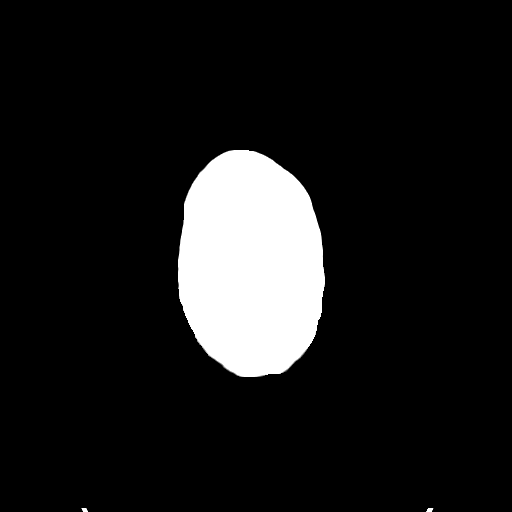

[Series 6: head bone · axial · 0.46mm/px · z∈[+1126,+1142]mm · 2 of 81 slices shown]
[im 9/81  bone]
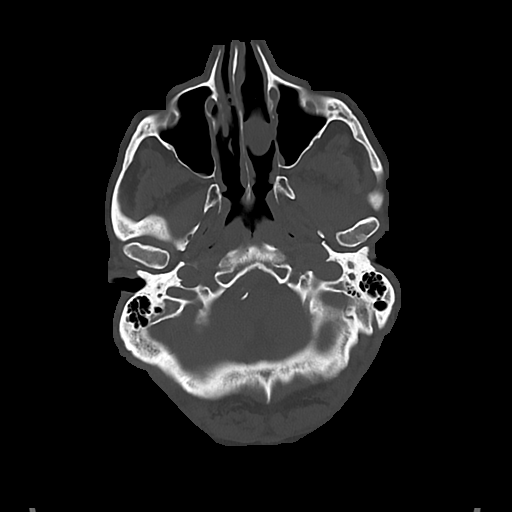
[im 17/81  bone]
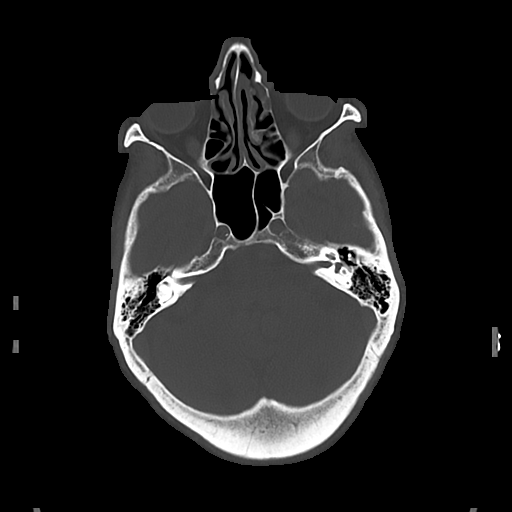

[15 of 47 positions shown; findings below may reference images not displayed]

FINDINGS: CT HEAD FINDINGS

Brain: No significant change in mild-to-moderate enlargement of the
ventricles and subarachnoid spaces. Mild-to-moderate patchy white
matter low density in both cerebral hemispheres without significant
change. No intracranial hemorrhage, mass lesion or CT evidence of
acute infarction.

Vascular: No hyperdense vessel or unexpected calcification.

Skull: Normal. Negative for fracture or focal lesion.

Sinuses/Orbits: Unremarkable orbits. Mild retained secretions in
both maxillary sinuses.

Other: Deviation of the mid to anterior portion of the nasal septum
to the right.

CT CERVICAL SPINE FINDINGS

Alignment: Normal.

Skull base and vertebrae: Interval oblique fracture through the base
of the dens with mild anterior distraction and mild posterior
angulation of the proximal fragment.

Soft tissues and spinal canal: No prevertebral fluid or swelling. No
visible canal hematoma.

Disc levels: Multilevel degenerative changes. Solid vertebral body
fusion at the C7-T1 level. Multilevel degenerative changes. Changes
of DISH anteriorly.

Upper chest: Mild bilateral centrilobular bullous changes.

Other: Dense bilateral carotid artery calcifications.
IMPRESSION: 1. Type 2 dens fracture with mild posterior angulation of the
proximal fragment.
2. No skull fracture or intracranial hemorrhage.
3. Mild-to-moderate cerebral and cerebellar atrophy and chronic
small vessel white matter ischemic changes.
4. Cervical spine degenerative changes, changes of DISH and solid
bone fusion involving the C7 and T1 vertebral bodies.

Critical Value/emergent results were called by telephone at the time
of interpretation on [DATE] at [DATE] to provider COREREGA,
who verbally acknowledged these results.

## 2022-01-18 IMAGING — CR DG HAND COMPLETE 3+V*R*
3 series · 3 of 3 positions shown · non-contrast
Comparison: None.

CLINICAL DATA: Fall.

EXAM:
RIGHT HAND - COMPLETE 3+ VIEW

[hand pa]
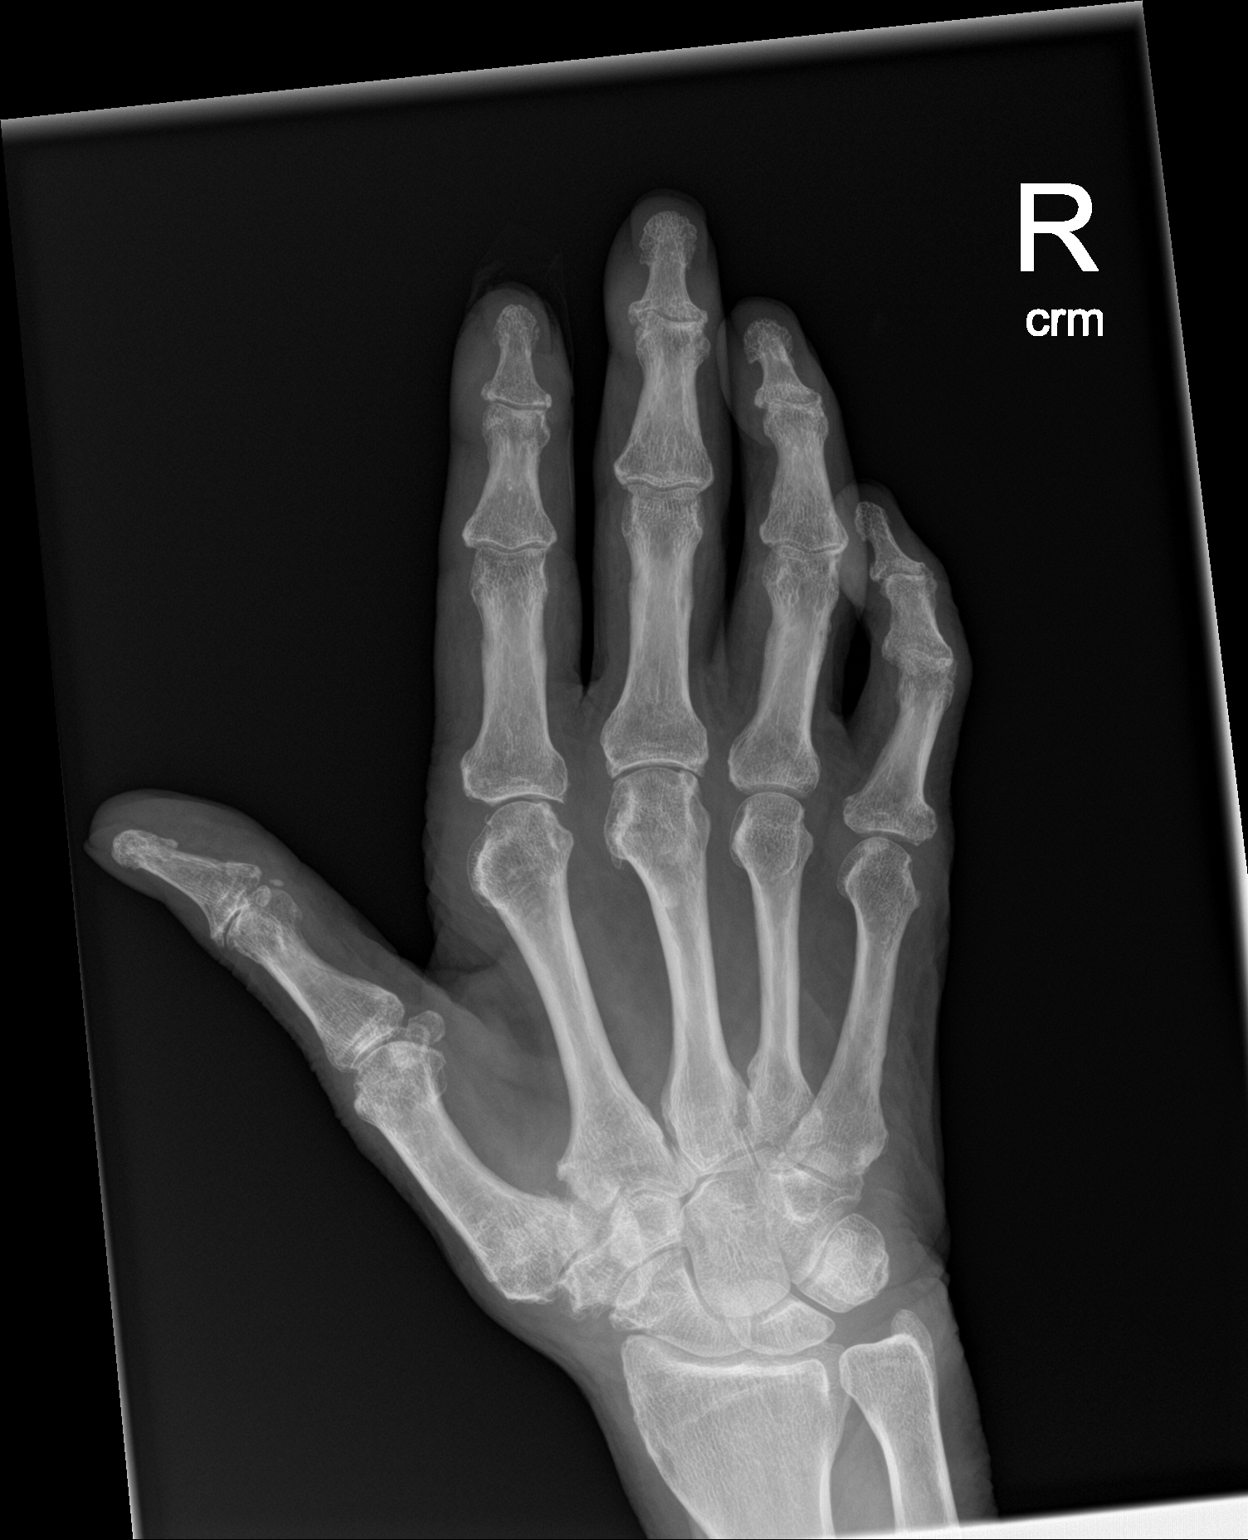

[hand obl]
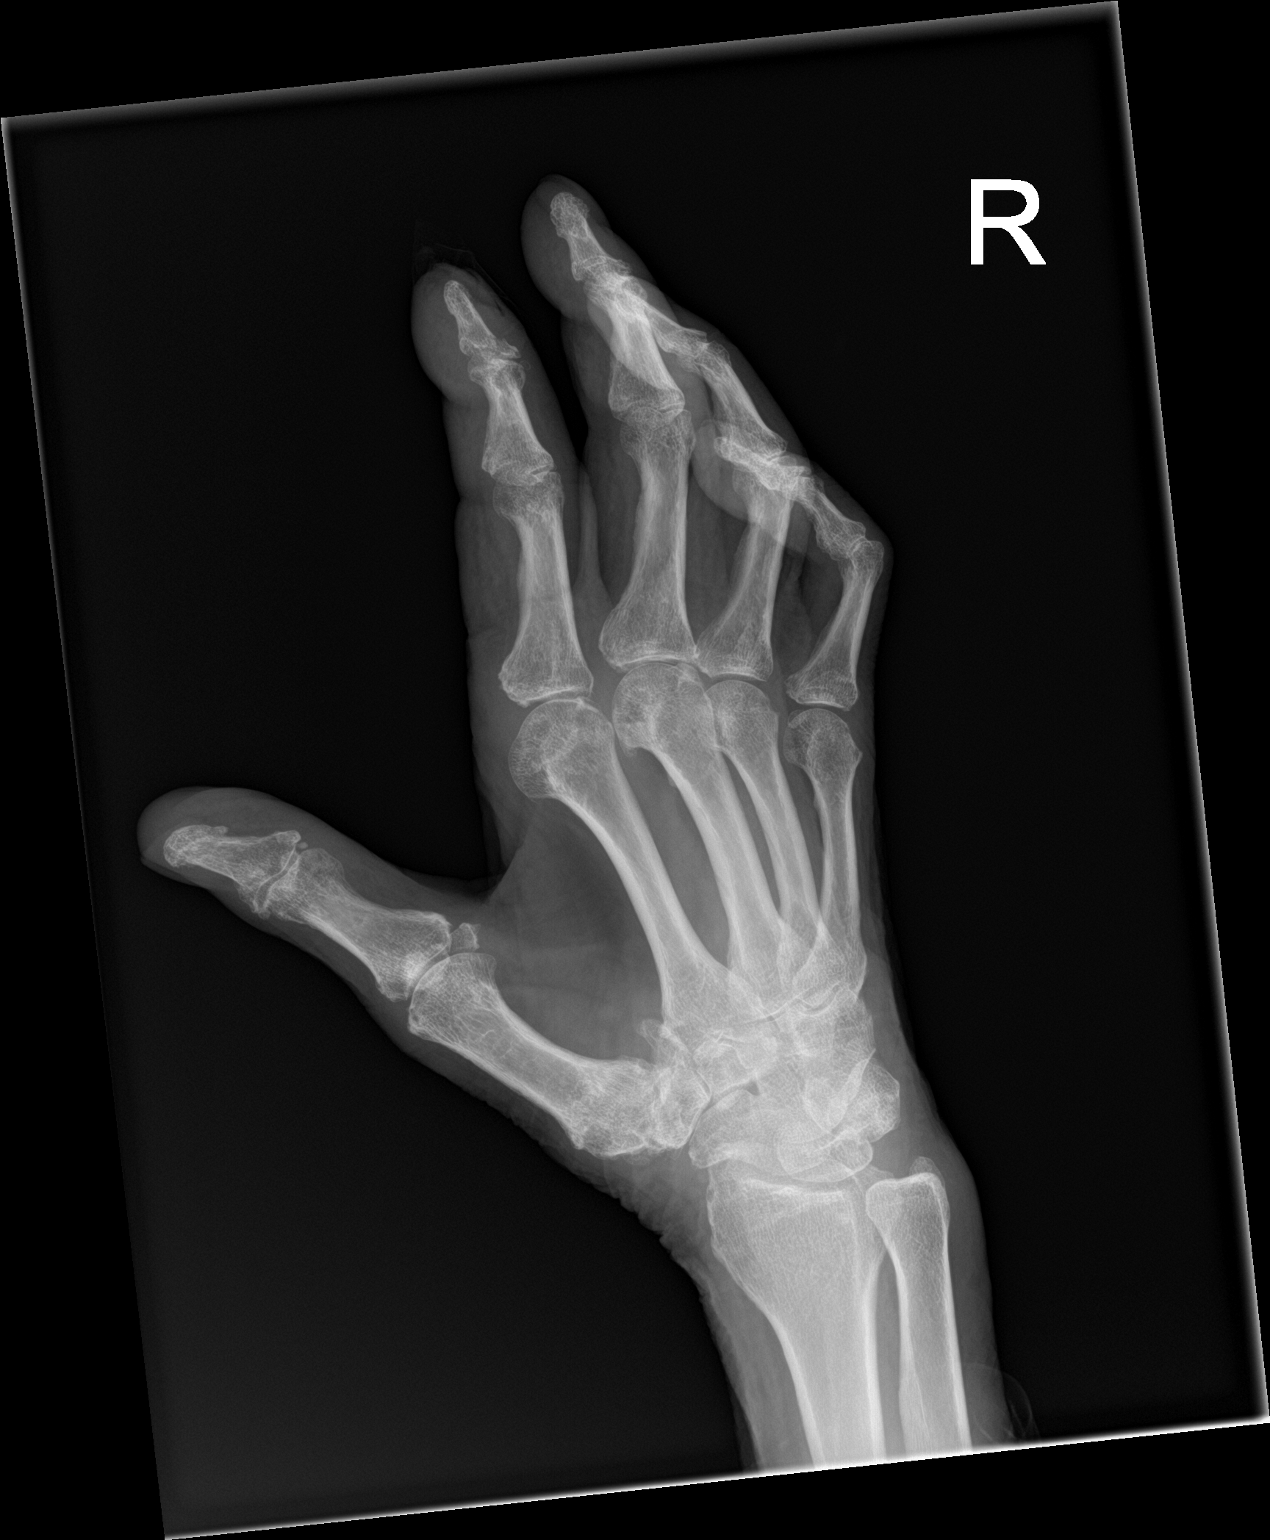

[hand lat]
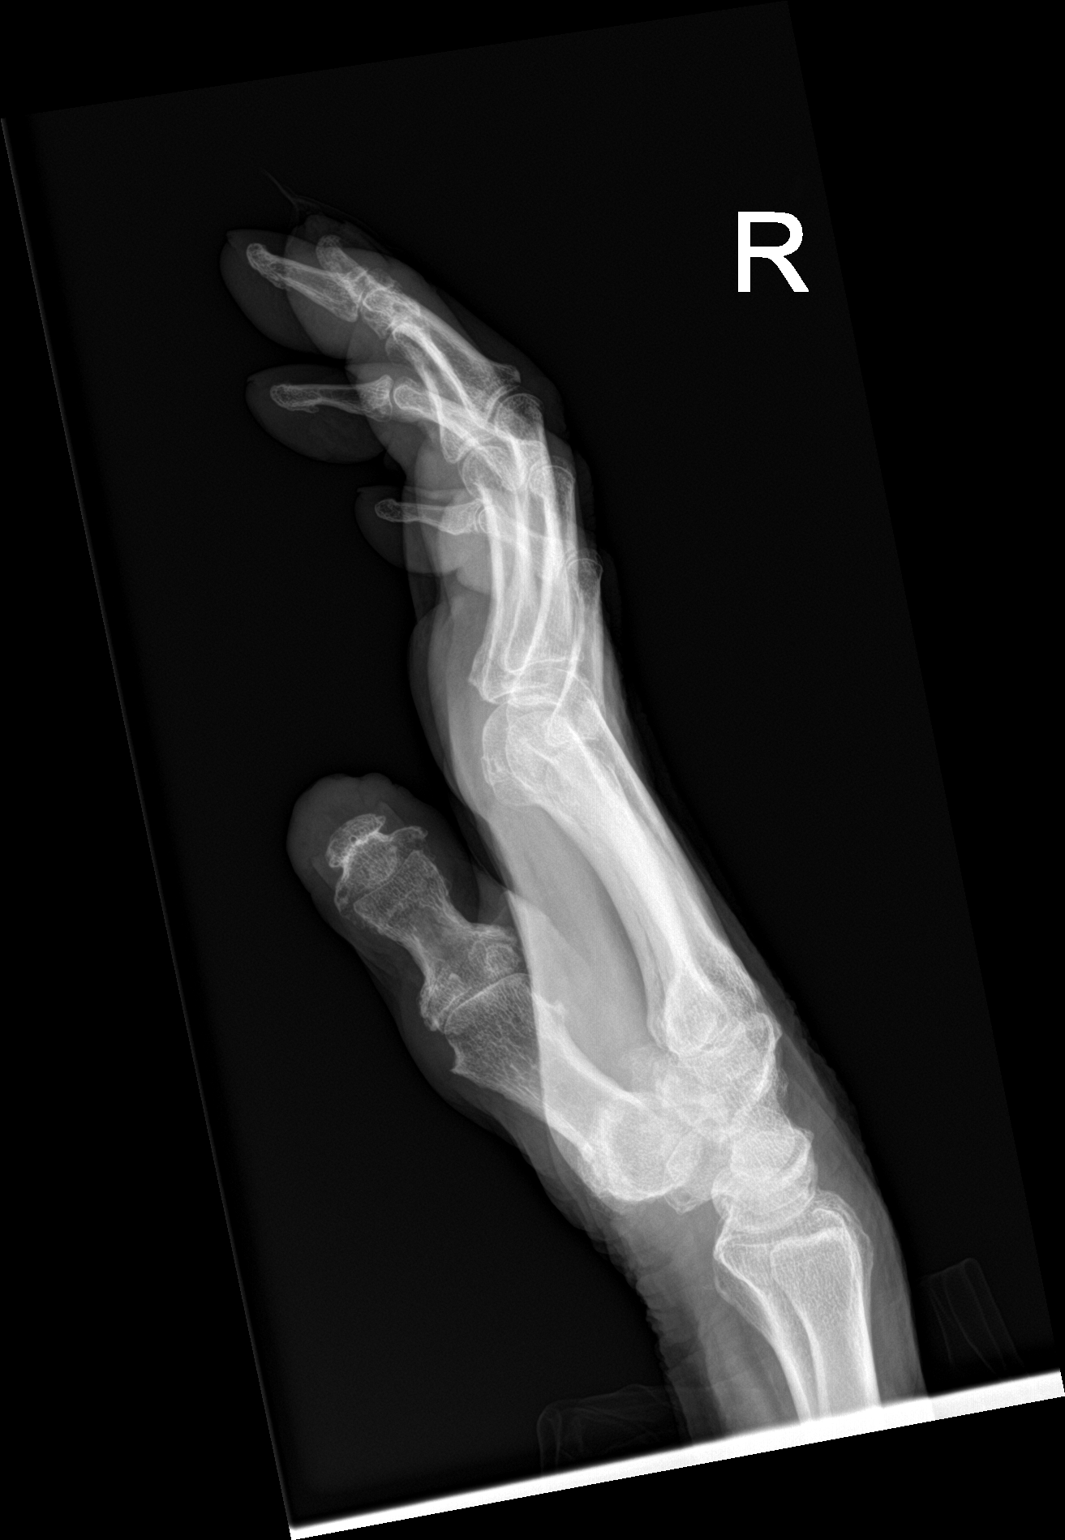

[3 of 3 positions shown; findings below may reference images not displayed]

FINDINGS: There is no evidence of fracture or dislocation. There are mild
degenerative changes at the first carpometacarpal joint and
throughout the interphalangeal joints. Soft tissues are
unremarkable.
IMPRESSION: 1. No acute bony abnormality.
2. Mild degenerative changes of the.

## 2022-01-18 IMAGING — DX DG FINGER INDEX 2+V*R*
3 series · 3 of 3 positions shown · non-contrast
Comparison: [DATE]

CLINICAL DATA: Fell yesterday, pain

EXAM:
RIGHT INDEX FINGER 2+V

[finger ap]
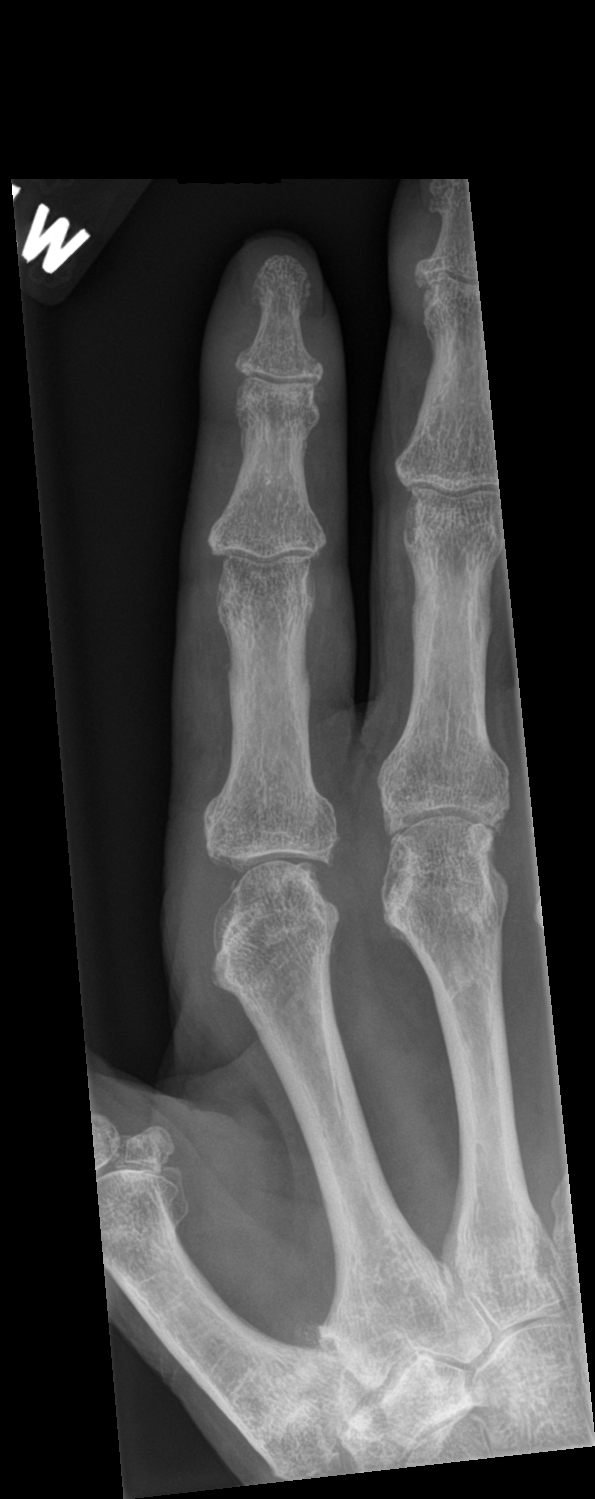

[finger obl]
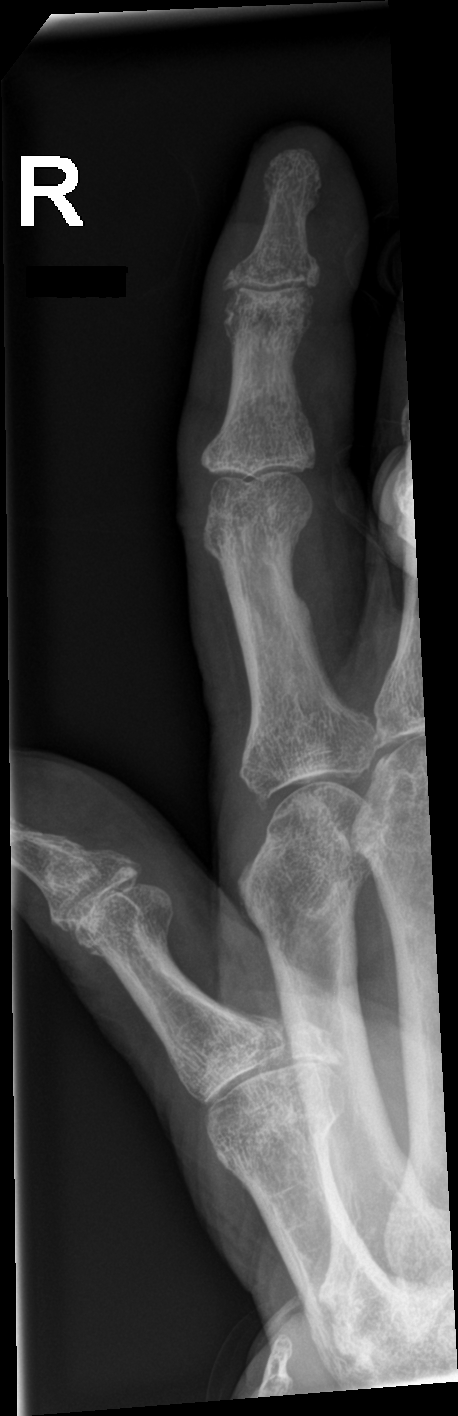

[finger lat]
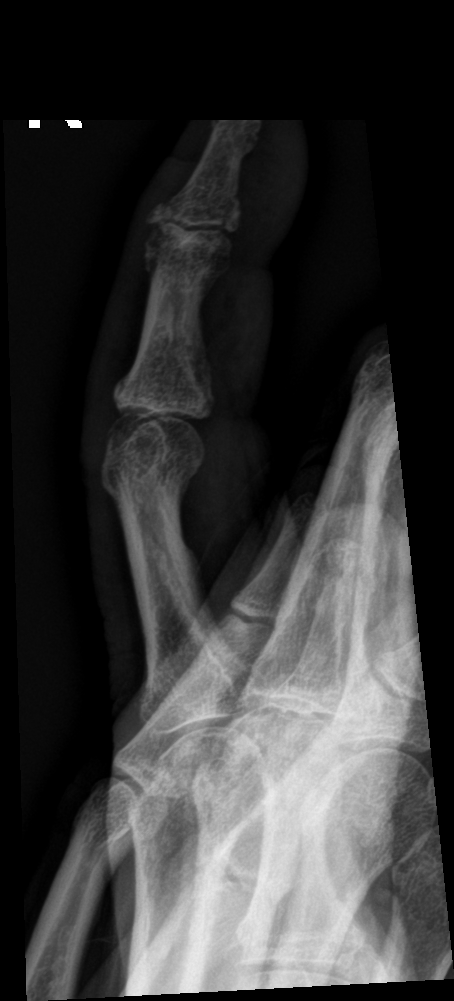

[3 of 3 positions shown; findings below may reference images not displayed]

FINDINGS: Frontal, oblique, lateral views of the right second digit are
obtained. There is a minimally displaced intra-articular fracture
involving the ulnar aspect of the base of the first distal phalanx.
Marginal osteophyte is seen along the medial aspect of the first
distal interphalangeal joint. There is distal soft tissue swelling.
IMPRESSION: 1. Minimally displaced intra-articular fracture ulnar aspect base of
the first distal phalanx.
2. Soft tissue swelling.
3. Osteoarthritis.

## 2022-01-18 IMAGING — CT CT CERVICAL SPINE W/O CM
2 series · 10 of 27 positions shown, 13 images · non-contrast
Comparison: Cervical spine MRI dated [DATE]. Head CT dated
[DATE].

CLINICAL DATA: Fell yesterday.



[Series 5: c spine soft · axial · 0.39mm/px · z∈[+994,+1116]mm · 5 of 89 slices shown, 7 images]
[im 14/89  soft-tissue]
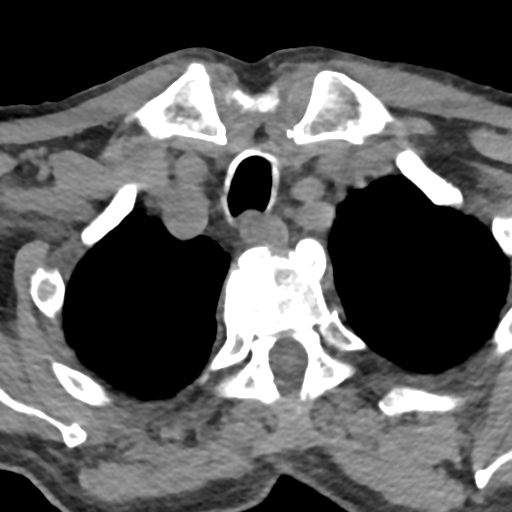
[im 14/89  bone]
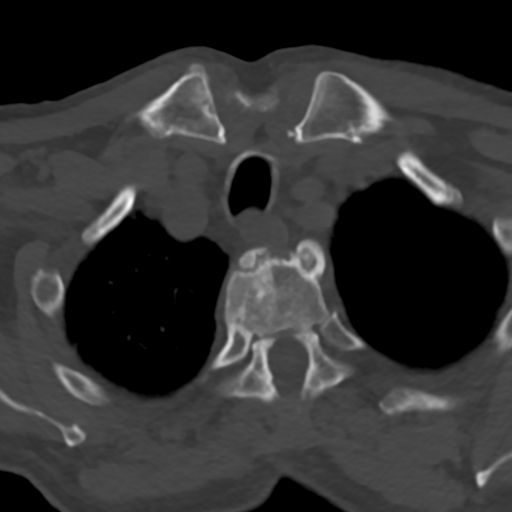
[im 28/89  bone]
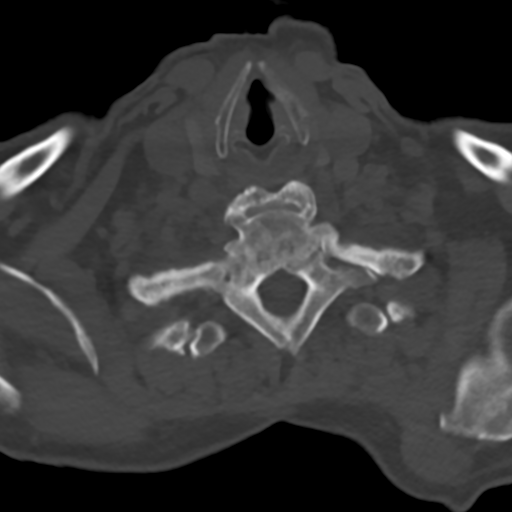
[im 48/89  bone]
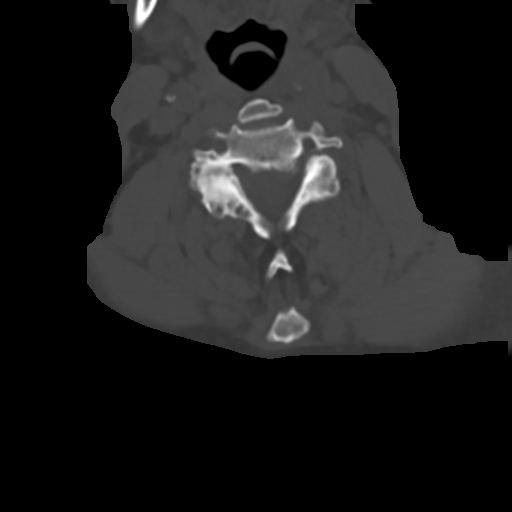
[im 61/89  bone]
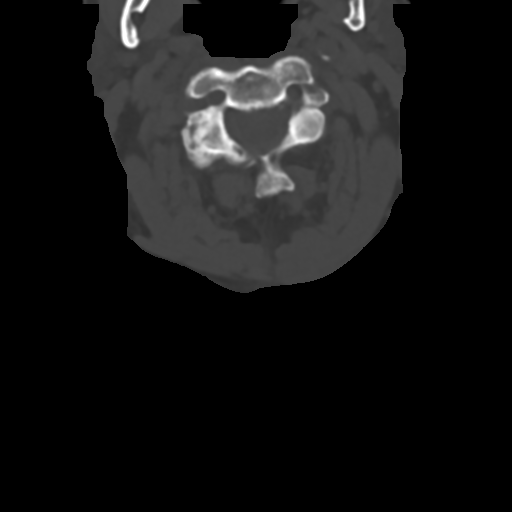
[im 75/89  soft-tissue]
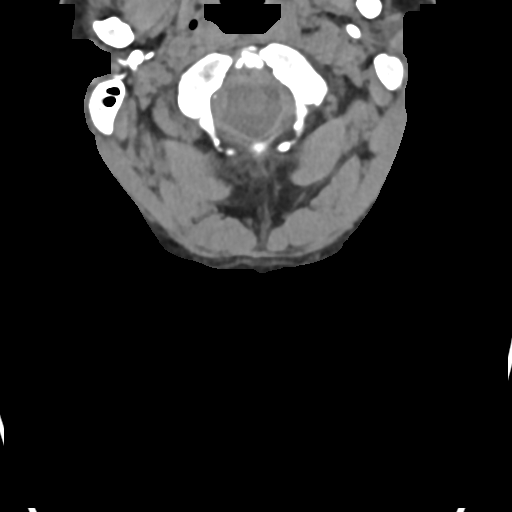
[im 75/89  bone]
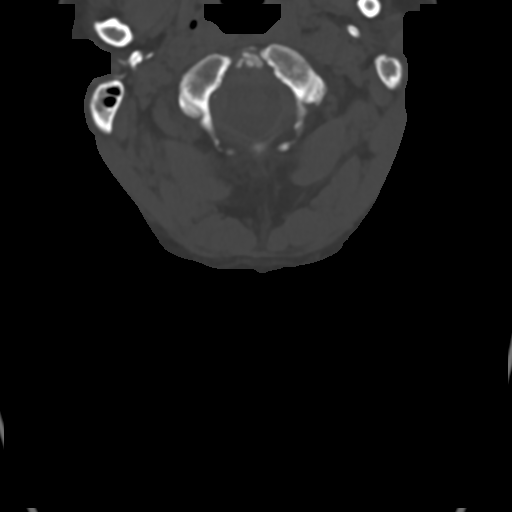

[Series 9: sag bone · sagittal · 0.23mm/px · 5 of 66 slices shown, 6 images]
[im 22/66  bone]
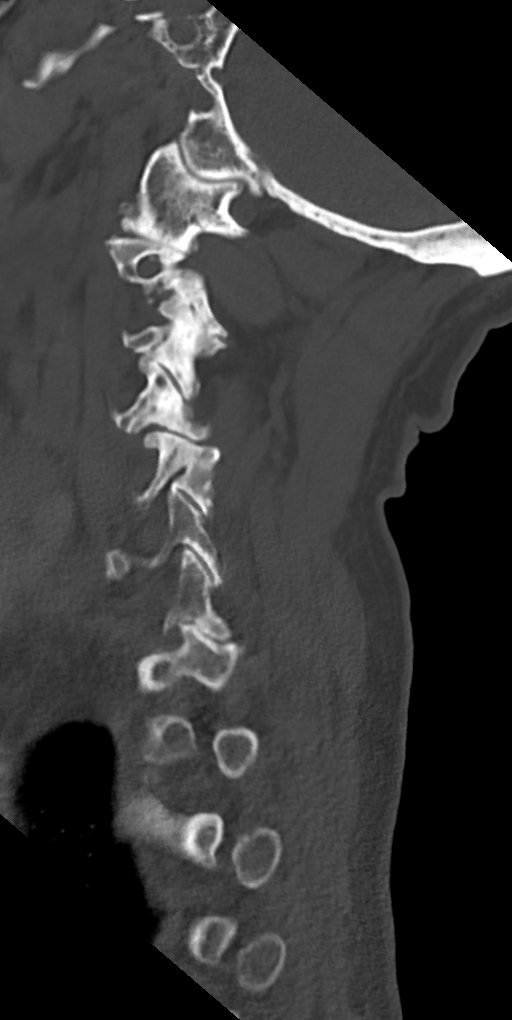
[im 28/66  bone]
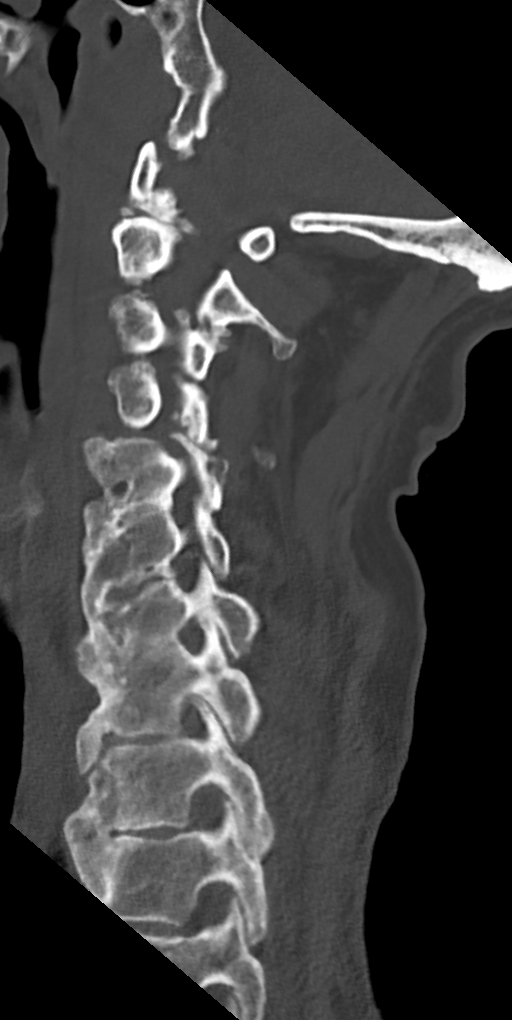
[im 33/66  soft-tissue]
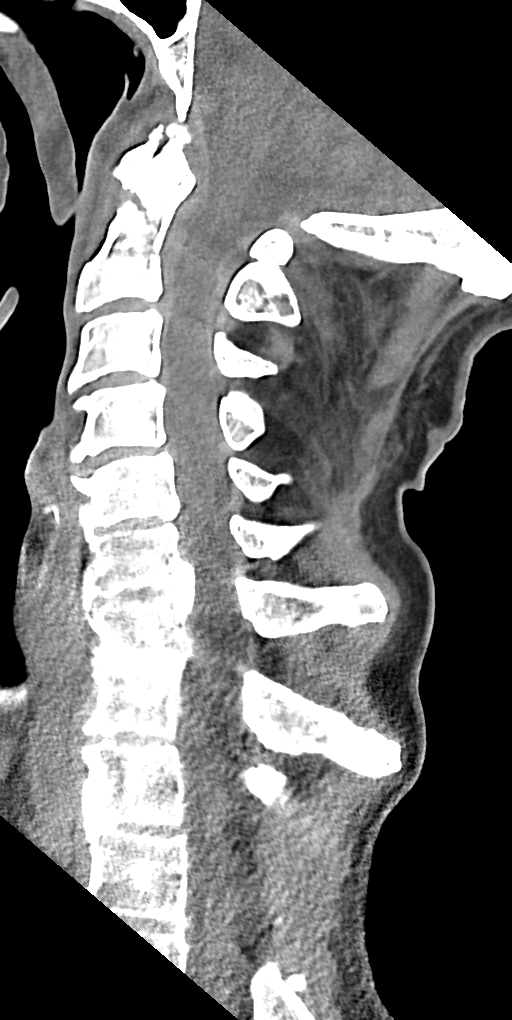
[im 33/66  bone]
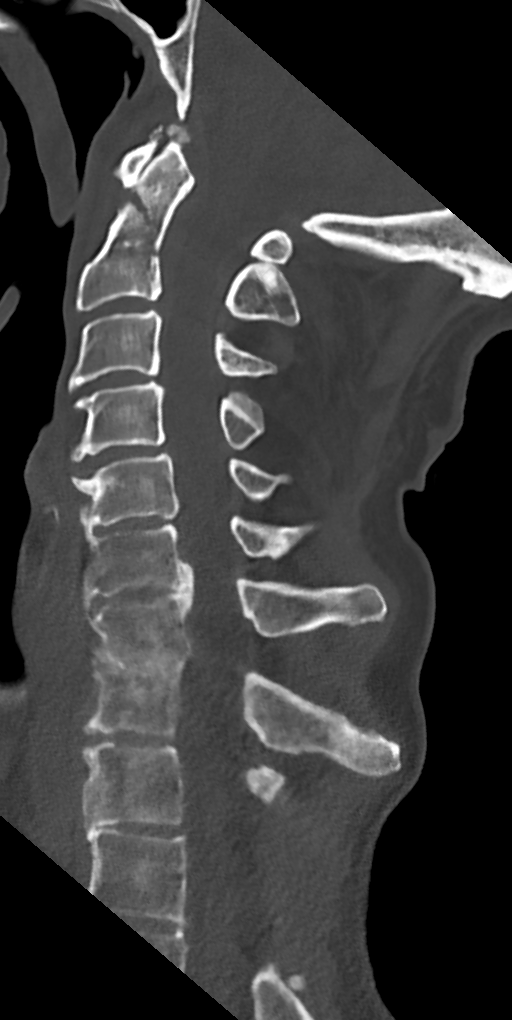
[im 38/66  bone]
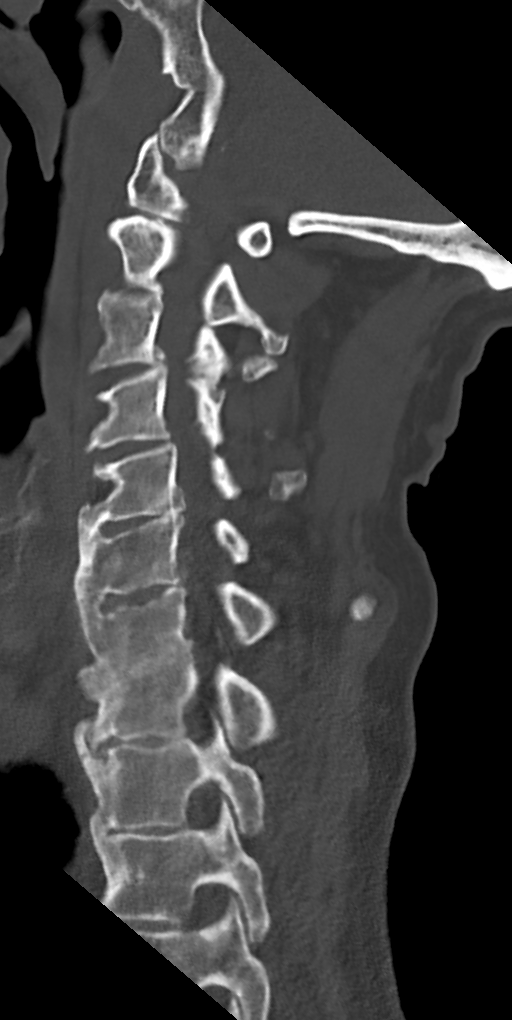
[im 44/66  bone]
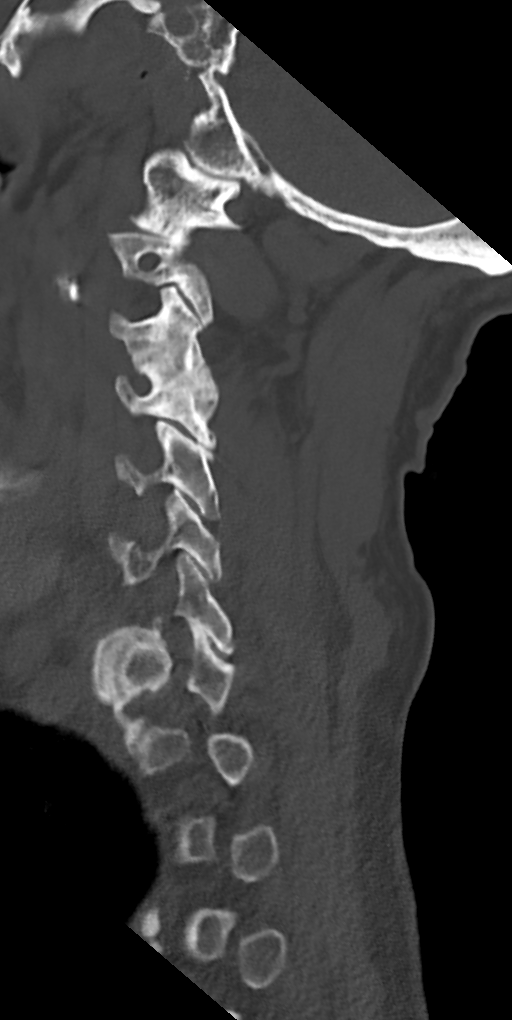

[10 of 27 positions shown; findings below may reference images not displayed]

FINDINGS: CT HEAD FINDINGS

Brain: No significant change in mild-to-moderate enlargement of the
ventricles and subarachnoid spaces. Mild-to-moderate patchy white
matter low density in both cerebral hemispheres without significant
change. No intracranial hemorrhage, mass lesion or CT evidence of
acute infarction.

Vascular: No hyperdense vessel or unexpected calcification.

Skull: Normal. Negative for fracture or focal lesion.

Sinuses/Orbits: Unremarkable orbits. Mild retained secretions in
both maxillary sinuses.

Other: Deviation of the mid to anterior portion of the nasal septum
to the right.

CT CERVICAL SPINE FINDINGS

Alignment: Normal.

Skull base and vertebrae: Interval oblique fracture through the base
of the dens with mild anterior distraction and mild posterior
angulation of the proximal fragment.

Soft tissues and spinal canal: No prevertebral fluid or swelling. No
visible canal hematoma.

Disc levels: Multilevel degenerative changes. Solid vertebral body
fusion at the C7-T1 level. Multilevel degenerative changes. Changes
of DISH anteriorly.

Upper chest: Mild bilateral centrilobular bullous changes.

Other: Dense bilateral carotid artery calcifications.
IMPRESSION: 1. Type 2 dens fracture with mild posterior angulation of the
proximal fragment.
2. No skull fracture or intracranial hemorrhage.
3. Mild-to-moderate cerebral and cerebellar atrophy and chronic
small vessel white matter ischemic changes.
4. Cervical spine degenerative changes, changes of DISH and solid
bone fusion involving the C7 and T1 vertebral bodies.

Critical Value/emergent results were called by telephone at the time
of interpretation on [DATE] at [DATE] to provider COREREGA,
who verbally acknowledged these results.

## 2022-01-18 MED ORDER — SODIUM CHLORIDE 0.9 % IV SOLN
100.0000 mg | Freq: Once | INTRAVENOUS | Status: AC
  Administered 2022-01-18: 100 mg via INTRAVENOUS
  Filled 2022-01-18: qty 100

## 2022-01-18 MED ORDER — FENTANYL CITRATE PF 50 MCG/ML IJ SOSY
100.0000 ug | PREFILLED_SYRINGE | Freq: Once | INTRAMUSCULAR | Status: AC
Start: 2022-01-18 — End: 2022-01-18
  Administered 2022-01-18: 100 ug via INTRAVENOUS
  Filled 2022-01-18: qty 2

## 2022-01-18 NOTE — ED Provider Notes (Signed)
[facility] EMERGENCY DEPARTMENT Provider Note   CSN: [id number] Arrival date & time: 01/18/22  1813     History  Chief Complaint  Patient presents with   Kent Flowers is a 76 y.o. male.  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 Venetia Constable performed toracic lalminectomy for epidural abscess,  hx bile duct stone with extraction via ERCP at [facility], recent admission to [facility] for fall with right hip fracture and surgery, just discharged from [facility] rehab 10 days ago, who presents with concern for fall, neck pain, generalized weakness, leg pain, redness and swelling.  Daughter reports that they were outside for about 30 minutes and came back inside and found him tryingto get up after a fall. Reports she is not sure if had syncopal episode, however he was able to give detailed history of events and she agrees that he likely had a mechanical fall.  He reports he lost his balance and grazed right side of his head as he fell. No LOC.  Did not have chest pain, shortness of breath, palpitations, nausea, vomiting, diarrhea, black or bloody stools.   Fall occurred yesterday.  Has severe neck pain. Also reports index finger pain. Daughter had a difficult time getting bleeding o stop yesterday but was able to with pressure, notes he had a lot of pain.  Denies any new numbness, weakness.  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  He has had some swelling to the right lower extremity since the surgery, however has had increased redness and pain over the last couple of days, and they also note some redness and swelling to the left lower extremity which is new.  The home health nurse had recommended that they be evaluated for this concern for cellulitis or possible DVT.  No fever, vomiting.  He has had history of sepsis MRSA in the past and been very sick. Daughter reports he has had generalized weakness since the fall.   Of note, he and his wife both fell in December within 10 minutes of eachother, she unfortunately suffered a head bleed and died while he was also in the hospital having his hip repaired. He has been on chronic narcotics for a long period of time.     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 young (pulmologist)   LLL and LUL-- per last CT 10-01-2013  stable and previous right lung nodule not seen   Thoracic ascending aortic aneurysm    STABLE PER LAST CT 10-01-2013  4CM--  ECTATIC    Urethral stricture    Urgency of urination    Urinary  incontinence 03/24/2020   Vertebral osteomyelitis ([place]) 02/04/2020   Wears glasses    Past Surgical History:  Procedure Laterality Date   CYSTOSCOPY WITH BIOPSY N/A 04/10/2017   Procedure: POSSIBLE BLADDER  BIOPSY;  Surgeon: Festus Aloe, MD;  Location: [facility];  Service: Urology;  Laterality: N/A;   CYSTOSCOPY WITH URETHRAL DILATATION N/A 04/10/2017   Procedure: CYSTOSCOPY WITH BALLOON URETHRALSTRICTURE DILATATION;  Surgeon: Festus Aloe, MD;  Location: [facility];  Service: Urology;  Laterality: N/A;   PENILE BIOPSY N/A 03/14/2013   Procedure: PENILE BIOPSY;  Surgeon: Fredricka Bonine, MD;  Location: [facility];  Service: Urology;  Laterality: N/A;   REATTACHMENT LEFT INDEX FINGER  1988   SHOULDER ARTHROSCOPY WITH ROTATOR CUFF REPAIR AND SUBACROMIAL DECOMPRESSION Left 2004   THORACIC LAMINECTOMY FOR EPIDURAL ABSCESS N/A 12/18/2019   Procedure: THORACIC LAMINECTOMY FOR EPIDURAL ABSCESS Thoracic Two, Thoracic Three;  Surgeon: [name], MD;  Location: [place];  Service: Neurosurgery;  Laterality: N/A;  THORACIC LAMINECTOMY FOR EPIDURAL ABSCESS Thoracic Two, Thoracic Three   TONSILLECTOMY  AS CHILD    Home Medications Prior to Admission medications   Medication Sig Start Date End Date Taking? Authorizing Provider  albuterol (PROAIR HFA) 108 (90 Base) MCG/ACT inhaler INHALE 2 PUFFS INTO THE LUNGS EVERY 6 HOURS AS NEEDED FOR WHEEZING ORSHORTNESS OF BREATH Patient taking differently: Inhale 2 puffs into the lungs every 6 (six) hours as needed for shortness of breath or wheezing. 09/27/21  Yes Young, Tarri Fuller D, MD  gabapentin (NEURONTIN) 300 MG capsule Take 1 capsule (300 mg total) by mouth 3 (three) times daily. Patient taking differently: Take 600 mg by mouth 3 (three) times daily. 12/25/19  Yes Earlene Plater, MD  oxyCODONE (ROXICODONE) 15 MG immediate release tablet Take 15 mg by mouth 5 (five) times daily as needed for  pain. 01/06/22  Yes [provider]  testosterone cypionate (DEPOTESTOSTERONE CYPIONATE) 200 MG/ML injection Inject 180 mg into the muscle every 14 (fourteen) days.  09/17/19  Yes [provider]  fluticasone-salmeterol (ADVAIR) 250-50 MCG/ACT AEPB Inhale 1 puff into the lungs every 12 (twelve) hours. Patient not taking: Reported on 01/18/2022 09/27/21   Baird Lyons D, MD  oxyCODONE 10 MG TABS Take 1 tablet (10 mg total) by mouth every 4 (four) hours while awake. Patient not taking: Reported on 01/18/2022 12/25/19   Earlene Plater, MD      Allergies    Ceclor [cefaclor] and Sulfa antibiotics    Review of Systems   Review of Systems  See above  Physical Exam Updated Vital Signs BP 122/71    Pulse 89    Temp 98 F (36.7 C) (Oral)    Resp 14    SpO2 95%  Physical Exam Vitals and nursing note reviewed.  Constitutional:      General: He is not in acute distress.    Appearance: He is well-developed. He is not diaphoretic.  HENT:  Head: Normocephalic.     Comments: Abrasion right face Eyes:     Conjunctiva/sclera: Conjunctivae normal.  Cardiovascular:     Rate and Rhythm: Normal rate and regular rhythm.  Pulmonary:     Effort: Pulmonary effort is normal. No respiratory distress.  Abdominal:     General: There is no distension.     Palpations: Abdomen is soft.     Tenderness: There is no abdominal tenderness. There is no guarding.  Musculoskeletal:        General: Swelling and tenderness present.     Cervical back: Normal range of motion.     Comments: Contusion, tenderness ti right index finger, abrasion underlying tip of nail  Skin:    General: Skin is warm and dry.  Neurological:     Mental Status: He is alert and oriented to person, place, and time.     Comments: RUE weakness (at baseline) RLE limited by hip surgery, able to lift, plantar/dorsiflex Normal strength LUE and LLE, normal sensation     ED Results / Procedures / Treatments   Labs (all  labs ordered are listed, but only abnormal results are displayed) Labs Reviewed  CBC WITH DIFFERENTIAL/PLATELET - Abnormal; Notable for the following components:      Result Value   RBC 3.71 (*)    Hemoglobin 10.8 (*)    HCT 34.2 (*)    All other components within normal limits  COMPREHENSIVE METABOLIC PANEL - Abnormal; Notable for the following components:   Glucose, Bld 108 (*)    Creatinine, Ser 1.28 (*)    Calcium 8.5 (*)    Albumin 3.0 (*)    GFR, Estimated 59 (*)    All other components within normal limits  RESP PANEL BY RT-PCR (FLU A&B, COVID) ARPGX2  CULTURE, BLOOD (ROUTINE X 2)  CULTURE, BLOOD (ROUTINE X 2)  LACTIC ACID, PLASMA  LACTIC ACID, PLASMA  BRAIN NATRIURETIC PEPTIDE    EKG EKG Interpretation  Date/Time:  Wednesday January 18 2022 20:54:37 EST Ventricular Rate:  78 PR Interval:  156 QRS Duration: 101 QT Interval:  393 QTC Calculation: 448 R Axis:   -40 Text Interpretation: Sinus rhythm Left anterior fascicular block Borderline low voltage, extremity leads No significant change since last tracing Confirmed by Gareth Morgan [phone]) on 01/19/2022 1:27:43 AM  Radiology CT HEAD WO CONTRAST (5MM)  Result Date: 01/18/2022 CLINICAL DATA:  [place] yesterday. EXAM: CT HEAD WITHOUT CONTRAST CT CERVICAL SPINE WITHOUT CONTRAST TECHNIQUE: Multidetector CT imaging of the head and cervical spine was performed following the standard protocol without intravenous contrast. Multiplanar CT image reconstructions of the cervical spine were also generated. RADIATION DOSE REDUCTION: This exam was performed according to the departmental dose-optimization program which includes automated exposure control, adjustment of the mA and/or kV according to patient size and/or use of iterative reconstruction technique. COMPARISON:  Cervical spine MRI dated 09/16/2020. Head CT dated 12/18/2019. FINDINGS: CT HEAD FINDINGS Brain: No significant change in mild-to-moderate enlargement of the ventricles  and subarachnoid spaces. Mild-to-moderate patchy white matter low density in both cerebral hemispheres without significant change. No intracranial hemorrhage, mass lesion or CT evidence of acute infarction. Vascular: No hyperdense vessel or unexpected calcification. Skull: Normal. Negative for fracture or focal lesion. Sinuses/Orbits: Unremarkable orbits. Mild retained secretions in both maxillary sinuses. Other: Deviation of the mid to anterior portion of the nasal septum to the right. CT CERVICAL SPINE FINDINGS Alignment: Normal. Skull base and vertebrae: Interval oblique fracture through the base of the dens with mild anterior  distraction and mild posterior angulation of the proximal fragment. Soft tissues and spinal canal: No prevertebral fluid or swelling. No visible canal hematoma. Disc levels: Multilevel degenerative changes. Solid vertebral body fusion at the C7-T1 level. Multilevel degenerative changes. Changes of DISH anteriorly. Upper chest: Mild bilateral centrilobular bullous changes. Other: Dense bilateral carotid artery calcifications. IMPRESSION: 1. Type 2 dens fracture with mild posterior angulation of the proximal fragment. 2. No skull fracture or intracranial hemorrhage. 3. Mild-to-moderate cerebral and cerebellar atrophy and chronic small vessel white matter ischemic changes. 4. Cervical spine degenerative changes, changes of DISH and solid bone fusion involving the C7 and T1 vertebral bodies. Critical Value/emergent results were called by telephone at the time of interpretation on 01/18/2022 at 7:46 pm to provider Carlisle Cater, who verbally acknowledged these results. Electronically Signed   By: Claudie Revering M.D.   On: 01/18/2022 19:47   CT Cervical Spine Wo Contrast  Result Date: 01/18/2022 CLINICAL DATA:  [place] yesterday. EXAM: CT HEAD WITHOUT CONTRAST CT CERVICAL SPINE WITHOUT CONTRAST TECHNIQUE: Multidetector CT imaging of the head and cervical spine was performed following the standard  protocol without intravenous contrast. Multiplanar CT image reconstructions of the cervical spine were also generated. RADIATION DOSE REDUCTION: This exam was performed according to the departmental dose-optimization program which includes automated exposure control, adjustment of the mA and/or kV according to patient size and/or use of iterative reconstruction technique. COMPARISON:  Cervical spine MRI dated 09/16/2020. Head CT dated 12/18/2019. FINDINGS: CT HEAD FINDINGS Brain: No significant change in mild-to-moderate enlargement of the ventricles and subarachnoid spaces. Mild-to-moderate patchy white matter low density in both cerebral hemispheres without significant change. No intracranial hemorrhage, mass lesion or CT evidence of acute infarction. Vascular: No hyperdense vessel or unexpected calcification. Skull: Normal. Negative for fracture or focal lesion. Sinuses/Orbits: Unremarkable orbits. Mild retained secretions in both maxillary sinuses. Other: Deviation of the mid to anterior portion of the nasal septum to the right. CT CERVICAL SPINE FINDINGS Alignment: Normal. Skull base and vertebrae: Interval oblique fracture through the base of the dens with mild anterior distraction and mild posterior angulation of the proximal fragment. Soft tissues and spinal canal: No prevertebral fluid or swelling. No visible canal hematoma. Disc levels: Multilevel degenerative changes. Solid vertebral body fusion at the C7-T1 level. Multilevel degenerative changes. Changes of DISH anteriorly. Upper chest: Mild bilateral centrilobular bullous changes. Other: Dense bilateral carotid artery calcifications. IMPRESSION: 1. Type 2 dens fracture with mild posterior angulation of the proximal fragment. 2. No skull fracture or intracranial hemorrhage. 3. Mild-to-moderate cerebral and cerebellar atrophy and chronic small vessel white matter ischemic changes. 4. Cervical spine degenerative changes, changes of DISH and solid bone  fusion involving the C7 and T1 vertebral bodies. Critical Value/emergent results were called by telephone at the time of interpretation on 01/18/2022 at 7:46 pm to provider Carlisle Cater, who verbally acknowledged these results. Electronically Signed   By: Claudie Revering M.D.   On: 01/18/2022 19:47   DG Chest Portable 1 View  Result Date: 01/18/2022 CLINICAL DATA:  [place] yesterday EXAM: PORTABLE CHEST 1 VIEW COMPARISON:  09/27/2021 FINDINGS: Single frontal view of the chest demonstrates an unremarkable cardiac silhouette allowing for portable AP technique. There is mild central vascular congestion without airspace disease, effusion, or pneumothorax. No acute bony abnormalities. IMPRESSION: 1. Mild central vascular congestion.  No acute airspace disease. Electronically Signed   By: Randa Ngo M.D.   On: 01/18/2022 21:19   DG Hand Complete Right  Result Date: 01/18/2022 CLINICAL DATA:  Fall. EXAM: RIGHT HAND - COMPLETE 3+ VIEW COMPARISON:  None. FINDINGS: There is no evidence of fracture or dislocation. There are mild degenerative changes at the first carpometacarpal joint and throughout the interphalangeal joints. Soft tissues are unremarkable. IMPRESSION: 1. No acute bony abnormality. 2. Mild degenerative changes of the. Electronically Signed   By: Ronney Asters M.D.   On: 01/18/2022 19:18   DG Finger Index Right  Result Date: 01/18/2022 CLINICAL DATA:  [place] yesterday, pain EXAM: RIGHT INDEX FINGER 2+V COMPARISON:  01/18/2022 FINDINGS: Frontal, oblique, lateral views of the right second digit are obtained. There is a minimally displaced intra-articular fracture involving the ulnar aspect of the base of the first distal phalanx. Marginal osteophyte is seen along the medial aspect of the first distal interphalangeal joint. There is distal soft tissue swelling. IMPRESSION: 1. Minimally displaced intra-articular fracture ulnar aspect base of the first distal phalanx. 2. Soft tissue swelling. 3. Osteoarthritis.  Electronically Signed   By: Randa Ngo M.D.   On: 01/18/2022 21:21    Procedures Procedures    Medications Ordered in ED Medications  doxycycline (VIBRAMYCIN) 100 mg in sodium chloride 0.9 % 250 mL IVPB (100 mg Intravenous New Bag/Given 01/18/22 2352)  fentaNYL (SUBLIMAZE) injection 100 mcg (100 mcg Intravenous Given 01/18/22 2121)    ED Course/ Medical Decision Making/ A&P                           Medical Decision Making Amount and/or Complexity of Data Reviewed Labs: ordered. Radiology: ordered. ECG/medicine tests: ordered.  Risk Prescription drug management.   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 Venetia Constable performed toracic lalminectomy for epidural abscess,  hx bile duct stone with extraction via ERCP at [facility], recent admission to [facility] for fall with right hip fracture and surgery, just discharged from [facility] rehab 10 days ago, who presents with concern for fall, neck pain, generalized weakness, leg pain, redness and swelling.  Regarding fall-- CT head and CT Cervical spine reviewed and interpreted by me and radiology shows type 2 Dens fracture.  He is neurologically at baseline (has baseline RUE weakness, RLE with limitation from recent hip surgery) Consulted [facility], spoke with NP Meyran who reviewed the images and recommends c collar at all times and follow up with Dr. Venetia Constable (he has seen him in the past.)   XR personally reviewed and interpreted by me of finger shows distal phalanx fracture. Finger splint ordered. Had bleeding/crush of finger but no sign of significant laceration  for repair, feel ppx abx appropriate. Stable for outpatient hand follow up, note he has poor function of this hand at baseline.  Regarding erythema/swelling legs left greater than right:  Recent hip surgery puts him high risk for DVT, unable to perform vascular study at this time but ordered for AM. Has some swelling bilaterally and bilateral study ordered.    Has new erythema right greater than left--concerning for cellulitis vs DVT. Does not show signs of sepsis at this time, but erythema involves large area, has history of prior sepsis/osteomyelitis, has generalized weakness and poor ability to return for DVT study tomorrow and given significant cellulitis or DVT also with generalized weakness, fall (with injury) feel  observation admission appopriate.  Ordered lactic acid, blood cx.  Gave doxycycline earlier in ED stay-at this time do not see indication to broaden.  Admitted for further care.        Final Clinical Impression(s) / ED Diagnoses Final diagnoses:  Fall, initial encounter  Closed odontoid fracture, initial encounter ([place])  Cellulitis of lower extremity, unspecified laterality    Rx / DC Orders ED Discharge Orders     None         Gareth Morgan, MD 01/19/22 0128

## 2022-01-18 NOTE — ED Triage Notes (Signed)
Patient BIB [facility] from home, compliant of a mechanical fall yesterday. States he was reaching for something and fell. Woke up this morning with neck pain. Refused c-collar from EMS. 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 VSS. NAD.

## 2022-01-18 NOTE — ED Provider Triage Note (Signed)
Emergency Medicine Provider Triage Evaluation Note  Kent Flowers , a 76 y.o. male  was evaluated in triage.  Pt complains of neck pain, head injury, right hand injury after fall yesterday.  He has right-sided weakness at baseline.  Currently complains of right-sided neck pain.  He also sustained abrasion on his right forehead from the fall.  In addition, abrasions to his left hand that are bandaged.  Review of Systems  Positive: Neck pain Negative: Vomiting  Physical Exam  BP (!) 111/55    Pulse 83    Temp 98 F (36.7 C) (Oral)    Resp 18    SpO2 93%  Gen:   Awake, no distress   Resp:  Normal effort  MSK:   Moves extremities without difficulty  Other:  Abrasion to right frontal forehead, tenderness to the right trapezius/lateral neck, abrasion and bandaging to right index finger  Medical Decision Making  Medically screening exam initiated at 6:25 PM.  Appropriate orders placed.  Kent Flowers was informed that the remainder of the evaluation will be completed by another provider, this initial triage assessment does not replace that evaluation, and the importance of remaining in the ED until their evaluation is complete.     Kent Cater, PA-C 01/18/22 2035

## 2022-01-18 NOTE — ED Notes (Signed)
[place] collar applied 

## 2022-01-19 ENCOUNTER — Observation Stay (HOSPITAL_BASED_OUTPATIENT_CLINIC_OR_DEPARTMENT_OTHER): Payer: Medicare Other

## 2022-01-19 DIAGNOSIS — M7989 Other specified soft tissue disorders: Secondary | ICD-10-CM | POA: Diagnosis not present

## 2022-01-19 DIAGNOSIS — S12110A Anterior displaced Type II dens fracture, initial encounter for closed fracture: Secondary | ICD-10-CM | POA: Diagnosis present

## 2022-01-19 DIAGNOSIS — S62639A Displaced fracture of distal phalanx of unspecified finger, initial encounter for closed fracture: Secondary | ICD-10-CM | POA: Diagnosis present

## 2022-01-19 DIAGNOSIS — L538 Other specified erythematous conditions: Secondary | ICD-10-CM | POA: Diagnosis not present

## 2022-01-19 DIAGNOSIS — S12100A Unspecified displaced fracture of second cervical vertebra, initial encounter for closed fracture: Secondary | ICD-10-CM | POA: Diagnosis not present

## 2022-01-19 DIAGNOSIS — J189 Pneumonia, unspecified organism: Secondary | ICD-10-CM

## 2022-01-19 DIAGNOSIS — R0602 Shortness of breath: Secondary | ICD-10-CM

## 2022-01-19 DIAGNOSIS — R600 Localized edema: Secondary | ICD-10-CM | POA: Diagnosis present

## 2022-01-19 DIAGNOSIS — J449 Chronic obstructive pulmonary disease, unspecified: Secondary | ICD-10-CM

## 2022-01-19 LAB — LACTIC ACID, PLASMA
Lactic Acid, Venous: 0.7 mmol/L (ref 0.5–1.9)
Lactic Acid, Venous: 0.6 mmol/L (ref 0.5–1.9)

## 2022-01-19 LAB — BRAIN NATRIURETIC PEPTIDE: B Natriuretic Peptide: 86.5 pg/mL (ref 0.0–100.0)

## 2022-01-19 MED ORDER — SODIUM CHLORIDE 0.9 % IV SOLN
100.0000 mg | Freq: Two times a day (BID) | INTRAVENOUS | Status: DC
  Administered 2022-01-19 (×2): 100 mg via INTRAVENOUS
  Filled 2022-01-19 (×4): qty 100

## 2022-01-19 MED ORDER — ALBUTEROL SULFATE (2.5 MG/3ML) 0.083% IN NEBU
3.0000 mL | INHALATION_SOLUTION | Freq: Four times a day (QID) | RESPIRATORY_TRACT | Status: DC | PRN
  Administered 2022-01-19: 3 mL via RESPIRATORY_TRACT
  Filled 2022-01-19: qty 3

## 2022-01-19 MED ORDER — ACETAMINOPHEN 325 MG PO TABS: 650.0000 mg | ORAL_TABLET | Freq: Four times a day (QID) | ORAL | Status: DC | PRN

## 2022-01-19 MED ORDER — MOMETASONE FURO-FORMOTEROL FUM 200-5 MCG/ACT IN AERO
2.0000 | INHALATION_SPRAY | Freq: Two times a day (BID) | RESPIRATORY_TRACT | Status: DC
  Administered 2022-01-20: 2 via RESPIRATORY_TRACT
  Filled 2022-01-19 (×2): qty 8.8

## 2022-01-19 MED ORDER — MORPHINE SULFATE (PF) 2 MG/ML IV SOLN
1.0000 mg | INTRAVENOUS | Status: DC | PRN
  Administered 2022-01-19 – 2022-01-20 (×7): 1 mg via INTRAVENOUS
  Filled 2022-01-19 (×7): qty 1

## 2022-01-19 MED ORDER — ACETAMINOPHEN 650 MG RE SUPP: 650.0000 mg | Freq: Four times a day (QID) | RECTAL | Status: DC | PRN

## 2022-01-19 NOTE — Assessment & Plan Note (Addendum)
-  Stable.  No signs of acute exacerbation.

## 2022-01-19 NOTE — Progress Notes (Signed)
Patient seen and examined this morning, admitted overnight, H&P reviewed and agree with assessment and plan.  76 year old male with history of COPD, right apical lung mass, penile squamous cell carcinoma, BPH, chronic low back pain, osteoarthritis, MSSA bacteremia in January 2021, discitis/osteomyelitis, came into the hospital with neck and finger pain after a fall at home.  Of note, he was recently hospitalized couple months ago for fall and hip fracture, and was discharged to an SNF.  He was home for the past 10 days but continued to be weak and had 2 falls, one of them resulting in his hospital presentation.  Lactic acid pending.  CT C-spine showing type II dens fracture with mild posterior angulation of the proximal fragment.  Neurosurgery consulted - recommended c-collar at all times and outpatient neurosurgery follow-up.  X-ray of right index finger showing minimally displaced intra-articular fracture involving the ulnar aspect of the base of the first distal phalanx.  Finger splint ordered per orthopedic surgery  Dens fracture-Per neurosurgery, c-collar and outpatient follow-up.  Continue pain control.  Lower extremity edema-lower extremity Dopplers negative for DVT.  Has been placed on doxycycline for early cellulitis, continue  Closed fracture distal phalanx-right index finger, EDP discussed with Dr. Freada Bergeron, splint and outpatient follow-up  COPD-stable, no wheezing  Tobacco use-cut down significantly but still smoking few cigarettes here and there.  EtOH use-used to drink more in the past but not anymore.  Monitor  Chronic kidney disease stage IIIa-Baseline creatinine 1.2-1.3, currently at baseline  Anemia of chronic disease-hemoglobin stable, no bleeding  History of vertebral osteomyelitis, epidural abscess-completed treatment  Recent hip fracture, persistent deconditioning-just was in rehab, but upon going home had recurrent falls.  Suspect he will need rehab again.   Scheduled  Meds:  mometasone-formoterol  2 puff Inhalation BID   Continuous Infusions:  doxycycline (VIBRAMYCIN) IV     PRN Meds:.acetaminophen **OR** acetaminophen, albuterol, morphine injection   Lorry Furber M. Cruzita Lederer, MD, PhD [facility]  Between 7 am - 7 pm you can contact me via Amion (for emergencies) or [place] (non urgent matters).  I am not available 7 pm - 7 am, please contact night coverage MD/APP via Amion

## 2022-01-19 NOTE — TOC CAGE-AID Note (Signed)
Transition of Care [facility]) - CAGE-AID Screening   Patient Details  Name: JAKEEM GRAPE MRN: [id number] Date of Birth: 06-Jul-1946  Transition of Care [facility]) CM/SW Contact:    Adonijah Baena C Tarpley-Carter, [place] Phone Number: 01/19/2022, 3:01 PM   Clinical Narrative: Pt participated in [place].  Pt stated he does not use substance or ETOH.  Pt was not offered resources, due to no usage of substance or ETOH.    Anjela Cassara Tarpley-Carter, MSW, LCSW-A Pronouns:  She/Her/Hers [place] Transitions of Care Clinical Social Worker Direct Number:  [phone] [email or web address]   CAGE-AID Screening:    Have You Ever Felt You Ought to Cut Down on Your Drinking or Drug Use?: No Have People Annoyed You By [employer] Your Drinking Or Drug Use?: No Have You Felt Bad Or Guilty About Your Drinking Or Drug Use?: No Have You Ever Had a Drink or Used Drugs First Thing In The Morning to Steady Your Nerves or to Get Rid of a Hangover?: No CAGE-AID Score: 0  Substance Abuse Education Offered: No

## 2022-01-19 NOTE — ED Notes (Signed)
Patient's daughter

## 2022-01-19 NOTE — Progress Notes (Signed)
Lower extremity venous bilateral study completed.  Preliminary results relayed to Renne Crigler, MD.  See CV Proc for preliminary results report.   Darlin Coco, RDMS, RVT

## 2022-01-19 NOTE — Evaluation (Signed)
Occupational Therapy Evaluation Patient Details Name: Kent Flowers MRN: [id number] DOB: 1946-04-15 Today's Date: 01/19/2022   History of Present Illness Kent Flowers is a 76 y.o. male admitted 2/1 presents to the ED today via EMS complaining of severe neck pain and right index finger pain after a mechanical fall yesterday.  CT C-spine showing type II dens fracture with mild posterior angulation of the proximal fragment.  Pt had right hip fracture in Dec 2022 and had only been home 10 days prior to this fall.  He went to [facility] per daughter. PMH:  COPD, right apical lung mass, history of penile squamous cell carcinoma, BPH, chronic low back pain, osteoarthritis of knees, ectatic thoracic aorta, MSSA bacteremia secondary to thoracic epidural abscess in January 2021, cervical thoracic discitis/osteomyelitis and February 2021, CBD stone status post ERCP at [facility] in April 2022.   Clinical Impression   PTA patient used RW for mobility and needed some assist with ADLs.  He was admitted for above and is limited by problem list below including impaired balance, decreased activity tolerance, pain, generalized weakness, and impaired cognition.  Patient oriented and following simple commands, presents with decreased STM, awareness and problem solving.  He requires up to max assist +2 for bed mobility, mod assist +2 for sit to stand, and up to total assist for ADLs.  He was educated on precautions, brace wear schedule, safety and recommendations.   Based on performance today, believe he will benefit from further OT services acutely and after dc at SNF level to optimize independence, safety and reduce risk of falls.       Recommendations for follow up therapy are one component of a multi-disciplinary discharge planning process, led by the attending physician.  Recommendations may be updated based on patient status, additional functional criteria and insurance authorization.   Follow  Up Recommendations  Skilled nursing-short term rehab (<3 hours/day)    Assistance Recommended at Discharge Frequent or constant Supervision/Assistance  Patient can return home with the following A lot of help with walking and/or transfers;A lot of help with bathing/dressing/bathroom;Assistance with cooking/housework;Direct supervision/assist for medications management;Direct supervision/assist for financial management;Assist for transportation;Help with stairs or ramp for entrance    Functional Status Assessment  Patient has had a recent decline in their functional status and demonstrates the ability to make significant improvements in function in a reasonable and predictable amount of time.  Equipment Recommendations  BSC/3in1    Recommendations for Other Services       Precautions / Restrictions Precautions Precautions: Fall;Cervical Precaution Booklet Issued: Yes (comment) Precaution Comments: reviewed with pt Required Braces or Orthoses: Cervical Brace;Splint/Cast Cervical Brace: At all times;Hard collar Splint/Cast: index finger right hand splint Restrictions Weight Bearing Restrictions: No      Mobility Bed Mobility Overal bed mobility: Needs Assistance Bed Mobility: Rolling, Sidelying to Sit, Sit to Sidelying Rolling: Mod assist, +2 for physical assistance Sidelying to sit: Min assist, +2 for physical assistance, HOB elevated     Sit to sidelying: Max assist, +2 for physical assistance, +2 for safety/equipment General bed mobility comments: Pt needed mod assist to roll with neck pain reported however once on side and mod assist for LEs, was able to come to sitting with min assist and used his right UE to push up well. Returned to sidelying with max assist +2 for trunk and LB support    Transfers Overall transfer level: Needs assistance Equipment used: 2 person hand held assist Transfers: Sit to/from Stand Sit  to Stand: Mod assist, +2 physical assistance            General transfer comment: Pt needed mod assist to power up with max cues for how to safely place UEs with pt wanting to pull hands toward chest instead of widening his BOS with hands outstreched. Pt with posterior lean because of this therefore PT placed hand at small of pts back and gave tactile cues for anterior lean which helped pt stand much better.  Pt had to sit x 2 and PT/OT changed all of pts linens as he was wet and his condom cath came off.  Pt then stood again with same assist and was able to side step to [facility] with mod assist for support and same tactile cues as above. Pt fatigues quickly and when he does, he loses balance posteriorly.      Balance Overall balance assessment: Needs assistance Sitting-balance support: No upper extremity supported, Feet supported Sitting balance-Leahy Scale: Poor Sitting balance - Comments: relies on UE support initially but did gain static balance with min guard assist once he was positioned well. Postural control: Posterior lean Standing balance support: Bilateral upper extremity supported, During functional activity Standing balance-Leahy Scale: Poor Standing balance comment: relies on UE support for balance. Was not using right hand like he could for some reason.  Needs mod assist of 2.                           ADL either performed or assessed with clinical judgement   ADL Overall ADL's : Needs assistance/impaired     Grooming: Sitting;Moderate assistance   Upper Body Bathing: Moderate assistance;Sitting   Lower Body Bathing: Total assistance;Sit to/from stand;+2 for safety/equipment;+2 for physical assistance   Upper Body Dressing : Moderate assistance;Sitting   Lower Body Dressing: +2 for safety/equipment;+2 for physical assistance;Total assistance;Sit to/from stand   Toilet Transfer: Moderate assistance;+2 for physical assistance;+2 for safety/equipment Toilet Transfer Details (indicate cue type and reason): simulated side  stepping towards HOB Toileting- Clothing Manipulation and Hygiene: +2 for physical assistance;Total assistance;+2 for safety/equipment;Sit to/from stand       Functional mobility during ADLs: +2 for physical assistance;+2 for safety/equipment;Moderate assistance       Vision Baseline Vision/History: 1 Wears glasses Ability to See in Adequate Light: 0 Adequate Patient Visual Report: No change from baseline Vision Assessment?: No apparent visual deficits Additional Comments: glasses were broken in fall     Perception     Praxis      Pertinent Vitals/Pain Pain Assessment Pain Assessment: Faces Faces Pain Scale: Hurts whole lot Pain Location: neck, right leg Pain Descriptors / Indicators: Aching, Discomfort, Grimacing, Guarding Pain Intervention(s): Limited activity within patient's tolerance, Monitored during session, Repositioned     Hand Dominance Left (was R handed prior to surgery to UE)   Extremity/Trunk Assessment Upper Extremity Assessment Upper Extremity Assessment: Generalized weakness;RUE deficits/detail RUE Deficits / Details: reports hx of R UE surgery and unable to grasp with UE, limited functional use but able to raise at shoulder and bend elbow   Lower Extremity Assessment Lower Extremity Assessment: Defer to PT evaluation RLE Deficits / Details: grossly 3-/5 with pain LLE Deficits / Details: grossly 3/5   Cervical / Trunk Assessment Cervical / Trunk Assessment: Kyphotic   Communication Communication Communication: HOH   Cognition Arousal/Alertness: Awake/alert Behavior During Therapy: WFL for tasks assessed/performed Overall Cognitive Status: Impaired/Different from baseline Area of Impairment: Following commands, Safety/judgement, Awareness, Problem solving,  Memory                     Memory: Decreased short-term memory Following Commands: Follows one step commands consistently, Follows one step commands with increased time, Follows  multi-step commands inconsistently Safety/Judgement: Decreased awareness of safety, Decreased awareness of deficits Awareness: Emergent Problem Solving: Slow processing, Decreased initiation, Difficulty sequencing, Requires verbal cues, Requires tactile cues General Comments: daughter correcting pt during session with PLOF status, she  reports increased difficulty with STM and confusing day/night recently.  He is oriented and follows simple commands but demonstrates poor awareness, safety and problem solving.     General Comments  VSS on 2L [place]    Exercises     Shoulder Instructions      Home Living Family/patient expects to be discharged to:: Private residence Living Arrangements: Children Available Help at Discharge: Family;Available PRN/intermittently (daughter who pt lives with is bipolar and schizophrenic and unreliable) Type of Home: House Home Access: Stairs to enter [employer] of Steps: 4-5 front. 5-6 back, both with rails on both sides but wide Entrance Stairs-Rails: Can reach both Home Layout: One level     Bathroom Shower/Tub: Occupational psychologist: Handicapped height     Home Equipment: Conservation officer, nature (2 wheels);Rollator (4 wheels);Cane - single point;Shower seat - built in;Grab bars - tub/shower   Additional Comments: fell 2 x since he came home from rehab      Prior Functioning/Environment Prior Level of Function : Needs assist             Mobility Comments: used RW ADLs Comments: can dress and bathe with some assist (increased assist for pants, but could don shorts)        OT Problem List: Decreased strength;Decreased activity tolerance;Impaired balance (sitting and/or standing);Pain;Impaired UE functional use;Decreased knowledge of precautions;Decreased knowledge of use of DME or AE;Decreased safety awareness;Decreased cognition;Decreased coordination      OT Treatment/Interventions: Self-care/ADL training;Therapeutic  exercise;DME and/or AE instruction;Therapeutic activities;Balance training;Patient/family education;Cognitive remediation/compensation    OT Goals(Current goals can be found in the care plan section) Acute Rehab OT Goals Patient Stated Goal: less pain OT Goal Formulation: With patient Time For Goal Achievement: 02/02/22 Potential to Achieve Goals: Good  OT Frequency: Min 2X/week    Co-evaluation PT/OT/SLP Co-Evaluation/Treatment: Yes Reason for Co-Treatment: Complexity of the patient's impairments (multi-system involvement);For patient/therapist safety;To address functional/ADL transfers PT goals addressed during session: Mobility/safety with mobility OT goals addressed during session: ADL's and self-care      AM-PAC OT "6 Clicks" Daily Activity     Outcome Measure Help from another person eating meals?: Total Help from another person taking care of personal grooming?: A Lot Help from another person toileting, which includes using toliet, bedpan, or urinal?: Total Help from another person bathing (including washing, rinsing, drying)?: A Lot Help from another person to put on and taking off regular upper body clothing?: A Lot Help from another person to put on and taking off regular lower body clothing?: Total 6 Click Score: 9   End of Session Equipment Utilized During Treatment: Oxygen Nurse Communication: Mobility status  Activity Tolerance: Patient tolerated treatment well Patient left: in bed;with call bell/phone within reach;with family/visitor present  OT Visit Diagnosis: Other abnormalities of gait and mobility (R26.89);Muscle weakness (generalized) (M62.81);Pain;Other symptoms and signs involving cognitive function;History of falling (Z91.81) Pain - Right/Left: Right Pain - part of body: Leg (and neck)  Time: 2158-7276 OT Time Calculation (min): 35 min Charges:  OT General Charges $OT Visit: 1 Visit OT Evaluation $OT Eval Moderate Complexity: 1  Mod  Jolaine Artist, OT Acute Rehabilitation Services Pager [phone] Office [phone]   Delight Stare 01/19/2022, 12:52 PM

## 2022-01-19 NOTE — Assessment & Plan Note (Addendum)
-  On admission, EDP discussed with Dr. Lenon Curt from hand surgery, recommended continuing finger splint and outpatient follow-up in a week.

## 2022-01-19 NOTE — Evaluation (Signed)
Physical Therapy Evaluation Patient Details Name: Kent Flowers MRN: [id number] DOB: 1946/09/03 Today's Date: 01/19/2022  History of Present Illness  Kent Flowers is a 76 y.o. male admitted 2/1 presents to the ED today via EMS complaining of severe neck pain and right index finger pain after a mechanical fall yesterday.  CT C-spine showing type II dens fracture with mild posterior angulation of the proximal fragment.  Pt had right hip fracture in Dec 2022 and had only been home 10 days prior to this fall.  He went to [facility] per daughter. PMH:  COPD, right apical lung mass, history of penile squamous cell carcinoma, BPH, chronic low back pain, osteoarthritis of knees, ectatic thoracic aorta, MSSA bacteremia secondary to thoracic epidural abscess in January 2021, cervical thoracic discitis/osteomyelitis and February 2021, CBD stone status post ERCP at [facility] in April 2022.  Clinical Impression  Pt admitted with above diagnosis. Pt in need of SNF for rehab as he needs +2 mod assist at present time and is very fearful of falling (2 falls recently).  He is also limited by pain in right LE and neck. Will follow acutely.  Pt currently with functional limitations due to the deficits listed below (see PT Problem List). Pt will benefit from skilled PT to increase their independence and safety with mobility to allow discharge to the venue listed below.          Recommendations for follow up therapy are one component of a multi-disciplinary discharge planning process, led by the attending physician.  Recommendations may be updated based on patient status, additional functional criteria and insurance authorization.  Follow Up Recommendations Skilled nursing-short term rehab (<3 hours/day) (daughters prefer [facility])    Assistance Recommended at Discharge Frequent or constant Supervision/Assistance  Patient can return home with the following  Two people to help  with walking and/or transfers;A lot of help with bathing/dressing/bathroom;Assistance with feeding;Help with stairs or ramp for entrance    Equipment Recommendations Other (comment) (TBA)  Recommendations for Other Services       Functional Status Assessment Patient has had a recent decline in their functional status and demonstrates the ability to make significant improvements in function in a reasonable and predictable amount of time.     Precautions / Restrictions Precautions Precautions: Fall;Cervical Precaution Booklet Issued: Yes (comment) Required Braces or Orthoses: Cervical Brace;Splint/Cast Cervical Brace: At all times;Hard collar Splint/Cast: index finger right hand splint Restrictions Weight Bearing Restrictions: No      Mobility  Bed Mobility Overal bed mobility: Needs Assistance Bed Mobility: Rolling, Sidelying to Sit Rolling: Mod assist, +2 for physical assistance Sidelying to sit: Min assist, +2 for physical assistance, HOB elevated       General bed mobility comments: Pt needed mod assist to roll with neck pain reported however once on side and mod assist for LEs, was able to come to sitting with min assist and used his right UE to push up well.    Transfers Overall transfer level: Needs assistance Equipment used: 2 person hand held assist Transfers: Sit to/from Stand Sit to Stand: Mod assist, +2 physical assistance           General transfer comment: Pt needed mod assist to power up with max cues for how to safely place UEs with pt wanting to pull hands toward chest instead of widening his BOS with hands outstreched. Pt with posterior lean because of this therefore PT placed hand at small of pts back  and gave tactile cues for anterior lean which helped pt stand much better.  Pt had to sit x 2 and PT/OT changed all of pts linens as he was wet and his condom cath came off.  Pt then stood again with same assist and was able to side step to [facility] with mod assist  for support and same tactile cues as above. Pt fatigues quickly and when he does, he loses balance posteriorly.    Ambulation/Gait                  Stairs            Wheelchair Mobility    Modified Rankin (Stroke Patients Only)       Balance Overall balance assessment: Needs assistance Sitting-balance support: No upper extremity supported, Feet supported Sitting balance-Leahy Scale: Poor Sitting balance - Comments: relies on UE support initially but did gain static balance with min guard assist once he was positioned well. Postural control: Posterior lean Standing balance support: Bilateral upper extremity supported, During functional activity Standing balance-Leahy Scale: Poor Standing balance comment: relies on UE support for balance. Was not using right hand like he could for some reason.  Needs mod assist of 2.                             Pertinent Vitals/Pain Pain Assessment Pain Assessment: Faces Faces Pain Scale: Hurts whole lot Pain Location: neck, right leg Pain Descriptors / Indicators: Aching, Discomfort, Grimacing, Guarding Pain Intervention(s): Limited activity within patient's tolerance, Monitored during session, Repositioned    Home Living Family/patient expects to be discharged to:: Private residence Living Arrangements: Children (kids dont live in town) Available Help at Discharge: Family;Available PRN/intermittently (Daughter that he lives with has bipolar disorder and can be unreliable per daughter in room.) Type of Home: House Home Access: Stairs to enter Entrance Stairs-Rails: Can reach both Entrance Stairs-Number of Steps: 4-5 front. 5-6 back, both with rails on both sides but wide   Home Layout: One level Home Equipment: Conservation officer, nature (2 wheels);Rollator (4 wheels);Cane - single point;Shower seat - built in;Grab bars - tub/shower Additional Comments: fell 2 x since he came home from rehab    Prior Function                Mobility Comments: used RW ADLs Comments: could do shorts and tshirt on his own     Hand Dominance   Dominant Hand: Right    Extremity/Trunk Assessment   Upper Extremity Assessment Upper Extremity Assessment: Defer to OT evaluation    Lower Extremity Assessment Lower Extremity Assessment: RLE deficits/detail;LLE deficits/detail RLE Deficits / Details: grossly 3-/5 with pain LLE Deficits / Details: grossly 3/5    Cervical / Trunk Assessment Cervical / Trunk Assessment: Kyphotic  Communication   Communication: HOH  Cognition Arousal/Alertness: Awake/alert Behavior During Therapy: WFL for tasks assessed/performed Overall Cognitive Status: Within Functional Limits for tasks assessed                                 General Comments: pt had been confused at times and forgetful per daughter in room.        General Comments General comments (skin integrity, edema, etc.): 66 bpm, 96% 2L, 128/69    Exercises     Assessment/Plan    PT Assessment Patient needs continued PT services  PT Problem List Decreased balance;Decreased  mobility;Decreased knowledge of use of DME;Decreased safety awareness;Decreased knowledge of precautions;Cardiopulmonary status limiting activity;Pain;Decreased activity tolerance;Decreased strength;Decreased range of motion       PT Treatment Interventions DME instruction;Gait training;Functional mobility training;Therapeutic activities;Therapeutic exercise;Balance training;Patient/family education    PT Goals (Current goals can be found in the Care Plan section)  Acute Rehab PT Goals Patient Stated Goal: to go to rehab PT Goal Formulation: With patient/family Time For Goal Achievement: 02/02/22 Potential to Achieve Goals: Fair    Frequency Min 4X/week     Co-evaluation PT/OT/SLP Co-Evaluation/Treatment: Yes Reason for Co-Treatment: Complexity of the patient's impairments (multi-system involvement);For patient/therapist  safety PT goals addressed during session: Mobility/safety with mobility         AM-PAC PT "6 Clicks" Mobility  Outcome Measure Help needed turning from your back to your side while in a flat bed without using bedrails?: Total Help needed moving from lying on your back to sitting on the side of a flat bed without using bedrails?: Total Help needed moving to and from a bed to a chair (including a wheelchair)?: Total Help needed standing up from a chair using your arms (e.g., wheelchair or bedside chair)?: Total Help needed to walk in hospital room?: Total Help needed climbing 3-5 steps with a railing? : Total 6 Click Score: 6    End of Session Equipment Utilized During Treatment: Gait belt;Oxygen Activity Tolerance: Patient limited by fatigue Patient left: with call bell/phone within reach;with family/visitor present (on stretcher) Nurse Communication: Mobility status (condom cath needed replacement) PT Visit Diagnosis: Unsteadiness on feet (R26.81);Muscle weakness (generalized) (M62.81);Pain Pain - Right/Left: Right Pain - part of body: Leg (neck)    Time: 7591-6384 PT Time Calculation (min) (ACUTE ONLY): 27 min   Charges:   PT Evaluation $PT Eval Moderate Complexity: 1 Mod          Betha Shadix M,PT Acute Rehab Services [phone] (pager)   Alvira Philips 01/19/2022, 11:26 AM

## 2022-01-19 NOTE — Assessment & Plan Note (Addendum)
-  On exam, right lower extremity erythematous, warm to touch, and swollen. DVT US negative.  Has been placed on doxycycline with improvement.  Continue for 2 additional days.

## 2022-01-19 NOTE — Assessment & Plan Note (Addendum)
-  patient had a mechanical fall a day prior to admission and complaining of severe neck pain. Per neurosurgery, c-collar and outpatient follow-up.  Continue pain control.  Outpatient follow-up with Dr. Venetia Constable

## 2022-01-19 NOTE — H&P (Signed)
History and Physical    Patient: Kent Flowers EUM:[id number] DOB: [birth year] DOA: 01/18/2022 DOS: the patient was seen and examined on 01/19/2022 PCP: Carylon Perches, NP  Patient coming from: Home  Chief Complaint:  Chief Complaint  Patient presents with   Fall    HPI: Kent Flowers is a 76 y.o. male with medical history significant of COPD, right apical lung mass, history of penile squamous cell carcinoma, BPH, chronic low back pain, osteoarthritis of knees, ectatic thoracic aorta, MSSA bacteremia secondary to thoracic epidural abscess in January 2021, cervical thoracic discitis/osteomyelitis and February 2021, CBD stone status post ERCP at [facility] in April 2022.  He presents to the ED today via EMS complaining of severe neck pain and right index finger pain after a mechanical fall yesterday.  In the ED, vital signs stable.  Labs showing WBC 8.1, hemoglobin 10.8 (at baseline), platelet count 336k.  Sodium 138, potassium 3.5, chloride 105, bicarb 26, BUN 16, creatinine 1.2 (stable), glucose 108.  LFTs normal.  COVID and influenza PCR negative.  Blood culture x2 pending.  Lactic acid pending.  CT C-spine showing type II dens fracture with mild posterior angulation of the proximal fragment.  Neurosurgery consulted - recommended c-collar at all times and outpatient neurosurgery follow-up.  X-ray of right index finger showing minimally displaced intra-articular fracture involving the ulnar aspect of the base of the first distal phalanx.  Finger splint ordered.  CT head negative for acute finding.  Chest x-ray showing mild central vascular congestion and no acute airspace disease. Patient was given fentanyl and doxycycline.  History provided by the patient and his daughter at bedside.  Yesterday he was sitting in his recliner and wanted to get up.  As he was trying to reach his walker which was at the site of the recliner, he lost his balance and fell on the floor.  He did not lose  consciousness.  Since the fall, he is having severe pain in his neck and also pain in his right index finger.  Patient denies any preceding lightheadedness/dizziness, chest pain, shortness of breath, or palpitations.  Denies fevers, cough, abdominal pain, vomiting, or diarrhea.  Per daughter, patient had spinal infection 2 years ago which left him with right upper extremity weakness.  He had a fall 6 weeks ago and was admitted to an outside hospital for right hip fracture.  He was discharged to rehab and returned home about 10 days ago.  Review of Systems: As mentioned in the history of present illness. All other systems reviewed and are negative. Past Medical History:  Diagnosis Date   Benign localized prostatic hyperplasia with lower urinary tract symptoms (LUTS)    Chronic low back pain    Common bile duct stone 11/03/2020   COPD (chronic obstructive pulmonary disease) with emphysema ([place])    PULMOLOGIST-- DR Tarri Fuller YOUNG   Diskitis 05/10/2020   Dizziness 05/10/2020   Ectatic thoracic aorta (HCC)    ED (erectile dysfunction)    Fecal incontinence 03/24/2020   Full dentures    Gross hematuria    History of squamous cell carcinoma in situ    03-14-2013---  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   Wears glasses    Past Surgical History:  Procedure Laterality Date   CYSTOSCOPY WITH BIOPSY N/A 04/10/2017   Procedure: POSSIBLE BLADDER  BIOPSY;   Surgeon: Festus Aloe, MD;  Location: [facility];  Service: Urology;  Laterality: N/A;   CYSTOSCOPY WITH URETHRAL DILATATION N/A 04/10/2017   Procedure: CYSTOSCOPY WITH BALLOON URETHRALSTRICTURE DILATATION;  Surgeon: Festus Aloe, MD;  Location: [facility];  Service: Urology;  Laterality: N/A;   PENILE BIOPSY N/A 03/14/2013   Procedure: PENILE BIOPSY;  Surgeon: Fredricka Bonine, MD;  Location: [facility];  Service: Urology;  Laterality: N/A;   REATTACHMENT LEFT INDEX FINGER  1988   SHOULDER ARTHROSCOPY WITH ROTATOR CUFF REPAIR AND SUBACROMIAL DECOMPRESSION Left 2004   THORACIC LAMINECTOMY FOR EPIDURAL ABSCESS N/A 12/18/2019   Procedure: THORACIC LAMINECTOMY FOR EPIDURAL ABSCESS Thoracic Two, Thoracic Three;  Surgeon: [name], MD;  Location: [facility];  Service: Neurosurgery;  Laterality: N/A;  THORACIC LAMINECTOMY FOR EPIDURAL ABSCESS Thoracic Two, Thoracic Three   TONSILLECTOMY  AS CHILD   Social History:  reports that he quit smoking about 12 years ago. His smoking use included cigarettes. He has a 12.00 pack-year smoking history. He has never used smokeless tobacco. He reports current alcohol use of about 3.0 standard drinks per week. He reports that he does not use drugs.  Allergies  Allergen Reactions   Ceclor [Cefaclor] Rash   Sulfa Antibiotics Rash and Other (See Comments)    SEVERE RASH- childhood allergy    Family History  Problem Relation Age of Onset   Emphysema Mother    Cancer Father        bile duct    Prior to Admission medications   Medication Sig Start Date End Date Taking? Authorizing Provider  albuterol (PROAIR HFA) 108 (90 Base) MCG/ACT inhaler INHALE 2 PUFFS INTO THE LUNGS EVERY 6 HOURS AS NEEDED FOR WHEEZING ORSHORTNESS OF BREATH Patient taking differently: Inhale 2 puffs into the lungs every 6 (six) hours as needed for shortness of breath or wheezing. 09/27/21  Yes Young, Tarri Fuller D, MD   gabapentin (NEURONTIN) 300 MG capsule Take 1 capsule (300 mg total) by mouth 3 (three) times daily. Patient taking differently: Take 600 mg by mouth 3 (three) times daily. 12/25/19  Yes Earlene Plater, MD  oxyCODONE (ROXICODONE) 15 MG immediate release tablet Take 15 mg by mouth 5 (five) times daily as needed for pain. 01/06/22  Yes [provider]  testosterone cypionate (DEPOTESTOSTERONE CYPIONATE) 200 MG/ML injection Inject 180 mg into the muscle every 14 (fourteen) days.  09/17/19  Yes [provider]  fluticasone-salmeterol (ADVAIR) 250-50 MCG/ACT AEPB Inhale 1 puff into the lungs every 12 (twelve) hours. Patient not taking: Reported on 01/18/2022 09/27/21   Baird Lyons D, MD  oxyCODONE 10 MG TABS Take 1 tablet (10 mg total) by mouth every 4 (four) hours while awake. Patient not taking: Reported on 01/18/2022 12/25/19   Earlene Plater, MD    Physical Exam: Vitals:   01/18/22 2300 01/19/22 0000 01/19/22 0045 01/19/22 0130  BP: 126/75 118/73 122/71 132/72  Pulse: 82 74 89 88  Resp: 13 15 14  16  Temp:      TempSrc:      SpO2: 92% 94% 95% 94%   Physical Exam Constitutional:      General: He is not in acute distress. HENT:     Head: Normocephalic and atraumatic.  Eyes:     Extraocular Movements: Extraocular movements intact.     Conjunctiva/sclera: Conjunctivae normal.  Cardiovascular:     Rate and Rhythm: Normal rate and regular rhythm.     Pulses: Normal pulses.  Pulmonary:     Effort: Pulmonary effort is normal. No respiratory distress.     Breath sounds: Normal breath sounds. No wheezing or rales.  Abdominal:     General: Bowel sounds are normal. There is no distension.     Palpations: Abdomen is soft.     Tenderness: There is no abdominal tenderness.  Musculoskeletal:        General: Swelling present.     Cervical back: Normal range of motion and neck supple.     Comments: Right lower extremity erythematous, warm to touch, and swollen.  Left lower  extremity mildly erythematous as well.  Skin:    General: Skin is warm and dry.  Neurological:     General: No focal deficit present.     Mental Status: He is alert and oriented to person, place, and time.     Comments: Moving all 4 extremities on command, no gross motor deficit.  No sensory deficit.       Data Reviewed:  Labs and imaging reviewed (see HPI).  EKG: Independently reviewed.  Sinus rhythm, LAFB.  No significant change since prior tracing.  Assessment and Plan: * Dens fracture ([place])- (present on admission) Patient had a mechanical fall yesterday and complaining of severe neck pain. CT C-spine showing type II dens fracture with mild posterior angulation of the proximal fragment.  No new neurodeficit at this time.  -Neurosurgery consulted by ED physician- recommended c-collar at all times and outpatient follow-up. -Morphine as needed for severe pain -Continue c-collar  Lower extremity edema- (present on admission) On exam, right lower extremity erythematous, warm to touch, and swollen.  Also has mild erythema of the left lower extremity. ?Cellulitis vs DVT in the setting of recent hip surgery.  No fever or leukocytosis. -Bilateral lower extremity Dopplers ordered -Continue doxycycline -Blood culture x2 pending -Lactate acid pending   Distal phalanx or phalanges, closed fracture- (present on admission) Patient complaining of right index finger pain since fall yesterday. X-ray of right index finger showing minimally displaced intra-articular fracture involving the ulnar aspect of the base of the first distal phalanx. -Finger splint ordered by ED physician -Discussed with Dr. Lenon Curt from hand surgery, recommended continuing finger splint and outpatient follow-up in a week. -Continue pain management   COPD (chronic obstructive pulmonary disease) (HCC) Stable.  No signs of acute exacerbation. -Continue home inhalers   Advance Care Planning:   Code Status: Partial Code.   Discussed with the patient and his daughter.  No CPR or defibrillation/cardioversion.  Intubation and mechanical ventilation okay.  Also okay to give ACLS medications.  Consults: Neurosurgery, hand surgery  Family Communication: Daughter Zenda Alpers) at bedside.  Severity of Illness: The appropriate patient status for this patient is OBSERVATION. Observation status is judged to be reasonable and necessary in order to provide the required intensity of service to ensure the patient's safety. The patient's presenting symptoms, physical exam findings, and initial radiographic and laboratory data in the context of their medical condition is felt to place them  at decreased risk for further clinical deterioration. Furthermore, it is anticipated that the patient will be medically stable for discharge from the hospital within 2 midnights of admission.   Author: Shela Leff, MD 01/19/2022 2:41 AM  For on call review [email or web address].

## 2022-01-20 ENCOUNTER — Observation Stay (HOSPITAL_COMMUNITY): Payer: Medicare Other

## 2022-01-20 DIAGNOSIS — N400 Enlarged prostate without lower urinary tract symptoms: Secondary | ICD-10-CM | POA: Diagnosis present

## 2022-01-20 DIAGNOSIS — J439 Emphysema, unspecified: Secondary | ICD-10-CM | POA: Diagnosis present

## 2022-01-20 DIAGNOSIS — E291 Testicular hypofunction: Secondary | ICD-10-CM | POA: Diagnosis present

## 2022-01-20 DIAGNOSIS — S12100A Unspecified displaced fracture of second cervical vertebra, initial encounter for closed fracture: Secondary | ICD-10-CM | POA: Diagnosis not present

## 2022-01-20 DIAGNOSIS — N179 Acute kidney failure, unspecified: Secondary | ICD-10-CM

## 2022-01-20 DIAGNOSIS — L03115 Cellulitis of right lower limb: Secondary | ICD-10-CM | POA: Diagnosis present

## 2022-01-20 DIAGNOSIS — Z20822 Contact with and (suspected) exposure to covid-19: Secondary | ICD-10-CM | POA: Diagnosis present

## 2022-01-20 DIAGNOSIS — S60512A Abrasion of left hand, initial encounter: Secondary | ICD-10-CM | POA: Diagnosis present

## 2022-01-20 DIAGNOSIS — D638 Anemia in other chronic diseases classified elsewhere: Secondary | ICD-10-CM | POA: Diagnosis not present

## 2022-01-20 DIAGNOSIS — F17210 Nicotine dependence, cigarettes, uncomplicated: Secondary | ICD-10-CM | POA: Diagnosis present

## 2022-01-20 DIAGNOSIS — R296 Repeated falls: Secondary | ICD-10-CM | POA: Diagnosis present

## 2022-01-20 DIAGNOSIS — Z8549 Personal history of malignant neoplasm of other male genital organs: Secondary | ICD-10-CM | POA: Diagnosis not present

## 2022-01-20 DIAGNOSIS — F1090 Alcohol use, unspecified, uncomplicated: Secondary | ICD-10-CM

## 2022-01-20 DIAGNOSIS — Z8614 Personal history of Methicillin resistant Staphylococcus aureus infection: Secondary | ICD-10-CM | POA: Diagnosis not present

## 2022-01-20 DIAGNOSIS — Z720 Tobacco use: Secondary | ICD-10-CM

## 2022-01-20 DIAGNOSIS — W010XXA Fall on same level from slipping, tripping and stumbling without subsequent striking against object, initial encounter: Secondary | ICD-10-CM | POA: Diagnosis present

## 2022-01-20 DIAGNOSIS — G8191 Hemiplegia, unspecified affecting right dominant side: Secondary | ICD-10-CM | POA: Diagnosis present

## 2022-01-20 DIAGNOSIS — G8929 Other chronic pain: Secondary | ICD-10-CM | POA: Diagnosis present

## 2022-01-20 DIAGNOSIS — N1831 Chronic kidney disease, stage 3a: Secondary | ICD-10-CM | POA: Diagnosis present

## 2022-01-20 DIAGNOSIS — Z79890 Hormone replacement therapy (postmenopausal): Secondary | ICD-10-CM | POA: Diagnosis not present

## 2022-01-20 DIAGNOSIS — Z789 Other specified health status: Secondary | ICD-10-CM

## 2022-01-20 DIAGNOSIS — S12110A Anterior displaced Type II dens fracture, initial encounter for closed fracture: Secondary | ICD-10-CM | POA: Diagnosis present

## 2022-01-20 DIAGNOSIS — S0081XA Abrasion of other part of head, initial encounter: Secondary | ICD-10-CM | POA: Diagnosis present

## 2022-01-20 DIAGNOSIS — D631 Anemia in chronic kidney disease: Secondary | ICD-10-CM | POA: Diagnosis present

## 2022-01-20 DIAGNOSIS — Z79899 Other long term (current) drug therapy: Secondary | ICD-10-CM | POA: Diagnosis not present

## 2022-01-20 DIAGNOSIS — M545 Low back pain, unspecified: Secondary | ICD-10-CM | POA: Diagnosis present

## 2022-01-20 DIAGNOSIS — S62610A Displaced fracture of proximal phalanx of right index finger, initial encounter for closed fracture: Secondary | ICD-10-CM | POA: Diagnosis present

## 2022-01-20 DIAGNOSIS — M170 Bilateral primary osteoarthritis of knee: Secondary | ICD-10-CM | POA: Diagnosis present

## 2022-01-20 DIAGNOSIS — R918 Other nonspecific abnormal finding of lung field: Secondary | ICD-10-CM | POA: Diagnosis present

## 2022-01-20 DIAGNOSIS — Z79891 Long term (current) use of opiate analgesic: Secondary | ICD-10-CM | POA: Diagnosis not present

## 2022-01-20 DIAGNOSIS — Z7951 Long term (current) use of inhaled steroids: Secondary | ICD-10-CM | POA: Diagnosis not present

## 2022-01-20 LAB — CBC
HCT: 33.1 % — ABNORMAL LOW (ref 39.0–52.0)
Hemoglobin: 10.3 g/dL — ABNORMAL LOW (ref 13.0–17.0)
MCH: 28.5 pg (ref 26.0–34.0)
MCHC: 31.1 g/dL (ref 30.0–36.0)
MCV: 91.7 fL (ref 80.0–100.0)
Platelets: 313 10*3/uL (ref 150–400)
RBC: 3.61 MIL/uL — ABNORMAL LOW (ref 4.22–5.81)
RDW: 13.1 % (ref 11.5–15.5)
WBC: 8.1 10*3/uL (ref 4.0–10.5)
nRBC: 0 % (ref 0.0–0.2)

## 2022-01-20 LAB — COMPREHENSIVE METABOLIC PANEL
ALT: 11 U/L (ref 0–44)
AST: 13 U/L — ABNORMAL LOW (ref 15–41)
Albumin: 2.9 g/dL — ABNORMAL LOW (ref 3.5–5.0)
Alkaline Phosphatase: 101 U/L (ref 38–126)
Anion gap: 10 (ref 5–15)
BUN: 17 mg/dL (ref 8–23)
CO2: 26 mmol/L (ref 22–32)
Calcium: 8.6 mg/dL — ABNORMAL LOW (ref 8.9–10.3)
Chloride: 106 mmol/L (ref 98–111)
Creatinine, Ser: 1.36 mg/dL — ABNORMAL HIGH (ref 0.61–1.24)
GFR, Estimated: 55 mL/min — ABNORMAL LOW (ref >60)
Glucose, Bld: 103 mg/dL — ABNORMAL HIGH (ref 70–99)
Potassium: 3.6 mmol/L (ref 3.5–5.1)
Sodium: 142 mmol/L (ref 135–145)
Total Bilirubin: 0.6 mg/dL (ref 0.3–1.2)
Total Protein: 6.4 g/dL — ABNORMAL LOW (ref 6.5–8.1)

## 2022-01-20 IMAGING — DX DG KNEE 1-2V*R*
1 series · 2 of 2 positions shown · non-contrast
Comparison: None.

CLINICAL DATA: Right knee pain

EXAM:
RIGHT KNEE - 1-2 VIEW

[Series 1: knee · 0.14mm/px · 2 of 2 slices shown]
[im 1/2]
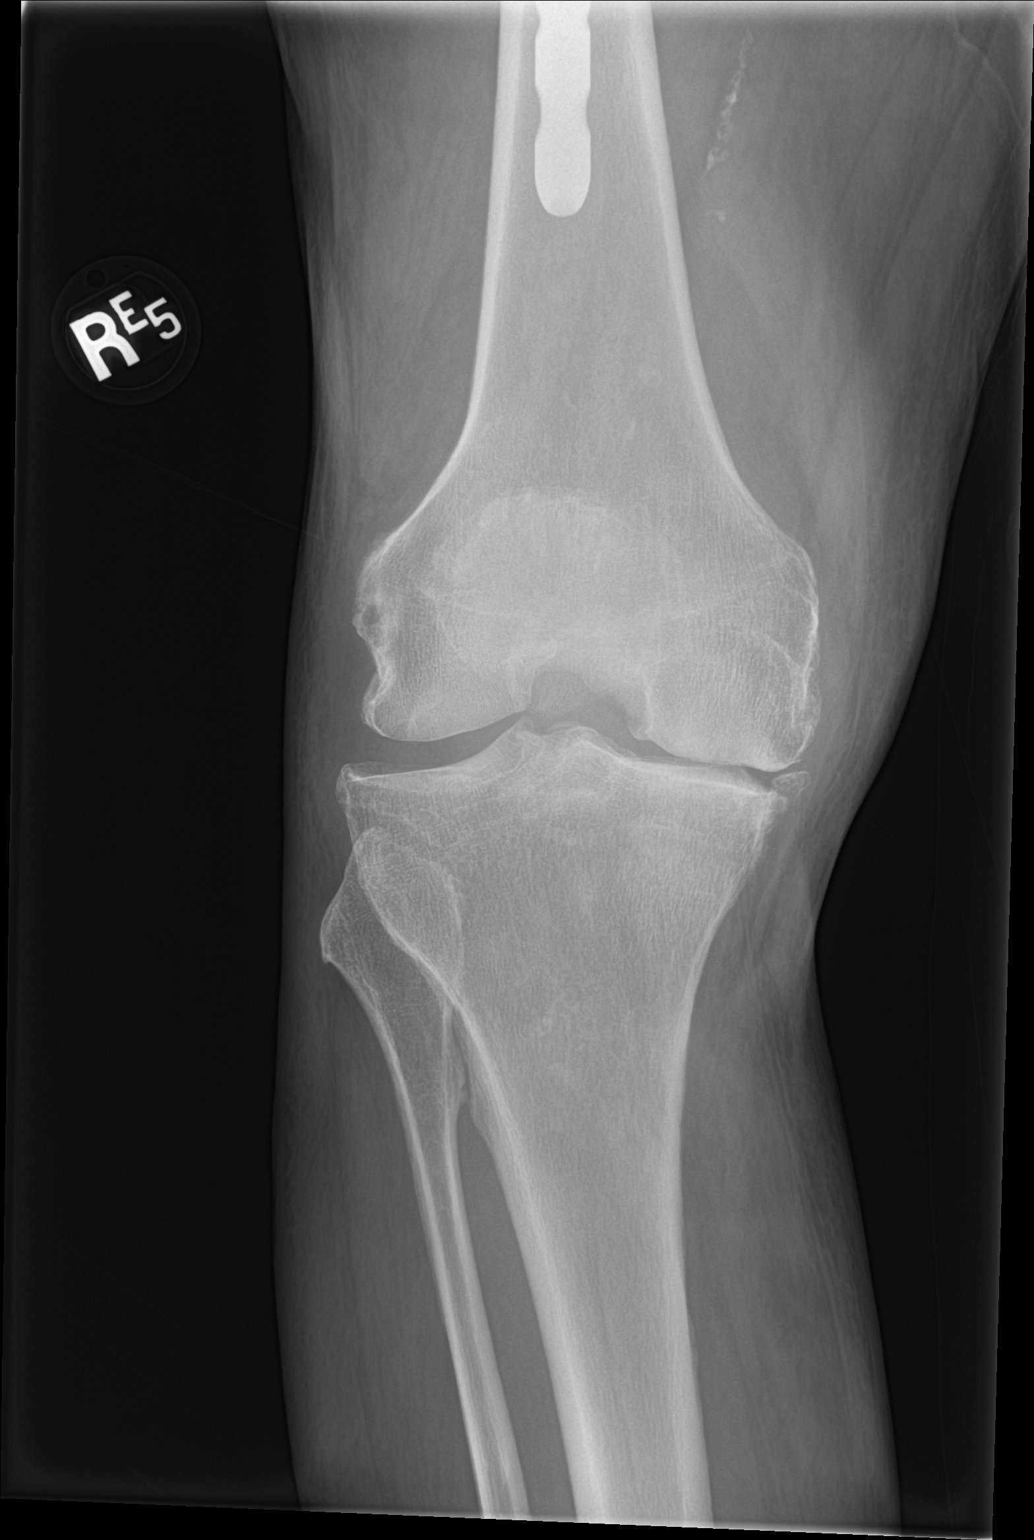
[im 2/2]
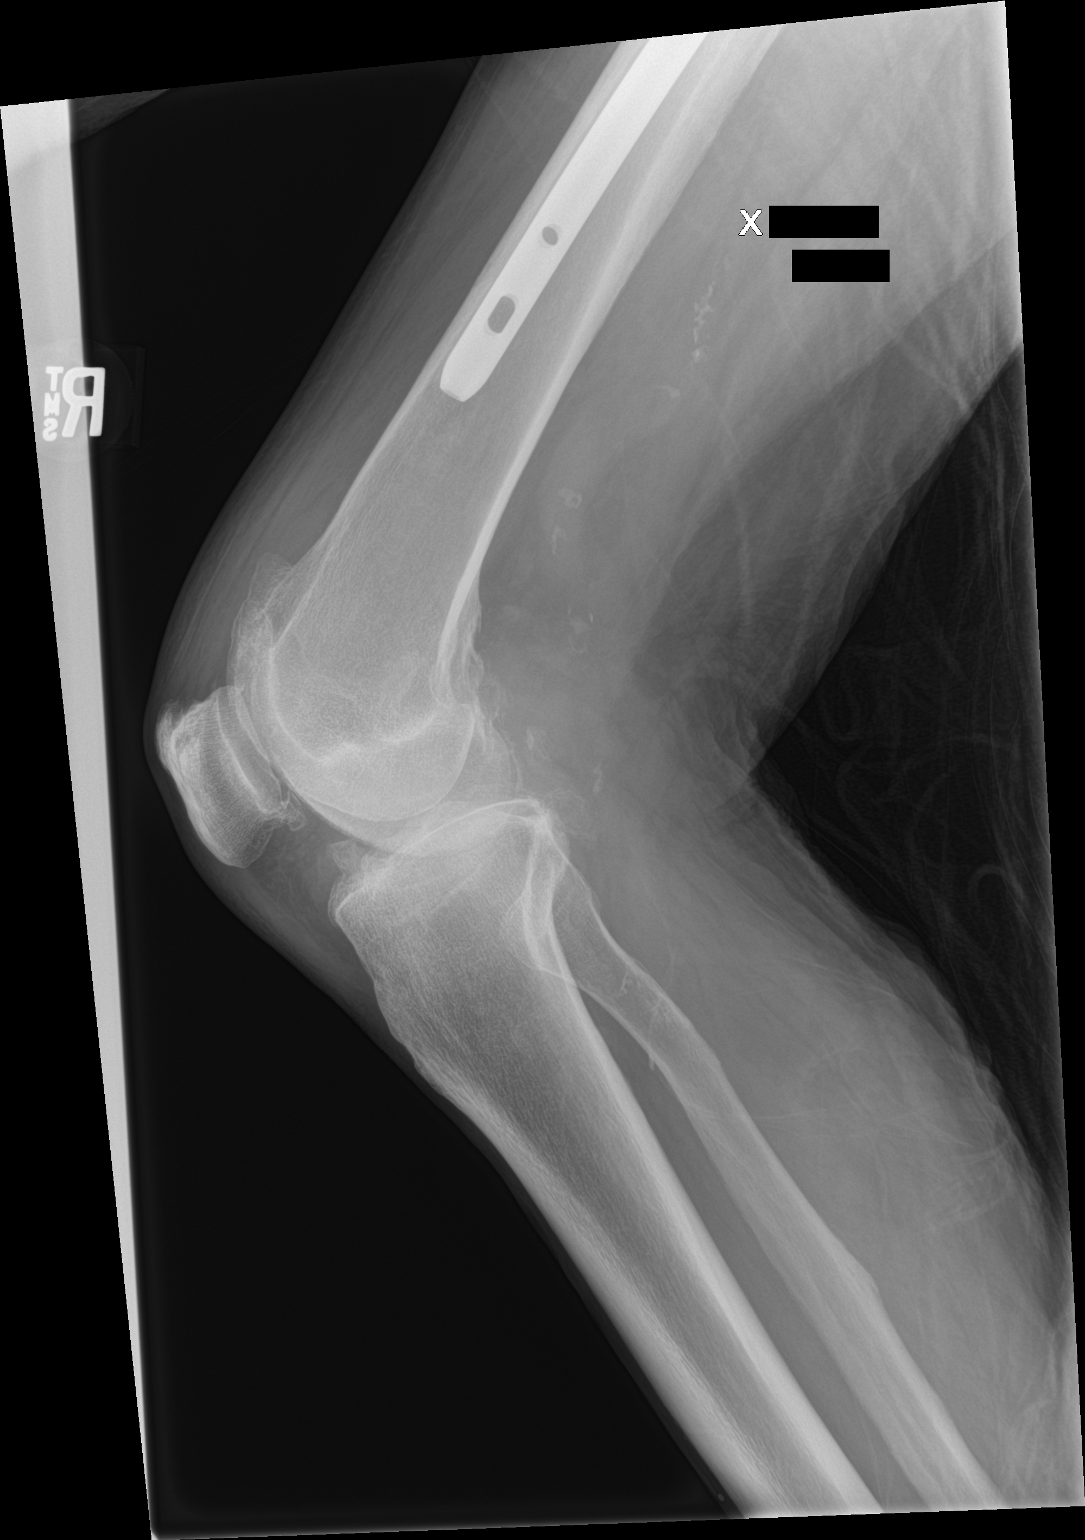

[2 of 2 positions shown; findings below may reference images not displayed]

FINDINGS: No acute fracture or dislocation identified. Moderate narrowing of
the medial femorotibial compartment and patellofemoral joint.
Associated subchondral sclerosis and marginal osteophytes. Small
joint effusion.
IMPRESSION: Degenerative changes and small joint effusion.

## 2022-01-20 IMAGING — DX DG KNEE 1-2V*L*
1 series · 2 of 2 positions shown · non-contrast
Comparison: None.

CLINICAL DATA: Left knee pain

EXAM:
LEFT KNEE - 1-2 VIEW

[Series 1: knee · 0.14mm/px · 2 of 2 slices shown]
[im 1/2]
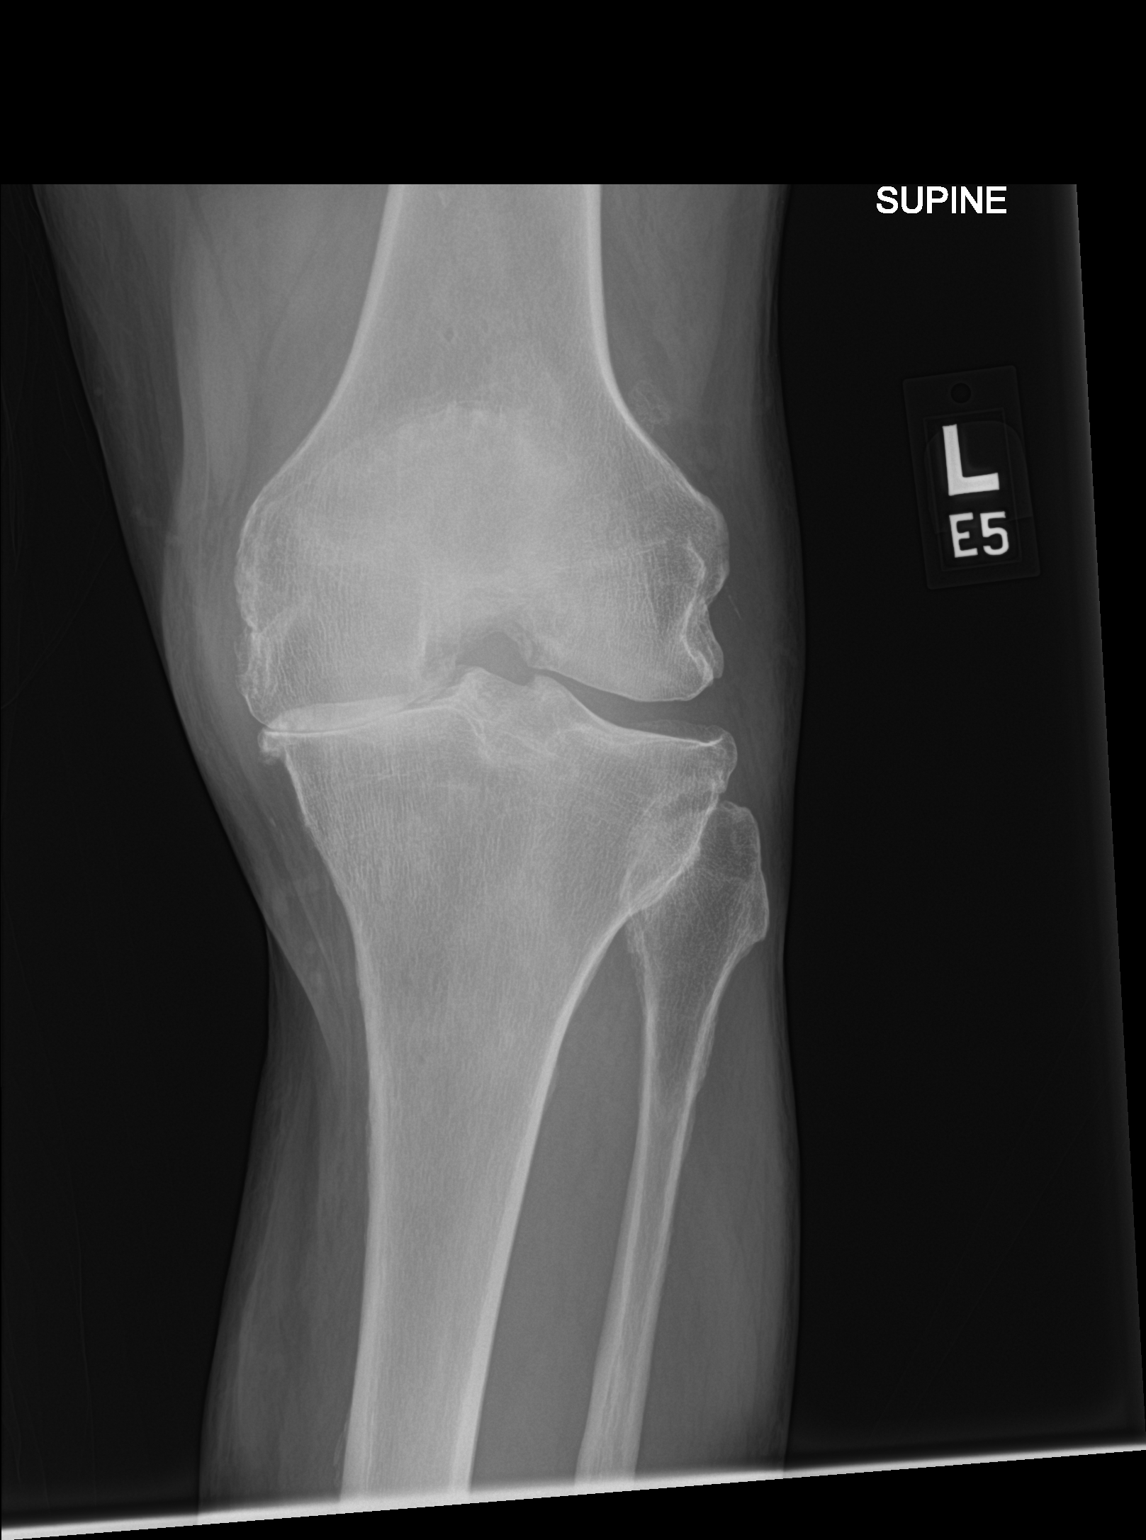
[im 2/2]
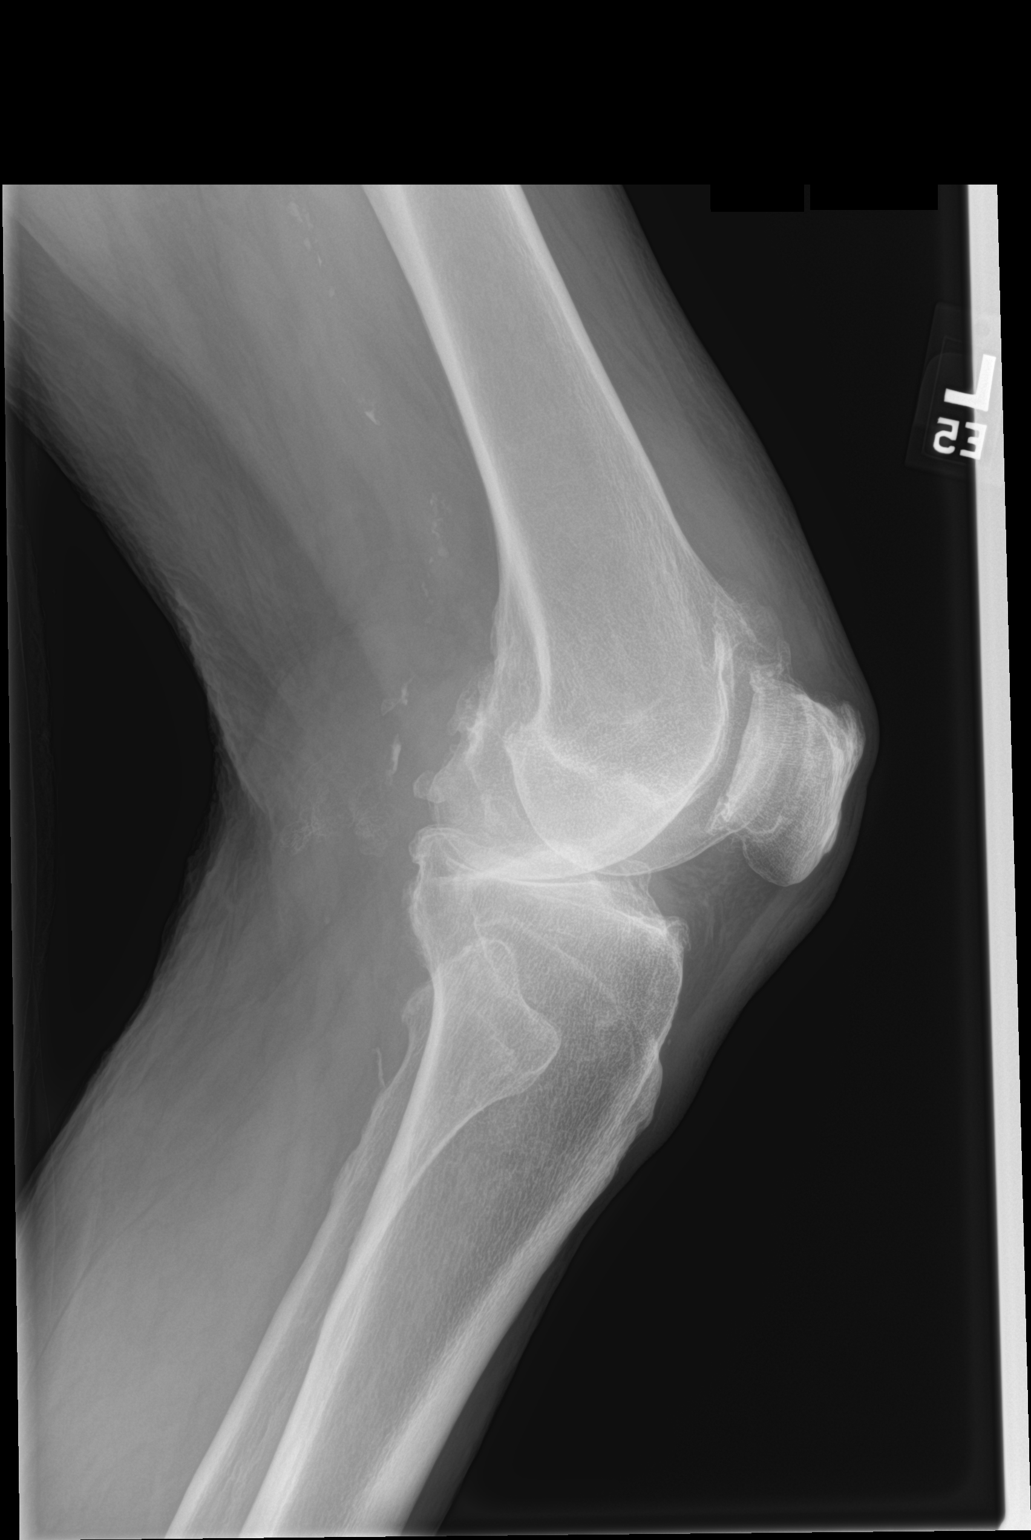

[2 of 2 positions shown; findings below may reference images not displayed]

FINDINGS: No acute fracture or dislocation identified. Severe narrowing of the
medial femorotibial compartment and mild narrowing of the
patellofemoral joint. Associated subchondral sclerosis and marginal
osteophytes. Small joint effusion.
IMPRESSION: Degenerative changes and small joint effusion.

## 2022-01-20 MED ORDER — OXYCODONE HCL 5 MG PO TABS
15.0000 mg | ORAL_TABLET | ORAL | Status: DC | PRN
  Administered 2022-01-20 (×3): 15 mg via ORAL
  Filled 2022-01-20 (×3): qty 3

## 2022-01-20 MED ORDER — DOXYCYCLINE HYCLATE 100 MG PO TABS: 100.0000 mg | ORAL_TABLET | Freq: Two times a day (BID) | ORAL | 0 refills | Status: AC

## 2022-01-20 MED ORDER — ALBUTEROL SULFATE HFA 108 (90 BASE) MCG/ACT IN AERS
2.0000 | INHALATION_SPRAY | RESPIRATORY_TRACT | Status: DC | PRN
  Administered 2022-01-20: 2 via RESPIRATORY_TRACT
  Filled 2022-01-20: qty 6.7

## 2022-01-20 MED ORDER — OXYCODONE HCL 15 MG PO TABS: 15.0000 mg | ORAL_TABLET | Freq: Every day | ORAL | 0 refills | Status: DC | PRN

## 2022-01-20 MED ORDER — DOXYCYCLINE HYCLATE 100 MG PO TABS
100.0000 mg | ORAL_TABLET | Freq: Two times a day (BID) | ORAL | Status: DC
  Administered 2022-01-20: 100 mg via ORAL
  Filled 2022-01-20: qty 1

## 2022-01-20 NOTE — Progress Notes (Deleted)
PROGRESS NOTE  ALICE VITELLI IRJ:[id number] DOB: 12-Feb-1946 DOA: 01/18/2022 PCP: Carylon Perches, NP   LOS: 0 days   Brief Narrative / Interim history: 76 year old male with history of COPD, right apical lung mass, penile squamous cell carcinoma, BPH, chronic low back pain, osteoarthritis, MSSA bacteremia in January 2021, discitis/osteomyelitis, came into the hospital with neck and finger pain after a fall at home.  Of note, he was recently hospitalized couple months ago for fall and hip fracture, and was discharged to an SNF.  He was home for the past 10 days but continued to be weak and had 2 falls, one of them resulting in his hospital presentation.  Lactic acid pending.  CT C-spine showing type II dens fracture with mild posterior angulation of the proximal fragment.  Neurosurgery consulted - recommended c-collar at all times and outpatient neurosurgery follow-up.  X-ray of right index finger showing minimally displaced intra-articular fracture involving the ulnar aspect of the base of the first distal phalanx.  Finger splint ordered per orthopedic surgery   Subjective / 24h Interval events: Complains of right knee pain.  He wants his home pain regimen restarted  Assessment & Plan: * Dens fracture ([place])- (present on admission) -patient had a mechanical fall a day prior to admission and complaining of severe neck pain. Per neurosurgery, c-collar and outpatient follow-up.  Continue pain control.  Anemia of chronic disease -hb stable, no bleeding  Stage 3a chronic kidney disease (CKD) (HCC) -Baseline creatinine 1.2-1.3, currently at baseline  Alcohol use -used to drink more in the past but not anymore.  Monitor  Tobacco use -cut down significantly but still smoking few cigarettes here and there.  Recurrent falls -needs SNF  COPD (chronic obstructive pulmonary disease) ([place]) -Stable.  No signs of acute exacerbation.  Lower extremity edema- (present on admission) -On exam, right  lower extremity erythematous, warm to touch, and swollen. DVT US negative. On doxy  Distal phalanx or phalanges, closed fracture- (present on admission) -Discussed with Dr. Lenon Curt from hand surgery, recommended continuing finger splint and outpatient follow-up in a week.  History of vertebral osteomyelitis, epidural abscess-completed treatment   Recent hip fracture, persistent deconditioning-just was in rehab, but upon going home had recurrent falls.  Suspect he will need rehab again.   Scheduled Meds:  mometasone-formoterol  2 puff Inhalation BID   Continuous Infusions:  doxycycline (VIBRAMYCIN) IV 100 mg (01/19/22 2258)   PRN Meds:.acetaminophen **OR** acetaminophen, albuterol, morphine injection, oxyCODONE  Diet Orders (From admission, onward)     Start     Ordered   01/19/22 1344  Diet regular Room service appropriate? Yes; Fluid consistency: Thin  Diet effective now       Question Answer Comment  Room service appropriate? Yes   Fluid consistency: Thin      01/19/22 1343            DVT prophylaxis:    Lab Results  Component Value Date   PLT 313 01/20/2022      Code Status: Partial Code  Family Communication: No family at bedside  Status is: Inpatient  Remains inpatient appropriate because: Awaiting placement  Level of care: Med-Surg  Consultants:  none  Procedures:  none  Microbiology  none  Antimicrobials: Doxycycline 2/2 >>    Objective: Vitals:   01/19/22 1939 01/19/22 2321 01/20/22 0319 01/20/22 0743  BP: (!) 142/75 112/64 125/73 (!) 156/93  Pulse: 86 71 67 78  Resp: 19 17 17 16   Temp: 98.4 F (36.9 C) 99.2 F (  37.3 C) 98.3 F (36.8 C) 99 F (37.2 C)  TempSrc: Oral Oral Oral Oral  SpO2: 93% 92% 92% 96%    Intake/Output Summary (Last 24 hours) at 01/20/2022 1116 Last data filed at 01/20/2022 1018 Gross per 24 hour  Intake 730 ml  Output 700 ml  Net 30 ml   Wt Readings from Last 3 Encounters:  09/27/21 70.9 kg  05/13/21 72.1 kg   03/16/21 71.2 kg    Examination:  Constitutional: NAD Eyes: no scleral icterus ENMT: Mucous membranes are moist.  Neck: normal, supple Respiratory: clear to auscultation bilaterally, no wheezing, no crackles. Normal respiratory effort.  Cardiovascular: Regular rate and rhythm, no murmurs / rubs / gallops. No LE edema. Abdomen: non distended, no tenderness. Bowel sounds positive.  Musculoskeletal: no clubbing / cyanosis.  Skin: no rashes Neurologic: non focal    Data Reviewed: I have independently reviewed following labs and imaging studies   CBC Recent Labs  Lab 01/18/22 2034 01/20/22 0212  WBC 8.1 8.1  HGB 10.8* 10.3*  HCT 34.2* 33.1*  PLT 336 313  MCV 92.2 91.7  MCH 29.1 28.5  MCHC 31.6 31.1  RDW 13.2 13.1  LYMPHSABS 2.5  --   MONOABS 0.6  --   EOSABS 0.4  --   BASOSABS 0.0  --     Recent Labs  Lab 01/18/22 2034 01/19/22 0012 01/19/22 0417 01/20/22 0212  NA 138  --   --  142  K 3.5  --   --  3.6  CL 105  --   --  106  CO2 26  --   --  26  GLUCOSE 108*  --   --  103*  BUN 16  --   --  17  CREATININE 1.28*  --   --  1.36*  CALCIUM 8.5*  --   --  8.6*  AST 17  --   --  13*  ALT 11  --   --  11  ALKPHOS 102  --   --  101  BILITOT 0.6  --   --  0.6  ALBUMIN 3.0*  --   --  2.9*  LATICACIDVEN  --  0.7 0.6  --   BNP  --   --  86.5  --     ------------------------------------------------------------------------------------------------------------------ No results for input(s): CHOL, HDL, LDLCALC, TRIG, CHOLHDL, LDLDIRECT in the last 72 hours.  Lab Results  Component Value Date   HGBA1C 5.4 12/19/2019   ------------------------------------------------------------------------------------------------------------------ No results for input(s): TSH, T4TOTAL, T3FREE, THYROIDAB in the last 72 hours.  Invalid input(s): FREET3  Cardiac Enzymes No results for input(s): CKMB, TROPONINI, MYOGLOBIN in the last 168 hours.  Invalid input(s):  CK ------------------------------------------------------------------------------------------------------------------    Component Value Date/Time   BNP 86.5 01/19/2022 0417    CBG: No results for input(s): GLUCAP in the last 168 hours.  Recent Results (from the past 240 hour(s))  Resp Panel by RT-PCR (Flu A&B, Covid) Nasopharyngeal Swab     Status: None   Collection Time: 01/18/22  8:39 PM   Specimen: Nasopharyngeal Swab; Nasopharyngeal(NP) swabs in vial transport medium  Result Value Ref Range Status   SARS Coronavirus 2 by RT PCR NEGATIVE NEGATIVE Final    Comment: (NOTE) SARS-CoV-2 target nucleic acids are NOT DETECTED.  The SARS-CoV-2 RNA is generally detectable in upper respiratory specimens during the acute phase of infection. The lowest concentration of SARS-CoV-2 viral copies this assay can detect is 138 copies/mL. A negative result does not preclude SARS-Cov-2  infection and should not be used as the sole basis for treatment or other patient management decisions. A negative result may occur with  improper specimen collection/handling, submission of specimen other than nasopharyngeal swab, presence of viral mutation(s) within the areas targeted by this assay, and inadequate number of viral copies(<138 copies/mL). A negative result must be combined with clinical observations, patient history, and epidemiological information. The expected result is Negative.  Fact Sheet for Patients:  EntrepreneurPulse.com.au  Fact Sheet for Healthcare Providers:  IncredibleEmployment.be  This test is no t yet approved or cleared by the [place] FDA and  has been authorized for detection and/or diagnosis of SARS-CoV-2 by FDA under an Emergency Use Authorization (EUA). This EUA will remain  in effect (meaning this test can be used) for the duration of the COVID-19 declaration under Section 564(b)(1) of the Act, 21 U.S.C.section 360bbb-3(b)(1),  unless the authorization is terminated  or revoked sooner.       Influenza A by PCR NEGATIVE NEGATIVE Final   Influenza B by PCR NEGATIVE NEGATIVE Final    Comment: (NOTE) The Xpert Xpress SARS-CoV-2/FLU/RSV plus assay is intended as an aid in the diagnosis of influenza from Nasopharyngeal swab specimens and should not be used as a sole basis for treatment. Nasal washings and aspirates are unacceptable for Xpert Xpress SARS-CoV-2/FLU/RSV testing.  Fact Sheet for Patients: EntrepreneurPulse.com.au  Fact Sheet for Healthcare Providers: IncredibleEmployment.be  This test is not yet approved or cleared by the [place] FDA and has been authorized for detection and/or diagnosis of SARS-CoV-2 by FDA under an Emergency Use Authorization (EUA). This EUA will remain in effect (meaning this test can be used) for the duration of the COVID-19 declaration under Section 564(b)(1) of the Act, 21 U.S.C. section 360bbb-3(b)(1), unless the authorization is terminated or revoked.  Performed at [facility], [place] [address] [place], [place] [address]   Blood culture (routine x 2)     Status: None (Preliminary result)   Collection Time: 01/19/22 12:39 AM   Specimen: BLOOD RIGHT HAND  Result Value Ref Range Status   Specimen Description BLOOD RIGHT HAND  Final   Special Requests   Final    BOTTLES DRAWN AEROBIC AND ANAEROBIC Blood Culture results may not be optimal due to an inadequate volume of blood received in culture bottles   Culture   Final    NO GROWTH 1 DAY Performed at [facility], [place] [address] [place], [place] [address]    Report Status PENDING  Incomplete  Blood culture (routine x 2)     Status: None (Preliminary result)   Collection Time: 01/19/22 12:40 AM   Specimen: BLOOD LEFT HAND  Result Value Ref Range Status   Specimen Description BLOOD LEFT HAND  Final   Special Requests   Final    BOTTLES DRAWN AEROBIC AND ANAEROBIC  Blood Culture results may not be optimal due to an inadequate volume of blood received in culture bottles   Culture   Final    NO GROWTH 1 DAY Performed at [facility], [place] [address] [place], [place] [address]    Report Status PENDING  Incomplete     Radiology Studies: DG Knee 1-2 Views Left  Result Date: 01/20/2022 CLINICAL DATA:  Left knee pain EXAM: LEFT KNEE - 1-2 VIEW COMPARISON:  None. FINDINGS: No acute fracture or dislocation identified. Severe narrowing of the medial femorotibial compartment and mild narrowing of the patellofemoral joint. Associated subchondral sclerosis and marginal osteophytes.  Small joint effusion. IMPRESSION: Degenerative changes and small joint effusion. Electronically Signed   By: Ofilia Neas M.D.   On: 01/20/2022 09:19   DG Knee 1-2 Views Right  Result Date: 01/20/2022 CLINICAL DATA:  Right knee pain EXAM: RIGHT KNEE - 1-2 VIEW COMPARISON:  None. FINDINGS: No acute fracture or dislocation identified. Moderate narrowing of the medial femorotibial compartment and patellofemoral joint. Associated subchondral sclerosis and marginal osteophytes. Small joint effusion. IMPRESSION: Degenerative changes and small joint effusion. Electronically Signed   By: Ofilia Neas M.D.   On: 01/20/2022 09:21     Marzetta Board, MD, PhD [facility]  Between 7 am - 7 pm I am available, please contact me via Amion (for emergencies) or Securechat (non urgent messages)  Between 7 pm - 7 am I am not available, please contact night coverage MD/APP via Amion

## 2022-01-20 NOTE — Assessment & Plan Note (Signed)
-  used to drink more in the past but not anymore.  Monitor

## 2022-01-20 NOTE — TOC Initial Note (Signed)
Transition of Care [facility]) - Initial/Assessment Note    Patient Details  Name: Kent Flowers MRN: [id number] Date of Birth: 06/13/1946  Transition of [facility]) CM/SW Contact:    Geralynn Ochs, LCSW Phone Number: 01/20/2022, 1:36 PM  Clinical Narrative:        CSW met with patient and daughter at bedside to discuss recommendation for SNF. Patient and daughter agreeable, asking about [facility]. CSW explained they do not take his insurance. Patient and daughter agreeable to fax out. Daughter asked to speak to [place] privately, and asked about ALF after SNF as they're pretty confident that the patient can't take care of himself anymore. CSW to provide family with listing of ALF facilities. CSW faxed out referral for SNF, will follow up with bed offers.           Expected Discharge Plan: Skilled Nursing Facility Barriers to Discharge: Continued Medical Work up, Ship broker   Patient Goals and CMS Choice Patient states their goals for this hospitalization and ongoing recovery are:: to get better CMS Medicare.gov Compare Post Acute Care list provided to:: Patient Choice offered to / list presented to : Patient, Adult Children  Expected Discharge Plan and Services Expected Discharge Plan: [place] Acute Care Choice: [place] Living arrangements for the past 2 months: [place], [place] Expected Discharge Date: 01/20/22                                    Prior Living Arrangements/Services Living arrangements for the past 2 months: [place], [place] Lives with:: Self, Adult Children Patient language and need for interpreter reviewed:: No Do you feel safe going back to the place where you live?: Yes      Need for Family Participation in Patient Care: Yes (Comment) Care giver support system in place?: No (comment)   Criminal Activity/Legal Involvement Pertinent to Current  Situation/Hospitalization: No - Comment as needed  Activities of Daily Living Home Assistive Devices/Equipment: C-collar ADL Screening (condition at time of admission) Patient's cognitive ability adequate to safely complete daily activities?: Yes Is the patient deaf or have difficulty hearing?: No Does the patient have difficulty seeing, even when wearing glasses/contacts?: No Does the patient have difficulty concentrating, remembering, or making decisions?: No Patient able to express need for assistance with ADLs?: Yes Does the patient have difficulty dressing or bathing?: Yes Independently performs ADLs?: No Communication: Independent Does the patient have difficulty walking or climbing stairs?: Yes Weakness of Legs: Both Weakness of Arms/Hands: Right  Permission Sought/Granted Permission sought to share information with : Facility Sport and exercise psychologist, Family Supports Permission granted to share information with : Yes, Verbal Permission Granted  Share Information with NAME: Myrla Halsted  Permission granted to share info w AGENCY: SNF  Permission granted to share info w Relationship: Daughters     Emotional Assessment Appearance:: Appears stated age Attitude/Demeanor/Rapport: Engaged Affect (typically observed): Appropriate Orientation: : Oriented to Self, Oriented to Place, Oriented to  Time, Oriented to Situation Alcohol / Substance Use: Not Applicable Psych Involvement: No (comment)  Admission diagnosis:  Dens fracture ([place]) [S12.110A] Fall, initial encounter [W19.XXXA] Cellulitis of lower extremity, unspecified laterality [T61.443] Closed odontoid fracture, initial encounter ([place]) [S12.100A] Recurrent falls [R29.6] Patient Active Problem List   Diagnosis Date Noted   Recurrent falls 01/20/2022   Tobacco use 01/20/2022   Alcohol use 01/20/2022  Stage 3a chronic kidney disease (CKD) ([place]) 01/20/2022   Anemia of chronic disease 01/20/2022   Dens fracture  ([place]) 01/19/2022   Distal phalanx or phalanges, closed fracture 01/19/2022   Lower extremity edema 01/19/2022   COPD (chronic obstructive pulmonary disease) ([place]) 01/19/2022   Common bile duct stone 11/03/2020   Hyperglycemia 09/01/2020   Diskitis 05/10/2020   Dizziness 05/10/2020   Urinary incontinence 03/24/2020   Fecal incontinence 03/24/2020   Epidural abscess    Sepsis ([place]) 02/04/2020   Leg swelling 02/04/2020   Vertebral osteophyte 02/04/2020   Vertebral osteomyelitis ([place]) 02/04/2020   Pulmonary mass 01/22/2020   PICC (peripherally inserted central catheter) in place 01/22/2020   Body mass index (BMI) 27.0-27.9, adult 01/16/2020   Elevated blood-pressure reading, without diagnosis of hypertension 01/16/2020   Paresthesia of right upper extremity 12/18/2019   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/2019   Status post laminectomy 12/18/2019   Impacted cerumen of both ears 10/07/2019   Insomnia 09/22/2019   Neck pain 11/04/2017   Other social stressor 07/19/2016   COPD with acute bronchitis ([place]) 10/04/2014   Hepatitis, unspecified 03/13/2014   Chronic low back pain    Knee pain, bilateral    Right knee DJD    Hepatitis 11/03/2013   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   Aortic calcification ([place]) 11/03/2013   Coronary atherosclerosis of native coronary artery 11/03/2013   Penile neoplasm    Ascending aortic aneurysm    Lung nodule 05/23/2012   COPD mixed type ([place]) 02/24/2012   PCP:  Carylon Perches, NP Pharmacy:   [facility], [place] - [address] [place] [address] Phone: [phone] Fax: [phone]     Social Determinants of Health (SDOH) Interventions    Readmission Risk Interventions Readmission Risk Prevention Plan 02/05/2020  Transportation Screening Complete  PCP or Specialist Appt within 3-5 Days Not Complete  Not Complete comments pending medical stability  HRI or [place] Complete   Social Work Consult for [place] Planning/Counseling Complete  Palliative Care Screening Not Applicable  Medication Review (RN Care Manager) Referral to Pharmacy  Some recent data might be hidden

## 2022-01-20 NOTE — Plan of Care (Signed)
  Problem: Clinical Measurements: Goal: Will remain free from infection Outcome: Progressing Goal: Diagnostic test results will improve Outcome: Progressing Goal: Respiratory complications will improve Outcome: Progressing Goal: Cardiovascular complication will be avoided Outcome: Progressing   Problem: Activity: Goal: Risk for activity intolerance will decrease Outcome: Progressing   

## 2022-01-20 NOTE — Assessment & Plan Note (Signed)
-  hb stable, no bleeding

## 2022-01-20 NOTE — Progress Notes (Signed)
Second attempt to meet with patient. Patient not available. Kent Flowers, M.Min., [phone].

## 2022-01-20 NOTE — Assessment & Plan Note (Signed)
-  cut down significantly but still smoking few cigarettes here and there.

## 2022-01-20 NOTE — NC FL2 (Signed)
[place] LEVEL OF CARE SCREENING TOOL     IDENTIFICATION  Patient Name: Kent Flowers Birthdate: 10/15/1946 Sex: male Admission Date (Current Location): 01/18/2022  [facility] and [place] Number:  Herbalist and Address:  [facility], [place] [address], [place], [place] [address]      Provider Number: [id number]  Attending Physician Name and Address:  Caren Griffins, MD  Relative Name and Phone Number:       Current Level of Care: Hospital Recommended Level of Care: [place] Prior Approval Number:    Date Approved/Denied:   PASRR Number: [id number]  Discharge Plan: SNF    Current Diagnoses: Patient Active Problem List   Diagnosis Date Noted   Recurrent falls 01/20/2022   Tobacco use 01/20/2022   Alcohol use 01/20/2022   Stage 3a chronic kidney disease (CKD) ([place]) 01/20/2022   Anemia of chronic disease 01/20/2022   Dens fracture ([place]) 01/19/2022   Distal phalanx or phalanges, closed fracture 01/19/2022   Lower extremity edema 01/19/2022   COPD (chronic obstructive pulmonary disease) ([place]) 01/19/2022   Common bile duct stone 11/03/2020   Hyperglycemia 09/01/2020   Diskitis 05/10/2020   Dizziness 05/10/2020   Urinary incontinence 03/24/2020   Fecal incontinence 03/24/2020   Epidural abscess    Sepsis ([place]) 02/04/2020   Leg swelling 02/04/2020   Vertebral osteophyte 02/04/2020   Vertebral osteomyelitis ([place]) 02/04/2020   Pulmonary mass 01/22/2020   PICC (peripherally inserted central catheter) in place 01/22/2020   Body mass index (BMI) 27.0-27.9, adult 01/16/2020   Elevated blood-pressure reading, without diagnosis of hypertension 01/16/2020   Paresthesia of right upper extremity 12/18/2019   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/2019   Status post laminectomy 12/18/2019   Impacted cerumen of both ears 10/07/2019   Insomnia 09/22/2019   Neck pain 11/04/2017   Other social stressor  07/19/2016   COPD with acute bronchitis ([place]) 10/04/2014   Hepatitis, unspecified 03/13/2014   Chronic low back pain    Knee pain, bilateral    Right knee DJD    Hepatitis 11/03/2013   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   Aortic calcification ([place]) 11/03/2013   Coronary atherosclerosis of native coronary artery 11/03/2013   Penile neoplasm    Ascending aortic aneurysm    Lung nodule 05/23/2012   COPD mixed type ([place]) 02/24/2012    Orientation RESPIRATION BLADDER Height & Weight     Self, Time, Situation, Place  Normal Incontinent Weight:   Height:     BEHAVIORAL SYMPTOMS/MOOD NEUROLOGICAL BOWEL NUTRITION STATUS      Continent Diet (see DC summary)  AMBULATORY STATUS COMMUNICATION OF NEEDS Skin   Limited Assist Verbally Skin abrasions                       Personal Care Assistance Level of Assistance  Bathing, Feeding, Dressing Bathing Assistance: Limited assistance Feeding assistance: Limited assistance Dressing Assistance: Limited assistance     Functional Limitations Info  Sight Sight Info: Impaired (wears glasses)        SPECIAL CARE FACTORS FREQUENCY  PT (By licensed PT), OT (By licensed OT)     PT Frequency: 5x/wk OT Frequency: 5x/wk            Contractures Contractures Info: Not present    Additional Factors Info  Code Status, Allergies Code Status Info: Partial Allergies Info: Ceclor (Cefaclor), Sulfa Antibiotics  Current Medications (01/20/2022):  This is the current hospital active medication list Current Facility-Administered Medications  Medication Dose Route Frequency Provider Last Rate Last Admin   acetaminophen (TYLENOL) tablet 650 mg  650 mg Oral Q6H PRN Shela Leff, MD       Or   acetaminophen (TYLENOL) suppository 650 mg  650 mg Rectal Q6H PRN Shela Leff, MD       albuterol (VENTOLIN HFA) 108 (90 Base) MCG/ACT inhaler 2 puff  2 puff Inhalation Q4H PRN Rise Patience, MD       doxycycline  (VIBRA-TABS) tablet 100 mg  100 mg Oral Q12H Gherghe, Vella Redhead, MD       mometasone-formoterol (DULERA) 200-5 MCG/ACT inhaler 2 puff  2 puff Inhalation BID Shela Leff, MD       morphine (PF) 2 MG/ML injection 1 mg  1 mg Intravenous Q4H PRN Shela Leff, MD   1 mg at 01/20/22 1227   oxyCODONE (Oxy IR/ROXICODONE) immediate release tablet 15 mg  15 mg Oral Q4H PRN Caren Griffins, MD   15 mg at 01/20/22 1227     Discharge Medications: Please see discharge summary for a list of discharge medications.  Relevant Imaging Results:  Relevant Lab Results:   Additional Information SS#: [id number]  Geralynn Ochs, LCSW

## 2022-01-20 NOTE — Progress Notes (Signed)
Physical Therapy Treatment Patient Details Name: Kent Flowers MRN: [id number] DOB: [birth year] Today's Date: 01/20/2022   History of Present Illness Kent Flowers is a 76 y.o. male admitted 2/1 presents to the ED today via EMS complaining of severe neck pain and right index finger pain after a mechanical fall yesterday.  CT C-spine showing type II dens fracture with mild posterior angulation of the proximal fragment.  Pt had right hip fracture in Dec 2022 and had only been home 10 days prior to this fall.  He went to [facility] per daughter. PMH:  COPD, right apical lung mass, history of penile squamous cell carcinoma, BPH, chronic low back pain, osteoarthritis of knees, ectatic thoracic aorta, MSSA bacteremia secondary to thoracic epidural abscess in January 2021, cervical thoracic discitis/osteomyelitis and February 2021, CBD stone status post ERCP at [facility] in April 2022.    PT Comments    Making slower progress toward goals.  Limited by pain and anxiety.  Emphasis on transitions to sitting, scooting, sit to stand safety and stability with progression of gait stability with the RW.   Recommendations for follow up therapy are one component of a multi-disciplinary discharge planning process, led by the attending physician.  Recommendations may be updated based on patient status, additional functional criteria and insurance authorization.  Follow Up Recommendations  Skilled nursing-short term rehab (<3 hours/day)     Assistance Recommended at Discharge Frequent or constant Supervision/Assistance  Patient can return home with the following Two people to help with walking and/or transfers;A lot of help with bathing/dressing/bathroom;Help with stairs or ramp for entrance   Equipment Recommendations  Other (comment)    Recommendations for Other Services Rehab consult     Precautions / Restrictions Precautions Precautions: Fall;Cervical Precaution Booklet Issued: Yes  (comment) Precaution Comments: reviewed with pt Required Braces or Orthoses: Cervical Brace;Splint/Cast Cervical Brace: At all times;Hard collar Splint/Cast: index finger right hand splint     Mobility  Bed Mobility Overal bed mobility: Needs Assistance   Rolling: Mod assist Sidelying to sit: Mod assist, +2 for physical assistance       General bed mobility comments: pt unable to tolerate lowered HOB.  Cues for sequencing and assist throughout    Transfers Overall transfer level: Needs assistance   Transfers: Sit to/from Stand (x4) Sit to Stand: Mod assist, +2 physical assistance           General transfer comment: cues for hand placement and assist forward and boost.    Ambulation/Gait Ambulation/Gait assistance: Mod assist, +2 safety/equipment Gait Distance (Feet): 5 Feet Assistive device: Rolling walker (2 wheels) Gait Pattern/deviations: Step-through pattern   Gait velocity interpretation: <1.31 ft/sec, indicative of household ambulator   General Gait Details: short  very slow, guarded steps with stability assist and assist with maneuvering the RW   Stairs             Wheelchair Mobility    Modified Rankin (Stroke Patients Only)       Balance Overall balance assessment: Needs assistance Sitting-balance support: No upper extremity supported, Feet supported Sitting balance-Leahy Scale: Fair Sitting balance - Comments: managed static balance at EOB with UE's or externa support   Standing balance support: Bilateral upper extremity supported, During functional activity Standing balance-Leahy Scale: Poor Standing balance comment: reliant on AD and externa support                            Cognition Arousal/Alertness:  Awake/alert Behavior During Therapy: WFL for tasks assessed/performed Overall Cognitive Status: Impaired/Different from baseline Area of Impairment: Following commands, Safety/judgement, Awareness, Problem solving,  Memory                     Memory: Decreased short-term memory Following Commands: Follows one step commands consistently, Follows one step commands with increased time Safety/Judgement: Decreased awareness of safety, Decreased awareness of deficits Awareness: Emergent Problem Solving: Slow processing, Decreased initiation, Difficulty sequencing, Requires verbal cues, Requires tactile cues          Exercises      General Comments        Pertinent Vitals/Pain Pain Assessment Pain Assessment: 0-10 Pain Score: 6  Pain Location: neck Pain Descriptors / Indicators: Aching, Discomfort, Grimacing, Guarding Pain Intervention(s): Monitored during session, Repositioned, Patient requesting pain meds-RN notified    Home Living                          Prior Function            PT Goals (current goals can now be found in the care plan section) Acute Rehab PT Goals Patient Stated Goal: to go to rehab PT Goal Formulation: With patient/family Time For Goal Achievement: 02/02/22 Potential to Achieve Goals: Good Progress towards PT goals: Progressing toward goals    Frequency    Min 4X/week      PT Plan Current plan remains appropriate    Co-evaluation              AM-PAC PT "6 Clicks" Mobility   Outcome Measure  Help needed turning from your back to your side while in a flat bed without using bedrails?: A Lot Help needed moving from lying on your back to sitting on the side of a flat bed without using bedrails?: A Lot Help needed moving to and from a bed to a chair (including a wheelchair)?: A Little Help needed standing up from a chair using your arms (e.g., wheelchair or bedside chair)?: Total Help needed to walk in hospital room?: Total Help needed climbing 3-5 steps with a railing? : Total 6 Click Score: 10    End of Session Equipment Utilized During Treatment: Gait belt Activity Tolerance: Patient limited by fatigue;Other  (comment);Patient limited by pain (and anxiety) Patient left: in chair;with call bell/phone within reach;with chair alarm set Nurse Communication: Mobility status PT Visit Diagnosis: Other abnormalities of gait and mobility (R26.89);Unsteadiness on feet (R26.81);Muscle weakness (generalized) (M62.81);Pain Pain - part of body:  (neck leg)     Time: [id number] PT Time Calculation (min) (ACUTE ONLY): 25 min  Charges:  $Gait Training: 8-22 mins $Therapeutic Activity: 8-22 mins                     01/20/2022  Ginger Carne., PT Acute Rehabilitation Services [phone]  (pager) [phone]  (office)   Tessie Fass Kent Flowers 01/20/2022, 2:31 PM

## 2022-01-20 NOTE — Progress Notes (Signed)
Report given to Alandra at [facility].

## 2022-01-20 NOTE — Discharge Summary (Signed)
Physician Discharge Summary  [name] QPY:[id number] DOB: 29-Dec-1945 DOA: 01/18/2022  PCP: Carylon Perches, NP  Admit date: 01/18/2022 Discharge date: 01/20/2022  Admitted From: home Disposition:  SNF  Recommendations for Outpatient Follow-up:  Follow up with Dr Zada Finders in 1 week Follow up with Dr Lenon Curt in 1 week  [place]: none Equipment/Devices: none  Discharge Condition: stable CODE STATUS: Full code Diet recommendation: regular  HPI: Per admitting MD, Kent Flowers is a 76 y.o. male with medical history significant of COPD, right apical lung mass, history of penile squamous cell carcinoma, BPH, chronic low back pain, osteoarthritis of knees, ectatic thoracic aorta, MSSA bacteremia secondary to thoracic epidural abscess in January 2021, cervical thoracic discitis/osteomyelitis and February 2021, CBD stone status post ERCP at [facility] in April 2022.  He presents to the ED today via EMS complaining of severe neck pain and right index finger pain after a mechanical fall yesterday.  In the ED, vital signs stable.  Labs showing WBC 8.1, hemoglobin 10.8 (at baseline), platelet count 336k.  Sodium 138, potassium 3.5, chloride 105, bicarb 26, BUN 16, creatinine 1.2 (stable), glucose 108.  LFTs normal.  COVID and influenza PCR negative.  Blood culture x2 pending.  Lactic acid pending.  CT C-spine showing type II dens fracture with mild posterior angulation of the proximal fragment.  Neurosurgery consulted - recommended c-collar at all times and outpatient neurosurgery follow-up.  X-ray of right index finger showing minimally displaced intra-articular fracture involving the ulnar aspect of the base of the first distal phalanx.  Finger splint ordered.  CT head negative for acute finding.  Chest x-ray showing mild central vascular congestion and no acute airspace disease. Patient was given fentanyl and doxycycline. History provided by the patient and his daughter at bedside.   Yesterday he was sitting in his recliner and wanted to get up.  As he was trying to reach his walker which was at the site of the recliner, he lost his balance and fell on the floor.  He did not lose consciousness.  Since the fall, he is having severe pain in his neck and also pain in his right index finger.  Patient denies any preceding lightheadedness/dizziness, chest pain, shortness of breath, or palpitations.  Denies fevers, cough, abdominal pain, vomiting, or diarrhea.  Per daughter, patient had spinal infection 2 years ago which left him with right upper extremity weakness.  He had a fall 6 weeks ago and was admitted to an outside hospital for right hip fracture.  He was discharged to rehab and returned home about 10 days ago.  Hospital Course / Discharge diagnoses: * Dens fracture ([place])- (present on admission) -patient had a mechanical fall a day prior to admission and complaining of severe neck pain. Per neurosurgery, c-collar and outpatient follow-up.  Continue pain control.  Outpatient follow-up with Dr. Venetia Constable  Anemia of chronic disease -hb stable, no bleeding  Stage 3a chronic kidney disease (CKD) (HCC) -Baseline creatinine 1.2-1.3, currently at baseline  Alcohol use -used to drink more in the past but not anymore.  Monitor  Tobacco use -cut down significantly but still smoking few cigarettes here and there.  Recurrent falls -needs SNF  COPD (chronic obstructive pulmonary disease) ([place]) -Stable.  No signs of acute exacerbation.  Lower extremity edema- (present on admission) -On exam, right lower extremity erythematous, warm to touch, and swollen. DVT US negative.  Has been placed on doxycycline with improvement.  Continue for 2 additional days.  Distal phalanx  or phalanges, closed fracture- (present on admission) -On admission, EDP discussed with Dr. Lenon Curt from hand surgery, recommended continuing finger splint and outpatient follow-up in a week.  History of vertebral  osteomyelitis, epidural abscess-completed treatment   Recent hip fracture, persistent deconditioning-just was in rehab, but upon going home had recurrent falls.  Suspect he will need rehab again.  Bilateral knee pain-x-rays unremarkable  Sepsis ruled out   Discharge Instructions   Allergies as of 01/20/2022       Reactions   Ceclor [cefaclor] Rash   Sulfa Antibiotics Rash, Other (See Comments)   SEVERE RASH- childhood allergy        Medication List     TAKE these medications    albuterol 108 (90 Base) MCG/ACT inhaler Commonly known as: ProAir HFA INHALE 2 PUFFS INTO THE LUNGS EVERY 6 HOURS AS NEEDED FOR WHEEZING ORSHORTNESS OF BREATH What changed:  how much to take how to take this when to take this reasons to take this additional instructions   doxycycline 100 MG tablet Commonly known as: VIBRA-TABS Take 1 tablet (100 mg total) by mouth every 12 (twelve) hours for 2 days.   fluticasone-salmeterol 250-50 MCG/ACT Aepb Commonly known as: ADVAIR Inhale 1 puff into the lungs every 12 (twelve) hours.   gabapentin 300 MG capsule Commonly known as: NEURONTIN Take 1 capsule (300 mg total) by mouth 3 (three) times daily. What changed: how much to take   oxyCODONE 15 MG immediate release tablet Commonly known as: ROXICODONE Take 1 tablet (15 mg total) by mouth 5 (five) times daily as needed for pain. What changed: Another medication with the same name was removed. Continue taking this medication, and follow the directions you see here.   testosterone cypionate 200 MG/ML injection Commonly known as: DEPOTESTOSTERONE CYPIONATE Inject 180 mg into the muscle every 14 (fourteen) days.        Follow-up Information     [name], MD Follow up in 1 week(s).   Specialty: Neurosurgery Contact information: [address] [place] [address] [phone]         Pa, Allen Follow up in 1 week(s).   Contact  information: [address] [place] [address] [phone]                 Consultations: none  Procedures/Studies:  DG Knee 1-2 Views Left  Result Date: 01/20/2022 CLINICAL DATA:  Left knee pain EXAM: LEFT KNEE - 1-2 VIEW COMPARISON:  None. FINDINGS: No acute fracture or dislocation identified. Severe narrowing of the medial femorotibial compartment and mild narrowing of the patellofemoral joint. Associated subchondral sclerosis and marginal osteophytes. Small joint effusion. IMPRESSION: Degenerative changes and small joint effusion. Electronically Signed   By: Ofilia Neas M.D.   On: 01/20/2022 09:19   DG Knee 1-2 Views Right  Result Date: 01/20/2022 CLINICAL DATA:  Right knee pain EXAM: RIGHT KNEE - 1-2 VIEW COMPARISON:  None. FINDINGS: No acute fracture or dislocation identified. Moderate narrowing of the medial femorotibial compartment and patellofemoral joint. Associated subchondral sclerosis and marginal osteophytes. Small joint effusion. IMPRESSION: Degenerative changes and small joint effusion. Electronically Signed   By: Ofilia Neas M.D.   On: 01/20/2022 09:21   CT HEAD WO CONTRAST (5MM)  Result Date: 01/18/2022 CLINICAL DATA:  [place] yesterday. EXAM: CT HEAD WITHOUT CONTRAST CT CERVICAL SPINE WITHOUT CONTRAST TECHNIQUE: Multidetector CT imaging of the head and cervical spine was performed following the standard protocol without intravenous  contrast. Multiplanar CT image reconstructions of the cervical spine were also generated. RADIATION DOSE REDUCTION: This exam was performed according to the departmental dose-optimization program which includes automated exposure control, adjustment of the mA and/or kV according to patient size and/or use of iterative reconstruction technique. COMPARISON:  Cervical spine MRI dated 09/16/2020. Head CT dated 12/18/2019. FINDINGS: CT HEAD FINDINGS Brain: No significant change in mild-to-moderate enlargement of the ventricles  and subarachnoid spaces. Mild-to-moderate patchy white matter low density in both cerebral hemispheres without significant change. No intracranial hemorrhage, mass lesion or CT evidence of acute infarction. Vascular: No hyperdense vessel or unexpected calcification. Skull: Normal. Negative for fracture or focal lesion. Sinuses/Orbits: Unremarkable orbits. Mild retained secretions in both maxillary sinuses. Other: Deviation of the mid to anterior portion of the nasal septum to the right. CT CERVICAL SPINE FINDINGS Alignment: Normal. Skull base and vertebrae: Interval oblique fracture through the base of the dens with mild anterior distraction and mild posterior angulation of the proximal fragment. Soft tissues and spinal canal: No prevertebral fluid or swelling. No visible canal hematoma. Disc levels: Multilevel degenerative changes. Solid vertebral body fusion at the C7-T1 level. Multilevel degenerative changes. Changes of DISH anteriorly. Upper chest: Mild bilateral centrilobular bullous changes. Other: Dense bilateral carotid artery calcifications. IMPRESSION: 1. Type 2 dens fracture with mild posterior angulation of the proximal fragment. 2. No skull fracture or intracranial hemorrhage. 3. Mild-to-moderate cerebral and cerebellar atrophy and chronic small vessel white matter ischemic changes. 4. Cervical spine degenerative changes, changes of DISH and solid bone fusion involving the C7 and T1 vertebral bodies. Critical Value/emergent results were called by telephone at the time of interpretation on 01/18/2022 at 7:46 pm to provider Carlisle Cater, who verbally acknowledged these results. Electronically Signed   By: Claudie Revering M.D.   On: 01/18/2022 19:47   CT Cervical Spine Wo Contrast  Result Date: 01/18/2022 CLINICAL DATA:  [place] yesterday. EXAM: CT HEAD WITHOUT CONTRAST CT CERVICAL SPINE WITHOUT CONTRAST TECHNIQUE: Multidetector CT imaging of the head and cervical spine was performed following the standard  protocol without intravenous contrast. Multiplanar CT image reconstructions of the cervical spine were also generated. RADIATION DOSE REDUCTION: This exam was performed according to the departmental dose-optimization program which includes automated exposure control, adjustment of the mA and/or kV according to patient size and/or use of iterative reconstruction technique. COMPARISON:  Cervical spine MRI dated 09/16/2020. Head CT dated 12/18/2019. FINDINGS: CT HEAD FINDINGS Brain: No significant change in mild-to-moderate enlargement of the ventricles and subarachnoid spaces. Mild-to-moderate patchy white matter low density in both cerebral hemispheres without significant change. No intracranial hemorrhage, mass lesion or CT evidence of acute infarction. Vascular: No hyperdense vessel or unexpected calcification. Skull: Normal. Negative for fracture or focal lesion. Sinuses/Orbits: Unremarkable orbits. Mild retained secretions in both maxillary sinuses. Other: Deviation of the mid to anterior portion of the nasal septum to the right. CT CERVICAL SPINE FINDINGS Alignment: Normal. Skull base and vertebrae: Interval oblique fracture through the base of the dens with mild anterior distraction and mild posterior angulation of the proximal fragment. Soft tissues and spinal canal: No prevertebral fluid or swelling. No visible canal hematoma. Disc levels: Multilevel degenerative changes. Solid vertebral body fusion at the C7-T1 level. Multilevel degenerative changes. Changes of DISH anteriorly. Upper chest: Mild bilateral centrilobular bullous changes. Other: Dense bilateral carotid artery calcifications. IMPRESSION: 1. Type 2 dens fracture with mild posterior angulation of the proximal fragment. 2. No skull fracture or intracranial hemorrhage. 3. Mild-to-moderate cerebral and cerebellar atrophy and  chronic small vessel white matter ischemic changes. 4. Cervical spine degenerative changes, changes of DISH and solid bone  fusion involving the C7 and T1 vertebral bodies. Critical Value/emergent results were called by telephone at the time of interpretation on 01/18/2022 at 7:46 pm to provider Carlisle Cater, who verbally acknowledged these results. Electronically Signed   By: Claudie Revering M.D.   On: 01/18/2022 19:47   DG Chest Portable 1 View  Result Date: 01/18/2022 CLINICAL DATA:  [place] yesterday EXAM: PORTABLE CHEST 1 VIEW COMPARISON:  09/27/2021 FINDINGS: Single frontal view of the chest demonstrates an unremarkable cardiac silhouette allowing for portable AP technique. There is mild central vascular congestion without airspace disease, effusion, or pneumothorax. No acute bony abnormalities. IMPRESSION: 1. Mild central vascular congestion.  No acute airspace disease. Electronically Signed   By: Randa Ngo M.D.   On: 01/18/2022 21:19   DG Hand Complete Right  Result Date: 01/18/2022 CLINICAL DATA:  Fall. EXAM: RIGHT HAND - COMPLETE 3+ VIEW COMPARISON:  None. FINDINGS: There is no evidence of fracture or dislocation. There are mild degenerative changes at the first carpometacarpal joint and throughout the interphalangeal joints. Soft tissues are unremarkable. IMPRESSION: 1. No acute bony abnormality. 2. Mild degenerative changes of the. Electronically Signed   By: Ronney Asters M.D.   On: 01/18/2022 19:18   DG Finger Index Right  Result Date: 01/18/2022 CLINICAL DATA:  [place] yesterday, pain EXAM: RIGHT INDEX FINGER 2+V COMPARISON:  01/18/2022 FINDINGS: Frontal, oblique, lateral views of the right second digit are obtained. There is a minimally displaced intra-articular fracture involving the ulnar aspect of the base of the first distal phalanx. Marginal osteophyte is seen along the medial aspect of the first distal interphalangeal joint. There is distal soft tissue swelling. IMPRESSION: 1. Minimally displaced intra-articular fracture ulnar aspect base of the first distal phalanx. 2. Soft tissue swelling. 3. Osteoarthritis.  Electronically Signed   By: Randa Ngo M.D.   On: 01/18/2022 21:21   VAS [place] LOWER EXTREMITY VENOUS (DVT) (ONLY [facility] & [facility])  Result Date: 01/19/2022  Lower Venous DVT Study Patient Name:  LESHAUN BIEBEL [facility]  Date of Exam:   01/19/2022 Medical Rec #: [id number]         Accession #:    [id number] Date of Birth: 16-Apr-1946         Patient Gender: M Patient Age:   26 years Exam Location:  [facility] Procedure:      VAS [place] LOWER EXTREMITY VENOUS (DVT) Referring Phys: Wandra Feinstein RATHORE --------------------------------------------------------------------------------  Indications: Swelling, erythema, s/p fall.  Comparison Study: 02-04-2020 Prior bilateral lower extremity venous study was                   negative for DVT. Performing Technologist: Darlin Coco RDMS, RVT  Examination Guidelines: A complete evaluation includes B-mode imaging, spectral Doppler, color Doppler, and power Doppler as needed of all accessible portions of each vessel. Bilateral testing is considered an integral part of a complete examination. Limited examinations for reoccurring indications may be performed as noted. The reflux portion of the exam is performed with the patient in reverse Trendelenburg.  +---------+---------------+---------+-----------+----------+--------------+  RIGHT     Compressibility Phasicity Spontaneity Properties Thrombus Aging  +---------+---------------+---------+-----------+----------+--------------+  CFV       Full            Yes       Yes                                    +---------+---------------+---------+-----------+----------+--------------+  SFJ       Full                                                             +---------+---------------+---------+-----------+----------+--------------+  FV Prox   Full                                                             +---------+---------------+---------+-----------+----------+--------------+  FV Mid    Full                                                              +---------+---------------+---------+-----------+----------+--------------+  FV Distal Full                                                             +---------+---------------+---------+-----------+----------+--------------+  PFV       Full                                                             +---------+---------------+---------+-----------+----------+--------------+  POP       Full            Yes       Yes                                    +---------+---------------+---------+-----------+----------+--------------+  PTV       Full                                                             +---------+---------------+---------+-----------+----------+--------------+  PERO      Full                                                             +---------+---------------+---------+-----------+----------+--------------+  Gastroc   Full                                                             +---------+---------------+---------+-----------+----------+--------------+   +---------+---------------+---------+-----------+----------+--------------+  LEFT      Compressibility Phasicity Spontaneity Properties Thrombus Aging  +---------+---------------+---------+-----------+----------+--------------+  CFV       Full            Yes       Yes                                    +---------+---------------+---------+-----------+----------+--------------+  SFJ       Full                                                             +---------+---------------+---------+-----------+----------+--------------+  FV Prox   Full                                                             +---------+---------------+---------+-----------+----------+--------------+  FV Mid    Full                                                             +---------+---------------+---------+-----------+----------+--------------+  FV Distal Full                                                              +---------+---------------+---------+-----------+----------+--------------+  PFV       Full                                                             +---------+---------------+---------+-----------+----------+--------------+  POP       Full            Yes       Yes                                    +---------+---------------+---------+-----------+----------+--------------+  PTV       Full                                                             +---------+---------------+---------+-----------+----------+--------------+  PERO      Full                                                             +---------+---------------+---------+-----------+----------+--------------+  Gastroc   Full                                                             +---------+---------------+---------+-----------+----------+--------------+     Summary: RIGHT: - There is no evidence of deep vein thrombosis in the lower extremity.  - No cystic structure found in the popliteal fossa.  LEFT: - There is no evidence of deep vein thrombosis in the lower extremity.  - Incidental heterogenous area, brightly echogenic structure with acoustic shadowing medial to the bony structures of the knee. Unknown etiology.  *See table(s) above for measurements and observations. Electronically signed by Orlie Pollen on 01/19/2022 at 5:51:04 PM.    Final      Subjective: - no chest pain, shortness of breath, no abdominal pain, nausea or vomiting.   Discharge Exam: BP 126/63 (BP Location: Right Arm)    Pulse 66    Temp 98.6 F (37 C) (Oral)    Resp 16    SpO2 92%   General: Pt is alert, awake, not in acute distress Cardiovascular: RRR, S1/S2 +, no rubs, no gallops Respiratory: CTA bilaterally, no wheezing, no rhonchi Abdominal: Soft, NT, ND, bowel sounds + Extremities: no edema, no cyanosis   The results of significant diagnostics from this hospitalization (including imaging, microbiology, ancillary and laboratory) are listed below for  reference.     Microbiology: Recent Results (from the past 240 hour(s))  Resp Panel by RT-PCR (Flu A&B, Covid) Nasopharyngeal Swab     Status: None   Collection Time: 01/18/22  8:39 PM   Specimen: Nasopharyngeal Swab; Nasopharyngeal(NP) swabs in vial transport medium  Result Value Ref Range Status   SARS Coronavirus 2 by RT PCR NEGATIVE NEGATIVE Final    Comment: (NOTE) SARS-CoV-2 target nucleic acids are NOT DETECTED.  The SARS-CoV-2 RNA is generally detectable in upper respiratory specimens during the acute phase of infection. The lowest concentration of SARS-CoV-2 viral copies this assay can detect is 138 copies/mL. A negative result does not preclude SARS-Cov-2 infection and should not be used as the sole basis for treatment or other patient management decisions. A negative result may occur with  improper specimen collection/handling, submission of specimen other than nasopharyngeal swab, presence of viral mutation(s) within the areas targeted by this assay, and inadequate number of viral copies(<138 copies/mL). A negative result must be combined with clinical observations, patient history, and epidemiological information. The expected result is Negative.  Fact Sheet for Patients:  EntrepreneurPulse.com.au  Fact Sheet for Healthcare Providers:  IncredibleEmployment.be  This test is no t yet approved or cleared by the [place] FDA and  has been authorized for detection and/or diagnosis of SARS-CoV-2 by FDA under an Emergency Use Authorization (EUA). This EUA will remain  in effect (meaning this test can be used) for the duration of the COVID-19 declaration under Section 564(b)(1) of the Act, 21 U.S.C.section 360bbb-3(b)(1), unless the authorization is terminated  or revoked sooner.       Influenza A by PCR NEGATIVE NEGATIVE Final   Influenza B by PCR NEGATIVE NEGATIVE Final    Comment: (NOTE) The Xpert Xpress SARS-CoV-2/FLU/RSV  plus assay is intended as an aid in the diagnosis of influenza from Nasopharyngeal swab specimens and should not be used as a sole basis for treatment. Nasal washings and aspirates  are unacceptable for Xpert Xpress SARS-CoV-2/FLU/RSV testing.  Fact Sheet for Patients: EntrepreneurPulse.com.au  Fact Sheet for Healthcare Providers: IncredibleEmployment.be  This test is not yet approved or cleared by the [place] FDA and has been authorized for detection and/or diagnosis of SARS-CoV-2 by FDA under an Emergency Use Authorization (EUA). This EUA will remain in effect (meaning this test can be used) for the duration of the COVID-19 declaration under Section 564(b)(1) of the Act, 21 U.S.C. section 360bbb-3(b)(1), unless the authorization is terminated or revoked.  Performed at [facility], [place] [address], [place], [place] [address]   Blood culture (routine x 2)     Status: None (Preliminary result)   Collection Time: 01/19/22 12:39 AM   Specimen: BLOOD RIGHT HAND  Result Value Ref Range Status   Specimen Description BLOOD RIGHT HAND  Final   Special Requests   Final    BOTTLES DRAWN AEROBIC AND ANAEROBIC Blood Culture results may not be optimal due to an inadequate volume of blood received in culture bottles   Culture   Final    NO GROWTH 1 DAY Performed at [facility], [place] [address], [place], [place] [address]    Report Status PENDING  Incomplete  Blood culture (routine x 2)     Status: None (Preliminary result)   Collection Time: 01/19/22 12:40 AM   Specimen: BLOOD LEFT HAND  Result Value Ref Range Status   Specimen Description BLOOD LEFT HAND  Final   Special Requests   Final    BOTTLES DRAWN AEROBIC AND ANAEROBIC Blood Culture results may not be optimal due to an inadequate volume of blood received in culture bottles   Culture   Final    NO GROWTH 1 DAY Performed at [facility], [place] [address], [place], [place]  [address]    Report Status PENDING  Incomplete     Labs: Basic Metabolic Panel: Recent Labs  Lab 01/18/22 2034 01/20/22 0212  NA 138 142  K 3.5 3.6  CL 105 106  CO2 26 26  GLUCOSE 108* 103*  BUN 16 17  CREATININE 1.28* 1.36*  CALCIUM 8.5* 8.6*   Liver Function Tests: Recent Labs  Lab 01/18/22 2034 01/20/22 0212  AST 17 13*  ALT 11 11  ALKPHOS 102 101  BILITOT 0.6 0.6  PROT 6.6 6.4*  ALBUMIN 3.0* 2.9*   CBC: Recent Labs  Lab 01/18/22 2034 01/20/22 0212  WBC 8.1 8.1  NEUTROABS 4.6  --   HGB 10.8* 10.3*  HCT 34.2* 33.1*  MCV 92.2 91.7  PLT 336 313   CBG: No results for input(s): GLUCAP in the last 168 hours. Hgb A1c No results for input(s): HGBA1C in the last 72 hours. Lipid Profile No results for input(s): CHOL, HDL, LDLCALC, TRIG, CHOLHDL, LDLDIRECT in the last 72 hours. Thyroid function studies No results for input(s): TSH, T4TOTAL, T3FREE, THYROIDAB in the last 72 hours.  Invalid input(s): FREET3 Urinalysis    Component Value Date/Time   COLORURINE YELLOW 02/04/2020 0151   APPEARANCEUR CLOUDY (A) 02/04/2020 0151   LABSPEC 1.010 02/04/2020 0151   PHURINE 7.0 02/04/2020 0151   GLUCOSEU NEGATIVE 02/04/2020 0151   HGBUR NEGATIVE 02/04/2020 0151   BILIRUBINUR NEGATIVE 02/04/2020 0151   KETONESUR NEGATIVE 02/04/2020 0151   PROTEINUR 100 (A) 02/04/2020 0151   NITRITE NEGATIVE 02/04/2020 0151   LEUKOCYTESUR LARGE (A) 02/04/2020 0151    FURTHER DISCHARGE INSTRUCTIONS:   Get Medicines reviewed and adjusted: Please take all your medications  with you for your next visit with your Primary MD   Laboratory/radiological data: Please request your Primary MD to go over all hospital tests and procedure/radiological results at the follow up, please ask your Primary MD to get all Hospital records sent to his/her office.   In some cases, they will be blood work, cultures and biopsy results pending at the time of your discharge. Please request that your primary  care M.D. goes through all the records of your hospital data and follows up on these results.   Also Note the following: If you experience worsening of your admission symptoms, develop shortness of breath, life threatening emergency, suicidal or homicidal thoughts you must seek medical attention immediately by calling 911 or calling your MD immediately  if symptoms less severe.   You must read complete instructions/literature along with all the possible adverse reactions/side effects for all the Medicines you take and that have been prescribed to you. Take any new Medicines after you have completely understood and accpet all the possible adverse reactions/side effects.    Do not drive when taking Pain medications or sleeping medications (Benzodaizepines)   Do not take more than prescribed Pain, Sleep and Anxiety Medications. It is not advisable to combine anxiety,sleep and pain medications without talking with your primary care practitioner   Special Instructions: If you have smoked or chewed Tobacco  in the last 2 yrs please stop smoking, stop any regular Alcohol  and or any Recreational drug use.   Wear Seat belts while driving.   Please note: You were cared for by a hospitalist during your hospital stay. Once you are discharged, your primary care physician will handle any further medical issues. Please note that NO REFILLS for any discharge medications will be authorized once you are discharged, as it is imperative that you return to your primary care physician (or establish a relationship with a primary care physician if you do not have one) for your post hospital discharge needs so that they can reassess your need for medications and monitor your lab values.  Time coordinating discharge: 40 minutes  SIGNED:  Marzetta Board, MD, PhD 01/20/2022, 12:35 PM

## 2022-01-20 NOTE — Progress Notes (Signed)
Daughter requested Chaplain visit with patient to initiate conversation relating to recent death of patient's wife. Mr. Nabers welcomed Chaplain stated that he in deed welcomes the visit, though our  consultation was very brief upon arrival of breakfast. Patient began to speak of his loss of wife of 34 years while on family vacation to [facility]. Patient requested that I come back to continue conversation. [place] [phone].

## 2022-01-20 NOTE — Hospital Course (Signed)
76 year old male with history of COPD, right apical lung mass, penile squamous cell carcinoma, BPH, chronic low back pain, osteoarthritis, MSSA bacteremia in January 2021, discitis/osteomyelitis, came into the hospital with neck and finger pain after a fall at home.  Of note, he was recently hospitalized couple months ago for fall and hip fracture, and was discharged to an SNF.  He was home for the past 10 days but continued to be weak and had 2 falls, one of them resulting in his hospital presentation.  Lactic acid pending.  CT C-spine showing type II dens fracture with mild posterior angulation of the proximal fragment.  Neurosurgery consulted - recommended c-collar at all times and outpatient neurosurgery follow-up.  X-ray of right index finger showing minimally displaced intra-articular fracture involving the ulnar aspect of the base of the first distal phalanx.  Finger splint ordered per orthopedic surgery

## 2022-01-20 NOTE — Assessment & Plan Note (Signed)
-  needs SNF

## 2022-01-20 NOTE — TOC Transition Note (Signed)
Transition of Care [facility]) - CM/SW Discharge Note   Patient Details  Name: Kent Flowers MRN: [id number] Date of Birth: 1946/01/16  [facility] CM/SW Contact:  Geralynn Ochs, LCSW Phone Number: 01/20/2022, 1:37 PM   Clinical Narrative:   CSW spoke with patient and daughter about bed offers, family chose [facility] for SNF. CSW confirmed bed availability. CSW initiated insurance authorization and received approval for patient to admit. [facility] arranged for transport.  Nurse to call report to [phone], Room 120.    Final next level of care: Skilled Nursing Facility Barriers to Discharge: Barriers Resolved   Patient Goals and CMS Choice Patient states their goals for this hospitalization and ongoing recovery are:: to get better CMS Medicare.gov Compare Post Acute Care list provided to:: Patient Choice offered to / list presented to : Patient, Adult Children  Discharge Placement              Patient chooses bed at: [facility] Patient to be transferred to facility by: [place] Name of family member notified: Madelynn Patient and family notified of of transfer: 01/20/22  Discharge Plan and Services     Post Acute Care Choice: [place]                               Social Determinants of Health (SDOH) Interventions     Readmission Risk Interventions Readmission Risk Prevention Plan 02/05/2020  Transportation Screening Complete  PCP or Specialist Appt within 3-5 Days Not Complete  Not Complete comments pending medical stability  HRI or [place] Complete  Social Work Consult for [place] Planning/Counseling Complete  Palliative Care Screening Not Applicable  Medication Review Press photographer) Referral to Pharmacy  Some recent data might be hidden

## 2022-01-20 NOTE — Assessment & Plan Note (Signed)
-  Baseline creatinine 1.2-1.3, currently at baseline

## 2022-01-20 NOTE — Plan of Care (Signed)

## 2022-01-24 LAB — CULTURE, BLOOD (ROUTINE X 2)
Culture: NO GROWTH
Culture: NO GROWTH

## 2022-03-23 ENCOUNTER — Ambulatory Visit: Payer: Medicare Other | Admitting: Neurology

## 2022-03-23 ENCOUNTER — Encounter: Payer: Self-pay | Admitting: Neurology

## 2022-03-23 VITALS — BP 126/77 | HR 82 | Ht 67.0 in | Wt 144.5 lb

## 2022-03-23 DIAGNOSIS — G3184 Mild cognitive impairment, so stated: Secondary | ICD-10-CM

## 2022-03-23 DIAGNOSIS — G062 Extradural and subdural abscess, unspecified: Secondary | ICD-10-CM | POA: Diagnosis not present

## 2022-03-23 DIAGNOSIS — Z8781 Personal history of (healed) traumatic fracture: Secondary | ICD-10-CM

## 2022-03-23 DIAGNOSIS — R4189 Other symptoms and signs involving cognitive functions and awareness: Secondary | ICD-10-CM | POA: Insufficient documentation

## 2022-03-23 MED ORDER — DONEPEZIL HCL 10 MG PO TABS: 10.0000 mg | ORAL_TABLET | Freq: Every day | ORAL | 11 refills | Status: DC

## 2022-03-23 NOTE — Patient Instructions (Signed)
Start with Aricept 10 mg 1/2 tab nightly for 4 weeks then increase to full tab nightly  ?Dementia labs  ?Continue your other medications  ?Follow up in a year  ? ? ? ?There are well-accepted and sensible ways to reduce risk for Alzheimers disease and other degenerative brain disorders . ? ?Exercise Daily Walk A daily 20 minute walk should be part of your routine. Disease related apathy can be a significant roadblock to exercise and the only way to overcome this is to make it a daily routine and perhaps have a reward at the end (something your loved one loves to eat or drink perhaps) or a personal trainer coming to the home can also be very useful. Most importantly, the patient is much more likely to exercise if the caregiver / spouse does it with him/her. In general a structured, repetitive schedule is best. ? ?General Health: Any diseases which effect your body will effect your brain such as a pneumonia, urinary infection, blood clot, heart attack or stroke. Keep contact with your primary care doctor for regular follow ups. ? ?Sleep. A good nights sleep is healthy for the brain. Seven hours is recommended. If you have insomnia or poor sleep habits we can give you some instructions. If you have sleep apnea wear your mask. ? ?Diet: Eating a heart healthy diet is also a good idea; fish and poultry instead of red meat, nuts (mostly non-peanuts), vegetables, fruits, olive oil or canola oil (instead of butter), minimal salt (use other spices to flavor foods), whole grain rice, bread, cereal and pasta and wine in moderation.Research is now showing that the MIND diet, which is a combination of The Mediterranean diet and the DASH diet, is beneficial for cognitive processing and longevity. Information about this diet can be found in The MIND Diet, a book by Doyne Keel, MS, RDN, and online at NotebookDistributors.si ? ?Finances, Power of Producer, television/film/video Directives: You should consider putting  legal safeguards in place with regard to financial and medical decision making. While the spouse always has power of attorney for medical and financial issues in the absence of any form, you should consider what you want in case the spouse / caregiver is no longer around or capable of making decisions.  ? ? ? ?Heart-head connection ? ?New research shows there are things we can do to reduce the risk of mild cognitive impairment and dementia. ? ?Several conditions known to increase the risk of cardiovascular disease -- such as high blood pressure, diabetes and high cholesterol -- also increase the risk of developing Alzheimer's. Some autopsy studies show that as many as 53 percent of individuals with Alzheimer's disease also have cardiovascular disease. ? ?A longstanding question is why some people develop hallmark Alzheimer's plaques and tangles but do not develop the symptoms of Alzheimer's. Vascular disease may help researchers eventually find an answer. Some autopsy studies suggest that plaques and tangles may be present in the brain without causing symptoms of cognitive decline unless the brain also shows evidence of vascular disease. More research is needed to better understand the link between vascular health and Alzheimer's. ? ?Physical exercise and diet ?Regular physical exercise may be a beneficial strategy to lower the risk of Alzheimer's and vascular dementia. Exercise may directly benefit brain cells by increasing blood and oxygen flow in the brain. Because of its known cardiovascular benefits, a medically approved exercise program is a valuable part of any overall wellness plan. ? ?Current evidence suggests that heart-healthy eating may  also help protect the brain. Heart-healthy eating includes limiting the intake of sugar and saturated fats and making sure to eat plenty of fruits, vegetables, and whole grains. No one diet is best. Two diets that have been studied and may be beneficial are the DASH  (Dietary Approaches to Stop Hypertension) diet and the Mediterranean diet. The DASH diet emphasizes vegetables, fruits and fat-free or low-fat dairy products; includes whole grains, fish, poultry, beans, seeds, nuts and vegetable oils; and limits sodium, sweets, sugary beverages and red meats. A Mediterranean diet includes relatively little red meat and emphasizes whole grains, fruits and vegetables, fish and shellfish, and nuts, olive oil and other healthy fats. ? ?Social connections and intellectual activity ?A number of studies indicate that maintaining strong social connections and keeping mentally active as we age might lower the risk of cognitive decline and Alzheimer's. Experts are not certain about the reason for this association. It may be due to direct mechanisms through which social and mental stimulation strengthen connections between nerve cells in the brain. ? ?Head trauma ?There appears to be a strong link between future risk of Alzheimer's and serious head trauma, especially when injury involves loss of consciousness. You can help reduce your risk of Alzheimer's by protecting your head. ? Wear a seat belt ? Use a helmet when participating in sports ? "Fall-proof" your home ?  ?What you can do now ?While research is not yet conclusive, certain lifestyle choices, such as physical activity and diet, may help support brain health and prevent Alzheimer's. Many of these lifestyle changes have been shown to lower the risk of other diseases, like heart disease and diabetes, which have been linked to Alzheimer's. With few drawbacks and plenty of known benefits, healthy lifestyle choices can improve your health and possibly protect your brain. ? ?Learn more about brain health. ?You can help increase our knowledge by considering participation in a clinical study. Our free clinical trial matching services, TrialMatch?, can help you find clinical trials in your area that are seeking volunteers. ? ?  ?

## 2022-03-23 NOTE — Progress Notes (Signed)
? ?[facility] ? ?PATIENT: Kent Flowers ?DOB: 07-15-1946 ? ?REQUESTING CLINICIAN: [name], MD ?HISTORY FROM: Patient and daughter  ?REASON FOR VISIT: Memory decline ? ? ?HISTORICAL ? ?CHIEF COMPLAINT:  ?Chief Complaint  ?Patient presents with  ? New Patient (Initial Visit)  ?  Rm 12. Accompanied by daughter. ?NP/Paper/[facility]/Thomas Ostergard MD/cognitive impairment.  ? ? 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  Patient reports that his memory is not what it used to be, he had difficulty with short term memory and this has been going on for the past couple years and worsened in the last year.  He gets easily distracted, he is forgetful, he can call his daughter multiple times during the day for the same issue.  And he was also noted to repeat himself.  He has disrupted sleep cycle, mostly wakes up early in the morning and unable to fall back asleep, had fallen multiple times due to balance issue.  Due to this fall he did have 2 dental fractures also have a right hip fracture in December of last year.  He is currently using a walker ? ?He previously worked as an Arboriculturist, Manufacturing engineer but since the surgery has not been to work.  ? ?TBI:   No past history of TBI ?Stroke:   no past history of stroke ?Seizures:   no past history of seizures ?Sleep:   no history of sleep apnea.  ?Mood:   patient denies anxiety and depression ? ?Functional status: independent in all ADLs ?Patient lives with daughter [name]   ?Cooking: yes  ?Cleaning: Minor  ?Shopping: Yes  ?Bathing: Patient  ?Toileting: Patient   ?Driving: No, stopped driving a year ago ?Bills: Couldn't remember to pay bills on time  ? ?Ever left the stove on by accident?:  No  ?Forget how to use items around the house?: No ?Getting lost going to  familiar places?: No  ?Forgetting loved ones names?: No  ?Word finding difficulty? Yes  ?Sleep: Difficulty with sleep  ? ? ?OTHER MEDICAL CONDITIONS: COPD, thoracic epidural abscess  ? ? ?REVIEW OF SYSTEMS: Full 14 system review of systems performed and negative with exception of: as noted in the HPI  ? ?ALLERGIES: ?Allergies  ?Allergen Reactions  ? Ceclor [Cefaclor] Rash  ? Sulfa Antibiotics Rash and Other (See Comments)  ?  SEVERE RASH- childhood allergy  ? ? ?HOME MEDICATIONS: ?Outpatient Medications Prior to Visit  ?Medication Sig Dispense Refill  ? albuterol (PROAIR HFA) 108 (90 Base) MCG/ACT inhaler INHALE 2 PUFFS INTO THE LUNGS EVERY 6 HOURS AS NEEDED FOR WHEEZING ORSHORTNESS OF BREATH (Patient taking differently: Inhale 2 puffs into the lungs every 6 (six) hours as needed for shortness of breath or wheezing.) 8.5 g 12  ? fluticasone-salmeterol (ADVAIR) 250-50 MCG/ACT AEPB Inhale 1 puff into the lungs every 12 (twelve) hours. 60 each 11  ? gabapentin (NEURONTIN) 300 MG capsule Take 1 capsule (300 mg total) by mouth 3 (three) times daily. (Patient taking differently: Take 600 mg by mouth 3 (three) times daily.)    ? oxyCODONE (ROXICODONE) 15 MG immediate release tablet Take 1 tablet (15 mg total) by mouth 5 (five) times daily as needed for pain. 15 tablet 0  ? testosterone cypionate (DEPOTESTOSTERONE CYPIONATE) 200 MG/ML injection Inject 180 mg into the muscle every 14 (fourteen) days.     ? ?  No facility-administered medications prior to visit.  ? ? ?PAST MEDICAL HISTORY: ?Past Medical History:  ?Diagnosis Date  ? Benign localized prostatic hyperplasia with lower urinary tract symptoms (LUTS)   ? Chronic low back pain   ? Common bile duct stone 11/03/2020  ? COPD (chronic obstructive pulmonary disease) with emphysema ([place])   ? PULMOLOGIST-- DR Tarri Fuller YOUNG  ? Diskitis 05/10/2020  ? Dizziness 05/10/2020  ? 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 ED (erectile dysfunction)   ? Fecal incontinence 03/24/2020  ? Full dentures    ? Gross hematuria   ? History of squamous cell carcinoma in situ   ? 03-14-2013---  penile high grade squamous intraepithieal carcinoma in situ  s/p  excisional bx   ? Hyperglycemia 09/01/2020  ? Hypogonadism male   ? Knee pain, bilateral   ? INTERMITTENT--  MENISCUS  ? OA (osteoarthritis)   ? KNEES  ? Peyronie's disease   ? Pulmonary nodules followed by dr c. young (pulmologist)  ? LLL and LUL-- per last CT 10-01-2013  stable and previous right lung nodule not seen  ? Thoracic ascending aortic aneurysm ([place])   ? STABLE PER LAST CT 10-01-2013  4CM--  ECTATIC   ? Urethral stricture   ? Urgency of urination   ? Urinary incontinence 03/24/2020  ? 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 Wears glasses   ? ? ?PAST SURGICAL HISTORY: ?Past Surgical History:  ?Procedure Laterality Date  ? CYSTOSCOPY WITH BIOPSY N/A 04/10/2017  ? Procedure: POSSIBLE BLADDER  BIOPSY;  Surgeon: Festus Aloe, MD;  Location: [facility];  Service: Urology;  Laterality: N/A;  ? CYSTOSCOPY WITH URETHRAL DILATATION N/A 04/10/2017  ? Procedure: CYSTOSCOPY WITH BALLOON URETHRALSTRICTURE DILATATION;  Surgeon: Festus Aloe, MD;  Location: [facility];  Service: Urology;  Laterality: N/A;  ? PENILE BIOPSY N/A 03/14/2013  ? Procedure: PENILE BIOPSY;  Surgeon: Fredricka Bonine, MD;  Location: [facility];  Service: Urology;  Laterality: N/A;  ? REATTACHMENT LEFT INDEX FINGER  1988  ? SHOULDER ARTHROSCOPY WITH ROTATOR CUFF REPAIR AND SUBACROMIAL DECOMPRESSION Left 2004  ? THORACIC LAMINECTOMY FOR EPIDURAL ABSCESS N/A 12/18/2019  ? Procedure: THORACIC LAMINECTOMY FOR EPIDURAL ABSCESS Thoracic Two, Thoracic Three;  Surgeon: [name], MD;  Location: [place];  Service: Neurosurgery;  Laterality: N/A;  THORACIC LAMINECTOMY FOR EPIDURAL ABSCESS Thoracic Two, Thoracic Three  ? TONSILLECTOMY  AS CHILD  ? ? ?FAMILY HISTORY: ?Family History  ?Problem Relation Age of Onset  ? Emphysema Mother   ?  Cancer Father   ?     bile duct  ? ? ?SOCIAL HISTORY: ?Social History  ? ?Socioeconomic History  ? Marital status: Married  ?  Spouse name: Ziere Docken  ? Number of children: 4  ? Years of education: 94 +  ? Highest education level: Associate degree: occupational, Hotel manager, or vocational program  ?Occupational History  ? Occupation: landscape arch  ?  Employer: [employer]  ?Tobacco Use  ? Smoking status: Former  ?  Packs/day: 1.00  ?  Years: 12.00  ?  Pack years: 12.00  ?  Types: Cigarettes  ?  Quit date: 12/18/2009  ?  Years since quitting: 12.2  ? Smokeless tobacco: Never  ?Vaping Use  ? Vaping Use: Never used  ?Substance and Sexual Activity  ? Alcohol use: Yes  ?  Alcohol/week: 3.0 standard drinks  ?  Types: 3 Glasses of wine per week  ?  Comment: sometimes   ?  Drug use: No  ? Sexual activity: Not Currently  ?Other Topics Concern  ? Not on file  ?Social History Narrative  ? Not on file  ? ?Social Determinants of Health  ? ?Financial Resource Strain: Not on file  ?Food Insecurity: Not on file  ?Transportation Needs: Not on file  ?Physical Activity: Not on file  ?Stress: Not on file  ?Social Connections: Not on file  ?Intimate Partner Violence: Not on file  ? ? ?PHYSICAL EXAM ? ?GENERAL EXAM/CONSTITUTIONAL: ?Vitals:  ?Vitals:  ? 03/23/22 1419  ?BP: 126/77  ?Pulse: 82  ?Weight: 144 lb 8 oz (65.5 kg)  ?Height: '5\' 7"'$  (1.702 m)  ? ?Body mass index is 22.63 kg/m?. ?Wt Readings from Last 3 Encounters:  ?03/23/22 144 lb 8 oz (65.5 kg)  ?09/27/21 156 lb 3.2 oz (70.9 kg)  ?05/13/21 159 lb (72.1 kg)  ? ?Patient is in no distress; well developed, nourished and groomed; neck is supple ? ?CARDIOVASCULAR: ?Examination of carotid arteries is normal; no carotid bruits ?Regular rate and rhythm, no murmurs ?Examination of peripheral vascular system by observation and palpation is normal ? ?EYES: ?Pupils round and reactive to light, Visual fields full to confrontation, Extraocular movements intacts,  ? ?MUSCULOSKELETAL: ?Gait,  strength, tone, movements noted in Neurologic exam below ? ?NEUROLOGIC: ?MENTAL STATUS:  ? ?  03/23/2022  ?  2:29 PM  ?MMSE - Mini Mental State Exam  ?Orientation to time 2  ?Orientation to Place 5  ?Registrat

## 2022-03-24 LAB — DEMENTIA PANEL
Homocysteine: 20.8 umol/L — ABNORMAL HIGH (ref 0.0–19.2)
RPR Ser Ql: NONREACTIVE
TSH: 2.31 u[IU]/mL (ref 0.450–4.500)
Vitamin B-12: 265 pg/mL (ref 232–1245)

## 2022-05-18 ENCOUNTER — Ambulatory Visit: Payer: Medicare Other | Admitting: Nurse Practitioner

## 2022-05-18 ENCOUNTER — Encounter: Payer: Self-pay | Admitting: Nurse Practitioner

## 2022-05-18 VITALS — BP 116/68 | HR 74 | Temp 98.3°F | Ht 67.0 in | Wt 150.0 lb

## 2022-05-18 DIAGNOSIS — J45901 Unspecified asthma with (acute) exacerbation: Secondary | ICD-10-CM

## 2022-05-18 DIAGNOSIS — J441 Chronic obstructive pulmonary disease with (acute) exacerbation: Secondary | ICD-10-CM

## 2022-05-18 MED ORDER — ALBUTEROL SULFATE HFA 108 (90 BASE) MCG/ACT IN AERS: 2.0000 | INHALATION_SPRAY | Freq: Four times a day (QID) | RESPIRATORY_TRACT | 3 refills | Status: DC | PRN

## 2022-05-18 MED ORDER — BREZTRI AEROSPHERE 160-9-4.8 MCG/ACT IN AERO
2.0000 | INHALATION_SPRAY | Freq: Two times a day (BID) | RESPIRATORY_TRACT | 0 refills | Status: DC
Start: 2022-05-18 — End: 2022-06-12

## 2022-05-18 MED ORDER — PREDNISONE 20 MG PO TABS: 40.0000 mg | ORAL_TABLET | Freq: Every day | ORAL | 0 refills | Status: AC

## 2022-05-18 MED ORDER — BREZTRI AEROSPHERE 160-9-4.8 MCG/ACT IN AERO: 2.0000 | INHALATION_SPRAY | Freq: Two times a day (BID) | RESPIRATORY_TRACT | 6 refills | Status: DC

## 2022-05-18 NOTE — Patient Instructions (Addendum)
-  Start Breztri 2 puffs Twice daily. Brush tongue and rinse mouth afterwards  -Continue Albuterol inhaler 2 puffs every 6 hours ONLY as needed for shortness of breath or wheezing. Notify if symptoms persist despite rescue inhaler/neb use.  Prednisone 40 mg (2 tabs) daily for 5 days. Take in AM with food   Follow up in two weeks with Dr. Annamaria Boots or Katie Shyane Fossum,NP. If symptoms do not improve or worsen, please contact office for sooner follow up or seek emergency care.

## 2022-05-18 NOTE — Assessment & Plan Note (Signed)
High symptom burden with frequent use of albuterol. No current rescue inhaler. Evidence of bronchospasm on exam - prednisone burst. Advised we start him on triple therapy with Breztri given his symptoms and poor response to Advair alone. Lengthy discussion about the importance of maintenance therapy vs rescue. Verbalized understanding.   Patient Instructions  -Start Breztri 2 puffs Twice daily. Brush tongue and rinse mouth afterwards  -Continue Albuterol inhaler 2 puffs every 6 hours ONLY as needed for shortness of breath or wheezing. Notify if symptoms persist despite rescue inhaler/neb use.  Prednisone 40 mg (2 tabs) daily for 5 days. Take in AM with food   Follow up in two weeks with Dr. Annamaria Boots or Katie Bijan Ridgley,NP. If symptoms do not improve or worsen, please contact office for sooner follow up or seek emergency care.

## 2022-05-18 NOTE — Progress Notes (Signed)
$'@Patient'l$  ID: Kent Flowers, male    DOB: Sep 23, 1946, 76 y.o.   MRN: [id number]  Chief Complaint  Patient presents with   Follow-up    Pt is here to get refills for his Proair inhaler. He saw Dr Annamaria Boots last year and was sent refills but somehow the pharmacy messed up. So he is needed refills for his inhaler. Does not use Advair inhaler. Need to discuss dosage and limit for the proair inhaler    Referring provider: Carylon Perches, NP  HPI: 76 year old male, former smoker followed for COPD mixed type and lung nodules. He is a patient of Dr. Janee Morn and last seen in office on 09/27/2021. Past medical history significant for AAA, CAD, OA, T spine epidural abscess MSSA, BPH, hepatitis, R apical lung mass (resolved).   TEST/EVENTS:  01/24/2012 spirometry: FVC 2.24 (64%), FEV1 1.2 (42%), ratio 53% 03/19/2012 PFTs: moderate obstructive airway disease, insignificant response to BD, air trapping, normal diffusion capacity. Loop contour suggests emphysema. FVC 3.73 (91%), FEV1 2.07 (73%), ratio 0.56, TLC 114%, RV 138%, DLCO 90% 10/01/2013 CT chest: visualized small lung nodules are benign based on current consensus  05/03/2017 spirometry: moderately severe obstruction  01/18/2022 CXR 1 view: there is mild central vascular congestion. No acute airspace disease.  09/27/2021: OV with Dr. Annamaria Boots. Recovering well followed spine surgery for abscess; does have some right arm paresis. He uses his rescue inhaler 3x/day. Doesn't ever notice himself wheezing but he has poor hearing. Recommended he start Advair as maintenance inhaler. Previous RUL density has resolved.  05/18/2022: Today - acute Patient presents today because his pharmacy told him he has been using his albuterol inhaler too often and he has run out of refills. He stopped using the advair because he felt like it didn't help much. He uses his albuterol 4-6 times a day due to SOB. He does not ever notice himself wheezing but he is hard of hearing. He denies  any cough, hemoptysis, anorexia, weight loss, leg swelling. No significant allergy symptoms.   Allergies  Allergen Reactions   Ceclor [Cefaclor] Rash   Sulfa Antibiotics Rash and Other (See Comments)    SEVERE RASH- childhood allergy    Immunization History  Administered Date(s) Administered   Fluad Quad(high Dose 65+) 09/27/2021   Influenza Split 10/18/2013   Influenza, High Dose Seasonal PF 09/01/2020   Moderna SARS-COV2 Booster Vaccination 11/16/2020   Pneumococcal Conjugate-13 05/09/2018   Pneumococcal Polysaccharide-23 11/03/2013, 09/01/2020   Tdap 07/14/2012, 11/07/2019    Past Medical History:  Diagnosis Date   Benign localized prostatic hyperplasia with lower urinary tract symptoms (LUTS)    Chronic low back pain    Common bile duct stone 11/03/2020   COPD (chronic obstructive pulmonary disease) with emphysema ([place])    PULMOLOGIST-- DR Tarri Fuller YOUNG   Diskitis 05/10/2020   Dizziness 05/10/2020   Ectatic thoracic aorta (HCC)    ED (erectile dysfunction)    Fecal incontinence 03/24/2020   Full dentures    Gross hematuria    History of squamous cell carcinoma in situ    03-14-2013---  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 30 tablet 11   gabapentin (NEURONTIN) 300 MG capsule Take 1 capsule (300 mg total) by mouth 3 (three) times daily. (Patient taking differently: Take 600 mg by mouth 3 (three) times daily.)     oxyCODONE (ROXICODONE) 15 MG immediate release tablet Take 1 tablet (15 mg total) by mouth 5 (five) times daily as needed for pain. 15 tablet 0   testosterone cypionate (DEPOTESTOSTERONE CYPIONATE) 200 MG/ML injection Inject 180 mg into the muscle every 14 (fourteen) days.      albuterol (PROAIR HFA) 108 (90 Base) MCG/ACT inhaler INHALE 2 PUFFS INTO THE LUNGS EVERY 6 HOURS AS NEEDED FOR WHEEZING ORSHORTNESS OF BREATH (Patient taking differently: Inhale 2 puffs into the lungs every 6 (six) hours as needed for shortness of breath or wheezing.) 8.5 g 12   fluticasone-salmeterol (ADVAIR) 250-50 MCG/ACT AEPB Inhale 1 puff into the lungs every 12 (twelve) hours. 60 each 11   No facility-administered medications prior to visit.     Review of Systems:   Constitutional: No weight loss or gain, night sweats, fevers, chills, fatigue, or lassitude. HEENT: No headaches, difficulty swallowing, tooth/dental problems, or sore throat. No sneezing, itching, ear ache, nasal congestion, or post nasal drip CV:  No chest pain, orthopnea, PND, swelling in lower extremities, anasarca, dizziness, palpitations, syncope Resp: +shortness of breath with exertion. No excess mucus or change in color of mucus. No productive or non-productive. No hemoptysis. No wheezing.  No chest wall deformity GI:  No heartburn, indigestion, abdominal pain, nausea, vomiting, diarrhea, change in bowel habits, loss of appetite, bloody stools.  Skin: No rash, lesions, ulcerations MSK:  No joint pain or swelling.  No  decreased range of motion.  No back pain. Neuro: No dizziness or lightheadedness.  Psych: No depression or anxiety. Mood stable.     Physical Exam:  BP 116/68 (BP Location: Right Arm, Patient Position: Sitting, Cuff Size: Normal)   Pulse 74   Temp 98.3 F (36.8 C) (Oral)   Ht '5\' 7"'$  (1.702 m)   Wt 150 lb (68 kg)   SpO2 94%   BMI 23.49 kg/m   GEN: Pleasant, interactive, chronically-ill appearing; in no acute distress. HEENT:  Normocephalic and atraumatic. PERRLA. Sclera white. Nasal turbinates pink, moist and patent bilaterally. No rhinorrhea present. Oropharynx pink and moist, without exudate or edema. No lesions, ulcerations, or postnasal drip.  NECK:  Supple w/ fair ROM. No JVD present. Normal carotid impulses w/o bruits. Thyroid symmetrical with no goiter or nodules palpated. No lymphadenopathy.   CV: RRR, no m/r/g, no peripheral edema. Pulses intact, +2 bilaterally. No cyanosis, pallor or clubbing. PULMONARY:  Unlabored, regular breathing. Scattered wheezes bilaterally A&P. No accessory muscle use. No dullness to percussion. GI: BS present  and normoactive. Soft, non-tender to palpation. No organomegaly or masses detected. No CVA tenderness. MSK: No erythema, warmth or tenderness. Cap refil <2 sec all extrem. No deformities or joint swelling noted.  Neuro: A/Ox3. No focal deficits noted.   Skin: Warm, no lesions or rashe Psych: Normal affect and behavior. Judgement and thought content appropriate.     Lab Results:  CBC    Component Value Date/Time   WBC 8.1 01/20/2022 0212   RBC 3.61 (L) 01/20/2022 0212   HGB 10.3 (L) 01/20/2022 0212   HCT 33.1 (L) 01/20/2022 0212   PLT 313 01/20/2022 0212   MCV 91.7 01/20/2022 0212   MCV 98.4 (A) 05/01/2015 1052   MCH 28.5 01/20/2022 0212   MCHC 31.1 01/20/2022 0212   RDW 13.1 01/20/2022 0212   LYMPHSABS 2.5 01/18/2022 2034   MONOABS 0.6 01/18/2022 2034   EOSABS 0.4 01/18/2022 2034   BASOSABS 0.0 01/18/2022 2034    BMET     Component Value Date/Time   NA 142 01/20/2022 0212   K 3.6 01/20/2022 0212   CL 106 01/20/2022 0212   CO2 26 01/20/2022 0212   GLUCOSE 103 (H) 01/20/2022 0212   BUN 17 01/20/2022 0212   CREATININE 1.36 (H) 01/20/2022 0212   CREATININE 1.19 (H) 05/13/2021 1402   CALCIUM 8.6 (L) 01/20/2022 0212   GFRNONAA 55 (L) 01/20/2022 0212   GFRNONAA 60 05/13/2021 1402   GFRAA 70 05/13/2021 1402    BNP    Component Value Date/Time   BNP 86.5 01/19/2022 0417     Imaging:  No results found.        View : No data to display.          No results found for: NITRICOXIDE      Assessment & Plan:   COPD with acute exacerbation ([place]) High symptom burden with frequent use of albuterol. No current rescue inhaler. Evidence of bronchospasm on exam - prednisone burst. Advised we start him on triple therapy with Breztri given his symptoms and poor response to Advair alone. Lengthy discussion about the importance of maintenance therapy vs rescue. Verbalized understanding.   Patient Instructions  -Start Breztri 2 puffs Twice daily. Brush tongue and rinse mouth afterwards  -Continue Albuterol inhaler 2 puffs every 6 hours ONLY as needed for shortness of breath or wheezing. Notify if symptoms persist despite rescue inhaler/neb use.  Prednisone 40 mg (2 tabs) daily for 5 days. Take in AM with food   Follow up in two weeks with Dr. Annamaria Boots or Katie Orin Eberwein,NP. If symptoms do not improve or worsen, please contact office for sooner follow up or seek emergency care.    I spent 32 minutes of dedicated to the care of this patient on the date of this encounter to include pre-visit review of records, face-to-face time with the patient discussing conditions above, post visit ordering of testing, clinical documentation with the electronic health record, making appropriate referrals as documented, and communicating necessary findings to members of the patients care team.  Clayton Bibles,  NP 05/18/2022  Pt aware and understands NP's role.

## 2022-06-01 ENCOUNTER — Ambulatory Visit: Payer: Medicare Other | Admitting: Nurse Practitioner

## 2022-06-12 ENCOUNTER — Ambulatory Visit: Payer: Medicare Other | Admitting: Neurology

## 2022-06-12 ENCOUNTER — Encounter: Payer: Self-pay | Admitting: Neurology

## 2022-06-12 VITALS — BP 130/77 | HR 73 | Ht 67.0 in | Wt 160.5 lb

## 2022-06-12 DIAGNOSIS — G301 Alzheimer's disease with late onset: Secondary | ICD-10-CM | POA: Diagnosis not present

## 2022-06-12 DIAGNOSIS — F02A0 Dementia in other diseases classified elsewhere, mild, without behavioral disturbance, psychotic disturbance, mood disturbance, and anxiety: Secondary | ICD-10-CM | POA: Diagnosis not present

## 2022-06-12 MED ORDER — QUETIAPINE FUMARATE 25 MG PO TABS: 25.0000 mg | ORAL_TABLET | Freq: Every day | ORAL | 0 refills | Status: DC

## 2022-07-16 ENCOUNTER — Other Ambulatory Visit: Payer: Self-pay | Admitting: Neurology

## 2022-08-07 ENCOUNTER — Ambulatory Visit (INDEPENDENT_AMBULATORY_CARE_PROVIDER_SITE_OTHER): Payer: Medicare Other | Admitting: Podiatry

## 2022-08-07 ENCOUNTER — Encounter: Payer: Self-pay | Admitting: Podiatry

## 2022-08-07 DIAGNOSIS — M79674 Pain in right toe(s): Secondary | ICD-10-CM | POA: Diagnosis not present

## 2022-08-07 DIAGNOSIS — B351 Tinea unguium: Secondary | ICD-10-CM | POA: Diagnosis not present

## 2022-08-07 DIAGNOSIS — M79675 Pain in left toe(s): Secondary | ICD-10-CM | POA: Diagnosis not present

## 2022-08-07 NOTE — Progress Notes (Signed)
fo

## 2022-08-09 NOTE — Progress Notes (Signed)
Subjective:   Patient ID: Kent Flowers, male   DOB: 76 y.o.   MRN: [id number]   HPI Patient presents stating the nails are extremely thick and and he is not able to cut them any as long term issues with this.  Patient does not smoke is not active   Review of Systems  All other systems reviewed and are negative.       Objective:  Physical Exam Vitals and nursing note reviewed.  Constitutional:      Appearance: He is well-developed.  Pulmonary:     Effort: Pulmonary effort is normal.  Musculoskeletal:        General: Normal range of motion.  Skin:    General: Skin is warm.  Neurological:     Mental Status: He is alert.     Neurovascular status found to be mildly diminished with weak pulses DP PT with neurological mildly diminished with patient found to have diminished range of motion muscle strength and is found to have severely thickened dystrophic nailbeds 1-5 both feet that is painful when pressed and is making shoe gear difficult.  Patient has good digital perfusion well oriented x3     Assessment:  Chronic mycotic nail infection with pain 1-5 both feet with moderate risk factors and inability to take care of himself     Plan:  HNP done and reviewed condition debrided nailbeds 1-5 both feet no iatrogenic bleeding reappoint routine care

## 2022-09-17 ENCOUNTER — Inpatient Hospital Stay (HOSPITAL_BASED_OUTPATIENT_CLINIC_OR_DEPARTMENT_OTHER)
Admission: EM | Admit: 2022-09-17 | Discharge: 2022-09-22 | DRG: 444 | Disposition: A | Discharge status: Home Health | Payer: Medicare Other | Attending: Internal Medicine | Admitting: Internal Medicine

## 2022-09-17 ENCOUNTER — Emergency Department (HOSPITAL_BASED_OUTPATIENT_CLINIC_OR_DEPARTMENT_OTHER): Payer: Medicare Other

## 2022-09-17 ENCOUNTER — Emergency Department (HOSPITAL_BASED_OUTPATIENT_CLINIC_OR_DEPARTMENT_OTHER): Payer: Medicare Other | Admitting: Radiology

## 2022-09-17 ENCOUNTER — Encounter (HOSPITAL_COMMUNITY): Payer: Self-pay

## 2022-09-17 ENCOUNTER — Encounter (HOSPITAL_BASED_OUTPATIENT_CLINIC_OR_DEPARTMENT_OTHER): Payer: Self-pay

## 2022-09-17 ENCOUNTER — Other Ambulatory Visit: Payer: Self-pay

## 2022-09-17 DIAGNOSIS — D638 Anemia in other chronic diseases classified elsewhere: Secondary | ICD-10-CM | POA: Diagnosis present

## 2022-09-17 DIAGNOSIS — E538 Deficiency of other specified B group vitamins: Secondary | ICD-10-CM | POA: Diagnosis present

## 2022-09-17 DIAGNOSIS — N1832 Chronic kidney disease, stage 3b: Secondary | ICD-10-CM | POA: Diagnosis present

## 2022-09-17 DIAGNOSIS — R29810 Facial weakness: Secondary | ICD-10-CM | POA: Diagnosis present

## 2022-09-17 DIAGNOSIS — Z888 Allergy status to other drugs, medicaments and biological substances status: Secondary | ICD-10-CM

## 2022-09-17 DIAGNOSIS — Z789 Other specified health status: Secondary | ICD-10-CM | POA: Diagnosis present

## 2022-09-17 DIAGNOSIS — J189 Pneumonia, unspecified organism: Secondary | ICD-10-CM | POA: Diagnosis present

## 2022-09-17 DIAGNOSIS — D72829 Elevated white blood cell count, unspecified: Secondary | ICD-10-CM | POA: Diagnosis present

## 2022-09-17 DIAGNOSIS — Z79891 Long term (current) use of opiate analgesic: Secondary | ICD-10-CM

## 2022-09-17 DIAGNOSIS — H9190 Unspecified hearing loss, unspecified ear: Secondary | ICD-10-CM | POA: Diagnosis present

## 2022-09-17 DIAGNOSIS — Z79899 Other long term (current) drug therapy: Secondary | ICD-10-CM

## 2022-09-17 DIAGNOSIS — M545 Low back pain, unspecified: Secondary | ICD-10-CM | POA: Diagnosis present

## 2022-09-17 DIAGNOSIS — N400 Enlarged prostate without lower urinary tract symptoms: Secondary | ICD-10-CM | POA: Diagnosis present

## 2022-09-17 DIAGNOSIS — D509 Iron deficiency anemia, unspecified: Secondary | ICD-10-CM | POA: Diagnosis present

## 2022-09-17 DIAGNOSIS — G9340 Encephalopathy, unspecified: Principal | ICD-10-CM

## 2022-09-17 DIAGNOSIS — J439 Emphysema, unspecified: Secondary | ICD-10-CM | POA: Diagnosis present

## 2022-09-17 DIAGNOSIS — K8050 Calculus of bile duct without cholangitis or cholecystitis without obstruction: Secondary | ICD-10-CM | POA: Diagnosis not present

## 2022-09-17 DIAGNOSIS — Z825 Family history of asthma and other chronic lower respiratory diseases: Secondary | ICD-10-CM

## 2022-09-17 DIAGNOSIS — Z8619 Personal history of other infectious and parasitic diseases: Secondary | ICD-10-CM

## 2022-09-17 DIAGNOSIS — I7121 Aneurysm of the ascending aorta, without rupture: Secondary | ICD-10-CM | POA: Diagnosis present

## 2022-09-17 DIAGNOSIS — F02A0 Dementia in other diseases classified elsewhere, mild, without behavioral disturbance, psychotic disturbance, mood disturbance, and anxiety: Secondary | ICD-10-CM | POA: Diagnosis present

## 2022-09-17 DIAGNOSIS — I2510 Atherosclerotic heart disease of native coronary artery without angina pectoris: Secondary | ICD-10-CM | POA: Diagnosis present

## 2022-09-17 DIAGNOSIS — Z87891 Personal history of nicotine dependence: Secondary | ICD-10-CM

## 2022-09-17 DIAGNOSIS — R3915 Urgency of urination: Secondary | ICD-10-CM | POA: Diagnosis present

## 2022-09-17 DIAGNOSIS — G8929 Other chronic pain: Secondary | ICD-10-CM | POA: Diagnosis present

## 2022-09-17 DIAGNOSIS — B1920 Unspecified viral hepatitis C without hepatic coma: Secondary | ICD-10-CM | POA: Diagnosis present

## 2022-09-17 DIAGNOSIS — Z720 Tobacco use: Secondary | ICD-10-CM | POA: Diagnosis present

## 2022-09-17 DIAGNOSIS — Z86008 Personal history of in-situ neoplasm of other site: Secondary | ICD-10-CM

## 2022-09-17 DIAGNOSIS — R0602 Shortness of breath: Secondary | ICD-10-CM | POA: Diagnosis present

## 2022-09-17 DIAGNOSIS — R32 Unspecified urinary incontinence: Secondary | ICD-10-CM | POA: Diagnosis present

## 2022-09-17 DIAGNOSIS — Z882 Allergy status to sulfonamides status: Secondary | ICD-10-CM

## 2022-09-17 DIAGNOSIS — R933 Abnormal findings on diagnostic imaging of other parts of digestive tract: Secondary | ICD-10-CM

## 2022-09-17 DIAGNOSIS — Z800 Family history of malignant neoplasm of digestive organs: Secondary | ICD-10-CM

## 2022-09-17 DIAGNOSIS — J449 Chronic obstructive pulmonary disease, unspecified: Secondary | ICD-10-CM | POA: Diagnosis present

## 2022-09-17 DIAGNOSIS — N401 Enlarged prostate with lower urinary tract symptoms: Secondary | ICD-10-CM | POA: Diagnosis present

## 2022-09-17 DIAGNOSIS — G9341 Metabolic encephalopathy: Secondary | ICD-10-CM | POA: Diagnosis present

## 2022-09-17 DIAGNOSIS — R7989 Other specified abnormal findings of blood chemistry: Secondary | ICD-10-CM | POA: Diagnosis present

## 2022-09-17 DIAGNOSIS — N179 Acute kidney failure, unspecified: Secondary | ICD-10-CM | POA: Diagnosis present

## 2022-09-17 DIAGNOSIS — G301 Alzheimer's disease with late onset: Secondary | ICD-10-CM | POA: Diagnosis present

## 2022-09-17 DIAGNOSIS — N1831 Chronic kidney disease, stage 3a: Secondary | ICD-10-CM | POA: Diagnosis present

## 2022-09-17 DIAGNOSIS — K838 Other specified diseases of biliary tract: Secondary | ICD-10-CM

## 2022-09-17 LAB — URINALYSIS, ROUTINE W REFLEX MICROSCOPIC
Bilirubin Urine: NEGATIVE
Glucose, UA: NEGATIVE mg/dL
Ketones, ur: NEGATIVE mg/dL
Nitrite: NEGATIVE
Protein, ur: 30 mg/dL — AB
Specific Gravity, Urine: 1.014 (ref 1.005–1.030)
WBC, UA: >50 WBC/hpf — ABNORMAL HIGH (ref 0–5)
pH: 6.5 (ref 5.0–8.0)

## 2022-09-17 LAB — I-STAT VENOUS BLOOD GAS, ED
Acid-Base Excess: 4 mmol/L — ABNORMAL HIGH (ref 0.0–2.0)
Bicarbonate: 30.2 mmol/L — ABNORMAL HIGH (ref 20.0–28.0)
Calcium, Ion: 1.22 mmol/L (ref 1.15–1.40)
HCT: 47 % (ref 39.0–52.0)
Hemoglobin: 16 g/dL (ref 13.0–17.0)
O2 Saturation: 79 %
Patient temperature: 98
Potassium: 4.1 mmol/L (ref 3.5–5.1)
Sodium: 137 mmol/L (ref 135–145)
TCO2: 32 mmol/L (ref 22–32)
pCO2, Ven: 47.5 mmHg (ref 44–60)
pH, Ven: 7.41 (ref 7.25–7.43)
pO2, Ven: 43 mmHg (ref 32–45)

## 2022-09-17 LAB — AMMONIA: Ammonia: 34 umol/L (ref 9–35)

## 2022-09-17 LAB — DIFFERENTIAL
Abs Immature Granulocytes: 0.07 10*3/uL (ref 0.00–0.07)
Basophils Absolute: 0.1 10*3/uL (ref 0.0–0.1)
Basophils Relative: 0 %
Eosinophils Absolute: 0.1 10*3/uL (ref 0.0–0.5)
Eosinophils Relative: 0 %
Immature Granulocytes: 0 %
Lymphocytes Relative: 10 %
Lymphs Abs: 1.7 10*3/uL (ref 0.7–4.0)
Monocytes Absolute: 1.3 10*3/uL — ABNORMAL HIGH (ref 0.1–1.0)
Monocytes Relative: 8 %
Neutro Abs: 13.6 10*3/uL — ABNORMAL HIGH (ref 1.7–7.7)
Neutrophils Relative %: 82 %

## 2022-09-17 LAB — COMPREHENSIVE METABOLIC PANEL
ALT: 66 U/L — ABNORMAL HIGH (ref 0–44)
AST: 64 U/L — ABNORMAL HIGH (ref 15–41)
Albumin: 4.5 g/dL (ref 3.5–5.0)
Alkaline Phosphatase: 196 U/L — ABNORMAL HIGH (ref 38–126)
Anion gap: 9 (ref 5–15)
BUN: 27 mg/dL — ABNORMAL HIGH (ref 8–23)
CO2: 27 mmol/L (ref 22–32)
Calcium: 10 mg/dL (ref 8.9–10.3)
Chloride: 101 mmol/L (ref 98–111)
Creatinine, Ser: 1.7 mg/dL — ABNORMAL HIGH (ref 0.61–1.24)
GFR, Estimated: 42 mL/min — ABNORMAL LOW (ref >60)
Glucose, Bld: 116 mg/dL — ABNORMAL HIGH (ref 70–99)
Potassium: 4.4 mmol/L (ref 3.5–5.1)
Sodium: 137 mmol/L (ref 135–145)
Total Bilirubin: 1.7 mg/dL — ABNORMAL HIGH (ref 0.3–1.2)
Total Protein: 7.8 g/dL (ref 6.5–8.1)

## 2022-09-17 LAB — CBC
HCT: 49.5 % (ref 39.0–52.0)
Hemoglobin: 16.2 g/dL (ref 13.0–17.0)
MCH: 30.2 pg (ref 26.0–34.0)
MCHC: 32.7 g/dL (ref 30.0–36.0)
MCV: 92.2 fL (ref 80.0–100.0)
Platelets: 308 10*3/uL (ref 150–400)
RBC: 5.37 MIL/uL (ref 4.22–5.81)
RDW: 14.1 % (ref 11.5–15.5)
WBC: 16.7 10*3/uL — ABNORMAL HIGH (ref 4.0–10.5)
nRBC: 0 % (ref 0.0–0.2)

## 2022-09-17 LAB — LACTIC ACID, PLASMA
Lactic Acid, Venous: 0.7 mmol/L (ref 0.5–1.9)
Lactic Acid, Venous: 0.6 mmol/L (ref 0.5–1.9)

## 2022-09-17 LAB — PROTIME-INR
INR: 1 (ref 0.8–1.2)
Prothrombin Time: 13.5 seconds (ref 11.4–15.2)

## 2022-09-17 LAB — APTT: aPTT: 46 seconds — ABNORMAL HIGH (ref 24–36)

## 2022-09-17 LAB — CBG MONITORING, ED: Glucose-Capillary: 117 mg/dL — ABNORMAL HIGH (ref 70–99)

## 2022-09-17 LAB — ETHANOL: Alcohol, Ethyl (B): <10 mg/dL (ref <10)

## 2022-09-17 MED ORDER — OXYCODONE HCL 5 MG PO TABS
15.0000 mg | ORAL_TABLET | Freq: Four times a day (QID) | ORAL | Status: DC | PRN
  Administered 2022-09-17 – 2022-09-22 (×14): 15 mg via ORAL
  Filled 2022-09-17 (×15): qty 3

## 2022-09-17 MED ORDER — ALBUTEROL SULFATE HFA 108 (90 BASE) MCG/ACT IN AERS
2.0000 | INHALATION_SPRAY | Freq: Four times a day (QID) | RESPIRATORY_TRACT | Status: DC | PRN
  Administered 2022-09-17 – 2022-09-18 (×2): 2 via RESPIRATORY_TRACT
  Filled 2022-09-17: qty 6.7

## 2022-09-17 MED ORDER — AEROCHAMBER PLUS FLO-VU MISC
1.0000 | Freq: Once | Status: AC
  Administered 2022-09-17: 1
  Filled 2022-09-17: qty 1

## 2022-09-17 MED ORDER — METRONIDAZOLE 500 MG/100ML IV SOLN
500.0000 mg | Freq: Two times a day (BID) | INTRAVENOUS | Status: DC
  Administered 2022-09-17 – 2022-09-22 (×10): 500 mg via INTRAVENOUS
  Filled 2022-09-17 (×10): qty 100

## 2022-09-17 MED ORDER — CIPROFLOXACIN IN D5W 400 MG/200ML IV SOLN
400.0000 mg | Freq: Two times a day (BID) | INTRAVENOUS | Status: DC
  Administered 2022-09-17 – 2022-09-18 (×3): 400 mg via INTRAVENOUS
  Filled 2022-09-17 (×4): qty 200

## 2022-09-17 MED ORDER — LACTATED RINGERS IV BOLUS
1000.0000 mL | Freq: Once | INTRAVENOUS | Status: AC
  Administered 2022-09-17: 1000 mL via INTRAVENOUS

## 2022-09-17 MED ORDER — IOHEXOL 300 MG/ML  SOLN
100.0000 mL | Freq: Once | INTRAMUSCULAR | Status: AC | PRN
  Administered 2022-09-17: 100 mL via INTRAVENOUS

## 2022-09-17 NOTE — ED Provider Notes (Signed)
Care transferred to me.  Urine is concerning for UTI, which she has had before.  However the CT is concerning for choledocholithiasis with CBD dilation.  He does have some mildly abnormal LFTs including a bili of 1.7.  He will be put on Cipro and Flagyl given his allergy list.  Chart review shows he had choledocholithiasis a year ago or so and had ERCP at [facility].  With his confusion/altered mental status am concerned about possible cholangitis. Will send lactate and cultures.  I discussed with Dr. Randel Pigg of GI. GI will consult whether he goes to [facility] or [facility].  Agrees he needs MRCP. I discussed with Dr. Rogers Blocker for admission.  His lactate is okay.  He is mildly confused but not completely encephalopathic.  Will still need admission but for now is stable for waiting on a bed.  CRITICAL CARE Performed by: Ephraim Hamburger   Total critical care time: 30 minutes  Critical care time was exclusive of separately billable procedures and treating other patients.  Critical care was necessary to treat or prevent imminent or life-threatening deterioration.  Critical care was time spent personally by me on the following activities: development of treatment plan with patient and/or surrogate as well as nursing, discussions with consultants, evaluation of patient's response to treatment, examination of patient, obtaining history from patient or surrogate, ordering and performing treatments and interventions, ordering and review of laboratory studies, ordering and review of radiographic studies, pulse oximetry and re-evaluation of patient's condition.    Sherwood Gambler, MD 09/17/22 2005

## 2022-09-17 NOTE — ED Triage Notes (Addendum)
Patient here POV from Home with Son.  Son states Patient called him today stating he felt confused and would like someone to be with him. Son also noted a Right Sided Facial Droop today that is visualized in Triage. No Other New Numbness or Neurological Deficits.   LSN was Saturday at 1700 when Daughter left. VAN Assessment Negative. No New Weakness Noted.  NAD Noted during Triage. Confused in Triage. GCS 14. BIB Wheelchair.

## 2022-09-17 NOTE — ED Notes (Signed)
One blood culture obtained prior to antibiotics the patient refused 2nd stickfor blood culture.

## 2022-09-17 NOTE — ED Notes (Signed)
RT asked by pt to call family to bring his breztri inhaler for his night time dose since he will not get a bed until morning. Pt family states they will be here in the morning and will bring his inhaler then. Pt requesting a PRN albuterol treatment. Pt home regimen is albuterol q6hr PRN. RT educated pt on proper use of MDI w/spacer for better deposition and relief of symptoms. Pt verbalizes understanding. RT will continue to monitor while pt in [facility] ED.

## 2022-09-17 NOTE — ED Notes (Signed)
Kent Flowers [id number] [name] [phone] please call when pt gets a bed or with updates

## 2022-09-17 NOTE — Progress Notes (Signed)
Plan of Care Note for accepted transfer   Patient: Kent Flowers MRN: [id number]   DOA: 09/17/2022  Facility requesting transfer: [facility] Requesting Provider: Dr. 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   Vitals: stable Pertinent labs: wbc: 16.7, BUN: 27, creatinine 1.70, UA: large LE< >50WBC, moderate hgb,  CT head: no acute finding CXR: chrronic bronchitic markings. No acute finding CT abdomen: 1. Dilated common bile duct with likely choledocholithiasis given debris noted within the distal common bile duct. Differential diagnosis includes intraluminal mass. Recommend MRCP further evaluation. 2. Mild bilateral hydroureter with fullness of the left renal pelvis. This may reflect the residual of a recently passed calculus or reflect chronic changes of obstructive uropathy or reflux. 3. Indeterminate aortocaval 3.8 x 2.1 cm density at the level of the aortic bifurcation. Recommend attention on follow-up in the setting of prior malignancy.  In ED: started on cipro/flagyl (has allergy to cefaclor, but I think has had cephalosporin in the past) would verify with pharmacy and change over to rocephin.   Hx of several episodes of choledocholithiasis. Was first diagnosed with obstructing stone in 09/2020 and reportedly underwent ERCP without sphincterotomy or stent placement. He did well following but had a nonspecific episode of abdominal pain and was evaluated by his gastroenterologist and found to have additional obstructing stone. He most recently underwent ERCP with sphincterotomy 04/14/21 followed by stent removal 06/24/21. Evaluated by general surgery on 07/2021 for consideration of cholecystectomy. Planned for surgery 08/2021, but cancelled, unsure why.    Plan of care: The  patient is accepted for admission to Telemetry unit, at [place] for acute encephalopathy w/u although appears infectious and for possible choledocholithiasis and need for MRCP.  GI to be consulted, may need MRCP.   Author: Orma Flaming, MD 09/17/2022  Check [email or web address] for on-call coverage.  Nursing staff, Please call [place] number on Amion as soon as patient's arrival, so appropriate admitting provider can evaluate the pt.

## 2022-09-17 NOTE — ED Provider Notes (Addendum)
[facility] EMERGENCY DEPT Provider Note   CSN: [id number] Arrival date & time: 09/17/22  1152     History  Chief Complaint  Patient presents with   Altered Mental Status    Kent Flowers is a 76 y.o. male.   Altered Mental Status Presenting symptoms: confusion      76 year old male with medical history significant for COPD, hepatitis C, ascending aortic aneurysm, CAD, dens fracture, history of surgical arthroscopy with rotator cuff repair and subacromial decompression with residual right arm weakness, with additional history of thoracic laminectomy for epidural abscess, who presents to the ED with confusion. Last known normal was 1700 yesterday.  Family thought patient had slight right sided facial droop on arrival. He arrived GCS 14, ABC intact. No other new weakness, numbness, headache, neck pain, gait abnormality. No fevers or chills.   Home Medications Prior to Admission medications   Medication Sig Start Date End Date Taking? Authorizing Provider  albuterol (PROAIR HFA) 108 (90 Base) MCG/ACT inhaler Inhale 2 puffs into the lungs every 6 (six) hours as needed for shortness of breath or wheezing. 05/18/22   Cobb, Karie Schwalbe, NP  Budeson-Glycopyrrol-Formoterol (BREZTRI AEROSPHERE) 160-9-4.8 MCG/ACT AERO Inhale 2 puffs into the lungs in the morning and at bedtime. 05/18/22   Cobb, Karie Schwalbe, NP  donepezil (ARICEPT) 10 MG tablet Take 1 tablet (10 mg total) by mouth at bedtime. 03/23/22   Alric Ran, MD  gabapentin (NEURONTIN) 300 MG capsule Take 1 capsule (300 mg total) by mouth 3 (three) times daily. Patient taking differently: Take 600 mg by mouth 3 (three) times daily. 12/25/19   Earlene Plater, MD  oxyCODONE (ROXICODONE) 15 MG immediate release tablet Take 1 tablet (15 mg total) by mouth 5 (five) times daily as needed for pain. 01/20/22   Caren Griffins, MD  QUEtiapine (SEROQUEL) 25 MG tablet TAKE 1 TABLET BY MOUTH EVERYDAY AT BEDTIME 07/17/22   Alric Ran, MD  testosterone cypionate (DEPOTESTOSTERONE CYPIONATE) 200 MG/ML injection Inject 180 mg into the muscle every 14 (fourteen) days.  09/17/19   [provider]      Allergies    Ceclor [cefaclor] and Sulfa antibiotics    Review of Systems   Review of Systems  Neurological:  Positive for facial asymmetry.  Psychiatric/Behavioral:  Positive for confusion.   All other systems reviewed and are negative.   Physical Exam Updated Vital Signs BP 136/84 (BP Location: Left Arm)   Pulse 83   Temp 98 F (36.7 C)   Resp 16   Ht '5\' 7"'$  (1.702 m)   Wt 72.8 kg   SpO2 95%   BMI 25.14 kg/m  Physical Exam Vitals and nursing note reviewed.  Constitutional:      General: He is not in acute distress.    Appearance: He is well-developed.     Comments: GCS 14, ABC intact  HENT:     Head: Normocephalic and atraumatic.  Eyes:     Conjunctiva/sclera: Conjunctivae normal.  Cardiovascular:     Rate and Rhythm: Normal rate and regular rhythm.  Pulmonary:     Effort: Pulmonary effort is normal. No respiratory distress.     Breath sounds: Normal breath sounds.  Abdominal:     Palpations: Abdomen is soft.     Tenderness: There is abdominal tenderness in the right upper quadrant. There is no guarding or rebound. Negative signs include Murphy's sign.  Musculoskeletal:        General: No swelling.     Cervical  back: Neck supple.  Skin:    General: Skin is warm and dry.     Capillary Refill: Capillary refill takes less than 2 seconds.  Neurological:     Mental Status: He is alert and oriented to person, place, and time.     GCS: GCS eye subscore is 4. GCS verbal subscore is 4. GCS motor subscore is 6.     Cranial Nerves: No cranial nerve deficit.     Sensory: No sensory deficit.     Motor: No weakness.     Coordination: Coordination normal.     Comments: No asterixis  Psychiatric:        Mood and Affect: Mood normal.    ED Results / Procedures / Treatments   Labs (all labs  ordered are listed, but only abnormal results are displayed) Labs Reviewed  APTT - Abnormal; Notable for the following components:      Result Value   aPTT 46 (*)    All other components within normal limits  CBC - Abnormal; Notable for the following components:   WBC 16.7 (*)    All other components within normal limits  DIFFERENTIAL - Abnormal; Notable for the following components:   Neutro Abs 13.6 (*)    Monocytes Absolute 1.3 (*)    All other components within normal limits  COMPREHENSIVE METABOLIC PANEL - Abnormal; Notable for the following components:   Glucose, Bld 116 (*)    BUN 27 (*)    Creatinine, Ser 1.70 (*)    AST 64 (*)    ALT 66 (*)    Alkaline Phosphatase 196 (*)    Total Bilirubin 1.7 (*)    GFR, Estimated 42 (*)    All other components within normal limits  CBG MONITORING, ED - Abnormal; Notable for the following components:   Glucose-Capillary 117 (*)    All other components within normal limits  PROTIME-INR  ETHANOL  URINALYSIS, ROUTINE W REFLEX MICROSCOPIC  CBG MONITORING, ED    EKG EKG Interpretation  Date/Time:  Sunday September 17 2022 12:28:53 EDT Ventricular Rate:  80 PR Interval:  164 QRS Duration: 82 QT Interval:  362 QTC Calculation: 417 R Axis:   -60 Text Interpretation: Normal sinus rhythm Left axis deviation Inferior infarct , age undetermined Possible Anterior infarct , age undetermined Abnormal ECG When compared with ECG of 18-Jan-2022 20:54, PREVIOUS ECG IS PRESENT Confirmed by Regan Lemming ([id number]) on 09/17/2022 12:35:30 PM  Radiology CT HEAD WO CONTRAST  Result Date: 09/17/2022 CLINICAL DATA:  Confusion.  Right facial droop today. EXAM: CT HEAD WITHOUT CONTRAST TECHNIQUE: Contiguous axial images were obtained from the base of the skull through the vertex without intravenous contrast. RADIATION DOSE REDUCTION: This exam was performed according to the departmental dose-optimization program which includes automated exposure control,  adjustment of the mA and/or kV according to patient size and/or use of iterative reconstruction technique. COMPARISON:  01/18/2022. FINDINGS: Brain: No evidence of acute infarction, hemorrhage, hydrocephalus, extra-axial collection or mass lesion/mass effect. Mild patchy periventricular white matter hypoattenuation consistent with chronic microvascular ischemic change. Stable. Vascular: No hyperdense vessel or unexpected calcification. Skull: Normal. Negative for fracture or focal lesion. Sinuses/Orbits: Globes and orbits are unremarkable. Mild scattered mucosal thickening, inferior frontal and bilateral ethmoid sinuses, minimally in the maxillary sinuses. Other: None. IMPRESSION: 1. No acute intracranial abnormalities. Electronically Signed   By: Lajean Manes M.D.   On: 09/17/2022 13:01    Procedures Procedures    Medications Ordered in ED Medications - No data  to display  ED Course/ Medical Decision Making/ A&P Clinical Course as of 09/17/22 1522  Sun Sep 17, 2022  1519 WBC(!): 16.7 [JL]    Clinical Course User Index [JL] Regan Lemming, MD                           Medical Decision Making Amount and/or Complexity of Data Reviewed Labs: ordered. Decision-making details documented in ED Course. Radiology: ordered.  Risk Prescription drug management. Decision regarding hospitalization.     76 year old male with medical history significant for COPD, hepatitis C, ascending aortic aneurysm, CAD, dens fracture, history of surgical arthroscopy with rotator cuff repair and subacromial decompression with residual right arm weakness, with additional history of thoracic laminectomy for epidural abscess, who presents to the ED with confusion. Last known normal was 1700 yesterday.  Family thought patient had slight right sided facial droop on arrival. He arrived GCS 14, ABC intact. No other new weakness, numbness, headache, neck pain, gait abnormality. No fevers or chills.   On arrival, the  patient was not meeting SIRS criteria, afebrile, not tachycardic or tachypneic, BP 136/84, saturating 95% on room air.  Physical exam significant for a patient with a normal neurologic exam, no asterixis, right upper quadrant tenderness to palpation on exam.  Patient presenting with altered mental status with a differential diagnosis that is broad and includes electrolyte abnormality, toxic metabolic derangement, infectious etiology such as UTI, pyelonephritis, cholecystitis, other intra-abdominal infection, pneumonia.  Additionally considered CVA with questionable right facial droop reported by family and in triage although not clearly evident on my initial evaluation.  Initial work-up significant for leukocytosis to 16.7, no anemia, CMP with evidence of an AKI with a creatinine of 1.7, elevated LFTs with an AST of 64, ALT of 66, elevated alkaline phosphatase to 196, elevated T. bili to 1.7.  Urinalysis collected and pending, VBG ordered.  CT head performed without acute intracranial abnormality.  We will obtain chest x-ray and CT of the abdomen pelvis and reassess.  Plan at time of signout to reassess the patient following CT imaging with a plan for likely admission given his altered mental status.  Signout given to Dr. Verner Chol at 1500.   Final Clinical Impression(s) / ED Diagnoses Final diagnoses:  None    Rx / DC Orders ED Discharge Orders     None         Regan Lemming, MD 09/17/22 1914    Regan Lemming, MD 09/17/22 2325    Regan Lemming, MD 09/17/22 2326

## 2022-09-17 NOTE — ED Notes (Signed)
Patient back from CT at this time

## 2022-09-18 DIAGNOSIS — J439 Emphysema, unspecified: Secondary | ICD-10-CM | POA: Diagnosis present

## 2022-09-18 DIAGNOSIS — F02A0 Dementia in other diseases classified elsewhere, mild, without behavioral disturbance, psychotic disturbance, mood disturbance, and anxiety: Secondary | ICD-10-CM | POA: Diagnosis present

## 2022-09-18 DIAGNOSIS — K8050 Calculus of bile duct without cholangitis or cholecystitis without obstruction: Secondary | ICD-10-CM | POA: Diagnosis present

## 2022-09-18 DIAGNOSIS — Z888 Allergy status to other drugs, medicaments and biological substances status: Secondary | ICD-10-CM | POA: Diagnosis not present

## 2022-09-18 DIAGNOSIS — Z882 Allergy status to sulfonamides status: Secondary | ICD-10-CM | POA: Diagnosis not present

## 2022-09-18 DIAGNOSIS — N1832 Chronic kidney disease, stage 3b: Secondary | ICD-10-CM | POA: Diagnosis present

## 2022-09-18 DIAGNOSIS — N1831 Chronic kidney disease, stage 3a: Secondary | ICD-10-CM | POA: Diagnosis not present

## 2022-09-18 DIAGNOSIS — G9340 Encephalopathy, unspecified: Secondary | ICD-10-CM | POA: Diagnosis not present

## 2022-09-18 DIAGNOSIS — M5440 Lumbago with sciatica, unspecified side: Secondary | ICD-10-CM | POA: Diagnosis not present

## 2022-09-18 DIAGNOSIS — M545 Low back pain, unspecified: Secondary | ICD-10-CM | POA: Diagnosis present

## 2022-09-18 DIAGNOSIS — G8929 Other chronic pain: Secondary | ICD-10-CM | POA: Diagnosis present

## 2022-09-18 DIAGNOSIS — R7989 Other specified abnormal findings of blood chemistry: Secondary | ICD-10-CM | POA: Diagnosis present

## 2022-09-18 DIAGNOSIS — G301 Alzheimer's disease with late onset: Secondary | ICD-10-CM | POA: Diagnosis present

## 2022-09-18 DIAGNOSIS — Z79891 Long term (current) use of opiate analgesic: Secondary | ICD-10-CM | POA: Diagnosis not present

## 2022-09-18 DIAGNOSIS — D72829 Elevated white blood cell count, unspecified: Secondary | ICD-10-CM | POA: Diagnosis present

## 2022-09-18 DIAGNOSIS — I7121 Aneurysm of the ascending aorta, without rupture: Secondary | ICD-10-CM | POA: Diagnosis present

## 2022-09-18 DIAGNOSIS — G9341 Metabolic encephalopathy: Secondary | ICD-10-CM | POA: Diagnosis present

## 2022-09-18 DIAGNOSIS — N400 Enlarged prostate without lower urinary tract symptoms: Secondary | ICD-10-CM | POA: Diagnosis present

## 2022-09-18 DIAGNOSIS — J449 Chronic obstructive pulmonary disease, unspecified: Secondary | ICD-10-CM | POA: Diagnosis not present

## 2022-09-18 DIAGNOSIS — K838 Other specified diseases of biliary tract: Secondary | ICD-10-CM | POA: Diagnosis not present

## 2022-09-18 DIAGNOSIS — R29810 Facial weakness: Secondary | ICD-10-CM | POA: Diagnosis present

## 2022-09-18 DIAGNOSIS — I2510 Atherosclerotic heart disease of native coronary artery without angina pectoris: Secondary | ICD-10-CM | POA: Diagnosis present

## 2022-09-18 DIAGNOSIS — N179 Acute kidney failure, unspecified: Secondary | ICD-10-CM | POA: Diagnosis present

## 2022-09-18 DIAGNOSIS — Z87891 Personal history of nicotine dependence: Secondary | ICD-10-CM | POA: Diagnosis not present

## 2022-09-18 DIAGNOSIS — K8051 Calculus of bile duct without cholangitis or cholecystitis with obstruction: Secondary | ICD-10-CM | POA: Diagnosis not present

## 2022-09-18 DIAGNOSIS — Z825 Family history of asthma and other chronic lower respiratory diseases: Secondary | ICD-10-CM | POA: Diagnosis not present

## 2022-09-18 DIAGNOSIS — H9190 Unspecified hearing loss, unspecified ear: Secondary | ICD-10-CM | POA: Diagnosis present

## 2022-09-18 DIAGNOSIS — Z800 Family history of malignant neoplasm of digestive organs: Secondary | ICD-10-CM | POA: Diagnosis not present

## 2022-09-18 DIAGNOSIS — D509 Iron deficiency anemia, unspecified: Secondary | ICD-10-CM | POA: Diagnosis present

## 2022-09-18 DIAGNOSIS — B1920 Unspecified viral hepatitis C without hepatic coma: Secondary | ICD-10-CM | POA: Diagnosis present

## 2022-09-18 LAB — URINE CULTURE: Culture: NO GROWTH

## 2022-09-18 LAB — CBG MONITORING, ED: Glucose-Capillary: 103 mg/dL — ABNORMAL HIGH (ref 70–99)

## 2022-09-18 MED ORDER — ONDANSETRON HCL 4 MG/2ML IJ SOLN: 4.0000 mg | Freq: Four times a day (QID) | INTRAMUSCULAR | Status: DC | PRN

## 2022-09-18 MED ORDER — DONEPEZIL HCL 10 MG PO TABS
10.0000 mg | ORAL_TABLET | Freq: Every day | ORAL | Status: DC
  Administered 2022-09-18 – 2022-09-21 (×4): 10 mg via ORAL
  Filled 2022-09-18 (×4): qty 1

## 2022-09-18 MED ORDER — HEPARIN SODIUM (PORCINE) 5000 UNIT/ML IJ SOLN
5000.0000 [IU] | Freq: Three times a day (TID) | INTRAMUSCULAR | Status: AC
  Administered 2022-09-18 – 2022-09-19 (×4): 5000 [IU] via SUBCUTANEOUS
  Filled 2022-09-18 (×4): qty 1

## 2022-09-18 MED ORDER — ONDANSETRON HCL 4 MG PO TABS: 4.0000 mg | ORAL_TABLET | Freq: Four times a day (QID) | ORAL | Status: DC | PRN

## 2022-09-18 MED ORDER — UMECLIDINIUM BROMIDE 62.5 MCG/ACT IN AEPB
1.0000 | INHALATION_SPRAY | Freq: Every day | RESPIRATORY_TRACT | Status: DC
  Administered 2022-09-20 – 2022-09-22 (×3): 1 via RESPIRATORY_TRACT
  Filled 2022-09-18: qty 7

## 2022-09-18 MED ORDER — LACTATED RINGERS IV SOLN: INTRAVENOUS | Status: AC

## 2022-09-18 MED ORDER — SODIUM CHLORIDE 0.9 % IV SOLN
2.0000 g | INTRAVENOUS | Status: DC
  Administered 2022-09-18 – 2022-09-21 (×4): 2 g via INTRAVENOUS
  Filled 2022-09-18 (×5): qty 20

## 2022-09-18 MED ORDER — TAMSULOSIN HCL 0.4 MG PO CAPS
0.4000 mg | ORAL_CAPSULE | Freq: Every day | ORAL | Status: DC
  Administered 2022-09-18 – 2022-09-21 (×4): 0.4 mg via ORAL
  Filled 2022-09-18 (×4): qty 1

## 2022-09-18 MED ORDER — SODIUM CHLORIDE 0.9% FLUSH
3.0000 mL | Freq: Two times a day (BID) | INTRAVENOUS | Status: DC
  Administered 2022-09-18 – 2022-09-21 (×7): 3 mL via INTRAVENOUS

## 2022-09-18 MED ORDER — QUETIAPINE FUMARATE 25 MG PO TABS
25.0000 mg | ORAL_TABLET | Freq: Every day | ORAL | Status: DC
  Administered 2022-09-18 – 2022-09-21 (×4): 25 mg via ORAL
  Filled 2022-09-18 (×4): qty 1

## 2022-09-18 MED ORDER — FLUTICASONE FUROATE-VILANTEROL 200-25 MCG/ACT IN AEPB
1.0000 | INHALATION_SPRAY | Freq: Every day | RESPIRATORY_TRACT | Status: DC
  Administered 2022-09-20 – 2022-09-22 (×3): 1 via RESPIRATORY_TRACT
  Filled 2022-09-18: qty 28

## 2022-09-18 MED ORDER — OXYCODONE HCL 5 MG PO TABS
15.0000 mg | ORAL_TABLET | Freq: Once | ORAL | Status: AC
  Administered 2022-09-18: 15 mg via ORAL

## 2022-09-18 MED ORDER — BUDESON-GLYCOPYRROL-FORMOTEROL 160-9-4.8 MCG/ACT IN AERO: 2.0000 | INHALATION_SPRAY | Freq: Two times a day (BID) | RESPIRATORY_TRACT | Status: DC

## 2022-09-18 NOTE — ED Notes (Signed)
RT educated pt on proper use of flutter valve to aid in secretion removal. Pt able to perform w/out difficulty and verbalizes understanding.

## 2022-09-18 NOTE — ED Notes (Signed)
[facility] RT Note: Pt. administered (2 puffs) Breztri at this time with spacer device which was last dose remaining in pts. Inhaler,family is now at bedside, remain awaiting bed assignment. RT to monitor.

## 2022-09-18 NOTE — ED Notes (Signed)
Pt sleeping  normal rise and fall of chest

## 2022-09-18 NOTE — ED Notes (Signed)
Pt needs assistance with urinal for urination and is good about calling out when he needs to go. Pt has been producing about 150 mL per occurrence

## 2022-09-18 NOTE — ED Notes (Signed)
Asked patient if he would let me draw the second set of blood cultures and he refused to be stuck again. Has had one course of antibiotics.

## 2022-09-18 NOTE — Assessment & Plan Note (Signed)
Presented to ED with some confusion on background of mild dementia/memory impairment.  He seems to have returned to his baseline on admission.  Family noted right facial droop as well which has since resolved.  Confusion may delirium however given report of right facial droop CVA also in differential. -Obtain MRI brain -Continue neurochecks -Continue IV fluids and empiric antibiotics as above

## 2022-09-18 NOTE — ED Notes (Signed)
Per MD, pt allowed to have crackers and water. Pt understood that this may delay his care not being NPO. Family at bedside also understood and educated. Leanne Chang, RN

## 2022-09-18 NOTE — H&P (Signed)
History and Physical    Kent Flowers ENI:[id number] DOB: February 12, 1946 DOA: 09/17/2022  PCP: Carylon Perches, NP  Patient coming from: Home  I have personally briefly reviewed patient's old medical records in [place]  Chief Complaint: Confusion  HPI: Kent Flowers is a 76 y.o. male with medical history significant for Alzheimer's dementia, COPD, CKD stage IIIa, thoracic aortic aneurysm, BPH, chronic pain, history of choledocholithiasis S/p ERCP with sphincterotomy 04/14/2021 followed by stent removal 06/24/2021 who really presented to [facility] ED afternoon 09/17/2022 for evaluation of confusion and right facial droop.  Patient has been having issues with memory impairment and slow speech for at least 6 months now.  On 10/1 he was feeling confused and called his son for assistance.  His son noted that patient had a right sided facial droop when he saw him that was also visualized in triage per ED RN documentation.  Facial droop has since resolved.  Patient reports several days ago experiencing mid abdominal/epigastric pain which also resolved on its own after short period of time.  Currently he feels back to his usual baseline.  He denies any subjective fevers, chills, diaphoresis, nausea, vomiting, dysuria.  [facility] ED Course  Labs/Imaging on admission: I have personally reviewed following labs and imaging studies.  Initial vitals showed BP 136/84, pulse 83, RR 16, temp 98.0 F, SPO2 95% on room air.  Labs show WBC 16.7, hemoglobin 16.2, platelets 308,000, sodium 137, potassium 4.4, bicarb 27, BUN 27, creatinine 1.70 (baseline 1.3), serum glucose 116, AST 64, ALT 66, alk phos 196, total bilirubin 1.7, ammonia 34, lactic acid 0.7 > 0.6.  Blood culture collected 10/1 and second culture collected 10/2.  Urinalysis shows negative nitrates, large leukocytes, 21-50 RBC, >50 WBC, rare bacteria.  Urine culture in process.  CT head without contrast negative for acute  intracranial abnormality.  2 view chest x-ray shows chronic bronchitic markings without focal consolidation, edema, effusion.  CT abdomen/pelvis with contrast shows dilated CBD with likely choledocholithiasis given debris noted within the distal CBD.  Differential includes intraluminal mass.  Mild bilateral hydroureter with fullness of the left renal pelvis noted.  Indeterminate aortocaval 3.8 x 2.1 cm density at the level of the aortic bifurcation seen.  Patient was given 1 L LR, IV ciprofloxacin and Flagyl.  The hospitalist service was consulted to admit for further evaluation and management.  Patient arrived to [facility] inpatient unit evening of 10/2.  Review of Systems: All systems reviewed and are negative except as documented in history of present illness above.   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   Wears glasses      Past Surgical History:  Procedure Laterality Date   CYSTOSCOPY WITH BIOPSY N/A 04/10/2017   Procedure: POSSIBLE BLADDER  BIOPSY;  Surgeon: Festus Aloe, MD;  Location: [facility];  Service: Urology;  Laterality: N/A;   CYSTOSCOPY WITH URETHRAL DILATATION N/A 04/10/2017   Procedure: CYSTOSCOPY WITH BALLOON URETHRALSTRICTURE DILATATION;  Surgeon: Festus Aloe, MD;  Location: [facility];  Service: Urology;  Laterality: N/A;   PENILE BIOPSY N/A 03/14/2013   Procedure: PENILE BIOPSY;  Surgeon: Fredricka Bonine, MD;  Location: [facility];  Service: Urology;  Laterality: N/A;   REATTACHMENT LEFT INDEX FINGER  1988   SHOULDER ARTHROSCOPY WITH ROTATOR CUFF REPAIR AND SUBACROMIAL DECOMPRESSION Left 2004   THORACIC LAMINECTOMY FOR EPIDURAL ABSCESS N/A 12/18/2019   Procedure: THORACIC LAMINECTOMY FOR EPIDURAL ABSCESS Thoracic Two, Thoracic Three;  Surgeon: [name], MD;  Location: [facility];  Service: Neurosurgery;  Laterality: N/A;  THORACIC LAMINECTOMY FOR EPIDURAL ABSCESS Thoracic Two, Thoracic Three   TONSILLECTOMY  AS CHILD    Social History:  reports that he quit smoking about 12 years ago. His smoking use included cigarettes. He has a 12.00 pack-year smoking history. He has never used smokeless tobacco. He reports current alcohol use of about 3.0 standard drinks of alcohol per week. He reports that he does not use drugs.  Allergies  Allergen Reactions   Ceclor [Cefaclor] Rash   Sulfa Antibiotics Rash and Other (See Comments)    SEVERE RASH- childhood allergy    Family History  Problem Relation Age of Onset   Emphysema Mother    Cancer Father        bile duct     Prior to Admission medications   Medication Sig Start Date End Date Taking? Authorizing Provider  donepezil (ARICEPT) 10 MG tablet Take 1 tablet (10 mg total) by mouth at bedtime. 03/23/22  Yes Camara, Maryan Puls, MD  oxyCODONE (ROXICODONE) 15 MG immediate  release tablet Take 1 tablet (15 mg total) by mouth 5 (five) times daily as needed for pain. 01/20/22  Yes Gherghe, Vella Redhead, MD  QUEtiapine (SEROQUEL) 25 MG tablet TAKE 1 TABLET BY MOUTH EVERYDAY AT BEDTIME 07/17/22  Yes Camara, Amadou, MD  albuterol (PROAIR HFA) 108 (90 Base) MCG/ACT inhaler Inhale 2 puffs into the lungs every 6 (six) hours as needed for shortness of breath or wheezing. 05/18/22   Cobb, Karie Schwalbe, NP  Budeson-Glycopyrrol-Formoterol (BREZTRI AEROSPHERE) 160-9-4.8 MCG/ACT AERO Inhale 2 puffs into the lungs in the morning and at bedtime. 05/18/22   Cobb, Karie Schwalbe, NP  gabapentin (NEURONTIN) 300 MG capsule Take 1 capsule (300 mg total) by mouth 3 (three) times daily. Patient taking differently: Take 600 mg by mouth 3 (three) times daily. 12/25/19   Earlene Plater, MD  testosterone cypionate (DEPOTESTOSTERONE CYPIONATE) 200 MG/ML injection Inject 180 mg into the muscle every 14 (fourteen) days.  09/17/19   [provider]    Physical Exam: Vitals:   09/18/22 1200 09/18/22 1500 09/18/22 1637 09/18/22 2025  BP: 129/79 137/79 (!) 140/74 125/82  Pulse: 72 69 75 81  Resp: 14 (!) 21  20  Temp:   98.2 F (36.8 C) 98.6 F (37 C)  TempSrc:   Oral Oral  SpO2: 92% 95% 97% 97%  Weight:      Height:   _0  (1.702 m)    Constitutional: Resting in bed, NAD, calm, comfortable Eyes: EOMI, lids and conjunctivae normal ENMT: Mucous membranes are dry. Posterior pharynx clear of any exudate or lesions.Normal dentition.  Neck: normal, supple, no masses. Respiratory: clear to auscultation bilaterally, no wheezing, no crackles. Normal respiratory effort. No accessory muscle use.  Cardiovascular: Regular rate and rhythm, no murmurs / rubs / gallops. Abdomen: no tenderness, no masses palpated.  Musculoskeletal: no clubbing / cyanosis. No joint deformity upper and lower extremities. Good ROM, no contractures. Normal muscle tone.  Skin: no rashes, lesions, ulcers. No induration Neurologic:  Speech is slow but fluent, otherwise CN 2-12 grossly intact. Sensation intact. Strength equal bilaterally. Psychiatric: Alert and oriented x 3.  EKG: Personally reviewed. Sinus rhythm without acute ischemic changes.  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 male with medical history significant for Alzheimer's dementia, COPD, CKD stage IIIa, thoracic aortic aneurysm, BPH, chronic pain, history of choledocholithiasis S/p ERCP with sphincterotomy 04/14/2021 followed by stent removal 06/24/2021 who initially presented with confusion and right facial droop and is admitted for choledocholithiasis.  Assessment and Plan: * Choledocholithiasis History of prior recurrent choledocholithiasis with most recent ERCP with sphincterotomy April 2022 followed by stent removal July 2022 through [facility].  Has not had gallbladder removed.  LFTs elevated in the ED. CT A/P shows dilated CBD with likely choledocholithiasis.  Patient was placed on empiric IV antibiotics given leukocytosis.  EDP spoke with [facility]. -Obtain MRCP -Keep n.p.o. for now -Continue empiric IV ceftriaxone and Flagyl -Follow blood cultures -Continue IV fluid hydration overnight  Acute renal failure superimposed on stage 3a chronic kidney disease (HCC) Creatinine 1.70 on admission compared to prior baseline around 1.3.  Urine culture is no growth. CT A/P shows mild bilateral hydroureter with fullness of the left renal pelvis.  This may reflect residual of a recently passed calculus or reflect chronic changes of obstructive uropathy or reflux. -Continue IV  fluid hydration overnight -Monitor UOP -Continue Flomax  Acute encephalopathy Presented to ED with some confusion on background of mild dementia/memory impairment.  He seems to have returned to his baseline on admission.  Family noted right facial droop as well which has since resolved.  Confusion may delirium however given report of right facial droop CVA also in differential. -Obtain MRI brain -Continue neurochecks -Continue IV fluids and empiric antibiotics as above  COPD (chronic obstructive pulmonary disease) (HCC) Stable, no wheezing on admission. 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 -Delirium precautions -Continue Seroquel and Aricept  Chronic low back pain On chronic narcotics for many years. -Continue Oxy IR 15 mg q6h prn with hold parameters  DVT prophylaxis: heparin injection 5,000 Units Start: 09/18/22 2200 Code Status: Partial code (no chest compressions  or defibrillation).  Confirmed with patient and family on admission. Family Communication: Son and daughter-in-law at bedside Disposition Plan: From home, dispo pending clinical progress Consults called: GI Severity of Illness: The appropriate patient status for this patient is INPATIENT. Inpatient status is judged to be reasonable and necessary in order to provide the required intensity of service to ensure the patient's safety. The patient's presenting symptoms, physical exam findings, and initial radiographic and laboratory data in the context of their chronic comorbidities is felt to place them at high risk for further clinical deterioration. Furthermore, it is not anticipated that the patient will be medically stable for discharge from the hospital within 2 midnights of  admission.   * I certify that at the point of admission it is my clinical judgment that the patient will require inpatient hospital care spanning beyond 2 midnights from the point of admission due to high intensity of service, high risk for further deterioration and high frequency of surveillance required.Zada Finders MD [facility]  If 7PM-7AM, please contact night-coverage [email or web address]  09/18/2022, 9:31 PM

## 2022-09-18 NOTE — Hospital Course (Signed)
Kent Flowers is a 76 y.o. male with medical history significant for Alzheimer's dementia, COPD, CKD stage IIIa, thoracic aortic aneurysm, BPH, chronic pain, history of choledocholithiasis S/p ERCP with sphincterotomy 04/14/2021 followed by stent removal 06/24/2021 who initially presented with confusion and right facial droop and is admitted for choledocholithiasis.

## 2022-09-18 NOTE — Assessment & Plan Note (Signed)
On chronic narcotics for many years. -Continue Oxy IR 15 mg q6h prn with hold parameters

## 2022-09-18 NOTE — ED Notes (Signed)
RT note: Pts. Blood glucose level obtained via finger stick, RN made aware.

## 2022-09-18 NOTE — ED Notes (Signed)
Report given to [facility] 

## 2022-09-18 NOTE — ED Notes (Addendum)
[facility] for patient transfer call placed @ 3:10 pm

## 2022-09-18 NOTE — Assessment & Plan Note (Signed)
History of prior recurrent choledocholithiasis with most recent ERCP with sphincterotomy April 2022 followed by stent removal July 2022 through [place].  Has not had gallbladder removed.  LFTs elevated in the ED. CT A/P shows dilated CBD with likely choledocholithiasis.  Patient was placed on empiric IV antibiotics given leukocytosis.  EDP spoke with [facility]. -Obtain MRCP -Keep n.p.o. for now -Continue empiric IV ceftriaxone and Flagyl -Follow blood cultures -Continue IV fluid hydration overnight

## 2022-09-18 NOTE — Assessment & Plan Note (Signed)
Stable, no wheezing on admission. -Continue Breztri and albuterol as needed

## 2022-09-18 NOTE — Assessment & Plan Note (Signed)
Memory issues and slow speech for at least 6 months.  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 -Delirium precautions -Continue Seroquel and Aricept

## 2022-09-18 NOTE — ED Notes (Signed)
Report sent to nurse receiving patient on [facility], via Secure Chat. Nurse had not opened chat and this RN called [facility]. Notified that [facility] is on the way to them and that report had been sent 30 minutes ago. Director aware and nurse was at lunch. Stated she "would let her know". [facility] also notified that report attempted x2. Leanne Chang, RN

## 2022-09-18 NOTE — Assessment & Plan Note (Signed)
Creatinine 1.70 on admission compared to prior baseline around 1.3.  Urine culture is no growth. CT A/P shows mild bilateral hydroureter with fullness of the left renal pelvis.  This may reflect residual of a recently passed calculus or reflect chronic changes of obstructive uropathy or reflux. -Continue IV fluid hydration overnight -Monitor UOP -Continue Flomax

## 2022-09-18 NOTE — ED Notes (Signed)
Pt able to follow commands and conversation. However, pt is slow with speech and a little slow to respond to questions.

## 2022-09-18 NOTE — ED Notes (Signed)
Kris Mouton is the accepting hospitalist per [facility]

## 2022-09-19 ENCOUNTER — Inpatient Hospital Stay (HOSPITAL_COMMUNITY): Payer: Medicare Other

## 2022-09-19 DIAGNOSIS — K8050 Calculus of bile duct without cholangitis or cholecystitis without obstruction: Secondary | ICD-10-CM | POA: Diagnosis not present

## 2022-09-19 LAB — COMPREHENSIVE METABOLIC PANEL
ALT: 35 U/L (ref 0–44)
AST: 26 U/L (ref 15–41)
Albumin: 3.2 g/dL — ABNORMAL LOW (ref 3.5–5.0)
Alkaline Phosphatase: 125 U/L (ref 38–126)
Anion gap: 11 (ref 5–15)
BUN: 23 mg/dL (ref 8–23)
CO2: 24 mmol/L (ref 22–32)
Calcium: 8.5 mg/dL — ABNORMAL LOW (ref 8.9–10.3)
Chloride: 104 mmol/L (ref 98–111)
Creatinine, Ser: 1.66 mg/dL — ABNORMAL HIGH (ref 0.61–1.24)
GFR, Estimated: 43 mL/min — ABNORMAL LOW (ref >60)
Glucose, Bld: 96 mg/dL (ref 70–99)
Potassium: 4.1 mmol/L (ref 3.5–5.1)
Sodium: 139 mmol/L (ref 135–145)
Total Bilirubin: 0.8 mg/dL (ref 0.3–1.2)
Total Protein: 6.2 g/dL — ABNORMAL LOW (ref 6.5–8.1)

## 2022-09-19 LAB — CBC
HCT: 46.6 % (ref 39.0–52.0)
Hemoglobin: 15.6 g/dL (ref 13.0–17.0)
MCH: 30.9 pg (ref 26.0–34.0)
MCHC: 33.5 g/dL (ref 30.0–36.0)
MCV: 92.3 fL (ref 80.0–100.0)
Platelets: 218 10*3/uL (ref 150–400)
RBC: 5.05 MIL/uL (ref 4.22–5.81)
RDW: 13.9 % (ref 11.5–15.5)
WBC: 10.4 10*3/uL (ref 4.0–10.5)
nRBC: 0 % (ref 0.0–0.2)

## 2022-09-19 NOTE — Consult Note (Addendum)
[facility] Consult: 8:12 AM 09/19/2022  LOS: 1 day    Referring Provider: Zada [facility].    Primary Care Physician:  Carylon Perches, NP  [place].   Primary Gastroenterologist: [facility].  Dr. Donney Rankins, Dr Willadean Carol.   Also [facility].      Reason for Consultation:  Choledocholithiasis, resolved elevation LFTs.     HPI: Kent Flowers is a 76 y.o. male.  PMH dementia, COPD, CKD 3b, thoracic aortic aneurysm, BPH, Penile sq cell cancer/carcinoma in situ 2014, chronic pain/chronic opioids, hearing loss, B 12 deficiency.  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  Iron  deficiency anemia.  Unclear if prior Colonoscopy.   Choledocholithiasis initially diagnosed 2021.   MRCP 09/2020 with obstructing stone at distal bile duct.  Duct measured 1.9 cm.  There was both intra and extrahepatic ductal dilatation.  Bil hydroureteronephrosis w/O obstruction.   03/2021 abd ultrasound at [facility] with ultrasound showing dilated intra, extrahepatic ducts.  ERCP/EUS with sphinct/stent 04/14/2021: EUS: Pancreatic parenchyma with normal "salt-and-pepper" appearance 2.4 mm maximal PD diameter at head of pancreas, 2 mm in body of pancreas.  PV, splenic vein, porta splenic confluence all normal.  SMV, SMA normal.  CBD maximum diameter 14 mm, at least 2 stones seen in CBD. ERCP: Underwent biliary sphincterotomy and balloon sweep removal of stones.  A 10 French, 7 cm Cotton-Leung biliary stent placed to bile duct. Stent removed 06/24/2021 Patient's son and daughter-in-law state that he was not set up for cholecystectomy and their understanding was of a wait and see approach regarding gallbladder removal.   Presented to ED 2 d ago for eval increased confusion over baseline, facial droop.   Abd/epig pain several d prior that had resolved.  No N/V, alterred stool pattern, sweats, chills, fever, anorexia.  Vitals were stable and remain so.  Sats in upper 90s on 3 L [place] O2.     Creat 1.7, baseline 1.3.   T bili 1.7.  alk phos 196.  AST/ALT 64/66.  The LFTs have normalized overnight.  Ammonia 34.   WBC 16.7, normalized overnight.   Urine w ++ RBCs, +++ WBCs, rare bacteria. Lactate not elevated. CT head negative.  MR brain: Moderate chronic microvascular ischemic changes.  Moderate parenchymal volume loss.  No acute intracranial abnormalities.  Stable type II odontoid fracture. CTAP w contrast: CBD 1.7 cm with debris at distal CBD.  Gallbladder, liver, pancreas unremarkable.  Punctate calcifications and subcentimeter hypodensity TSTC in spleen.  Scattered colon tics.  Severe calcific and noncalcific plaque in the aorta and branches.  3.8 x 2.1 cm aortocaval density at level of aortic bifurcation.  Multilevel degenerative spine changes.  Nail fixation present in old right femoral fracture. MRCP: 1.9 x 0.8 cm filling defect at distal CBD consistent with choledocholithiasis.  Left hydronephrosis, hydroureter slightly increased compared with previous CT but no visualization of obstructing stone.  Denies recurrent abdominal pain.  Has not had any nausea, vomiting.  No anorexia and he is very hungry as its been 2 or 3  days without any significant meals.  No family history of liver disease, colon cancer, IBD.  Father had cholangiocarcinoma. Patient says he has been widowed for about 5 months.  Says he got married in 1978.  4 children.  Still living alone in a 4 bedroom house.  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  Consumes about 1/5 of bourbon every 7 to 10 days but sometimes 1/5 will last him longer than that.  Smoking several months ago.  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   Wears glasses     Past Surgical History:  Procedure Laterality Date   CYSTOSCOPY WITH BIOPSY N/A 04/10/2017   Procedure: POSSIBLE BLADDER  BIOPSY;  Surgeon: Festus Aloe, MD;  Location: [facility];  Service: Urology;  Laterality: N/A;   CYSTOSCOPY WITH URETHRAL DILATATION N/A 04/10/2017   Procedure: CYSTOSCOPY WITH BALLOON URETHRALSTRICTURE DILATATION;  Surgeon: Festus Aloe, MD;  Location[facility];  Service: Urology;  Laterality: N/A;   PENILE BIOPSY N/A 03/14/2013   Procedure: PENILE BIOPSY;  Surgeon: Fredricka Bonine, MD;  Location: [facility];  Service: Urology;  Laterality: N/A;   REATTACHMENT LEFT INDEX FINGER  1988   SHOULDER ARTHROSCOPY WITH ROTATOR CUFF REPAIR AND SUBACROMIAL DECOMPRESSION Left 2004   THORACIC LAMINECTOMY FOR EPIDURAL ABSCESS N/A 12/18/2019   Procedure: THORACIC LAMINECTOMY FOR EPIDURAL ABSCESS Thoracic Two, Thoracic Three;  Surgeon: [name], MD;   Location: [employer] [facility];  Service: Neurosurgery;  Laterality: N/A;  THORACIC LAMINECTOMY FOR EPIDURAL ABSCESS Thoracic Two, Thoracic Three   TONSILLECTOMY  AS CHILD    Prior to Admission medications   Medication Sig Start Date End Date Taking? Authorizing Provider  albuterol (PROAIR HFA) 108 (90 Base) MCG/ACT inhaler Inhale 2 puffs into the lungs every 6 (six) hours as needed for shortness of breath or wheezing. 05/18/22  Yes Cobb, Karie Schwalbe, NP  Budeson-Glycopyrrol-Formoterol (BREZTRI AEROSPHERE) 160-9-4.8 MCG/ACT AERO Inhale 2 puffs into the lungs in the morning and at bedtime. 05/18/22  Yes Cobb, Karie Schwalbe, NP  donepezil (ARICEPT) 10 MG tablet Take 1 tablet (10 mg total) by mouth at bedtime. 03/23/22  Yes Camara, Maryan Puls, MD  oxyCODONE (ROXICODONE) 15 MG immediate release tablet Take 1 tablet (15 mg total) by mouth 5 (five) times daily as  needed for pain. 01/20/22  Yes Gherghe, Vella Redhead, MD  QUEtiapine (SEROQUEL) 25 MG tablet TAKE 1 TABLET BY MOUTH EVERYDAY AT BEDTIME Patient taking differently: Take 25 mg by mouth at bedtime. 07/17/22  Yes Camara, Maryan Puls, MD  tamsulosin (FLOMAX) 0.4 MG CAPS capsule Take 0.4 mg by mouth at bedtime. 09/04/22  Yes [provider]  testosterone cypionate (DEPOTESTOSTERONE CYPIONATE) 200 MG/ML injection Inject 180 mg into the muscle every 14 (fourteen) days.  09/17/19  Yes [provider]    Scheduled Meds:  donepezil  10 mg Oral QHS   fluticasone furoate-vilanterol  1 puff Inhalation Daily   heparin  5,000 Units Subcutaneous Q8H   QUEtiapine  25 mg Oral QHS   sodium chloride flush  3 mL Intravenous Q12H   tamsulosin  0.4 mg Oral QHS   umeclidinium bromide  1 puff Inhalation Daily   Infusions:  cefTRIAXone (ROCEPHIN)  IV Stopped (09/18/22 2319)   lactated ringers 125 mL/hr at 09/18/22 2318   metronidazole 500 mg (09/19/22 0546)   PRN Meds: albuterol, ondansetron **OR** ondansetron (ZOFRAN) IV, oxyCODONE   Allergies as of 09/17/2022 - Review  Complete 09/17/2022  Allergen Reaction Noted   Ceclor [cefaclor] Rash 01/24/2012   Sulfa antibiotics Rash and Other (See Comments) 01/24/2012    Family History  Problem Relation Age of Onset   Emphysema Mother    Cancer Father        bile duct    Social History   Socioeconomic History   Marital status: Married    Spouse name: Arihant Pennings   Number of children: 4   Years of education: 12 +   Highest education level: Associate degree: occupational, Hotel manager, or vocational program  Occupational History   Occupation: Energy manager: [employer]  Tobacco Use   Smoking status: Former    Packs/day: 1.00    Years: 12.00    Total pack years: 12.00    Types: Cigarettes    Quit date: 12/18/2009    Years since quitting: 12.7   Smokeless tobacco: Never  Vaping Use   Vaping Use: Never used  Substance and Sexual Activity   Alcohol use: Yes    Alcohol/week: 3.0 standard drinks of alcohol    Types: 3 Glasses of wine per week    Comment: sometimes    Drug use: No   Sexual activity: Not Currently  Other Topics Concern   Not on file  Social History Narrative   Not on file   Social Determinants of Health   Financial Resource Strain: Low Risk  (03/02/2020)   Overall Financial Resource Strain (CARDIA)    Difficulty of Paying Living Expenses: Not very hard  Food Insecurity: Food Insecurity Present (03/02/2020)   Hunger Vital Sign    Worried About Running Out of Food in the Last Year: Sometimes true    Ran Out of Food in the Last Year: Sometimes true  Transportation Needs: No Transportation Needs (03/02/2020)   PRAPARE - Hydrologist (Medical): No    Lack of Transportation (Non-Medical): No  Physical Activity: Inactive (03/02/2020)   Exercise Vital Sign    Days of Exercise per Week: 0 days    Minutes of Exercise per Session: 0 min  Stress: Stress Concern Present (03/02/2020)   [place]    Feeling of Stress : To some extent  Social Connections: Socially Integrated (03/02/2020)   Social Connection and Isolation  Panel [NHANES]    Frequency of Communication with Friends and Family: More than three times a week    Frequency of Social Gatherings with Friends and Family: More than three times a week    Attends Religious Services: More than 4 times per year    Active Member of [employer] or Organizations: Yes    Attends Archivist Meetings: More than 4 times per year    Marital Status: Married  Human resources officer Violence: Not At Risk (03/02/2020)   Humiliation, Afraid, Rape, and Kick questionnaire    Fear of Current or Ex-Partner: No    Emotionally Abused: No    Physically Abused: No    Sexually Abused: No    REVIEW OF SYSTEMS: Constitutional: No weakness, no fatigue. ENT:  No nose bleeds.  Some hearing loss Pulm: Denies dyspnea.  Chronic cough, sometimes productive. CV:  No palpitations, no LE edema.  GU:  No hematuria, no frequency GI: See HPI. Heme: No unusual bleeding or bruising. Transfusions: None Neuro:  No headaches, no peripheral tingling or numbness.  No syncope, no seizures.  No loss of balance. Derm:  No itching, no rash or sores.  Endocrine:  No sweats or chills.  No polyuria or dysuria Immunization: Reviewed. Travel:  None beyond local counties in last few months.    PHYSICAL EXAM: Vital signs in last 24 hours: Vitals:   09/18/22 2324 09/19/22 0433  BP: 119/74 (!) 140/77  Pulse: 87 70  Resp: 18 16  Temp: 98.9 F (37.2 C) 98.7 F (37.1 C)  SpO2: 97% 99%   Wt Readings from Last 3 Encounters:  09/17/22 72.8 kg  06/12/22 72.8 kg  05/18/22 68 kg    General: Pleasant, alert.  Some word finding problems but not overtly confused.  Does not look ill. Head: No signs of head trauma.  No facial asymmetry. Eyes: No scleral icterus.  No conjunctival pallor.  EOMI. Ears: Slightly hard of hearing Nose: No congestion, no  discharge. Mouth: Dentulous.  Upper dentures in place.  No dentures on lower jaw.  Tongue midline.  Mucosa pink, moist, clear. Neck: No thyromegaly, masses, JVD. Lungs: Diminished but clear breath sounds.  Moderately loose cough.  No dyspnea at rest or with speech. Heart: RRR.  No MRG.  S1, S2 present Abdomen: Soft.  Not distended or tender.  Bowel sounds active.  No HSM, bruits, hernias..   Rectal: Deferred Musc/Skeltl: No swelling.  Loss of musculature and deformity of the right lower arm and hand consistent with his nerve injury. Extremities: No peripheral edema. Neurologic: Moves all 4 limbs, strength not tested.  No visible tremors. Skin: Changes consistent with lifelong sun exposure but no concerning/suspicious lesions.  No open sores. Tattoos: None observed Nodes: No cervical adenopathy Psych: Cooperative, calm, pleasant.  Intake/Output from previous day: 10/02 0701 - 10/03 0700 In: 555.9 [IV Piggyback:555.9] Out: 550 [Urine:550] Intake/Output this shift: No intake/output data recorded.  LAB RESULTS: Recent Labs    09/17/22 1225 09/17/22 1424 09/19/22 0432  WBC 16.7*  --  10.4  HGB 16.2 16.0 15.6  HCT 49.5 47.0 46.6  PLT 308  --  218   BMET Lab Results  Component Value Date   NA 139 09/19/2022   NA 137 09/17/2022   NA 137 09/17/2022   K 4.1 09/19/2022   K 4.1 09/17/2022   K 4.4 09/17/2022   CL 104 09/19/2022   CL 101 09/17/2022   CL 106 01/20/2022   CO2 24 09/19/2022   CO2  27 09/17/2022   CO2 26 01/20/2022   GLUCOSE 96 09/19/2022   GLUCOSE 116 (H) 09/17/2022   GLUCOSE 103 (H) 01/20/2022   BUN 23 09/19/2022   BUN 27 (H) 09/17/2022   BUN 17 01/20/2022   CREATININE 1.66 (H) 09/19/2022   CREATININE 1.70 (H) 09/17/2022   CREATININE 1.36 (H) 01/20/2022   CALCIUM 8.5 (L) 09/19/2022   CALCIUM 10.0 09/17/2022   CALCIUM 8.6 (L) 01/20/2022   LFT Recent Labs    09/17/22 1225 09/19/22 0432  PROT 7.8 6.2*  ALBUMIN 4.5 3.2*  AST 64* 26  ALT 66* 35   ALKPHOS 196* 125  BILITOT 1.7* 0.8   PT/INR Lab Results  Component Value Date   INR 1.0 09/17/2022   INR 1.3 (H) 12/18/2019   Hepatitis Panel No results for input(s): "HEPBSAG", "HCVAB", "HEPAIGM", "HEPBIGM" in the last 72 hours. C-Diff No components found for: "CDIFF" Lipase  No results found for: "LIPASE"  Drugs of Abuse     Component Value Date/Time   LABOPIA POSITIVE (A) 11/07/2019 0231   COCAINSCRNUR NONE DETECTED 11/07/2019 0231   LABBENZ NONE DETECTED 11/07/2019 0231   AMPHETMU NONE DETECTED 11/07/2019 0231   THCU POSITIVE (A) 11/07/2019 0231   LABBARB NONE DETECTED 11/07/2019 0231     RADIOLOGY STUDIES: CT ABDOMEN PELVIS W CONTRAST  Result Date: 09/17/2022 CLINICAL DATA:  Sepsis Abdominal pain, acute, nonlocalized EXAM: CT ABDOMEN AND PELVIS WITH CONTRAST TECHNIQUE: Multidetector CT imaging of the abdomen and pelvis was performed using the standard protocol following bolus administration of intravenous contrast. RADIATION DOSE REDUCTION: This exam was performed according to the departmental dose-optimization program which includes automated exposure control, adjustment of the mA and/or kV according to patient size and/or use of iterative reconstruction technique. CONTRAST:  156m OMNIPAQUE IOHEXOL 300 MG/ML  SOLN COMPARISON:  CT abdomen pelvis 02/13/2017, PET CT 02/11/2020 FINDINGS: Lower chest: No acute abnormality.  Coronary artery calcification. Hepatobiliary: No focal liver abnormality. No gallstones, gallbladder wall thickening, or pericholecystic fluid. The common bile duct is dilated measuring up to 1.7 cm. Debris is noted within the distal common bile duct (5:56). Pancreas: No focal lesion. Normal pancreatic contour. No surrounding inflammatory changes. No main pancreatic ductal dilatation. Spleen: Normal in size. Punctate calcifications. Subcentimeter hypodensity too small to characterize. There is an indeterminate 1.6 cm in hypodensity. Adrenals/Urinary Tract: No  adrenal nodule bilaterally. Bilateral kidneys enhance symmetrically. No hydronephrosis. Mild bilateral hydroureter with fullness of the left renal pelvis. No nephroureterolithiasis. The urinary bladder is unremarkable. Stomach/Bowel: Stomach is within normal limits. No evidence of bowel wall thickening or dilatation. Few scattered colonic diverticula. Appendix appears normal. Vascular/Lymphatic: No abdominal aorta or iliac aneurysm. Severe calcified and noncalcified atherosclerotic plaque of the aorta and its branches. There is a aortocaval 3.8 x 2.1 cm density at the level of the aortic bifurcation. No abdominal, pelvic, or inguinal lymphadenopathy. Reproductive: Prostate is unremarkable. Other: No intraperitoneal free fluid. No intraperitoneal free gas. No organized fluid collection. Musculoskeletal: No abdominal wall hernia or abnormality. No suspicious lytic or blastic osseous lesions. No acute displaced fracture. Multilevel degenerative changes of the spine. Old healed right femoral fracture with intramedullary nail fixation partially visualized. IMPRESSION: 1. Dilated common bile duct with likely choledocholithiasis given debris noted within the distal common bile duct. Differential diagnosis includes intraluminal mass. Recommend MRCP further evaluation. 2. Mild bilateral hydroureter with fullness of the left renal pelvis. This may reflect the residual of a recently passed calculus or reflect chronic changes of obstructive uropathy or reflux. 3.  Indeterminate aortocaval 3.8 x 2.1 cm density at the level of the aortic bifurcation. Recommend attention on follow-up in the setting of prior malignancy. 4. Colonic diverticulosis with no acute diverticulitis. 5.  Aortic Atherosclerosis (ICD10-I70.0). Electronically Signed   By: Iven Finn M.D.   On: 09/17/2022 16:35   DG Chest 2 View  Result Date: 09/17/2022 CLINICAL DATA:  Cough EXAM: CHEST - 2 VIEW COMPARISON:  None Available. FINDINGS: Normal cardiac  silhouette. Low lung volumes. Chronic bronchitic markings. Degenerative osteophytosis of the spine. IMPRESSION: 1. No significant change from prior. 2. Chronic bronchitic markings. No clear acute cardiopulmonary findings. Electronically Signed   By: Suzy Bouchard M.D.   On: 09/17/2022 16:11   CT HEAD WO CONTRAST  Result Date: 09/17/2022 CLINICAL DATA:  Confusion.  Right facial droop today. EXAM: CT HEAD WITHOUT CONTRAST TECHNIQUE: Contiguous axial images were obtained from the base of the skull through the vertex without intravenous contrast. RADIATION DOSE REDUCTION: This exam was performed according to the departmental dose-optimization program which includes automated exposure control, adjustment of the mA and/or kV according to patient size and/or use of iterative reconstruction technique. COMPARISON:  01/18/2022. FINDINGS: Brain: No evidence of acute infarction, hemorrhage, hydrocephalus, extra-axial collection or mass lesion/mass effect. Mild patchy periventricular white matter hypoattenuation consistent with chronic microvascular ischemic change. Stable. Vascular: No hyperdense vessel or unexpected calcification. Skull: Normal. Negative for fracture or focal lesion. Sinuses/Orbits: Globes and orbits are unremarkable. Mild scattered mucosal thickening, inferior frontal and bilateral ethmoid sinuses, minimally in the maxillary sinuses. Other: None. IMPRESSION: 1. No acute intracranial abnormalities. Electronically Signed   By: Lajean Manes M.D.   On: 09/17/2022 13:01      IMPRESSION:    Recurrent choledocholithiasis in pt w choledocholithiasis late 2021.  S/p ERCP/EUS/sphincterotomy/stone removal/biliary stent placement 03/2021.  Biliary stent removed 06/2021. Abx coverage w Rocephin, Metronidazole.  Note prior rash, low severity reaction to Ceclor in alergy list.     Pyuria, hematuria, scant bacteruria.  Urine clx pndg.        AMS,  baseline dementia.    Currently his dementia does not seem  severe.  Family reports progressive memory and functional issues over several months.    Opiate dependent chronic abd pain.      PLAN:       ERCP tomorrow afternoon.  Ordered regular diet for today.  Continue the Rocephin and Flagyl.  plans/procedure details/risks with his son Jasmin Trumbull.  [phone].  And pts dtr in law.  They will be coming to the hospital and signed the consent when they get here.  Patient's son and daughter-in-law asked if the patient could get a neurology consult because his mental status has been in a more pronounced decline for several months.  Obtain general surgery consult.  Patient does not seem to be superhigh risk for laparoscopic cholecystectomy and this is his second bout with choledocholithiasis.   Azucena Freed  09/19/2022, 8:12 AM Phone [phone]

## 2022-09-19 NOTE — H&P (View-Only) (Signed)
                                                                           [facility] Consult: 8:12 AM 09/19/2022  LOS: 1 day    Referring Provider: Vishal Patel MC.    Primary Care Physician:  Ohenhen, Pat, NP  [facility].   Primary Gastroenterologist: [facility].  Dr. John Herhily, Dr Scott Coleman.   Also [facility].      Reason for Consultation:  Choledocholithiasis, resolved elevation LFTs.     HPI: Kent Flowers is a 75 y.o. male.  PMH dementia, COPD, CKD 3b, thoracic aortic aneurysm, BPH, Penile sq cell cancer/carcinoma in situ 2014, chronic pain/chronic opioids, hearing loss, B 12 deficiency.  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  Iron  deficiency anemia.  Unclear if prior Colonoscopy.   Choledocholithiasis initially diagnosed 2021.   MRCP 09/2020 with obstructing stone at distal bile duct.  Duct measured 1.9 cm.  There was both intra and extrahepatic ductal dilatation.  Bil hydroureteronephrosis w/O obstruction.   03/2021 abd ultrasound at [facility] with ultrasound showing dilated intra, extrahepatic ducts.  ERCP/EUS with sphinct/stent 04/14/2021: EUS: Pancreatic parenchyma with normal "salt-and-pepper" appearance 2.4 mm maximal PD diameter at head of pancreas, 2 mm in body of pancreas.  PV, splenic vein, porta splenic confluence all normal.  SMV, SMA normal.  CBD maximum diameter 14 mm, at least 2 stones seen in CBD. ERCP: Underwent biliary sphincterotomy and balloon sweep removal of stones.  A 10 French, 7 cm Cotton-Leung biliary stent placed to bile duct. Stent removed 06/24/2021 Patient's son and daughter-in-law state that he was not set up for cholecystectomy and their understanding was of a wait and see approach regarding gallbladder removal.   Presented to ED 2 d ago for eval increased confusion over baseline, facial droop.   Abd/epig pain several d prior that had resolved.  No N/V, alterred stool pattern, sweats, chills, fever, anorexia.  Vitals were stable and remain so.  Sats in upper 90s on 3 L [place] O2.     Creat 1.7, baseline 1.3.   T bili 1.7.  alk phos 196.  AST/ALT 64/66.  The LFTs have normalized overnight.  Ammonia 34.   WBC 16.7, normalized overnight.   Urine w ++ RBCs, +++ WBCs, rare bacteria. Lactate not elevated. CT head negative.  MR brain: Moderate chronic microvascular ischemic changes.  Moderate parenchymal volume loss.  No acute intracranial abnormalities.  Stable type II odontoid fracture. CTAP w contrast: CBD 1.7 cm with debris at distal CBD.  Gallbladder, liver, pancreas unremarkable.  Punctate calcifications and subcentimeter hypodensity TSTC in spleen.  Scattered colon tics.  Severe calcific and noncalcific plaque in the aorta and branches.  3.8 x 2.1 cm aortocaval density at level of aortic bifurcation.  Multilevel degenerative spine changes.  Nail fixation present in old right femoral fracture. MRCP: 1.9 x 0.8 cm filling defect at distal CBD consistent with choledocholithiasis.  Left hydronephrosis, hydroureter slightly increased compared with previous CT but no visualization of obstructing stone.  Denies recurrent abdominal pain.  Has not had any nausea, vomiting.  No anorexia and he is very hungry as its been 2 or 3   days without any significant meals.  No family history of liver disease, colon cancer, IBD.  Father had cholangiocarcinoma. Patient says he has been widowed for about 5 months.  Says he got married in 1978.  4 children.  Still living alone in a 4 bedroom house.  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  Consumes about 1/5 of bourbon every 7 to 10 days but sometimes 1/5 will last him longer than that.  Smoking several months ago.  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   Wears glasses     Past Surgical History:  Procedure Laterality Date   CYSTOSCOPY WITH BIOPSY N/A 04/10/2017   Procedure: POSSIBLE BLADDER  BIOPSY;  Surgeon: Festus Aloe, MD;  Location: [facility];  Service: Urology;  Laterality: N/A;   CYSTOSCOPY WITH URETHRAL DILATATION N/A 04/10/2017   Procedure: CYSTOSCOPY WITH BALLOON URETHRALSTRICTURE DILATATION;  Surgeon: Festus Aloe, MD;  Location[facility];  Service: Urology;  Laterality: N/A;   PENILE BIOPSY N/A 03/14/2013   Procedure: PENILE BIOPSY;  Surgeon: Fredricka Bonine, MD;  Location: [facility];  Service: Urology;  Laterality: N/A;   REATTACHMENT LEFT INDEX FINGER  1988   SHOULDER ARTHROSCOPY WITH ROTATOR CUFF REPAIR AND SUBACROMIAL DECOMPRESSION Left 2004   THORACIC LAMINECTOMY FOR EPIDURAL ABSCESS N/A 12/18/2019   Procedure: THORACIC LAMINECTOMY FOR EPIDURAL ABSCESS Thoracic Two, Thoracic Three;  Surgeon: [name], MD;   Location: [employer] [facility];  Service: Neurosurgery;  Laterality: N/A;  THORACIC LAMINECTOMY FOR EPIDURAL ABSCESS Thoracic Two, Thoracic Three   TONSILLECTOMY  AS CHILD    Prior to Admission medications   Medication Sig Start Date End Date Taking? Authorizing Provider  albuterol (PROAIR HFA) 108 (90 Base) MCG/ACT inhaler Inhale 2 puffs into the lungs every 6 (six) hours as needed for shortness of breath or wheezing. 05/18/22  Yes Cobb, Karie Schwalbe, NP  Budeson-Glycopyrrol-Formoterol (BREZTRI AEROSPHERE) 160-9-4.8 MCG/ACT AERO Inhale 2 puffs into the lungs in the morning and at bedtime. 05/18/22  Yes Cobb, Karie Schwalbe, NP  donepezil (ARICEPT) 10 MG tablet Take 1 tablet (10 mg total) by mouth at bedtime. 03/23/22  Yes Camara, Maryan Puls, MD  oxyCODONE (ROXICODONE) 15 MG immediate release tablet Take 1 tablet (15 mg total) by mouth 5 (five) times daily as  needed for pain. 01/20/22  Yes Gherghe, Vella Redhead, MD  QUEtiapine (SEROQUEL) 25 MG tablet TAKE 1 TABLET BY MOUTH EVERYDAY AT BEDTIME Patient taking differently: Take 25 mg by mouth at bedtime. 07/17/22  Yes Camara, Maryan Puls, MD  tamsulosin (FLOMAX) 0.4 MG CAPS capsule Take 0.4 mg by mouth at bedtime. 09/04/22  Yes [provider]  testosterone cypionate (DEPOTESTOSTERONE CYPIONATE) 200 MG/ML injection Inject 180 mg into the muscle every 14 (fourteen) days.  09/17/19  Yes [provider]    Scheduled Meds:  donepezil  10 mg Oral QHS   fluticasone furoate-vilanterol  1 puff Inhalation Daily   heparin  5,000 Units Subcutaneous Q8H   QUEtiapine  25 mg Oral QHS   sodium chloride flush  3 mL Intravenous Q12H   tamsulosin  0.4 mg Oral QHS   umeclidinium bromide  1 puff Inhalation Daily   Infusions:  cefTRIAXone (ROCEPHIN)  IV Stopped (09/18/22 2319)   lactated ringers 125 mL/hr at 09/18/22 2318   metronidazole 500 mg (09/19/22 0546)   PRN Meds: albuterol, ondansetron **OR** ondansetron (ZOFRAN) IV, oxyCODONE   Allergies as of 09/17/2022 - Review  Complete 09/17/2022  Allergen Reaction Noted   Ceclor [cefaclor] Rash 01/24/2012   Sulfa antibiotics Rash and Other (See Comments) 01/24/2012    Family History  Problem Relation Age of Onset   Emphysema Mother    Cancer Father        bile duct    Social History   Socioeconomic History   Marital status: Married    Spouse name: Kent Flowers   Number of children: 4   Years of education: 12 +   Highest education level: Associate degree: occupational, Hotel manager, or vocational program  Occupational History   Occupation: Energy manager: [employer]  Tobacco Use   Smoking status: Former    Packs/day: 1.00    Years: 12.00    Total pack years: 12.00    Types: Cigarettes    Quit date: 12/18/2009    Years since quitting: 12.7   Smokeless tobacco: Never  Vaping Use   Vaping Use: Never used  Substance and Sexual Activity   Alcohol use: Yes    Alcohol/week: 3.0 standard drinks of alcohol    Types: 3 Glasses of wine per week    Comment: sometimes    Drug use: No   Sexual activity: Not Currently  Other Topics Concern   Not on file  Social History Narrative   Not on file   Social Determinants of Health   Financial Resource Strain: Low Risk  (03/02/2020)   Overall Financial Resource Strain (CARDIA)    Difficulty of Paying Living Expenses: Not very hard  Food Insecurity: Food Insecurity Present (03/02/2020)   Hunger Vital Sign    Worried About Running Out of Food in the Last Year: Sometimes true    Ran Out of Food in the Last Year: Sometimes true  Transportation Needs: No Transportation Needs (03/02/2020)   PRAPARE - Hydrologist (Medical): No    Lack of Transportation (Non-Medical): No  Physical Activity: Inactive (03/02/2020)   Exercise Vital Sign    Days of Exercise per Week: 0 days    Minutes of Exercise per Session: 0 min  Stress: Stress Concern Present (03/02/2020)   [place]    Feeling of Stress : To some extent  Social Connections: Socially Integrated (03/02/2020)   Social Connection and Isolation  Panel [NHANES]    Frequency of Communication with Friends and Family: More than three times a week    Frequency of Social Gatherings with Friends and Family: More than three times a week    Attends Religious Services: More than 4 times per year    Active Member of Clubs or Organizations: Yes    Attends Club or Organization Meetings: More than 4 times per year    Marital Status: Married  Intimate Partner Violence: Not At Risk (03/02/2020)   Humiliation, Afraid, Rape, and Kick questionnaire    Fear of Current or Ex-Partner: No    Emotionally Abused: No    Physically Abused: No    Sexually Abused: No    REVIEW OF SYSTEMS: Constitutional: No weakness, no fatigue. ENT:  No nose bleeds.  Some hearing loss Pulm: Denies dyspnea.  Chronic cough, sometimes productive. CV:  No palpitations, no LE edema.  GU:  No hematuria, no frequency GI: See HPI. Heme: No unusual bleeding or bruising. Transfusions: None Neuro:  No headaches, no peripheral tingling or numbness.  No syncope, no seizures.  No loss of balance. Derm:  No itching, no rash or sores.  Endocrine:  No sweats or chills.  No polyuria or dysuria Immunization: Reviewed. Travel:  None beyond local counties in last few months.    PHYSICAL EXAM: Vital signs in last 24 hours: Vitals:   09/18/22 2324 09/19/22 0433  BP: 119/74 (!) 140/77  Pulse: 87 70  Resp: 18 16  Temp: 98.9 F (37.2 C) 98.7 F (37.1 C)  SpO2: 97% 99%   Wt Readings from Last 3 Encounters:  09/17/22 72.8 kg  06/12/22 72.8 kg  05/18/22 68 kg    General: Pleasant, alert.  Some word finding problems but not overtly confused.  Does not look ill. Head: No signs of head trauma.  No facial asymmetry. Eyes: No scleral icterus.  No conjunctival pallor.  EOMI. Ears: Slightly hard of hearing Nose: No congestion, no  discharge. Mouth: Dentulous.  Upper dentures in place.  No dentures on lower jaw.  Tongue midline.  Mucosa pink, moist, clear. Neck: No thyromegaly, masses, JVD. Lungs: Diminished but clear breath sounds.  Moderately loose cough.  No dyspnea at rest or with speech. Heart: RRR.  No MRG.  S1, S2 present Abdomen: Soft.  Not distended or tender.  Bowel sounds active.  No HSM, bruits, hernias..   Rectal: Deferred Musc/Skeltl: No swelling.  Loss of musculature and deformity of the right lower arm and hand consistent with his nerve injury. Extremities: No peripheral edema. Neurologic: Moves all 4 limbs, strength not tested.  No visible tremors. Skin: Changes consistent with lifelong sun exposure but no concerning/suspicious lesions.  No open sores. Tattoos: None observed Nodes: No cervical adenopathy Psych: Cooperative, calm, pleasant.  Intake/Output from previous day: 10/02 0701 - 10/03 0700 In: 555.9 [IV Piggyback:555.9] Out: 550 [Urine:550] Intake/Output this shift: No intake/output data recorded.  LAB RESULTS: Recent Labs    09/17/22 1225 09/17/22 1424 09/19/22 0432  WBC 16.7*  --  10.4  HGB 16.2 16.0 15.6  HCT 49.5 47.0 46.6  PLT 308  --  218   BMET Lab Results  Component Value Date   NA 139 09/19/2022   NA 137 09/17/2022   NA 137 09/17/2022   K 4.1 09/19/2022   K 4.1 09/17/2022   K 4.4 09/17/2022   CL 104 09/19/2022   CL 101 09/17/2022   CL 106 01/20/2022   CO2 24 09/19/2022   CO2   27 09/17/2022   CO2 26 01/20/2022   GLUCOSE 96 09/19/2022   GLUCOSE 116 (H) 09/17/2022   GLUCOSE 103 (H) 01/20/2022   BUN 23 09/19/2022   BUN 27 (H) 09/17/2022   BUN 17 01/20/2022   CREATININE 1.66 (H) 09/19/2022   CREATININE 1.70 (H) 09/17/2022   CREATININE 1.36 (H) 01/20/2022   CALCIUM 8.5 (L) 09/19/2022   CALCIUM 10.0 09/17/2022   CALCIUM 8.6 (L) 01/20/2022   LFT Recent Labs    09/17/22 1225 09/19/22 0432  PROT 7.8 6.2*  ALBUMIN 4.5 3.2*  AST 64* 26  ALT 66* 35   ALKPHOS 196* 125  BILITOT 1.7* 0.8   PT/INR Lab Results  Component Value Date   INR 1.0 09/17/2022   INR 1.3 (H) 12/18/2019   Hepatitis Panel No results for input(s): "HEPBSAG", "HCVAB", "HEPAIGM", "HEPBIGM" in the last 72 hours. C-Diff No components found for: "CDIFF" Lipase  No results found for: "LIPASE"  Drugs of Abuse     Component Value Date/Time   LABOPIA POSITIVE (A) 11/07/2019 0231   COCAINSCRNUR NONE DETECTED 11/07/2019 0231   LABBENZ NONE DETECTED 11/07/2019 0231   AMPHETMU NONE DETECTED 11/07/2019 0231   THCU POSITIVE (A) 11/07/2019 0231   LABBARB NONE DETECTED 11/07/2019 0231     RADIOLOGY STUDIES: CT ABDOMEN PELVIS W CONTRAST  Result Date: 09/17/2022 CLINICAL DATA:  Sepsis Abdominal pain, acute, nonlocalized EXAM: CT ABDOMEN AND PELVIS WITH CONTRAST TECHNIQUE: Multidetector CT imaging of the abdomen and pelvis was performed using the standard protocol following bolus administration of intravenous contrast. RADIATION DOSE REDUCTION: This exam was performed according to the departmental dose-optimization program which includes automated exposure control, adjustment of the mA and/or kV according to patient size and/or use of iterative reconstruction technique. CONTRAST:  156m OMNIPAQUE IOHEXOL 300 MG/ML  SOLN COMPARISON:  CT abdomen pelvis 02/13/2017, PET CT 02/11/2020 FINDINGS: Lower chest: No acute abnormality.  Coronary artery calcification. Hepatobiliary: No focal liver abnormality. No gallstones, gallbladder wall thickening, or pericholecystic fluid. The common bile duct is dilated measuring up to 1.7 cm. Debris is noted within the distal common bile duct (5:56). Pancreas: No focal lesion. Normal pancreatic contour. No surrounding inflammatory changes. No main pancreatic ductal dilatation. Spleen: Normal in size. Punctate calcifications. Subcentimeter hypodensity too small to characterize. There is an indeterminate 1.6 cm in hypodensity. Adrenals/Urinary Tract: No  adrenal nodule bilaterally. Bilateral kidneys enhance symmetrically. No hydronephrosis. Mild bilateral hydroureter with fullness of the left renal pelvis. No nephroureterolithiasis. The urinary bladder is unremarkable. Stomach/Bowel: Stomach is within normal limits. No evidence of bowel wall thickening or dilatation. Few scattered colonic diverticula. Appendix appears normal. Vascular/Lymphatic: No abdominal aorta or iliac aneurysm. Severe calcified and noncalcified atherosclerotic plaque of the aorta and its branches. There is a aortocaval 3.8 x 2.1 cm density at the level of the aortic bifurcation. No abdominal, pelvic, or inguinal lymphadenopathy. Reproductive: Prostate is unremarkable. Other: No intraperitoneal free fluid. No intraperitoneal free gas. No organized fluid collection. Musculoskeletal: No abdominal wall hernia or abnormality. No suspicious lytic or blastic osseous lesions. No acute displaced fracture. Multilevel degenerative changes of the spine. Old healed right femoral fracture with intramedullary nail fixation partially visualized. IMPRESSION: 1. Dilated common bile duct with likely choledocholithiasis given debris noted within the distal common bile duct. Differential diagnosis includes intraluminal mass. Recommend MRCP further evaluation. 2. Mild bilateral hydroureter with fullness of the left renal pelvis. This may reflect the residual of a recently passed calculus or reflect chronic changes of obstructive uropathy or reflux. 3.  Indeterminate aortocaval 3.8 x 2.1 cm density at the level of the aortic bifurcation. Recommend attention on follow-up in the setting of prior malignancy. 4. Colonic diverticulosis with no acute diverticulitis. 5.  Aortic Atherosclerosis (ICD10-I70.0). Electronically Signed   By: Iven Finn M.D.   On: 09/17/2022 16:35   DG Chest 2 View  Result Date: 09/17/2022 CLINICAL DATA:  Cough EXAM: CHEST - 2 VIEW COMPARISON:  None Available. FINDINGS: Normal cardiac  silhouette. Low lung volumes. Chronic bronchitic markings. Degenerative osteophytosis of the spine. IMPRESSION: 1. No significant change from prior. 2. Chronic bronchitic markings. No clear acute cardiopulmonary findings. Electronically Signed   By: Suzy Bouchard M.D.   On: 09/17/2022 16:11   CT HEAD WO CONTRAST  Result Date: 09/17/2022 CLINICAL DATA:  Confusion.  Right facial droop today. EXAM: CT HEAD WITHOUT CONTRAST TECHNIQUE: Contiguous axial images were obtained from the base of the skull through the vertex without intravenous contrast. RADIATION DOSE REDUCTION: This exam was performed according to the departmental dose-optimization program which includes automated exposure control, adjustment of the mA and/or kV according to patient size and/or use of iterative reconstruction technique. COMPARISON:  01/18/2022. FINDINGS: Brain: No evidence of acute infarction, hemorrhage, hydrocephalus, extra-axial collection or mass lesion/mass effect. Mild patchy periventricular white matter hypoattenuation consistent with chronic microvascular ischemic change. Stable. Vascular: No hyperdense vessel or unexpected calcification. Skull: Normal. Negative for fracture or focal lesion. Sinuses/Orbits: Globes and orbits are unremarkable. Mild scattered mucosal thickening, inferior frontal and bilateral ethmoid sinuses, minimally in the maxillary sinuses. Other: None. IMPRESSION: 1. No acute intracranial abnormalities. Electronically Signed   By: Lajean Manes M.D.   On: 09/17/2022 13:01      IMPRESSION:    Recurrent choledocholithiasis in pt w choledocholithiasis late 2021.  S/p ERCP/EUS/sphincterotomy/stone removal/biliary stent placement 03/2021.  Biliary stent removed 06/2021. Abx coverage w Rocephin, Metronidazole.  Note prior rash, low severity reaction to Ceclor in alergy list.     Pyuria, hematuria, scant bacteruria.  Urine clx pndg.        AMS,  baseline dementia.    Currently his dementia does not seem  severe.  Family reports progressive memory and functional issues over several months.    Opiate dependent chronic abd pain.      PLAN:       ERCP tomorrow afternoon.  Ordered regular diet for today.  Continue the Rocephin and Flagyl.  plans/procedure details/risks with his son Kent Flowers.  [phone].  And pts dtr in law.  They will be coming to the hospital and signed the consent when they get here.  Patient's son and daughter-in-law asked if the patient could get a neurology consult because his mental status has been in a more pronounced decline for several months.  Obtain general surgery consult.  Patient does not seem to be superhigh risk for laparoscopic cholecystectomy and this is his second bout with choledocholithiasis.   Azucena Freed  09/19/2022, 8:12 AM Phone [phone]

## 2022-09-19 NOTE — Progress Notes (Signed)
PROGRESS NOTE    Kent Flowers  OEV:[id number] DOB: February 11, 1946 DOA: 09/17/2022 PCP: Carylon Perches, NP   Brief Narrative:  HPI: Kent Flowers is a 76 y.o. male with medical history significant for Alzheimer's dementia, COPD, CKD stage IIIa, thoracic aortic aneurysm, BPH, chronic pain, history of choledocholithiasis S/p ERCP with sphincterotomy 04/14/2021 followed by stent removal 06/24/2021 who really presented to [facility] ED afternoon 09/17/2022 for evaluation of confusion and right facial droop.   Patient has been having issues with memory impairment and slow speech for at least 6 months now.  On 10/1 he was feeling confused and called his son for assistance.  His son noted that patient had a right sided facial droop when he saw him that was also visualized in triage per ED RN documentation.  Facial droop has since resolved.   Patient reports several days ago experiencing mid abdominal/epigastric pain which also resolved on its own after short period of time.  Currently he feels back to his usual baseline.  He denies any subjective fevers, chills, diaphoresis, nausea, vomiting, dysuria.   [facility] ED Course  Labs/Imaging on admission: I have personally reviewed following labs and imaging studies.   Initial vitals showed BP 136/84, pulse 83, RR 16, temp 98.0 F, SPO2 95% on room air.   Labs show WBC 16.7, hemoglobin 16.2, platelets 308,000, sodium 137, potassium 4.4, bicarb 27, BUN 27, creatinine 1.70 (baseline 1.3), serum glucose 116, AST 64, ALT 66, alk phos 196, total bilirubin 1.7, ammonia 34, lactic acid 0.7 > 0.6.   Blood culture collected 10/1 and second culture collected 10/2.  Urinalysis shows negative nitrates, large leukocytes, 21-50 RBC, >50 WBC, rare bacteria.  Urine culture in process.   CT head without contrast negative for acute intracranial abnormality.   2 view chest x-ray shows chronic bronchitic markings without focal consolidation, edema,  effusion.   CT abdomen/pelvis with contrast shows dilated CBD with likely choledocholithiasis given debris noted within the distal CBD.  Differential includes intraluminal mass.  Mild bilateral hydroureter with fullness of the left renal pelvis noted.  Indeterminate aortocaval 3.8 x 2.1 cm density at the level of the aortic bifurcation seen.   Patient was given 1 L LR, IV ciprofloxacin and Flagyl.  The hospitalist service was consulted to admit for further evaluation and management.  Patient arrived to [facility] inpatient unit evening of 10/2.  Assessment & Plan:   Principal Problem:   Choledocholithiasis Active Problems:   Acute renal failure superimposed on stage 3a chronic kidney disease (HCC)   Acute encephalopathy   COPD (chronic obstructive pulmonary disease) (HCC)   Chronic low back pain   Mild late onset Alzheimer's dementia without behavioral disturbance, psychotic disturbance, mood disturbance, or anxiety (HCC)  Choledocholithiasis/elevated LFTs:  History of prior recurrent choledocholithiasis with most recent ERCP with sphincterotomy April 2022 followed by stent removal July 2022 through [place] [facility].  Has not had gallbladder removed.  LFTs elevated. CT A/P shows dilated CBD with likely choledocholithiasis. Continue empiric IV Rocephin and Flagyl in the meantime.  MRCP completed and confirms CBD dilatation with choledocholithiasis.  GI on board.  Plan for ERCP tomorrow morning.   Acute renal failure superimposed on stage 3a chronic kidney disease (HCC) Creatinine 1.70 on admission compared to prior baseline around 1.3.  Urine culture is no growth. CT A/P shows mild bilateral hydroureter with fullness of the left renal pelvis.  This may reflect residual of a recently passed calculus or reflect chronic changes of  obstructive uropathy or reflux.  Creatinine essentially stable compared to yesterday, continue IV fluids.  Repeat labs in the morning.   Acute  encephalopathy: Presented to ED with some confusion on background of mild dementia/memory impairment.  Patient during my evaluation is fully alert and oriented.  MRI negative for stroke.   COPD (chronic obstructive pulmonary disease) (HCC) Stable, no wheezing on admission. -Continue Breztri and albuterol as needed   Mild late onset Alzheimer's dementia without behavioral disturbance, psychotic disturbance, mood disturbance, or anxiety (HCC) Memory issues and slow speech for at least 6 months.  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 -Delirium precautions -Continue Seroquel and Aricept   Chronic low back pain On chronic narcotics for many years. -Continue Oxy IR 15 mg q6h prn with hold parameters  DVT prophylaxis: heparin injection 5,000 Units Start: 09/18/22 2200   Code Status: Partial Code  Family Communication: \ None present at bedside.   Status is: Inpatient Remains inpatient appropriate because: Scheduled for ERCP tomorrow morning.   Estimated body mass index is 25.14 kg/m as calculated from the following:   Height as of this encounter: _0  (1.702 m).   Weight as of this encounter: 72.8 kg.    Nutritional Assessment: Body mass index is 25.14 kg/m.Marland Kitchen Seen by dietician.  I agree with the assessment and plan as outlined below: Nutrition Status:        . Skin Assessment: I have examined the patient's skin and I agree with the wound assessment as performed by the wound care RN as outlined below:    Consultants:  GI  Procedures:  None  Antimicrobials:  Anti-infectives (From admission, onward)    Start     Dose/Rate Route Frequency Ordered Stop   09/18/22 1930  cefTRIAXone (ROCEPHIN) 2 g in sodium chloride 0.9 % 100 mL IVPB        2 g 200 mL/hr over 30 Minutes Intravenous Every 24 hours 09/18/22 1831     09/17/22 1715  metroNIDAZOLE (FLAGYL) IVPB 500 mg        500 mg 100 mL/hr over 60 Minutes Intravenous Every 12 hours 09/17/22 1701      09/17/22 1715  ciprofloxacin (CIPRO) IVPB 400 mg  Status:  Discontinued        400 mg 200 mL/hr over 60 Minutes Intravenous Every 12 hours 09/17/22 1701 09/18/22 1831         Subjective: Patient seen and examined.  He is fully alert and oriented.  He says that he is hungry and wants to eat.  He says he has not eaten in 3 days.  No other complaint.  Objective: Vitals:   09/18/22 1637 09/18/22 2025 09/18/22 2324 09/19/22 0433  BP: (!) 140/74 125/82 119/74 (!) 140/77  Pulse: 75 81 87 70  Resp:  _1 Temp: 98.2 F (36.8 C) 98.6 F (37 C) 98.9 F (37.2 C) 98.7 F (37.1 C)  TempSrc: Oral Oral Oral Oral  SpO2: 97% 97% 97% 99%  Weight:      Height: _2  (1.702 m)       Intake/Output Summary (Last 24 hours) at 09/19/2022 0751 Last data filed at 09/18/2022 1500 Gross per 24 hour  Intake 555.88 ml  Output 550 ml  Net 5.88 ml   Filed Weights   09/17/22 1222  Weight: 72.8 kg    Examination:  General exam: Appears calm and comfortable  Respiratory system: Clear to auscultation. Respiratory effort normal. Cardiovascular system: S1 & S2  heard, RRR. No JVD, murmurs, rubs, gallops or clicks. No pedal edema. Gastrointestinal system: Abdomen is nondistended, soft and nontender. No organomegaly or masses felt. Normal bowel sounds heard. Central nervous system: Alert and oriented. No focal neurological deficits. Extremities: Symmetric 5 x 5 power. Skin: No rashes, lesions or ulcers  Data Reviewed: I have personally reviewed following labs and imaging studies  CBC: Recent Labs  Lab 09/17/22 1225 09/17/22 1424 09/19/22 0432  WBC 16.7*  --  10.4  NEUTROABS 13.6*  --   --   HGB 16.2 16.0 15.6  HCT 49.5 47.0 46.6  MCV 92.2  --  92.3  PLT 308  --  366   Basic Metabolic Panel: Recent Labs  Lab 09/17/22 1225 09/17/22 1424 09/19/22 0432  NA 137 137 139  K 4.4 4.1 4.1  CL 101  --  104  CO2 27  --  24  GLUCOSE 116*  --  96  BUN 27*  --  23  CREATININE 1.70*  --   1.66*  CALCIUM 10.0  --  8.5*   GFR: Estimated Creatinine Clearance: 36.5 mL/min (A) (by C-G formula based on SCr of 1.66 mg/dL (H)). Liver Function Tests: Recent Labs  Lab 09/17/22 1225 09/19/22 0432  AST 64* 26  ALT 66* 35  ALKPHOS 196* 125  BILITOT 1.7* 0.8  PROT 7.8 6.2*  ALBUMIN 4.5 3.2*   No results for input(s): "LIPASE", "AMYLASE" in the last 168 hours. Recent Labs  Lab 09/17/22 1418  AMMONIA 34   Coagulation Profile: Recent Labs  Lab 09/17/22 1225  INR 1.0   Cardiac Enzymes: No results for input(s): "CKTOTAL", "CKMB", "CKMBINDEX", "TROPONINI" in the last 168 hours. BNP (last 3 results) No results for input(s): "PROBNP" in the last 8760 hours. HbA1C: No results for input(s): "HGBA1C" in the last 72 hours. CBG: Recent Labs  Lab 09/17/22 1219 09/18/22 1445  GLUCAP 117* 103*   Lipid Profile: No results for input(s): "CHOL", "HDL", "LDLCALC", "TRIG", "CHOLHDL", "LDLDIRECT" in the last 72 hours. Thyroid Function Tests: No results for input(s): "TSH", "T4TOTAL", "FREET4", "T3FREE", "THYROIDAB" in the last 72 hours. Anemia Panel: No results for input(s): "VITAMINB12", "FOLATE", "FERRITIN", "TIBC", "IRON", "RETICCTPCT" in the last 72 hours. Sepsis Labs: Recent Labs  Lab 09/17/22 1738 09/17/22 1956  LATICACIDVEN 0.7 0.6    Recent Results (from the past 240 hour(s))  Urine Culture     Status: None   Collection Time: 09/17/22  4:07 PM   Specimen: Urine, Clean Catch  Result Value Ref Range Status   Specimen Description   Final    URINE, CLEAN CATCH Performed at [facility], [address] [place], [place] [address]    Special Requests   Final    NONE Performed at [facility], [address] [place], [place] [address]    Culture   Final    NO GROWTH Performed at [facility], [place] [address], [place], [place] [address]    Report Status 09/18/2022 FINAL  Final  Culture, blood (routine x 2)     Status:  None (Preliminary result)   Collection Time: 09/17/22  5:38 PM   Specimen: BLOOD RIGHT ARM  Result Value Ref Range Status   Specimen Description BLOOD RIGHT ARM  Final   Special Requests   Final    BOTTLES DRAWN AEROBIC AND ANAEROBIC Blood Culture adequate volume   Culture   Final    NO GROWTH 2 DAYS Performed at [facility], [place]  [address], [place], [place] [address]    Report Status PENDING  Incomplete     Radiology Studies: CT ABDOMEN PELVIS W CONTRAST  Result Date: 09/17/2022 CLINICAL DATA:  Sepsis Abdominal pain, acute, nonlocalized EXAM: CT ABDOMEN AND PELVIS WITH CONTRAST TECHNIQUE: Multidetector CT imaging of the abdomen and pelvis was performed using the standard protocol following bolus administration of intravenous contrast. RADIATION DOSE REDUCTION: This exam was performed according to the departmental dose-optimization program which includes automated exposure control, adjustment of the mA and/or kV according to patient size and/or use of iterative reconstruction technique. CONTRAST:  172m OMNIPAQUE IOHEXOL 300 MG/ML  SOLN COMPARISON:  CT abdomen pelvis 02/13/2017, PET CT 02/11/2020 FINDINGS: Lower chest: No acute abnormality.  Coronary artery calcification. Hepatobiliary: No focal liver abnormality. No gallstones, gallbladder wall thickening, or pericholecystic fluid. The common bile duct is dilated measuring up to 1.7 cm. Debris is noted within the distal common bile duct (5:56). Pancreas: No focal lesion. Normal pancreatic contour. No surrounding inflammatory changes. No main pancreatic ductal dilatation. Spleen: Normal in size. Punctate calcifications. Subcentimeter hypodensity too small to characterize. There is an indeterminate 1.6 cm in hypodensity. Adrenals/Urinary Tract: No adrenal nodule bilaterally. Bilateral kidneys enhance symmetrically. No hydronephrosis. Mild bilateral hydroureter with fullness of the left renal pelvis. No nephroureterolithiasis. The urinary  bladder is unremarkable. Stomach/Bowel: Stomach is within normal limits. No evidence of bowel wall thickening or dilatation. Few scattered colonic diverticula. Appendix appears normal. Vascular/Lymphatic: No abdominal aorta or iliac aneurysm. Severe calcified and noncalcified atherosclerotic plaque of the aorta and its branches. There is a aortocaval 3.8 x 2.1 cm density at the level of the aortic bifurcation. No abdominal, pelvic, or inguinal lymphadenopathy. Reproductive: Prostate is unremarkable. Other: No intraperitoneal free fluid. No intraperitoneal free gas. No organized fluid collection. Musculoskeletal: No abdominal wall hernia or abnormality. No suspicious lytic or blastic osseous lesions. No acute displaced fracture. Multilevel degenerative changes of the spine. Old healed right femoral fracture with intramedullary nail fixation partially visualized. IMPRESSION: 1. Dilated common bile duct with likely choledocholithiasis given debris noted within the distal common bile duct. Differential diagnosis includes intraluminal mass. Recommend MRCP further evaluation. 2. Mild bilateral hydroureter with fullness of the left renal pelvis. This may reflect the residual of a recently passed calculus or reflect chronic changes of obstructive uropathy or reflux. 3. Indeterminate aortocaval 3.8 x 2.1 cm density at the level of the aortic bifurcation. Recommend attention on follow-up in the setting of prior malignancy. 4. Colonic diverticulosis with no acute diverticulitis. 5.  Aortic Atherosclerosis (ICD10-I70.0). Electronically Signed   By: MIven FinnM.D.   On: 09/17/2022 16:35   DG Chest 2 View  Result Date: 09/17/2022 CLINICAL DATA:  Cough EXAM: CHEST - 2 VIEW COMPARISON:  None Available. FINDINGS: Normal cardiac silhouette. Low lung volumes. Chronic bronchitic markings. Degenerative osteophytosis of the spine. IMPRESSION: 1. No significant change from prior. 2. Chronic bronchitic markings. No clear acute  cardiopulmonary findings. Electronically Signed   By: SSuzy BouchardM.D.   On: 09/17/2022 16:11   CT HEAD WO CONTRAST  Result Date: 09/17/2022 CLINICAL DATA:  Confusion.  Right facial droop today. EXAM: CT HEAD WITHOUT CONTRAST TECHNIQUE: Contiguous axial images were obtained from the base of the skull through the vertex without intravenous contrast. RADIATION DOSE REDUCTION: This exam was performed according to the departmental dose-optimization program which includes automated exposure control, adjustment of the mA and/or kV according to patient size and/or use of iterative reconstruction technique. COMPARISON:  01/18/2022. FINDINGS: Brain: No evidence  of acute infarction, hemorrhage, hydrocephalus, extra-axial collection or mass lesion/mass effect. Mild patchy periventricular white matter hypoattenuation consistent with chronic microvascular ischemic change. Stable. Vascular: No hyperdense vessel or unexpected calcification. Skull: Normal. Negative for fracture or focal lesion. Sinuses/Orbits: Globes and orbits are unremarkable. Mild scattered mucosal thickening, inferior frontal and bilateral ethmoid sinuses, minimally in the maxillary sinuses. Other: None. IMPRESSION: 1. No acute intracranial abnormalities. Electronically Signed   By: Lajean Manes M.D.   On: 09/17/2022 13:01    Scheduled Meds:  donepezil  10 mg Oral QHS   fluticasone furoate-vilanterol  1 puff Inhalation Daily   heparin  5,000 Units Subcutaneous Q8H   QUEtiapine  25 mg Oral QHS   sodium chloride flush  3 mL Intravenous Q12H   tamsulosin  0.4 mg Oral QHS   umeclidinium bromide  1 puff Inhalation Daily   Continuous Infusions:  cefTRIAXone (ROCEPHIN)  IV Stopped (09/18/22 2319)   lactated ringers 125 mL/hr at 09/18/22 2318   metronidazole 500 mg (09/19/22 0546)     LOS: 1 day   Darliss Cheney, MD [facility]  09/19/2022, 7:51 AM   *Please note that this is a verbal dictation therefore any spelling or grammatical  errors are due to the "[place] One" system interpretation.  Please page via [place] and do not message via secure chat for urgent patient care matters. Secure chat can be used for non urgent patient care matters.  How to contact the [facility] Attending or Consulting provider [place] or covering provider during after hours [place], for this patient?  Check the care team in [facility] and look for a) attending/consulting [facility] provider listed and b) the [facility] team listed. Page or secure chat [phone]. Log into [email or web address] and use [facility] universal password to access. If you do not have the password, please contact the hospital operator. Locate the [facility] provider you are looking for under [facility] and page to a number that you can be directly reached. If you still have difficulty reaching the provider, please page the [facility] (Director on Call) for the Hospitalists listed on amion for assistance.

## 2022-09-19 NOTE — Consult Note (Signed)
[name] [facility] [place] [birth year]  [id number].    Requesting MD: Don Broach Chief Complaint/Reason for Consult: recurrent choledocholithiasis   HPI:  Kent Flowers is a 76 year old male with a past medical history of COPD, CKD, thoracic aortic aneurysm, BPH, chronic pain after spinal surgery with complication, and relatively recent diagnosis of Alzheimer's dementia who presents from [place] due to recurrent choledocholithiasis.  Today during my exam the patient is oriented to person, [facility], and year of 2023.  He tells me he is not exactly sure why he is here but he thinks it had to do with left-sided abdominal pain.  He denies abdominal pain currently.  He denies nausea or vomiting.  He reports constipation, last BM more than 2 days ago.  When I ask him about his history of gallstones he tells me that he had a gallstone in his bile duct last year and it was treated without surgery.  He denies ever being referred to a surgeon to discuss cholecystectomy.   He denies a history of previous abdominal surgery.  He denies the use of blood thinning medications.  He confirms that he is a former smoker.  At baseline he reports that he lives independently and has help from a home aide from 1 to 5 PM.  Reports he is independent of his activities of daily living and bathes independently.  States he quit driving about a year ago.  He likes to involve his son and his medical decision making.  Per chart review his father died of cholangiocarcinoma.  He denies chest pain, dyspnea on exertion, or use of home oxygen.  He states he has not been very active over the last year but he is able to walk to and from his mailbox without shortness of breath or chest pain.   ROS: Review of Systems  Constitutional: Negative.   HENT: Negative.    Eyes: Negative.   Respiratory: Negative.    Cardiovascular: Negative.   Gastrointestinal:  Positive for abdominal pain.  Skin: Negative.    Neurological: Negative.   Endo/Heme/Allergies: Negative.   Psychiatric/Behavioral:  Positive for memory loss.     Family History  Problem Relation Age of Onset   Emphysema Mother    Cancer Father        bile duct    Past Medical History:  Diagnosis Date   Benign localized prostatic hyperplasia with lower urinary tract symptoms (LUTS)    Chronic low back pain    Common bile duct stone 11/03/2020   COPD (chronic obstructive pulmonary disease) with emphysema ([place])    PULMOLOGIST-- DR Tarri Fuller YOUNG   Diskitis 05/10/2020   Dizziness 05/10/2020   Ectatic thoracic aorta (HCC)    ED (erectile dysfunction)    Fecal incontinence 03/24/2020   Full dentures    Gross hematuria    History of squamous cell carcinoma in situ    03-14-2013---  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   Wears glasses     Past Surgical History:  Procedure Laterality Date   CYSTOSCOPY WITH BIOPSY N/A 04/10/2017   Procedure: POSSIBLE BLADDER  BIOPSY;  Surgeon: Festus Aloe, MD;  Location: [facility];  Service: Urology;  Laterality: N/A;   CYSTOSCOPY WITH URETHRAL DILATATION N/A 04/10/2017   Procedure: CYSTOSCOPY WITH BALLOON URETHRALSTRICTURE DILATATION;  Surgeon: Festus Aloe, MD;  [facility];  Service: Urology;  Laterality: N/A;   PENILE BIOPSY N/A 03/14/2013   Procedure: PENILE BIOPSY;  Surgeon: Fredricka Bonine, MD;  Location: [facility];  Service: Urology;  Laterality: N/A;   REATTACHMENT LEFT INDEX FINGER  1988   SHOULDER ARTHROSCOPY WITH ROTATOR CUFF REPAIR AND SUBACROMIAL DECOMPRESSION Left 2004   THORACIC LAMINECTOMY FOR EPIDURAL ABSCESS N/A 12/18/2019   Procedure: THORACIC LAMINECTOMY FOR EPIDURAL ABSCESS Thoracic Two, Thoracic Three;  Surgeon: [name], MD;  Location: [place];  Service: Neurosurgery;  Laterality: N/A;  THORACIC LAMINECTOMY FOR EPIDURAL ABSCESS Thoracic Two, Thoracic Three   TONSILLECTOMY  AS CHILD    Social History:  reports that he quit smoking about 12 years ago. His smoking use included cigarettes. He has a 12.00 pack-year smoking history. He has never used smokeless tobacco. He reports current alcohol use of about 3.0 standard drinks of alcohol per week. He reports that he does not use drugs.  Allergies:  Allergies  Allergen Reactions   Ceclor [Cefaclor] Rash   Sulfa Antibiotics Rash and Other (See Comments)    SEVERE RASH- childhood allergy    Medications Prior to Admission  Medication Sig Dispense Refill   albuterol (PROAIR HFA) 108 (90 Base) MCG/ACT inhaler Inhale 2 puffs into the lungs every 6 (six) hours as needed for shortness of breath or wheezing. 8 g 3   Budeson-Glycopyrrol-Formoterol (BREZTRI AEROSPHERE) 160-9-4.8 MCG/ACT AERO Inhale 2 puffs into the lungs in the morning and at bedtime. 10.7 g 6   donepezil (ARICEPT) 10 MG tablet Take 1 tablet (10 mg total) by mouth at bedtime. 30 tablet 11   oxyCODONE (ROXICODONE) 15 MG immediate release tablet Take 1 tablet (15 mg total) by mouth 5 (five) times daily as needed for pain. 15 tablet 0   QUEtiapine (SEROQUEL) 25 MG tablet TAKE 1 TABLET BY MOUTH EVERYDAY AT BEDTIME (Patient taking differently: Take 25 mg by mouth at bedtime.) 30 tablet 1   tamsulosin (FLOMAX) 0.4 MG CAPS capsule Take 0.4 mg by mouth at bedtime.     testosterone cypionate (DEPOTESTOSTERONE CYPIONATE) 200 MG/ML  injection Inject 180 mg into the muscle every 14 (fourteen) days.        Physical Exam: Blood pressure (!) 114/58, pulse 76, temperature 98.2 F (36.8 C), temperature source Oral, resp. rate 17, height '5\' 7"'$  (1.702 m), weight 72.8 kg, SpO2 95 %. General: Pleasant white elderly male  sitting up in hospital bed, appears stated age, NAD. HEENT: head -normocephalic, atraumatic; Eyes: PERRLA, anicteric sclerae Neck- Trachea is midline, no thyromegaly or JVD appreciated.  CV- RRR, normal S1/S2, no M/R/G, no lower extremity edema, pedal pulses palpable bilaterally Pulm- breathing is non-labored on 0.5 L of oxygen via nasal cannula. CTABL, no wheezes, rhales, rhonchi. Abd- soft, NT/ND, no masses, hernias, or organomegaly. GU- deferred  MSK- UE/LE symmetrical, no cyanosis, clubbing, or edema. Neuro- CN II-XII grossly in tact, no paresthesias. Psych- Alert and Oriented x3 with appropriate affect Skin: warm and dry,  no rashes or lesions   Results for orders placed or performed during the hospital encounter of 09/17/22 (from the past 48 hour(s))  Ammonia     Status: None   Collection Time: 09/17/22  2:18 PM  Result Value Ref Range   Ammonia 34 9 - 35 umol/L    Comment: Performed at [facility], [address], [place], [place] [address]  I-Stat venous blood gas, ED     Status: Abnormal   Collection Time: 09/17/22  2:24 PM  Result Value Ref Range   pH, Ven 7.410 7.25 - 7.43   pCO2, Ven 47.5 44 - 60 mmHg   pO2, Ven 43 32 - 45 mmHg   Bicarbonate 30.2 (H) 20.0 - 28.0 mmol/L   TCO2 32 22 - 32 mmol/L   O2 Saturation 79 %   Acid-Base Excess 4.0 (H) 0.0 - 2.0 mmol/L   Sodium 137 135 - 145 mmol/L   Potassium 4.1 3.5 - 5.1 mmol/L   Calcium, Ion 1.22 1.15 - 1.40 mmol/L   HCT 47.0 39.0 - 52.0 %   Hemoglobin 16.0 13.0 - 17.0 g/dL   Patient temperature 98.0 F    Collection site IV start    Drawn by RT    Sample type VENOUS   Urinalysis, Routine w reflex microscopic Urine,  Clean Catch     Status: Abnormal   Collection Time: 09/17/22  3:20 PM  Result Value Ref Range   Color, Urine YELLOW YELLOW   APPearance HAZY (A) CLEAR   Specific Gravity, Urine 1.014 1.005 - 1.030   pH 6.5 5.0 - 8.0   Glucose, UA NEGATIVE NEGATIVE mg/dL   Hgb urine dipstick MODERATE (A) NEGATIVE   Bilirubin Urine NEGATIVE NEGATIVE   Ketones, ur NEGATIVE NEGATIVE mg/dL   Protein, ur 30 (A) NEGATIVE mg/dL   Nitrite NEGATIVE NEGATIVE   Leukocytes,Ua LARGE (A) NEGATIVE   RBC / HPF 21-50 0 - 5 RBC/hpf   WBC, UA >50 (H) 0 - 5 WBC/hpf   Bacteria, UA RARE (A) NONE SEEN   Mucus PRESENT    Hyaline Casts, UA PRESENT     Comment: Performed at [facility], [address], [place], [place] [address]  Urine Culture     Status: None   Collection Time: 09/17/22  4:07 PM   Specimen: Urine, Clean Catch  Result Value Ref Range   Specimen Description      URINE, CLEAN CATCH Performed at [facility], [address] [place], [place] [address]    Special Requests      NONE Performed at [facility], [address], [place], [place] [address]    Culture      NO GROWTH Performed at [facility], [address] [place], [place] [address]    Report Status 09/18/2022 FINAL   Culture, blood (routine x 2)     Status: None (Preliminary result)   Collection Time: 09/17/22  5:38 PM   Specimen: BLOOD RIGHT ARM  Result Value Ref Range   Specimen Description BLOOD RIGHT ARM    Special Requests      BOTTLES DRAWN AEROBIC AND ANAEROBIC Blood Culture adequate volume   Culture      NO GROWTH 2 DAYS Performed at [facility], [place] [address] [place],  [address]    Report Status PENDING   Lactic acid, plasma     Status: None   Collection Time: 09/17/22  5:38 PM  Result Value Ref Range  Lactic Acid, Venous 0.7 0.5 - 1.9 mmol/L    Comment: Performed at [facility], [address] [place], [place]  [address]  Lactic acid, plasma     Status: None   Collection Time: 09/17/22  7:56 PM  Result Value Ref Range   Lactic Acid, Venous 0.6 0.5 - 1.9 mmol/L    Comment: Performed at [facility], [address], [place], [place] [address]  CBG monitoring, ED     Status: Abnormal   Collection Time: 09/18/22  2:45 PM  Result Value Ref Range   Glucose-Capillary 103 (H) 70 - 99 mg/dL    Comment: Glucose reference range applies only to samples taken after fasting for at least 8 hours.  Comprehensive metabolic panel     Status: Abnormal   Collection Time: 09/19/22  4:32 AM  Result Value Ref Range   Sodium 139 135 - 145 mmol/L   Potassium 4.1 3.5 - 5.1 mmol/L   Chloride 104 98 - 111 mmol/L   CO2 24 22 - 32 mmol/L   Glucose, Bld 96 70 - 99 mg/dL    Comment: Glucose reference range applies only to samples taken after fasting for at least 8 hours.   BUN 23 8 - 23 mg/dL   Creatinine, Ser 1.66 (H) 0.61 - 1.24 mg/dL   Calcium 8.5 (L) 8.9 - 10.3 mg/dL   Total Protein 6.2 (L) 6.5 - 8.1 g/dL   Albumin 3.2 (L) 3.5 - 5.0 g/dL   AST 26 15 - 41 U/L   ALT 35 0 - 44 U/L   Alkaline Phosphatase 125 38 - 126 U/L   Total Bilirubin 0.8 0.3 - 1.2 mg/dL   GFR, Estimated 43 (L) >60 mL/min    Comment: (NOTE) Calculated using the CKD-EPI Creatinine Equation (2021)    Anion gap 11 5 - 15    Comment: Performed at [facility], [place] [address], [place] [address]  CBC     Status: None   Collection Time: 09/19/22  4:32 AM  Result Value Ref Range   WBC 10.4 4.0 - 10.5 K/uL   RBC 5.05 4.22 - 5.81 MIL/uL   Hemoglobin 15.6 13.0 - 17.0 g/dL   HCT 46.6 39.0 - 52.0 %   MCV 92.3 80.0 - 100.0 fL   MCH 30.9 26.0 - 34.0 pg   MCHC 33.5 30.0 - 36.0 g/dL   RDW 13.9 11.5 - 15.5 %   Platelets 218 150 - 400 K/uL   nRBC 0.0 0.0 - 0.2 %    Comment: Performed at [facility], [place] [address], [place], [place] [address]   MR ABDOMEN MRCP WO CONTRAST  Result Date: 09/19/2022 CLINICAL DATA:   Cholelithiasis EXAM: MRI ABDOMEN WITHOUT CONTRAST  (INCLUDING MRCP) TECHNIQUE: Multiplanar multisequence MR imaging of the abdomen was performed. Heavily T2-weighted images of the biliary and pancreatic ducts were obtained, and three-dimensional MRCP images were rendered by post processing. COMPARISON:  CT abdomen pelvis dated September 17, 2022 FINDINGS: Lower chest: Right greater than left bibasilar atelectasis. Hepatobiliary: No suspicious focal liver lesions. Moderately dilated intrahepatic bile ducts and common bile duct, common bile duct measures up to 15 mm. Blunted affect of the distal common bile duct measuring 1.9 x 0.8 cm on series 5, image 17. Pancreas: No mass, inflammatory changes, or other parenchymal abnormality identified. Spleen: Scattered T2 hyperintense splenic lesions which are likely simple cysts. Normal size of the spleen. Adrenals/Urinary Tract: Bilateral adrenal glands are unremarkable. Moderate left  hydronephrosis and hydroureter, slightly increased hydronephrosis when compared with prior CT. No obstructing stone visualized. Stomach/Bowel: Visualized portions within the abdomen are unremarkable. Vascular/Lymphatic: No pathologically enlarged lymph nodes identified. Atherosclerotic disease. No abdominal aortic aneurysm demonstrated. Other: T2 hyperintense lesion located adjacent to the inferior vena cava measuring 3.0 x 2.0 cm on series 4, image 5, unchanged when compared with prior. Musculoskeletal: No suspicious bone lesions identified. IMPRESSION: 1. Moderate biliary ductal dilation with filling defect of the distal common bile duct measuring 1.9 x 0.8 cm, compatible with choledocholithiasis. 2. Moderate left hydronephrosis and hydroureter, slightly increased hydronephrosis when compared with prior CT. No obstructing stone visualized. Electronically Signed   By: Yetta Glassman M.D.   On: 09/19/2022 09:58   MR 3D Recon At Scanner  Result Date: 09/19/2022 CLINICAL DATA:  Cholelithiasis  EXAM: MRI ABDOMEN WITHOUT CONTRAST  (INCLUDING MRCP) TECHNIQUE: Multiplanar multisequence MR imaging of the abdomen was performed. Heavily T2-weighted images of the biliary and pancreatic ducts were obtained, and three-dimensional MRCP images were rendered by post processing. COMPARISON:  CT abdomen pelvis dated September 17, 2022 FINDINGS: Lower chest: Right greater than left bibasilar atelectasis. Hepatobiliary: No suspicious focal liver lesions. Moderately dilated intrahepatic bile ducts and common bile duct, common bile duct measures up to 15 mm. Blunted affect of the distal common bile duct measuring 1.9 x 0.8 cm on series 5, image 17. Pancreas: No mass, inflammatory changes, or other parenchymal abnormality identified. Spleen: Scattered T2 hyperintense splenic lesions which are likely simple cysts. Normal size of the spleen. Adrenals/Urinary Tract: Bilateral adrenal glands are unremarkable. Moderate left hydronephrosis and hydroureter, slightly increased hydronephrosis when compared with prior CT. No obstructing stone visualized. Stomach/Bowel: Visualized portions within the abdomen are unremarkable. Vascular/Lymphatic: No pathologically enlarged lymph nodes identified. Atherosclerotic disease. No abdominal aortic aneurysm demonstrated. Other: T2 hyperintense lesion located adjacent to the inferior vena cava measuring 3.0 x 2.0 cm on series 4, image 5, unchanged when compared with prior. Musculoskeletal: No suspicious bone lesions identified. IMPRESSION: 1. Moderate biliary ductal dilation with filling defect of the distal common bile duct measuring 1.9 x 0.8 cm, compatible with choledocholithiasis. 2. Moderate left hydronephrosis and hydroureter, slightly increased hydronephrosis when compared with prior CT. No obstructing stone visualized. Electronically Signed   By: Yetta Glassman M.D.   On: 09/19/2022 09:58   MR BRAIN WO CONTRAST  Result Date: 09/19/2022 CLINICAL DATA:  Neuro deficit, acute, stroke  suspected. EXAM: MRI HEAD WITHOUT CONTRAST TECHNIQUE: Multiplanar, multiecho pulse sequences of the brain and surrounding structures were obtained without intravenous contrast. COMPARISON:  Head CT September 17, 2022 FINDINGS: Brain: No acute infarction, hemorrhage, hydrocephalus, extra-axial collection or mass lesion. Scattered and confluent foci of T2 hyperintensity are seen within the white matter of the cerebral hemispheres, nonspecific, most likely related to chronic small vessel ischemia. Moderate parenchymal volume loss. Focus of susceptibility artifact in the right frontal lobe suggesting small hemosiderin deposit. Vascular: Normal flow voids. Skull and upper cervical spine: Type 2 odontoid fracture with mild posterior displacement of the superior fragment, similar to CT performed in February 2023. Sinuses/Orbits: Negative. Other: None. IMPRESSION: 1. No acute intracranial abnormality. 2. Moderate chronic microvascular ischemic changes of the white matter. 3. Moderate parenchymal volume loss. 4. Type 2 odontoid fracture with mild posterior displacement of the superior fragment, similar to CT performed in February 2023. Electronically Signed   By: Pedro Earls M.D.   On: 09/19/2022 09:14   CT ABDOMEN PELVIS W CONTRAST  Result Date: 09/17/2022 CLINICAL DATA:  Sepsis Abdominal pain, acute, nonlocalized EXAM: CT ABDOMEN AND PELVIS WITH CONTRAST TECHNIQUE: Multidetector CT imaging of the abdomen and pelvis was performed using the standard protocol following bolus administration of intravenous contrast. RADIATION DOSE REDUCTION: This exam was performed according to the departmental dose-optimization program which includes automated exposure control, adjustment of the mA and/or kV according to patient size and/or use of iterative reconstruction technique. CONTRAST:  129m OMNIPAQUE IOHEXOL 300 MG/ML  SOLN COMPARISON:  CT abdomen pelvis 02/13/2017, PET CT 02/11/2020 FINDINGS: Lower chest: No acute  abnormality.  Coronary artery calcification. Hepatobiliary: No focal liver abnormality. No gallstones, gallbladder wall thickening, or pericholecystic fluid. The common bile duct is dilated measuring up to 1.7 cm. Debris is noted within the distal common bile duct (5:56). Pancreas: No focal lesion. Normal pancreatic contour. No surrounding inflammatory changes. No main pancreatic ductal dilatation. Spleen: Normal in size. Punctate calcifications. Subcentimeter hypodensity too small to characterize. There is an indeterminate 1.6 cm in hypodensity. Adrenals/Urinary Tract: No adrenal nodule bilaterally. Bilateral kidneys enhance symmetrically. No hydronephrosis. Mild bilateral hydroureter with fullness of the left renal pelvis. No nephroureterolithiasis. The urinary bladder is unremarkable. Stomach/Bowel: Stomach is within normal limits. No evidence of bowel wall thickening or dilatation. Few scattered colonic diverticula. Appendix appears normal. Vascular/Lymphatic: No abdominal aorta or iliac aneurysm. Severe calcified and noncalcified atherosclerotic plaque of the aorta and its branches. There is a aortocaval 3.8 x 2.1 cm density at the level of the aortic bifurcation. No abdominal, pelvic, or inguinal lymphadenopathy. Reproductive: Prostate is unremarkable. Other: No intraperitoneal free fluid. No intraperitoneal free gas. No organized fluid collection. Musculoskeletal: No abdominal wall hernia or abnormality. No suspicious lytic or blastic osseous lesions. No acute displaced fracture. Multilevel degenerative changes of the spine. Old healed right femoral fracture with intramedullary nail fixation partially visualized. IMPRESSION: 1. Dilated common bile duct with likely choledocholithiasis given debris noted within the distal common bile duct. Differential diagnosis includes intraluminal mass. Recommend MRCP further evaluation. 2. Mild bilateral hydroureter with fullness of the left renal pelvis. This may reflect  the residual of a recently passed calculus or reflect chronic changes of obstructive uropathy or reflux. 3. Indeterminate aortocaval 3.8 x 2.1 cm density at the level of the aortic bifurcation. Recommend attention on follow-up in the setting of prior malignancy. 4. Colonic diverticulosis with no acute diverticulitis. 5.  Aortic Atherosclerosis (ICD10-I70.0). Electronically Signed   By: MIven FinnM.D.   On: 09/17/2022 16:35   DG Chest 2 View  Result Date: 09/17/2022 CLINICAL DATA:  Cough EXAM: CHEST - 2 VIEW COMPARISON:  None Available. FINDINGS: Normal cardiac silhouette. Low lung volumes. Chronic bronchitic markings. Degenerative osteophytosis of the spine. IMPRESSION: 1. No significant change from prior. 2. Chronic bronchitic markings. No clear acute cardiopulmonary findings. Electronically Signed   By: SSuzy BouchardM.D.   On: 09/17/2022 16:11      Assessment/Plan Pleasant 76year old male who presents with recurrent choledocholithiasis.  Per chart review [facility] and [facility], stent subsequently removed July 2022.  Per chart review it looks like he was referred to general surgery to discuss cholecystectomy but, for unclear reasons in the chart, cholecystectomy was canceled.  He now has recurrent choledocholithiasis.  MRCP demonstrates distal common bile duct stone.  Leukocytosis and LFTs have normalized on IV antibiotics.  GI tentatively planning ERCP tomorrow.  Given recurrent nature of this process I do think this patient would benefit from  laparoscopic cholecystectomy this admission.  Will discuss with the patient and his family again tomorrow pending results of ERCP.  I reviewed nursing notes, Consultant GI notes, hospitalist notes, last 24 h vitals and pain scores, last 48 h intake and output, last 24 h labs and trends, and last 24 h imaging results.  Jill Alexanders,  PA-C [facility] 09/19/2022, 1:30 PM Please see Amion for pager number during day hours 7:00am-4:30pm or 7:00am -11:30am on weekends

## 2022-09-19 NOTE — TOC Initial Note (Signed)
Transition of Care [facility]) - Initial/Assessment Note    Patient Details  Name: Kent Flowers MRN: [id number] Date of Birth: August 16, 1946  Transition [facility]) CM/SW Contact:    Pollie Friar, RN Phone Number: 09/19/2022, 3:45 PM  Clinical Narrative:                 Pt is from home alone. He has a caregiver 5 days a week for 4 hours a day. The days the caregiver isn't available pts daughter provides needed assistance.  Daughter/ caregiver do the transportation. Pt states he manages his own medications with his daughter over seeing them.  Pt denies current falls.  Will inquire with MD about PT eval.  ToC following.  Expected Discharge Plan: Home/Self Care Barriers to Discharge: Continued Medical Work up   Patient Goals and CMS Choice        Expected Discharge Plan and Services Expected Discharge Plan: Home/Self Care       Living arrangements for the past 2 months: Single Family Home                                      Prior Living Arrangements/Services Living arrangements for the past 2 months: Single Family Home Lives with:: Self Patient language and need for interpreter reviewed:: Yes Do you feel safe going back to the place where you live?: Yes          Current home services: DME (walker/ shower seat/ bedside commode) Criminal Activity/Legal Involvement Pertinent to Current Situation/Hospitalization: No - Comment as needed  Activities of Daily Living      Permission Sought/Granted                  Emotional Assessment Appearance:: Appears stated age Attitude/Demeanor/Rapport: Engaged Affect (typically observed): Accepting Orientation: : Oriented to Self, Oriented to Place, Oriented to Situation   Psych Involvement: No (comment)  Admission diagnosis:  Choledocholithiasis [K80.50] Encephalopathy [G93.40] Acute encephalopathy [G93.40] Patient Active Problem List   Diagnosis Date Noted   Mild late onset Alzheimer's dementia without  behavioral disturbance, psychotic disturbance, mood disturbance, or anxiety ([place]) 09/18/2022   Acute encephalopathy 09/17/2022   Impaired cognition 03/23/2022   Recurrent falls 01/20/2022   Tobacco use 01/20/2022   Alcohol use 01/20/2022   Acute renal failure superimposed on stage 3a chronic kidney disease ([place]) 01/20/2022   Anemia of chronic disease 01/20/2022   Dens fracture ([place]) 01/19/2022   COPD (chronic obstructive pulmonary disease) ([place]) 01/19/2022   Choledocholithiasis 11/03/2020   Diskitis 05/10/2020   Urinary incontinence 03/24/2020   Fecal incontinence 03/24/2020   Epidural abscess    Sepsis ([place]) 02/04/2020   Leg swelling 02/04/2020   Vertebral osteophyte 02/04/2020   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   Pulmonary mass 01/22/2020   PICC (peripherally inserted central catheter) in place 01/22/2020   Body mass index (BMI) 27.0-27.9, adult 01/16/2020   Elevated blood-pressure reading, without diagnosis of hypertension 01/16/2020   Paresthesia of right upper extremity 12/18/2019   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/2019   Status post laminectomy 12/18/2019   Insomnia 09/22/2019   Neck pain 11/04/2017   Other social stressor 07/19/2016   COPD with acute bronchitis ([place]) 10/04/2014   Hepatitis, unspecified 03/13/2014   Chronic low back pain    Knee pain, bilateral    Right knee DJD    Hepatitis 11/03/2013   Ectatic thoracic aorta ([place]) 11/03/2013  Aortic calcification ([facility]) 11/03/2013   Coronary atherosclerosis of native coronary artery 11/03/2013   Penile neoplasm    Ascending aortic aneurysm ([place])    Lung nodule 05/23/2012   COPD with acute exacerbation ([place]) 02/24/2012   PCP:  Carylon Perches, NP Pharmacy:   [id number] [place]Phone: [phone]: [phone]    Social Determinants of Health (SDOH) Interventions    Readmission Risk  Interventions    02/05/2020    1:49 PM  Readmission Risk Prevention Plan  Transportation Screening Complete  PCP or Specialist Appt within 3-5 Days Not Complete  Not Complete comments pending medical stability  [place]  Social Work Consult for [place]anning/Counseling Complete  Palliative Care Screening Not Applicable  Medication Review (Press photographer Referral to Pharmacy

## 2022-09-20 ENCOUNTER — Inpatient Hospital Stay (HOSPITAL_COMMUNITY): Payer: Medicare Other | Admitting: Certified Registered"

## 2022-09-20 ENCOUNTER — Encounter (HOSPITAL_COMMUNITY): Payer: Self-pay | Admitting: Family Medicine

## 2022-09-20 ENCOUNTER — Encounter (HOSPITAL_COMMUNITY)
Admission: EM | Disposition: A | Discharge status: Home Health | Payer: Self-pay | Source: Home / Self Care | Attending: Internal Medicine

## 2022-09-20 ENCOUNTER — Inpatient Hospital Stay (HOSPITAL_COMMUNITY): Payer: Medicare Other

## 2022-09-20 DIAGNOSIS — J449 Chronic obstructive pulmonary disease, unspecified: Secondary | ICD-10-CM

## 2022-09-20 DIAGNOSIS — K838 Other specified diseases of biliary tract: Secondary | ICD-10-CM

## 2022-09-20 DIAGNOSIS — Z87891 Personal history of nicotine dependence: Secondary | ICD-10-CM

## 2022-09-20 DIAGNOSIS — G8929 Other chronic pain: Secondary | ICD-10-CM

## 2022-09-20 DIAGNOSIS — R933 Abnormal findings on diagnostic imaging of other parts of digestive tract: Secondary | ICD-10-CM

## 2022-09-20 DIAGNOSIS — N1831 Chronic kidney disease, stage 3a: Secondary | ICD-10-CM

## 2022-09-20 DIAGNOSIS — N179 Acute kidney failure, unspecified: Secondary | ICD-10-CM

## 2022-09-20 DIAGNOSIS — M5440 Lumbago with sciatica, unspecified side: Secondary | ICD-10-CM

## 2022-09-20 DIAGNOSIS — K8051 Calculus of bile duct without cholangitis or cholecystitis with obstruction: Secondary | ICD-10-CM

## 2022-09-20 DIAGNOSIS — G9340 Encephalopathy, unspecified: Secondary | ICD-10-CM

## 2022-09-20 HISTORY — PX: REMOVAL OF STONES: SHX5545

## 2022-09-20 HISTORY — PX: ENDOSCOPIC RETROGRADE CHOLANGIOPANCREATOGRAPHY (ERCP) WITH PROPOFOL: SHX5810

## 2022-09-20 LAB — COMPREHENSIVE METABOLIC PANEL
ALT: 25 U/L (ref 0–44)
AST: 20 U/L (ref 15–41)
Albumin: 3.3 g/dL — ABNORMAL LOW (ref 3.5–5.0)
Alkaline Phosphatase: 111 U/L (ref 38–126)
Anion gap: 13 (ref 5–15)
BUN: 27 mg/dL — ABNORMAL HIGH (ref 8–23)
CO2: 23 mmol/L (ref 22–32)
Calcium: 8.9 mg/dL (ref 8.9–10.3)
Chloride: 103 mmol/L (ref 98–111)
Creatinine, Ser: 1.66 mg/dL — ABNORMAL HIGH (ref 0.61–1.24)
GFR, Estimated: 43 mL/min — ABNORMAL LOW (ref >60)
Glucose, Bld: 97 mg/dL (ref 70–99)
Potassium: 4.2 mmol/L (ref 3.5–5.1)
Sodium: 139 mmol/L (ref 135–145)
Total Bilirubin: 0.5 mg/dL (ref 0.3–1.2)
Total Protein: 6.5 g/dL (ref 6.5–8.1)

## 2022-09-20 SURGERY — ENDOSCOPIC RETROGRADE CHOLANGIOPANCREATOGRAPHY (ERCP) WITH PROPOFOL
Anesthesia: General

## 2022-09-20 MED ORDER — EPHEDRINE SULFATE-NACL 50-0.9 MG/10ML-% IV SOSY
PREFILLED_SYRINGE | INTRAVENOUS | Status: DC | PRN
  Administered 2022-09-20: 10 mg via INTRAVENOUS

## 2022-09-20 MED ORDER — GLUCAGON HCL RDNA (DIAGNOSTIC) 1 MG IJ SOLR
INTRAMUSCULAR | Status: DC | PRN
  Administered 2022-09-20 (×2): .25 mg via INTRAVENOUS

## 2022-09-20 MED ORDER — SUGAMMADEX SODIUM 200 MG/2ML IV SOLN
INTRAVENOUS | Status: DC | PRN
  Administered 2022-09-20: 280 mg via INTRAVENOUS

## 2022-09-20 MED ORDER — ROCURONIUM BROMIDE 100 MG/10ML IV SOLN
INTRAVENOUS | Status: DC | PRN
  Administered 2022-09-20: 50 mg via INTRAVENOUS
  Administered 2022-09-20: 10 mg via INTRAVENOUS

## 2022-09-20 MED ORDER — INDOMETHACIN 50 MG RE SUPP
RECTAL | Status: AC
  Filled 2022-09-20: qty 2

## 2022-09-20 MED ORDER — INDOMETHACIN 50 MG RE SUPP
RECTAL | Status: DC | PRN
  Administered 2022-09-20: 100 mg via RECTAL

## 2022-09-20 MED ORDER — PHENYLEPHRINE 80 MCG/ML (10ML) SYRINGE FOR IV PUSH (FOR BLOOD PRESSURE SUPPORT)
PREFILLED_SYRINGE | INTRAVENOUS | Status: DC | PRN
  Administered 2022-09-20 (×2): 160 ug via INTRAVENOUS

## 2022-09-20 MED ORDER — LIDOCAINE 2% (20 MG/ML) 5 ML SYRINGE
INTRAMUSCULAR | Status: DC | PRN
  Administered 2022-09-20: 25 mg via INTRAVENOUS

## 2022-09-20 MED ORDER — DICLOFENAC SUPPOSITORY 100 MG: 100.0000 mg | Freq: Once | RECTAL | Status: DC

## 2022-09-20 MED ORDER — FENTANYL CITRATE (PF) 100 MCG/2ML IJ SOLN
INTRAMUSCULAR | Status: DC | PRN
  Administered 2022-09-20 (×2): 50 ug via INTRAVENOUS

## 2022-09-20 MED ORDER — PROPOFOL 10 MG/ML IV BOLUS
INTRAVENOUS | Status: DC | PRN
  Administered 2022-09-20: 70 mg via INTRAVENOUS

## 2022-09-20 MED ORDER — LACTATED RINGERS IV SOLN: INTRAVENOUS | Status: DC | PRN

## 2022-09-20 MED ORDER — GLUCAGON HCL RDNA (DIAGNOSTIC) 1 MG IJ SOLR
INTRAMUSCULAR | Status: AC
  Filled 2022-09-20: qty 1

## 2022-09-20 NOTE — Plan of Care (Signed)

## 2022-09-20 NOTE — Evaluation (Signed)
Occupational Therapy Evaluation Patient Details Name: Kent Flowers MRN: [id number] DOB: 14-May-1946 Today's Date: 09/20/2022   History of Present Illness Kent Flowers is a 76 y.o. male presents to [facility] ED for conufsion and R facial droop, transferred to [facility]. CT of brain negative. 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 PMH: Alzheimer's dementia, COPD, CKD stage IIIa, thoracic aortic aneurysm, BPH, chronic pain, history of choledocholithiasis S/p ERCP with sphincterotomy 04/14/2021 followed by stent removal 06/24/2021   Clinical Impression   PTA patient reports independent with mobility using RW, ADLs, assist with IADLs from aide (driving and meals) but does reports managing his medications without assist. Per chart, pt manages meds with daughter overseeing (he reports not using pill box). He demonstrates ability to complete transfers with min guard assist, Adls with up to min assist.   Limited functional use of R UE at baseline, currently limited by balance, cognition, safety and activity tolerance. Pt is able to follow commands with increased time, but demonstrates poor awareness to safety, deficits with poor STM.  Believe he will benefit from continued OT service acutely and after dc at [facility] level given 24/7 support to optimize independence, safety and return to PLOF.      Recommendations for follow up therapy are one component of a multi-disciplinary discharge planning process, led by the attending physician.  Recommendations may be updated based on patient status, additional functional criteria and insurance authorization.   Follow Up Recommendations  Home health OT    Assistance Recommended at Discharge Frequent or constant Supervision/Assistance  Patient can return home with the following A little help with walking and/or transfers;A little help with bathing/dressing/bathroom;Assistance with cooking/housework;Direct  supervision/assist for medications management;Direct supervision/assist for financial management;Assist for transportation;Help with stairs or ramp for entrance    Functional Status Assessment  Patient has had a recent decline in their functional status and demonstrates the ability to make significant improvements in function in a reasonable and predictable amount of time.  Equipment Recommendations  None recommended by OT    Recommendations for Other Services       Precautions / Restrictions Precautions Precautions: Fall Precaution Comments: mild confusion Restrictions Weight Bearing Restrictions: No      Mobility Bed Mobility               General bed mobility comments: OOB in recliner upon entry    Transfers Overall transfer level: Needs assistance Equipment used: Rolling walker (2 wheels) Transfers: Sit to/from Stand Sit to Stand: Min guard           General transfer comment: min guard for safety, pt with good use of hands to push up on bed and then transfer to RW, increased time and trunk flexion      Balance Overall balance assessment: Needs assistance Sitting-balance support: Feet supported, No upper extremity supported Sitting balance-Leahy Scale: Fair     Standing balance support: Bilateral upper extremity supported, During functional activity Standing balance-Leahy Scale: Poor Standing balance comment: relies on BUE support dynamically                           ADL either performed or assessed with clinical judgement   ADL Overall ADL's : Needs assistance/impaired     Grooming: Min guard;Standing           Upper Body Dressing : Minimal assistance;Sitting   Lower Body Dressing: Minimal assistance;Sit to/from stand  Lower Body Dressing Details (indicate cue type and reason): pt needs assist with socks, but does not wear at baseline; min assist in standing Toilet Transfer: Min guard Toilet Transfer Details (indicate cue type and  reason): simulated in room         Functional mobility during ADLs: Min guard;Rolling walker (2 wheels) General ADL Comments: limited to standing only, pt declined further mobility     Vision   Vision Assessment?: No apparent visual deficits     Perception     Praxis      Pertinent Vitals/Pain Pain Assessment Pain Assessment: Faces Faces Pain Scale: Hurts a little bit Pain Location: R UE with movement Pain Descriptors / Indicators: Grimacing Pain Intervention(s): Monitored during session     Hand Dominance Left   Extremity/Trunk Assessment Upper Extremity Assessment Upper Extremity Assessment: RUE deficits/detail;Generalized weakness RUE Deficits / Details: deficits from 'surgery'; able to raise to approx 90* flexion, limited hand movement- baseline RUE Coordination: decreased fine motor;decreased gross motor   Lower Extremity Assessment Lower Extremity Assessment: Defer to PT evaluation   Cervical / Trunk Assessment Cervical / Trunk Assessment:  (hx surgyery, very rigid/stiff)   Communication Communication Communication: HOH   Cognition Arousal/Alertness: Awake/alert Behavior During Therapy: Flat affect Overall Cognitive Status: No family/caregiver present to determine baseline cognitive functioning Area of Impairment: Attention, Memory, Following commands, Safety/judgement, Awareness, Problem solving                 Orientation Level:  (pt refused to engage in orientation reports "I am not going through this again") Current Attention Level: Focused Memory: Decreased recall of precautions, Decreased short-term memory Following Commands: Follows one step commands consistently, Follows one step commands with increased time, Follows multi-step commands inconsistently Safety/Judgement: Decreased awareness of safety, Decreased awareness of deficits Awareness: Intellectual Problem Solving: Slow processing, Requires verbal cues, Difficulty sequencing General  Comments: pt with no recall of NPO this morning, reinforced several times during session with RN bringing swabs.  pt declines orientation questions, demonstrates decreased awareness of deficits but does reports "I can't remember anything"     General Comments  VSS, SPO2 at 90% on RA s/p amb, pt noted to have wet cough    Exercises     Shoulder Instructions      Home Living Family/patient expects to be discharged to:: Private residence Living Arrangements: Alone Available Help at Discharge: Family;Available PRN/intermittently;Personal care attendant (caregiver 4 hrs/day, 5 days/week, daugther there the other 2 days) Type of Home: House Home Access: Stairs to enter [employer] of Steps: 4 Entrance Stairs-Rails: Left Home Layout: One level     Bathroom Shower/Tub: Occupational psychologist: Handicapped height Bathroom Accessibility: Yes   Home Equipment: Conservation officer, nature (2 wheels);Shower seat;Grab bars - toilet;Grab bars - tub/shower          Prior Functioning/Environment Prior Level of Function : Needs assist             Mobility Comments: uses RW, reports caregiver to drive and do grocery shopping ADLs Comments: reports indep with use of shower seat, grab bars and RW, reports he does heat up meals; reports manages medications        OT Problem List: Decreased strength;Decreased activity tolerance;Impaired balance (sitting and/or standing);Decreased cognition;Decreased safety awareness;Decreased knowledge of use of DME or AE;Decreased knowledge of precautions;Impaired UE functional use      OT Treatment/Interventions: Self-care/ADL training;Energy conservation;DME and/or AE instruction;Therapeutic activities;Cognitive remediation/compensation;Patient/family education;Balance training    OT Goals(Current goals can be  found in the care plan section) Acute Rehab OT Goals Patient Stated Goal: home OT Goal Formulation: With patient Time For Goal  Achievement: 10/04/22 Potential to Achieve Goals: Fair  OT Frequency: Min 2X/week    Co-evaluation              AM-PAC OT "6 Clicks" Daily Activity     Outcome Measure Help from another person eating meals?: Total (NPO) Help from another person taking care of personal grooming?: A Little Help from another person toileting, which includes using toliet, bedpan, or urinal?: A Little Help from another person bathing (including washing, rinsing, drying)?: A Little Help from another person to put on and taking off regular upper body clothing?: A Little Help from another person to put on and taking off regular lower body clothing?: A Little 6 Click Score: 16   End of Session Equipment Utilized During Treatment: Rolling walker (2 wheels) Nurse Communication: Mobility status  Activity Tolerance: Patient tolerated treatment well Patient left: in chair;with call bell/phone within reach;with chair alarm set  OT Visit Diagnosis: Other abnormalities of gait and mobility (R26.89);Muscle weakness (generalized) (M62.81);Other symptoms and signs involving cognitive function                Time: 7408-1448 OT Time Calculation (min): 15 min Charges:  OT General Charges $OT Visit: 1 Visit OT Evaluation $OT Eval Moderate Complexity: 1 Mod  Jolaine Artist, OT Acute Rehabilitation Services Office [phone]   Delight Stare 09/20/2022, 10:15 AM

## 2022-09-20 NOTE — Interval H&P Note (Signed)
History and Physical Interval Note:  09/20/2022 2:01 PM  Kent Flowers  has presented today for surgery, with the diagnosis of Choledocholithiasis.  The various methods of treatment have been discussed with the patient and family. After consideration of risks, benefits and other options for treatment, the patient has consented to  Procedure(s): ENDOSCOPIC RETROGRADE CHOLANGIOPANCREATOGRAPHY (ERCP) WITH PROPOFOL (N/A) as a surgical intervention.  The patient's history has been reviewed, patient examined, no change in status, stable for surgery.  I have reviewed the patient's chart and labs.  Questions were answered to the patient's satisfaction.     Pricilla Riffle. Fuller Plan

## 2022-09-20 NOTE — Op Note (Signed)
[facility] Patient Name: Kent Flowers Procedure Date : 09/20/2022 MRN: [id number] Attending MD: Ladene Artist , MD Date of Birth: 1946-05-16 CSN: [id number] Age: 76 Admit Type: Inpatient Procedure:                ERCP Indications:              Bile duct stone(s), Biliary dilation on Computed                            Tomogram Scan, Abnormal MRCP Providers:                Pricilla Riffle. Fuller Plan, MD, Jaci Carrel, RN, Benetta Spar, Technician Referring MD:             [facility] Medicines:                General Anesthesia Complications:            No immediate complications. Estimated Blood Loss:     Estimated blood loss: none. Procedure:                Pre-Anesthesia Assessment:                           - Prior to the procedure, a History and Physical                            was performed, and patient medications and                            allergies were reviewed. The patient's tolerance of                            previous anesthesia was also reviewed. The risks                            and benefits of the procedure and the sedation                            options and risks were discussed with the patient.                            All questions were answered, and informed consent                            was obtained. Prior Anticoagulants: The patient has                            taken no previous anticoagulant or antiplatelet                            agents. ASA Grade Assessment: III - A patient with  severe systemic disease. After reviewing the risks                            and benefits, the patient was deemed in                            satisfactory condition to undergo the procedure.                           After obtaining informed consent, the scope was                            passed under direct vision. Throughout the                            procedure, the patient's blood  pressure, pulse, and                            oxygen saturations were monitored continuously. The                            TFJ-Q190V ([id number] Olympus duodenoscope was                            introduced through the mouth, and used to inject                            contrast into and used to inject contrast into the                            bile duct. The ERCP was accomplished without                            difficulty. The patient tolerated the procedure                            well. Scope In: Scope Out: Findings:      The scout film was normal. The scope was advanced to the major papilla,       with a prior sphincterotomy noted measuring 7 mm, in the descending       duodenum. Very limited examination of the pharynx, larynx and associated       structures, and upper GI tract was otherwise normal. A straight       Roadrunner wire was passed into the biliary tree. The short-nosed       traction sphincterotome was passed over the guidewire and the bile duct       was then deeply cannulated. Contrast was injected. I personally       interpreted the bile duct images. Ductal flow of contrast was adequate.       The common bile duct contained one stone, which was 18 mm in length, and       sludge. The common bile duct was diffusely dilated, with a stone and       sludge causing an obstruction. The largest CBD diameter was 16 mm. Pharmacist, community  was noted in the biliary tree. The biliary tree was otherwise normal       however the right intrahepatic system did not fill well and the       gallbladder did not fill. The biliary tree was swept several times with       a 12-15 mm balloon starting at the bifurcation. Sludge was swept from       the duct. The stone was removed. The final cholangiogram showed no       filling defects except air. The PD was not cannulated or injected by       intention. At the end of the procedure good biliary drainage was noted       and more air refluxed  into the biliary tree. Impression:               - Prior sphincterotomy.                           - The common bile duct was dilated, with a stone                            and sludge causing an obstruction.                           - Choledocholithiasis and sludge was found.                            Complete removal was accomplished by balloon                            extraction.                           - The biliary tree was swept several times. Recommendation:           - Return patient to hospital ward for ongoing care.                           - Observe patient's clinical course following                            today's ERCP with therapeutic intervention.                           - Consider ursodiol for biliary stasis.                           - Surgery evaluating for cholecystectomy.                           - Outpatient GI follow up with [facility]: Edythe Clarity, MD or Harley Hallmark, MD Procedure Code(s):        --- Professional ---  43264, Endoscopic retrograde                            cholangiopancreatography (ERCP); with removal of                            calculi/debris from biliary/pancreatic duct(s) Diagnosis Code(s):        --- Professional ---                           K80.51, Calculus of bile duct without cholangitis                            or cholecystitis with obstruction                           K83.8, Other specified diseases of biliary tract                           R93.2, Abnormal findings on diagnostic imaging of                            liver and biliary tract CPT copyright 2019 American Medical Association. All rights reserved. The codes documented in this report are preliminary and upon coder review may  be revised to meet current compliance requirements. Ladene Artist, MD 09/20/2022 3:30:51 PM This report has been signed electronically. Number of Addenda: 0

## 2022-09-20 NOTE — Anesthesia Preprocedure Evaluation (Addendum)
Anesthesia Evaluation  Patient identified by MRN, date of birth, ID band Patient awake    Reviewed: Allergy & Precautions, NPO status , Patient's Chart, lab work & pertinent test results  History of Anesthesia Complications Negative for: history of anesthetic complications  Airway Mallampati: I  TM Distance: >3 FB Neck ROM: Full    Dental  (+) Edentulous Upper, Edentulous Lower   Pulmonary COPD,  COPD inhaler, former smoker   breath sounds clear to auscultation       Cardiovascular + Peripheral Vascular Disease (4cm stable thoracic aortic aneurysm)   Rhythm:Regular Rate:Normal  '21 ECHO: EF 50-55%. The LV has low normal function.  borderline LVH, normal RVF, no significant valvular abnormalities   Neuro/Psych       Dementia negative neurological ROS     GI/Hepatic Choledocholithiasis Abd pain   Endo/Other  negative endocrine ROS    Renal/GU Renal InsufficiencyRenal disease     Musculoskeletal  (+) Arthritis ,    Abdominal   Peds  Hematology negative hematology ROS (+)   Anesthesia Other Findings   Reproductive/Obstetrics                             Anesthesia Physical Anesthesia Plan  ASA: 3  Anesthesia Plan: General   Post-op Pain Management:    Induction: Intravenous  PONV Risk Score and Plan: 2 and Ondansetron and Dexamethasone  Airway Management Planned: Oral ETT  Additional Equipment: None  Intra-op Plan:   Post-operative Plan: Extubation in OR  Informed Consent: I have reviewed the patients History and Physical, chart, labs and discussed the procedure including the risks, benefits and alternatives for the proposed anesthesia with the patient or authorized representative who has indicated his/her understanding and acceptance.   Patient has DNR.  Discussed DNR with patient, Suspend DNR and Discussed DNR with power of attorney.   Consent reviewed with POA  Plan  Discussed with: CRNA and Surgeon  Anesthesia Plan Comments: (Discussed with pt's son by telephone)       Anesthesia Quick Evaluation

## 2022-09-20 NOTE — Progress Notes (Signed)
SDOH Interventions Today    Flowsheet Row Most Recent Value  SDOH Interventions   Food Insecurity Interventions Inpatient TOC  [Family and pt deny any issues obtaining food--pt has caregiver and daughter that prepare his meals]

## 2022-09-20 NOTE — Evaluation (Signed)
Physical Therapy Evaluation Patient Details Name: Kent Flowers MRN: [id number] DOB: 1946-05-23 Today's Date: 09/20/2022  History of Present Illness  Kent Flowers is a 76 y.o. male presents to [facility] ED for conufsion and R facial droop, transferred to [facility]. CT of brain negative. 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 PMH: Alzheimer's dementia, COPD, CKD stage IIIa, thoracic aortic aneurysm, BPH, chronic pain, history of choledocholithiasis S/p ERCP with sphincterotomy 04/14/2021 followed by stent removal 06/24/2021   Clinical Impression  Pt admitted with above. Pt with mild confusion/comprehension of medical situation, more so medication management. Pt with  noted report of Alzheimer dementia in [employer] suspect this may be patients baseline. Pt functioning at [facility] for transfers and ADLs at this time. Per chart and pt report pt has a caregiver 4hr/day, 5x/wk and the daughter covers the other 2 days. At this time I'm recommending someone to be there 24/7 initially to ensure safety and minimize fall risk after hospital stay. Recommend HHPT to progress mobility and independence with function. Acute PT to cont to follow.       Recommendations for follow up therapy are one component of a multi-disciplinary discharge planning process, led by the attending physician.  Recommendations may be updated based on patient status, additional functional criteria and insurance authorization.  Follow Up Recommendations Home health PT      Assistance Recommended at Discharge Frequent or constant Supervision/Assistance (24/7 initially for first couple of days)  Patient can return home with the following  A little help with walking and/or transfers;A little help with bathing/dressing/bathroom;Assistance with cooking/housework;Direct supervision/assist for medications management;Help with stairs or ramp for entrance    Equipment Recommendations None recommended  by PT (has pt recommended DME)  Recommendations for Other Services       Functional Status Assessment Patient has had a recent decline in their functional status and/or demonstrates limited ability to make significant improvements in function in a reasonable and predictable amount of time     Precautions / Restrictions Precautions Precautions: Fall Precaution Comments: mild confusion Restrictions Weight Bearing Restrictions: No      Mobility  Bed Mobility Overal bed mobility: Needs Assistance Bed Mobility: Rolling, Sidelying to Sit Rolling: Min assist Sidelying to sit: Min assist       General bed mobility comments: max directional verbal cues to sequence rolling to sidelying, pt stating "I need help." despite verbal cues to use bed rail pt attempting to pull up on PT    Transfers Overall transfer level: Needs assistance Equipment used: Rolling walker (2 wheels) Transfers: Sit to/from Stand Sit to Stand: Min guard           General transfer comment: min guard for safety, pt with good use of hands to push up on bed and then transfer to RW, increased time and trunk flexion    Ambulation/Gait Ambulation/Gait assistance: Min guard, Min assist Gait Distance (Feet): 20 Feet Assistive device: Rolling walker (2 wheels) Gait Pattern/deviations: Step-through pattern, Decreased stride length, Trunk flexed Gait velocity: dec Gait velocity interpretation: <1.31 ft/sec, indicative of household ambulator   General Gait Details: pt with near shuffling pattern, short step height and length, minA to manage walker around obstacles, pt uses heel of R hand to support self on walker due to inability to grip due to deformity.  Stairs            Wheelchair Mobility    Modified Rankin (Stroke Patients Only)  Balance Overall balance assessment: Needs assistance Sitting-balance support: Feet supported, Single extremity supported Sitting balance-Leahy Scale: Fair Sitting  balance - Comments: pt able to reach down to feet to attempt to take offf socks without LOB                                     Pertinent Vitals/Pain Pain Assessment Pain Assessment: Faces Faces Pain Scale: Hurts a little bit Pain Location: L UE with movement Pain Descriptors / Indicators: Grimacing    Home Living Family/patient expects to be discharged to:: Private residence Living Arrangements: Alone Available Help at Discharge: Family;Available PRN/intermittently;Personal care attendant (has caregiver 4hrs/day, 5x/wk, dtr there the other 2 days) Type of Home: House Home Access: Stairs to enter Entrance Stairs-Rails: Left Entrance Stairs-Number of Steps: 4   Home Layout: One level Home Equipment: Conservation officer, nature (2 wheels);Shower seat;Grab bars - toilet;Grab bars - tub/shower      Prior Function Prior Level of Function : Needs assist             Mobility Comments: uses RW, reports caregiver to drive and do grocery shopping ADLs Comments: reports indep with use of shower seat, grab bars and RW, reports he does heat up meals     Hand Dominance   Dominant Hand: Left    Extremity/Trunk Assessment   Upper Extremity Assessment Upper Extremity Assessment: Defer to OT evaluation (Pt with noted R hand deformity from previous back surgery)    Lower Extremity Assessment Lower Extremity Assessment: Generalized weakness (tight bil hamstrings)    Cervical / Trunk Assessment Cervical / Trunk Assessment:  (h/o back surgery, very rigid/sitff)  Communication   Communication: HOH  Cognition Arousal/Alertness: Awake/alert Behavior During Therapy: Anxious (focused on using his inhaler) Overall Cognitive Status: Impaired/Different from baseline Area of Impairment: Orientation, Attention, Memory, Following commands, Safety/judgement, Awareness, Problem solving                 Orientation Level: Disoriented to, Time (pt states, "I dont care about that" when  asked the date. Pt unable to state season as well) Current Attention Level: Focused (distracted by desire to use his personal inhalers) Memory: Decreased short-term memory (h/o dementia) Following Commands: Follows one step commands with increased time, Follows one step commands consistently Safety/Judgement: Decreased awareness of safety, Decreased awareness of deficits Awareness: Emergent Problem Solving: Difficulty sequencing, Requires verbal cues, Requires tactile cues General Comments: pt reports "I can tell I"m getting ornary." Pt very focused on using his personal inhaler. RN called to room and retreived his inhalers provided by the hospital. RN attempted to explain why he couldn't use his personal ones to protect from accidental overdose, pt requiried multiple explainations to understand.        General Comments General comments (skin integrity, edema, etc.): VSS, SPO2 at 90% on RA s/p amb, pt noted to have wet cough    Exercises     Assessment/Plan    PT Assessment Patient needs continued PT services  PT Problem List Decreased strength;Decreased range of motion;Decreased activity tolerance;Decreased balance;Decreased mobility;Decreased cognition;Decreased safety awareness       PT Treatment Interventions DME instruction;Gait training;Stair training;Functional mobility training;Therapeutic activities;Therapeutic exercise;Balance training;Neuromuscular re-education    PT Goals (Current goals can be found in the Care Plan section)  Acute Rehab PT Goals Patient Stated Goal: home PT Goal Formulation: With patient Time For Goal Achievement: 10/04/22 Potential to Achieve Goals: Good  Frequency Min 3X/week     Co-evaluation               AM-PAC PT "6 Clicks" Mobility  Outcome Measure Help needed turning from your back to your side while in a flat bed without using bedrails?: A Little Help needed moving from lying on your back to sitting on the side of a flat bed  without using bedrails?: A Little Help needed moving to and from a bed to a chair (including a wheelchair)?: A Little Help needed standing up from a chair using your arms (e.g., wheelchair or bedside chair)?: A Little Help needed to walk in hospital room?: A Little Help needed climbing 3-5 steps with a railing? : A Lot 6 Click Score: 17    End of Session Equipment Utilized During Treatment: Gait belt Activity Tolerance: Patient tolerated treatment well Patient left: in chair;with call bell/phone within reach;with chair alarm set;with nursing/sitter in room Nurse Communication: Mobility status PT Visit Diagnosis: Unsteadiness on feet (R26.81);Muscle weakness (generalized) (M62.81);Difficulty in walking, not elsewhere classified (R26.2)    Time: 5436-0677 PT Time Calculation (min) (ACUTE ONLY): 32 min   Charges:   PT Evaluation $PT Eval Moderate Complexity: 1 Mod PT Treatments $Gait Training: 8-22 mins        Kittie Plater, PT, DPT Acute Rehabilitation Services Secure chat preferred Office #: [phone]   Berline Lopes 09/20/2022, 9:51 AM

## 2022-09-20 NOTE — Progress Notes (Signed)
Subjective/Chief Complaint: No complaints   Objective: Vital signs in last 24 hours: Temp:  [98.1 F (36.7 C)-100.3 F (37.9 C)] 98.1 F (36.7 C) (10/04 0323) Pulse Rate:  [58-77] 58 (10/04 0323) Resp:  [14-20] 17 (10/04 0323) BP: (113-150)/(57-89) 116/57 (10/04 0323) SpO2:  [92 %-95 %] 92 % (10/04 0323)    Intake/Output from previous day: 10/03 0701 - 10/04 0700 In: 515.2 [IV Piggyback:515.2] Out: 575 [Urine:575] Intake/Output this shift: No intake/output data recorded.  Abdomen soft minimally tender  Lab Results:  Recent Labs    09/17/22 1225 09/17/22 1424 09/19/22 0432  WBC 16.7*  --  10.4  HGB 16.2 16.0 15.6  HCT 49.5 47.0 46.6  PLT 308  --  218   BMET Recent Labs    09/19/22 0432 09/20/22 0614  NA 139 139  K 4.1 4.2  CL 104 103  CO2 24 23  GLUCOSE 96 97  BUN 23 27*  CREATININE 1.66* 1.66*  CALCIUM 8.5* 8.9   PT/INR Recent Labs    09/17/22 1225  LABPROT 13.5  INR 1.0   ABG Recent Labs    09/17/22 1424  HCO3 30.2*    Studies/Results: MR ABDOMEN MRCP WO CONTRAST  Result Date: 09/19/2022 CLINICAL DATA:  Cholelithiasis EXAM: MRI ABDOMEN WITHOUT CONTRAST  (INCLUDING MRCP) TECHNIQUE: Multiplanar multisequence MR imaging of the abdomen was performed. Heavily T2-weighted images of the biliary and pancreatic ducts were obtained, and three-dimensional MRCP images were rendered by post processing. COMPARISON:  CT abdomen pelvis dated September 17, 2022 FINDINGS: Lower chest: Right greater than left bibasilar atelectasis. Hepatobiliary: No suspicious focal liver lesions. Moderately dilated intrahepatic bile ducts and common bile duct, common bile duct measures up to 15 mm. Blunted affect of the distal common bile duct measuring 1.9 x 0.8 cm on series 5, image 17. Pancreas: No mass, inflammatory changes, or other parenchymal abnormality identified. Spleen: Scattered T2 hyperintense splenic lesions which are likely simple cysts. Normal size of the spleen.  Adrenals/Urinary Tract: Bilateral adrenal glands are unremarkable. Moderate left hydronephrosis and hydroureter, slightly increased hydronephrosis when compared with prior CT. No obstructing stone visualized. Stomach/Bowel: Visualized portions within the abdomen are unremarkable. Vascular/Lymphatic: No pathologically enlarged lymph nodes identified. Atherosclerotic disease. No abdominal aortic aneurysm demonstrated. Other: T2 hyperintense lesion located adjacent to the inferior vena cava measuring 3.0 x 2.0 cm on series 4, image 5, unchanged when compared with prior. Musculoskeletal: No suspicious bone lesions identified. IMPRESSION: 1. Moderate biliary ductal dilation with filling defect of the distal common bile duct measuring 1.9 x 0.8 cm, compatible with choledocholithiasis. 2. Moderate left hydronephrosis and hydroureter, slightly increased hydronephrosis when compared with prior CT. No obstructing stone visualized. Electronically Signed   By: Yetta Glassman M.D.   On: 09/19/2022 09:58   MR 3D Recon At Scanner  Result Date: 09/19/2022 CLINICAL DATA:  Cholelithiasis EXAM: MRI ABDOMEN WITHOUT CONTRAST  (INCLUDING MRCP) TECHNIQUE: Multiplanar multisequence MR imaging of the abdomen was performed. Heavily T2-weighted images of the biliary and pancreatic ducts were obtained, and three-dimensional MRCP images were rendered by post processing. COMPARISON:  CT abdomen pelvis dated September 17, 2022 FINDINGS: Lower chest: Right greater than left bibasilar atelectasis. Hepatobiliary: No suspicious focal liver lesions. Moderately dilated intrahepatic bile ducts and common bile duct, common bile duct measures up to 15 mm. Blunted affect of the distal common bile duct measuring 1.9 x 0.8 cm on series 5, image 17. Pancreas: No mass, inflammatory changes, or other parenchymal abnormality identified. Spleen: Scattered T2 hyperintense splenic  lesions which are likely simple cysts. Normal size of the spleen. Adrenals/Urinary  Tract: Bilateral adrenal glands are unremarkable. Moderate left hydronephrosis and hydroureter, slightly increased hydronephrosis when compared with prior CT. No obstructing stone visualized. Stomach/Bowel: Visualized portions within the abdomen are unremarkable. Vascular/Lymphatic: No pathologically enlarged lymph nodes identified. Atherosclerotic disease. No abdominal aortic aneurysm demonstrated. Other: T2 hyperintense lesion located adjacent to the inferior vena cava measuring 3.0 x 2.0 cm on series 4, image 5, unchanged when compared with prior. Musculoskeletal: No suspicious bone lesions identified. IMPRESSION: 1. Moderate biliary ductal dilation with filling defect of the distal common bile duct measuring 1.9 x 0.8 cm, compatible with choledocholithiasis. 2. Moderate left hydronephrosis and hydroureter, slightly increased hydronephrosis when compared with prior CT. No obstructing stone visualized. Electronically Signed   By: Yetta Glassman M.D.   On: 09/19/2022 09:58   MR BRAIN WO CONTRAST  Result Date: 09/19/2022 CLINICAL DATA:  Neuro deficit, acute, stroke suspected. EXAM: MRI HEAD WITHOUT CONTRAST TECHNIQUE: Multiplanar, multiecho pulse sequences of the brain and surrounding structures were obtained without intravenous contrast. COMPARISON:  Head CT September 17, 2022 FINDINGS: Brain: No acute infarction, hemorrhage, hydrocephalus, extra-axial collection or mass lesion. Scattered and confluent foci of T2 hyperintensity are seen within the white matter of the cerebral hemispheres, nonspecific, most likely related to chronic small vessel ischemia. Moderate parenchymal volume loss. Focus of susceptibility artifact in the right frontal lobe suggesting small hemosiderin deposit. Vascular: Normal flow voids. Skull and upper cervical spine: Type 2 odontoid fracture with mild posterior displacement of the superior fragment, similar to CT performed in February 2023. Sinuses/Orbits: Negative. Other: None.  IMPRESSION: 1. No acute intracranial abnormality. 2. Moderate chronic microvascular ischemic changes of the white matter. 3. Moderate parenchymal volume loss. 4. Type 2 odontoid fracture with mild posterior displacement of the superior fragment, similar to CT performed in February 2023. Electronically Signed   By: Pedro Earls M.D.   On: 09/19/2022 09:14    Anti-infectives: Anti-infectives (From admission, onward)    Start     Dose/Rate Route Frequency Ordered Stop   09/18/22 1930  cefTRIAXone (ROCEPHIN) 2 g in sodium chloride 0.9 % 100 mL IVPB        2 g 200 mL/hr over 30 Minutes Intravenous Every 24 hours 09/18/22 1831     09/17/22 1715  metroNIDAZOLE (FLAGYL) IVPB 500 mg        500 mg 100 mL/hr over 60 Minutes Intravenous Every 12 hours 09/17/22 1701     09/17/22 1715  ciprofloxacin (CIPRO) IVPB 400 mg  Status:  Discontinued        400 mg 200 mL/hr over 60 Minutes Intravenous Every 12 hours 09/17/22 1701 09/18/22 1831       Assessment/Plan: Choledocholithiasis, recurrent -ercp today -will discuss lap chole afterwards, I certainly think this needs to be considered for him.    I reviewed Consultant [facility] notes, hospitalist notes, last 24 h vitals and pain scores, last 48 h intake and output, last 24 h labs and trends, and last 24 h imaging results.  This care required straight-forward level of medical decision making.    Rolm Bookbinder 09/20/2022

## 2022-09-20 NOTE — Anesthesia Procedure Notes (Signed)
Procedure Name: Intubation Date/Time: 09/20/2022 2:33 PM  Performed by: Anastasio Auerbach, CRNAPre-anesthesia Checklist: Patient identified, Emergency Drugs available, Suction available and Patient being monitored Patient Re-evaluated:Patient Re-evaluated prior to induction Oxygen Delivery Method: Circle system utilized Preoxygenation: Pre-oxygenation with 100% oxygen Induction Type: IV induction Ventilation: Mask ventilation without difficulty Tube type: Oral Tube size: 7.5 mm Number of attempts: 1 Airway Equipment and Method: Stylet and Oral airway Placement Confirmation: ETT inserted through vocal cords under direct vision, positive ETCO2 and breath sounds checked- equal and bilateral Secured at: 22 cm Tube secured with: Tape Dental Injury: Teeth and Oropharynx as per pre-operative assessment

## 2022-09-20 NOTE — Progress Notes (Signed)
PROGRESS NOTE    Kent Flowers  NTI:[id number] DOB: October 30, 1946 DOA: 09/17/2022 PCP: Carylon Perches, NP   Brief Narrative:  76 y.o. male with medical history significant for Alzheimer's dementia, COPD, CKD stage IIIa, thoracic aortic aneurysm, BPH, chronic pain, history of choledocholithiasis S/p ERCP with sphincterotomy 04/14/2021 followed by stent removal 06/24/2021 presented for evaluation of confusion and right facial droop which subsequently resolved in the ED.  On presentation, he had leukocytosis with elevated LFTs.  CT of the head was negative for acute intracranial abnormality.  Chest x-ray was negative for focal consolidation, edema or effusion.  CT of abdomen/pelvis showed dilated CBD with likely choledocholithiasis given debris noted within this distal CBD; mild bilateral hydroureter with fullness of the left renal pelvis; indeterminate aortocaval 3.8 x 2.1 cm density at the level of aortic bifurcation.  Patient was started on IV fluids and antibiotics.  GI and general surgery were consulted.  Assessment & Plan:   Choledocholithiasis/elevated LFTs -Patient has history of recurrent choledocholithiasis requiring ERCP with sphincterotomy with stent placement and subsequent removal -Imaging on presentation as above. -Currently on Rocephin and Flagyl.  GI and general surgery following.  Plan for ERCP today. -Trend LFTs  Leukocytosis -Resolved.  No labs today  AKI on CKD stage IIIa -Creatinine 1.7 on admission compared to prior 1.3.  Slightly improving to 1.66 today.  Monitor.  Acute metabolic encephalopathy History of mild dementia/memory impairment -Presented with some confusion on background of mild dementia.  MRI of brain was negative for acute stroke. -Monitor mental status.  Fall precaution.  Continue donepezil and Seroquel.  Outpatient follow-up with neurology -Delirium precautions  COPD -Stable.  Continue current inhaled regimen  Chronic low back pain -On narcotics  chronically for many years.  Continue current pain regimen.  Physical deconditioning -PT eval  DVT prophylaxis: Hold heparin for possible GI intervention Code Status: Partial Family Communication: None at bedside Disposition Plan: Status is: Inpatient Remains inpatient appropriate because: Of severity of illness    Consultants: GI/general surgery  Procedures: None  Antimicrobials: Rocephin and Flagyl  Subjective: Patient seen and examined at bedside.  Poor historian.  No fever, vomiting, worsening abdominal pain or seizures reported.  Objective: Vitals:   09/19/22 2120 09/19/22 2334 09/20/22 0323 09/20/22 0826  BP: 113/85 (!) 150/70 (!) 116/57 130/82  Pulse: 67 60 (!) 58 66  Resp: '20 15 17 18  '$ Temp: 98.6 F (37 C) 98.2 F (36.8 C) 98.1 F (36.7 C) 97.9 F (36.6 C)  TempSrc: Oral Oral Oral Oral  SpO2: 93% 93% 92% 92%  Weight:      Height:        Intake/Output Summary (Last 24 hours) at 09/20/2022 1006 Last data filed at 09/20/2022 0819 Gross per 24 hour  Intake 515.17 ml  Output 1000 ml  Net -484.83 ml   Filed Weights   09/17/22 1222  Weight: 72.8 kg    Examination:  General exam: Appears calm and comfortable.  Currently on room air.  Looks chronically ill and deconditioned. Respiratory system: Bilateral decreased breath sounds at bases with some scattered crackles Cardiovascular system: S1 & S2 heard, mild remittent bradycardia present  gastrointestinal system: Abdomen is nondistended, soft and mildly tender in the right upper quadrant.  Normal bowel sounds heard. Extremities: No cyanosis, clubbing, edema  Central nervous system: Awake, slow to respond, poor historian.  No focal neurological deficits. Moving extremities Skin: No rashes, lesions or ulcers Psychiatry: Flat affect.  No signs of agitation currently.   Data Reviewed:  I have personally reviewed following labs and imaging studies  CBC: Recent Labs  Lab 09/17/22 1225 09/17/22 1424  09/19/22 0432  WBC 16.7*  --  10.4  NEUTROABS 13.6*  --   --   HGB 16.2 16.0 15.6  HCT 49.5 47.0 46.6  MCV 92.2  --  92.3  PLT 308  --  378   Basic Metabolic Panel: Recent Labs  Lab 09/17/22 1225 09/17/22 1424 09/19/22 0432 09/20/22 0614  NA 137 137 139 139  K 4.4 4.1 4.1 4.2  CL 101  --  104 103  CO2 27  --  24 23  GLUCOSE 116*  --  96 97  BUN 27*  --  23 27*  CREATININE 1.70*  --  1.66* 1.66*  CALCIUM 10.0  --  8.5* 8.9   GFR: Estimated Creatinine Clearance: 36.5 mL/min (A) (by C-G formula based on SCr of 1.66 mg/dL (H)). Liver Function Tests: Recent Labs  Lab 09/17/22 1225 09/19/22 0432 09/20/22 0614  AST 64* 26 20  ALT 66* 35 25  ALKPHOS 196* 125 111  BILITOT 1.7* 0.8 0.5  PROT 7.8 6.2* 6.5  ALBUMIN 4.5 3.2* 3.3*   No results for input(s): "LIPASE", "AMYLASE" in the last 168 hours. Recent Labs  Lab 09/17/22 1418  AMMONIA 34   Coagulation Profile: Recent Labs  Lab 09/17/22 1225  INR 1.0   Cardiac Enzymes: No results for input(s): "CKTOTAL", "CKMB", "CKMBINDEX", "TROPONINI" in the last 168 hours. BNP (last 3 results) No results for input(s): "PROBNP" in the last 8760 hours. HbA1C: No results for input(s): "HGBA1C" in the last 72 hours. CBG: Recent Labs  Lab 09/17/22 1219 09/18/22 1445  GLUCAP 117* 103*   Lipid Profile: No results for input(s): "CHOL", "HDL", "LDLCALC", "TRIG", "CHOLHDL", "LDLDIRECT" in the last 72 hours. Thyroid Function Tests: No results for input(s): "TSH", "T4TOTAL", "FREET4", "T3FREE", "THYROIDAB" in the last 72 hours. Anemia Panel: No results for input(s): "VITAMINB12", "FOLATE", "FERRITIN", "TIBC", "IRON", "RETICCTPCT" in the last 72 hours. Sepsis Labs: Recent Labs  Lab 09/17/22 1738 09/17/22 1956  LATICACIDVEN 0.7 0.6    Recent Results (from the past 240 hour(s))  Urine Culture     Status: None   Collection Time: 09/17/22  4:07 PM   Specimen: Urine, Clean Catch  Result Value Ref Range Status   Specimen  Description   Final    URINE, CLEAN CATCH Performed at [facility], [address] [place], [place] [address]    Special Requests   Final    NONE Performed at [facility], [address] [place], [place] [address]    Culture   Final    NO GROWTH Performed at [facility], [place] [address], [place], [place] [address]    Report Status 09/18/2022 FINAL  Final  Culture, blood (routine x 2)     Status: None (Preliminary result)   Collection Time: 09/17/22  5:38 PM   Specimen: BLOOD RIGHT ARM  Result Value Ref Range Status   Specimen Description BLOOD RIGHT ARM  Final   Special Requests   Final    BOTTLES DRAWN AEROBIC AND ANAEROBIC Blood Culture adequate volume   Culture   Final    NO GROWTH 3 DAYS Performed at [facility], [place] [address], [place], [place] [address]    Report Status PENDING  Incomplete         Radiology Studies: MR ABDOMEN MRCP WO CONTRAST  Result Date: 09/19/2022 CLINICAL DATA:  Cholelithiasis  EXAM: MRI ABDOMEN WITHOUT CONTRAST  (INCLUDING MRCP) TECHNIQUE: Multiplanar multisequence MR imaging of the abdomen was performed. Heavily T2-weighted images of the biliary and pancreatic ducts were obtained, and three-dimensional MRCP images were rendered by post processing. COMPARISON:  CT abdomen pelvis dated September 17, 2022 FINDINGS: Lower chest: Right greater than left bibasilar atelectasis. Hepatobiliary: No suspicious focal liver lesions. Moderately dilated intrahepatic bile ducts and common bile duct, common bile duct measures up to 15 mm. Blunted affect of the distal common bile duct measuring 1.9 x 0.8 cm on series 5, image 17. Pancreas: No mass, inflammatory changes, or other parenchymal abnormality identified. Spleen: Scattered T2 hyperintense splenic lesions which are likely simple cysts. Normal size of the spleen. Adrenals/Urinary Tract: Bilateral adrenal glands are unremarkable. Moderate left hydronephrosis and  hydroureter, slightly increased hydronephrosis when compared with prior CT. No obstructing stone visualized. Stomach/Bowel: Visualized portions within the abdomen are unremarkable. Vascular/Lymphatic: No pathologically enlarged lymph nodes identified. Atherosclerotic disease. No abdominal aortic aneurysm demonstrated. Other: T2 hyperintense lesion located adjacent to the inferior vena cava measuring 3.0 x 2.0 cm on series 4, image 5, unchanged when compared with prior. Musculoskeletal: No suspicious bone lesions identified. IMPRESSION: 1. Moderate biliary ductal dilation with filling defect of the distal common bile duct measuring 1.9 x 0.8 cm, compatible with choledocholithiasis. 2. Moderate left hydronephrosis and hydroureter, slightly increased hydronephrosis when compared with prior CT. No obstructing stone visualized. Electronically Signed   By: Yetta Glassman M.D.   On: 09/19/2022 09:58   MR 3D Recon At Scanner  Result Date: 09/19/2022 CLINICAL DATA:  Cholelithiasis EXAM: MRI ABDOMEN WITHOUT CONTRAST  (INCLUDING MRCP) TECHNIQUE: Multiplanar multisequence MR imaging of the abdomen was performed. Heavily T2-weighted images of the biliary and pancreatic ducts were obtained, and three-dimensional MRCP images were rendered by post processing. COMPARISON:  CT abdomen pelvis dated September 17, 2022 FINDINGS: Lower chest: Right greater than left bibasilar atelectasis. Hepatobiliary: No suspicious focal liver lesions. Moderately dilated intrahepatic bile ducts and common bile duct, common bile duct measures up to 15 mm. Blunted affect of the distal common bile duct measuring 1.9 x 0.8 cm on series 5, image 17. Pancreas: No mass, inflammatory changes, or other parenchymal abnormality identified. Spleen: Scattered T2 hyperintense splenic lesions which are likely simple cysts. Normal size of the spleen. Adrenals/Urinary Tract: Bilateral adrenal glands are unremarkable. Moderate left hydronephrosis and hydroureter,  slightly increased hydronephrosis when compared with prior CT. No obstructing stone visualized. Stomach/Bowel: Visualized portions within the abdomen are unremarkable. Vascular/Lymphatic: No pathologically enlarged lymph nodes identified. Atherosclerotic disease. No abdominal aortic aneurysm demonstrated. Other: T2 hyperintense lesion located adjacent to the inferior vena cava measuring 3.0 x 2.0 cm on series 4, image 5, unchanged when compared with prior. Musculoskeletal: No suspicious bone lesions identified. IMPRESSION: 1. Moderate biliary ductal dilation with filling defect of the distal common bile duct measuring 1.9 x 0.8 cm, compatible with choledocholithiasis. 2. Moderate left hydronephrosis and hydroureter, slightly increased hydronephrosis when compared with prior CT. No obstructing stone visualized. Electronically Signed   By: Yetta Glassman M.D.   On: 09/19/2022 09:58   MR BRAIN WO CONTRAST  Result Date: 09/19/2022 CLINICAL DATA:  Neuro deficit, acute, stroke suspected. EXAM: MRI HEAD WITHOUT CONTRAST TECHNIQUE: Multiplanar, multiecho pulse sequences of the brain and surrounding structures were obtained without intravenous contrast. COMPARISON:  Head CT September 17, 2022 FINDINGS: Brain: No acute infarction, hemorrhage, hydrocephalus, extra-axial collection or mass lesion. Scattered and confluent foci of T2 hyperintensity are seen within the white matter  of the cerebral hemispheres, nonspecific, most likely related to chronic small vessel ischemia. Moderate parenchymal volume loss. Focus of susceptibility artifact in the right frontal lobe suggesting small hemosiderin deposit. Vascular: Normal flow voids. Skull and upper cervical spine: Type 2 odontoid fracture with mild posterior displacement of the superior fragment, similar to CT performed in February 2023. Sinuses/Orbits: Negative. Other: None. IMPRESSION: 1. No acute intracranial abnormality. 2. Moderate chronic microvascular ischemic changes of  the white matter. 3. Moderate parenchymal volume loss. 4. Type 2 odontoid fracture with mild posterior displacement of the superior fragment, similar to CT performed in February 2023. Electronically Signed   By: Pedro Earls M.D.   On: 09/19/2022 09:14        Scheduled Meds:  donepezil  10 mg Oral QHS   fluticasone furoate-vilanterol  1 puff Inhalation Daily   QUEtiapine  25 mg Oral QHS   sodium chloride flush  3 mL Intravenous Q12H   tamsulosin  0.4 mg Oral QHS   umeclidinium bromide  1 puff Inhalation Daily   Continuous Infusions:  cefTRIAXone (ROCEPHIN)  IV 2 g (09/19/22 1851)   metronidazole 500 mg (09/20/22 0537)          Aline August, MD [facility] 09/20/2022, 10:06 AM '

## 2022-09-20 NOTE — Transfer of Care (Signed)
Immediate Anesthesia Transfer of Care Note  Patient: Kent Flowers  Procedure(s) Performed: ENDOSCOPIC RETROGRADE CHOLANGIOPANCREATOGRAPHY (ERCP) WITH PROPOFOL REMOVAL OF STONES  Patient Location: Endoscopy Unit  Anesthesia Type:General  Level of Consciousness: awake, patient cooperative and confused  At baseline Airway & Oxygen Therapy: Patient Spontanous Breathing  Post-op Assessment: Report given to RN and Post -op Vital signs reviewed and stable  Post vital signs: Reviewed and stable  Last Vitals:  Vitals Value Taken Time  BP 122/96   Temp    Pulse 82   Resp 22   SpO2 94     Last Pain:  Vitals:   09/20/22 1335  TempSrc: Temporal  PainSc: 4       Patients Stated Pain Goal: 3 (26/37/85 8850)  Complications: No notable events documented.

## 2022-09-20 NOTE — Anesthesia Postprocedure Evaluation (Signed)
Anesthesia Post Note  Patient: Kent Flowers  Procedure(s) Performed: ENDOSCOPIC RETROGRADE CHOLANGIOPANCREATOGRAPHY (ERCP) WITH PROPOFOL REMOVAL OF STONES     Patient location during evaluation: PACU Anesthesia Type: General Level of consciousness: awake and alert Pain management: pain level controlled Vital Signs Assessment: post-procedure vital signs reviewed and stable Respiratory status: spontaneous breathing, nonlabored ventilation, respiratory function stable and patient connected to nasal cannula oxygen Cardiovascular status: blood pressure returned to baseline and stable Postop Assessment: no apparent nausea or vomiting Anesthetic complications: no   No notable events documented.  Last Vitals:  Vitals:   09/20/22 1634 09/20/22 1840  BP: 128/71 (!) 129/57  Pulse: (!) 59 (!) 58  Resp:  18  Temp:    SpO2: 92% 91%    Last Pain:  Vitals:   09/20/22 1618  TempSrc: Oral  PainSc:                  Renelle Stegenga

## 2022-09-21 LAB — CBC WITH DIFFERENTIAL/PLATELET
Abs Immature Granulocytes: 0.03 10*3/uL (ref 0.00–0.07)
Basophils Absolute: 0 10*3/uL (ref 0.0–0.1)
Basophils Relative: 0 %
Eosinophils Absolute: 0.3 10*3/uL (ref 0.0–0.5)
Eosinophils Relative: 4 %
HCT: 44.3 % (ref 39.0–52.0)
Hemoglobin: 14.4 g/dL (ref 13.0–17.0)
Immature Granulocytes: 0 %
Lymphocytes Relative: 23 %
Lymphs Abs: 2.2 10*3/uL (ref 0.7–4.0)
MCH: 29.9 pg (ref 26.0–34.0)
MCHC: 32.5 g/dL (ref 30.0–36.0)
MCV: 92.1 fL (ref 80.0–100.0)
Monocytes Absolute: 0.7 10*3/uL (ref 0.1–1.0)
Monocytes Relative: 8 %
Neutro Abs: 6 10*3/uL (ref 1.7–7.7)
Neutrophils Relative %: 65 %
Platelets: 271 10*3/uL (ref 150–400)
RBC: 4.81 MIL/uL (ref 4.22–5.81)
RDW: 13.6 % (ref 11.5–15.5)
WBC: 9.3 10*3/uL (ref 4.0–10.5)
nRBC: 0 % (ref 0.0–0.2)

## 2022-09-21 LAB — COMPREHENSIVE METABOLIC PANEL
ALT: 38 U/L (ref 0–44)
AST: 36 U/L (ref 15–41)
Albumin: 3 g/dL — ABNORMAL LOW (ref 3.5–5.0)
Alkaline Phosphatase: 178 U/L — ABNORMAL HIGH (ref 38–126)
Anion gap: 8 (ref 5–15)
BUN: 36 mg/dL — ABNORMAL HIGH (ref 8–23)
CO2: 24 mmol/L (ref 22–32)
Calcium: 8.4 mg/dL — ABNORMAL LOW (ref 8.9–10.3)
Chloride: 105 mmol/L (ref 98–111)
Creatinine, Ser: 1.56 mg/dL — ABNORMAL HIGH (ref 0.61–1.24)
GFR, Estimated: 46 mL/min — ABNORMAL LOW (ref >60)
Glucose, Bld: 109 mg/dL — ABNORMAL HIGH (ref 70–99)
Potassium: 3.6 mmol/L (ref 3.5–5.1)
Sodium: 137 mmol/L (ref 135–145)
Total Bilirubin: 0.6 mg/dL (ref 0.3–1.2)
Total Protein: 6.1 g/dL — ABNORMAL LOW (ref 6.5–8.1)

## 2022-09-21 LAB — MAGNESIUM: Magnesium: 2 mg/dL (ref 1.7–2.4)

## 2022-09-21 MED ORDER — ACETAMINOPHEN 325 MG PO TABS
650.0000 mg | ORAL_TABLET | Freq: Four times a day (QID) | ORAL | Status: DC | PRN
  Administered 2022-09-21: 650 mg via ORAL
  Filled 2022-09-21: qty 2

## 2022-09-21 MED ORDER — URSODIOL 300 MG PO CAPS
300.0000 mg | ORAL_CAPSULE | Freq: Two times a day (BID) | ORAL | Status: DC
  Administered 2022-09-21 – 2022-09-22 (×2): 300 mg via ORAL
  Filled 2022-09-21 (×3): qty 1

## 2022-09-21 NOTE — Progress Notes (Signed)
Gastroenterology Inpatient Follow Up    Subjective: Denies abdominal pain. Able to eat well. Denies bleeding.  Objective: Vital signs in last 24 hours: Temp:  [97.3 F (36.3 C)-98.3 F (36.8 C)] 98.3 F (36.8 C) (10/05 0834) Pulse Rate:  [57-84] 71 (10/05 0834) Resp:  [15-18] 18 (10/05 0834) BP: (110-148)/(57-94) 119/67 (10/05 0834) SpO2:  [91 %-96 %] 91 % (10/05 0834) Weight:  [75.9 kg] 75.9 kg (10/05 0507) Last BM Date : 09/20/22  Intake/Output from previous day: 10/04 0701 - 10/05 0700 In: 1000 [I.V.:700; IV Piggyback:300] Out: 925 [Urine:925] Intake/Output this shift: No intake/output data recorded.  General appearance: alert and cooperative Resp: no increased WOB Cardio: regular rate GI: soft, non-tender; bowel sounds normal; no masses,  no organomegaly Extremities: no BLE edema  Lab Results: Recent Labs    09/19/22 0432 09/21/22 0456  WBC 10.4 9.3  HGB 15.6 14.4  HCT 46.6 44.3  PLT 218 271   BMET Recent Labs    09/19/22 0432 09/20/22 0614 09/21/22 0456  NA 139 139 137  K 4.1 4.2 3.6  CL 104 103 105  CO2 $Re'24 23 24  'DjT$ GLUCOSE 96 97 109*  BUN 23 27* 36*  CREATININE 1.66* 1.66* 1.56*  CALCIUM 8.5* 8.9 8.4*   LFT Recent Labs    09/21/22 0456  PROT 6.1*  ALBUMIN 3.0*  AST 36  ALT 38  ALKPHOS 178*  BILITOT 0.6   PT/INR No results for input(s): "LABPROT", "INR" in the last 72 hours. Hepatitis Panel No results for input(s): "HEPBSAG", "HCVAB", "HEPAIGM", "HEPBIGM" in the last 72 hours. C-Diff No results for input(s): "CDIFFTOX" in the last 72 hours.  Studies/Results: DG ERCP  Result Date: 09/20/2022 CLINICAL DATA:  Choledocholithiasis EXAM: ERCP TECHNIQUE: Multiple spot images obtained with the fluoroscopic device and submitted for interpretation post-procedure. FLUOROSCOPY: Radiation Exposure Index (as provided by the fluoroscopic device): 199.73 mGy Kerma COMPARISON:  MRCP 09/19/2022 FINDINGS: A total of 16 intraoperative saved images  are submitted for review. The images demonstrate a flexible duodenal scope in the descending duodenum with wire cannulation of the common duct. Cholangiography demonstrates biliary ductal dilatation with filling defects consistent with choledocholithiasis. Subsequent images confirm sphincterotomy and balloon sweeping of the common duct. Pneumobilia is present on the final image. This is not unexpected. IMPRESSION: 1. Choledocholithiasis. 2. ERCP with sphincterotomy and balloon sweeping of the common duct. These images were submitted for radiologic interpretation only. Please see the procedural report for the amount of contrast and the fluoroscopy time utilized. Electronically Signed   By: Jacqulynn Cadet M.D.   On: 09/20/2022 16:36    Medications: I have reviewed the patient's current medications. Scheduled:  diclofenac  100 mg Rectal Once   donepezil  10 mg Oral QHS   fluticasone furoate-vilanterol  1 puff Inhalation Daily   QUEtiapine  25 mg Oral QHS   sodium chloride flush  3 mL Intravenous Q12H   tamsulosin  0.4 mg Oral QHS   umeclidinium bromide  1 puff Inhalation Daily   Continuous:  cefTRIAXone (ROCEPHIN)  IV 2 g (09/20/22 1838)   metronidazole 500 mg (09/21/22 0529)   OEU:MPNTIRWER, ondansetron **OR** ondansetron (ZOFRAN) IV, oxyCODONE  ERCP 09/20/22: - Prior sphincterotomy. - The common bile duct was dilated, with a stone and sludge causing an obstruction. - Choledocholithiasis and sludge was found. Complete removal was accomplished by balloon extraction. - The biliary tree was swept several times.  Assessment/Plan: 76 year old male with history of dementia, COPD, CKD, thoracic aortic  aneurysm, chronic pain on opioid therapy presented with confusion and ab pain, found to have choledocholithiasis. Had mildly elevated LFTs upon arrival. CT and MRCP shows dilated CBD and CBD stone/debris. ERCP 10/4 with removal of CBD stone with balloon extraction. Patient is doing well today and has  no signs of post-ERCP complications. Alk phos is mildly elevated after the ERCP procedure but I expect this should downtrend in the next few days.  - Surgery is following for possible laparoscopic cholecystectomy later on during this hospitalization. - If surgery is not pursued, could consider ursodiol therapy - Patient follows with [facility] and could make an appt with them in the future - GI will sign off. Please call if any new questions arise.   LOS: 3 days   Sharyn Creamer 09/21/2022, 10:53 AM

## 2022-09-21 NOTE — Progress Notes (Signed)
PROGRESS NOTE    Kent Flowers  OAC:[id number] DOB: July 26, 1946 DOA: 09/17/2022 PCP: Carylon Perches, NP   Brief Narrative:  76 y.o. male with medical history significant for Alzheimer's dementia, COPD, CKD stage IIIa, thoracic aortic aneurysm, BPH, chronic pain, history of choledocholithiasis S/p ERCP with sphincterotomy 04/14/2021 followed by stent removal 06/24/2021 presented for evaluation of confusion and right facial droop which subsequently resolved in the ED.  On presentation, he had leukocytosis with elevated LFTs.  CT of the head was negative for acute intracranial abnormality.  Chest x-ray was negative for focal consolidation, edema or effusion.  CT of abdomen/pelvis showed dilated CBD with likely choledocholithiasis given debris noted within this distal CBD; mild bilateral hydroureter with fullness of the left renal pelvis; indeterminate aortocaval 3.8 x 2.1 cm density at the level of aortic bifurcation.  Patient was started on IV fluids and antibiotics.  GI and general surgery were consulted.  Assessment & Plan:   Choledocholithiasis/elevated LFTs -Patient has history of recurrent choledocholithiasis requiring ERCP with sphincterotomy with stent placement and subsequent removal -Imaging on presentation as above. -Currently on Rocephin and Flagyl.  GI and general surgery following.  Status post ERCP with removal of CBD stone and sludge -LFTs improved. -General surgery contemplating timing of surgery  Leukocytosis -Resolved.    AKI on CKD stage IIIa -Creatinine 1.7 on admission compared to prior 1.3.  Slightly improving to 1.56 today.  Monitor.  Acute metabolic encephalopathy History of mild dementia/memory impairment -Presented with some confusion on background of mild dementia.  MRI of brain was negative for acute stroke. -Monitor mental status.  Fall precaution.  Continue donepezil and Seroquel.  Outpatient follow-up with neurology -Delirium precautions  COPD -Stable.   Continue current inhaled regimen  Chronic low back pain -On narcotics chronically for many years.  Continue current pain regimen.  Physical deconditioning -PT recommends home health PT  DVT prophylaxis: SCDs Code Status: Partial Family Communication: None at bedside Disposition Plan: Status is: Inpatient Remains inpatient appropriate because: Of severity of illness    Consultants: GI/general surgery  Procedures: ERCP on 09/20/2022 Antimicrobials: Rocephin and Flagyl  Subjective: Patient seen and examined at bedside.  Poor historian.  No seizures, agitation, fever or vomiting reported. Objective: Vitals:   09/20/22 2219 09/20/22 2334 09/21/22 0409 09/21/22 0507  BP: 138/70 110/71 (!) 140/82   Pulse:  68 63   Resp: '18 18 16   '$ Temp: 97.9 F (36.6 C) (!) 97.3 F (36.3 C) 97.8 F (36.6 C)   TempSrc: Oral Oral Axillary   SpO2: 95% 94% 96%   Weight:    75.9 kg  Height:        Intake/Output Summary (Last 24 hours) at 09/21/2022 0832 Last data filed at 09/21/2022 0500 Gross per 24 hour  Intake 1000 ml  Output 500 ml  Net 500 ml    Filed Weights   09/17/22 1222 09/21/22 0507  Weight: 72.8 kg 75.9 kg    Examination:  General: On room air.  No distress.  Looks chronically ill and deconditioned. ENT/neck: No thyromegaly.  JVD is not elevated  respiratory: Decreased breath sounds at bases bilaterally with some crackles; no wheezing  CVS: S1-S2 heard, rate controlled currently Abdominal: Soft, mildly tender in right upper quadrant, slightly distended; no organomegaly,  bowel sounds are heard Extremities: Trace lower extremity edema; no cyanosis  CNS: Awake, slow to respond, poor historian.  No focal neurologic deficit.  Moves extremities Lymph: No obvious lymphadenopathy Skin: No obvious ecchymosis/lesions  psych: No  sign of agitation currently.  Affect is flat.   Musculoskeletal: No obvious joint swelling/deformity    Data Reviewed: I have personally reviewed  following labs and imaging studies  CBC: Recent Labs  Lab 09/17/22 1225 09/17/22 1424 09/19/22 0432 09/21/22 0456  WBC 16.7*  --  10.4 9.3  NEUTROABS 13.6*  --   --  6.0  HGB 16.2 16.0 15.6 14.4  HCT 49.5 47.0 46.6 44.3  MCV 92.2  --  92.3 92.1  PLT 308  --  218 161    Basic Metabolic Panel: Recent Labs  Lab 09/17/22 1225 09/17/22 1424 09/19/22 0432 09/20/22 0614 09/21/22 0456  NA 137 137 139 139 137  K 4.4 4.1 4.1 4.2 3.6  CL 101  --  104 103 105  CO2 27  --  '24 23 24  '$ GLUCOSE 116*  --  96 97 109*  BUN 27*  --  23 27* 36*  CREATININE 1.70*  --  1.66* 1.66* 1.56*  CALCIUM 10.0  --  8.5* 8.9 8.4*  MG  --   --   --   --  2.0    GFR: Estimated Creatinine Clearance: 38.8 mL/min (A) (by C-G formula based on SCr of 1.56 mg/dL (H)). Liver Function Tests: Recent Labs  Lab 09/17/22 1225 09/19/22 0432 09/20/22 0614 09/21/22 0456  AST 64* 26 20 36  ALT 66* 35 25 38  ALKPHOS 196* 125 111 178*  BILITOT 1.7* 0.8 0.5 0.6  PROT 7.8 6.2* 6.5 6.1*  ALBUMIN 4.5 3.2* 3.3* 3.0*    No results for input(s): "LIPASE", "AMYLASE" in the last 168 hours. Recent Labs  Lab 09/17/22 1418  AMMONIA 34    Coagulation Profile: Recent Labs  Lab 09/17/22 1225  INR 1.0    Cardiac Enzymes: No results for input(s): "CKTOTAL", "CKMB", "CKMBINDEX", "TROPONINI" in the last 168 hours. BNP (last 3 results) No results for input(s): "PROBNP" in the last 8760 hours. HbA1C: No results for input(s): "HGBA1C" in the last 72 hours. CBG: Recent Labs  Lab 09/17/22 1219 09/18/22 1445  GLUCAP 117* 103*    Lipid Profile: No results for input(s): "CHOL", "HDL", "LDLCALC", "TRIG", "CHOLHDL", "LDLDIRECT" in the last 72 hours. Thyroid Function Tests: No results for input(s): "TSH", "T4TOTAL", "FREET4", "T3FREE", "THYROIDAB" in the last 72 hours. Anemia Panel: No results for input(s): "VITAMINB12", "FOLATE", "FERRITIN", "TIBC", "IRON", "RETICCTPCT" in the last 72 hours. Sepsis Labs: Recent  Labs  Lab 09/17/22 1738 09/17/22 1956  LATICACIDVEN 0.7 0.6     Recent Results (from the past 240 hour(s))  Urine Culture     Status: None   Collection Time: 09/17/22  4:07 PM   Specimen: Urine, Clean Catch  Result Value Ref Range Status   Specimen Description   Final    URINE, CLEAN CATCH Performed at [facility], [address], [place], [place] [address]    Special Requests   Final    NONE Performed at [facility], [address], [place], [place] [address]    Culture   Final    NO GROWTH Performed at [facility], [place] [address] [place], [place] [address]    Report Status 09/18/2022 FINAL  Final  Culture, blood (routine x 2)     Status: None (Preliminary result)   Collection Time: 09/17/22  5:38 PM   Specimen: BLOOD RIGHT ARM  Result Value Ref Range Status   Specimen Description BLOOD RIGHT ARM  Final   Special Requests   Final  BOTTLES DRAWN AEROBIC AND ANAEROBIC Blood Culture adequate volume   Culture   Final    NO GROWTH 4 DAYS Performed at [facility], [place] [address], [place], [place] [address]    Report Status PENDING  Incomplete         Radiology Studies: DG ERCP  Result Date: 09/20/2022 CLINICAL DATA:  Choledocholithiasis EXAM: ERCP TECHNIQUE: Multiple spot images obtained with the fluoroscopic device and submitted for interpretation post-procedure. FLUOROSCOPY: Radiation Exposure Index (as provided by the fluoroscopic device): 199.73 mGy Kerma COMPARISON:  MRCP 09/19/2022 FINDINGS: A total of 16 intraoperative saved images are submitted for review. The images demonstrate a flexible duodenal scope in the descending duodenum with wire cannulation of the common duct. Cholangiography demonstrates biliary ductal dilatation with filling defects consistent with choledocholithiasis. Subsequent images confirm sphincterotomy and balloon sweeping of the common duct. Pneumobilia is present on the final image.  This is not unexpected. IMPRESSION: 1. Choledocholithiasis. 2. ERCP with sphincterotomy and balloon sweeping of the common duct. These images were submitted for radiologic interpretation only. Please see the procedural report for the amount of contrast and the fluoroscopy time utilized. Electronically Signed   By: Jacqulynn Cadet M.D.   On: 09/20/2022 16:36   MR ABDOMEN MRCP WO CONTRAST  Result Date: 09/19/2022 CLINICAL DATA:  Cholelithiasis EXAM: MRI ABDOMEN WITHOUT CONTRAST  (INCLUDING MRCP) TECHNIQUE: Multiplanar multisequence MR imaging of the abdomen was performed. Heavily T2-weighted images of the biliary and pancreatic ducts were obtained, and three-dimensional MRCP images were rendered by post processing. COMPARISON:  CT abdomen pelvis dated September 17, 2022 FINDINGS: Lower chest: Right greater than left bibasilar atelectasis. Hepatobiliary: No suspicious focal liver lesions. Moderately dilated intrahepatic bile ducts and common bile duct, common bile duct measures up to 15 mm. Blunted affect of the distal common bile duct measuring 1.9 x 0.8 cm on series 5, image 17. Pancreas: No mass, inflammatory changes, or other parenchymal abnormality identified. Spleen: Scattered T2 hyperintense splenic lesions which are likely simple cysts. Normal size of the spleen. Adrenals/Urinary Tract: Bilateral adrenal glands are unremarkable. Moderate left hydronephrosis and hydroureter, slightly increased hydronephrosis when compared with prior CT. No obstructing stone visualized. Stomach/Bowel: Visualized portions within the abdomen are unremarkable. Vascular/Lymphatic: No pathologically enlarged lymph nodes identified. Atherosclerotic disease. No abdominal aortic aneurysm demonstrated. Other: T2 hyperintense lesion located adjacent to the inferior vena cava measuring 3.0 x 2.0 cm on series 4, image 5, unchanged when compared with prior. Musculoskeletal: No suspicious bone lesions identified. IMPRESSION: 1. Moderate  biliary ductal dilation with filling defect of the distal common bile duct measuring 1.9 x 0.8 cm, compatible with choledocholithiasis. 2. Moderate left hydronephrosis and hydroureter, slightly increased hydronephrosis when compared with prior CT. No obstructing stone visualized. Electronically Signed   By: Yetta Glassman M.D.   On: 09/19/2022 09:58   MR 3D Recon At Scanner  Result Date: 09/19/2022 CLINICAL DATA:  Cholelithiasis EXAM: MRI ABDOMEN WITHOUT CONTRAST  (INCLUDING MRCP) TECHNIQUE: Multiplanar multisequence MR imaging of the abdomen was performed. Heavily T2-weighted images of the biliary and pancreatic ducts were obtained, and three-dimensional MRCP images were rendered by post processing. COMPARISON:  CT abdomen pelvis dated September 17, 2022 FINDINGS: Lower chest: Right greater than left bibasilar atelectasis. Hepatobiliary: No suspicious focal liver lesions. Moderately dilated intrahepatic bile ducts and common bile duct, common bile duct measures up to 15 mm. Blunted affect of the distal common bile duct measuring 1.9 x 0.8 cm on series 5, image 17. Pancreas: No mass, inflammatory changes,  or other parenchymal abnormality identified. Spleen: Scattered T2 hyperintense splenic lesions which are likely simple cysts. Normal size of the spleen. Adrenals/Urinary Tract: Bilateral adrenal glands are unremarkable. Moderate left hydronephrosis and hydroureter, slightly increased hydronephrosis when compared with prior CT. No obstructing stone visualized. Stomach/Bowel: Visualized portions within the abdomen are unremarkable. Vascular/Lymphatic: No pathologically enlarged lymph nodes identified. Atherosclerotic disease. No abdominal aortic aneurysm demonstrated. Other: T2 hyperintense lesion located adjacent to the inferior vena cava measuring 3.0 x 2.0 cm on series 4, image 5, unchanged when compared with prior. Musculoskeletal: No suspicious bone lesions identified. IMPRESSION: 1. Moderate biliary ductal  dilation with filling defect of the distal common bile duct measuring 1.9 x 0.8 cm, compatible with choledocholithiasis. 2. Moderate left hydronephrosis and hydroureter, slightly increased hydronephrosis when compared with prior CT. No obstructing stone visualized. Electronically Signed   By: Yetta Glassman M.D.   On: 09/19/2022 09:58        Scheduled Meds:  diclofenac  100 mg Rectal Once   donepezil  10 mg Oral QHS   fluticasone furoate-vilanterol  1 puff Inhalation Daily   QUEtiapine  25 mg Oral QHS   sodium chloride flush  3 mL Intravenous Q12H   tamsulosin  0.4 mg Oral QHS   umeclidinium bromide  1 puff Inhalation Daily   Continuous Infusions:  cefTRIAXone (ROCEPHIN)  IV 2 g (09/20/22 1838)   metronidazole 500 mg (09/21/22 0529)          Aline August, MD [facility] 09/21/2022, 8:32 AM '

## 2022-09-21 NOTE — Progress Notes (Signed)
1 Day Post-Op  Subjective: CC: Reports no abdominal pain, n/v since ercp. Tolerating diet. Has not been oob.  Patient asked I call his son to provide update.   Objective: Vital signs in last 24 hours: Temp:  [97.3 F (36.3 C)-98.3 F (36.8 C)] 98.3 F (36.8 C) (10/05 0834) Pulse Rate:  [57-84] 71 (10/05 0834) Resp:  [15-18] 18 (10/05 0834) BP: (110-148)/(57-94) 119/67 (10/05 0834) SpO2:  [91 %-96 %] 91 % (10/05 0834) Weight:  [75.9 kg] 75.9 kg (10/05 0507) Last BM Date : 09/20/22  Intake/Output from previous day: 10/04 0701 - 10/05 0700 In: 1000 [I.V.:700; IV Piggyback:300] Out: 925 [Urine:925] Intake/Output this shift: No intake/output data recorded.  PE: Gen:  Alert, NAD, pleasant Abd: Soft, ND, NT, +BS Psych: A&Ox4  Lab Results:  Recent Labs    09/19/22 0432 09/21/22 0456  WBC 10.4 9.3  HGB 15.6 14.4  HCT 46.6 44.3  PLT 218 271   BMET Recent Labs    09/20/22 0614 09/21/22 0456  NA 139 137  K 4.2 3.6  CL 103 105  CO2 23 24  GLUCOSE 97 109*  BUN 27* 36*  CREATININE 1.66* 1.56*  CALCIUM 8.9 8.4*   PT/INR No results for input(s): "LABPROT", "INR" in the last 72 hours. CMP     Component Value Date/Time   NA 137 09/21/2022 0456   K 3.6 09/21/2022 0456   CL 105 09/21/2022 0456   CO2 24 09/21/2022 0456   GLUCOSE 109 (H) 09/21/2022 0456   BUN 36 (H) 09/21/2022 0456   CREATININE 1.56 (H) 09/21/2022 0456   CREATININE 1.19 (H) 05/13/2021 1402   CALCIUM 8.4 (L) 09/21/2022 0456   PROT 6.1 (L) 09/21/2022 0456   ALBUMIN 3.0 (L) 09/21/2022 0456   AST 36 09/21/2022 0456   ALT 38 09/21/2022 0456   ALKPHOS 178 (H) 09/21/2022 0456   BILITOT 0.6 09/21/2022 0456   GFRNONAA 46 (L) 09/21/2022 0456   GFRNONAA 60 05/13/2021 1402   GFRAA 70 05/13/2021 1402   Lipase  No results found for: "LIPASE"  Studies/Results: DG ERCP  Result Date: 09/20/2022 CLINICAL DATA:  Choledocholithiasis EXAM: ERCP TECHNIQUE: Multiple spot images obtained with the  fluoroscopic device and submitted for interpretation post-procedure. FLUOROSCOPY: Radiation Exposure Index (as provided by the fluoroscopic device): 199.73 mGy Kerma COMPARISON:  MRCP 09/19/2022 FINDINGS: A total of 16 intraoperative saved images are submitted for review. The images demonstrate a flexible duodenal scope in the descending duodenum with wire cannulation of the common duct. Cholangiography demonstrates biliary ductal dilatation with filling defects consistent with choledocholithiasis. Subsequent images confirm sphincterotomy and balloon sweeping of the common duct. Pneumobilia is present on the final image. This is not unexpected. IMPRESSION: 1. Choledocholithiasis. 2. ERCP with sphincterotomy and balloon sweeping of the common duct. These images were submitted for radiologic interpretation only. Please see the procedural report for the amount of contrast and the fluoroscopy time utilized. Electronically Signed   By: Jacqulynn Cadet M.D.   On: 09/20/2022 16:36    Anti-infectives: Anti-infectives (From admission, onward)    Start     Dose/Rate Route Frequency Ordered Stop   09/18/22 1930  cefTRIAXone (ROCEPHIN) 2 g in sodium chloride 0.9 % 100 mL IVPB        2 g 200 mL/hr over 30 Minutes Intravenous Every 24 hours 09/18/22 1831     09/17/22 1715  metroNIDAZOLE (FLAGYL) IVPB 500 mg        500 mg 100 mL/hr over 60 Minutes Intravenous  Every 12 hours 09/17/22 1701     09/17/22 1715  ciprofloxacin (CIPRO) IVPB 400 mg  Status:  Discontinued        400 mg 200 mL/hr over 60 Minutes Intravenous Every 12 hours 09/17/22 1701 09/18/22 1831        Assessment/Plan Choledocholithiasis, recurrent  - S/p ERCP w/ choledocholithiasis and sludge removed  - I discussed with the patient (who is A&O x 4 but has reported acute metabolic encephalopathy on notes and hx of mild dementia/memory impairment) and his son, Caven Perine, about recommendations for a Laparoscopic Cholecystectomy given his  recurrent Choledocholithiasis. We discussed surgery and the indications for surgery including to prevent recurrent Choledocholithiasis and it's potential complications (pancreatitis, cholangitis, sepsis, death).  Patient and his son asked appropriate questions. They would like to discuss with the rest of the family more and come to a collective decision later today. I think this is reasonable. GI recommending Ursodiol if surgery is not elected for. We will follow. Abx per GI (does not need from our standpoint, WBC wnl and there was no evidence of Cholecystitis on imaging). Can have diet as tolerated currently.  FEN: Okay for diet VTE: SCDs, okay for chem ppx from our standpoint ID: Per GI, as above   LOS: 3 days    Jillyn Ledger , [facility] Surgery 09/21/2022, 11:32 AM Please see Amion for pager number during day hours 7:00am-4:30pm

## 2022-09-21 NOTE — Care Management Important Message (Signed)
Important Message  Patient Details  Name: Kent Flowers MRN: [id number] Date of Birth: [birth year]   Medicare Important Message Given:  Yes     Trishelle Devora 09/21/2022, 11:28 AM

## 2022-09-21 NOTE — Progress Notes (Signed)
Physical Therapy Treatment Patient Details Name: Kent Flowers MRN: [id number] DOB: 1946-03-13 Today's Date: 09/21/2022   History of Present Illness Pt is a 76 y.o. male who presents to [facility] ED 09/17/22 for conufsion and R facial droop, transferred to [facility]. CT of brain negative. 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 PMH: Alzheimer's dementia, COPD, CKD stage IIIa, thoracic aortic aneurysm, BPH, chronic pain, history of choledocholithiasis S/p ERCP with sphincterotomy 04/14/2021 followed by stent removal 06/24/2021.    PT Comments    Pt progressing towards physical therapy goals. Cognitive deficits continue to be apparent. Per pt request, focus of session was sitting on BSC to have a BM. He was successful in this, but unable to attempt peri-care without assistance. May have been more cognitive related vs physical ability. Will continue to follow and progress as able per POC.    Recommendations for follow up therapy are one component of a multi-disciplinary discharge planning process, led by the attending physician.  Recommendations may be updated based on patient status, additional functional criteria and insurance authorization.  Follow Up Recommendations  Home health PT     Assistance Recommended at Discharge Frequent or constant Supervision/Assistance (24/7 initially for first couple of days)  Patient can return home with the following A little help with walking and/or transfers;A little help with bathing/dressing/bathroom;Assistance with cooking/housework;Direct supervision/assist for medications management;Help with stairs or ramp for entrance   Equipment Recommendations  None recommended by PT (has pt recommended DME)    Recommendations for Other Services       Precautions / Restrictions Precautions Precautions: Fall Precaution Comments: mild confusion Restrictions Weight Bearing Restrictions: No     Mobility  Bed Mobility Overal bed  mobility: Needs Assistance Bed Mobility: Supine to Sit     Supine to sit: Min assist     General bed mobility comments: Assist for transition to EOB. Pt required several cues to continue with transfer as pt forgetting what he was doing, asking "what am I doing again?"    Transfers Overall transfer level: Needs assistance Equipment used: Rolling walker (2 wheels) Transfers: Sit to/from Stand Sit to Stand: Min assist           General transfer comment: VC's for hand placement on seated surface for safety. Min assist for balance support. Pt able to take pivotal steps around to the [facility] with assist for walker management and balance. Cues throughout for sequencing.    Ambulation/Gait Ambulation/Gait assistance: Min assist Gait Distance (Feet): 20 Feet Assistive device: Rolling walker (2 wheels) Gait Pattern/deviations: Step-through pattern, Decreased stride length, Trunk flexed Gait velocity: Decreased Gait velocity interpretation: <1.31 ft/sec, indicative of household ambulator   General Gait Details: Ambulation around the room from [facility] to recliner. Pt with difficulty navigating room, asking "the bed is between me and the chair. How am I going to get over there?" after therapist attempted to point out the path around the foot of the bed.   Stairs             Wheelchair Mobility    Modified Rankin (Stroke Patients Only)       Balance Overall balance assessment: Needs assistance Sitting-balance support: Feet supported, No upper extremity supported Sitting balance-Leahy Scale: Fair     Standing balance support: Bilateral upper extremity supported, During functional activity Standing balance-Leahy Scale: Poor Standing balance comment: relies on BUE support dynamically  Cognition Arousal/Alertness: Awake/alert Behavior During Therapy: Flat affect Overall Cognitive Status: No family/caregiver present to determine baseline  cognitive functioning Area of Impairment: Attention, Memory, Following commands, Safety/judgement, Awareness, Problem solving                 Orientation Level:  (Generally disoriented, not answering orientation questions.) Current Attention Level: Focused Memory: Decreased recall of precautions, Decreased short-term memory Following Commands: Follows one step commands consistently, Follows one step commands with increased time, Follows multi-step commands inconsistently Safety/Judgement: Decreased awareness of safety, Decreased awareness of deficits Awareness: Intellectual Problem Solving: Slow processing, Requires verbal cues, Difficulty sequencing          Exercises General Exercises - Lower Extremity Long Arc Quad: 10 reps    General Comments        Pertinent Vitals/Pain Pain Assessment Pain Assessment: Faces Faces Pain Scale: Hurts a little bit Pain Location: R UE with movement Pain Descriptors / Indicators: Grimacing Pain Intervention(s): Limited activity within patient's tolerance, Monitored during session, Repositioned    Home Living                          Prior Function            PT Goals (current goals can now be found in the care plan section) Acute Rehab PT Goals Patient Stated Goal: home PT Goal Formulation: With patient Time For Goal Achievement: 10/04/22 Potential to Achieve Goals: Good Progress towards PT goals: Progressing toward goals    Frequency    Min 3X/week      PT Plan Current plan remains appropriate    Co-evaluation              AM-PAC PT "6 Clicks" Mobility   Outcome Measure  Help needed turning from your back to your side while in a flat bed without using bedrails?: A Little Help needed moving from lying on your back to sitting on the side of a flat bed without using bedrails?: A Little Help needed moving to and from a bed to a chair (including a wheelchair)?: A Little Help needed standing up from a  chair using your arms (e.g., wheelchair or bedside chair)?: A Little Help needed to walk in hospital room?: A Little Help needed climbing 3-5 steps with a railing? : A Lot 6 Click Score: 17    End of Session Equipment Utilized During Treatment: Gait belt Activity Tolerance: Patient tolerated treatment well Patient left: in chair;with call bell/phone within reach;with chair alarm set;with nursing/sitter in room Nurse Communication: Mobility status PT Visit Diagnosis: Unsteadiness on feet (R26.81);Muscle weakness (generalized) (M62.81);Difficulty in walking, not elsewhere classified (R26.2)     Time: 6579-0383 PT Time Calculation (min) (ACUTE ONLY): 28 min  Charges:  $Gait Training: 23-37 mins                     Rolinda Roan, PT, DPT Acute Rehabilitation Services Secure Chat Preferred Office: [phone]    Thelma Comp 09/21/2022, 2:14 PM

## 2022-09-22 LAB — COMPREHENSIVE METABOLIC PANEL
ALT: 32 U/L (ref 0–44)
AST: 28 U/L (ref 15–41)
Albumin: 3.1 g/dL — ABNORMAL LOW (ref 3.5–5.0)
Alkaline Phosphatase: 153 U/L — ABNORMAL HIGH (ref 38–126)
Anion gap: 11 (ref 5–15)
BUN: 34 mg/dL — ABNORMAL HIGH (ref 8–23)
CO2: 24 mmol/L (ref 22–32)
Calcium: 8.9 mg/dL (ref 8.9–10.3)
Chloride: 105 mmol/L (ref 98–111)
Creatinine, Ser: 1.67 mg/dL — ABNORMAL HIGH (ref 0.61–1.24)
GFR, Estimated: 43 mL/min — ABNORMAL LOW (ref >60)
Glucose, Bld: 119 mg/dL — ABNORMAL HIGH (ref 70–99)
Potassium: 3.7 mmol/L (ref 3.5–5.1)
Sodium: 140 mmol/L (ref 135–145)
Total Bilirubin: 0.2 mg/dL — ABNORMAL LOW (ref 0.3–1.2)
Total Protein: 6.2 g/dL — ABNORMAL LOW (ref 6.5–8.1)

## 2022-09-22 LAB — CBC WITH DIFFERENTIAL/PLATELET
Abs Immature Granulocytes: 0.03 10*3/uL (ref 0.00–0.07)
Basophils Absolute: 0.1 10*3/uL (ref 0.0–0.1)
Basophils Relative: 1 %
Eosinophils Absolute: 0.4 10*3/uL (ref 0.0–0.5)
Eosinophils Relative: 5 %
HCT: 44.5 % (ref 39.0–52.0)
Hemoglobin: 14.4 g/dL (ref 13.0–17.0)
Immature Granulocytes: 0 %
Lymphocytes Relative: 24 %
Lymphs Abs: 2 10*3/uL (ref 0.7–4.0)
MCH: 30.1 pg (ref 26.0–34.0)
MCHC: 32.4 g/dL (ref 30.0–36.0)
MCV: 93.1 fL (ref 80.0–100.0)
Monocytes Absolute: 0.7 10*3/uL (ref 0.1–1.0)
Monocytes Relative: 8 %
Neutro Abs: 5.2 10*3/uL (ref 1.7–7.7)
Neutrophils Relative %: 62 %
Platelets: 260 10*3/uL (ref 150–400)
RBC: 4.78 MIL/uL (ref 4.22–5.81)
RDW: 13.7 % (ref 11.5–15.5)
WBC: 8.3 10*3/uL (ref 4.0–10.5)
nRBC: 0 % (ref 0.0–0.2)

## 2022-09-22 LAB — CULTURE, BLOOD (ROUTINE X 2)
Culture: NO GROWTH
Special Requests: ADEQUATE

## 2022-09-22 LAB — MAGNESIUM: Magnesium: 2 mg/dL (ref 1.7–2.4)

## 2022-09-22 MED ORDER — MELATONIN 3 MG PO TABS
3.0000 mg | ORAL_TABLET | Freq: Every evening | ORAL | Status: DC | PRN
  Administered 2022-09-22: 3 mg via ORAL
  Filled 2022-09-22: qty 1

## 2022-09-22 MED ORDER — URSODIOL 300 MG PO CAPS: 300.0000 mg | ORAL_CAPSULE | Freq: Two times a day (BID) | ORAL | 0 refills | Status: DC

## 2022-09-22 NOTE — Progress Notes (Signed)
Occupational Therapy Treatment Patient Details Name: Kent Flowers MRN: [id number] DOB: Sep 02, 1946 Today's Date: 09/22/2022   History of present illness Pt is a 76 y.o. male who presents to [place] ED 09/17/22 for conufsion and R facial droop, transferred to [facility]. CT of brain negative. 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 PMH: Alzheimer's dementia, COPD, CKD stage IIIa, thoracic aortic aneurysm, BPH, chronic pain, history of choledocholithiasis S/p ERCP with sphincterotomy 04/14/2021 followed by stent removal 06/24/2021.   OT comments  Patient completing transfers and mobility with min guard using RW today, min cueing to utilize RW after grooming tasks at sink. Completing 2/3 step task with assist to recall 3rd task (going to sit in the recliner).  Attempted pill box test with pt refusing after reading medications labels-- at this time recommend pt have assist for med mgmt and IADLs.  Highly recommend increased assist at home, initial 24/7 supervision. Will follow acutely.    Recommendations for follow up therapy are one component of a multi-disciplinary discharge planning process, led by the attending physician.  Recommendations may be updated based on patient status, additional functional criteria and insurance authorization.    Follow Up Recommendations  Home health OT    Assistance Recommended at Discharge Frequent or constant Supervision/Assistance  Patient can return home with the following  A little help with walking and/or transfers;A little help with bathing/dressing/bathroom;Assistance with cooking/housework;Direct supervision/assist for medications management;Direct supervision/assist for financial management;Assist for transportation;Help with stairs or ramp for entrance   Equipment Recommendations  None recommended by OT    Recommendations for Other Services      Precautions / Restrictions Precautions Precautions:  Fall Restrictions Weight Bearing Restrictions: No       Mobility Bed Mobility Overal bed mobility: Needs Assistance Bed Mobility: Supine to Sit     Supine to sit: Supervision     General bed mobility comments: for safety    Transfers Overall transfer level: Needs assistance Equipment used: Rolling walker (2 wheels) Transfers: Sit to/from Stand Sit to Stand: Min guard           General transfer comment: increased effort to ascend with mulitple attempts required but no physical assist required     Balance Overall balance assessment: Needs assistance Sitting-balance support: Feet supported, No upper extremity supported Sitting balance-Leahy Scale: Fair     Standing balance support: No upper extremity supported, Bilateral upper extremity supported, During functional activity Standing balance-Leahy Scale: Poor Standing balance comment: relies on BUE support dynamically                           ADL either performed or assessed with clinical judgement   ADL Overall ADL's : Needs assistance/impaired     Grooming: Min guard;Standing;Wash/dry Geophysical data processor Transfer: Min guard;Ambulation;Rolling walker (2 wheels) Toilet Transfer Details (indicate cue type and reason): simulated to recliner         Functional mobility during ADLs: Min guard;Rolling walker (2 wheels) General ADL Comments: min guard for RW mgmt and use, safety    Extremity/Trunk Assessment              Vision   Additional Comments: cueing to visually scan to avoid objects when ambulating in room.   Perception     Praxis      Cognition Arousal/Alertness: Awake/alert Behavior During Therapy:  Flat affect Overall Cognitive Status: No family/caregiver present to determine baseline cognitive functioning Area of Impairment: Attention, Memory, Following commands, Safety/judgement, Awareness, Problem solving                   Current Attention Level:  Sustained Memory: Decreased short-term memory, Decreased recall of precautions Following Commands: Follows one step commands consistently, Follows one step commands with increased time, Follows multi-step commands inconsistently Safety/Judgement: Decreased awareness of safety, Decreased awareness of deficits Awareness: Emergent Problem Solving: Slow processing, Requires verbal cues, Difficulty sequencing General Comments: not asking orientation questions.  attempted pill box test with pt reading directions on all bottles then refusing to engage in task.  he completes 2/3 steps in provided task (get out of bed, wash hands, go sit in chair)--unable to recall 3rd task.  Requires cueing to use RW after engaging in ADls at sink. Poor safety with RW mgmt/awareness of surroundings.        Exercises      Shoulder Instructions       General Comments pt reports completing med mgmt on his own, reviwed with RN and CM pt does need assist with med mgmt and IADLs, recommend increased supervision at home    Pertinent Vitals/ Pain       Pain Assessment Pain Assessment: Faces Faces Pain Scale: No hurt Pain Intervention(s): Monitored during session  Home Living                                          Prior Functioning/Environment              Frequency  Min 2X/week        Progress Toward Goals  OT Goals(current goals can now be found in the care plan section)  Progress towards OT goals: Progressing toward goals  Acute Rehab OT Goals Patient Stated Goal: home today OT Goal Formulation: With patient Time For Goal Achievement: 10/04/22 Potential to Achieve Goals: [place] Discharge plan remains appropriate;Frequency remains appropriate    Co-evaluation                 AM-PAC OT "6 Clicks" Daily Activity     Outcome Measure   Help from another person eating meals?: A Little Help from another person taking care of personal grooming?: A Little Help from  another person toileting, which includes using toliet, bedpan, or urinal?: A Little Help from another person bathing (including washing, rinsing, drying)?: A Little Help from another person to put on and taking off regular upper body clothing?: A Little Help from another person to put on and taking off regular lower body clothing?: A Little 6 Click Score: 18    End of Session Equipment Utilized During Treatment: Rolling walker (2 wheels)  OT Visit Diagnosis: Other abnormalities of gait and mobility (R26.89);Muscle weakness (generalized) (M62.81);Other symptoms and signs involving cognitive function   Activity Tolerance Patient tolerated treatment well   Patient Left in chair;with call bell/phone within reach;with chair alarm set   Nurse Communication Mobility status        Time: 8127-5170 OT Time Calculation (min): 15 min  Charges: OT General Charges $OT Visit: 1 Visit OT Treatments $Self Care/Home Management : 8-22 mins  [place] Office [phone]   Delight Stare 09/22/2022, 10:40 AM

## 2022-09-22 NOTE — TOC Transition Note (Signed)
Transition of Care [facility]) - CM/SW Discharge Note   Patient Details  Name: Kent Flowers MRN: [id number] Date of Birth: 14-Sep-1946  Transition of Care [facility]) CM/SW Contact:  Pollie Friar, RN Phone Number: 09/22/2022, 10:05 AM   Clinical Narrative:    Pt is discharging home with home health services through [facility] home health. Information on the AVS.  CM will update pts family on monitoring medications at home and increased supervision at home.  Pts family to provide transport home.    Final next level of care: Home w Home Health Services Barriers to Discharge: No Barriers Identified   Patient Goals and CMS Choice   CMS Medicare.gov Compare Post Acute Care list provided to:: Patient Choice offered to / list presented to : Patient, Adult Children  Discharge Placement                       Discharge Plan and Services                          HH Arranged: PT, OT [facility] Agency: [place] Date [place]: 09/22/22   Representative spoke with at [place]: Claiborne Billings  Social Determinants of Health (SDOH) Interventions Food Insecurity Interventions: Inpatient TOC (Family and pt deny any issues obtaining food--pt has caregiver and daughter that prepare his meals)   Readmission Risk Interventions    02/05/2020    1:49 PM  Readmission Risk Prevention Plan  Transportation Screening Complete  PCP or Specialist Appt within 3-5 Days Not Complete  Not Complete comments pending medical stability  HRI or Home Care Consult Complete  Social Work Consult for [place] Planning/Counseling Complete  Palliative Care Screening Not Applicable  Medication Review Press photographer) Referral to Pharmacy

## 2022-09-22 NOTE — Progress Notes (Signed)
Physical Therapy Treatment Patient Details Name: Kent Flowers MRN: [id number] DOB: Oct 10, 1946 Today's Date: 09/22/2022   History of Present Illness Pt is a 76 y.o. male who presents to [place] ED 09/17/22 for conufsion and R facial droop, transferred to [facility]. CT of brain negative. 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 PMH: Alzheimer's dementia, COPD, CKD stage IIIa, thoracic aortic aneurysm, BPH, chronic pain, history of choledocholithiasis S/p ERCP with sphincterotomy 04/14/2021 followed by stent removal 06/24/2021.    PT Comments    Progressing towards acute rehab goals. Did not require physical assist for mobility assessed today. Still limited with ambulatory distance and RW control, but able to self correct with extra effort. Reviewed some exercises, pt agreeable to HHPT for progression of strength, endurance, balance, and gross safety with mobility. Patient will continue to benefit from skilled physical therapy services to further improve independence with functional mobility.    Recommendations for follow up therapy are one component of a multi-disciplinary discharge planning process, led by the attending physician.  Recommendations may be updated based on patient status, additional functional criteria and insurance authorization.  Follow Up Recommendations  Home health PT     Assistance Recommended at Discharge Frequent or constant Supervision/Assistance (24/7 initially for first couple of days)  Patient can return home with the following A little help with walking and/or transfers;A little help with bathing/dressing/bathroom;Assistance with cooking/housework;Direct supervision/assist for medications management;Help with stairs or ramp for entrance   Equipment Recommendations  None recommended by PT    Recommendations for Other Services       Precautions / Restrictions Precautions Precautions: Fall Precaution Comments: mild  confusion Restrictions Weight Bearing Restrictions: No     Mobility  Bed Mobility Overal bed mobility: Needs Assistance Bed Mobility: Sit to Supine     Supine to sit: Supervision     General bed mobility comments: for safety; managed foley only.    Transfers Overall transfer level: Needs assistance Equipment used: Rolling walker (2 wheels) Transfers: Sit to/from Stand Sit to Stand: Supervision           General transfer comment: Supervision for safety, performed from recliner, minimal instability with slight posterior lean before placing hands appropriately on RW for support.    Ambulation/Gait Ambulation/Gait assistance: Min guard Gait Distance (Feet): 45 Feet Assistive device: Rolling walker (2 wheels) Gait Pattern/deviations: Step-through pattern, Decreased stride length, Trunk flexed, Drifts right/left Gait velocity: Decreased Gait velocity interpretation: <1.8 ft/sec, indicate of risk for recurrent falls   General Gait Details: RW drifts towards right frequently due to RUE weakness/coordination deficits. Able to correct without physical assist. Min guard for safety. Increased distance tolerated today but seems too fatigued beyond this point. Denies SOB.   Stairs             Wheelchair Mobility    Modified Rankin (Stroke Patients Only)       Balance Overall balance assessment: Needs assistance Sitting-balance support: Feet supported, No upper extremity supported Sitting balance-Leahy Scale: Fair     Standing balance support: No upper extremity supported Standing balance-Leahy Scale: Fair                              Cognition Arousal/Alertness: Awake/alert Behavior During Therapy: Flat affect Overall Cognitive Status: No family/caregiver present to determine baseline cognitive functioning Area of Impairment: Attention, Memory, Following commands, Safety/judgement, Awareness, Problem solving  Current  Attention Level: Sustained Memory: Decreased recall of precautions, Decreased short-term memory Following Commands: Follows one step commands consistently, Follows one step commands with increased time, Follows multi-step commands inconsistently Safety/Judgement: Decreased awareness of safety, Decreased awareness of deficits Awareness: Emergent Problem Solving: Slow processing, Requires verbal cues, Difficulty sequencing          Exercises General Exercises - Lower Extremity Ankle Circles/Pumps: AROM, Both, 10 reps, Supine Quad Sets: Strengthening, Both, 10 reps, Supine Gluteal Sets: Strengthening, Both, 10 reps, Supine Straight Leg Raises: Strengthening, Both, 5 reps, Supine    General Comments General comments (skin integrity, edema, etc.): pt reports completing med mgmt on his own, reviwed with RN and CM pt does need assist with med mgmt and IADLs, recommend increased supervision at home      Pertinent Vitals/Pain Pain Assessment Pain Assessment: No/denies pain Faces Pain Scale: No hurt Pain Intervention(s): Monitored during session    Home Living                          Prior Function            PT Goals (current goals can now be found in the care plan section) Acute Rehab PT Goals Patient Stated Goal: home PT Goal Formulation: With patient Time For Goal Achievement: 10/04/22 Potential to Achieve Goals: Good Progress towards PT goals: Progressing toward goals    Frequency    Min 3X/week      PT Plan Current plan remains appropriate    Co-evaluation              AM-PAC PT "6 Clicks" Mobility   Outcome Measure  Help needed turning from your back to your side while in a flat bed without using bedrails?: A Little Help needed moving from lying on your back to sitting on the side of a flat bed without using bedrails?: A Little Help needed moving to and from a bed to a chair (including a wheelchair)?: None Help needed standing up from a chair  using your arms (e.g., wheelchair or bedside chair)?: None Help needed to walk in hospital room?: A Little Help needed climbing 3-5 steps with a railing? : A Lot 6 Click Score: 19    End of Session Equipment Utilized During Treatment: Gait belt Activity Tolerance: Patient tolerated treatment well Patient left: in bed;with bed alarm set;with call bell/phone within reach   PT Visit Diagnosis: Unsteadiness on feet (R26.81);Muscle weakness (generalized) (M62.81);Difficulty in walking, not elsewhere classified (R26.2)     Time: 2706-2376 PT Time Calculation (min) (ACUTE ONLY): 13 min  Charges:  $Gait Training: 8-22 mins                     Candie Mile, PT, DPT Physical Therapist Acute Rehabilitation Services [place]    Ellouise Newer 09/22/2022, 11:32 AM

## 2022-09-22 NOTE — Discharge Summary (Signed)
Physician Discharge Summary  Kent Flowers SAY:[id number] DOB: [birth year] DOA: 09/17/2022  PCP: Carylon Perches, NP  Admit date: 09/17/2022 Discharge date: 09/22/2022  Admitted From: Home Disposition: Home  Recommendations for Outpatient Follow-up:  Follow up with PCP in 1 week with repeat CBC/CMP Outpatient follow-up with gastroenterology at [place] follow-up with general surgery if patient agreeable for gallbladder surgery Follow up in ED if symptoms worsen or new appear   Home Health: Home health PT  equipment/Devices: None  Discharge Condition: Guarded  CODE STATUS: Partial: No CPR but okay for intubation Diet recommendation: Heart healthy  Brief/Interim Summary: 76 y.o. male with medical history significant for Alzheimer's dementia, COPD, CKD stage IIIa, thoracic aortic aneurysm, BPH, chronic pain, history of choledocholithiasis S/p ERCP with sphincterotomy 04/14/2021 followed by stent removal 06/24/2021 presented for evaluation of confusion and right facial droop which subsequently resolved in the ED.  On presentation, he had leukocytosis with elevated LFTs.  CT of the head was negative for acute intracranial abnormality.  Chest x-ray was negative for focal consolidation, edema or effusion.  CT of abdomen/pelvis showed dilated CBD with likely choledocholithiasis given debris noted within this distal CBD; mild bilateral hydroureter with fullness of the left renal pelvis; indeterminate aortocaval 3.8 x 2.1 cm density at the level of aortic bifurcation.  Patient was started on IV fluids and antibiotics.  GI and general surgery were consulted.  During the hospitalization, he underwent ERCP with removal of CBD stone and sludge.  LFTs have improved.  He is tolerating diet.  Patient/family refused surgical intervention at this time.  GI/general surgery have cleared him for discharge.  He has been started on ursodiol by GI which will be continued.  Discharge patient home today with  outpatient follow-up with PCP/GI at [facility] and general surgery if needed    Discharge Diagnoses:  Choledocholithiasis/elevated LFTs -Patient has history of recurrent choledocholithiasis requiring ERCP with sphincterotomy with stent placement and subsequent removal -Imaging on presentation as above. -Currently on Rocephin and Flagyl.  GI and general surgery following.  Status post ERCP with removal of CBD stone and sludge -LFTs improved. - He is tolerating diet.  Patient/family refused surgical intervention at this time.  GI/general surgery have cleared him for discharge.  He has been started on ursodiol by GI which will be continued.  Discharge patient home today with outpatient follow-up with PCP/GI at [facility] and general surgery if needed   Leukocytosis -Resolved.     AKI on CKD stage IIIa -Creatinine 1.7 on admission compared to prior 1.3.  Slightly improving to 1.67 today.  Outpatient follow-up.   Acute metabolic encephalopathy History of mild dementia/memory impairment -Presented with some confusion on background of mild dementia.  MRI of brain was negative for acute stroke. -Continue donepezil and Seroquel.  Outpatient follow-up with neurology -Mental status currently stable.   COPD -Stable.  Continue current inhaled regimen   Chronic low back pain -On narcotics chronically for many years.  Continue current pain regimen.   Physical deconditioning -PT recommends home health PT   Discharge Instructions  Discharge Instructions     Diet - low sodium heart healthy   Complete by: As directed    Increase activity slowly   Complete by: As directed       Allergies as of 09/22/2022       Reactions   Ceclor [cefaclor] Rash   Sulfa Antibiotics Rash, Other (See Comments)   SEVERE RASH- childhood allergy  Medication List     TAKE these medications    albuterol 108 (90 Base) MCG/ACT inhaler Commonly known as: ProAir HFA Inhale 2 puffs into the  lungs every 6 (six) hours as needed for shortness of breath or wheezing.   Breztri Aerosphere 160-9-4.8 MCG/ACT Aero Generic drug: Budeson-Glycopyrrol-Formoterol Inhale 2 puffs into the lungs in the morning and at bedtime.   donepezil 10 MG tablet Commonly known as: ARICEPT Take 1 tablet (10 mg total) by mouth at bedtime.   oxyCODONE 15 MG immediate release tablet Commonly known as: ROXICODONE Take 1 tablet (15 mg total) by mouth 5 (five) times daily as needed for pain.   QUEtiapine 25 MG tablet Commonly known as: SEROQUEL TAKE 1 TABLET BY MOUTH EVERYDAY AT BEDTIME What changed: See the new instructions.   tamsulosin 0.4 MG Caps capsule Commonly known as: FLOMAX Take 0.4 mg by mouth at bedtime.   testosterone cypionate 200 MG/ML injection Commonly known as: DEPOTESTOSTERONE CYPIONATE Inject 180 mg into the muscle every 14 (fourteen) days.   ursodiol 300 MG capsule Commonly known as: ACTIGALL Take 1 capsule (300 mg total) by mouth 2 (two) times daily.        Follow-up Information     Carylon Perches, NP. Schedule an appointment as soon as possible for a visit in 1 week(s).   Specialty: Family Medicine Why: with repeat CBC/CMP Contact information: [address] [place] [address] [phone]         Gastroenterologist. Schedule an appointment as soon as possible for a visit in 1 week(s).                 Allergies  Allergen Reactions   Ceclor [Cefaclor] Rash   Sulfa Antibiotics Rash and Other (See Comments)    SEVERE RASH- childhood allergy    Consultations: GI/general surgery   Procedures/Studies: DG ERCP  Result Date: 09/20/2022 CLINICAL DATA:  Choledocholithiasis EXAM: ERCP TECHNIQUE: Multiple spot images obtained with the fluoroscopic device and submitted for interpretation post-procedure. FLUOROSCOPY: Radiation Exposure Index (as provided by the fluoroscopic device): 199.73 mGy Kerma COMPARISON:  MRCP 09/19/2022 FINDINGS: A total of 16  intraoperative saved images are submitted for review. The images demonstrate a flexible duodenal scope in the descending duodenum with wire cannulation of the common duct. Cholangiography demonstrates biliary ductal dilatation with filling defects consistent with choledocholithiasis. Subsequent images confirm sphincterotomy and balloon sweeping of the common duct. Pneumobilia is present on the final image. This is not unexpected. IMPRESSION: 1. Choledocholithiasis. 2. ERCP with sphincterotomy and balloon sweeping of the common duct. These images were submitted for radiologic interpretation only. Please see the procedural report for the amount of contrast and the fluoroscopy time utilized. Electronically Signed   By: Jacqulynn Cadet M.D.   On: 09/20/2022 16:36   MR ABDOMEN MRCP WO CONTRAST  Result Date: 09/19/2022 CLINICAL DATA:  Cholelithiasis EXAM: MRI ABDOMEN WITHOUT CONTRAST  (INCLUDING MRCP) TECHNIQUE: Multiplanar multisequence MR imaging of the abdomen was performed. Heavily T2-weighted images of the biliary and pancreatic ducts were obtained, and three-dimensional MRCP images were rendered by post processing. COMPARISON:  CT abdomen pelvis dated September 17, 2022 FINDINGS: Lower chest: Right greater than left bibasilar atelectasis. Hepatobiliary: No suspicious focal liver lesions. Moderately dilated intrahepatic bile ducts and common bile duct, common bile duct measures up to 15 mm. Blunted affect of the distal common bile duct measuring 1.9 x 0.8 cm on series 5, image 17. Pancreas: No mass, inflammatory changes, or other parenchymal abnormality identified. Spleen: Scattered T2  hyperintense splenic lesions which are likely simple cysts. Normal size of the spleen. Adrenals/Urinary Tract: Bilateral adrenal glands are unremarkable. Moderate left hydronephrosis and hydroureter, slightly increased hydronephrosis when compared with prior CT. No obstructing stone visualized. Stomach/Bowel: Visualized portions  within the abdomen are unremarkable. Vascular/Lymphatic: No pathologically enlarged lymph nodes identified. Atherosclerotic disease. No abdominal aortic aneurysm demonstrated. Other: T2 hyperintense lesion located adjacent to the inferior vena cava measuring 3.0 x 2.0 cm on series 4, image 5, unchanged when compared with prior. Musculoskeletal: No suspicious bone lesions identified. IMPRESSION: 1. Moderate biliary ductal dilation with filling defect of the distal common bile duct measuring 1.9 x 0.8 cm, compatible with choledocholithiasis. 2. Moderate left hydronephrosis and hydroureter, slightly increased hydronephrosis when compared with prior CT. No obstructing stone visualized. Electronically Signed   By: Yetta Glassman M.D.   On: 09/19/2022 09:58   MR 3D Recon At Scanner  Result Date: 09/19/2022 CLINICAL DATA:  Cholelithiasis EXAM: MRI ABDOMEN WITHOUT CONTRAST  (INCLUDING MRCP) TECHNIQUE: Multiplanar multisequence MR imaging of the abdomen was performed. Heavily T2-weighted images of the biliary and pancreatic ducts were obtained, and three-dimensional MRCP images were rendered by post processing. COMPARISON:  CT abdomen pelvis dated September 17, 2022 FINDINGS: Lower chest: Right greater than left bibasilar atelectasis. Hepatobiliary: No suspicious focal liver lesions. Moderately dilated intrahepatic bile ducts and common bile duct, common bile duct measures up to 15 mm. Blunted affect of the distal common bile duct measuring 1.9 x 0.8 cm on series 5, image 17. Pancreas: No mass, inflammatory changes, or other parenchymal abnormality identified. Spleen: Scattered T2 hyperintense splenic lesions which are likely simple cysts. Normal size of the spleen. Adrenals/Urinary Tract: Bilateral adrenal glands are unremarkable. Moderate left hydronephrosis and hydroureter, slightly increased hydronephrosis when compared with prior CT. No obstructing stone visualized. Stomach/Bowel: Visualized portions within the  abdomen are unremarkable. Vascular/Lymphatic: No pathologically enlarged lymph nodes identified. Atherosclerotic disease. No abdominal aortic aneurysm demonstrated. Other: T2 hyperintense lesion located adjacent to the inferior vena cava measuring 3.0 x 2.0 cm on series 4, image 5, unchanged when compared with prior. Musculoskeletal: No suspicious bone lesions identified. IMPRESSION: 1. Moderate biliary ductal dilation with filling defect of the distal common bile duct measuring 1.9 x 0.8 cm, compatible with choledocholithiasis. 2. Moderate left hydronephrosis and hydroureter, slightly increased hydronephrosis when compared with prior CT. No obstructing stone visualized. Electronically Signed   By: Yetta Glassman M.D.   On: 09/19/2022 09:58   MR BRAIN WO CONTRAST  Result Date: 09/19/2022 CLINICAL DATA:  Neuro deficit, acute, stroke suspected. EXAM: MRI HEAD WITHOUT CONTRAST TECHNIQUE: Multiplanar, multiecho pulse sequences of the brain and surrounding structures were obtained without intravenous contrast. COMPARISON:  Head CT September 17, 2022 FINDINGS: Brain: No acute infarction, hemorrhage, hydrocephalus, extra-axial collection or mass lesion. Scattered and confluent foci of T2 hyperintensity are seen within the white matter of the cerebral hemispheres, nonspecific, most likely related to chronic small vessel ischemia. Moderate parenchymal volume loss. Focus of susceptibility artifact in the right frontal lobe suggesting small hemosiderin deposit. Vascular: Normal flow voids. Skull and upper cervical spine: Type 2 odontoid fracture with mild posterior displacement of the superior fragment, similar to CT performed in February 2023. Sinuses/Orbits: Negative. Other: None. IMPRESSION: 1. No acute intracranial abnormality. 2. Moderate chronic microvascular ischemic changes of the white matter. 3. Moderate parenchymal volume loss. 4. Type 2 odontoid fracture with mild posterior displacement of the superior fragment,  similar to CT performed in February 2023. Electronically Signed   By: Erven Colla  de Sindy Messing M.D.   On: 09/19/2022 09:14   CT ABDOMEN PELVIS W CONTRAST  Result Date: 09/17/2022 CLINICAL DATA:  Sepsis Abdominal pain, acute, nonlocalized EXAM: CT ABDOMEN AND PELVIS WITH CONTRAST TECHNIQUE: Multidetector CT imaging of the abdomen and pelvis was performed using the standard protocol following bolus administration of intravenous contrast. RADIATION DOSE REDUCTION: This exam was performed according to the departmental dose-optimization program which includes automated exposure control, adjustment of the mA and/or kV according to patient size and/or use of iterative reconstruction technique. CONTRAST:  140m OMNIPAQUE IOHEXOL 300 MG/ML  SOLN COMPARISON:  CT abdomen pelvis 02/13/2017, PET CT 02/11/2020 FINDINGS: Lower chest: No acute abnormality.  Coronary artery calcification. Hepatobiliary: No focal liver abnormality. No gallstones, gallbladder wall thickening, or pericholecystic fluid. The common bile duct is dilated measuring up to 1.7 cm. Debris is noted within the distal common bile duct (5:56). Pancreas: No focal lesion. Normal pancreatic contour. No surrounding inflammatory changes. No main pancreatic ductal dilatation. Spleen: Normal in size. Punctate calcifications. Subcentimeter hypodensity too small to characterize. There is an indeterminate 1.6 cm in hypodensity. Adrenals/Urinary Tract: No adrenal nodule bilaterally. Bilateral kidneys enhance symmetrically. No hydronephrosis. Mild bilateral hydroureter with fullness of the left renal pelvis. No nephroureterolithiasis. The urinary bladder is unremarkable. Stomach/Bowel: Stomach is within normal limits. No evidence of bowel wall thickening or dilatation. Few scattered colonic diverticula. Appendix appears normal. Vascular/Lymphatic: No abdominal aorta or iliac aneurysm. Severe calcified and noncalcified atherosclerotic plaque of the aorta and its  branches. There is a aortocaval 3.8 x 2.1 cm density at the level of the aortic bifurcation. No abdominal, pelvic, or inguinal lymphadenopathy. Reproductive: Prostate is unremarkable. Other: No intraperitoneal free fluid. No intraperitoneal free gas. No organized fluid collection. Musculoskeletal: No abdominal wall hernia or abnormality. No suspicious lytic or blastic osseous lesions. No acute displaced fracture. Multilevel degenerative changes of the spine. Old healed right femoral fracture with intramedullary nail fixation partially visualized. IMPRESSION: 1. Dilated common bile duct with likely choledocholithiasis given debris noted within the distal common bile duct. Differential diagnosis includes intraluminal mass. Recommend MRCP further evaluation. 2. Mild bilateral hydroureter with fullness of the left renal pelvis. This may reflect the residual of a recently passed calculus or reflect chronic changes of obstructive uropathy or reflux. 3. Indeterminate aortocaval 3.8 x 2.1 cm density at the level of the aortic bifurcation. Recommend attention on follow-up in the setting of prior malignancy. 4. Colonic diverticulosis with no acute diverticulitis. 5.  Aortic Atherosclerosis (ICD10-I70.0). Electronically Signed   By: MIven FinnM.D.   On: 09/17/2022 16:35   DG Chest 2 View  Result Date: 09/17/2022 CLINICAL DATA:  Cough EXAM: CHEST - 2 VIEW COMPARISON:  None Available. FINDINGS: Normal cardiac silhouette. Low lung volumes. Chronic bronchitic markings. Degenerative osteophytosis of the spine. IMPRESSION: 1. No significant change from prior. 2. Chronic bronchitic markings. No clear acute cardiopulmonary findings. Electronically Signed   By: SSuzy BouchardM.D.   On: 09/17/2022 16:11   CT HEAD WO CONTRAST  Result Date: 09/17/2022 CLINICAL DATA:  Confusion.  Right facial droop today. EXAM: CT HEAD WITHOUT CONTRAST TECHNIQUE: Contiguous axial images were obtained from the base of the skull through the  vertex without intravenous contrast. RADIATION DOSE REDUCTION: This exam was performed according to the departmental dose-optimization program which includes automated exposure control, adjustment of the mA and/or kV according to patient size and/or use of iterative reconstruction technique. COMPARISON:  01/18/2022. FINDINGS: Brain: No evidence of acute infarction, hemorrhage, hydrocephalus, extra-axial collection or  mass lesion/mass effect. Mild patchy periventricular white matter hypoattenuation consistent with chronic microvascular ischemic change. Stable. Vascular: No hyperdense vessel or unexpected calcification. Skull: Normal. Negative for fracture or focal lesion. Sinuses/Orbits: Globes and orbits are unremarkable. Mild scattered mucosal thickening, inferior frontal and bilateral ethmoid sinuses, minimally in the maxillary sinuses. Other: None. IMPRESSION: 1. No acute intracranial abnormalities. Electronically Signed   By: Lajean Manes M.D.   On: 09/17/2022 13:01      Subjective: Patient seen and examined at bedside.  Awake, poor historian, slightly confused to time.  Wants to go home today.  Denies worsening fever, vomiting or abdominal pain.  Discharge Exam: Vitals:   09/22/22 0444 09/22/22 0835  BP: 114/67 (!) 157/89  Pulse: (!) 59 62  Resp: 16 17  Temp: 97.7 F (36.5 C) 97.6 F (36.4 C)  SpO2: 92% 93%    General: Pt is alert, awake, not in acute distress.  Chronically ill and deconditioned.  Awake, slow to respond and slightly confused.  On room air. Cardiovascular: Mild intermittent bradycardia noted; S1/S2 + Respiratory: bilateral decreased breath sounds at bases Abdominal: Soft, NT, ND, bowel sounds + Extremities: Trace lower extremity edema; no cyanosis    The results of significant diagnostics from this hospitalization (including imaging, microbiology, ancillary and laboratory) are listed below for reference.     Microbiology: Recent Results (from the past 240 hour(s))   Urine Culture     Status: None   Collection Time: 09/17/22  4:07 PM   Specimen: Urine, Clean Catch  Result Value Ref Range Status   Specimen Description   Final    URINE, CLEAN CATCH Performed at [facility], [address] [place], [place] [address]    Special Requests   Final    NONE Performed at [facility], [address], [place], [place] [address]    Culture   Final    NO GROWTH Performed at [facility], [place] [address], [place], [place] [address]    Report Status 09/18/2022 FINAL  Final  Culture, blood (routine x 2)     Status: None   Collection Time: 09/17/22  5:38 PM   Specimen: BLOOD RIGHT ARM  Result Value Ref Range Status   Specimen Description BLOOD RIGHT ARM  Final   Special Requests   Final    BOTTLES DRAWN AEROBIC AND ANAEROBIC Blood Culture adequate volume   Culture   Final    NO GROWTH 5 DAYS Performed at [facility], [place] [address], [place], [place] [address]    Report Status 09/22/2022 FINAL  Final     Labs: BNP (last 3 results) Recent Labs    01/19/22 0417  BNP 40.9   Basic Metabolic Panel: Recent Labs  Lab 09/17/22 1225 09/17/22 1424 09/19/22 0432 09/20/22 0614 09/21/22 0456 09/22/22 0441  NA 137 137 139 139 137 140  K 4.4 4.1 4.1 4.2 3.6 3.7  CL 101  --  104 103 105 105  CO2 27  --  '24 23 24 24  '$ GLUCOSE 116*  --  96 97 109* 119*  BUN 27*  --  23 27* 36* 34*  CREATININE 1.70*  --  1.66* 1.66* 1.56* 1.67*  CALCIUM 10.0  --  8.5* 8.9 8.4* 8.9  MG  --   --   --   --  2.0 2.0   Liver Function Tests: Recent Labs  Lab 09/17/22 1225 09/19/22 0432 09/20/22 0614 09/21/22 0456 09/22/22 0441  AST 64* 26  20 36 28  ALT 66* 35 25 38 32  ALKPHOS 196* 125 111 178* 153*  BILITOT 1.7* 0.8 0.5 0.6 0.2*  PROT 7.8 6.2* 6.5 6.1* 6.2*  ALBUMIN 4.5 3.2* 3.3* 3.0* 3.1*   No results for input(s): "LIPASE", "AMYLASE" in the last 168 hours. Recent Labs  Lab 09/17/22 1418  AMMONIA 34    CBC: Recent Labs  Lab 09/17/22 1225 09/17/22 1424 09/19/22 0432 09/21/22 0456 09/22/22 0441  WBC 16.7*  --  10.4 9.3 8.3  NEUTROABS 13.6*  --   --  6.0 5.2  HGB 16.2 16.0 15.6 14.4 14.4  HCT 49.5 47.0 46.6 44.3 44.5  MCV 92.2  --  92.3 92.1 93.1  PLT 308  --  218 271 260   Cardiac Enzymes: No results for input(s): "CKTOTAL", "CKMB", "CKMBINDEX", "TROPONINI" in the last 168 hours. BNP: Invalid input(s): "POCBNP" CBG: Recent Labs  Lab 09/17/22 1219 09/18/22 1445  GLUCAP 117* 103*   D-Dimer No results for input(s): "DDIMER" in the last 72 hours. Hgb A1c No results for input(s): "HGBA1C" in the last 72 hours. Lipid Profile No results for input(s): "CHOL", "HDL", "LDLCALC", "TRIG", "CHOLHDL", "LDLDIRECT" in the last 72 hours. Thyroid function studies No results for input(s): "TSH", "T4TOTAL", "T3FREE", "THYROIDAB" in the last 72 hours.  Invalid input(s): "FREET3" Anemia work up No results for input(s): "VITAMINB12", "FOLATE", "FERRITIN", "TIBC", "IRON", "RETICCTPCT" in the last 72 hours. Urinalysis    Component Value Date/Time   COLORURINE YELLOW 09/17/2022 1520   APPEARANCEUR HAZY (A) 09/17/2022 1520   LABSPEC 1.014 09/17/2022 1520   PHURINE 6.5 09/17/2022 1520   GLUCOSEU NEGATIVE 09/17/2022 1520   HGBUR MODERATE (A) 09/17/2022 1520   BILIRUBINUR NEGATIVE 09/17/2022 1520   KETONESUR NEGATIVE 09/17/2022 1520   PROTEINUR 30 (A) 09/17/2022 1520   NITRITE NEGATIVE 09/17/2022 1520   LEUKOCYTESUR LARGE (A) 09/17/2022 1520   Sepsis Labs Recent Labs  Lab 09/17/22 1225 09/19/22 0432 09/21/22 0456 09/22/22 0441  WBC 16.7* 10.4 9.3 8.3   Microbiology Recent Results (from the past 240 hour(s))  Urine Culture     Status: None   Collection Time: 09/17/22  4:07 PM   Specimen: Urine, Clean Catch  Result Value Ref Range Status   Specimen Description   Final    URINE, CLEAN CATCH Performed at [facility], [address], [place], [place]  [address]    Special Requests   Final    NONE Performed at [facility], [address], [place], [place] [address]    Culture   Final    NO GROWTH Performed at [facility], [place] [address], [place], [place] [address]    Report Status 09/18/2022 FINAL  Final  Culture, blood (routine x 2)     Status: None   Collection Time: 09/17/22  5:38 PM   Specimen: BLOOD RIGHT ARM  Result Value Ref Range Status   Specimen Description BLOOD RIGHT ARM  Final   Special Requests   Final    BOTTLES DRAWN AEROBIC AND ANAEROBIC Blood Culture adequate volume   Culture   Final    NO GROWTH 5 DAYS Performed at [facility], [place] [address] [place],  [address]    Report Status 09/22/2022 FINAL  Final     Time coordinating discharge: 35 minutes  SIGNED:   Aline August, MD  [facility] 09/22/2022, 9:17 AM

## 2022-09-24 ENCOUNTER — Encounter (HOSPITAL_COMMUNITY): Payer: Self-pay | Admitting: Gastroenterology

## 2022-09-27 NOTE — Progress Notes (Deleted)
HPI male former smoker followed for COPD, history lung nodules, complicated by osteoarthritis, ascending aortic aneurysm Spirometry 01/24/12- FVC 2.24/ 64%, FEV1 1.20/ 42%, FEV1/FVC 0.53 PFT: 03/19/2012-moderate obstructive airways disease, insignificant response to bronchodilator , air trapping,normal diffusion capacity. Loop contour suggests emphysema. FVC 3.73/91%, FEV1 2.07/73%, FEV1/FVC 0.56. TLC 114%, RV 138% , DLCO 90% CT chest 10/01/13- Visualized small lung nodules are benign based on current consensus criteria  Office Spirometry 05/03/17-moderately severe obstruction. FVC 3.25/79%, FEV1 1.77/58%, ratio 0.54, FEF 25-75% 0.88/38%  -------------------------------------------------------------------------------------------------------------   09/27/21- 76 year old male former smoker followed for COPD, history lung nodules , R apical Lung Mass (resolved), T-Spine Epidural Abscess MSSA, BPH, Aortic Aneurysm, CAD, Hepatitis,  -Proair hfa,  Covid vax-3 Moderna Flu vax-today Recent cholecystectomy No Covid infection. R arm paresis following Spine surgery for abscess. No major respiratory events. Uses rescue inhaler 3x/ day- in anticipation. Rarely hears himself wheezing, but hearing poor. Doesn't often push to DOE.  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   09/28/22- 76 year old male former smoker followed for COPD, history lung nodules , R apical Lung Mass (resolved),  complicated by Alzheimers, T-Spine Epidural Abscess MSSA, BPH, Aortic Aneurysm, CAD, CKD3,  Hepatitis,  -Proair hfa, Breztri,  Covid vax-3 Moderna Flu vax-today Hosp 76/2-83/1- Acute metabolic encephalopathy, gall stone/ ERCP,   (Ok intubation but no CPR)   ROS-see HPI      + = positive Constitutional:   No-   weight loss, night sweats,  chills, fatigue, lassitude. HEENT:   No-  headaches, difficulty swallowing, tooth/dental problems,        No-  sneezing, itching, ear ache,  +nasal congestion, post nasal drip,   CV:  No- chest pain, orthopnea, PND, swelling in lower extremities, anasarca,  dizziness, palpitations Resp: +   shortness of breath with exertion or at rest.              +productive cough, no- non-productive cough,  No- coughing up of blood.             No change in color of mucus.  + wheezing.   Skin: No-   rash or lesions. GI:  No-   heartburn, indigestion, abdominal pain, nausea, vomiting GU: No-   dysuria,. MS:  +  joint pain or swelling.     Neuro-     nothing unusual Psych:  No- change in mood or affect. No depression or anxiety.  No memory loss.  OBJ- Physical Exam General- Alert, Oriented, Affect-appropriate, Distress- none acute, + slender Skin- rash-none, lesions- none, excoriation- none Lymphadenopathy- none Head- atraumatic            Eyes- Gross vision intact, PERRLA, conjunctivae and secretions clear            Ears- Hearing,             Nose- Clear, no-Septal dev, mucus, polyps, erosion, perforation             Throat- Mallampati II , mucosa clear , drainage- none, tonsils- atrophic Neck- flexible , trachea midline, no stridor , thyroid nl, carotid no bruit Chest - symmetrical excursion , unlabored           Heart/CV- RRR , no murmur , no gallop  , no rub, nl s1 s2                           - JVD- none , edema- none, stasis changes- none, varices- none  Lung- + diminished, wheeze+bilateral, cough- none , dullness-none, rub- none           Chest wall-  Abd-  Br/ Gen/ Rectal- Not done, not indicated Extrem- cyanosis- none, clubbing, none, atrophy- none, strength- nl Neuro- + paresis R arm

## 2022-09-28 ENCOUNTER — Ambulatory Visit: Payer: Medicare Other | Admitting: Internal Medicine

## 2022-10-05 ENCOUNTER — Other Ambulatory Visit: Payer: Self-pay | Admitting: Neurology

## 2022-10-06 ENCOUNTER — Other Ambulatory Visit: Payer: Self-pay | Admitting: Internal Medicine

## 2022-10-06 DIAGNOSIS — J45901 Unspecified asthma with (acute) exacerbation: Secondary | ICD-10-CM

## 2022-10-09 NOTE — Telephone Encounter (Signed)
Albuterol inhaler refilled 

## 2022-10-19 ENCOUNTER — Telehealth: Payer: Self-pay | Admitting: Neurology

## 2022-10-19 NOTE — Telephone Encounter (Signed)
Daughter states that she would like pt seen earlier just due to all issues pt having are increasing.  Phone rep informed her the request and reason would be sent RN's for review

## 2022-10-19 NOTE — Telephone Encounter (Signed)
I reviewed the MRI Brain, it is not consistent with vascular dementia. Please advise daughter to increase the Seroquel to 25 mg twice daily. Yes, please add him to my schedule. You can use a new patient slot. Thank you
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
## 2022-10-23 NOTE — Telephone Encounter (Signed)
Called daughter, left detailed VM with Dr Jabier Mutton Rx increase, advised she call and schedule FU with Dr April Manson, (may use new pt slot per MD).  Repeated message and #.

## 2022-10-25 NOTE — Progress Notes (Signed)
HPI male former smoker followed for COPD, history lung nodules, complicated by osteoarthritis, ascending aortic aneurysm Spirometry 01/24/12- FVC 2.24/ 64%, FEV1 1.20/ 42%, FEV1/FVC 0.53 PFT: 03/19/2012-moderate obstructive airways disease, insignificant response to bronchodilator , air trapping,normal diffusion capacity. Loop contour suggests emphysema. FVC 3.73/91%, FEV1 2.07/73%, FEV1/FVC 0.56. TLC 114%, RV 138% , DLCO 90% CT chest 10/01/13- Visualized small lung nodules are benign based on current consensus criteria  Office Spirometry 05/03/17-moderately severe obstruction. FVC 3.25/79%, FEV1 1.77/58%, ratio 0.54, FEF 25-75% 0.88/38%  -------------------------------------------------------------------------------------------------------------     09/27/21- 76 year old male former smoker followed for COPD, history lung nodules , R apical Lung Mass (resolved), T-Spine Epidural Abscess MSSA, BPH, Aortic Aneurysm, CAD, Hepatitis,  -Proair hfa,  Covid vax-3 Moderna Flu vax-today Recent cholecystectomy No Covid infection. R arm paresis following Spine surgery for abscess. No major respiratory events. Uses rescue inhaler 3x/ day- in anticipation. Rarely hears himself wheezing, but hearing poor. Doesn't often push to DOE.  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   10/26/22- - 76 year old male former smoker followed for COPD, history lung nodules , R apical Lung Mass (resolved), T-Spine Epidural Abscess MSSA, BPH, Aortic Aneurysm, CAD, Hepatitis, Alzheimers, CKD3,   -Proair hfa, Breztri, Covid vax-3 [facility]                        Son here Flu vax-today senior Recent ERCP SOB has not improved  He feels his breathing has been stable without major changes through the summer.  Cough tends to produce gray sputum with no blood or purulent discharge.  He needs inhalers refilled.  Son raised question of oxygenation.  Arrival room air saturation today 95% but we will check overnight oximetry.   Recent chest x-ray reviewed with no progressive process. Has spasticity in right hand which he attributes to complication of surgery, "not a stroke" CXR 09/17/22- MPRESSION: 1. No significant change from prior. 2. Chronic bronchitic markings. No clear acute cardiopulmonary findings.   ROS-see HPI      + = positive Constitutional:   No-   weight loss, night sweats,  chills, fatigue, lassitude. HEENT:   No-  headaches, difficulty swallowing, tooth/dental problems,        No-  sneezing, itching, ear ache,  +nasal congestion, post nasal drip,  CV:  No- chest pain, orthopnea, PND, swelling in lower extremities, anasarca,  dizziness, palpitations Resp: +   shortness of breath with exertion or at rest.              +productive cough, no- non-productive cough,  No- coughing up of blood.             No change in color of mucus.  + wheezing.   Skin: No-   rash or lesions. GI:  No-   heartburn, indigestion, abdominal pain, nausea, vomiting GU: No-   dysuria,. MS:  +  joint pain or swelling.     Neuro-     nothing unusual Psych:  No- change in mood or affect. No depression or anxiety.  No memory loss.  OBJ- Physical Exam General- Alert, Oriented, Affect-appropriate, Distress- none acute, + slender Skin- rash-none, lesions- none, excoriation- none Lymphadenopathy- none Head- atraumatic            Eyes- Gross vision intact, PERRLA, conjunctivae and secretions clear            Ears- Hearing,             Nose- Clear, no-Septal dev,  mucus, polyps, erosion, perforation             Throat- Mallampati II , mucosa clear , drainage- none, tonsils- atrophic Neck- flexible , trachea midline, no stridor , thyroid nl, carotid no bruit Chest - symmetrical excursion , unlabored           Heart/CV- RRR , no murmur , no gallop  , no rub, nl s1 s2                           - JVD- none , edema- none, stasis changes- none, varices- none           Lung- + diminished, wheeze+bilateral, cough- none , dullness-none,  rub- none           Chest wall-  Abd-  Br/ Gen/ Rectal- Not done, not indicated Extrem- cyanosis- none, clubbing, none, atrophy- none, strength- nl Neuro- + paresis R arm

## 2022-10-26 ENCOUNTER — Encounter: Payer: Self-pay | Admitting: Internal Medicine

## 2022-10-26 ENCOUNTER — Ambulatory Visit (INDEPENDENT_AMBULATORY_CARE_PROVIDER_SITE_OTHER): Payer: Medicare Other | Admitting: Internal Medicine

## 2022-10-26 VITALS — BP 122/70 | HR 85 | Ht 67.0 in | Wt 164.4 lb

## 2022-10-26 DIAGNOSIS — R911 Solitary pulmonary nodule: Secondary | ICD-10-CM | POA: Diagnosis not present

## 2022-10-26 DIAGNOSIS — J449 Chronic obstructive pulmonary disease, unspecified: Secondary | ICD-10-CM

## 2022-10-26 DIAGNOSIS — J441 Chronic obstructive pulmonary disease with (acute) exacerbation: Secondary | ICD-10-CM

## 2022-10-26 DIAGNOSIS — J45901 Unspecified asthma with (acute) exacerbation: Secondary | ICD-10-CM | POA: Diagnosis not present

## 2022-10-26 MED ORDER — BREZTRI AEROSPHERE 160-9-4.8 MCG/ACT IN AERO: 2.0000 | INHALATION_SPRAY | Freq: Two times a day (BID) | RESPIRATORY_TRACT | 12 refills | Status: DC

## 2022-10-26 MED ORDER — ALBUTEROL SULFATE HFA 108 (90 BASE) MCG/ACT IN AERS: 2.0000 | INHALATION_SPRAY | Freq: Four times a day (QID) | RESPIRATORY_TRACT | 12 refills | Status: DC | PRN

## 2022-10-26 NOTE — Patient Instructions (Signed)
Order- schedule overnight oximetry on room air   dx COPD mixed type  Refills sent for albuterol and Breztri inhalers

## 2022-10-27 ENCOUNTER — Encounter: Payer: Self-pay | Admitting: Internal Medicine

## 2022-10-27 NOTE — Assessment & Plan Note (Signed)
Clinically stable Plan-refill albuterol and Breztri.  Schedule overnight oximetry

## 2022-10-27 NOTE — Assessment & Plan Note (Signed)
Current CXR indicates stable chronic bronchitic markings.

## 2022-11-01 ENCOUNTER — Ambulatory Visit (INDEPENDENT_AMBULATORY_CARE_PROVIDER_SITE_OTHER): Payer: Medicare Other | Admitting: Neurology

## 2022-11-01 ENCOUNTER — Encounter: Payer: Self-pay | Admitting: Neurology

## 2022-11-01 VITALS — BP 123/75 | HR 87 | Ht 67.0 in | Wt 164.0 lb

## 2022-11-01 DIAGNOSIS — F02A18 Dementia in other diseases classified elsewhere, mild, with other behavioral disturbance: Secondary | ICD-10-CM

## 2022-11-01 DIAGNOSIS — G301 Alzheimer's disease with late onset: Secondary | ICD-10-CM

## 2022-11-01 MED ORDER — VITAMIN B-12 1000 MCG PO TABS: 1000.0000 ug | ORAL_TABLET | Freq: Every day | ORAL | 3 refills | Status: AC

## 2022-11-01 MED ORDER — ASPIRIN 81 MG PO TBEC: 81.0000 mg | DELAYED_RELEASE_TABLET | Freq: Every day | ORAL | 4 refills | Status: DC

## 2022-11-01 MED ORDER — MEMANTINE HCL 10 MG PO TABS: 10.0000 mg | ORAL_TABLET | Freq: Two times a day (BID) | ORAL | 3 refills | Status: DC

## 2022-11-01 MED ORDER — QUETIAPINE FUMARATE 25 MG PO TABS: 25.0000 mg | ORAL_TABLET | Freq: Two times a day (BID) | ORAL | 6 refills | Status: AC
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
## 2022-11-01 NOTE — Patient Instructions (Signed)
Continue with Aricept 10 mg nightly Start Namenda 10 mg twice daily Increase Seroquel to 25 mg twice daily Start with aspirin 81 mg daily Continue your other medications

## 2022-11-03 ENCOUNTER — Ambulatory Visit: Payer: Medicare Other | Admitting: Neurology

## 2022-12-26 ENCOUNTER — Other Ambulatory Visit: Payer: Self-pay | Admitting: Neurology

## 2022-12-26 ENCOUNTER — Ambulatory Visit: Payer: Medicare Other | Admitting: Neurology

## 2023-01-01 ENCOUNTER — Telehealth: Payer: Self-pay | Admitting: Neurology

## 2023-01-01 NOTE — Telephone Encounter (Signed)
Pt daughter is calling. Stated pt fell today in the kitchen and he don't remember if he hit his head on something or not. Stated he is acting really strange and can't answer simple question.Pt is telling daughter he is confused. Stated there wasn't any marks to indicate that pt hit his head. She is requesting a call back from nurse to discuss

## 2023-01-01 NOTE — Telephone Encounter (Signed)
I spoke with the patient's daughter, Kent Flowers. She stated that the patient fell earlier this morning. She was unsure if he hit his head during the incident, but did not see any signs of trauma. He was moved to a chair shortly after the fall and has not moved from his position since.  EMS came earlier in the day to evaluate the patient and did not transport him to the hospital. She states the patient appeared to "put on a front" for EMS. He continues to appear confused and disoriented after EMS left this morning. The patient's daughter states he did not want to go to the hospital.  I advised that he be examined in the ER due to continuing confusion, lack of ambulation. Kent Flowers expressed concerns about her father's willingness to go to the hospital. I advised her to have the patient assessed at urgent care if he refused to go to the ER. I explained the importance of timely care and assessment by a physician. She verbalized understanding and expressed appreciation for the call. She will provide [place] with updates on the patient's condition.

## 2023-01-08 ENCOUNTER — Ambulatory Visit: Payer: Medicare Other | Admitting: Podiatry

## 2023-01-18 ENCOUNTER — Telehealth: Payer: Self-pay | Admitting: Neurology

## 2023-01-18 NOTE — Telephone Encounter (Signed)
Pt's daughter called stating that she has noticed that her father was twitching in his legs for a couple of weeks but the pt never mentioned it so she left it alone. Today the pt mentioned it and is quite agitated and is being mean about it. Daughter would like to know if something can be done about it. Please advise.

## 2023-01-18 NOTE — Telephone Encounter (Signed)
Called and spoke to daughter about symptoms (per dpr) and she states he has started to have tremors and jerks throughout his lower extremities and has become very agitated in the last 24 hours about getting them to stop. I advised taking him to an urgent care or PCP to be evaluated promptly. She stated understanding and said she would update [place] of any changes. Will forward to Dr. April Manson

## 2023-01-21 NOTE — Progress Notes (Unsigned)
HPI male former smoker followed for COPD, history lung nodules, complicated by osteoarthritis, ascending aortic aneurysm Spirometry 01/24/12- FVC 2.24/ 64%, FEV1 1.20/ 42%, FEV1/FVC 0.53 PFT: 03/19/2012-moderate obstructive airways disease, insignificant response to bronchodilator , air trapping,normal diffusion capacity. Loop contour suggests emphysema. FVC 3.73/91%, FEV1 2.07/73%, FEV1/FVC 0.56. TLC 114%, RV 138% , DLCO 90% CT chest 10/01/13- Visualized small lung nodules are benign based on current consensus criteria  Office Spirometry 05/03/17-moderately severe obstruction. FVC 3.25/79%, FEV1 1.77/58%, ratio 0.54, FEF 25-75% 0.88/38%  ------------------------------------------------------------------------------------------------------------- 10/26/22- - 77 year old male former smoker followed for COPD, history lung nodules , R apical Lung Mass (resolved), T-Spine Epidural Abscess MSSA, BPH, Aortic Aneurysm, CAD, Hepatitis, Alzheimers, CKD3,   -Proair hfa, Breztri, Covid vax-3 Kent Flowers                        Son here Flu vax-today senior Recent ERCP SOB has not improved  He feels his breathing has been stable without major changes through the summer.  Cough tends to produce gray sputum with no blood or purulent discharge.  He needs inhalers refilled.  Son raised question of oxygenation.  Arrival room air saturation today 95% but we will check overnight oximetry.  Recent chest x-ray reviewed with no progressive process. Has spasticity in right hand which he attributes to complication of surgery, "not a stroke" CXR 09/17/22- MPRESSION: 1. No significant change from prior. 2. Chronic bronchitic markings. No clear acute cardiopulmonary findings.  01/22/23-77 year old male former smoker followed for COPD, history lung nodules , R apical Lung Mass (resolved), T-Spine Epidural Abscess MSSA, BPH, Aortic Aneurysm, CAD, Hepatitis, Alzheimers, CKD3,   -Proair hfa, Trelegy 200, Covid vax-3 Moderna                      Flu vax-had Daughter checks in on a about twice a week.  She and he noticed that he was using his rescue inhalers too fast.  He admits he had lost his Trelegy inhaler for quite a while.  Daughter is also noticed some crackling and wheezing in his breathing.  We discussed options and are going to try a nebulizer machine. Still has oxygen at home but has not used in a long time.  DME company never picked it up.  ROS-see HPI      + = positive Constitutional:   No-   weight loss, night sweats,  chills, fatigue, lassitude. HEENT:   No-  headaches, difficulty swallowing, tooth/dental problems,        No-  sneezing, itching, ear ache,  +nasal congestion, post nasal drip,  CV:  No- chest pain, orthopnea, PND, swelling in lower extremities, anasarca,  dizziness, palpitations Resp: +   shortness of breath with exertion or at rest.              +productive cough, no- non-productive cough,  No- coughing up of blood.             No change in color of mucus.  + wheezing.   Skin: No-   rash or lesions. GI:  No-   heartburn, indigestion, abdominal pain, nausea, vomiting GU: No-   dysuria,. MS:  +  joint pain or swelling.     Neuro-     nothing unusual Psych:  No- change in mood or affect. No depression or anxiety.  No memory loss.  OBJ- Physical Exam General- Alert, Oriented, Affect-appropriate, Distress- none acute,  Skin- rash-none, lesions- none, excoriation- none Lymphadenopathy- none Head-  atraumatic            Eyes- Gross vision intact, PERRLA, conjunctivae and secretions clear            Ears- Hearing,             Nose- Clear, no-Septal dev, mucus, polyps, erosion, perforation             Throat- Mallampati II , mucosa clear , drainage- none, tonsils- atrophic Neck- flexible , trachea midline, no stridor , thyroid nl, carotid no bruit Chest - symmetrical excursion , unlabored           Heart/CV- RRR , no murmur , no gallop  , no rub, nl s1 s2                           - JVD- none ,  edema- none, stasis changes- none, varices- none           Lung- + diminished, wheeze+bilateral, cough- none , dullness-none, rub- none           Chest wall-  Abd-  Br/ Gen/ Rectal- Not done, not indicated Extrem- cyanosis- none, clubbing, none, atrophy- none, strength- nl Neuro- + paresis R arm

## 2023-01-22 ENCOUNTER — Encounter: Payer: Self-pay | Admitting: Internal Medicine

## 2023-01-22 ENCOUNTER — Ambulatory Visit (INDEPENDENT_AMBULATORY_CARE_PROVIDER_SITE_OTHER): Payer: Medicare Other

## 2023-01-22 ENCOUNTER — Ambulatory Visit: Payer: Medicare Other | Admitting: Internal Medicine

## 2023-01-22 VITALS — BP 128/64 | HR 74 | Temp 98.1°F | Ht 67.0 in | Wt 162.5 lb

## 2023-01-22 DIAGNOSIS — J449 Chronic obstructive pulmonary disease, unspecified: Secondary | ICD-10-CM | POA: Diagnosis not present

## 2023-01-22 DIAGNOSIS — F02A0 Dementia in other diseases classified elsewhere, mild, without behavioral disturbance, psychotic disturbance, mood disturbance, and anxiety: Secondary | ICD-10-CM | POA: Diagnosis not present

## 2023-01-22 DIAGNOSIS — G301 Alzheimer's disease with late onset: Secondary | ICD-10-CM

## 2023-01-22 NOTE — Assessment & Plan Note (Signed)
Early in distress with an acute exacerbation but he does continue to wheeze. Plan-instead of Trelegy inhaler which she has trouble remembering, we are ordering a nebulizer machine and DuoNeb.  Daughter knows how to run her nebulizer and is just 1 her self.

## 2023-01-22 NOTE — Assessment & Plan Note (Signed)
Beginning to have more trouble remembering to use medication and misplacing inhalers. Plan-hopefully nebulizer machine will be easier for him to work with.

## 2023-01-22 NOTE — Patient Instructions (Signed)
Order- DME [employer], new compressor nebulizer   new Duoneb   360 ml   1 neb every 6 hours as needed       You can still use the rescue albuterol inhaler IF NEEDED, in between doses of the nebulizer machine.  Stop using the over the counter inhaler  Order- CXR   dx COPD exacerbation

## 2023-03-26 ENCOUNTER — Ambulatory Visit: Payer: Medicare Other | Admitting: Podiatry

## 2023-03-26 DIAGNOSIS — M79675 Pain in left toe(s): Secondary | ICD-10-CM | POA: Diagnosis not present

## 2023-03-26 DIAGNOSIS — M79674 Pain in right toe(s): Secondary | ICD-10-CM | POA: Diagnosis not present

## 2023-03-26 DIAGNOSIS — B351 Tinea unguium: Secondary | ICD-10-CM | POA: Diagnosis not present

## 2023-03-27 ENCOUNTER — Emergency Department (HOSPITAL_COMMUNITY): Payer: Medicare Other

## 2023-03-27 ENCOUNTER — Other Ambulatory Visit: Payer: Self-pay

## 2023-03-27 ENCOUNTER — Encounter (HOSPITAL_COMMUNITY): Payer: Self-pay | Admitting: Pharmacy Technician

## 2023-03-27 ENCOUNTER — Emergency Department (HOSPITAL_COMMUNITY)
Admission: EM | Admit: 2023-03-27 | Discharge: 2023-03-27 | Disposition: A | Discharge status: Home / Self Care | Payer: Medicare Other | Attending: Emergency Medicine | Admitting: Emergency Medicine

## 2023-03-27 DIAGNOSIS — N1330 Unspecified hydronephrosis: Secondary | ICD-10-CM | POA: Diagnosis not present

## 2023-03-27 DIAGNOSIS — F0390 Unspecified dementia without behavioral disturbance: Secondary | ICD-10-CM | POA: Diagnosis not present

## 2023-03-27 DIAGNOSIS — B9689 Other specified bacterial agents as the cause of diseases classified elsewhere: Secondary | ICD-10-CM | POA: Insufficient documentation

## 2023-03-27 DIAGNOSIS — S0101XA Laceration without foreign body of scalp, initial encounter: Secondary | ICD-10-CM

## 2023-03-27 DIAGNOSIS — Z7982 Long term (current) use of aspirin: Secondary | ICD-10-CM | POA: Diagnosis not present

## 2023-03-27 DIAGNOSIS — R000 Tachycardia, unspecified: Secondary | ICD-10-CM | POA: Insufficient documentation

## 2023-03-27 DIAGNOSIS — S8001XA Contusion of right knee, initial encounter: Secondary | ICD-10-CM | POA: Insufficient documentation

## 2023-03-27 DIAGNOSIS — I10 Essential (primary) hypertension: Secondary | ICD-10-CM | POA: Diagnosis not present

## 2023-03-27 DIAGNOSIS — Z79899 Other long term (current) drug therapy: Secondary | ICD-10-CM | POA: Diagnosis not present

## 2023-03-27 DIAGNOSIS — S8002XA Contusion of left knee, initial encounter: Secondary | ICD-10-CM | POA: Diagnosis not present

## 2023-03-27 DIAGNOSIS — S0181XA Laceration without foreign body of other part of head, initial encounter: Secondary | ICD-10-CM | POA: Insufficient documentation

## 2023-03-27 DIAGNOSIS — W01198A Fall on same level from slipping, tripping and stumbling with subsequent striking against other object, initial encounter: Secondary | ICD-10-CM | POA: Insufficient documentation

## 2023-03-27 DIAGNOSIS — W19XXXA Unspecified fall, initial encounter: Secondary | ICD-10-CM

## 2023-03-27 DIAGNOSIS — S0990XA Unspecified injury of head, initial encounter: Secondary | ICD-10-CM | POA: Diagnosis present

## 2023-03-27 DIAGNOSIS — N390 Urinary tract infection, site not specified: Secondary | ICD-10-CM | POA: Diagnosis not present

## 2023-03-27 LAB — URINALYSIS, ROUTINE W REFLEX MICROSCOPIC
Bilirubin Urine: NEGATIVE
Glucose, UA: NEGATIVE mg/dL
Ketones, ur: NEGATIVE mg/dL
Nitrite: NEGATIVE
Protein, ur: 100 mg/dL — AB
RBC / HPF: >50 RBC/hpf (ref 0–5)
Specific Gravity, Urine: 1.012 (ref 1.005–1.030)
WBC, UA: >50 WBC/hpf (ref 0–5)
pH: 6 (ref 5.0–8.0)

## 2023-03-27 LAB — COMPREHENSIVE METABOLIC PANEL
ALT: 11 U/L (ref 0–44)
AST: 21 U/L (ref 15–41)
Albumin: 3.9 g/dL (ref 3.5–5.0)
Alkaline Phosphatase: 118 U/L (ref 38–126)
Anion gap: 8 (ref 5–15)
BUN: 25 mg/dL — ABNORMAL HIGH (ref 8–23)
CO2: 24 mmol/L (ref 22–32)
Calcium: 8.6 mg/dL — ABNORMAL LOW (ref 8.9–10.3)
Chloride: 106 mmol/L (ref 98–111)
Creatinine, Ser: 1.49 mg/dL — ABNORMAL HIGH (ref 0.61–1.24)
GFR, Estimated: 49 mL/min — ABNORMAL LOW (ref >60)
Glucose, Bld: 121 mg/dL — ABNORMAL HIGH (ref 70–99)
Potassium: 3.5 mmol/L (ref 3.5–5.1)
Sodium: 138 mmol/L (ref 135–145)
Total Bilirubin: 1.1 mg/dL (ref 0.3–1.2)
Total Protein: 7.3 g/dL (ref 6.5–8.1)

## 2023-03-27 LAB — CBC WITH DIFFERENTIAL/PLATELET
Abs Immature Granulocytes: 0.05 10*3/uL (ref 0.00–0.07)
Basophils Absolute: 0 10*3/uL (ref 0.0–0.1)
Basophils Relative: 0 %
Eosinophils Absolute: 0.2 10*3/uL (ref 0.0–0.5)
Eosinophils Relative: 2 %
HCT: 47.9 % (ref 39.0–52.0)
Hemoglobin: 15.5 g/dL (ref 13.0–17.0)
Immature Granulocytes: 0 %
Lymphocytes Relative: 14 %
Lymphs Abs: 1.8 10*3/uL (ref 0.7–4.0)
MCH: 30.3 pg (ref 26.0–34.0)
MCHC: 32.4 g/dL (ref 30.0–36.0)
MCV: 93.6 fL (ref 80.0–100.0)
Monocytes Absolute: 1 10*3/uL (ref 0.1–1.0)
Monocytes Relative: 8 %
Neutro Abs: 9.3 10*3/uL — ABNORMAL HIGH (ref 1.7–7.7)
Neutrophils Relative %: 76 %
Platelets: 258 10*3/uL (ref 150–400)
RBC: 5.12 MIL/uL (ref 4.22–5.81)
RDW: 13.4 % (ref 11.5–15.5)
WBC: 12.3 10*3/uL — ABNORMAL HIGH (ref 4.0–10.5)
nRBC: 0 % (ref 0.0–0.2)

## 2023-03-27 LAB — CK: Total CK: 945 U/L — ABNORMAL HIGH (ref 49–397)

## 2023-03-27 LAB — BLOOD GAS, VENOUS
Acid-Base Excess: 5.3 mmol/L — ABNORMAL HIGH (ref 0.0–2.0)
Bicarbonate: 30.5 mmol/L — ABNORMAL HIGH (ref 20.0–28.0)
O2 Saturation: 91.1 %
Patient temperature: 37
pCO2, Ven: 46 mmHg (ref 44–60)
pH, Ven: 7.43 (ref 7.25–7.43)
pO2, Ven: 58 mmHg — ABNORMAL HIGH (ref 32–45)

## 2023-03-27 LAB — RAPID URINE DRUG SCREEN, HOSP PERFORMED
Amphetamines: NOT DETECTED
Barbiturates: NOT DETECTED
Benzodiazepines: NOT DETECTED
Cocaine: NOT DETECTED
Opiates: POSITIVE — AB
Tetrahydrocannabinol: NOT DETECTED

## 2023-03-27 LAB — ETHANOL: Alcohol, Ethyl (B): <10 mg/dL (ref <10)

## 2023-03-27 MED ORDER — LORAZEPAM 1 MG PO TABS
1.0000 mg | ORAL_TABLET | Freq: Once | ORAL | Status: AC
  Administered 2023-03-27: 1 mg via ORAL
  Filled 2023-03-27: qty 1

## 2023-03-27 MED ORDER — DOXYCYCLINE HYCLATE 100 MG PO TABS
100.0000 mg | ORAL_TABLET | Freq: Once | ORAL | Status: AC
  Administered 2023-03-27: 100 mg via ORAL
  Filled 2023-03-27: qty 1

## 2023-03-27 MED ORDER — SODIUM CHLORIDE 0.9 % IV BOLUS
1000.0000 mL | Freq: Once | INTRAVENOUS | Status: AC
  Administered 2023-03-27: 1000 mL via INTRAVENOUS

## 2023-03-27 MED ORDER — DOXYCYCLINE HYCLATE 100 MG PO CAPS: 100.0000 mg | ORAL_CAPSULE | Freq: Two times a day (BID) | ORAL | 0 refills | Status: DC

## 2023-03-27 MED ORDER — IOHEXOL 350 MG/ML SOLN
75.0000 mL | Freq: Once | INTRAVENOUS | Status: AC | PRN
  Administered 2023-03-27: 75 mL via INTRAVENOUS

## 2023-03-27 MED ORDER — OXYCODONE HCL 5 MG PO TABS
15.0000 mg | ORAL_TABLET | Freq: Once | ORAL | Status: AC
  Administered 2023-03-27: 15 mg via ORAL
  Filled 2023-03-27: qty 3

## 2023-03-27 MED ORDER — ALBUTEROL SULFATE (2.5 MG/3ML) 0.083% IN NEBU
5.0000 mg | INHALATION_SOLUTION | Freq: Once | RESPIRATORY_TRACT | Status: DC
  Filled 2023-03-27: qty 6

## 2023-03-27 MED ORDER — IPRATROPIUM BROMIDE 0.02 % IN SOLN
0.5000 mg | Freq: Once | RESPIRATORY_TRACT | Status: DC
  Filled 2023-03-27: qty 2.5

## 2023-03-27 MED ORDER — PIPERACILLIN-TAZOBACTAM 3.375 G IVPB 30 MIN: 3.3750 g | Freq: Once | INTRAVENOUS | Status: DC

## 2023-03-27 NOTE — ED Triage Notes (Signed)
Pt bib ems from home with reports of unwitnessed fall sometime overnight. Pt with lac to R eyebrow. Pt states not on anticoags and denies LOC. Pt caregiver came over at 1300 and found pt on the couch. Pt too weak to get up off of couch. EMS noticed labored breathing, with expiratory wheezing, given 5mg  albuterol and 0.5mg  atrovent en route. Redness noted to bil lower extremities along with edema.

## 2023-03-27 NOTE — ED Provider Notes (Signed)
[facility] AT [facility] Provider Note   CSN: [id number] Arrival date & time: 03/27/23  1415     History  Chief Complaint  Patient presents with   Kent Flowers is a 77 y.o. male history of dementia, alcohol abuse, chronic pain, hypertension, here presenting with fall.  Patient has caregiver from 1 Flowers to 5 Flowers every day.  Patient states that he got up around 1 AM in the morning and slipped and fell and hit his head.  He then tried to crawl back to the couch and caregiver found him on the couch with some blood in his forehead.  Patient admits to drinking some alcohol.  He was noted to have some wheezing and was given 5 mg of albuterol and 0.5 mg Atrovent prior to arrival.  Patient states that he takes oxycodone at baseline and is due for his next oxycodone.  The history is provided by the patient.       Home Medications Prior to Admission medications   Medication Sig Start Date End Date Taking? Authorizing Provider  albuterol (VENTOLIN HFA) 108 (90 Base) MCG/ACT inhaler Inhale 2 puffs into the lungs every 6 (six) hours as needed for wheezing or shortness of breath. 10/26/22   Jetty Duhamel D, MD  aspirin EC 81 MG tablet Take 1 tablet (81 mg total) by mouth daily. Swallow whole. 11/01/22   Windell Norfolk, MD  cyanocobalamin (VITAMIN B12) 1000 MCG tablet Take 1 tablet (1,000 mcg total) by mouth daily. 11/01/22 10/27/23  Windell Norfolk, MD  donepezil (ARICEPT) 10 MG tablet Take 1 tablet (10 mg total) by mouth at bedtime. 03/23/22   Windell Norfolk, MD  doxycycline (VIBRA-TABS) 100 MG tablet Take 100 mg by mouth 2 (two) times daily. 12/29/22   [provider]  memantine (NAMENDA) 10 MG tablet Take 1 tablet (10 mg total) by mouth 2 (two) times daily. 11/01/22 10/27/23  Windell Norfolk, MD  montelukast (SINGULAIR) 10 MG tablet SMARTSIG:1 Tablet(s) By Mouth Every Evening 11/13/22   [provider]  oxyCODONE (ROXICODONE) 15 MG immediate  release tablet Take 1 tablet (15 mg total) by mouth 5 (five) times daily as needed for pain. 01/20/22   Leatha Gilding, MD  QUEtiapine (SEROQUEL) 25 MG tablet Take 1 tablet (25 mg total) by mouth 2 (two) times daily. 11/01/22 05/30/23  Windell Norfolk, MD  tamsulosin (FLOMAX) 0.4 MG CAPS capsule Take 0.4 mg by mouth at bedtime. 09/04/22   [provider]  testosterone cypionate (DEPOTESTOSTERONE CYPIONATE) 200 MG/ML injection Inject 180 mg into the muscle every 14 (fourteen) days.  09/17/19   [provider]  tobramycin (TOBREX) 0.3 % ophthalmic solution SMARTSIG:In Eye(s) 12/02/22   [provider]  TRELEGY ELLIPTA 200-62.5-25 MCG/ACT AEPB Inhale 1 puff into the lungs daily. 10/19/22   [provider]      Allergies    Ceclor [cefaclor] and Sulfa antibiotics    Review of Systems   Review of Systems  Musculoskeletal:        Bilateral knee pain   Neurological:  Positive for tremors and weakness.  Psychiatric/Behavioral:  Positive for confusion.   All other systems reviewed and are negative.   Physical Exam Updated Vital Signs BP 134/70   Pulse 92   Temp 98.1 F (36.7 C)   Resp 13   SpO2 96%  Physical Exam Vitals and nursing note reviewed.  Constitutional:      Comments: Appears intoxicated and tremulous  HENT:     Head:     Comments: 2 cm laceration above the right eyebrow.    Nose: Nose normal.     Mouth/Throat:     Mouth: Mucous membranes are moist.  Eyes:     Extraocular Movements: Extraocular movements intact.     Pupils: Pupils are equal, round, and reactive to light.  Cardiovascular:     Rate and Rhythm: Regular rhythm. Tachycardia present.     Pulses: Normal pulses.     Heart sounds: Normal heart sounds.  Pulmonary:     Comments: Slightly tachypneic and mild diffuse wheezing. Abdominal:     General: Abdomen is flat.     Palpations: Abdomen is soft.  Musculoskeletal:     Cervical back: Normal range of motion and neck supple.      Comments: Bruising of bilateral knees.  Mild tenderness of the pelvis but normal range of motion bilateral hips  Neurological:     Comments: Appears intoxicated.  Patient is moving all extremities.  Patient does have some tremors  Psychiatric:        Mood and Affect: Mood normal.     ED Results / Procedures / Treatments   Labs (all labs ordered are listed, but only abnormal results are displayed) Labs Reviewed  CBC WITH DIFFERENTIAL/PLATELET  COMPREHENSIVE METABOLIC PANEL  ETHANOL  CK  BLOOD GAS, VENOUS  URINALYSIS, ROUTINE W REFLEX MICROSCOPIC  RAPID URINE DRUG SCREEN, HOSP PERFORMED    EKG EKG Interpretation  Date/Time:  Tuesday March 27 2023 14:28:57 EDT Ventricular Rate:  98 PR Interval:  172 QRS Duration: 85 QT Interval:  342 QTC Calculation: 437 R Axis:   -39 Text Interpretation: Sinus rhythm Abnormal R-wave progression, early transition Inferior infarct, old Consider anterior infarct No significant change since last tracing Confirmed by Kent CanalYao, Kent Flowers  Radiology DG Chest Port 1 View  Result Date: 03/27/2023 CLINICAL DATA:  Fall EXAM: PORTABLE CHEST 1 VIEW COMPARISON:  Chest x-ray 01/22/2023 FINDINGS: Small amount of focal airspace disease in the inferior right upper lobe appears unchanged. There is no pleural effusion or pneumothorax. Cardiomediastinal silhouette is within normal limits. No acute fractures are seen. IMPRESSION: Small amount of focal airspace disease in the inferior right upper lobe appears unchanged. Findings are concerning for pneumonia. Other etiologies are not excluded given stability, therefore follow-up chest CT recommended. Electronically Signed   By: Kent CheneyAmy  Guttmann M.D.   On: 03/27/2023 15:51   DG Knee Right Port  Result Date: 03/27/2023 CLINICAL DATA:  Fall EXAM: PORTABLE RIGHT KNEE - 1-2 VIEW COMPARISON:  Radiograph 01/20/2022 FINDINGS: Partially visualized intramedullary nail in the femur. There is no evidence of  acute fracture. There is a small to moderate-sized joint effusion. There is soft tissue swelling along the knee. There is tricompartment osteophyte formation with severe medial compartment joint space narrowing, bone-on-bone articulation, progressed from prior exam. IMPRESSION: No evidence of acute fracture. Small to moderate-sized joint effusion. Tricompartment osteoarthritis, severe in the medial compartment, progressed from prior exam. Soft tissue swelling along the knee. Electronically Signed   By: Kent RenshawJacob  Kahn M.D.   On: 03/27/2023 15:42   DG Pelvis Portable  Result Date: 03/27/2023 CLINICAL DATA:  Unwitnessed fall EXAM: PORTABLE PELVIS 1-2 VIEWS COMPARISON:  None Available. FINDINGS: Osteopenia. No acute displaced fracture or dislocation of the pelvis. Intramedullary nail fixation about callused intratrochanteric fractures of the right hip. IMPRESSION: 1. Osteopenia. No acute displaced fracture or dislocation of the pelvis. Please note that radiographs  are significantly insensitive for hip and pelvic fracture. 2. Intramedullary nail fixation about callused intratrochanteric fractures of the right hip. Electronically Signed   By: Kent Lesch M.D.   On: 03/27/2023 15:41   DG Knee Left Port  Result Date: 03/27/2023 CLINICAL DATA:  Fall EXAM: PORTABLE LEFT KNEE - 1-2 VIEW COMPARISON:  Left knee radiograph 01/20/2022 FINDINGS: There is a possible depressed lateral tibial plateau fracture. There is a small to moderate-sized joint effusion. There is soft tissue swelling along the knee. Tricompartment osteophyte formation with severe medial compartment joint space narrowing. Vascular calcifications. IMPRESSION: Possible lateral tibial plateau fracture. Small to moderate-sized joint effusion. Tricompartment osteoarthritis, severe in the medial compartment. Soft tissue swelling along the knee. Electronically Signed   By: Kent Renshaw M.D.   On: 03/27/2023 15:40    Procedures Procedures    LACERATION  REPAIR Performed by: Kent Flowers Authorized by: Kent Flowers Consent: Verbal consent obtained. Risks and benefits: risks, benefits and alternatives were discussed Consent given by: patient Patient identity confirmed: provided demographic data Prepped and Draped in normal sterile fashion Wound explored  Laceration Location: R forehead   Laceration Length:  2 cm  No Foreign Bodies seen or palpated  Anesthesia: local infiltration  Local anesthetic: none   Irrigation method: syringe Amount of cleaning: standard  Skin closure: dermabond   Technique: dermabond   Patient tolerance: Patient tolerated the procedure well with no immediate complications.   Medications Ordered in ED Medications  oxyCODONE (Oxy IR/ROXICODONE) immediate release tablet 15 mg (0 mg Oral Hold 03/27/23 1600)  sodium chloride 0.9 % bolus 1,000 mL (1,000 mLs Intravenous New Bag/Given 03/27/23 1556)    ED Course/ Medical Decision Making/ A&P                             Medical Decision Making Kent Flowers is a 77 y.o. male here presenting with fall.  Patient fell last night.  Patient does have a laceration above the right eyebrow.  Plan to get CT head and neck and cervical spine to rule out fracture or intracranial bleeding.  Patient was also tachycardic and tremulous and wheezing so consider COPD versus pneumonia.  He is also on the couch for several hours so consider rhabdomyolysis as well.  Plan to get CBC and CMP and CK level and alcohol level and trauma scan.  7:49 Flowers I reviewed patient's labs and independently interpreted imaging studies.  Patient's alcohol level is negative.  Patient has a CK level is 9045.  Patient has UTI on urinalysis.  Patient CT head showed no bleeding or fracture.  CT chest showed no PE or pneumonia.  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  Patient does have a UTI and he was allergic to cephalosporin and Bactrim so we will prescribe  doxycycline.  Patient's daughter is not at bedside.  She states that she is comfortable taking father home.  Problems Addressed: Fall, initial encounter: acute illness or injury Hydronephrosis, unspecified hydronephrosis type: chronic illness or injury Scalp laceration, initial encounter: acute illness or injury Urinary tract infection without hematuria, site unspecified: acute illness or injury  Amount and/or Complexity of Data Reviewed Labs: ordered. Decision-making details documented in ED Course. Radiology: ordered and independent interpretation performed. Decision-making details documented in ED Course.  Risk Prescription drug management.    Final Clinical Impression(s) / ED Diagnoses Final diagnoses:  None    Rx /  DC Orders ED Discharge Orders     None         Kent Pander, MD 03/27/23 [phone]

## 2023-03-27 NOTE — Discharge Instructions (Signed)
You have a urinary tract infection.  Please take doxycycline twice a day for a week  Please stay hydrated.  As we discussed you do not have any bleeding inside your brain from the fall.  You have a superficial laceration on your scalp that was glued and it will fall off in several days  See your doctor for follow-up  Please avoid drinking alcohol  Return to ER if you have another fall, weakness or dizziness

## 2023-03-28 ENCOUNTER — Telehealth (HOSPITAL_COMMUNITY): Payer: Self-pay | Admitting: Physician Assistant

## 2023-03-28 MED ORDER — DOXYCYCLINE HYCLATE 100 MG PO CAPS: 100.0000 mg | ORAL_CAPSULE | Freq: Two times a day (BID) | ORAL | 0 refills | Status: AC

## 2023-03-28 NOTE — Progress Notes (Signed)
Subjective:   Patient ID: Kent Flowers, male   DOB: 77 y.o.   MRN: [id number]   HPI Patient presents stating his nails have really elongated they are thick dystrophic and they are painful and he cannot take care of them   ROS      Objective:  Physical Exam  Neuro vas status unchanged patient is found to have thick yellow brittle nailbeds 1-5 both feet painful when pressed     Assessment:  Chronic mycotic nail infection with pain 1-5 both feet     Plan:  Debridement painful nailbeds 1-5 both feet no angiogenic bleeding reappoint routine

## 2023-03-28 NOTE — Telephone Encounter (Signed)
Patient was discharged yesterday, he did not fill his prescription.  He reports throwing his prescription away in the trash as the prescription was printed.  A new prescription was sent per request.  Doxy x 7 days.

## 2023-04-05 ENCOUNTER — Ambulatory Visit: Payer: Medicare Other | Admitting: Neurology

## 2023-04-23 ENCOUNTER — Other Ambulatory Visit: Payer: Self-pay | Admitting: Neurology

## 2023-05-02 ENCOUNTER — Ambulatory Visit: Payer: Medicare Other | Admitting: Neurology

## 2023-05-02 ENCOUNTER — Encounter: Payer: Self-pay | Admitting: Neurology

## 2023-05-02 VITALS — BP 128/72 | HR 76 | Ht 67.0 in | Wt 162.5 lb

## 2023-05-02 DIAGNOSIS — Z789 Other specified health status: Secondary | ICD-10-CM | POA: Diagnosis not present

## 2023-05-02 DIAGNOSIS — F02B18 Dementia in other diseases classified elsewhere, moderate, with other behavioral disturbance: Secondary | ICD-10-CM | POA: Diagnosis not present

## 2023-05-02 DIAGNOSIS — G301 Alzheimer's disease with late onset: Secondary | ICD-10-CM | POA: Diagnosis not present
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
## 2023-05-04 NOTE — Patient Instructions (Addendum)
Restart Aricept 5 mg daily for 2 weeks then increase to 10 mg daily Continue with Seroquel 25 mg twice daily Continue your other medications Discontinue Namenda since you had side effects from the medication Discontinue alcohol or at least reduce it to one glass a day Continue to follow PCP and return in a year

## 2023-05-23 NOTE — Progress Notes (Deleted)
HPI male former smoker followed for COPD, history lung nodules, complicated by osteoarthritis, ascending aortic aneurysm Spirometry 01/24/12- FVC 2.24/ 64%, FEV1 1.20/ 42%, FEV1/FVC 0.53 PFT: 03/19/2012-moderate obstructive airways disease, insignificant response to bronchodilator , air trapping,normal diffusion capacity. Loop contour suggests emphysema. FVC 3.73/91%, FEV1 2.07/73%, FEV1/FVC 0.56. TLC 114%, RV 138% , DLCO 90% CT chest 10/01/13- Visualized small lung nodules are benign based on current consensus criteria  Office Spirometry 05/03/17-moderately severe obstruction. FVC 3.25/79%, FEV1 1.77/58%, ratio 0.54, FEF 25-75% 0.88/38%  -------------------------------------------------------------------------------------------------------------   01/22/23-77 year old male former smoker followed for COPD, history lung nodules , R apical Lung Mass (resolved), T-Spine Epidural Abscess MSSA, BPH, Aortic Aneurysm, CAD, Hepatitis, Alzheimers, CKD3,   -Proair hfa, Trelegy 200, Covid vax-3 Moderna                     Flu vax-had Daughter checks in on a about twice a week.  She and he noticed that he was using his rescue inhalers too fast.  He admits he had lost his Trelegy inhaler for quite a while.  Daughter is also noticed some crackling and wheezing in his breathing.  We discussed options and are going to try a nebulizer machine. Still has oxygen at home but has not used in a long time.  DME company never picked it up.  05/28/23- 77 year old male former smoker followed for COPD, history lung nodules , R apical Lung Mass (resolved), T-Spine Epidural Abscess MSSA, BPH, Aortic Aneurysm, CAD, Hepatitis, Alzheimers, CKD3,   -Proair hfa, Trelegy 200,    ROS-see HPI      + = positive Constitutional:   No-   weight loss, night sweats,  chills, fatigue, lassitude. HEENT:   No-  headaches, difficulty swallowing, tooth/dental problems,        No-  sneezing, itching, ear ache,  +nasal congestion, post nasal  drip,  CV:  No- chest pain, orthopnea, PND, swelling in lower extremities, anasarca,  dizziness, palpitations Resp: +   shortness of breath with exertion or at rest.              +productive cough, no- non-productive cough,  No- coughing up of blood.             No change in color of mucus.  + wheezing.   Skin: No-   rash or lesions. GI:  No-   heartburn, indigestion, abdominal pain, nausea, vomiting GU: No-   dysuria,. MS:  +  joint pain or swelling.     Neuro-     nothing unusual Psych:  No- change in mood or affect. No depression or anxiety.  No memory loss.  OBJ- Physical Exam General- Alert, Oriented, Affect-appropriate, Distress- none acute,  Skin- rash-none, lesions- none, excoriation- none Lymphadenopathy- none Head- atraumatic            Eyes- Gross vision intact, PERRLA, conjunctivae and secretions clear            Ears- Hearing,             Nose- Clear, no-Septal dev, mucus, polyps, erosion, perforation             Throat- Mallampati II , mucosa clear , drainage- none, tonsils- atrophic Neck- flexible , trachea midline, no stridor , thyroid nl, carotid no bruit Chest - symmetrical excursion , unlabored           Heart/CV- RRR , no murmur , no gallop  , no rub, nl s1 s2                           -  JVD- none , edema- none, stasis changes- none, varices- none           Lung- + diminished, wheeze+bilateral, cough- none , dullness-none, rub- none           Chest wall-  Abd-  Br/ Gen/ Rectal- Not done, not indicated Extrem- cyanosis- none, clubbing, none, atrophy- none, strength- nl Neuro- + paresis R arm

## 2023-05-28 ENCOUNTER — Ambulatory Visit: Payer: Medicare Other | Admitting: Internal Medicine

## 2023-07-05 ENCOUNTER — Other Ambulatory Visit: Payer: Self-pay | Admitting: Neurology

## 2023-07-06 NOTE — Progress Notes (Signed)
HPI male former smoker followed for COPD, history lung nodules, complicated by osteoarthritis, ascending aortic aneurysm Spirometry 01/24/12- FVC 2.24/ 64%, FEV1 1.20/ 42%, FEV1/FVC 0.53 PFT: 03/19/2012-moderate obstructive airways disease, insignificant response to bronchodilator , air trapping,normal diffusion capacity. Loop contour suggests emphysema. FVC 3.73/91%, FEV1 2.07/73%, FEV1/FVC 0.56. TLC 114%, RV 138% , DLCO 90% CT chest 10/01/13- Visualized small lung nodules are benign based on current consensus criteria  Office Spirometry 05/03/17-moderately severe obstruction. FVC 3.25/79%, FEV1 1.77/58%, ratio 0.54, FEF 25-75% 0.88/38%  -------------------------------------------------------------------------------------------------------------   01/22/23-77 year old male former smoker followed for COPD, history lung nodules , R apical Lung Mass (resolved), T-Spine Epidural Abscess MSSA, BPH, Aortic Aneurysm, CAD, Hepatitis, Alzheimers, CKD3,   -Proair hfa, Trelegy 200, Covid vax-3 Moderna                     Flu vax-had Daughter checks in on a about twice a week.  She and he noticed that he was using his rescue inhalers too fast.  He admits he had lost his Trelegy inhaler for quite a while.  Daughter is also noticed some crackling and wheezing in his breathing.  We discussed options and are going to try a nebulizer machine. Still has oxygen at home but has not used in a long time.  DME company never picked it up.  07/09/23- 77 year old male former smoker followed for COPD, history lung nodules , R apical Lung Mass (resolved), T-Spine Epidural Abscess MSSA, BPH, Aortic Aneurysm, CAD, Hepatitis, Alzheimers, CKD3,   -Proair hfa, Trelegy 200,                            Not using his nebulizer machine but we suggested it might help.  He does continue with Trelegy and his rescue inhaler.  Goal for usage guidelines again. Activity is more limited.  At least a little cough most days, usually dry. CTaChest  PE 03/27/23-no evidence of PE  ROS-see HPI      + = positive Constitutional:   No-   weight loss, night sweats,  chills, fatigue, lassitude. HEENT:   No-  headaches, difficulty swallowing, tooth/dental problems,        No-  sneezing, itching, ear ache,  +nasal congestion, post nasal drip,  CV:  No- chest pain, orthopnea, PND, swelling in lower extremities, anasarca,  dizziness, palpitations Resp: +   shortness of breath with exertion or at rest.              +productive cough, no- non-productive cough,  No- coughing up of blood.             No change in color of mucus.  + wheezing.   Skin: No-   rash or lesions. GI:  No-   heartburn, indigestion, abdominal pain, nausea, vomiting GU: No-   dysuria,. MS:  +  joint pain or swelling.     Neuro-     nothing unusual Psych:  No- change in mood or affect. No depression or anxiety.  No memory loss.  OBJ- Physical Exam General- Alert, Oriented, Affect-appropriate, Distress- none acute,  Skin- rash-none, lesions- none, excoriation- none Lymphadenopathy- none Head- atraumatic            Eyes- Gross vision intact, PERRLA, conjunctivae and secretions clear            Ears- Hearing,             Nose- Clear, no-Septal dev, mucus, polyps, erosion, perforation  Throat- Mallampati II , mucosa clear , drainage- none, tonsils- atrophic Neck- flexible , trachea midline, no stridor , thyroid nl, carotid no bruit Chest - symmetrical excursion , unlabored           Heart/CV- RRR , no murmur , no gallop  , no rub, nl s1 s2                           - JVD- none , edema+1, stasis changes+, varices- none           Lung- + diminished, wheeze+trace, cough+loose , dullness-none, rub- none           Chest wall-  Abd-  Br/ Gen/ Rectal- Not done, not indicated Extrem- cyanosis- none, clubbing, none, atrophy- none, strength- nl Neuro- + paresis R arm

## 2023-07-09 ENCOUNTER — Encounter: Payer: Self-pay | Admitting: Internal Medicine

## 2023-07-09 ENCOUNTER — Ambulatory Visit: Payer: Medicare Other | Admitting: Internal Medicine

## 2023-07-09 VITALS — BP 124/60 | HR 68 | Ht 67.0 in | Wt 166.0 lb

## 2023-07-09 DIAGNOSIS — G301 Alzheimer's disease with late onset: Secondary | ICD-10-CM | POA: Diagnosis not present

## 2023-07-09 DIAGNOSIS — F02A0 Dementia in other diseases classified elsewhere, mild, without behavioral disturbance, psychotic disturbance, mood disturbance, and anxiety: Secondary | ICD-10-CM

## 2023-07-09 DIAGNOSIS — J441 Chronic obstructive pulmonary disease with (acute) exacerbation: Secondary | ICD-10-CM | POA: Diagnosis not present

## 2023-07-09 NOTE — Patient Instructions (Signed)
We can continue current meds  Ok to use your nebulizer machine with albuterol- 1 neb every 6 hours, as needed

## 2023-07-13 ENCOUNTER — Other Ambulatory Visit: Payer: Self-pay

## 2023-07-13 ENCOUNTER — Encounter (HOSPITAL_COMMUNITY): Payer: Self-pay

## 2023-07-13 ENCOUNTER — Emergency Department (HOSPITAL_COMMUNITY): Payer: Medicare Other

## 2023-07-13 ENCOUNTER — Observation Stay (HOSPITAL_COMMUNITY): Payer: Medicare Other

## 2023-07-13 ENCOUNTER — Inpatient Hospital Stay (HOSPITAL_COMMUNITY)
Admission: EM | Admit: 2023-07-13 | Discharge: 2023-08-07 | DRG: 871 | Disposition: A | Discharge status: Skilled Nursing Facility | Payer: Medicare Other | Attending: Family Medicine | Admitting: Family Medicine

## 2023-07-13 DIAGNOSIS — J189 Pneumonia, unspecified organism: Secondary | ICD-10-CM | POA: Diagnosis present

## 2023-07-13 DIAGNOSIS — J439 Emphysema, unspecified: Secondary | ICD-10-CM | POA: Diagnosis present

## 2023-07-13 DIAGNOSIS — J9601 Acute respiratory failure with hypoxia: Secondary | ICD-10-CM | POA: Diagnosis present

## 2023-07-13 DIAGNOSIS — R0602 Shortness of breath: Secondary | ICD-10-CM | POA: Diagnosis present

## 2023-07-13 DIAGNOSIS — M4812 Ankylosing hyperostosis [Forestier], cervical region: Secondary | ICD-10-CM | POA: Diagnosis present

## 2023-07-13 DIAGNOSIS — R338 Other retention of urine: Secondary | ICD-10-CM | POA: Diagnosis not present

## 2023-07-13 DIAGNOSIS — E538 Deficiency of other specified B group vitamins: Secondary | ICD-10-CM | POA: Diagnosis present

## 2023-07-13 DIAGNOSIS — J9621 Acute and chronic respiratory failure with hypoxia: Secondary | ICD-10-CM | POA: Diagnosis present

## 2023-07-13 DIAGNOSIS — Z66 Do not resuscitate: Secondary | ICD-10-CM | POA: Diagnosis present

## 2023-07-13 DIAGNOSIS — R296 Repeated falls: Secondary | ICD-10-CM

## 2023-07-13 DIAGNOSIS — G8320 Monoplegia of upper limb affecting unspecified side: Secondary | ICD-10-CM

## 2023-07-13 DIAGNOSIS — N179 Acute kidney failure, unspecified: Secondary | ICD-10-CM | POA: Diagnosis not present

## 2023-07-13 DIAGNOSIS — W19XXXA Unspecified fall, initial encounter: Secondary | ICD-10-CM | POA: Diagnosis present

## 2023-07-13 DIAGNOSIS — Z87440 Personal history of urinary (tract) infections: Secondary | ICD-10-CM

## 2023-07-13 DIAGNOSIS — Z789 Other specified health status: Secondary | ICD-10-CM | POA: Diagnosis present

## 2023-07-13 DIAGNOSIS — N1832 Chronic kidney disease, stage 3b: Secondary | ICD-10-CM | POA: Diagnosis present

## 2023-07-13 DIAGNOSIS — L309 Dermatitis, unspecified: Secondary | ICD-10-CM | POA: Diagnosis present

## 2023-07-13 DIAGNOSIS — Z79899 Other long term (current) drug therapy: Secondary | ICD-10-CM

## 2023-07-13 DIAGNOSIS — N401 Enlarged prostate with lower urinary tract symptoms: Secondary | ICD-10-CM | POA: Diagnosis present

## 2023-07-13 DIAGNOSIS — M545 Low back pain, unspecified: Secondary | ICD-10-CM | POA: Diagnosis present

## 2023-07-13 DIAGNOSIS — N3091 Cystitis, unspecified with hematuria: Secondary | ICD-10-CM | POA: Diagnosis present

## 2023-07-13 DIAGNOSIS — T402X5A Adverse effect of other opioids, initial encounter: Secondary | ICD-10-CM | POA: Diagnosis present

## 2023-07-13 DIAGNOSIS — A419 Sepsis, unspecified organism: Principal | ICD-10-CM | POA: Diagnosis present

## 2023-07-13 DIAGNOSIS — Z882 Allergy status to sulfonamides status: Secondary | ICD-10-CM

## 2023-07-13 DIAGNOSIS — S12110D Anterior displaced Type II dens fracture, subsequent encounter for fracture with routine healing: Secondary | ICD-10-CM

## 2023-07-13 DIAGNOSIS — R4182 Altered mental status, unspecified: Secondary | ICD-10-CM | POA: Insufficient documentation

## 2023-07-13 DIAGNOSIS — K5903 Drug induced constipation: Secondary | ICD-10-CM | POA: Diagnosis present

## 2023-07-13 DIAGNOSIS — R6339 Other feeding difficulties: Secondary | ICD-10-CM | POA: Diagnosis present

## 2023-07-13 DIAGNOSIS — Z825 Family history of asthma and other chronic lower respiratory diseases: Secondary | ICD-10-CM

## 2023-07-13 DIAGNOSIS — Z7982 Long term (current) use of aspirin: Secondary | ICD-10-CM

## 2023-07-13 DIAGNOSIS — R0902 Hypoxemia: Secondary | ICD-10-CM | POA: Diagnosis present

## 2023-07-13 DIAGNOSIS — Z1152 Encounter for screening for COVID-19: Secondary | ICD-10-CM

## 2023-07-13 DIAGNOSIS — M40202 Unspecified kyphosis, cervical region: Secondary | ICD-10-CM | POA: Diagnosis present

## 2023-07-13 DIAGNOSIS — Z881 Allergy status to other antibiotic agents status: Secondary | ICD-10-CM

## 2023-07-13 DIAGNOSIS — F17210 Nicotine dependence, cigarettes, uncomplicated: Secondary | ICD-10-CM | POA: Diagnosis present

## 2023-07-13 DIAGNOSIS — W19XXXD Unspecified fall, subsequent encounter: Secondary | ICD-10-CM | POA: Diagnosis present

## 2023-07-13 DIAGNOSIS — R310 Gross hematuria: Secondary | ICD-10-CM | POA: Insufficient documentation

## 2023-07-13 DIAGNOSIS — G894 Chronic pain syndrome: Secondary | ICD-10-CM | POA: Diagnosis present

## 2023-07-13 DIAGNOSIS — Z86008 Personal history of in-situ neoplasm of other site: Secondary | ICD-10-CM

## 2023-07-13 DIAGNOSIS — Z751 Person awaiting admission to adequate facility elsewhere: Secondary | ICD-10-CM

## 2023-07-13 DIAGNOSIS — R6520 Severe sepsis without septic shock: Secondary | ICD-10-CM | POA: Diagnosis present

## 2023-07-13 DIAGNOSIS — M542 Cervicalgia: Secondary | ICD-10-CM | POA: Diagnosis present

## 2023-07-13 DIAGNOSIS — I890 Lymphedema, not elsewhere classified: Secondary | ICD-10-CM | POA: Diagnosis present

## 2023-07-13 DIAGNOSIS — S161XXA Strain of muscle, fascia and tendon at neck level, initial encounter: Secondary | ICD-10-CM | POA: Diagnosis present

## 2023-07-13 DIAGNOSIS — R159 Full incontinence of feces: Secondary | ICD-10-CM | POA: Diagnosis present

## 2023-07-13 DIAGNOSIS — G309 Alzheimer's disease, unspecified: Secondary | ICD-10-CM | POA: Diagnosis present

## 2023-07-13 DIAGNOSIS — R600 Localized edema: Secondary | ICD-10-CM | POA: Insufficient documentation

## 2023-07-13 DIAGNOSIS — Z79891 Long term (current) use of opiate analgesic: Secondary | ICD-10-CM

## 2023-07-13 DIAGNOSIS — R251 Tremor, unspecified: Secondary | ICD-10-CM | POA: Diagnosis present

## 2023-07-13 DIAGNOSIS — R32 Unspecified urinary incontinence: Secondary | ICD-10-CM | POA: Diagnosis present

## 2023-07-13 DIAGNOSIS — G8321 Monoplegia of upper limb affecting right dominant side: Secondary | ICD-10-CM | POA: Diagnosis present

## 2023-07-13 DIAGNOSIS — E860 Dehydration: Secondary | ICD-10-CM | POA: Diagnosis present

## 2023-07-13 DIAGNOSIS — J159 Unspecified bacterial pneumonia: Secondary | ICD-10-CM | POA: Diagnosis present

## 2023-07-13 DIAGNOSIS — M1990 Unspecified osteoarthritis, unspecified site: Secondary | ICD-10-CM | POA: Diagnosis present

## 2023-07-13 DIAGNOSIS — Z8619 Personal history of other infectious and parasitic diseases: Secondary | ICD-10-CM

## 2023-07-13 DIAGNOSIS — F02A0 Dementia in other diseases classified elsewhere, mild, without behavioral disturbance, psychotic disturbance, mood disturbance, and anxiety: Secondary | ICD-10-CM | POA: Diagnosis present

## 2023-07-13 DIAGNOSIS — J440 Chronic obstructive pulmonary disease with acute lower respiratory infection: Secondary | ICD-10-CM | POA: Diagnosis present

## 2023-07-13 DIAGNOSIS — J441 Chronic obstructive pulmonary disease with (acute) exacerbation: Secondary | ICD-10-CM | POA: Diagnosis present

## 2023-07-13 LAB — CBC WITH DIFFERENTIAL/PLATELET
Abs Immature Granulocytes: 0.21 10*3/uL — ABNORMAL HIGH (ref 0.00–0.07)
Basophils Absolute: 0 10*3/uL (ref 0.0–0.1)
Basophils Relative: 0 %
Eosinophils Absolute: 0 10*3/uL (ref 0.0–0.5)
Eosinophils Relative: 0 %
HCT: 40.4 % (ref 39.0–52.0)
Hemoglobin: 12.9 g/dL — ABNORMAL LOW (ref 13.0–17.0)
Immature Granulocytes: 1 %
Lymphocytes Relative: 5 %
Lymphs Abs: 1.5 10*3/uL (ref 0.7–4.0)
MCH: 30.1 pg (ref 26.0–34.0)
MCHC: 31.9 g/dL (ref 30.0–36.0)
MCV: 94.4 fL (ref 80.0–100.0)
Monocytes Absolute: 1.1 10*3/uL — ABNORMAL HIGH (ref 0.1–1.0)
Monocytes Relative: 4 %
Neutro Abs: 25.6 10*3/uL — ABNORMAL HIGH (ref 1.7–7.7)
Neutrophils Relative %: 90 %
Platelets: 292 10*3/uL (ref 150–400)
RBC: 4.28 MIL/uL (ref 4.22–5.81)
RDW: 14.8 % (ref 11.5–15.5)
WBC: 28.5 10*3/uL — ABNORMAL HIGH (ref 4.0–10.5)
nRBC: 0 % (ref 0.0–0.2)

## 2023-07-13 LAB — I-STAT VENOUS BLOOD GAS, ED
Acid-Base Excess: 1 mmol/L (ref 0.0–2.0)
Bicarbonate: 25.7 mmol/L (ref 20.0–28.0)
Calcium, Ion: 1.01 mmol/L — ABNORMAL LOW (ref 1.15–1.40)
HCT: 41 % (ref 39.0–52.0)
Hemoglobin: 13.9 g/dL (ref 13.0–17.0)
O2 Saturation: 99 %
Potassium: 4.7 mmol/L (ref 3.5–5.1)
Sodium: 137 mmol/L (ref 135–145)
TCO2: 27 mmol/L (ref 22–32)
pCO2, Ven: 41.2 mmHg — ABNORMAL LOW (ref 44–60)
pH, Ven: 7.403 (ref 7.25–7.43)
pO2, Ven: 130 mmHg — ABNORMAL HIGH (ref 32–45)

## 2023-07-13 LAB — URINALYSIS, ROUTINE W REFLEX MICROSCOPIC

## 2023-07-13 LAB — COMPREHENSIVE METABOLIC PANEL
ALT: 16 U/L (ref 0–44)
AST: 26 U/L (ref 15–41)
Albumin: 3.3 g/dL — ABNORMAL LOW (ref 3.5–5.0)
Alkaline Phosphatase: 71 U/L (ref 38–126)
Anion gap: 11 (ref 5–15)
BUN: 23 mg/dL (ref 8–23)
CO2: 22 mmol/L (ref 22–32)
Calcium: 7.8 mg/dL — ABNORMAL LOW (ref 8.9–10.3)
Chloride: 105 mmol/L (ref 98–111)
Creatinine, Ser: 1.5 mg/dL — ABNORMAL HIGH (ref 0.61–1.24)
GFR, Estimated: 48 mL/min — ABNORMAL LOW (ref >60)
Glucose, Bld: 166 mg/dL — ABNORMAL HIGH (ref 70–99)
Potassium: 4.1 mmol/L (ref 3.5–5.1)
Sodium: 138 mmol/L (ref 135–145)
Total Bilirubin: 1.1 mg/dL (ref 0.3–1.2)
Total Protein: 6 g/dL — ABNORMAL LOW (ref 6.5–8.1)

## 2023-07-13 LAB — TROPONIN I (HIGH SENSITIVITY)
Troponin I (High Sensitivity): 16 ng/L (ref <18)
Troponin I (High Sensitivity): 19 ng/L — ABNORMAL HIGH (ref <18)
Troponin I (High Sensitivity): 14 ng/L (ref <18)
Troponin I (High Sensitivity): 15 ng/L (ref <18)

## 2023-07-13 LAB — SARS CORONAVIRUS 2 BY RT PCR: SARS Coronavirus 2 by RT PCR: NEGATIVE

## 2023-07-13 LAB — URINALYSIS, MICROSCOPIC (REFLEX): RBC / HPF: >50 RBC/hpf (ref 0–5)

## 2023-07-13 LAB — BRAIN NATRIURETIC PEPTIDE: B Natriuretic Peptide: 51.7 pg/mL (ref 0.0–100.0)

## 2023-07-13 MED ORDER — SODIUM CHLORIDE 0.9 % IV SOLN
1.0000 g | INTRAVENOUS | Status: DC
  Administered 2023-07-13 – 2023-07-14 (×2): 1 g via INTRAVENOUS
  Filled 2023-07-13 (×2): qty 10

## 2023-07-13 MED ORDER — IPRATROPIUM-ALBUTEROL 0.5-2.5 (3) MG/3ML IN SOLN
3.0000 mL | Freq: Four times a day (QID) | RESPIRATORY_TRACT | Status: DC
  Administered 2023-07-14: 3 mL via RESPIRATORY_TRACT
  Filled 2023-07-13: qty 3

## 2023-07-13 MED ORDER — UMECLIDINIUM BROMIDE 62.5 MCG/ACT IN AEPB
1.0000 | INHALATION_SPRAY | Freq: Every day | RESPIRATORY_TRACT | Status: DC
  Administered 2023-07-14 – 2023-08-07 (×23): 1 via RESPIRATORY_TRACT
  Filled 2023-07-13 (×4): qty 7

## 2023-07-13 MED ORDER — ASPIRIN 81 MG PO TBEC
81.0000 mg | DELAYED_RELEASE_TABLET | Freq: Every day | ORAL | Status: DC
  Administered 2023-07-13 – 2023-08-07 (×26): 81 mg via ORAL
  Filled 2023-07-13 (×26): qty 1

## 2023-07-13 MED ORDER — OXYCODONE HCL 5 MG PO TABS: 10.0000 mg | ORAL_TABLET | Freq: Once | ORAL | Status: DC

## 2023-07-13 MED ORDER — IPRATROPIUM-ALBUTEROL 0.5-2.5 (3) MG/3ML IN SOLN
3.0000 mL | RESPIRATORY_TRACT | Status: DC
  Administered 2023-07-13 (×2): 3 mL via RESPIRATORY_TRACT
  Filled 2023-07-13 (×2): qty 3

## 2023-07-13 MED ORDER — THIAMINE MONONITRATE 100 MG PO TABS
100.0000 mg | ORAL_TABLET | Freq: Every day | ORAL | Status: DC
  Administered 2023-07-13 – 2023-08-07 (×26): 100 mg via ORAL
  Filled 2023-07-13 (×26): qty 1

## 2023-07-13 MED ORDER — LIDOCAINE 5 % EX PTCH
1.0000 | MEDICATED_PATCH | CUTANEOUS | Status: DC
  Administered 2023-07-13 – 2023-08-06 (×25): 1 via TRANSDERMAL
  Filled 2023-07-13 (×26): qty 1

## 2023-07-13 MED ORDER — FOLIC ACID 1 MG PO TABS
1.0000 mg | ORAL_TABLET | Freq: Every day | ORAL | Status: DC
  Administered 2023-07-13 – 2023-08-07 (×26): 1 mg via ORAL
  Filled 2023-07-13 (×26): qty 1

## 2023-07-13 MED ORDER — OXYCODONE HCL 5 MG PO TABS
5.0000 mg | ORAL_TABLET | Freq: Once | ORAL | Status: AC
  Administered 2023-07-13: 5 mg via ORAL
  Filled 2023-07-13: qty 1

## 2023-07-13 MED ORDER — OXYCODONE HCL 5 MG PO TABS
10.0000 mg | ORAL_TABLET | Freq: Four times a day (QID) | ORAL | Status: DC | PRN
  Administered 2023-07-13: 10 mg via ORAL
  Filled 2023-07-13: qty 2

## 2023-07-13 MED ORDER — IPRATROPIUM-ALBUTEROL 0.5-2.5 (3) MG/3ML IN SOLN: 3.0000 mL | RESPIRATORY_TRACT | Status: DC | PRN

## 2023-07-13 MED ORDER — VITAMIN B-12 1000 MCG PO TABS
1000.0000 ug | ORAL_TABLET | Freq: Every day | ORAL | Status: DC
  Administered 2023-07-13 – 2023-08-07 (×26): 1000 ug via ORAL
  Filled 2023-07-13 (×26): qty 1

## 2023-07-13 MED ORDER — ADULT MULTIVITAMIN W/MINERALS CH
1.0000 | ORAL_TABLET | Freq: Every day | ORAL | Status: DC
  Administered 2023-07-13 – 2023-08-07 (×25): 1 via ORAL
  Filled 2023-07-13 (×26): qty 1

## 2023-07-13 MED ORDER — QUETIAPINE FUMARATE 50 MG PO TABS
25.0000 mg | ORAL_TABLET | Freq: Two times a day (BID) | ORAL | Status: DC
  Administered 2023-07-13 – 2023-08-07 (×49): 25 mg via ORAL
  Filled 2023-07-13 (×51): qty 1

## 2023-07-13 MED ORDER — DONEPEZIL HCL 10 MG PO TABS
10.0000 mg | ORAL_TABLET | Freq: Every day | ORAL | Status: DC
  Administered 2023-07-13 – 2023-08-06 (×25): 10 mg via ORAL
  Filled 2023-07-13 (×25): qty 1

## 2023-07-13 MED ORDER — OXYCODONE HCL 5 MG PO TABS
15.0000 mg | ORAL_TABLET | Freq: Once | ORAL | Status: AC
  Administered 2023-07-13: 15 mg via ORAL
  Filled 2023-07-13: qty 3

## 2023-07-13 MED ORDER — MORPHINE SULFATE (PF) 4 MG/ML IV SOLN
4.0000 mg | Freq: Once | INTRAVENOUS | Status: AC
  Administered 2023-07-13: 4 mg via INTRAVENOUS
  Filled 2023-07-13: qty 1

## 2023-07-13 MED ORDER — TAMSULOSIN HCL 0.4 MG PO CAPS
0.4000 mg | ORAL_CAPSULE | Freq: Every day | ORAL | Status: DC
  Administered 2023-07-13 – 2023-07-20 (×8): 0.4 mg via ORAL
  Filled 2023-07-13 (×8): qty 1

## 2023-07-13 MED ORDER — FLUTICASONE FUROATE-VILANTEROL 200-25 MCG/ACT IN AEPB
1.0000 | INHALATION_SPRAY | Freq: Every day | RESPIRATORY_TRACT | Status: DC
  Administered 2023-07-14 – 2023-08-07 (×23): 1 via RESPIRATORY_TRACT
  Filled 2023-07-13 (×4): qty 28

## 2023-07-13 MED ORDER — OXYCODONE HCL 5 MG PO TABS
15.0000 mg | ORAL_TABLET | Freq: Four times a day (QID) | ORAL | Status: DC | PRN
  Administered 2023-07-14 (×3): 15 mg via ORAL
  Filled 2023-07-13 (×3): qty 3

## 2023-07-13 MED ORDER — OXYCODONE HCL 5 MG PO TABS: 15.0000 mg | ORAL_TABLET | Freq: Once | ORAL | Status: DC

## 2023-07-13 MED ORDER — IOHEXOL 350 MG/ML SOLN
75.0000 mL | Freq: Once | INTRAVENOUS | Status: AC | PRN
  Administered 2023-07-13: 75 mL via INTRAVENOUS

## 2023-07-13 NOTE — Assessment & Plan Note (Addendum)
Quite wheezy on exam but stable on room air. Believe this is due to suboptimal  control moreso than acute exacerbation.  - Sch Duonebs scheduled q4h - Continue home Trelegy

## 2023-07-13 NOTE — Assessment & Plan Note (Signed)
Reports drinking 2-3 drinks poor day but is not a great historian. CIWA 7 overnight (though not accurate d/t having essential tremor) - CIWA  - Thiamine, folate supplementation

## 2023-07-13 NOTE — Assessment & Plan Note (Addendum)
New onset this week though does have a history of hematuria. History of MSSA UTI in 2020. Penile SCC in situ in 2014. Also had a history of urethral stricture with dilation in 2018. Daughter-in-law reports recurrent UTIs every few months in recent years which have been managed on an outpatient basis. Meeting Sepsis criteria for WBC and HR (107) with suspected urinary source. Likely contributing to his frequent falls.  - Obs patient, med-surg floor  - Blood and urine cultures collected - Rocephin 1g q24h (note cefaclor allergy, but received Rocephin in Oct 2023 without issue) - UA uninterpretable 2/2 blood - Will touch base with [facility] to follow-up with their labs and see if they have any further recs. ?utility of cystoscopy given hx of penile cancer now passing clots  - Scheduled bladder scans

## 2023-07-13 NOTE — Assessment & Plan Note (Signed)
Likely multifactorial with contributing factors including sepsis 2/2 to UTI, poor balance in the setting of reduced neck range of motion, dehydration. OT saw him and is recommending SNF. Awaiting PT eval. - PT eval - Will need to discuss with pt and family whether SNF is in-line with their goals of care. His frequent falls certainly make him high risk for fracture and further morbidity/mortality.  - Fall and delirium precautions - Weaning oxycodone - Sepsis workup as above

## 2023-07-13 NOTE — Assessment & Plan Note (Signed)
Patient denies any pain while urinating or abdominal pain, though significant hematuria seen in urine canister.  Met sepsis criteria in the ED with suspected urinary source. Discussed his case with Dr. Ronne Binning of urology who was able to find past culture reports: has grown Staph spp in the past that are cipro resistant but otherwise sensitive. He will need a cystoscopy but this can happen on an outpatient basis.  - Rocephin 1g q24h (note cefaclor allergy, but received Rocephin in Oct 2023 without issue) - Urine culture is pending  - Outpatient fu with [facility] for likely cystoscopy - Bladder scans, no evidence of retention at present

## 2023-07-13 NOTE — Assessment & Plan Note (Addendum)
Hx of COPD with moderately severe obstruction. Patient required O2 in ED, though stable on RA overnight. CT PE negative for PE but with infiltrate suggestive of bilateral lower lobe ?PNA vs atelectasis.  Will expand abx coverage to cover for CAP. Remains with some expiratory wheeze but much, much improved compared to yesterday.  - Ceftriaxone (7/26- ) and Azithromycin (7/27- ) - S/p solumedrol 125mg  with EMS yesterday, start prednisone 40mg  x4 days - Sch Duonebs scheduled q4h - Continue home Trelegy

## 2023-07-13 NOTE — Assessment & Plan Note (Signed)
S/p fall without head trauma or LOC. 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 Patient complaining of neck pain overnight  - Increased oxycodone from 10mg  q6h PRN to 15 mg q6h PRN which is his home dose - I have also scheduled Tylenol  - Lidocaine patch

## 2023-07-13 NOTE — Progress Notes (Signed)
FMTS Brief Progress Note  S: Went to bedside with Dr. Georg Ruddle.  Patient complaining of neck pain but other than that is feeling well.  Denies any dysuria or abdominal pain.   O: BP 120/70   Pulse (!) 101   Temp 98.4 F (36.9 C) (Oral)   Resp 16   Ht 5\' 7"  (1.702 m)   Wt 75.3 kg   SpO2 94%   BMI 26.00 kg/m    General: NAD, awake, alert, responsive to questions, hard of hearing Head: Normocephalic atraumatic CV: Regular rate and rhythm  Respiratory: Clear to ausculation bilaterally, chest rises symmetrically,  no increased work of breathing on room air Abdomen: Soft, non-tender, non-distended Extremities: Moves upper and lower extremities freely  A/P: Gross hematuria Does have significant hematuria seen in urine canister while we were in the room.  He denies any pain when urinating.  Denies any abdominal pain.  Did meet sepsis criteria in the ED was suspected urinary source. -Rocephin -Plans to touch base with urology tomorrow -Bladder scans (In-N-Out cath if greater than 300) if greater than 300 three times in a row please place Foley catheter which was discussed with nursing at bedside  Neck pain Still complains of continued neck pain.  They had decreased his home oxycodone to 10 mg on admission however he is still complaining of pain so we will increase back to 15 mg every 6 hours and monitor mental status and respiratory status closely.  Shortness of breath Appears comfortable on room air while we were in the room.  CT PE returned with negative pulmonary embolism.  Did show increased bronchial infection/inflammation with atelectasis in lower lobes compared to previous imaging. -Monitor respiratory status  Alcohol use Does have bilateral hand tremors on examination.  Does appear to have this is a problem listed in his chart.  He is mentating well currently but does have decreased hearing. -Monitor CIWA score closely, consider adding Ativan to this if the scores increase -  Orders reviewed. Labs for AM ordered, which was adjusted as needed.    Levin Erp, MD 07/13/2023, 10:56 PM PGY-3, [facility] Night Resident  Please page [phone] with questions.

## 2023-07-13 NOTE — ED Notes (Signed)
Pt given urinal at this time 

## 2023-07-13 NOTE — Hospital Course (Addendum)
Kent Flowers is a 77 y.o.male with a history of DISH of c spine, COPD not on oxygen, and chronic pain opioids who was admitted to the [facility] at [facility] for neck pain after fall and incidental finding of gross hematuria.   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  Per family patient has been getting UTIs every few months to be managed outpatient.  He met sepsis criteria for leukocytosis and tachycardia with suspected urinary source. Urine culture obtained prior to Abx was ultimately negative. Patient was started on Rocephin 1 g for 3 days. Additionally, he received 4 days of cefdinir 300mg . Patient received bladder scans which showed >400 mL urinary retention so he had foley placed on 7/28. Passed voiding trial on 8/1. Flomax was increased from 0.4 mg to 0.8 mg daily. Urology was consulted prior to discharge and recommended outpatient follow up for hematuria including cystoscopy and further work up.   Neck pain s/p fall Patient presents status post fall without loss of consciousness. CT without acute injury but did demonstrate chronic dens fracture and DISH of the C-spine. Patient given Lidocaine patches. Patient was continued on home Oxy 15 mg QID qith 10 mg at bedtime prn.   Acute on chronic respiratory failure with hypoxia Patient with history of COPD followed by Dr. Maple Hudson. On presentation he had significant wheezing but was stable on room air. CTPE negative for PE but did show increased bronchial infection/inflammation with increased atelectasis or infiltrates in the lower lobes compared with prior. Blood cultures remained negative. Patient given home trelegy and albuterol nebs. Patient given 3 days of Azithromycin (7/27 - 7/29) and 4 days prednisone (7/27-7/30). Ambulatory O2 test revealed no oxygen requirement and patient was discharged  without oxygen.   Recurrent Falls  Frequent falls most likely due to poor balance in the setting of reduced neck range of motion.  Patient also on several medications that could predispose to falls - Attempts were made to wean oxycodone, however patient could not tolerate pain and was returned to home oxycodone dose as above. Patient was seen by PT and OT who recommended CIR with ultimate discharge to ALF.  Insurance denied CIR and patient was ultimately transferred to SNF.  Other chronic conditions were medically managed with home medications and formulary alternatives as necessary. COPD - Trelegy, albuterol every 4 hours prn  Bilateral LE Edema - compression stockings Alcohol use - thiamine, folate supplementation  PCP Follow-up Recommendations: Close Urology follow up for hematuria, likely needs cystoscopy Patient on aspirin per neurology for microvascular changes on MRI, consider adding statin  Monitor medications and adjust for fall risk; consider weaning oxycodone if patient can tolerate CBC and BMP at follow up

## 2023-07-13 NOTE — Assessment & Plan Note (Addendum)
Reports drinking 2-3 drinks poor day but is not a great historian. ?whether etOH use may be contributing to his frequent falls. - CIWA

## 2023-07-13 NOTE — ED Provider Notes (Signed)
[place] EMERGENCY DEPARTMENT AT [facility] Provider Note   CSN: [id number] Arrival date & time: 07/13/23  4098     History Chief Complaint  Patient presents with   Shortness of Breath   Hematuria   Fall    Last night      Shortness of Breath Hematuria Associated symptoms include shortness of breath.  Fall Associated symptoms include shortness of breath.   Kent Flowers is a 77 y.o. male presenting for neck pain after fall.  Patient arrived via EMS from home.  He had a fall last night. After the fall he is reporting  worsening neck pain.  Denies lower extremity weakness, or bowel or bladder incontinence.  He also denies fevers.    Has a history of COPD, and is reporting cough and worsening short of breath.  EMS reports patient was wheezing on lung exam, he was treated with Solu-Medrol and DuoNebs.  Patient is chronic smoker and smokes 1 cigarette every day..   Patient's recorded medical, surgical, social, medication list and allergies were reviewed in the Snapshot window as part of the initial history.   Review of Systems   Review of Systems  Respiratory:  Positive for shortness of breath.   Genitourinary:  Positive for hematuria.    Physical Exam Updated Vital Signs BP 116/66   Pulse 97   Temp 98.4 F (36.9 C) (Oral)   Resp 17   Ht 5\' 7"  (1.702 m)   Wt 75.3 kg   SpO2 98%   BMI 26.00 kg/m  Physical Exam General: Pleasant, not in acute distress.   CV: RRR. No murmurs, rubs, or gallops. No LE edema Pulmonary: Bilateral wheezes. Abdominal: Soft, nontender, nondistended. Normal bowel sounds. Extremities: Palpable pulses. Normal ROM. MSK: Limited neck flexion, extension and rotation due to pain.   Skin: Warm and dry. No obvious rash or lesions. Neuro: A&Ox3. Moves all extremities. Normal sensation. No focal deficit. Psych: Normal mood and affect   ED Course/ Medical Decision Making/ A&P Clinical Course as of 07/13/23 1306  Fri Jul 13, 2023   1240 Creatinine(!): 1.50 [JK]    Clinical Course User Index [JK] Laretta Bolster, MD    Procedures Procedures   Medications Ordered in ED Medications  morphine (PF) 4 MG/ML injection 4 mg (4 mg Intravenous Given 07/13/23 0957)  oxyCODONE (Oxy IR/ROXICODONE) immediate release tablet 15 mg (15 mg Oral Given 07/13/23 1204)    Medical Decision Making:    Kent Flowers is a 77 y.o. male who presented to the ED today with neck pain after fall detailed above.     Patient placed on continuous vitals and telemetry monitoring while in ED which was reviewed periodically.   Complete initial physical exam performed, notably the patient  was stable.      Reviewed and confirmed nursing documentation for past medical history, family history, social history.    Initial Assessment:    With the patient's presentation of neck pain after a fall, most likely diagnosis is cervical spine injury. Other diagnoses were considered including spinal cord compression, or infectious etiology was considered but these are considered less likely due to history of present illness and physical exam findings.The absence of lower extremity weakness, bowel or bladder incontinence argues against spinal cord compression or myelopathy. Infections etiology also less likely because he is afebrile, and no leukocytosis on labs.  This is most consistent with an acute complicated illness  Initial Plan:  cervical spine CT with and without contrast. Screening  labs including CBC and Metabolic panel to evaluate for infectious or metabolic etiology of disease.  CXR to evaluate for structural/infectious intrathoracic pathology.  EKG to evaluate for cardiac pathology. Objective evaluation as below reviewed with plan for close reassessment  Initial Study Results:   Laboratory  All laboratory results reviewed without evidence of clinically relevant pathology.   Exceptions include: WBC 23.4  EKG EKG was reviewed independently.  Rate, rhythm, axis, intervals all examined and without medically relevant abnormality. ST segments without concerns for elevations.    Radiology  All images reviewed independently. Agree with radiology report at this time.   CT Head Wo Contrast  Result Date: 07/13/2023 CLINICAL DATA:  Head trauma, minor (Age >= 65y); Neck trauma (Age >= 65y) EXAM: CT HEAD WITHOUT CONTRAST CT CERVICAL SPINE WITHOUT CONTRAST TECHNIQUE: Multidetector CT imaging of the head and cervical spine was performed following the standard protocol without intravenous contrast. Multiplanar CT image reconstructions of the cervical spine were also generated. RADIATION DOSE REDUCTION: This exam was performed according to the departmental dose-optimization program which includes automated exposure control, adjustment of the mA and/or kV according to patient size and/or use of iterative reconstruction technique. COMPARISON:  CT Head and C Spine 03/27/23 FINDINGS: CT HEAD FINDINGS Brain: No evidence of acute infarction, hemorrhage, hydrocephalus, extra-axial collection or mass lesion/mass effect. Sequela of mild chronic microvascular ischemic change. Vascular: No hyperdense vessel or unexpected calcification. Skull: Normal. Negative for fracture or focal lesion. Sinuses/Orbits: No middle ear or mastoid effusion. Paranasal sinuses are clear. Orbits are unremarkable. Other: None. CT CERVICAL SPINE FINDINGS Alignment: Straightening of the normal cervical lordosis. Unchanged alignment of the chronic type 2 dens fracture. Skull base and vertebrae: Chronic type 2 dens fracture. 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 Soft tissues and spinal canal: No prevertebral fluid or swelling. No visible canal hematoma. Disc levels:  No CT evidence of high-grade spinal canal stenosis. Upper chest: Moderate centrilobular emphysema Other: None IMPRESSION: 1. No acute intracranial abnormality. 2. No acute fracture or traumatic malalignment of the cervical  spine. 3. Unchanged alignment of the chronic type 2 dens fracture. 4. 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 Electronically Signed   By: Lorenza Cambridge M.D.   On: 07/13/2023 11:21   CT Cervical Spine Wo Contrast  Result Date: 07/13/2023 CLINICAL DATA:  Head trauma, minor (Age >= 65y); Neck trauma (Age >= 65y) EXAM: CT HEAD WITHOUT CONTRAST CT CERVICAL SPINE WITHOUT CONTRAST TECHNIQUE: Multidetector CT imaging of the head and cervical spine was performed following the standard protocol without intravenous contrast. Multiplanar CT image reconstructions of the cervical spine were also generated. RADIATION DOSE REDUCTION: This exam was performed according to the departmental dose-optimization program which includes automated exposure control, adjustment of the mA and/or kV according to patient size and/or use of iterative reconstruction technique. COMPARISON:  CT Head and C Spine 03/27/23 FINDINGS: CT HEAD FINDINGS Brain: No evidence of acute infarction, hemorrhage, hydrocephalus, extra-axial collection or mass lesion/mass effect. Sequela of mild chronic microvascular ischemic change. Vascular: No hyperdense vessel or unexpected calcification. Skull: Normal. Negative for fracture or focal lesion. Sinuses/Orbits: No middle ear or mastoid effusion. Paranasal sinuses are clear. Orbits are unremarkable. Other: None. CT CERVICAL SPINE FINDINGS Alignment: Straightening of the normal cervical lordosis. Unchanged alignment of the chronic type 2 dens fracture. Skull base and vertebrae: Chronic type 2 dens fracture. 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 Soft tissues and spinal canal: No prevertebral fluid or  swelling. No visible canal hematoma. Disc levels:  No CT evidence of high-grade spinal canal stenosis. Upper chest: Moderate centrilobular emphysema Other: None IMPRESSION: 1. No acute intracranial abnormality. 2. No acute fracture or traumatic malalignment of the cervical spine.  3. Unchanged alignment of the chronic type 2 dens fracture. 4. 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 Electronically Signed   By: Lorenza Cambridge M.D.   On: 07/13/2023 11:21   DG Chest Portable 1 View  Result Date: 07/13/2023 CLINICAL DATA:  Shortness of breath EXAM: PORTABLE CHEST 1 VIEW COMPARISON:  03/27/2023 x-ray FINDINGS: No pneumothorax or effusion. Normal cardiopericardial silhouette. Slight prominence of the central vasculature. Interstitial prominence. No consolidation. Overlapping cardiac leads. Degenerative changes along the spine. IMPRESSION: Chronic changes.  No acute cardiopulmonary disease Electronically Signed   By: Karen Kays M.D.   On: 07/13/2023 10:32      Reassessment and Plan:   CT of cervical spine shows no evidence of acute fracture. He also has chronic type II dens fracture which is unchanged as compared to prior CT. I suspect chronic neck pain is secondary to the type II dens fracture. CT of the head shows no evidence of acute bleed.    Unclear what is driving the increasing white count.  He had Solu-Medrol en route to the ED, however would not expect such a huge jump in white count given that he did receive just one  1 dose. Chest x-ray was normal.  WBC was elevated to 23.5, he is on 1 L of oxygen,  mildly tachycardic and tachypnea.  VBG with normal pH, and CO2.  Will favor treating him for COPD exacerbation, see if he gets better vs getting CT of chest with IV contrast. (Cr 1.5, baseline is between 1.5-1.65.)  Follow-up urinalysis.   Clinical Impression:  1. Acute on chronic respiratory failure with hypoxia (HCC)   2. Strain of neck muscle, initial encounter      Data Unavailable   Final Clinical Impression(s) / ED Diagnoses Final diagnoses:  Acute on chronic respiratory failure with hypoxia (HCC)  Strain of neck muscle, initial encounter    Rx / DC Orders ED Discharge Orders     None         Laretta Bolster,  MD 07/17/23 9604    Maia Plan, MD 07/20/23 0302

## 2023-07-13 NOTE — Assessment & Plan Note (Addendum)
S/p fall without head trauma or LOC. 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  - Decreasing home oxycodone from 15mg  to 10mg  q6h PRN - Lidocaine patch

## 2023-07-13 NOTE — ED Notes (Signed)
ED TO INPATIENT HANDOFF REPORT  ED Nurse Name and Phone #: [phone]  S Name/Age/Gender Kent Flowers 77 y.o. male Room/Bed: 035C/035C  Code Status   Code Status: DNR  Home/SNF/Other Home Patient oriented to: self and place Is this baseline? Yes   Triage Complete: Triage complete  Chief Complaint COPD exacerbation (HCC) [J44.1]  Triage Note Pt arrived via ems from home for the c/o neck pain, uti symptoms, and sob. Per ems, pt was wheezing on arrival, pt states he fell earlier in the night and called ems then refused care. Pt hx of dementia per ems. Pt oriented to self and place but no time. Pt was given 125 solumedrol, 2 duoneb treatments, and approx of LR with ems. Only complain per pt is neck pain. Skin tear noted to upper right arm and blood in urine noted on bed sheets /  clothes.    Allergies Allergies  Allergen Reactions   Ceclor [Cefaclor] Rash   Sulfa Antibiotics Other (See Comments) and Rash    SEVERE RASH- childhood allergy  SEVERE RASH, Childhood allergy, Other reaction(s): Rash, SEVERE RASH- childhood allergy    Level of Care/Admitting Diagnosis ED Disposition     ED Disposition  Admit   Condition  --   Comment  Hospital Area: [facility] [[id number]]  Level of Care: Med-Surg [16]  May place patient in observation at [facility] or [facility] if equivalent level of care is available:: No  Covid Evaluation: Confirmed COVID Negative  Diagnosis: COPD exacerbation [facility]) [[id number]]  Admitting Physician: Fortunato Curling [[id number]]  Attending Physician: Westley Chandler [[id number]]          B Medical/Surgery History Past Medical History:  Diagnosis Date   Benign localized prostatic hyperplasia with lower urinary tract symptoms (LUTS)    Chronic low back pain    Common bile duct stone 11/03/2020   COPD (chronic obstructive pulmonary disease) with emphysema (HCC)    PULMOLOGIST-- DR Joni Fears YOUNG   Diskitis 05/10/2020   Dizziness  05/10/2020   Ectatic thoracic aorta (HCC)    ED (erectile dysfunction)    Fecal incontinence 03/24/2020   Full dentures    Gross hematuria    History of squamous cell carcinoma in situ    03-14-2013---  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   Wears glasses    Past Surgical History:  Procedure Laterality Date   CYSTOSCOPY WITH BIOPSY N/A 04/10/2017   Procedure: POSSIBLE BLADDER  BIOPSY;  Surgeon: Jerilee Field, MD;  Location[facility];  Service: Urology;  Laterality: N/A;   CYSTOSCOPY WITH URETHRAL DILATATION N/A 04/10/2017   Procedure: CYSTOSCOPY WITH BALLOON URETHRALSTRICTURE DILATATION;  Surgeon: Jerilee Field, MD;  Location: [facility];  Service: Urology;  Laterality: N/A;   ENDOSCOPIC RETROGRADE CHOLANGIOPANCREATOGRAPHY (ERCP) WITH PROPOFOL N/A 09/20/2022   Procedure: ENDOSCOPIC RETROGRADE  CHOLANGIOPANCREATOGRAPHY (ERCP) WITH PROPOFOL;  Surgeon: Meryl Dare, MD;  Location: [facility] ENDOSCOPY;  Service: Gastroenterology;  Laterality: N/A;   PENILE BIOPSY N/A 03/14/2013   Procedure: PENILE BIOPSY;  Surgeon: [place]y Haste, MD;  Location: [facility];  Service: Urology;  Laterality: N/A;   REATTACHMENT LEFT INDEX FINGER  1988   REMOVAL OF STONES  09/20/2022   Procedure: REMOVAL OF STONES;  Surgeon: Meryl Dare, MD;  Location: [facility] ENDOSCOPY;  Service: Gastroenterology;;    SHOULDER ARTHROSCOPY WITH ROTATOR CUFF REPAIR AND SUBACROMIAL DECOMPRESSION Left 2004   THORACIC LAMINECTOMY FOR EPIDURAL ABSCESS N/A 12/18/2019   Procedure: THORACIC LAMINECTOMY FOR EPIDURAL ABSCESS Thoracic Two, Thoracic Three;  Surgeon: Jadene Pierini, MD;  Location: [facility] OR;  Service: Neurosurgery;  Laterality: N/A;  THORACIC LAMINECTOMY FOR EPIDURAL ABSCESS Thoracic Two, Thoracic Three   TONSILLECTOMY  AS CHILD     A IV Location/Drains/Wounds Patient Lines/Drains/Airways Status     Active Line/Drains/Airways     Name Placement date Placement time Site Days   Peripheral IV 07/13/23 18 G Left Antecubital 07/13/23  --  Antecubital  less than 1            Intake/Output Last 24 hours No intake or output data in the 24 hours ending 07/13/23 1439  Labs/Imaging Results for orders placed or performed during the hospital encounter of 07/13/23 (from the past 48 hour(s))  Brain natriuretic peptide     Status: None   Collection Time: 07/13/23  9:13 AM  Result Value Ref Range   B Natriuretic Peptide 51.7 0.0 - 100.0 pg/mL    Comment: Performed at [facility], [address] [place], [place] [address]  CBC with Differential     Status: Abnormal   Collection Time: 07/13/23  9:13 AM  Result Value Ref Range   WBC 28.5 (H) 4.0 - 10.5 K/uL   RBC 4.28 4.22 - 5.81 MIL/uL   Hemoglobin 12.9 (L) 13.0 - 17.0 g/dL   HCT 78.4 69.6 - 29.5 %   MCV 94.4 80.0 - 100.0 fL   MCH 30.1 26.0 - 34.0 pg   MCHC 31.9 30.0 - 36.0 g/dL   RDW 28.4 13.2 - 44.0 %   Platelets 292 150 - 400 K/uL   nRBC 0.0 0.0 - 0.2 %   Neutrophils Relative % 90 %   Neutro Abs 25.6 (H) 1.7 - 7.7 K/uL   Lymphocytes Relative 5 %   Lymphs Abs 1.5 0.7 - 4.0 K/uL   Monocytes Relative 4 %   Monocytes Absolute 1.1 (H) 0.1 - 1.0 K/uL   Eosinophils Relative 0 %   Eosinophils Absolute 0.0 0.0 - 0.5 K/uL   Basophils Relative 0 %   Basophils Absolute 0.0 0.0 - 0.1 K/uL   Immature Granulocytes 1 %   Abs Immature  Granulocytes 0.21 (H) 0.00 - 0.07 K/uL    Comment: Performed at [facility], [address], [place], [place] [address]  SARS Coronavirus 2 by RT PCR (hospital order, performed in [facility] hospital lab) *cepheid single result test* Anterior Nasal Swab     Status: None   Collection Time: 07/13/23  9:50 AM   Specimen: Anterior Nasal Swab  Result Value Ref Range   SARS Coronavirus 2 by RT PCR NEGATIVE NEGATIVE    Comment: Performed at [facility], [address] [place], [place] [address]  I-Stat venous blood gas, ED     Status: Abnormal   Collection Time: 07/13/23  10:01 AM  Result Value Ref Range   pH, Ven 7.403 7.25 - 7.43   pCO2, Ven 41.2 (L) 44 - 60 mmHg   pO2, Ven 130 (H) 32 - 45 mmHg   Bicarbonate 25.7 20.0 - 28.0 mmol/L   TCO2 27 22 - 32 mmol/L   O2 Saturation 99 %   Acid-Base Excess 1.0 0.0 - 2.0 mmol/L   Sodium 137 135 - 145 mmol/L   Potassium 4.7 3.5 - 5.1 mmol/L   Calcium, Ion 1.01 (L) 1.15 - 1.40 mmol/L   HCT 41.0 39.0 - 52.0 %   Hemoglobin 13.9 13.0 - 17.0 g/dL   Sample type VENOUS   Comprehensive metabolic panel     Status: Abnormal   Collection Time: 07/13/23 11:19 AM  Result Value Ref Range   Sodium 138 135 - 145 mmol/L   Potassium 4.1 3.5 - 5.1 mmol/L   Chloride 105 98 - 111 mmol/L   CO2 22 22 - 32 mmol/L   Glucose, Bld 166 (H) 70 - 99 mg/dL    Comment: Glucose reference range applies only to samples taken after fasting for at least 8 hours.   BUN 23 8 - 23 mg/dL   Creatinine, Ser 0.27 (H) 0.61 - 1.24 mg/dL   Calcium 7.8 (L) 8.9 - 10.3 mg/dL   Total Protein 6.0 (L) 6.5 - 8.1 g/dL   Albumin 3.3 (L) 3.5 - 5.0 g/dL   AST 26 15 - 41 U/L   ALT 16 0 - 44 U/L   Alkaline Phosphatase 71 38 - 126 U/L   Total Bilirubin 1.1 0.3 - 1.2 mg/dL   GFR, Estimated 48 (L) >60 mL/min    Comment: (NOTE) Calculated using the CKD-EPI Creatinine Equation (2021)    Anion gap 11 5 - 15    Comment: Performed at [facility], [address] [place], [place]  [address]  Troponin I (High Sensitivity)     Status: None   Collection Time: 07/13/23 11:19 AM  Result Value Ref Range   Troponin I (High Sensitivity) 16 <18 ng/L    Comment: (NOTE) Elevated high sensitivity troponin I (hsTnI) values and significant  changes across serial measurements may suggest ACS but many other  chronic and acute conditions are known to elevate hsTnI results.  Refer to the "Links" section for chest pain algorithms and additional  guidance. Performed at [facility], [address] [place], [place] [address]   Urinalysis, Routine w reflex microscopic -Urine, Clean Catch     Status: Abnormal   Collection Time: 07/13/23  1:20 PM  Result Value Ref Range   Color, Urine RED (A) YELLOW    Comment: BIOCHEMICALS MAY BE AFFECTED BY COLOR MICROSCOPIC EXAM PERFORMED ON UNCONCENTRATED URINE    APPearance TURBID (A) CLEAR   Specific Gravity, Urine  1.005 - 1.030    TEST NOT REPORTED DUE TO COLOR INTERFERENCE OF URINE PIGMENT   pH  5.0 - 8.0    TEST NOT REPORTED DUE TO COLOR INTERFERENCE OF URINE PIGMENT   Glucose, UA (A) NEGATIVE mg/dL    TEST NOT REPORTED DUE TO COLOR INTERFERENCE OF URINE PIGMENT   Hgb urine dipstick (A) NEGATIVE    TEST NOT REPORTED DUE TO COLOR INTERFERENCE OF URINE PIGMENT   Bilirubin Urine (A) NEGATIVE    TEST NOT REPORTED DUE TO COLOR INTERFERENCE OF URINE PIGMENT   Ketones, ur (A) NEGATIVE mg/dL    TEST NOT REPORTED DUE TO COLOR INTERFERENCE OF URINE PIGMENT  Protein, ur (A) NEGATIVE mg/dL    TEST NOT REPORTED DUE TO COLOR INTERFERENCE OF URINE PIGMENT   Nitrite (A) NEGATIVE    TEST NOT REPORTED DUE TO COLOR INTERFERENCE OF URINE PIGMENT   Leukocytes,Ua (A) NEGATIVE    TEST NOT REPORTED DUE TO COLOR INTERFERENCE OF URINE PIGMENT    Comment: Performed at [facility], [address] [place], [place] [address]  Urinalysis, Microscopic (reflex)     Status: Abnormal   Collection Time: 07/13/23  1:20 PM  Result Value Ref Range   RBC /  HPF >50 0 - 5 RBC/hpf   WBC, UA 11-20 0 - 5 WBC/hpf   Bacteria, UA FEW (A) NONE SEEN   Squamous Epithelial / HPF 0-5 0 - 5 /HPF    Comment: Performed at [facility], [address], [place], [place] [address]   CT Head Wo Contrast  Result Date: 07/13/2023 CLINICAL DATA:  Head trauma, minor (Age >= 65y); Neck trauma (Age >= 65y) EXAM: CT HEAD WITHOUT CONTRAST CT CERVICAL SPINE WITHOUT CONTRAST TECHNIQUE: Multidetector CT imaging of the head and cervical spine was performed following the standard protocol without intravenous contrast. Multiplanar CT image reconstructions of the cervical spine were also generated. RADIATION DOSE REDUCTION: This exam was performed according to the departmental dose-optimization program which includes automated exposure control, adjustment of the mA and/or kV according to patient size and/or use of iterative reconstruction technique. COMPARISON:  CT Head and C Spine 03/27/23 FINDINGS: CT HEAD FINDINGS Brain: No evidence of acute infarction, hemorrhage, hydrocephalus, extra-axial collection or mass lesion/mass effect. Sequela of mild chronic microvascular ischemic change. Vascular: No hyperdense vessel or unexpected calcification. Skull: Normal. Negative for fracture or focal lesion. Sinuses/Orbits: No middle ear or mastoid effusion. Paranasal sinuses are clear. Orbits are unremarkable. Other: None. CT CERVICAL SPINE FINDINGS Alignment: Straightening of the normal cervical lordosis. Unchanged alignment of the chronic type 2 dens fracture. Skull base and vertebrae: Chronic type 2 dens fracture. 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 Soft tissues and spinal canal: No prevertebral fluid or swelling. No visible canal hematoma. Disc levels:  No CT evidence of high-grade spinal canal stenosis. Upper chest: Moderate centrilobular emphysema Other: None IMPRESSION: 1. No acute intracranial abnormality. 2. No acute fracture or traumatic malalignment of the cervical spine. 3.  Unchanged alignment of the chronic type 2 dens fracture. 4. 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 Electronically Signed   By: Lorenza Cambridge M.D.   On: 07/13/2023 11:21   CT Cervical Spine Wo Contrast  Result Date: 07/13/2023 CLINICAL DATA:  Head trauma, minor (Age >= 65y); Neck trauma (Age >= 65y) EXAM: CT HEAD WITHOUT CONTRAST CT CERVICAL SPINE WITHOUT CONTRAST TECHNIQUE: Multidetector CT imaging of the head and cervical spine was performed following the standard protocol without intravenous contrast. Multiplanar CT image reconstructions of the cervical spine were also generated. RADIATION DOSE REDUCTION: This exam was performed according to the departmental dose-optimization program which includes automated exposure control, adjustment of the mA and/or kV according to patient size and/or use of iterative reconstruction technique. COMPARISON:  CT Head and C Spine 03/27/23 FINDINGS: CT HEAD FINDINGS Brain: No evidence of acute infarction, hemorrhage, hydrocephalus, extra-axial collection or mass lesion/mass effect. Sequela of mild chronic microvascular ischemic change. Vascular: No hyperdense vessel or unexpected calcification. Skull: Normal. Negative for fracture or focal lesion. Sinuses/Orbits: No middle ear or mastoid effusion. Paranasal sinuses are clear. Orbits are unremarkable. Other: None. CT  CERVICAL SPINE FINDINGS Alignment: Straightening of the normal cervical lordosis. Unchanged alignment of the chronic type 2 dens fracture. Skull base and vertebrae: Chronic type 2 dens fracture. 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 Soft tissues and spinal canal: No prevertebral fluid or swelling. No visible canal hematoma. Disc levels:  No CT evidence of high-grade spinal canal stenosis. Upper chest: Moderate centrilobular emphysema Other: None IMPRESSION: 1. No acute intracranial abnormality. 2. No acute fracture or traumatic malalignment of the cervical spine. 3.  Unchanged alignment of the chronic type 2 dens fracture. 4. 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 Electronically Signed   By: Lorenza Cambridge M.D.   On: 07/13/2023 11:21   DG Chest Portable 1 View  Result Date: 07/13/2023 CLINICAL DATA:  Shortness of breath EXAM: PORTABLE CHEST 1 VIEW COMPARISON:  03/27/2023 x-ray FINDINGS: No pneumothorax or effusion. Normal cardiopericardial silhouette. Slight prominence of the central vasculature. Interstitial prominence. No consolidation. Overlapping cardiac leads. Degenerative changes along the spine. IMPRESSION: Chronic changes.  No acute cardiopulmonary disease Electronically Signed   By: Karen Kays M.D.   On: 07/13/2023 10:32    Pending Labs Unresulted Labs (From admission, onward)     Start     Ordered   07/14/23 0500  Basic metabolic panel  Tomorrow morning,   R        07/13/23 1416   07/14/23 0500  CBC  Tomorrow morning,   R        07/13/23 1416   07/14/23 0500  Hemoglobin A1c  Tomorrow morning,   R        07/13/23 1416   07/13/23 1411  Urine Culture (for pregnant, neutropenic or urologic patients or patients with an indwelling urinary catheter)  (Urine Culture)  Add-on,   AD       Question:  Indication  Answer:  Acute gross hematuria   07/13/23 1416   07/13/23 1400  Culture, blood (Routine X 2) w Reflex to ID Panel  BLOOD CULTURE X 2,   R (with TIMED occurrences)      07/13/23 1400            Vitals/Pain Today's Vitals   07/13/23 1030 07/13/23 1300 07/13/23 1306 07/13/23 1417  BP: 126/72 116/66    Pulse: (!) 102 97    Resp: 20 17    Temp:   98.4 F (36.9 C)   TempSrc:   Oral   SpO2: 99% 98%    Weight:      Height:      PainSc:    6     Isolation Precautions No active isolations  Medications Medications  ipratropium-albuterol (DUONEB) 0.5-2.5 (3) MG/3ML nebulizer solution 3 mL (has no administration in time range)  aspirin EC tablet 81 mg (has no administration in time range)  oxyCODONE  (Oxy IR/ROXICODONE) immediate release tablet 10 mg (has no administration in time range)  QUEtiapine (SEROQUEL) tablet 25 mg (has no administration in time range)  donepezil (ARICEPT) tablet 10 mg (has no administration in time range)  tamsulosin (FLOMAX) capsule 0.4 mg (has no administration in time range)  cyanocobalamin (VITAMIN B12) tablet 1,000 mcg (has no administration in time range)  fluticasone furoate-vilanterol (BREO ELLIPTA) 200-25 MCG/ACT 1 puff (has no administration in time range)    And  umeclidinium bromide (INCRUSE ELLIPTA) 62.5 MCG/ACT 1 puff (has no administration in time range)  folic acid (FOLVITE) tablet 1 mg (has no administration in time range)  multivitamin with  minerals tablet 1 tablet (has no administration in time range)  thiamine (VITAMIN B1) tablet 100 mg (has no administration in time range)  morphine (PF) 4 MG/ML injection 4 mg (4 mg Intravenous Given 07/13/23 0957)  oxyCODONE (Oxy IR/ROXICODONE) immediate release tablet 15 mg (15 mg Oral Given 07/13/23 1204)    Mobility walks with device     Focused Assessments Pulmonary Assessment Handoff:  Lung sounds:   O2 Device: Room Air O2 Flow Rate (L/min): 2 L/min    R Recommendations: See Admitting Provider Note  Report given to:   Additional Notes:

## 2023-07-13 NOTE — Assessment & Plan Note (Addendum)
Broad differential includes sepsis 2/2 UTI vs etOH use + oxycodone vs generalized weakness and decline in the setting of his Alzheimers Disease. Likely multifactorial. - PT/OT - Fall and delirium precautions

## 2023-07-13 NOTE — H&P (Addendum)
Hospital Admission History and Physical Service Pager: [phone]  Patient name: [facility] Medical record number: [id number] Date of Birth: 09/12/1946 Age: 77 y.o. Gender: male  Primary Care Provider: Earnest Rosier, MD Consultants: Urology Code Status: DNR/DNI Preferred Emergency Contact:  Contact Information     Name Relation Home Work Mobile   [place] Son   [phone]   [place] Daughter   [phone]   Addiel, [place] Daughter   [phone]      Other Contacts   None on File     Chief Complaint: neck pain/SOB/hematuria  Assessment and Plan: FOUNT FUGERE is a 77 y.o. male with a past medical history of recurrent falls, penile squamous cell carcinoma, Alzheimer's Dementia, COPD, pulmonary nodules, thoracic AAA presenting with neck pain and shortness of breath status post fall yesterday. Differential for presentation of this includes sepsis secondary to UTI, generalized deconditioning, recurrent falls due to alcohol use, COPD exacerbation, PE, acute viral illness, pneumonia, pneumothorax. Most likely to be sepsis secondary to UTI due to his gross hematuria over the last week. Unlikely to be pneumonia vs acute viral illness or pneumothorax due to no symptoms at this time consistent with viral illness, chronic wheezing, and imaging shows no signs of pneumothorax or pneumonia. Will watch for signs of alcohol withdrawal with his alcohol use and precautions on board due to his history of Alzheimer's.  [facility]     * (Principal) Gross hematuria     New onset this week though does have a history of hematuria. History of  MSSA UTI in 2020. Penile SCC in situ in 2014. Also had a history of  urethral stricture with dilation in 2018. Daughter-in-law reports  recurrent UTIs every few months in recent years which have been managed on  an outpatient basis. Meeting Sepsis criteria for WBC and HR (107) with  suspected urinary source. Likely contributing  to his frequent falls.  - Obs patient, med-surg floor  - Blood and urine cultures collected - Rocephin 1g q24h (note cefaclor allergy, but received Rocephin in Oct  2023 without issue) - UA uninterpretable 2/2 blood - Will touch base with [facility] to follow-up their culture and see  if they have any further recs. ?cystoscopy this hospitalization given hx  of penile cancer now passing clots  - Scheduled bladder scans         Neck pain     S/p fall without head trauma or LOC. 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  - Decreasing home oxycodone from 15mg  to 10mg  q6h PRN - Lidocaine patch         Shortness of breath     COPD patient, follows with Dr. Maple Hudson, had spirometry in 2018 with  moderately severe obstruction. FVC 3.25/79%, FEV1 1.77/58%, ratio 0.54,  FEF 25-75% 0.88/38%. Quite wheezy on exam but stable on room air. Believe  this is due to suboptimal COPD control moreso than acute exacerbation.  However, given reported shortness of breath, brief O2 requirement in the  ED, and tachycardia with asymmetric leg swelling, will rule out PE. - CTA PE study  - Sch Duonebs scheduled q4h - Continue home Trelegy         Recurrent falls     Broad differential includes sepsis 2/2 UTI vs etOH use + oxycodone vs  generalized weakness and decline in the setting of his Alzheimers Disease.  Likely multifactorial. - PT/OT -  Fall and delirium precautions - Weaning oxycodone - Sepsis workup as above         Alcohol use     Reports drinking 2-3 drinks poor day but is not a great historian.  ?whether etOH use may be contributing to his frequent falls. - CIWA  - Thiamine, folate supplementation        Bilateral leg edema     Skin changes suggestive of chronic venous stasis changes without  evidence of superimposed infection. - Will need compression stockings        FEN/GI: Regular Diet VTE Prophylaxis: SCDs for now, will  monitor bloody output and consider transition to LMWH tomorrow   Disposition: Med-Surg  History of Present Illness:  Kent Flowers is a 77 y.o. male presenting with neck pain, shortness of breath status post fall last night.  Patient endorses frequent falls which he attributes to "losing his balance". Drinks 2-3 drinks daily, but not sure if he had alcohol with his most recent fall. 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 Denies any lower extremity weakness or bowel or bladder incontinence.    Caretaker and son share that he has been having hematuria for over a week and was passing blood clots yesterday and he was taken to [facility] yesterday where they got a UA and culture which is pending. They believe it may be an infection due to his history of penile squamous cell cancer s/p surgery. They don't believe this is cancer at this time, but will confirm with cultures. One day of Macrobid (Day 1/7).   In the ED required a new oxygen requirement as of late, but seems to have resolved after Solu-Medrol and Duo Nebs in the ED and is on room air and saturating in the upper 90s-100. Patient reports that he has not been using his Trelegy at home and has been using albuterol at home.   Endorses minimal lower leg swelling and skin discoloration likely due to chronic venous stasis. He also has some skin scrapes on his right upper arm. Denies headache, blurry vision, fevers, chills, chest pain, SOB, abdominal pain, nausea, vomiting, diarrhea, or lower extremity pain.   EMS picked him up and reports that he was wheezing on exam and having severe shortness of breath, treated with Solu-Medrol and DuoNebs. 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 Labs showed leukocytosis 28.5, hemoglobin 12.9 which seems lower than his  baseline, neutrophils 25.6, creatinine 1.5, BUN 23, glucose 166 and  troponin 16.  Patient was also given morphine and oxycodone in the ED.  Review Of Systems: Per HPI with the following additions: as above  Pertinent Past Medical History: COPD Pulmonary nodules OA Thoracic AAA Urinary and fecal incontinence Remainder reviewed in history tab.   Pertinent Past Surgical History: ERCP Shoulder arthroscopy with rotator cuff repair and subacromial decompression  Remainder reviewed in history tab.  Pertinent Social History: Tobacco use: Yes - one cigarette per day, quit previously  Alcohol use: 2-3 drinks most days  Other Substance use: none Lives with alone and has caretaker till about 5 pm  Pertinent Family History: Mother: Emphysema Father: Cancer  Remainder reviewed in history tab.   Important Outpatient Medications: Albuterol 108 mcg - 2 puffs every 6 hours Aspirin 81 mg daily Vitamin B12 1000 mcg daily  Donepezil 10 mg at bedtime Oxycodone 15 mg 5 times daily as needed for pain mild Seroquel 25 mg twice daily Flomax 0.4 mg at bedtime Testosterone cypionate 200 mg/ml - inject 180 mg into muscle every 14 days Trelegy Ellipta 200-62.5-25 MCG/ACT - 1 puff daily (not taking)  Remainder reviewed in medication history.   Objective: BP 116/66   Pulse 97   Temp 98.4 F (36.9 C) (Oral)   Resp 17   Ht 5\' 7"  (1.702 m)   Wt 75.3 kg   SpO2 98%   BMI 26.00 kg/m  Exam: Exam per Dr. Marisue Humble: General: Chronically ill appearing but in good spirits, NAD ENTM: MMM, hard of hearing, poor dentition  Neck: No spinal or paraspinal deformity or tenderness, no LAD. ROM is limited by pain  Cardiovascular: Regular rate and rhythm without murmur, good peripheral pulses.  Respiratory: Significantly prolonged expiratory phase, speaking in full sentences. Inspiratory and expiratory wheeze in all fields.  Gastrointestinal: Abdomen is non-tender and non-distended GU: Normal penis,  circumcised. Without blood or stenosis at the meatus.  MSK: There is L>R non-pitting LE edema.  Derm: Chronic venous stasis changes to the bilateral legs, skin tears to the R arm that have been bandaged   Neuro: Pleasant, confused at times, but A&O x3 (knows the year but not the month) Psych: Mood and affect are appropriate to the situation   Labs:  CBC BMET  Recent Labs  Lab 07/13/23 0913 07/13/23 1001  WBC 28.5*  --   HGB 12.9* 13.9  HCT 40.4 41.0  PLT 292  --    Recent Labs  Lab 07/13/23 1119  NA 138  K 4.1  CL 105  CO2 22  BUN 23  CREATININE 1.50*  GLUCOSE 166*  CALCIUM 7.8*     EKG: Sinus tachycardia, L axis deviation, q wave in III and V1. These are present in past EKG as well.    Imaging Studies Performed: CXR: Chronic changes. No acute cardiopulmonary disease   CT Head w/o contrast:  1. No acute intracranial abnormality. 2. No acute fracture or traumatic malalignment of the cervical spine. 3. Unchanged alignment of the chronic type 2 dens fracture. 4. 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 No acute intracranial abnormality. 2. No acute fracture or traumatic malalignment of the cervical spine. 3. Unchanged alignment of the chronic type 2 dens fracture. 4. 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 Fatima Blank and reviewed the above note, making necessary revisions.  Dorothyann Gibbs, MD 07/13/2023, 3:44 PM PGY-3, [facility]

## 2023-07-13 NOTE — ED Triage Notes (Signed)
Pt arrived via ems from home for the c/o neck pain, uti symptoms, and sob. Per ems, pt was wheezing on arrival, pt states he fell earlier in the night and called ems then refused care. Pt hx of dementia per ems. Pt oriented to self and place but no time. Pt was given 125 solumedrol, 2 duoneb treatments, and approx of LR with ems. Only complain per pt is neck pain. Skin tear noted to upper right arm and blood in urine noted on bed sheets /  clothes.

## 2023-07-13 NOTE — Assessment & Plan Note (Addendum)
Skin changes suggestive of chronic venous stasis changes without evidence of superimposed infection. - Will need compression stockings

## 2023-07-13 NOTE — ED Notes (Signed)
Bladder scan complete 130 ML found

## 2023-07-14 DIAGNOSIS — J9621 Acute and chronic respiratory failure with hypoxia: Principal | ICD-10-CM

## 2023-07-14 LAB — GLUCOSE, CAPILLARY: Glucose-Capillary: 122 mg/dL — ABNORMAL HIGH (ref 70–99)

## 2023-07-14 LAB — LACTIC ACID, PLASMA: Lactic Acid, Venous: 0.9 mmol/L (ref 0.5–1.9)

## 2023-07-14 MED ORDER — AZITHROMYCIN 250 MG PO TABS
500.0000 mg | ORAL_TABLET | Freq: Every day | ORAL | Status: AC
  Administered 2023-07-14 – 2023-07-16 (×3): 500 mg via ORAL
  Filled 2023-07-14 (×3): qty 2

## 2023-07-14 MED ORDER — OXYCODONE HCL 5 MG PO TABS
15.0000 mg | ORAL_TABLET | ORAL | Status: DC | PRN
  Administered 2023-07-14 – 2023-07-15 (×3): 15 mg via ORAL
  Filled 2023-07-14 (×3): qty 3

## 2023-07-14 MED ORDER — ENOXAPARIN SODIUM 40 MG/0.4ML IJ SOSY
40.0000 mg | PREFILLED_SYRINGE | INTRAMUSCULAR | Status: DC
  Administered 2023-07-14: 40 mg via SUBCUTANEOUS
  Filled 2023-07-14: qty 0.4

## 2023-07-14 MED ORDER — PREDNISONE 20 MG PO TABS
40.0000 mg | ORAL_TABLET | Freq: Every day | ORAL | Status: AC
  Administered 2023-07-14 – 2023-07-17 (×4): 40 mg via ORAL
  Filled 2023-07-14 (×4): qty 2

## 2023-07-14 MED ORDER — ACETAMINOPHEN 325 MG PO TABS
650.0000 mg | ORAL_TABLET | Freq: Four times a day (QID) | ORAL | Status: DC
  Administered 2023-07-14 – 2023-08-07 (×90): 650 mg via ORAL
  Filled 2023-07-14 (×90): qty 2

## 2023-07-14 MED ORDER — IPRATROPIUM-ALBUTEROL 0.5-2.5 (3) MG/3ML IN SOLN: 3.0000 mL | RESPIRATORY_TRACT | Status: DC

## 2023-07-14 MED ORDER — ALBUTEROL SULFATE (2.5 MG/3ML) 0.083% IN NEBU: 2.5000 mg | INHALATION_SOLUTION | RESPIRATORY_TRACT | Status: DC

## 2023-07-14 MED ORDER — ALBUTEROL SULFATE (2.5 MG/3ML) 0.083% IN NEBU
2.5000 mg | INHALATION_SOLUTION | RESPIRATORY_TRACT | Status: DC
  Filled 2023-07-14: qty 3

## 2023-07-14 MED ORDER — IPRATROPIUM-ALBUTEROL 0.5-2.5 (3) MG/3ML IN SOLN: 3.0000 mL | Freq: Two times a day (BID) | RESPIRATORY_TRACT | Status: DC

## 2023-07-14 MED ORDER — HYDROXYZINE HCL 25 MG PO TABS
25.0000 mg | ORAL_TABLET | Freq: Three times a day (TID) | ORAL | Status: DC | PRN
  Administered 2023-07-14 – 2023-08-04 (×7): 25 mg via ORAL
  Filled 2023-07-14 (×7): qty 1

## 2023-07-14 MED ORDER — ALBUTEROL SULFATE HFA 108 (90 BASE) MCG/ACT IN AERS
1.0000 | INHALATION_SPRAY | RESPIRATORY_TRACT | Status: DC
  Administered 2023-07-14: 1 via RESPIRATORY_TRACT
  Filled 2023-07-14: qty 6.7

## 2023-07-14 NOTE — Evaluation (Addendum)
Occupational Therapy Evaluation Patient Details Name: Kent Flowers MRN: [id number] DOB: 01/18/1946 Today's Date: 07/14/2023   History of Present Illness 77 y.o. male admitted on 07/13/23 for falls, acute hpoxemia, sepsis, and elevated troponin.  Pt dx with UTI (with hematuria) and possible PNA.  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  Pt with significant PMH of falls, COPD, thoracic laminectomy, L shoulder RTC repair and decompression (2004).   Clinical Impression   Pt admitted for above dx, PTA pt lived alone but had a PCA 5 days/wk for 4hrs/day, reports ind in bADLs but has assist with iADLs. Pt presenting with impaired activity tolerance with DOE and displays a need for increased 02 demands with functional activity, pt with impaired balance needing Mod A for functional transfers. He exhibits a FOF and needs reassurance but he loses his balance posteriorly with ambulation. Discussed with pt and son the need for rehab if not able to procure 24/7 care. Pt would benefit from continued acute skilled OT services to address deficits and help transition to next level of care. Patient has the potential to reach Mod I and demos the ability to tolerate 3 hours of therapy. Pt would benefit from an intensive rehab program to help maximize functional independence.      Recommendations for follow up therapy are one component of a multi-disciplinary discharge planning process, led by the attending physician.  Recommendations may be updated based on patient status, additional functional criteria and insurance authorization.   Assistance Recommended at Discharge Frequent or constant Supervision/Assistance  Patient can return home with the following A lot of help with walking and/or transfers;A lot of help with bathing/dressing/bathroom;Assistance with cooking/housework;Help with stairs or ramp for entrance;Assist for  transportation    Functional Status Assessment  Patient has had a recent decline in their functional status and demonstrates the ability to make significant improvements in function in a reasonable and predictable amount of time.  Equipment Recommendations  None recommended by OT (TBD at next level of care)    Recommendations for Other Services Rehab consult     Precautions / Restrictions Precautions Precautions: Fall;Other (comment) Precaution Comments: monitor O2 sats, ambulatory O2 may be needed      Mobility Bed Mobility Overal bed mobility: Needs Assistance Bed Mobility: Supine to Sit     Supine to sit: Mod assist, HOB elevated     General bed mobility comments: Pt with fear of falling, needing mod A with use of pads to scoot forward. Assist with trunk to sit EOB. Pt left sitting in recliner    Transfers Overall transfer level: Needs assistance Equipment used: Rolling walker (2 wheels) Transfers: Sit to/from Stand Sit to Stand: Min guard           General transfer comment: Fear of falling      Balance Overall balance assessment: Needs assistance Sitting-balance support: Feet supported Sitting balance-Leahy Scale: Fair Sitting balance - Comments: fair to poor, slightly restless at EOB due to fear   Standing balance support: During functional activity, Bilateral upper extremity supported Standing balance-Leahy Scale: Poor Standing balance comment: Needs RW support and help with positioning R hand on walker. Mod A for balance support                           ADL either performed or assessed with clinical judgement   ADL  Vision         Perception     Praxis      Pertinent Vitals/Pain       Hand Dominance Left   Extremity/Trunk Assessment Upper Extremity Assessment Upper Extremity Assessment: Defer to OT evaluation   Lower Extremity Assessment Lower Extremity  Assessment: RLE deficits/detail;LLE deficits/detail RLE Deficits / Details: bil LEs with generalized weakness, grossly 3/5 per seated MMT, pt has scrapes on bil lower legs likely from falls. LLE Deficits / Details: bil LEs with generalized weakness, grossly 3/5 per seated MMT, pt has scrapes on bil lower legs likely from falls.   Cervical / Trunk Assessment Cervical / Trunk Assessment: Kyphotic;Other exceptions;Back Surgery Cervical / Trunk Exceptions: forward head   Communication Communication Communication: HOH   Cognition Arousal/Alertness: Awake/alert Behavior During Therapy: WFL for tasks assessed/performed Overall Cognitive Status: No family/caregiver present to determine baseline cognitive functioning                                 General Comments: Pt with hx of dementia at baseline, overall cognition WFL today. demonstrates strong FOF     General Comments  O2 sats with gait are 87% on RA with DOE 3/4, O2 sats quickly rebounded once seated to 94% on RA.  I spent a significant amout of time speaking with the pt and his family about all of the different discharge avenues.  The patient would prefer to go home.    Exercises     Shoulder Instructions      Home Living Family/patient expects to be discharged to:: Private residence Living Arrangements: Alone Available Help at Discharge: Family;Available PRN/intermittently;Personal care attendant (son confirms 4 hours per day 7 days per week) Type of Home: House Home Access: Stairs to enter [employer] of Steps: 4 Entrance Stairs-Rails: Left Home Layout: One level     Bathroom Shower/Tub: Producer, television/film/video: Handicapped height Bathroom Accessibility: Yes   Home Equipment: Agricultural consultant (2 wheels);Shower seat;Grab bars - tub/shower;BSC/3in1          Prior Functioning/Environment Prior Level of Function : Needs assist;History of Falls (last six months)       Physical Assist :  Mobility (physical) Mobility (physical): Bed mobility   Mobility Comments: has difficulty with high bed lifting both legs back into bed, has a bedrail. ADLs Comments: reports indep with use of shower seat, grab bars. Ind with mediactions, has assist with cooking        OT Problem List: Impaired balance (sitting and/or standing);Decreased strength;Decreased activity tolerance      OT Treatment/Interventions: Self-care/ADL training;Energy conservation;Balance training;Therapeutic activities;Patient/family education    OT Goals(Current goals can be found in the care plan section)    OT Frequency: Min 1X/week    Co-evaluation              AM-PAC OT "6 Clicks" Daily Activity     Outcome Measure Help from another person eating meals?: A Little Help from another person taking care of personal grooming?: A Little Help from another person toileting, which includes using toliet, bedpan, or urinal?: A Lot Help from another person bathing (including washing, rinsing, drying)?: A Lot Help from another person to put on and taking off regular upper body clothing?: A Lot Help from another person to put on and taking off regular lower body clothing?: A Lot 6 Click Score: 14   End of Session Equipment Utilized During Treatment:  Gait belt;Rolling walker (2 wheels) Nurse Communication: Mobility status (stand pivot Mod A)  Activity Tolerance: Patient tolerated treatment well Patient left: in chair;with chair alarm set;with call bell/phone within reach  OT Visit Diagnosis: Unsteadiness on feet (R26.81);Other abnormalities of gait and mobility (R26.89);History of falling (Z91.81);Muscle weakness (generalized) (M62.81)                Time:  -    Charges:     07/14/2023  AB, OTR/L  Acute Rehabilitation Services  Office: [phone]   Tristan Schroeder 07/14/2023, 6:16 PM

## 2023-07-14 NOTE — Progress Notes (Signed)
Daily Progress Note Intern Pager: [phone]  Patient name: Kent Flowers [facility] Medical record number: [id number] Date of birth: 01/05/1946 Age: 77 y.o. Gender: male  Primary Care Provider: Earnest Rosier, MD Consultants: Urology  Code Status: DNR  Pt Overview and Major Events to Date:  7/26- admitted   Assessment and Plan: Kent Flowers is a 77 yo M with PMHx of DISH of C spine, COPD not on home oxygen, and chronic pain with narcotic pain medications who was admitted after a fall w neck pain, increased WOB with increased sputum production, and gross hematuria.   [facility]     * (Principal) Recurrent falls     Likely multifactorial with contributing factors including sepsis 2/2 to  UTI, poor balance in the setting of reduced neck range of motion,  dehydration. OT saw him and is recommending SNF. Awaiting PT eval. - PT eval - Will need to discuss with pt and family whether SNF is in-line with  their goals of care. His frequent falls certainly make him high risk for  fracture and further morbidity/mortality.  - Fall and delirium precautions - Weaning oxycodone - Sepsis workup as above         Neck pain     S/p fall without head trauma or LOC. 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 Patient  complaining of neck pain overnight  - Increased oxycodone from 10mg  q6h PRN to 15 mg q6h PRN which is his home  dose - I have also scheduled Tylenol  - Lidocaine patch         Shortness of breath     Hx of COPD with moderately severe obstruction. Patient required O2 in  ED, though stable on RA overnight. CT PE negative for PE but with  infiltrate suggestive of bilateral lower lobe ?PNA vs atelectasis.  Will  expand abx coverage to cover for CAP. Remains with some expiratory wheeze  but much, much improved compared to yesterday.  - Ceftriaxone (7/26- ) and Azithromycin (7/27- ) - S/p solumedrol 125mg  with EMS yesterday, start  prednisone 40mg  x4 days - Sch Duonebs scheduled q4h - Continue home Trelegy          Alcohol use     Reports drinking 2-3 drinks poor day but is not a great historian. CIWA  7 overnight (though not accurate d/t having essential tremor) - CIWA  - Thiamine, folate supplementation        Gross hematuria     Patient denies any pain while urinating or abdominal pain, though  significant hematuria seen in urine canister.  Met sepsis criteria in the  ED with suspected urinary source. Discussed his case with Dr. Ronne Binning of  urology who was able to find past culture reports: has grown Staph spp in  the past that are cipro resistant but otherwise sensitive. He will need a  cystoscopy but this can happen on an outpatient basis.  - Rocephin 1g q24h (note cefaclor allergy, but received Rocephin in Oct  2023 without issue) - Urine culture is pending  - Outpatient fu with [facility] for likely cystoscopy - Bladder scans, no evidence of retention at present         Bilateral leg edema     Skin changes suggestive of chronic venous stasis changes without  evidence of superimposed infection. TED hose ordered  - Will need compression stockings  Acute on chronic respiratory failure with hypoxia (HCC)    FEN/GI: Regular diet  PPx: Lovenox  Dispo:pending clinical improvement   Subjective:  Feels well this morning. Better than when he came in.   Objective: Temp:  [97.5 F (36.4 C)-98.4 F (36.9 C)] 97.6 F (36.4 C) (07/27 0747) Pulse Rate:  [67-101] 72 (07/27 0822) Resp:  [16-20] 18 (07/27 0822) BP: (109-140)/(61-98) 109/65 (07/27 0747) SpO2:  [93 %-98 %] 95 % (07/27 0747) Physical Exam: General: In good spirits, hard of hearing Cardiovascular: Regular rate and rhythm, no murmur  Respiratory: Speaking in full sentences, prolonged expiratory phase, diffuse wheezing Abdomen: Soft, non-tender, non-distended  Extremities: Skin tearing and bruising from frequent falls    Laboratory: Most recent CBC Lab Results  Component Value Date   WBC 17.8 (H) 07/14/2023   HGB 12.7 (L) 07/14/2023   HCT 38.2 (L) 07/14/2023   MCV 91.0 07/14/2023   PLT 277 07/14/2023   Most recent BMP    Latest Ref Rng & Units 07/14/2023    1:39 AM  BMP  Glucose 70 - 99 mg/dL 657   BUN 8 - 23 mg/dL 30   Creatinine 8.46 - 1.24 mg/dL 9.62   Sodium 952 - 841 mmol/L 137   Potassium 3.5 - 5.1 mmol/L 4.0   Chloride 98 - 111 mmol/L 104   CO2 22 - 32 mmol/L 24   Calcium 8.9 - 10.3 mg/dL 8.5    Troponin: 32>44>01>02  Lactate: pending   Imaging/Diagnostic Tests: CT Chest Angio:  - Negative for acute pulmonary embolism. - Increased bronchial infection/inflammation with increased atelectasis or infiltrates in the lower lobes compared with 03/27/2023. - Ascending thoracic aortic aneurysm measuring 40 mm. Recommend annual imaging followup by CTA or MRA.   Alicia Amel, MD 07/14/2023, 12:42 PM  PGY-3, [facility] FPTS Intern pager: [phone], text pages welcome Secure chat group [facility] Teaching Service

## 2023-07-14 NOTE — Evaluation (Signed)
Physical Therapy Evaluation Patient Details Name: Kent Flowers MRN: [id number] DOB: 1946/06/03 Today's Date: 07/14/2023  History of Present Illness  77 y.o. male admitted on 07/13/23 for falls, acute hpoxemia, sepsis, and elevated troponin.  Pt dx with UTI (with hematuria) and possible PNA.  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  Pt with significant PMH of falls, COPD, thoracic laminectomy, L shoulder RTC repair and decompression (2004).  Clinical Impression  Pt is weak, deconditioned, and requires heavy min assist for all mobility.  He will need assist and supervision if he discharges at all times and would benefit from some post acute rehab.  His O2 sats decreased with short distance gait on RA and rebounded in <2 mins once seated to 94%. He reports he does not use supplemental O2 at home at baseline (only inhalers).  Next session, please assess ambulatory saturations with supplemental O2 during gait.  PT will continue to follow acutely for safe mobility progression       Assistance Recommended at Discharge Frequent or constant Supervision/Assistance  If plan is discharge home, recommend the following:  Can travel by private vehicle  A little help with walking and/or transfers;A lot of help with bathing/dressing/bathroom;Assistance with cooking/housework;Two people to help with bathing/dressing/bathroom;Assistance with feeding;Direct supervision/assist for medications management;Direct supervision/assist for financial management;Assist for transportation;Help with stairs or ramp for entrance        Equipment Recommendations None recommended by PT  Recommendations for Other Services  Rehab consult    Functional Status Assessment Patient has had a recent decline in their functional status and demonstrates the ability to make significant improvements in function in a reasonable and  predictable amount of time.     Precautions / Restrictions Precautions Precautions: Fall;Other (comment) Precaution Comments: monitor O2 sats, ambulatory O2 may be needed      Mobility  Bed Mobility               General bed mobility comments: Pt was OOB in the recliner chair    Transfers Overall transfer level: Needs assistance Equipment used: Rolling walker (2 wheels) Transfers: Sit to/from Stand Sit to Stand: Min assist (took two attempts with rocking)           General transfer comment: fear of falling, min assist to power up to stand and stabilize once standing.    Ambulation/Gait Ambulation/Gait assistance: Min assist Gait Distance (Feet): 35 Feet Assistive device: Rolling walker (2 wheels) Gait Pattern/deviations: Step-through pattern, Shuffle Gait velocity: decreased Gait velocity interpretation: <1.31 ft/sec, indicative of household ambulator (putting him at significant risk of falls)   General Gait Details: Pt with parkinsonian-like gait pattern, shuffles, freezes, and once he gets going, does better with longer step lenght (once forward momentum is established).  Stairs            Wheelchair Mobility     Tilt Bed    Modified Rankin (Stroke Patients Only)       Balance Overall balance assessment: Needs assistance Sitting-balance support: Feet supported, Bilateral upper extremity supported Sitting balance-Leahy Scale: Fair     Standing balance support: Bilateral upper extremity supported Standing balance-Leahy Scale: Poor Standing balance comment: needs RW and support of PT                             Pertinent Vitals/Pain Pain Assessment Pain Assessment: Faces Faces Pain Scale: Hurts little  more Pain Location: back of neck Pain Descriptors / Indicators: Aching, Discomfort Pain Intervention(s): Limited activity within patient's tolerance, Monitored during session, Repositioned    Home Living Family/patient expects  to be discharged to:: Private residence Living Arrangements: Alone Available Help at Discharge: Family;Available PRN/intermittently;Personal care attendant (son confirms 4 hours per day 7 days per week) Type of Home: House Home Access: Stairs to enter Entrance Stairs-Rails: Left Entrance Stairs-Number of Steps: 4   Home Layout: One level Home Equipment: Agricultural consultant (2 wheels);Shower seat;Grab bars - tub/shower;BSC/3in1      Prior Function Prior Level of Function : Needs assist;History of Falls (last six months)       Physical Assist : Mobility (physical) Mobility (physical): Bed mobility   Mobility Comments: has difficulty with high bed lifting both legs back into bed, has a bedrail. ADLs Comments: reports indep with use of shower seat, grab bars. Ind with mediactions, has assist with cooking     Hand Dominance   Dominant Hand: Left    Extremity/Trunk Assessment   Upper Extremity Assessment Upper Extremity Assessment: Defer to OT evaluation    Lower Extremity Assessment Lower Extremity Assessment: RLE deficits/detail;LLE deficits/detail RLE Deficits / Details: bil LEs with generalized weakness, grossly 3/5 per seated MMT, pt has scrapes on bil lower legs likely from falls. LLE Deficits / Details: bil LEs with generalized weakness, grossly 3/5 per seated MMT, pt has scrapes on bil lower legs likely from falls.    Cervical / Trunk Assessment Cervical / Trunk Assessment: Kyphotic;Other exceptions;Back Surgery Cervical / Trunk Exceptions: forward head  Communication   Communication: HOH  Cognition Arousal/Alertness: Awake/alert Behavior During Therapy: WFL for tasks assessed/performed Overall Cognitive Status: History of cognitive impairments - at baseline                                 General Comments: family states this is likely baselin, pt refused to answer all of my A&O questions (he knew place, could not tell me month and the refused further  questioning. He did get the name of his caregvers wrong with multiple corrections by his son.        General Comments General comments (skin integrity, edema, etc.): O2 sats with gait are 87% on RA with DOE 3/4, O2 sats quickly rebounded once seated to 94% on RA.  I spent a significant amout of time speaking with the pt and his family about all of the different discharge avenues.  The patient would prefer to go home.    Exercises     Assessment/Plan    PT Assessment Patient needs continued PT services  PT Problem List Decreased strength;Decreased activity tolerance;Decreased balance;Decreased mobility;Decreased cognition;Decreased knowledge of use of DME;Pain;Cardiopulmonary status limiting activity       PT Treatment Interventions DME instruction;Gait training;Stair training;Functional mobility training;Therapeutic activities;Therapeutic exercise;Balance training;Cognitive remediation;Patient/family education;Modalities    PT Goals (Current goals can be found in the Care Plan section)  Acute Rehab PT Goals Patient Stated Goal: to go home PT Goal Formulation: With patient/family Time For Goal Achievement: 07/28/23 Potential to Achieve Goals: Good    Frequency Min 1X/week     Co-evaluation               AM-PAC PT "6 Clicks" Mobility  Outcome Measure Help needed turning from your back to your side while in a flat bed without using bedrails?: A Little Help needed moving from lying on your back to  sitting on the side of a flat bed without using bedrails?: A Little Help needed moving to and from a bed to a chair (including a wheelchair)?: A Little Help needed standing up from a chair using your arms (e.g., wheelchair or bedside chair)?: A Little Help needed to walk in hospital room?: A Little Help needed climbing 3-5 steps with a railing? : Total 6 Click Score: 16    End of Session Equipment Utilized During Treatment: Gait belt Activity Tolerance: Patient limited by  fatigue;Patient limited by pain;Other (comment) (limited by DOE) Patient left: in chair;with call bell/phone within reach Nurse Communication: Mobility status PT Visit Diagnosis: Muscle weakness (generalized) (M62.81);Difficulty in walking, not elsewhere classified (R26.2);Pain Pain - Right/Left:  (neck) Pain - part of body:  (neck)    Time: 4098-1191 PT Time Calculation (min) (ACUTE ONLY): 80 min   Charges:   PT Evaluation $PT Eval Moderate Complexity: 1 Mod PT Treatments $Gait Training: 8-22 mins $Self Care/Home Management: 38-52 (I did not charge for food set up at the end of the session) PT General Charges $$ ACUTE PT VISIT: 1 Visit        Corinna Capra, PT, DPT  Acute Rehabilitation Secure chat is best for contact #[phone] office

## 2023-07-14 NOTE — Progress Notes (Addendum)
SATURATION QUALIFICATIONS: (This note is used to comply with regulatory documentation for home oxygen)  Patient Saturations on Room Air at Rest = 92%  Patient Saturations on Room Air while Ambulating = 87% with 3/4 DOE  Patient Saturations on NT Liters of oxygen while Ambulating = NT%  Please briefly explain why patient needs home oxygen:Pt desaturated on RA while walking with both PT and OT.  He rebounds quickly once seated to 94%.  He reports he does not use supplemental O2 at home, however, he may need to temporarily.  PT to do full O2 assessment next session.   Thanks,  Corinna Capra, PT, DPT  Acute Rehabilitation Secure chat is best for contact #[phone] office

## 2023-07-15 DIAGNOSIS — N1832 Chronic kidney disease, stage 3b: Secondary | ICD-10-CM | POA: Diagnosis present

## 2023-07-15 DIAGNOSIS — R29818 Other symptoms and signs involving the nervous system: Secondary | ICD-10-CM | POA: Diagnosis not present

## 2023-07-15 DIAGNOSIS — J159 Unspecified bacterial pneumonia: Secondary | ICD-10-CM | POA: Diagnosis present

## 2023-07-15 DIAGNOSIS — F17210 Nicotine dependence, cigarettes, uncomplicated: Secondary | ICD-10-CM | POA: Diagnosis present

## 2023-07-15 DIAGNOSIS — N179 Acute kidney failure, unspecified: Secondary | ICD-10-CM | POA: Diagnosis not present

## 2023-07-15 DIAGNOSIS — G309 Alzheimer's disease, unspecified: Secondary | ICD-10-CM | POA: Diagnosis present

## 2023-07-15 DIAGNOSIS — E860 Dehydration: Secondary | ICD-10-CM | POA: Diagnosis present

## 2023-07-15 DIAGNOSIS — J439 Emphysema, unspecified: Secondary | ICD-10-CM | POA: Diagnosis present

## 2023-07-15 DIAGNOSIS — Z881 Allergy status to other antibiotic agents status: Secondary | ICD-10-CM | POA: Diagnosis not present

## 2023-07-15 DIAGNOSIS — N401 Enlarged prostate with lower urinary tract symptoms: Secondary | ICD-10-CM | POA: Diagnosis present

## 2023-07-15 DIAGNOSIS — W19XXXD Unspecified fall, subsequent encounter: Secondary | ICD-10-CM | POA: Diagnosis present

## 2023-07-15 DIAGNOSIS — R0602 Shortness of breath: Secondary | ICD-10-CM | POA: Diagnosis present

## 2023-07-15 DIAGNOSIS — E538 Deficiency of other specified B group vitamins: Secondary | ICD-10-CM | POA: Diagnosis present

## 2023-07-15 DIAGNOSIS — R310 Gross hematuria: Secondary | ICD-10-CM

## 2023-07-15 DIAGNOSIS — J9601 Acute respiratory failure with hypoxia: Secondary | ICD-10-CM | POA: Diagnosis not present

## 2023-07-15 DIAGNOSIS — N1831 Chronic kidney disease, stage 3a: Secondary | ICD-10-CM | POA: Diagnosis not present

## 2023-07-15 DIAGNOSIS — J440 Chronic obstructive pulmonary disease with acute lower respiratory infection: Secondary | ICD-10-CM | POA: Diagnosis present

## 2023-07-15 DIAGNOSIS — R6339 Other feeding difficulties: Secondary | ICD-10-CM | POA: Diagnosis not present

## 2023-07-15 DIAGNOSIS — R296 Repeated falls: Secondary | ICD-10-CM | POA: Diagnosis present

## 2023-07-15 DIAGNOSIS — G8321 Monoplegia of upper limb affecting right dominant side: Secondary | ICD-10-CM | POA: Diagnosis present

## 2023-07-15 DIAGNOSIS — G8320 Monoplegia of upper limb affecting unspecified side: Secondary | ICD-10-CM | POA: Diagnosis not present

## 2023-07-15 DIAGNOSIS — Z66 Do not resuscitate: Secondary | ICD-10-CM | POA: Diagnosis present

## 2023-07-15 DIAGNOSIS — J189 Pneumonia, unspecified organism: Secondary | ICD-10-CM

## 2023-07-15 DIAGNOSIS — S161XXA Strain of muscle, fascia and tendon at neck level, initial encounter: Secondary | ICD-10-CM

## 2023-07-15 DIAGNOSIS — S12110D Anterior displaced Type II dens fracture, subsequent encounter for fracture with routine healing: Secondary | ICD-10-CM | POA: Diagnosis not present

## 2023-07-15 DIAGNOSIS — F02A0 Dementia in other diseases classified elsewhere, mild, without behavioral disturbance, psychotic disturbance, mood disturbance, and anxiety: Secondary | ICD-10-CM | POA: Diagnosis present

## 2023-07-15 DIAGNOSIS — J441 Chronic obstructive pulmonary disease with (acute) exacerbation: Secondary | ICD-10-CM | POA: Diagnosis present

## 2023-07-15 DIAGNOSIS — N3091 Cystitis, unspecified with hematuria: Secondary | ICD-10-CM | POA: Diagnosis present

## 2023-07-15 DIAGNOSIS — J9621 Acute and chronic respiratory failure with hypoxia: Secondary | ICD-10-CM | POA: Diagnosis not present

## 2023-07-15 DIAGNOSIS — R338 Other retention of urine: Secondary | ICD-10-CM | POA: Diagnosis not present

## 2023-07-15 DIAGNOSIS — W19XXXA Unspecified fall, initial encounter: Secondary | ICD-10-CM | POA: Diagnosis present

## 2023-07-15 DIAGNOSIS — Z1152 Encounter for screening for COVID-19: Secondary | ICD-10-CM | POA: Diagnosis not present

## 2023-07-15 DIAGNOSIS — F03A0 Unspecified dementia, mild, without behavioral disturbance, psychotic disturbance, mood disturbance, and anxiety: Secondary | ICD-10-CM | POA: Diagnosis not present

## 2023-07-15 DIAGNOSIS — G894 Chronic pain syndrome: Secondary | ICD-10-CM | POA: Diagnosis present

## 2023-07-15 DIAGNOSIS — A419 Sepsis, unspecified organism: Secondary | ICD-10-CM | POA: Diagnosis present

## 2023-07-15 DIAGNOSIS — Z79899 Other long term (current) drug therapy: Secondary | ICD-10-CM | POA: Diagnosis not present

## 2023-07-15 DIAGNOSIS — K5903 Drug induced constipation: Secondary | ICD-10-CM | POA: Diagnosis present

## 2023-07-15 LAB — BASIC METABOLIC PANEL WITH GFR
Anion gap: 8 (ref 5–15)
BUN: 42 mg/dL — ABNORMAL HIGH (ref 8–23)
CO2: 24 mmol/L (ref 22–32)
Calcium: 8.2 mg/dL — ABNORMAL LOW (ref 8.9–10.3)
Chloride: 99 mmol/L (ref 98–111)
Creatinine, Ser: 1.9 mg/dL — ABNORMAL HIGH (ref 0.61–1.24)
GFR, Estimated: 36 mL/min — ABNORMAL LOW (ref >60)
Glucose, Bld: 180 mg/dL — ABNORMAL HIGH (ref 70–99)
Potassium: 4.7 mmol/L (ref 3.5–5.1)
Sodium: 131 mmol/L — ABNORMAL LOW (ref 135–145)

## 2023-07-15 LAB — CBC
HCT: 35.6 % — ABNORMAL LOW (ref 39.0–52.0)
HCT: 40.2 % (ref 39.0–52.0)
Hemoglobin: 11.4 g/dL — ABNORMAL LOW (ref 13.0–17.0)
Hemoglobin: 12.7 g/dL — ABNORMAL LOW (ref 13.0–17.0)
MCH: 30.4 pg (ref 26.0–34.0)
MCH: 30.1 pg (ref 26.0–34.0)
MCHC: 32 g/dL (ref 30.0–36.0)
MCHC: 31.6 g/dL (ref 30.0–36.0)
MCV: 94.9 fL (ref 80.0–100.0)
MCV: 95.3 fL (ref 80.0–100.0)
Platelets: 282 10*3/uL (ref 150–400)
Platelets: 299 10*3/uL (ref 150–400)
RBC: 3.75 MIL/uL — ABNORMAL LOW (ref 4.22–5.81)
RBC: 4.22 MIL/uL (ref 4.22–5.81)
RDW: 14.8 % (ref 11.5–15.5)
RDW: 14.6 % (ref 11.5–15.5)
WBC: 17.4 10*3/uL — ABNORMAL HIGH (ref 4.0–10.5)
WBC: 15.7 10*3/uL — ABNORMAL HIGH (ref 4.0–10.5)
nRBC: 0 % (ref 0.0–0.2)
nRBC: 0 % (ref 0.0–0.2)

## 2023-07-15 LAB — BASIC METABOLIC PANEL
Anion gap: 15 (ref 5–15)
BUN: 35 mg/dL — ABNORMAL HIGH (ref 8–23)
CO2: 21 mmol/L — ABNORMAL LOW (ref 22–32)
Calcium: 8.7 mg/dL — ABNORMAL LOW (ref 8.9–10.3)
Chloride: 99 mmol/L (ref 98–111)
Creatinine, Ser: 1.77 mg/dL — ABNORMAL HIGH (ref 0.61–1.24)
GFR, Estimated: 40 mL/min — ABNORMAL LOW (ref >60)
Glucose, Bld: 247 mg/dL — ABNORMAL HIGH (ref 70–99)
Potassium: 4.7 mmol/L (ref 3.5–5.1)
Sodium: 135 mmol/L (ref 135–145)

## 2023-07-15 MED ORDER — POLYETHYLENE GLYCOL 3350 17 G PO PACK
17.0000 g | PACK | Freq: Every day | ORAL | Status: DC
  Administered 2023-07-15 – 2023-07-16 (×2): 17 g via ORAL
  Filled 2023-07-15 (×2): qty 1

## 2023-07-15 MED ORDER — CEFDINIR 300 MG PO CAPS
300.0000 mg | ORAL_CAPSULE | Freq: Two times a day (BID) | ORAL | Status: AC
  Administered 2023-07-15 – 2023-07-17 (×6): 300 mg via ORAL
  Filled 2023-07-15 (×6): qty 1

## 2023-07-15 MED ORDER — CHLORHEXIDINE GLUCONATE CLOTH 2 % EX PADS
6.0000 | MEDICATED_PAD | Freq: Every day | CUTANEOUS | Status: DC
  Administered 2023-07-16 – 2023-07-19 (×4): 6 via TOPICAL

## 2023-07-15 MED ORDER — OXYCODONE HCL 5 MG PO TABS
15.0000 mg | ORAL_TABLET | Freq: Four times a day (QID) | ORAL | Status: DC
  Administered 2023-07-15 – 2023-07-16 (×5): 15 mg via ORAL
  Filled 2023-07-15 (×5): qty 3

## 2023-07-15 MED ORDER — ALBUTEROL SULFATE HFA 108 (90 BASE) MCG/ACT IN AERS
1.0000 | INHALATION_SPRAY | RESPIRATORY_TRACT | Status: DC | PRN
  Filled 2023-07-15: qty 6.7

## 2023-07-15 MED ORDER — LACTATED RINGERS IV SOLN: INTRAVENOUS | Status: DC

## 2023-07-15 MED ORDER — IPRATROPIUM-ALBUTEROL 0.5-2.5 (3) MG/3ML IN SOLN
RESPIRATORY_TRACT | Status: AC
  Administered 2023-07-15: 3 mL
  Filled 2023-07-15: qty 3

## 2023-07-15 MED ORDER — SENNOSIDES-DOCUSATE SODIUM 8.6-50 MG PO TABS
1.0000 | ORAL_TABLET | Freq: Every day | ORAL | Status: DC
  Administered 2023-07-15: 1 via ORAL
  Filled 2023-07-15: qty 1

## 2023-07-15 NOTE — Assessment & Plan Note (Signed)
Multifactorial: sepsis 2/2 to UTI, poor balance in the setting of reduced neck range of motion, dehydration.  PT/OT recommending CIR - Family on board with placement to SNF or CIR  - Fall and delirium precautions - Weaning oxycodone as tolerated

## 2023-07-15 NOTE — Progress Notes (Signed)
Pt continues w/ hematuria and some urinary retention, pt I&O cath x1 for 400 ml this shift and has spontaneously voided x1 for this shift, last bladder scan at 0400 = 260 ml. Several 'clots' at tip of condom cath. Will monitor, and assess frequently

## 2023-07-15 NOTE — Plan of Care (Signed)
  Problem: Education: Goal: Knowledge of General Education information will improve Description: Including pain rating scale, medication(s)/side effects and non-pharmacologic comfort measures Outcome: Progressing   Problem: Clinical Measurements: Goal: Respiratory complications will improve Outcome: Progressing   Problem: Nutrition: Goal: Adequate nutrition will be maintained Outcome: Progressing   Problem: Elimination: Goal: Will not experience complications related to bowel motility Outcome: Progressing Goal: Will not experience complications related to urinary retention Outcome: Progressing   Problem: Elimination: Goal: Will not experience complications related to bowel motility Outcome: Progressing Goal: Will not experience complications related to urinary retention Outcome: Progressing   Problem: Pain Managment: Goal: General experience of comfort will improve Outcome: Progressing

## 2023-07-15 NOTE — Assessment & Plan Note (Addendum)
Bladder scans overnight showed retention requiring I&O cath x1 with +400 cc removed w/ multiple blood clots present. Cr elevated to 1.9 most likely secondary to obstruction. Hgb stable at 11.7. Spoke with Dr. Ronne Binning who will put the patient on his list to see and recommended foley placement.  - Urology consulted, appreciate recommendations  - RN to place Foley cath 7/28, if difficult to place reach out to Dr. Ronne Binning.  - Continue Flomax 0.4 mg daily  - Continue CTX (7/26 - ) - Urine culture pending  - Outpatient fu with [facility] for likely cystoscopy - Bladder scans Q4H until foley in place  - Hold Lovenox for DVT  - ordered SCDs - daily CBC

## 2023-07-15 NOTE — Consult Note (Signed)
Urology Consult  Referring physician: Dr. Hyacinth Meeker Reason for referral: difficult foley placement, gross hematuria  Chief Complaint: difficulty urinating  History of Present Illness: Kent Flowers is a 76yo with a history of BPH and gross hematuria who was admitted with worsening hematuria and UTI. He was seen at [facility] on THursday and diagnosed with a UTI. His urine culture is pending. He has been having gross hematuria for 1 week. He is currently on doxycycline. Nursing has made multiple attempts to place a foley which were unsuccessful  Past Medical History:  Diagnosis Date   Benign localized prostatic hyperplasia with lower urinary tract symptoms (LUTS)    Chronic low back pain    Common bile duct stone 11/03/2020   COPD (chronic obstructive pulmonary disease) with emphysema (HCC)    PULMOLOGIST-- DR Joni Fears YOUNG   Diskitis 05/10/2020   Dizziness 05/10/2020   Ectatic thoracic aorta (HCC)    ED (erectile dysfunction)    Fecal incontinence 03/24/2020   Full dentures    Gross hematuria    History of squamous cell carcinoma in situ    03-14-2013---  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   Wears glasses    Past Surgical History:  Procedure Laterality Date   CYSTOSCOPY WITH BIOPSY N/A 04/10/2017   Procedure: POSSIBLE BLADDER  BIOPSY;  Surgeon: Jerilee Field, MD;  Location: [facility];  Service: Urology;   Laterality: N/A;   CYSTOSCOPY WITH URETHRAL DILATATION N/A 04/10/2017   Procedure: CYSTOSCOPY WITH BALLOON URETHRALSTRICTURE DILATATION;  Surgeon: Jerilee Field, MD;  Location: [facility];  Service: Urology;  Laterality: N/A;   ENDOSCOPIC RETROGRADE CHOLANGIOPANCREATOGRAPHY (ERCP) WITH PROPOFOL N/A 09/20/2022   Procedure: ENDOSCOPIC RETROGRADE CHOLANGIOPANCREATOGRAPHY (ERCP) WITH PROPOFOL;  Surgeon: Meryl Dare, MD;  Location: [facility] ENDOSCOPY;  Service: Gastroenterology;  Laterality: N/A;   PENILE BIOPSY N/A 03/14/2013   Procedure: PENILE BIOPSY;  Surgeon: Antony Haste, MD;  Location: [facility];  Service: Urology;  Laterality: N/A;   REATTACHMENT LEFT INDEX FINGER  1988   REMOVAL OF STONES  09/20/2022   Procedure: REMOVAL OF STONES;  Surgeon: Meryl Dare, MD;  Location: [facility] ENDOSCOPY;  Service: Gastroenterology;;   SHOULDER ARTHROSCOPY WITH ROTATOR CUFF REPAIR AND SUBACROMIAL DECOMPRESSION Left 2004   THORACIC LAMINECTOMY FOR EPIDURAL ABSCESS N/A 12/18/2019   Procedure: THORACIC LAMINECTOMY FOR EPIDURAL ABSCESS Thoracic Two, Thoracic Three;  Surgeon: Jadene Pierini, MD;  Location: [facility];  Service: Neurosurgery;  Laterality: N/A;  THORACIC LAMINECTOMY FOR EPIDURAL ABSCESS Thoracic Two, Thoracic Three   TONSILLECTOMY  AS CHILD    Medications: I have reviewed the patient's current medications. Allergies:  Allergies  Allergen Reactions   Ceclor [Cefaclor] Rash    Tolerates ceftriaxone and cefazolin   Sulfa Antibiotics Other (See Comments) and Rash    SEVERE RASH- childhood allergy  SEVERE RASH, Childhood  allergy, Other reaction(s): Rash, SEVERE RASH- childhood allergy    Family History  Problem Relation Age of Onset   Emphysema Mother    Cancer Father        bile duct   Social History:  reports that he quit smoking about 13 years ago. His smoking use included cigarettes. He started smoking about 25 years ago. He has a 12 pack-year  smoking history. He has never used smokeless tobacco. He reports current alcohol use of about 15.0 standard drinks of alcohol per week. He reports that he does not use drugs.  Review of Systems  Genitourinary:  Positive for difficulty urinating and hematuria.  All other systems reviewed and are negative.   Physical Exam:  Vital signs in last 24 hours: Temp:  [97.7 F (36.5 C)-99 F (37.2 C)] 97.8 F (36.6 C) (07/28 0932) Pulse Rate:  [61-79] 74 (07/28 0932) Resp:  [16-18] 17 (07/28 0932) BP: (102-131)/(59-71) 131/59 (07/28 0932) SpO2:  [87 %-96 %] 94 % (07/28 0932) Weight:  [83.5 kg] 83.5 kg (07/28 0509) Physical Exam Vitals reviewed.  Constitutional:      Appearance: He is well-developed.  HENT:     Head: Normocephalic and atraumatic.  Cardiovascular:     Rate and Rhythm: Normal rate and regular rhythm.  Pulmonary:     Effort: Pulmonary effort is normal. No tachypnea.  Abdominal:     Palpations: Abdomen is soft.  Musculoskeletal:        General: Normal range of motion.     Cervical back: Normal range of motion and neck supple.  Skin:    General: Skin is warm and dry.  Neurological:     General: No focal deficit present.     Mental Status: He is alert and oriented to person, place, and time.  Psychiatric:        Mood and Affect: Mood normal.        Behavior: Behavior normal.     Laboratory Data:  Results for orders placed or performed during the hospital encounter of 07/13/23 (from the past 72 hour(s))  Brain natriuretic peptide     Status: None   Collection Time: 07/13/23  9:13 AM  Result Value Ref Range   B Natriuretic Peptide 51.7 0.0 - 100.0 pg/mL    Comment: Performed at [facility], [address] [place], [place] [address]  CBC with Differential     Status: Abnormal   Collection Time: 07/13/23  9:13 AM  Result Value Ref Range   WBC 28.5 (H) 4.0 - 10.5 K/uL   RBC 4.28 4.22 - 5.81 MIL/uL   Hemoglobin 12.9 (L) 13.0 - 17.0 g/dL   HCT 44.0 10.2 -  72.5 %   MCV 94.4 80.0 - 100.0 fL   MCH 30.1 26.0 - 34.0 pg   MCHC 31.9 30.0 - 36.0 g/dL   RDW 36.6 44.0 - 34.7 %   Platelets 292 150 - 400 K/uL   nRBC 0.0 0.0 - 0.2 %   Neutrophils Relative % 90 %   Neutro Abs 25.6 (H) 1.7 - 7.7 K/uL   Lymphocytes Relative 5 %   Lymphs Abs 1.5 0.7 - 4.0 K/uL   Monocytes Relative 4 %   Monocytes Absolute 1.1 (H) 0.1 - 1.0 K/uL   Eosinophils Relative 0 %   Eosinophils Absolute 0.0 0.0 - 0.5 K/uL   Basophils Relative 0 %   Basophils Absolute 0.0 0.0 - 0.1 K/uL   Immature Granulocytes 1 %   Abs Immature  Granulocytes 0.21 (H) 0.00 - 0.07 K/uL    Comment: Performed at [facility], [address] [place], [place] [address]  SARS Coronavirus 2 by RT PCR (hospital order, performed in [facility] hospital lab) *cepheid single result test* Anterior Nasal Swab     Status: None   Collection Time: 07/13/23  9:50 AM   Specimen: Anterior Nasal Swab  Result Value Ref Range   SARS Coronavirus 2 by RT PCR NEGATIVE NEGATIVE    Comment: Performed at [facility], [address] [place], [place] [address]  I-Stat venous blood gas, ED     Status: Abnormal   Collection Time: 07/13/23 10:01 AM  Result Value Ref Range   pH, Ven 7.403 7.25 - 7.43   pCO2, Ven 41.2 (L) 44 - 60 mmHg   pO2, Ven 130 (H) 32 - 45 mmHg   Bicarbonate 25.7 20.0 - 28.0 mmol/L   TCO2 27 22 - 32 mmol/L   O2 Saturation 99 %   Acid-Base Excess 1.0 0.0 - 2.0 mmol/L   Sodium 137 135 - 145 mmol/L   Potassium 4.7 3.5 - 5.1 mmol/L   Calcium, Ion 1.01 (L) 1.15 - 1.40 mmol/L   HCT 41.0 39.0 - 52.0 %   Hemoglobin 13.9 13.0 - 17.0 g/dL   Sample type VENOUS   Comprehensive metabolic panel     Status: Abnormal   Collection Time: 07/13/23 11:19 AM  Result Value Ref Range   Sodium 138 135 - 145 mmol/L   Potassium 4.1 3.5 - 5.1 mmol/L   Chloride 105 98 - 111 mmol/L   CO2 22 22 - 32 mmol/L   Glucose, Bld 166 (H) 70 - 99 mg/dL    Comment: Glucose reference range applies only to samples taken  after fasting for at least 8 hours.   BUN 23 8 - 23 mg/dL   Creatinine, Ser 4.40 (H) 0.61 - 1.24 mg/dL   Calcium 7.8 (L) 8.9 - 10.3 mg/dL   Total Protein 6.0 (L) 6.5 - 8.1 g/dL   Albumin 3.3 (L) 3.5 - 5.0 g/dL   AST 26 15 - 41 U/L   ALT 16 0 - 44 U/L   Alkaline Phosphatase 71 38 - 126 U/L   Total Bilirubin 1.1 0.3 - 1.2 mg/dL   GFR, Estimated 48 (L) >60 mL/min    Comment: (NOTE) Calculated using the CKD-EPI Creatinine Equation (2021)    Anion gap 11 5 - 15    Comment: Performed at [facility], [address], [place], [place] [address]  Troponin I (High Sensitivity)     Status: None   Collection Time: 07/13/23 11:19 AM  Result Value Ref Range   Troponin I (High Sensitivity) 16 <18 ng/L    Comment: (NOTE) Elevated high sensitivity troponin I (hsTnI) values and significant  changes across serial measurements may suggest ACS but many other  chronic and acute conditions are known to elevate hsTnI results.  Refer to the "Links" section for chest pain algorithms and additional  guidance. Performed at [facility], [address], [place], [place] [address]   Troponin I (High Sensitivity)     Status: Abnormal   Collection Time: 07/13/23 12:53 PM  Result Value Ref Range   Troponin I (High Sensitivity) 19 (H) <18 ng/L    Comment: (NOTE) Elevated high sensitivity troponin I (hsTnI) values and significant  changes across serial measurements may suggest ACS but many other  chronic and acute conditions are known  to elevate hsTnI results.  Refer to the "Links" section for chest pain algorithms and additional  guidance. Performed at [facility], [address] [place], [place] [address]   Urinalysis, Routine w reflex microscopic -Urine, Clean Catch     Status: Abnormal   Collection Time: 07/13/23  1:20 PM  Result Value Ref Range   Color, Urine RED (A) YELLOW    Comment: BIOCHEMICALS MAY BE AFFECTED BY COLOR MICROSCOPIC EXAM PERFORMED ON UNCONCENTRATED URINE     APPearance TURBID (A) CLEAR   Specific Gravity, Urine  1.005 - 1.030    TEST NOT REPORTED DUE TO COLOR INTERFERENCE OF URINE PIGMENT   pH  5.0 - 8.0    TEST NOT REPORTED DUE TO COLOR INTERFERENCE OF URINE PIGMENT   Glucose, UA (A) NEGATIVE mg/dL    TEST NOT REPORTED DUE TO COLOR INTERFERENCE OF URINE PIGMENT   Hgb urine dipstick (A) NEGATIVE    TEST NOT REPORTED DUE TO COLOR INTERFERENCE OF URINE PIGMENT   Bilirubin Urine (A) NEGATIVE    TEST NOT REPORTED DUE TO COLOR INTERFERENCE OF URINE PIGMENT   Ketones, ur (A) NEGATIVE mg/dL    TEST NOT REPORTED DUE TO COLOR INTERFERENCE OF URINE PIGMENT   Protein, ur (A) NEGATIVE mg/dL    TEST NOT REPORTED DUE TO COLOR INTERFERENCE OF URINE PIGMENT   Nitrite (A) NEGATIVE    TEST NOT REPORTED DUE TO COLOR INTERFERENCE OF URINE PIGMENT   Leukocytes,Ua (A) NEGATIVE    TEST NOT REPORTED DUE TO COLOR INTERFERENCE OF URINE PIGMENT    Comment: Performed at [facility], [address] [place], [place] [address]  Urinalysis, Microscopic (reflex)     Status: Abnormal   Collection Time: 07/13/23  1:20 PM  Result Value Ref Range   RBC / HPF >50 0 - 5 RBC/hpf   WBC, UA 11-20 0 - 5 WBC/hpf   Bacteria, UA FEW (A) NONE SEEN   Squamous Epithelial / HPF 0-5 0 - 5 /HPF    Comment: Performed at [facility], [address], [place], [place] [address]  Culture, blood (Routine X 2) w Reflex to ID Panel     Status: None (Preliminary result)   Collection Time: 07/13/23  2:10 PM   Specimen: BLOOD RIGHT HAND  Result Value Ref Range   Specimen Description BLOOD RIGHT HAND    Special Requests      BOTTLES DRAWN AEROBIC AND ANAEROBIC Blood Culture results may not be optimal due to an inadequate volume of blood received in culture bottles   Culture      NO GROWTH 2 DAYS Performed at [facility], [address], [place], [place] [address]    Report Status PENDING   Urine Culture (for pregnant, neutropenic or urologic patients or patients with an  indwelling urinary catheter)     Status: None   Collection Time: 07/13/23  2:11 PM   Specimen: Urine, Clean Catch  Result Value Ref Range   Specimen Description URINE, CLEAN CATCH    Special Requests NONE    Culture      NO GROWTH Performed at [facility], [address], [place], [place] [address]    Report Status 07/14/2023 FINAL   Culture, blood (Routine X 2) w Reflex to ID Panel     Status: None (Preliminary result)   Collection Time: 07/13/23  5:39 PM   Specimen: BLOOD LEFT ARM  Result Value Ref Range  Specimen Description BLOOD LEFT ARM    Special Requests      BOTTLES DRAWN AEROBIC AND ANAEROBIC Blood Culture adequate volume   Culture      NO GROWTH 2 DAYS Performed at [facility], [address] [place], [place] [address]    Report Status PENDING   Troponin I (High Sensitivity)     Status: None   Collection Time: 07/13/23  5:39 PM  Result Value Ref Range   Troponin I (High Sensitivity) 15 <18 ng/L    Comment: (NOTE) Elevated high sensitivity troponin I (hsTnI) values and significant  changes across serial measurements may suggest ACS but many other  chronic and acute conditions are known to elevate hsTnI results.  Refer to the "Links" section for chest pain algorithms and additional  guidance. Performed at [facility], [address] [place], [place] [address]   Troponin I (High Sensitivity)     Status: None   Collection Time: 07/13/23  6:27 PM  Result Value Ref Range   Troponin I (High Sensitivity) 14 <18 ng/L    Comment: (NOTE) Elevated high sensitivity troponin I (hsTnI) values and significant  changes across serial measurements may suggest ACS but many other  chronic and acute conditions are known to elevate hsTnI results.  Refer to the "Links" section for chest pain algorithms and additional  guidance. Performed at [facility], [address], [place], [place] [address]   Basic metabolic panel     Status: Abnormal    Collection Time: 07/14/23  1:39 AM  Result Value Ref Range   Sodium 137 135 - 145 mmol/L   Potassium 4.0 3.5 - 5.1 mmol/L   Chloride 104 98 - 111 mmol/L   CO2 24 22 - 32 mmol/L   Glucose, Bld 190 (H) 70 - 99 mg/dL    Comment: Glucose reference range applies only to samples taken after fasting for at least 8 hours.   BUN 30 (H) 8 - 23 mg/dL   Creatinine, Ser 4.01 (H) 0.61 - 1.24 mg/dL   Calcium 8.5 (L) 8.9 - 10.3 mg/dL   GFR, Estimated 42 (L) >60 mL/min    Comment: (NOTE) Calculated using the CKD-EPI Creatinine Equation (2021)    Anion gap 9 5 - 15    Comment: Performed at [facility], [address], [place], [place] [address]  CBC     Status: Abnormal   Collection Time: 07/14/23  1:39 AM  Result Value Ref Range   WBC 17.8 (H) 4.0 - 10.5 K/uL   RBC 4.20 (L) 4.22 - 5.81 MIL/uL   Hemoglobin 12.7 (L) 13.0 - 17.0 g/dL   HCT 36.6 (L) 44.0 - 34.7 %   MCV 91.0 80.0 - 100.0 fL   MCH 30.2 26.0 - 34.0 pg   MCHC 33.2 30.0 - 36.0 g/dL   RDW 42.5 95.6 - 38.7 %   Platelets 277 150 - 400 K/uL   nRBC 0.0 0.0 - 0.2 %    Comment: Performed at [facility], [address] [place], [place] [address]  Lactic acid, plasma     Status: None   Collection Time: 07/14/23  6:55 AM  Result Value Ref Range   Lactic Acid, Venous 0.9 0.5 - 1.9 mmol/L    Comment: Performed at [facility], [address] [place], [place] [address]  Glucose, capillary     Status: Abnormal   Collection Time: 07/14/23  4:43 PM  Result  Value Ref Range   Glucose-Capillary 122 (H) 70 - 99 mg/dL    Comment: Glucose reference range applies only to samples taken after fasting for at least 8 hours.  CBC     Status: Abnormal   Collection Time: 07/15/23  1:06 AM  Result Value Ref Range   WBC 17.4 (H) 4.0 - 10.5 K/uL   RBC 3.75 (L) 4.22 - 5.81 MIL/uL   Hemoglobin 11.4 (L) 13.0 - 17.0 g/dL   HCT 62.1 (L) 30.8 - 65.7 %   MCV 94.9 80.0 - 100.0 fL   MCH 30.4 26.0 - 34.0 pg   MCHC 32.0 30.0 - 36.0 g/dL   RDW  84.6 96.2 - 95.2 %   Platelets 282 150 - 400 K/uL   nRBC 0.0 0.0 - 0.2 %    Comment: Performed at [facility], [address], [place], [place] [address]  Basic metabolic panel     Status: Abnormal   Collection Time: 07/15/23  1:06 AM  Result Value Ref Range   Sodium 131 (L) 135 - 145 mmol/L   Potassium 4.7 3.5 - 5.1 mmol/L   Chloride 99 98 - 111 mmol/L   CO2 24 22 - 32 mmol/L   Glucose, Bld 180 (H) 70 - 99 mg/dL    Comment: Glucose reference range applies only to samples taken after fasting for at least 8 hours.   BUN 42 (H) 8 - 23 mg/dL   Creatinine, Ser 4.40 (H) 0.61 - 1.24 mg/dL   Calcium 8.2 (L) 8.9 - 10.3 mg/dL   GFR, Estimated 36 (L) >60 mL/min    Comment: (NOTE) Calculated using the CKD-EPI Creatinine Equation (2021)    Anion gap 8 5 - 15    Comment: Performed at [facility], [address], [place], [place] [address]   Recent Results (from the past 240 hour(s))  SARS Coronavirus 2 by RT PCR (hospital order, performed in [facility] hospital lab) *cepheid single result test* Anterior Nasal Swab     Status: None   Collection Time: 07/13/23  9:50 AM   Specimen: Anterior Nasal Swab  Result Value Ref Range Status   SARS Coronavirus 2 by RT PCR NEGATIVE NEGATIVE Final    Comment: Performed at [facility], [address] [place], [place] [address]  Culture, blood (Routine X 2) w Reflex to ID Panel     Status: None (Preliminary result)   Collection Time: 07/13/23  2:10 PM   Specimen: BLOOD RIGHT HAND  Result Value Ref Range Status   Specimen Description BLOOD RIGHT HAND  Final   Special Requests   Final    BOTTLES DRAWN AEROBIC AND ANAEROBIC Blood Culture results may not be optimal due to an inadequate volume of blood received in culture bottles   Culture   Final    NO GROWTH 2 DAYS Performed at [facility], [address], [place], [place] [address]    Report Status PENDING  Incomplete  Urine Culture (for pregnant, neutropenic or urologic  patients or patients with an indwelling urinary catheter)     Status: None   Collection Time: 07/13/23  2:11 PM   Specimen: Urine, Clean Catch  Result Value Ref Range Status   Specimen Description URINE, CLEAN CATCH  Final   Special Requests NONE  Final   Culture   Final    NO GROWTH Performed at [facility], [address] [place], [place] [address]    Report  Status 07/14/2023 FINAL  Final  Culture, blood (Routine X 2) w Reflex to ID Panel     Status: None (Preliminary result)   Collection Time: 07/13/23  5:39 PM   Specimen: BLOOD LEFT ARM  Result Value Ref Range Status   Specimen Description BLOOD LEFT ARM  Final   Special Requests   Final    BOTTLES DRAWN AEROBIC AND ANAEROBIC Blood Culture adequate volume   Culture   Final    NO GROWTH 2 DAYS Performed at [facility], [address], [place], [place] [address]    Report Status PENDING  Incomplete   Creatinine: Recent Labs    07/13/23 1119 07/14/23 0139 07/15/23 0106  CREATININE 1.50* 1.70* 1.90*   Baseline Creatinine: 1.5  Impression/Assessment:  76yo with urinary retention and gross hematuria  Plan:  16 french coude catheter placed and 400cc of brown bloody urine drained. Foley to remain in place for 5-7 days prior to attempting voiding trial Please continue doxycycline pending urine culture  Kent Flowers 07/15/2023, 12:50 PM

## 2023-07-15 NOTE — Progress Notes (Signed)
Daily Progress Note Intern Pager: [phone]  Patient name: Kent Flowers [facility] Medical record number: [id number] Date of birth: [birth year] Age: 77 y.o. Gender: male  Primary Care Provider: Earnest Rosier, MD Consultants: Urology  Code Status: DNR/DNI  Pt Overview and Major Events to Date:  7/26: Admitted FMTS  7/27: Required I&O cath ON  7/28: Consulted urology, placed foley cath   Assessment and Plan:  Kent Flowers is a 77 yo M with PMHx of DISH of C spine, COPD not on home oxygen, and chronic pain with narcotic pain medications who was admitted after a fall w neck pain, increased WOB with increased sputum production, and gross hematuria.   -      Hospital     * (Principal) Gross hematuria     Bladder scans overnight showed retention requiring I&O cath x1 with  +400 cc removed w/ multiple blood clots present. Cr elevated to 1.9 most  likely secondary to obstruction. Hgb stable at 11.7. Spoke with Dr.  Ronne Binning who will put the patient on his list to see and recommended foley  placement.  - Urology consulted, appreciate recommendations  - RN to place Foley cath 7/28, if difficult to place reach out to Dr.  Ronne Binning.  - Continue Flomax 0.4 mg daily  - Continue CTX (7/26 - ) - Urine culture pending  - Outpatient fu with [facility] for likely cystoscopy - Bladder scans Q4H until foley in place  - Hold Lovenox for DVT  - ordered SCDs - daily CBC        Recurrent falls     Multifactorial: sepsis 2/2 to UTI, poor balance in the setting of  reduced neck range of motion, dehydration.  PT/OT recommending CIR - Family on board with placement to SNF or CIR  - Fall and delirium precautions - Weaning oxycodone as tolerated         Alcohol use     Reports drinking 2-3 drinks poor day but is not a great historian. CIWA  4-5 ON.  - Continue CIWA for additional 24 hours, if scoring <5 can discontinue  - Thiamine, folate supplementation        Acute on chronic  respiratory failure with hypoxia (HCC)     H/o COPD w/ moderately severe obstruction. Continues to be stable on  RA. CT PE on 7/26 negative for PE but with infiltrate suggestive of  bilateral lower lobe PNA vs atelectasis.   - Ceftriaxone (7/26- ) and Azithromycin (7/27- 7/29) - Consider transitioning to cefdinir PO for remainder of antibiotic  course.  - S/p solumedrol 125mg  with EMS 7/26 - Prednisone 40mg  (7/27 - 7/30) - Continue home Trelegy  - Continue home Albuterol PRN Q4H      Chronic and Stable Medical Conditions:  Neck Pain/DISH C-spine: continue oxycodone 15 mg QID, scheduled tylenol, lidocaine patch  Constipation 2/2 to Opioid Use: start Miralax and Senna 7/28 Lymphedema: continue TED hose  Dementia: continue Seroquel 25 gm daily at bedtime, continue Aricept 10 mg daily at bedtime.  FEN/GI: Regular diet  PPx: SCDs, Lovenox DC overnight due to gross hematuria  Dispo:Pending PT recommendations  in 2-3 days. Barriers include ongoing medical workup for gross hematuria and safe disposition planning.   Subjective:  Urinary retention overnight. Otherwise doing well this morning.   Objective: Temp:  [97.7 F (36.5 C)-99 F (37.2 C)] 97.8 F (36.6 C) (07/28 0932) Pulse Rate:  [61-79] 74 (07/28 0932) Resp:  [16-18] 17 (07/28 0932) BP: (102-131)/(59-71)  131/59 (07/28 0932) SpO2:  [87 %-96 %] 94 % (07/28 0932) Weight:  [83.5 kg] 83.5 kg (07/28 0509) Physical Exam: General: alert, oriented, chronically ill appearing, NAD  Cardiovascular: regular rate, regular rhythm no murmurs on exam  Respiratory: diffuse expiratory wheezing without focal consolidations, no increased work of breathing  Abdomen: soft, non-tender, non-distended    Laboratory: Most recent CBC Lab Results  Component Value Date   WBC 17.4 (H) 07/15/2023   HGB 11.4 (L) 07/15/2023   HCT 35.6 (L) 07/15/2023   MCV 94.9 07/15/2023   PLT 282 07/15/2023   Most recent BMP    Latest Ref Rng & Units 07/15/2023     1:06 AM  BMP  Glucose 70 - 99 mg/dL 161   BUN 8 - 23 mg/dL 42   Creatinine 0.96 - 1.24 mg/dL 0.45   Sodium 409 - 811 mmol/L 131   Potassium 3.5 - 5.1 mmol/L 4.7   Chloride 98 - 111 mmol/L 99   CO2 22 - 32 mmol/L 24   Calcium 8.9 - 10.3 mg/dL 8.2    Glendale Chard, DO 07/15/2023, 9:48 AM  PGY-2, [facility] FPTS Intern pager: [phone], text pages welcome Secure chat group [facility] Teaching Service

## 2023-07-15 NOTE — Assessment & Plan Note (Addendum)
H/o COPD w/ moderately severe obstruction. Continues to be stable on RA. CT PE on 7/26 negative for PE but with infiltrate suggestive of bilateral lower lobe PNA vs atelectasis.   - Ceftriaxone (7/26- ) and Azithromycin (7/27- 7/29) - Consider transitioning to cefdinir PO for remainder of antibiotic course.  - S/p solumedrol 125mg  with EMS 7/26 - Prednisone 40mg  (7/27 - 7/30) - Continue home Trelegy  - Continue home Albuterol PRN Q4H

## 2023-07-15 NOTE — Assessment & Plan Note (Signed)
Reports drinking 2-3 drinks poor day but is not a great historian. CIWA 4-5 ON.  - Continue CIWA for additional 24 hours, if scoring <5 can discontinue  - Thiamine, folate supplementation

## 2023-07-15 NOTE — Progress Notes (Signed)
Patient required I&O cath x1 last night due to retention which showed gross hematuria with blood clots. Urology was consulted today and successfully placed foley for outlet obstruction.   Patient had subsequent rise in creatinine, most likely post-renal secondary to obstruction from blood clots. Will recheck BMP tonight to assess kidney function and started gentle LR maintenance fluids at 75 mL/hr after foley was placed. Will need to continue strict I&Os and if he does not have good urine output, will need to repeat bladder scan to ensure foley has not been blocked by clots.   Also treating hypoxemic respiratory failure most likely secondary COPD v. CAP. S/P ceftriaxone x3 days and transitioned to cefdinir 300 mg BID for additional 2 days. Will also complete 3 day course of azithromycin for atypical coverage on 7/29.   Plan:  AKI:  - repeat BMP @1700  today, pending collection  - continue LR @75  mL/hr  - AM BMP  - Strict I&Os - Low threshold for bladder scan if UO not appropriate  - Continue foley (placed 7/28) for 5-7 days per urology   Acute Hypoxemic Respiratory Failure: - s/p CTX x3 days  - continue cefdinir 300 mg PO until 7/30 - continue azithromycin until 7/29  - continue home Breo and Incruse Inhalers  - continue albuterol inhaler   Glendale Chard, DO [facility], PGY-2 07/15/23 5:16 PM

## 2023-07-15 NOTE — Progress Notes (Signed)
Inpatient Rehab Admissions Coordinator Note:   Per therapy patient was screened for CIR candidacy by Riley Hallum Luvenia Starch, CCC-SLP. At this time, pt is under observation status. Pt may not have the medical necessity to warrant an inpatient rehab stay if they remain observation. If status were to change to inpatient, CIR admissions team will screen for candidacy.    Kent Phoenix, MS, CCC-SLP Admissions Coordinator [phone] 07/15/23 4:40 PM

## 2023-07-15 NOTE — Assessment & Plan Note (Deleted)
Hx of COPD with moderately severe obstruction. Patient required O2 in ED, though stable on RA overnight. CT PE negative for PE but with infiltrate suggestive of bilateral lower lobe ?PNA vs atelectasis.  Will expand abx coverage to cover for CAP. Remains with some expiratory wheeze but much, much improved compared to yesterday.  - Ceftriaxone (7/26- ) and Azithromycin (7/27- ) - S/p solumedrol 125mg  with EMS yesterday, start prednisone 40mg  x4 days - Sch Duonebs scheduled q4h - Continue home Trelegy

## 2023-07-16 DIAGNOSIS — R310 Gross hematuria: Secondary | ICD-10-CM | POA: Diagnosis not present

## 2023-07-16 DIAGNOSIS — J9601 Acute respiratory failure with hypoxia: Secondary | ICD-10-CM | POA: Diagnosis not present

## 2023-07-16 MED ORDER — OXYCODONE HCL 5 MG PO TABS
15.0000 mg | ORAL_TABLET | ORAL | Status: DC
  Administered 2023-07-16 – 2023-07-20 (×18): 15 mg via ORAL
  Filled 2023-07-16 (×19): qty 3

## 2023-07-16 MED ORDER — SENNOSIDES-DOCUSATE SODIUM 8.6-50 MG PO TABS
1.0000 | ORAL_TABLET | Freq: Two times a day (BID) | ORAL | Status: DC
  Administered 2023-07-17 – 2023-07-19 (×5): 1 via ORAL
  Filled 2023-07-16 (×5): qty 1

## 2023-07-16 MED ORDER — OXYCODONE HCL 5 MG PO TABS
15.0000 mg | ORAL_TABLET | Freq: Every evening | ORAL | Status: DC | PRN
  Administered 2023-07-17 – 2023-07-20 (×3): 15 mg via ORAL
  Filled 2023-07-16 (×4): qty 3

## 2023-07-16 MED ORDER — ALUM & MAG HYDROXIDE-SIMETH 200-200-20 MG/5ML PO SUSP
15.0000 mL | Freq: Once | ORAL | Status: AC
  Administered 2023-07-16: 15 mL via ORAL
  Filled 2023-07-16: qty 30

## 2023-07-16 MED ORDER — POLYETHYLENE GLYCOL 3350 17 G PO PACK
17.0000 g | PACK | Freq: Two times a day (BID) | ORAL | Status: DC
  Administered 2023-07-17 – 2023-07-19 (×4): 17 g via ORAL
  Filled 2023-07-16 (×5): qty 1

## 2023-07-16 NOTE — Assessment & Plan Note (Signed)
Multifactorial: sepsis 2/2 to UTI, poor balance in the setting of reduced neck range of motion, dehydration.  PT/OT recommending CIR -Hopeful for CIR  - Fall and delirium precautions

## 2023-07-16 NOTE — Progress Notes (Signed)
Inpatient Rehab Coordinator Note:  I met with patient at bedside to discuss CIR recommendations and goals/expectations of CIR stay.  We reviewed 3 hrs/day of therapy, physician follow up, and average length of stay 2 weeks (dependent upon progress) with goals of supervision.  We discussed need for prior auth for CIR and I will ask a CIR MD to weigh in for support.  Also will need updated OT note to begin insurance auth.  I'll call family to discuss caregiver support.     Estill Dooms, PT, DPT Admissions Coordinator [phone] 07/16/23  4:08 PM

## 2023-07-16 NOTE — Progress Notes (Signed)
     Subjective: NAEON. Pt up in chair  having lunch with family friend  Objective: Vital signs in last 24 hours: Temp:  [97.6 F (36.4 C)-98.4 F (36.9 C)] 98.4 F (36.9 C) (07/29 0854) Pulse Rate:  [61-77] 75 (07/29 0900) Resp:  [17-20] 18 (07/29 0900) BP: (128-134)/(66-73) 128/66 (07/29 0854) SpO2:  [92 %-96 %] 95 % (07/29 0900)  Intake/Output from previous day: 07/28 0701 - 07/29 0700 In: 700 [P.O.:700] Out: 2350 [Urine:2350]  Intake/Output this shift: No intake/output data recorded.  Physical Exam:  General: Alert and oriented CV: No cyanosis Lungs: equal chest rise Gu: foley catheter in place with light red urine.   Lab Results: Recent Labs    07/15/23 0106 07/15/23 1842 07/16/23 0050  HGB 11.4* 12.7* 11.3*  HCT 35.6* 40.2 35.2*   BMET Recent Labs    07/15/23 1842 07/16/23 0050  NA 135 135  K 4.7 4.5  CL 99 99  CO2 21* 24  GLUCOSE 247* 129*  BUN 35* 35*  CREATININE 1.77* 1.61*  CALCIUM 8.7* 8.6*     Studies/Results: No results found.  Assessment/Plan: # Hemorrhagic cystitis Patient was being treated with ABX and had been experiencing gross hematuria for 1 week prior to admission to the hospital.  Difficult Foley placement by Dr. Ronne Binning.  84f coud catheter with 400 cc brown bloody urine returned on 7/28. Foley to remain in place for 5-7 days prior to attempting voiding trial. Will continue to follow along   LOS: 1 day   Elmon Kirschner, NP [facility] Pager: [phone]  07/16/2023, 12:33 PM

## 2023-07-16 NOTE — Progress Notes (Deleted)
Daily Progress Note Intern Pager: [phone]  Patient name: Kent Flowers [facility] Medical record number: [id number] Date of birth: [birth year] Age: 77 y.o. 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    [facility]     * (Principal) Gross hematuria     Had urinary retention with >400 mL on 7/28 PM. Urology placed 16 F  catheter.  - Urology consulted, appreciate recommendations  - Continue Flomax 0.4 mg daily  - Urine culture pending  - Outpatient fu with [facility] for likely cystoscopy - Hold Lovenox for DVT  - ordered SCDs - daily CBC - will need outpatient evaluation for cause  - Will discuss with family and patient why on aspirin (if primary  prevention can discontinue)         Recurrent falls     Multifactorial: sepsis 2/2 to UTI, poor balance in the setting of  reduced neck range of motion, dehydration.  PT/OT recommending CIR -Hopeful for CIR  - Fall and delirium precautions        Alcohol use     Reports drinking 2-3 drinks poor day but is not a great historian. CIWA  4-5 ON.  - continue supplements, CIWAS discontinued         Acute renal failure superimposed on stage 3a chronic kidney disease  (HCC)     - Improving slowly, baseline creatinine around 1.5-1.6 - Discontinue IVF fluids - Suspect due to both prerenal (dehydration) and obstruction - Strict I/O  - Avoid nephrotoxic agents - Did receive contrast on admission         Acute on chronic respiratory failure with hypoxia (HCC)     Likely due to  CAP, improving, still requiring O2 with walking.  - Ceftriaxone (7/26-7/28 ) and Azithromycin (7/27- 7/29) - Will complete course on Cefdinir (end date 7/31)  - S/p solumedrol 125mg  with EMS 7/26 - Prednisone 40mg  (7/27 - 7/30) - Continue home Trelegy  - Continue home Albuterol PRN Q4H - Previously recommended to use home oxygen  - Walk test today to see if needs oxygen when walking today      Chronic and Stable Medical Conditions:  Neck Pain/DISH C-spine: increased oxycodone 15 mg QID and prn at bedtime, scheduled tylenol, lidocaine patch  Constipation 2/2 to Opioid Use: increased dosing of Miralax and Senna 7/29 to BID  Lymphedema: continue TED hose  Dementia: continue Seroquel 25 gm daily at bedtime, continue Aricept 10 mg daily at bedtime.   FEN/GI: Regular diet  PPx: SCDs, Lovenox DC overnight due to gross hematuria  Dispo:Pending PT recommendations  in 2-3 days. Barriers include ongoing medical workup for gross hematuria and safe disposition planning.   FEN/GI: Regular PPx: SCDs in setting of gross hematuria  Dispo:CIR awaiting acceptance.   Subjective:  Patient reports feeling okay this AM. He reports he is still having some back pain but otherwise is doing okay. He reports he cannot remember if he pooped today.   Objective: Temp:  [  97.6 F (36.4 C)-98.5 F (36.9 C)] 98.5 F (36.9 C) (07/29 1439) Pulse Rate:  [61-86] 86 (07/29 1439) Resp:  [17-20] 17 (07/29 1439) BP: (128-152)/(66-86) 152/86 (07/29 1439) SpO2:  [92 %-96 %] 95 % (07/29 1439) Physical Exam: General: comfortable in bed, NAD Cardiovascular: RRR, no m/r/g Respiratory: saturing well on RA, slightly coarse breath sounds bilatearlly  Abdomen: soft, nontender- foley in place, no urine seen in bag Extremities: no LEE   Laboratory: Most recent CBC Lab Results  Component Value Date   WBC 16.4 (H) 07/16/2023   HGB 11.3 (L) 07/16/2023   HCT 35.2 (L) 07/16/2023   MCV 93.1 07/16/2023   PLT 293 07/16/2023    Most recent BMP    Latest Ref Rng & Units 07/16/2023   12:50 AM  BMP  Glucose 70 - 99 mg/dL 161   BUN 8 - 23 mg/dL 35   Creatinine 0.96 - 1.24 mg/dL 0.45   Sodium 409 - 811 mmol/L 135   Potassium 3.5 - 5.1 mmol/L 4.5   Chloride 98 - 111 mmol/L 99   CO2 22 - 32 mmol/L 24   Calcium 8.9 - 10.3 mg/dL 8.6     Other pertinent labs Urine culture pending    Imaging/Diagnostic Tests: None Georg Ruddle, Justice Aguirre, MD 07/16/2023, 3:45 PM  PGY-1, [facility] FPTS Intern pager: [phone], text pages welcome Secure chat group [facility] Teaching Service

## 2023-07-16 NOTE — Progress Notes (Signed)
Physical Therapy Treatment Patient Details Name: Kent Flowers MRN: [id number] DOB: January 09, 1946 Today's Date: 07/16/2023   History of Present Illness 77 y.o. male admitted on 07/13/23 for falls, acute hpoxemia, sepsis, and elevated troponin.  Pt dx with UTI (with hematuria) and possible PNA.  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  Pt with significant PMH of falls, COPD, thoracic laminectomy, L shoulder RTC repair and decompression (2004).    PT Comments  Pt is slowly progressing towards goals. Pt most likely was deconditioned prior to hospitalization. Currently pt requires Mod A for sit to stand, Min a for short distance gait and 1x requiring Total assist for sitting in recliner due to poor safety awareness. Pt is a high fall risk. Due to current functional status, home set up and available assistance at home recommending skilled physical therapy services at a higher level of care and frequency (5x/week) in order to decrease risk for falls, decrease burden of care on caregivers, decrease risk for injury and re-hospitalization. If pt returns home he will require physical assistance for functional mobility and 24/7 supervision in order to prevent falls/injury. Pt O2 sats remained 93-94% during short distance gait.    If plan is discharge home, recommend the following: A little help with walking and/or transfers;Assistance with cooking/housework;Assist for transportation;Help with stairs or ramp for entrance   Can travel by private vehicle        Equipment Recommendations  None recommended by PT    Recommendations for Other Services       Precautions / Restrictions Precautions Precautions: Fall;Other (comment) Precaution Comments: monitor O2 sats, ambulatory O2 may be needed Restrictions Weight Bearing Restrictions: No     Mobility  Bed Mobility   General bed mobility comments: Pt was  recieved in recliner and returned to recliner at end of session. Patient Response: Anxious  Transfers Overall transfer level: Needs assistance Equipment used: Rolling walker (2 wheels) Transfers: Sit to/from Stand, Bed to chair/wheelchair/BSC Sit to Stand: Mod assist, Total assist   Step pivot transfers: Mod assist, Total assist       General transfer comment: fear of falling, Mod assist for initial momentum to get to standing from recliner and BSC. Pt requires Max A for standing to sitting requiring heavy multi modal cueing for AD and safe turns prior to sitting. Pt started leaning back heavily and yelling "no, no, no" Total assist to recliner at end fo session. Pt then stated " Don't ever do that I was in complete control." Pt was educated that he was not assisted into recliner until he was already falling posteriorly and yelling out.    Ambulation/Gait Ambulation/Gait assistance: Min assist Gait Distance (Feet): 15 Feet (15 ft 2x from recliner to [facility] in bathroom) Assistive device: Rolling walker (2 wheels) Gait Pattern/deviations: Step-through pattern, Shuffle Gait velocity: decreased cadence Gait velocity interpretation: <1.31 ft/sec, indicative of household ambulator   General Gait Details: Pt with parkinsonian-like gait pattern, shuffles, freezes and improved with verbal cues for big steps. Pt tends to try to keep COM posterior to BOS    Tilt Bed Tilt Bed Patient Response: Anxious  Modified Rankin (Stroke Patients Only)       Balance Overall balance assessment: Needs assistance Sitting-balance support: Feet supported, Bilateral upper extremity supported Sitting balance-Leahy Scale: Fair     Standing balance support: Bilateral upper extremity supported Standing balance-Leahy Scale: Poor Standing balance comment: needs RW and support of  PT with posterior COM over BOS        Cognition Arousal/Alertness: Awake/alert Behavior During Therapy: WFL for tasks  assessed/performed Overall Cognitive Status: History of cognitive impairments - at baseline         General Comments: poor safety awareness.           General Comments General comments (skin integrity, edema, etc.): O2 sats 93-94% with gait. HR between 84 and 104 bpm.      Pertinent Vitals/Pain Pain Assessment Pain Assessment: No/denies pain     PT Goals (current goals can now be found in the care plan section) Acute Rehab PT Goals Patient Stated Goal: to go home PT Goal Formulation: With patient/family Time For Goal Achievement: 07/28/23 Potential to Achieve Goals: Good Progress towards PT goals: Progressing toward goals    Frequency    Min 1X/week      PT Plan Current plan remains appropriate       AM-PAC PT "6 Clicks" Mobility   Outcome Measure  Help needed turning from your back to your side while in a flat bed without using bedrails?: A Little Help needed moving from lying on your back to sitting on the side of a flat bed without using bedrails?: A Little Help needed moving to and from a bed to a chair (including a wheelchair)?: A Lot Help needed standing up from a chair using your arms (e.g., wheelchair or bedside chair)?: A Lot Help needed to walk in hospital room?: A Little Help needed climbing 3-5 steps with a railing? : Total 6 Click Score: 14    End of Session Equipment Utilized During Treatment: Gait belt Activity Tolerance: Patient limited by fatigue;Patient limited by pain;Other (comment) (pt limiting activity due to stating he needs to have a BM but was unable when assisted to [facility]) Patient left: in chair;with call bell/phone within reach Nurse Communication: Mobility status;Other (comment) (pt is stating he needs to take a BM (therapist assist to [facility] but pt was unable to have a BM)) PT Visit Diagnosis: Muscle weakness (generalized) (M62.81);Difficulty in walking, not elsewhere classified (R26.2);Pain     Time: 9147-8295 PT Time Calculation  (min) (ACUTE ONLY): 30 min  Charges:    $Therapeutic Activity: 23-37 mins PT General Charges $$ ACUTE PT VISIT: 1 Visit                     Harrel Carina, DPT, CLT  Acute Rehabilitation Services Office: [phone] (Secure chat preferred)    Claudia Desanctis 07/16/2023, 2:16 PM

## 2023-07-16 NOTE — Assessment & Plan Note (Addendum)
Had urinary retention with >400 mL on 7/28 PM. Urology placed 16 F catheter.  - Urology consulted, appreciate recommendations  - Continue Flomax 0.4 mg daily  - Urine culture pending  - Outpatient fu with [facility] for likely cystoscopy - Hold Lovenox for DVT  - ordered SCDs - daily CBC - will need outpatient evaluation for cause  - Will discuss with family and patient why on aspirin (if primary prevention can discontinue)

## 2023-07-16 NOTE — Assessment & Plan Note (Addendum)
Likely due to CAP, improving, still requiring O2 with walking.  - Ceftriaxone (7/26-7/28 ) and Azithromycin (7/27- 7/29) - Will complete course on Cefdinir (end date 7/31)  - S/p solumedrol 125mg  with EMS 7/26 - Prednisone 40mg  (7/27 - 7/30) - Continue home Trelegy  - Continue home Albuterol PRN Q4H - Previously recommended to use home oxygen  - Walk test today to see if needs oxygen when walking today

## 2023-07-16 NOTE — Assessment & Plan Note (Signed)
Reports drinking 2-3 drinks poor day but is not a great historian. CIWA 4-5 ON.  - continue supplements, CIWAS discontinued

## 2023-07-16 NOTE — Progress Notes (Signed)
Inpatient Rehab Admissions Coordinator:   Pt now inpatient status.  Reviewed chart and pt does appear to be a potential candidate for CIR. I will place a consult order per our protocol and an [facility] will follow up.   Estill Dooms, PT, DPT Admissions Coordinator [phone] 07/16/23  12:10 PM

## 2023-07-16 NOTE — Assessment & Plan Note (Addendum)
Stable today around baseline (1.62)  - continue to monitor  - I/Os

## 2023-07-16 NOTE — Progress Notes (Signed)
Daily Progress Note Intern Pager: [phone]  Patient name: Kent Flowers [facility] Medical record number: [id number] Date of birth: 08/23/1946 Age: 77 y.o. Gender: male  Primary Care Provider: Earnest Rosier, MD Consultants: Urology Code Status: DNR   Pt Overview and Major Events to Date:    Assessment and Plan:  Mr. Murzyn is a pleasant 77 yo with history of COPD, frequent falls, DISH, and prior urethral stricture presenting with falls and acute hypoxemic respiratory failure found to have CAP and urinary retention causing AKI.  [facility]     * (Principal) Gross hematuria     Had urinary retention with >400 mL on 7/28 PM. Urology placed 16 F  catheter.  - Urology consulted, appreciate recommendations  - Continue Flomax 0.4 mg daily  - Urine culture pending  - Outpatient fu with [facility] for likely cystoscopy - Hold Lovenox for DVT  - ordered SCDs - daily CBC - will need outpatient evaluation for cause  - Will discuss with family and patient why on aspirin (if primary  prevention can discontinue)         Recurrent falls     Multifactorial: sepsis 2/2 to UTI, poor balance in the setting of  reduced neck range of motion, dehydration.  PT/OT recommending CIR -Hopeful for CIR  - Fall and delirium precautions        Alcohol use     Reports drinking 2-3 drinks poor day but is not a great historian. CIWA  4-5 ON.  - continue supplements, CIWAS discontinued         Acute renal failure superimposed on stage 3a chronic kidney disease  (HCC)     - Improving slowly, baseline creatinine around 1.5-1.6 - Discontinue IVF fluids - Suspect due to both prerenal (dehydration) and obstruction - Strict I/O  - Avoid nephrotoxic agents - Did receive contrast on admission         Acute on chronic respiratory failure with hypoxia (HCC)     Likely due to CAP, improving, still requiring O2 with walking.  - Ceftriaxone (7/26-7/28 ) and Azithromycin (7/27- 7/29) - Will  complete course on Cefdinir (end date 7/31)  - S/p solumedrol 125mg  with EMS 7/26 - Prednisone 40mg  (7/27 - 7/30) - Continue home Trelegy  - Continue home Albuterol PRN Q4H - Previously recommended to use home oxygen  - Walk test today to see if needs oxygen when walking today      DISH and Chronic pain - Oxycodone scheduled every 4 hours during day and PRN at night  Memory Changes - Seroquel BID - Donepezil at night   B12 Deficiency - Home supplementation   FEN/GI: regular PPx: SCDs given hematuria  Dispo:CIR tomorrow. Barriers include CIR acceptance.   Subjective:  Patient seen this AM. Doing well. Reports breathing is improved. Neck pain improved but still present.   Objective: Temp:  [97.6 F (36.4 C)-98.5 F (36.9 C)] 98.5 F (36.9 C) (07/29 1439) Pulse Rate:  [61-86] 86 (07/29 1439) Resp:  [17-20] 17 (07/29 1439) BP: (128-152)/(66-86) 152/86 (07/29 1439) SpO2:  [92 %-96 %] 95 % (07/29 1439) Physical Exam: General: Pleasant, ill appearing, kyphosis of neck  Cardiovascular: RRR Respiratory: Coarse bilaterally but improved, on RA Abdomen: Soft, nontender,  Foley in place with merlot colored urine  Extremities: No edema, no SCDS on (messaged nursing)   Laboratory: Most recent CBC Lab Results  Component Value Date   WBC 16.4 (H) 07/16/2023  HGB 11.3 (L) 07/16/2023   HCT 35.2 (L) 07/16/2023   MCV 93.1 07/16/2023   PLT 293 07/16/2023   Most recent BMP    Latest Ref Rng & Units 07/16/2023   12:50 AM  BMP  Glucose 70 - 99 mg/dL 188   BUN 8 - 23 mg/dL 35   Creatinine 4.16 - 1.24 mg/dL 6.06   Sodium 301 - 601 mmol/L 135   Potassium 3.5 - 5.1 mmol/L 4.5   Chloride 98 - 111 mmol/L 99   CO2 22 - 32 mmol/L 24   Calcium 8.9 - 10.3 mg/dL 8.6     Other pertinent labs: Will reach out to [facility] regarding urine culture   Westley Chandler, MD 07/16/2023, 3:06 PM   FPTS Intern pager: [phone], text pages welcome Secure chat group [facility] Teaching Service

## 2023-07-16 NOTE — Assessment & Plan Note (Addendum)
Multifactorial: sepsis 2/2 to UTI, poor balance in the setting of reduced neck range of motion, dehydration.  PT/OT recommending CIR -Hopeful for CIR  - Fall and delirium precautions

## 2023-07-16 NOTE — Progress Notes (Signed)
Patient ID: Kent Flowers, male   DOB: [birth year], 77 y.o.   MRN: [id number] Request to call patients's son, Gladstone Pih. Questions about urine culture, no results visible yet. Will notify MD to call. Lidia Collum, RN

## 2023-07-16 NOTE — Assessment & Plan Note (Signed)
-   Improving slowly, baseline creatinine around 1.5-1.6 - Discontinue IVF fluids - Suspect due to both prerenal (dehydration) and obstruction - Strict I/O  - Avoid nephrotoxic agents - Did receive contrast on admission

## 2023-07-17 DIAGNOSIS — N179 Acute kidney failure, unspecified: Secondary | ICD-10-CM

## 2023-07-17 DIAGNOSIS — R29818 Other symptoms and signs involving the nervous system: Secondary | ICD-10-CM | POA: Diagnosis not present

## 2023-07-17 DIAGNOSIS — N1831 Chronic kidney disease, stage 3a: Secondary | ICD-10-CM

## 2023-07-17 DIAGNOSIS — J9621 Acute and chronic respiratory failure with hypoxia: Secondary | ICD-10-CM | POA: Diagnosis not present

## 2023-07-17 DIAGNOSIS — F03A0 Unspecified dementia, mild, without behavioral disturbance, psychotic disturbance, mood disturbance, and anxiety: Secondary | ICD-10-CM

## 2023-07-17 DIAGNOSIS — R310 Gross hematuria: Secondary | ICD-10-CM | POA: Diagnosis not present

## 2023-07-17 DIAGNOSIS — R296 Repeated falls: Secondary | ICD-10-CM | POA: Diagnosis not present

## 2023-07-17 DIAGNOSIS — J189 Pneumonia, unspecified organism: Secondary | ICD-10-CM | POA: Diagnosis not present

## 2023-07-17 MED ORDER — ALBUTEROL SULFATE (2.5 MG/3ML) 0.083% IN NEBU
2.5000 mg | INHALATION_SOLUTION | RESPIRATORY_TRACT | Status: DC | PRN
  Administered 2023-07-17 – 2023-07-23 (×5): 2.5 mg via RESPIRATORY_TRACT
  Filled 2023-07-17 (×5): qty 3

## 2023-07-17 NOTE — Progress Notes (Signed)
     Subjective: Kent Flowers. Pt up in chair.  Objective: Vital signs in last 24 hours: Temp:  [98.5 F (36.9 C)-98.7 F (37.1 C)] 98.7 F (37.1 C) (07/30 0851) Pulse Rate:  [73-86] 75 (07/30 0851) Resp:  [16-21] 16 (07/30 0851) BP: (125-159)/(83-86) 125/85 (07/30 0851) SpO2:  [95 %-96 %] 95 % (07/30 0851)  Intake/Output from previous day: 07/29 0701 - 07/30 0700 In: -  Out: 1900 [Urine:1900]  Intake/Output this shift: No intake/output data recorded.  Physical Exam:  General: Alert and oriented CV: No cyanosis Lungs: equal chest rise Gu: foley catheter in place with light red urine.   Lab Results: Recent Labs    07/15/23 1842 07/16/23 0050 07/17/23 0023  HGB 12.7* 11.3* 11.3*  HCT 40.2 35.2* 35.3*   BMET Recent Labs    07/16/23 0050 07/17/23 0023  NA 135 136  K 4.5 4.4  CL 99 101  CO2 24 26  GLUCOSE 129* 163*  BUN 35* 34*  CREATININE 1.61* 1.62*  CALCIUM 8.6* 8.4*     Studies/Results: No results found.  Assessment/Plan: # Hemorrhagic cystitis Patient was being treated with ABX and had been experiencing gross hematuria for 1 week prior to admission to the hospital.  Difficult Foley placement by Dr. Ronne Binning.  74f coud catheter with 400 cc brown bloody urine returned on 7/28. Foley to remain in place for 5-7 days prior to attempting voiding trial. UOP , Scr stable, 1.62 Will continue to follow along   LOS: 2 days   Elmon Kirschner, NP [facility] Pager: [phone]  07/17/2023, 1:49 PM

## 2023-07-17 NOTE — Progress Notes (Signed)
FMTS Interim Progress Note  Spoke with patient's son Gladstone Pih) and Elias's wife.  He states that he was called regarding CIR placement for his father and believes that this is a really good option for him. He reports that while his father may not be able to live alone, he feels that with rehab he will be able to regain much of his independence.  He reports that at baseline patient is able to complete most of his ADLs and will require little assistance due to decreased strength on his right side. He reports that the family is currently looking at assisted living facilities for the patient and are willing and motivated to get a bed open for him if that is the reason patient cannot get into CIR. He reports that he spoke with Cyrus (TOC) regarding placement and would like some more resources at home to pursue finding placement at ALF for patient.  Gladstone Pih and wife reports that he has a strong support system and they are all motivated and able to secure ALF for patient with some guidance regarding how to find an tour facilities. They both state that they feel CIR would be best place for him and feel that he would be able to receive great care and regain much of his independence by being in CIR. They report that they are currently living in [place] and are open to looking at facilities both in the [place] area as well as near [facility]. Told Gladstone Pih that I would reach back out to Cyrus to coordinate more information regarding options for ALF's.   Also asked Gladstone Pih regarding patient's aspirin use. Gladstone Pih reports that patient was put on aspirin by his neurologist after MRI found microvascular changes. He reports that patient was taking that medication every morning.   Georg Ruddle Keari Miu, MD 07/17/2023, 3:04 PM PGY-1, [facility] Service pager [phone]

## 2023-07-17 NOTE — Assessment & Plan Note (Addendum)
Had urinary retention with >400 mL on 7/28 PM. Urology placed 16 F catheter on 7/28. Urine culture negative.  - Urology consulted, appreciate recommendations  - Foley to remain in place for 5-7 days prior to attempting voiding trial (7/28 - 8/1 to 8/3)  - Continue Flomax 0.4 mg daily  - Outpatient fu with [facility] for likely cystoscopy - Hold Lovenox for now  - ordered SCDs - daily CBC - will need outpatient evaluation for cause  - Will discuss with family and patient why on aspirin (if primary prevention can discontinue)

## 2023-07-17 NOTE — Progress Notes (Signed)
Inpatient Rehab Admissions Coordinator:   Spoke to pt's son via phone to f/u on consult per Dr. Riley Kill.  Goals would be for supervision/mod I with 1-2 week LOS.  Pt currently has 4 hours of assist at home, but family aware with this admission that pt will require more help.  They are looking into possible ALF at discharge and have attempted to contact social work for assist.  I let our [facility] team know to f/u with them.  We could potentially pursue CIR with discharge to ALF if pt has a bed lined up.  Will follow.   Estill Dooms, PT, DPT Admissions Coordinator [phone] 07/17/23  1:43 PM

## 2023-07-17 NOTE — Progress Notes (Signed)
Physical Therapy Treatment Patient Details Name: Kent Flowers MRN: [id number] DOB: [birth year] Today's Date: 07/17/2023   History of Present Illness 77 y.o. male admitted on 07/13/23 for falls, acute hpoxemia, sepsis, and elevated troponin.  Pt dx with UTI (with hematuria) and possible PNA.  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  Pt with significant PMH of falls, COPD, thoracic laminectomy, L shoulder RTC repair and decompression (2004).    PT Comments  Pt with fair tolerance to treatment today.  Pt able to take a few steps today in room with Mod A HHA however was very limited by fatigue and very high fear of falling. No change in DC/DME recs at this time. Pt will continue to follow.   If plan is discharge home, recommend the following: A little help with walking and/or transfers;Assistance with cooking/housework;Assist for transportation;Help with stairs or ramp for entrance   Can travel by private vehicle     Yes  Equipment Recommendations  None recommended by PT    Recommendations for Other Services       Precautions / Restrictions Precautions Precautions: Fall;Other (comment) Precaution Comments: monitor O2 sats, ambulatory O2 may be needed Restrictions Weight Bearing Restrictions: No     Mobility  Bed Mobility               General bed mobility comments: Pt received in chair    Transfers Overall transfer level: Needs assistance Equipment used: 1 person hand held assist Transfers: Sit to/from Stand Sit to Stand: Mod assist           General transfer comment: Pt with very high fear of falling. Opted for HHA due to pt with limited use of RUE. Wide BOS upon standing    Ambulation/Gait Ambulation/Gait assistance: Mod assist Gait Distance (Feet): 10 Feet Assistive device: 1 person hand held assist Gait Pattern/deviations: Step-through pattern, Shuffle Gait  velocity: decreased cadence     General Gait Details: Pt with parkinsonian-like gait pattern, shuffles, freezes and improved with verbal cues for big steps. Pt tends to try to keep COM posterior to BOS (Similar presentation to previous session)   Stairs             Wheelchair Mobility     Tilt Bed    Modified Rankin (Stroke Patients Only)       Balance Overall balance assessment: Needs assistance Sitting-balance support: Feet supported, Bilateral upper extremity supported Sitting balance-Leahy Scale: Fair Sitting balance - Comments: EOB   Standing balance support: During functional activity, Reliant on assistive device for balance, Bilateral upper extremity supported Standing balance-Leahy Scale: Poor Standing balance comment: Reliant on therapist for support                            Cognition Arousal/Alertness: Awake/alert Behavior During Therapy: WFL for tasks assessed/performed Overall Cognitive Status: History of cognitive impairments - at baseline                                 General Comments: poor safety awareness, not aware of deficits, not sure of month/day, able to state 2024. Pt cannot remember timeframes for surgeries or impairments. Pt requires increased time to answer questions, constant verbal cueing for sequencing, direction, and reminders during activity.        Exercises  General Comments General comments (skin integrity, edema, etc.): VSS      Pertinent Vitals/Pain Pain Assessment Pain Assessment: Faces Faces Pain Scale: Hurts a little bit Pain Location: back of neck Pain Descriptors / Indicators: Aching, Discomfort Pain Intervention(s): Monitored during session, Limited activity within patient's tolerance    Home Living                          Prior Function            PT Goals (current goals can now be found in the care plan section) Progress towards PT goals: Progressing toward  goals    Frequency    Min 1X/week      PT Plan Current plan remains appropriate    Co-evaluation              AM-PAC PT "6 Clicks" Mobility   Outcome Measure  Help needed turning from your back to your side while in a flat bed without using bedrails?: A Little Help needed moving from lying on your back to sitting on the side of a flat bed without using bedrails?: A Little Help needed moving to and from a bed to a chair (including a wheelchair)?: A Lot Help needed standing up from a chair using your arms (e.g., wheelchair or bedside chair)?: A Lot Help needed to walk in hospital room?: A Lot Help needed climbing 3-5 steps with a railing? : Total 6 Click Score: 13    End of Session Equipment Utilized During Treatment: Gait belt Activity Tolerance: Patient limited by fatigue;Patient limited by pain;Other (comment) (High fear of falling) Patient left: in chair;with call bell/phone within reach;with chair alarm set Nurse Communication: Mobility status PT Visit Diagnosis: Muscle weakness (generalized) (M62.81);Difficulty in walking, not elsewhere classified (R26.2);Pain     Time: 5621-3086 PT Time Calculation (min) (ACUTE ONLY): 16 min  Charges:    $Gait Training: 8-22 mins PT General Charges $$ ACUTE PT VISIT: 1 Visit                     Shela Nevin, PT, DPT Acute Rehab Services [phone]    Gladys Damme 07/17/2023, 3:36 PM

## 2023-07-17 NOTE — Progress Notes (Signed)
Occupational Therapy Treatment Patient Details Name: Kent Flowers MRN: [id number] DOB: 1946-08-16 Today's Date: 07/17/2023   History of present illness 77 y.o. male admitted on 07/13/23 for falls, acute hpoxemia, sepsis, and elevated troponin.  Pt dx with UTI (with hematuria) and possible PNA.  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  Pt with significant PMH of falls, COPD, thoracic laminectomy, L shoulder RTC repair and decompression (2004).   OT comments  Pt able to fully participate in therapy with increased rest breaks due to SOB, O2 declined to 91 during physical activities, recovered quickly upon resting. Pt requires increased time for sequencing and answering questions, increased need for verbal cueing due to decreased awareness of deficits, Pt A/O to person, place, situation, and correctly stated year, difficulty with recalling timeframe for medical history. Pt presents with limited use of RUE, decreased shoulder ABD/flexion, MCPs stiff in neutral position limiting functional grip/FM, minimally able to hold walker with palm of R hand for support. Pt requires mod A for bed mobility and STS, frequent verbal cueing and small steps transferring to recliner, wheezing noted and Pt used inhaler. Pt tolerated session well overall, increased time for activities. Pt would benefit from postacute intensive rehab to quickly improve function back to baseline prior to DC, will be seen acutely to maximize progress as able.    Recommendations for follow up therapy are one component of a multi-disciplinary discharge planning process, led by the attending physician.  Recommendations may be updated based on patient status, additional functional criteria and insurance authorization.    Assistance Recommended at Discharge Frequent or constant Supervision/Assistance  Patient can return home with the following  A lot of  help with walking and/or transfers;A lot of help with bathing/dressing/bathroom;Assistance with cooking/housework;Help with stairs or ramp for entrance;Assist for transportation   Equipment Recommendations  None recommended by OT    Recommendations for Other Services      Precautions / Restrictions Precautions Precautions: Fall;Other (comment) Precaution Comments: monitor O2 sats, ambulatory O2 may be needed Restrictions Weight Bearing Restrictions: No       Mobility Bed Mobility Overal bed mobility: Needs Assistance Bed Mobility: Supine to Sit     Supine to sit: Mod assist, HOB elevated     General bed mobility comments: assist to power up and scoot to EOB    Transfers Overall transfer level: Needs assistance Equipment used: Rolling walker (2 wheels) Transfers: Sit to/from Stand, Bed to chair/wheelchair/BSC Sit to Stand: Mod assist, From elevated surface     Step pivot transfers: Mod assist, From elevated surface     General transfer comment: fear or falling, small steps, poor balance     Balance Overall balance assessment: Needs assistance Sitting-balance support: Feet supported, Bilateral upper extremity supported Sitting balance-Leahy Scale: Fair Sitting balance - Comments: EOB   Standing balance support: During functional activity, Reliant on assistive device for balance, Bilateral upper extremity supported Standing balance-Leahy Scale: Poor Standing balance comment: requires RW, unable to safely grip RW with R hand due to stiffness                           ADL either performed or assessed with clinical judgement   ADL Overall ADL's : Needs assistance/impaired                 Upper Body Dressing : Sitting;Moderate assistance   Lower Body Dressing:  Sitting/lateral leans;Maximal assistance   Toilet Transfer: Moderate assistance;Stand-pivot   Toileting- Clothing Manipulation and Hygiene: Moderate assistance;Sit to/from stand        Functional mobility during ADLs: Moderate assistance;Rolling walker (2 wheels) General ADL Comments: Pt requires significant assistance for STS and transfer to recliner, not able to assist with LB dressing, limited use of RUE    Extremity/Trunk Assessment Upper Extremity Assessment Upper Extremity Assessment: Generalized weakness;RUE deficits/detail RUE Deficits / Details: RUE with very loose gross grasp, has small/limited shoulder movement but notes 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 Pt cannot remember timeframes for surgeries or impairments. Pt requires increased time to answer questions, constant verbal cueing for sequencing, direction, and reminders during activity.        Exercises      Shoulder Instructions       General Comments      Pertinent Vitals/ Pain       Pain Assessment Pain Assessment: Faces Faces Pain Scale: Hurts a little bit Pain Location: back of neck Pain Descriptors / Indicators: Aching, Discomfort Pain Intervention(s): Monitored during session  Home Living                                          Prior Functioning/Environment              Frequency  Min 1X/week        Progress Toward Goals  OT Goals(current goals can now be found in the care plan section)  Progress towards OT goals: Progressing toward goals  Acute Rehab OT Goals Patient Stated Goal: to improve balance and functional strength OT Goal Formulation: With patient Time For Goal  Achievement: 07/28/23 Potential to Achieve Goals: Fair ADL Goals Pt Will Perform Grooming: with set-up;with supervision;sitting Pt Will Perform Upper Body Dressing: sitting;with supervision;with set-up Pt Will Transfer to Toilet: stand pivot transfer;with min guard assist;bedside commode Additional ADL Goal #1: Pt will verbalize and demonstrate independent use of energy conservation strategies prn during functional activities.  Plan Discharge plan remains appropriate    Co-evaluation                 AM-PAC OT "6 Clicks" Daily Activity     Outcome Measure   Help from another person eating meals?: A Little Help from another person taking care of personal grooming?: A Little Help from another person toileting, which includes using toliet, bedpan, or urinal?: A Lot Help from another person bathing (including washing, rinsing, drying)?: A Lot Help from another person to put on and taking off regular upper body clothing?: A Lot Help from another person to put on and taking off regular lower body clothing?: A Lot 6 Click Score: 14    End of Session Equipment Utilized During Treatment: Gait belt;Rolling  walker (2 wheels)  OT Visit Diagnosis: Unsteadiness on feet (R26.81);Other abnormalities of gait and mobility (R26.89);History of falling (Z91.81);Muscle weakness (generalized) (M62.81)   Activity Tolerance Patient tolerated treatment well   Patient Left in chair;with chair alarm set;with call bell/phone within reach   Nurse Communication Mobility status        Time: 4098-1191 OT Time Calculation (min): 40 min  Charges: OT General Charges $OT Visit: 1 Visit OT Treatments $Self Care/Home Management : 8-22 mins $Therapeutic Activity: 23-37 mins  Kreg Earhart, OTR/L   Kenly Henckel R Baker Kogler 07/17/2023, 11:40 AM

## 2023-07-17 NOTE — TOC Initial Note (Signed)
Transition of Care [facility]) - Initial/Assessment Note    Patient Details  Name: Kent Flowers MRN: [id number] Date of Birth: 1945-12-27  Transition of Care [facility]) CM/SW Contact:    Erin Sons, LCSW Phone Number: 07/17/2023, 3:52 PM  Clinical Narrative:                  CSW is informed by CIR that son is requesting info on ALF as family would like pt to be placed following rehab. Pt would need ALF to be arranged or a plan for 24/7 supervision at home to be admitted to CIR.   CSW called pt's son who included his wife in on the call. They would like an ALF closer to them in [place]. CSW provided a contact for [facility] to assist with ALF placement. CSW explained that ALF is unlikely to be arranged before pt is discharged from hospital. CSW explained CIR requirement for supervision to be in place. Son and his wife explain they would be able to arrange 24/7 supervision at home if ALF placement is not able to be finalized when pt completes rehab.   CSW updated CIR admissions.   Expected Discharge Plan: IP Rehab Facility Barriers to Discharge: Insurance Authorization   Patient Goals and CMS Choice      CIR then ALF       Living arrangements for the past 2 months: Independent Living Facility, Single Family Home                                      Prior Living Arrangements/Services Living arrangements for the past 2 months: Independent Living Facility, Single Family Home Lives with:: Self                   Activities of Daily Living      Permission Sought/Granted                  Emotional Assessment              Admission diagnosis:  COPD exacerbation (HCC) [J44.1] Strain of neck muscle, initial encounter [S16.1XXA] Acute on chronic respiratory failure with hypoxia (HCC) [J96.21] Acute hypoxic respiratory failure (HCC) [J96.01] Patient Active Problem List   Diagnosis Date Noted   Cervical strain 07/15/2023   Acute on chronic  respiratory failure with hypoxia (HCC) 07/14/2023   COPD exacerbation (HCC) 07/13/2023   Gross hematuria 07/13/2023   Bilateral leg edema 07/13/2023   Abnormal magnetic resonance cholangiopancreatography (MRCP)    Biliary sludge    Mild late onset Alzheimer's dementia without behavioral disturbance, psychotic disturbance, mood disturbance, or anxiety (HCC) 09/18/2022   Acute encephalopathy 09/17/2022   Impaired cognition 03/23/2022   Recurrent falls 01/20/2022   Tobacco use 01/20/2022   Alcohol use 01/20/2022   Acute renal failure superimposed on stage 3a chronic kidney disease (HCC) 01/20/2022   Anemia of chronic disease 01/20/2022   Dens fracture (HCC) 01/19/2022   Community acquired pneumonia 01/19/2022   Choledocholithiasis 11/03/2020   Diskitis 05/10/2020   Urinary incontinence 03/24/2020   Fecal incontinence 03/24/2020   Epidural abscess    Sepsis (HCC) 02/04/2020   Leg swelling 02/04/2020   Vertebral osteophyte 02/04/2020   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   Pulmonary mass 01/22/2020   PICC (peripherally inserted central catheter) in place 01/22/2020   Body mass index (BMI) 27.0-27.9, adult 01/16/2020   Elevated blood-pressure reading, without  diagnosis of hypertension 01/16/2020   Paresthesia of right upper extremity 12/18/2019   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/2019   Status post laminectomy 12/18/2019   Insomnia 09/22/2019   Neck pain 11/04/2017   Other social stressor 07/19/2016   COPD with acute bronchitis (HCC) 10/04/2014   Hepatitis, unspecified 03/13/2014   COPD mixed type (HCC)    Chronic low back pain    Knee pain, bilateral    Right knee DJD    Hepatitis 11/03/2013   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 [place] [address] Phone: [phone] Fax: [phone]     Social Determinants of Health (SDOH) Social History: SDOH Screenings   Food Insecurity: Food Insecurity Present (03/02/2020)  Housing: Low Risk  (03/02/2020)  Recent Concern: Housing - Medium Risk (02/20/2020)  Transportation Needs: No Transportation Needs (03/02/2020)  Alcohol Screen: Low Risk  (03/02/2020)  Depression (PHQ2-9): Low Risk  (03/16/2021)  Financial Resource Strain: Low Risk  (03/02/2020)  Physical Activity: Inactive (03/02/2020)  Social Connections: Unknown (05/02/2022)   Received from [facility]  Stress: Stress Concern Present (03/02/2020)  Tobacco Use: Medium Risk (07/13/2023)   SDOH Interventions:     Readmission Risk Interventions     No data to display

## 2023-07-17 NOTE — Consult Note (Addendum)
Physical Medicine and Rehabilitation Consult Reason for Consult:debility and poor balance Referring Physician: Manson Passey   HPI: Kent Flowers is a 77 y.o. male with a history of DISH of C-spine, COPD, chronic pain, etoh abuse who had been declining physically for a week or so before presenting on 07/13/23 after a fall with shortness of breath, cough, and gross hematuria. Bladder scans revealed retention and pt was cathed for over 400 cc of bloody urine with clots. Urology recommended foley place and flomax. CTA of chest negative for PE but suggestive of bilateral lower lobe infiltrates. Patient was placed on ceftriaxone initially and later azithromycin. UA couldn't completely be tested due to blood and ucx was negative. Pt's breathing has improved to an extent. Pt was initially placed on CIWA protocol for pain he's managed with oxycodone 15mg  qid, scheduled tylenol and lidocaine patches. Pt was up yesterday with PT and was mod assist for sit-std transfers and walked 15' using RW at min assist level with shuffling gait pattern, frequently freezing per report. Pt lives in his home alone and has a PCA 4 hours per day 7 days per week. Family also in area. He used a RW for gait prior to this hospitalization.   Review of Systems  Constitutional:  Negative for fever.  HENT:  Negative for hearing loss.   Eyes:  Negative for blurred vision.  Respiratory:  Positive for cough and shortness of breath.   Cardiovascular: Negative.   Gastrointestinal: Negative.   Genitourinary:  Positive for hematuria.  Musculoskeletal:  Positive for falls, myalgias and neck pain.  Skin:  Negative for rash.  Neurological:  Positive for weakness.  Psychiatric/Behavioral:  Positive for memory loss. Negative for depression.    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   Wears glasses    Past Surgical History:  Procedure Laterality Date   CYSTOSCOPY WITH BIOPSY N/A 04/10/2017   Procedure: POSSIBLE BLADDER  BIOPSY;  Surgeon: Jerilee Field, MD;  Location: [facility];  Service: Urology;  Laterality: N/A;   CYSTOSCOPY WITH URETHRAL DILATATION N/A 04/10/2017   Procedure: CYSTOSCOPY WITH BALLOON URETHRALSTRICTURE DILATATION;  Surgeon: Jerilee Field, MD;  Location[facility];  Service: Urology;  Laterality: N/A;   ENDOSCOPIC RETROGRADE CHOLANGIOPANCREATOGRAPHY (ERCP)  WITH PROPOFOL N/A 09/20/2022   Procedure: ENDOSCOPIC RETROGRADE CHOLANGIOPANCREATOGRAPHY (ERCP) WITH PROPOFOL;  Surgeon: Meryl Dare, MD;  Location: [facility] ENDOSCOPY;  Service: Gastroenterology;  Laterality: N/A;   PENILE BIOPSY N/A 03/14/2013   Procedure: PENILE BIOPSY;  Surgeon: Antony Haste, MD;  Location: [facility];  Service: Urology;  Laterality: N/A;   REATTACHMENT LEFT INDEX FINGER  1988   REMOVAL OF STONES   09/20/2022   Procedure: REMOVAL OF STONES;  Surgeon: Meryl Dare, MD;  Location: [facility] ENDOSCOPY;  Service: Gastroenterology;;   SHOULDER ARTHROSCOPY WITH ROTATOR CUFF REPAIR AND SUBACROMIAL DECOMPRESSION Left 2004   THORACIC LAMINECTOMY FOR EPIDURAL ABSCESS N/A 12/18/2019   Procedure: THORACIC LAMINECTOMY FOR EPIDURAL ABSCESS Thoracic Two, Thoracic Three;  Surgeon: Jadene Pierini, MD;  Location: [facility] OR;  Service: Neurosurgery;  Laterality: N/A;  THORACIC LAMINECTOMY FOR EPIDURAL ABSCESS Thoracic Two, Thoracic Three   TONSILLECTOMY  AS CHILD   Family History  Problem Relation Age of Onset   Emphysema Mother    Cancer Father        bile duct   Social History:  reports that he quit smoking about 13 years ago. His smoking use included cigarettes. He started smoking about 25 years ago. He has a 12 pack-year smoking history. He has never used smokeless tobacco. He reports current alcohol use of about 15.0 standard drinks of alcohol per week. He reports that he does not use drugs. Allergies:  Allergies  Allergen Reactions   Ceclor [Cefaclor] Rash    Tolerates ceftriaxone and cefazolin   Sulfa Antibiotics Other (See Comments) and Rash    SEVERE RASH- childhood allergy  SEVERE RASH, Childhood allergy, Other reaction(s): Rash, SEVERE RASH- childhood allergy   Medications Prior to Admission  Medication Sig Dispense Refill   acetaminophen (TYLENOL) 325 MG tablet Take 650 mg by mouth daily.     albuterol (VENTOLIN HFA) 108 (90 Base) MCG/ACT inhaler Inhale 2 puffs into the lungs every 6 (six) hours as needed for wheezing or shortness of breath. 8.5 each 12   aspirin EC 81 MG tablet Take 1 tablet (81 mg total) by mouth daily. Swallow whole. 90 tablet 4   cyanocobalamin (VITAMIN B12) 1000 MCG tablet Take 1 tablet (1,000 mcg total) by mouth daily. 90 tablet 3   donepezil (ARICEPT) 10 MG tablet TAKE 1 TABLET BY MOUTH AT BEDTIME 30 tablet 9   nitrofurantoin, macrocrystal-monohydrate, (MACROBID) 100  MG capsule Take 100 mg by mouth 2 (two) times daily.     oxyCODONE (ROXICODONE) 15 MG immediate release tablet Take 1 tablet (15 mg total) by mouth 5 (five) times daily as needed for pain. (Patient taking differently: Take 15 mg by mouth in the morning, at noon, in the evening, and at bedtime.) 15 tablet 0   QUEtiapine (SEROQUEL) 25 MG tablet Take 1 tablet (25 mg total) by mouth 2 (two) times daily. 60 tablet 6   tamsulosin (FLOMAX) 0.4 MG CAPS capsule Take 0.4 mg by mouth at bedtime.     TRELEGY ELLIPTA 200-62.5-25 MCG/ACT AEPB Inhale 1 puff into the lungs daily.      Home: Home Living Family/patient expects to be discharged to:: Private residence Living Arrangements: Alone Available Help at Discharge: Family, Available PRN/intermittently, Personal care attendant (son confirms 4 hours per day 7 days per week) Type of Home: House Home Access: Stairs to enter [employer] of Steps: 4 Entrance Stairs-Rails: Left Home Layout: One level Bathroom Shower/Tub: Health visitor: Handicapped height Bathroom Accessibility: Yes  Home Equipment: Agricultural consultant (2 wheels), Shower seat, Grab bars - tub/shower, BSC/3in1 Additional Comments: 2-3 falls in the last year due to losing balance  Functional History: Prior Function Prior Level of Function : Needs assist, History of Falls (last six months) Physical Assist : Mobility (physical) Mobility (physical): Bed mobility Mobility Comments: has difficulty with high bed lifting both legs back into bed, has a bedrail. ADLs Comments: reports indep with use of shower seat, grab bars. Ind with mediactions, has assist with cooking Functional Status:  Mobility: Bed Mobility Overal bed mobility: Needs Assistance Bed Mobility: Supine to Sit Supine to sit: Mod assist, HOB elevated General bed mobility comments: Pt was recieved in recliner and returned to recliner at end of session. Transfers Overall transfer level: Needs  assistance Equipment used: Rolling walker (2 wheels) Transfers: Sit to/from Stand, Bed to chair/wheelchair/BSC Sit to Stand: Mod assist, Total assist Bed to/from chair/wheelchair/BSC transfer type:: Step pivot Step pivot transfers: Mod assist, Total assist General transfer comment: fear of falling, Mod assist for initial momentum to get to standing from recliner and BSC. Pt requires Max A for standing to sitting requiring heavy multi modal cueing for AD and safe turns prior to sitting. Pt started leaning back heavily and yelling "no, no, no" Total assist to recliner at end fo session. Pt then stated " Don't ever do that I was in complete control." Pt was educated that he was not assisted into recliner until he was already falling posteriorly and yelling out. Ambulation/Gait Ambulation/Gait assistance: Min assist Gait Distance (Feet): 15 Feet (15 ft 2x from recliner to [facility] in bathroom) Assistive device: Rolling walker (2 wheels) Gait Pattern/deviations: Step-through pattern, Shuffle General Gait Details: Pt with parkinsonian-like gait pattern, shuffles, freezes and improved with verbal cues for big steps. Pt tends to try to keep COM posterior to BOS Gait velocity: decreased cadence Gait velocity interpretation: <1.31 ft/sec, indicative of household ambulator    ADL: ADL Overall ADL's : Needs assistance/impaired Eating/Feeding: Set up, Bed level Grooming: Sitting, Wash/dry face, Min guard Upper Body Bathing: Sitting, Minimal assistance Lower Body Bathing: Sitting/lateral leans, Maximal assistance Upper Body Dressing : Sitting, Moderate assistance Lower Body Dressing: Sitting/lateral leans, Maximal assistance Toilet Transfer: Moderate assistance, Stand-pivot Toileting- Clothing Manipulation and Hygiene: Moderate assistance, Sit to/from stand Functional mobility during ADLs: Moderate assistance, Rolling walker (2 wheels) General ADL Comments: Pt only able to ambulate 5-7 steps to get to  recliner before DOE, loses balance posteriorly and needs assist to maintain  Cognition: Cognition Overall Cognitive Status: History of cognitive impairments - at baseline Orientation Level: Disoriented to situation, Oriented to person, Oriented to place, Oriented to time Cognition Arousal/Alertness: Awake/alert Behavior During Therapy: [facility] for tasks assessed/performed Overall Cognitive Status: History of cognitive impairments - at baseline General Comments: poor safety awareness.  Blood pressure 125/85, pulse 75, temperature 98.7 F (37.1 C), temperature source Oral, resp. rate 16, height 5\' 7"  (1.702 m), weight 83.5 kg, SpO2 95%. Physical Exam Constitutional:      Appearance: He is not ill-appearing.  HENT:     Head: Normocephalic and atraumatic.     Right Ear: There is no impacted cerumen.     Mouth/Throat:     Mouth: Mucous membranes are moist.  Eyes:     Conjunctiva/sclera: Conjunctivae normal.  Cardiovascular:     Rate and Rhythm: Normal rate.  Pulmonary:     Effort: Pulmonary effort is normal.  Abdominal:     Palpations: Abdomen is soft.  Genitourinary:    Comments:  Cranberry colored urine in foley bag Musculoskeletal:     Cervical back: Rigidity and tenderness present.     Right lower leg: No edema.     Left lower leg: No edema.  Skin:    General: Skin is warm.     Findings: Bruising present.  Neurological:     Mental Status: He is alert.     Comments: Pt alert and oriented to person, place, hospital, month and year. Provides biographical information. Mild STM deficits. Reasonable insight and awareness. CN exam non-focal. Somewhat of a flat facies. Resting tremors R>LUE's. RUE is somewhat rigid with cogwheeling. Strength grossly 4- RUE, R LUE and 3- to 3+ prox LE to 4/5 distally. Sensed pain and light touch in all 4 limbs. DTR's trace  Psychiatric:     Comments: Pt generally pleasant and cooperative     Results for orders placed or performed during the hospital  encounter of 07/13/23 (from the past 24 hour(s))  Basic metabolic panel     Status: Abnormal   Collection Time: 07/17/23 12:23 AM  Result Value Ref Range   Sodium 136 135 - 145 mmol/L   Potassium 4.4 3.5 - 5.1 mmol/L   Chloride 101 98 - 111 mmol/L   CO2 26 22 - 32 mmol/L   Glucose, Bld 163 (H) 70 - 99 mg/dL   BUN 34 (H) 8 - 23 mg/dL   Creatinine, Ser 1.47 (H) 0.61 - 1.24 mg/dL   Calcium 8.4 (L) 8.9 - 10.3 mg/dL   GFR, Estimated 44 (L) >60 mL/min   Anion gap 9 5 - 15  CBC     Status: Abnormal   Collection Time: 07/17/23 12:23 AM  Result Value Ref Range   WBC 11.6 (H) 4.0 - 10.5 K/uL   RBC 3.79 (L) 4.22 - 5.81 MIL/uL   Hemoglobin 11.3 (L) 13.0 - 17.0 g/dL   HCT 82.9 (L) 56.2 - 13.0 %   MCV 93.1 80.0 - 100.0 fL   MCH 29.8 26.0 - 34.0 pg   MCHC 32.0 30.0 - 36.0 g/dL   RDW 86.5 78.4 - 69.6 %   Platelets 317 150 - 400 K/uL   nRBC 0.0 0.0 - 0.2 %   No results found.  Assessment/Plan: Diagnosis: 77 yo male with history of falls who declined the week prior to 7/26 when he presented to hospital with hematuria/urine retention as well as bilateral lower lobe pneumonia.  Does the need for close, 24 hr/day medical supervision in concert with the patient's rehab needs make it unreasonable for this patient to be served in a less intensive setting? 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 Yes Does the patient require coordinated care of a physician, rehab nurse, therapy disciplines of PT, OT to address physical and functional deficits in the context of the above medical diagnosis(es)? Yes Addressing deficits in the following areas:  balance, endurance, locomotion, strength, transferring, bowel/bladder control, bathing, dressing, feeding, grooming, toileting, cognition, and psychosocial support Can the patient actively participate in an intensive therapy program of at least 3 hrs of therapy per day at least 5 days per week? Yes The potential for patient to make measurable gains while on inpatient rehab is excellent Anticipated functional outcomes upon discharge from inpatient rehab are modified independent  and supervision  with PT, modified independent and supervision with OT, n/a with SLP. Estimated rehab length of stay to reach the above functional goals is: 9-15 days Anticipated discharge destination: Home Overall Rehab/Functional Prognosis: good  POST ACUTE RECOMMENDATIONS: This patient's condition is appropriate for continued rehabilitative care in the following setting:  inpatient rehab Patient has agreed to participate in recommended program. Yes Note that insurance prior authorization may be required for reimbursement for recommended care.  Comment: Pt has a aide at home 4 hours per day. The remainder of the time he's home alone. I believe he will need more than 4 hours per day of supervision at home given his diagnoses and future fall-risk. A discussion needs to take place between patient, son, and caregivers to see how feasible increasing assistance at home might be. Rehab Admissions Coordinator to follow up      I have personally performed a face to face diagnostic evaluation of this patient. Additionally, I have examined the patient's medical record including any pertinent labs and radiographic images. If the physician assistant has documented in this note, I have reviewed and edited or otherwise concur with the physician assistant's documentation.  Thanks,  Kent Oyster, MD 07/17/2023

## 2023-07-17 NOTE — Progress Notes (Signed)
Daily Progress Note Intern Pager: [phone]  Patient name: Kent Flowers [facility] Medical record number: [id number] Date of birth: [birth year] Age: 77 y.o. Gender: male  Primary Care Provider: Earnest Rosier, MD Consultants: Urology  Code Status: DNR   Pt Overview and Major Events to Date:  7/29- admitted 7/30-   Assessment and Plan: Dravon Younkin is a 77 yo M with PMHx of DISH of C spine, COPD not on home oxygen, and chronic pain with narcotic pain medications who was admitted after a fall w neck pain, increased WOB with increased sputum production, and gross hematuria.  [facility]     * (Principal) Gross hematuria     Had urinary retention with >400 mL on 7/28 PM. Urology placed 16 F  catheter on 7/28. Urine culture negative.  - Urology consulted, appreciate recommendations  - Foley to remain in place for 5-7 days prior to attempting voiding trial  (7/28 - 8/1 to 8/3)  - Continue Flomax 0.4 mg daily  - Outpatient fu with [facility] for likely cystoscopy - Hold Lovenox for now  - ordered SCDs - daily CBC - will need outpatient evaluation for cause  - Will discuss with family and patient why on aspirin (if primary  prevention can discontinue)         Recurrent falls     Multifactorial: sepsis 2/2 to UTI, poor balance in the setting of  reduced neck range of motion, dehydration.  PT/OT recommending CIR -Hopeful for CIR  - Fall and delirium precautions        Alcohol use     Reports drinking 2-3 drinks poor day but is not a great historian. CIWA  4-5 ON.  - continue supplements, CIWAS discontinued         Acute renal failure superimposed on stage 3a chronic kidney disease  (HCC)     Stable today around baseline (1.62)  - continue to monitor  - I/Os        Acute on chronic respiratory failure with hypoxia (HCC)     Likely due to CAP, improving, still requiring O2 with walking.  - Ceftriaxone (7/26-7/28 ) and Azithromycin (7/27- 7/29) - Will complete  course on Cefdinir (end date 7/31)  - S/p solumedrol 125mg  with EMS 7/26 - Prednisone 40mg  (7/27 - 7/30) - Continue home Trelegy  - Continue home Albuterol PRN Q4H - Previously recommended to use home oxygen  - Walk test today to see if needs oxygen when walking today      FEN/GI: Regular  PPx: SCDs  Dispo:Patient is medically stable for discharge. Pending CIR acceptance.   Subjective:  Patient reports he is feeling better today. He reports some neck pain but at his baseline. He reports he has no pain or difficulty urinating.   Objective: Temp:  [98.5 F (36.9 C)-98.7 F (37.1 C)] 98.7 F (37.1 C) (07/30 0851) Pulse Rate:  [73-86] 75 (07/30 0851) Resp:  [16-21] 16 (07/30 0851) BP: (125-159)/(83-86) 125/85 (07/30 0851) SpO2:  [95 %-96 %] 95 % (07/30 0851) Physical Exam: General: age appropriate, NAD Cardiovascular: RRR, no m/r/g Respiratory: saturating well, coarse breath sounds bilaterally  Abdomen: soft, non tender, non distended GU: foley in place, urine is pinot noir colored  Extremities: decreased ROM on R arm   Laboratory: Most recent CBC Lab Results  Component Value Date   WBC 11.6 (H) 07/17/2023   HGB 11.3 (L) 07/17/2023   HCT 35.3 (L) 07/17/2023  MCV 93.1 07/17/2023   PLT 317 07/17/2023   Most recent BMP    Latest Ref Rng & Units 07/17/2023   12:23 AM  BMP  Glucose 70 - 99 mg/dL 161   BUN 8 - 23 mg/dL 34   Creatinine 0.96 - 1.24 mg/dL 0.45   Sodium 409 - 811 mmol/L 136   Potassium 3.5 - 5.1 mmol/L 4.4   Chloride 98 - 111 mmol/L 101   CO2 22 - 32 mmol/L 26   Calcium 8.9 - 10.3 mg/dL 8.4      Imaging/Diagnostic Tests: No new imaging  Penne Lash, MD 07/17/2023, 2:14 PM  PGY-1, [facility] FPTS Intern pager: [phone], text pages welcome Secure chat group [facility] Teaching Service

## 2023-07-18 DIAGNOSIS — R310 Gross hematuria: Secondary | ICD-10-CM | POA: Diagnosis not present

## 2023-07-18 LAB — CBC
HCT: 35.7 % — ABNORMAL LOW (ref 39.0–52.0)
Hemoglobin: 11.8 g/dL — ABNORMAL LOW (ref 13.0–17.0)
MCH: 30.9 pg (ref 26.0–34.0)
MCHC: 33.1 g/dL (ref 30.0–36.0)
MCV: 93.5 fL (ref 80.0–100.0)
Platelets: 296 10*3/uL (ref 150–400)
RBC: 3.82 MIL/uL — ABNORMAL LOW (ref 4.22–5.81)
RDW: 14.5 % (ref 11.5–15.5)
WBC: 13 10*3/uL — ABNORMAL HIGH (ref 4.0–10.5)
nRBC: 0 % (ref 0.0–0.2)

## 2023-07-18 LAB — GLUCOSE, CAPILLARY: Glucose-Capillary: 155 mg/dL — ABNORMAL HIGH (ref 70–99)

## 2023-07-18 MED ORDER — ENOXAPARIN SODIUM 40 MG/0.4ML IJ SOSY
40.0000 mg | PREFILLED_SYRINGE | INTRAMUSCULAR | Status: DC
  Administered 2023-07-18: 40 mg via SUBCUTANEOUS
  Filled 2023-07-18: qty 0.4

## 2023-07-18 NOTE — Progress Notes (Signed)
     Subjective: Kent Flowers. Pt up in chair.  Objective: Vital signs in last 24 hours: Temp:  [97.8 F (36.6 C)-98.5 F (36.9 C)] 98.5 F (36.9 C) (07/31 0913) Pulse Rate:  [61-79] 63 (07/31 0913) Resp:  [17-20] 17 (07/31 0913) BP: (114-136)/(67-75) 114/70 (07/31 0913) SpO2:  [91 %-97 %] 91 % (07/31 0913)  Intake/Output from previous day: 07/30 0701 - 07/31 0700 In: -  Out: 3250 [Urine:3250]  Intake/Output this shift: Total I/O In: -  Out: 600 [Urine:600]  Physical Exam:  General: Alert and oriented CV: No cyanosis Lungs: equal chest rise Gu: foley catheter in place with light red urine.   Lab Results: Recent Labs    07/15/23 1842 07/16/23 0050 07/17/23 0023  HGB 12.7* 11.3* 11.3*  HCT 40.2 35.2* 35.3*   BMET Recent Labs    07/16/23 0050 07/17/23 0023  NA 135 136  K 4.5 4.4  CL 99 101  CO2 24 26  GLUCOSE 129* 163*  BUN 35* 34*  CREATININE 1.61* 1.62*  CALCIUM 8.6* 8.4*     Studies/Results: No results found.  Assessment/Plan: # Hemorrhagic cystitis Patient was being treated with ABX and had been experiencing gross hematuria for 1 week prior to admission to the hospital.  Difficult Foley placement by Dr. Ronne Binning.  77f coud catheter with 400 cc brown bloody urine returned on 7/28. Foley to remain in place for 5-7 days prior to attempting voiding trial. UOP , Scr stable Bleeding is less today. Will continue to follow along   LOS: 3 days   Elmon Kirschner, NP [facility] Pager: [phone]  07/18/2023, 11:29 AM

## 2023-07-18 NOTE — Progress Notes (Signed)
Nurse requested Mobility Specialist to perform oxygen saturation test with pt which includes removing pt from oxygen both at rest and while ambulating.  Below are the results from that testing.     Patient Saturations on Room Air at Rest = spO2 95%   Patient Saturations on Room Air while Ambulating = sp02 91% .    Patient Saturations on N/A Liters of oxygen while Ambulating = sp02 N/A%  At end of testing pt left in room on RA.  Reported results to nurse.   Maurene Capes Mobility Specialist Please contact via SecureChat or Rehab office at [phone]

## 2023-07-18 NOTE — Assessment & Plan Note (Signed)
Cr at baseline.  - continue to monitor  - I/Os

## 2023-07-18 NOTE — Progress Notes (Signed)
Inpatient Rehab Admissions Coordinator:   Spoke to pt's son, Gladstone Pih.  They are working on potential transition from [employer], but are prepared for pt to stay with him and his spouse temporarily if he is unable to directly transition to ALF.  I've requested insurance auth be started and we will follow for determination.   Estill Dooms, PT, DPT Admissions Coordinator [phone] 07/18/23  10:12 AM

## 2023-07-18 NOTE — Progress Notes (Signed)
Mobility Specialist: Progress Note   07/18/23 1608  Mobility  Activity Transferred to/from [facility]  Level of Assistance Minimal assist, patient does 75% or more  Assistive Device Front wheel walker  Distance Ambulated (ft) 3 ft  Activity Response Tolerated fair  Mobility Referral Yes  $Mobility charge 1 Mobility  Mobility Specialist Start Time (ACUTE ONLY) 1545  Mobility Specialist Stop Time (ACUTE ONLY) 1600  Mobility Specialist Time Calculation (min) (ACUTE ONLY) 15 min   Pt was agreeable to mobility session - received in chair. MinA for STS, but deferred ambulation d/t pt needing to have BM. MinA for transfer. Left on BSC, NT and RN notified. Call bell in reach.   VSS on RA.   Maurene Capes Mobility Specialist Please contact via SecureChat or Rehab office at [phone]

## 2023-07-18 NOTE — Discharge Instructions (Addendum)
Dear Kent Flowers,   Thank you so much for allowing [place] to be part of your care!  You were admitted to [facility] for blood in your urine and shortness of breath after a fall. You were given steroids here and urology placed a foley for you.    POST-HOSPITAL & CARE INSTRUCTIONS Please let PCP/Specialists know of any changes that were made.  Please see medications section of this packet for any medication changes.   DOCTOR'S APPOINTMENT & FOLLOW UP CARE INSTRUCTIONS  Future Appointments  Date Time Provider Department Center  01/08/2024  1:30 PM Waymon Budge, MD [facility] None  05/08/2024  2:45 PM Windell Norfolk, MD [facility] None    RETURN PRECAUTIONS: Fever, worsening shortness of breath  Take care and be well!  Family Medicine Teaching Service  [place]  [facility]  [address] [place], [place] [address] [phone]

## 2023-07-18 NOTE — Assessment & Plan Note (Addendum)
Had urinary retention with >400 mL on 7/28 PM. Urology placed 16 F catheter on 7/28. Urine culture negative. Urine is color of pinot noir - Urology consulted, appreciate recommendations  - Foley to remain in place for 5-7 days prior to attempting voiding trial (7/28 - 8/1 to 8/3)  - Continue Flomax 0.4 mg daily  - Outpatient fu with [facility] for likely cystoscopy - Hold Lovenox for now  - ordered SCDs - daily CBC - will need outpatient evaluation for cause

## 2023-07-18 NOTE — Assessment & Plan Note (Addendum)
Likely due to CAP, improving. S/P Ceftriaxone (7/26-7/28 ) and Azithromycin (7/27- 7/29). -S/p solumedrol 125mg  with EMS 7/26 and completed Prednisone 40mg  (7/27 - 7/30). Patient asking for inhaler overnight. - ordered albuterol nebs  - Will complete course on Cefdinir (end date 7/31)  - Continue home Trelegy  - Continue home Albuterol PRN Q4H - Previously recommended to use home oxygen  - Walk test today to see if needs oxygen when walking today

## 2023-07-18 NOTE — Progress Notes (Signed)
Daily Progress Note Intern Pager: [phone]  Patient name: Kent Flowers [facility] Medical record number: [id number] Date of birth: 1946-09-12 Age: 77 y.o. Gender: male  Primary Care Provider: Earnest Rosier, MD Consultants: Urology  Code Status: DNR    Pt Overview and Major Events to Date:  7/29- admitted 7/30- accepted to CIR  Assessment and Plan: Canyon Langston is a 77 yo M with PMHx of DISH of C spine, COPD not on home oxygen, and chronic pain with narcotic pain medications who was admitted after a fall w neck pain, increased WOB with increased sputum production, and gross hematuria.  [facility]     * (Principal) Gross hematuria     Had urinary retention with >400 mL on 7/28 PM. Urology placed 16 F  catheter on 7/28. Urine culture negative. Urine is color of pinot noir - Urology consulted, appreciate recommendations  - Foley to remain in place for 5-7 days prior to attempting voiding trial  (7/28 - 8/1 to 8/3)  - Continue Flomax 0.4 mg daily  - Outpatient fu with [facility] for likely cystoscopy - Hold Lovenox for now  - ordered SCDs - daily CBC - will need outpatient evaluation for cause         Recurrent falls     Multifactorial: sepsis 2/2 to UTI, poor balance in the setting of  reduced neck range of motion, dehydration. Goal for CIR.  - Fall and delirium precautions        Alcohol use     Reports drinking 2-3 drinks poor day but is not a great historian. D/C  CIWAs.  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  - continue to monitor  - I/Os          Acute on chronic respiratory failure with hypoxia (HCC)     Likely due to CAP, improving. S/P Ceftriaxone (7/26-7/28 ) and  Azithromycin (7/27- 7/29). -S/p solumedrol 125mg  with EMS 7/26 and  completed Prednisone 40mg  (7/27 - 7/30). Patient asking for inhaler  overnight. - ordered albuterol nebs  - Will complete  course on Cefdinir (end date 7/31)  - Continue home Trelegy  - Continue home Albuterol PRN Q4H - Previously recommended to use home oxygen  - Walk test today to see if needs oxygen when walking today      Chronic and Stable Medical Conditions:  Neck Pain/DISH C-spine: continue oxycodone 15 mg QID and prn at bedtime, scheduled tylenol, lidocaine patch  Constipation 2/2 to Opioid Use: patient had episode of incontinent stool- monitor dosing of Miralax and Senna 7/29 to BID  Lymphedema: continue TED hose  Dementia: continue Seroquel 25 gm daily at bedtime, continue Aricept 10 mg daily at bedtime. Primary prevention- continue asa 81mg     FEN/GI: Regular  PPx: SCDs - will repeat CBC and if stable, start pharmacological VTE ppx Dispo: Patient is medically stable for discharge. Pending CIR auth  Subjective:  She reports he is not doing well this morning as he has not received his morning pain medications.  He reports that he feels like his breathing is better although he does have a little bit of coughing still.  He reports that he did have bowel movements yesterday.  He states that otherwise he has no complaints at this time.  Objective: Temp:  [97.8 F (36.6 C)-98.5 F (  36.9 C)] 98.5 F (36.9 C) (07/31 0913) Pulse Rate:  [61-79] 63 (07/31 0913) Resp:  [17-20] 17 (07/31 0913) BP: (114-136)/(67-75) 114/70 (07/31 0913) SpO2:  [91 %-97 %] 91 % (07/31 0913) Physical Exam: General: Comfortable in bed eating breakfast, no acute distress Cardiovascular: Regular rate and rhythm no murmurs rubs or gallops Respiratory: Work of breathing, slightly coarse breath sounds bilaterally Abdomen: Slightly distended, soft, normal bowel sounds Extremities: No lower extremity edema  Laboratory: Most recent CBC Lab Results  Component Value Date   WBC 11.6 (H) 07/17/2023   HGB 11.3 (L) 07/17/2023   HCT 35.3 (L) 07/17/2023   MCV 93.1 07/17/2023   PLT 317 07/17/2023   Most recent BMP    Latest Ref  Rng & Units 07/17/2023   12:23 AM  BMP  Glucose 70 - 99 mg/dL 478   BUN 8 - 23 mg/dL 34   Creatinine 2.95 - 1.24 mg/dL 6.21   Sodium 308 - 657 mmol/L 136   Potassium 3.5 - 5.1 mmol/L 4.4   Chloride 98 - 111 mmol/L 101   CO2 22 - 32 mmol/L 26   Calcium 8.9 - 10.3 mg/dL 8.4     Imaging/Diagnostic Tests: No new imaging Penne Lash, MD 07/18/2023, 11:25 AM  PGY-1, [facility] FPTS Intern pager: [phone], text pages welcome Secure chat group [facility] Teaching Service

## 2023-07-18 NOTE — Plan of Care (Signed)
Patient Kent Flowers. Forgetful. Able to call for needs independently. VSS. RA. Able to tolerate sitting in the chair today. 2 BM this shift. Continues with foley catheter. Urine color remains dark and bloody. No bright red blood noted. No pain reported by patient to catheter or abdomen. Neck pain continues. Lidocaine patch applied as ordered.   Problem: Education: Goal: Knowledge of General Education information will improve Description: Including pain rating scale, medication(s)/side effects and non-pharmacologic comfort measures Outcome: Progressing   Problem: Health Behavior/Discharge Planning: Goal: Ability to manage health-related needs will improve Outcome: Progressing   Problem: Clinical Measurements: Goal: Ability to maintain clinical measurements within normal limits will improve Outcome: Progressing Goal: Will remain free from infection Outcome: Progressing Goal: Diagnostic test results will improve Outcome: Progressing Goal: Respiratory complications will improve Outcome: Progressing Goal: Cardiovascular complication will be avoided Outcome: Progressing   Problem: Activity: Goal: Risk for activity intolerance will decrease Outcome: Progressing   Problem: Nutrition: Goal: Adequate nutrition will be maintained Outcome: Progressing   Problem: Coping: Goal: Level of anxiety will decrease Outcome: Progressing   Problem: Elimination: Goal: Will not experience complications related to bowel motility Outcome: Progressing Goal: Will not experience complications related to urinary retention Outcome: Progressing   Problem: Pain Managment: Goal: General experience of comfort will improve Outcome: Progressing   Problem: Safety: Goal: Ability to remain free from injury will improve Outcome: Progressing   Problem: Skin Integrity: Goal: Risk for impaired skin integrity will decrease Outcome: Progressing

## 2023-07-18 NOTE — Assessment & Plan Note (Signed)
Reports drinking 2-3 drinks poor day but is not a great historian. D/C CIWAs.  - continue supplements

## 2023-07-18 NOTE — Assessment & Plan Note (Signed)
Multifactorial: sepsis 2/2 to UTI, poor balance in the setting of reduced neck range of motion, dehydration. Goal for CIR.  - Fall and delirium precautions

## 2023-07-19 DIAGNOSIS — R310 Gross hematuria: Secondary | ICD-10-CM | POA: Diagnosis not present

## 2023-07-19 MED ORDER — POLYETHYLENE GLYCOL 3350 17 G PO PACK
17.0000 g | PACK | Freq: Every day | ORAL | Status: DC
  Administered 2023-07-20 – 2023-08-07 (×14): 17 g via ORAL
  Filled 2023-07-19 (×19): qty 1

## 2023-07-19 NOTE — Progress Notes (Signed)
     Subjective: NAEON. Pt up in chair.  Objective: Vital signs in last 24 hours: Temp:  [97.8 F (36.6 C)-98.3 F (36.8 C)] 98.2 F (36.8 C) (08/01 0732) Pulse Rate:  [55-72] 60 (08/01 0732) Resp:  [16-17] 16 (08/01 0732) BP: (110-138)/(65-70) 138/67 (08/01 0732) SpO2:  [93 %-95 %] 94 % (08/01 0732)  Intake/Output from previous day: 07/31 0701 - 08/01 0700 In: 240 [P.O.:240] Out: 2600 [Urine:2600]  Intake/Output this shift: Total I/O In: -  Out: 700 [Urine:700]  Physical Exam:  General: Alert and oriented CV: No cyanosis Lungs: equal chest rise Gu: foley catheter in place with light red urine.   Lab Results: Recent Labs    07/17/23 0023 07/18/23 1231  HGB 11.3* 11.8*  HCT 35.3* 35.7*   BMET Recent Labs    07/17/23 0023  NA 136  K 4.4  CL 101  CO2 26  GLUCOSE 163*  BUN 34*  CREATININE 1.62*  CALCIUM 8.4*     Studies/Results: No results found.  Assessment/Plan: # Hemorrhagic cystitis Patient was being treated with ABX and had been experiencing gross hematuria for 1 week prior to admission to the hospital.  Difficult Foley placement by Dr. Ronne Binning.  45f coud catheter with 400 cc brown bloody urine returned on 7/28. TOV with string of bottles today Discussed with primary team this am. They feel he is otherwise ready for discharge. Avoid blood thinners if at all possible.  Will re-evaluate in the a.m. if patient is still in hospital. Follow up in clinic 1-2 weeks for reassessment and PVR. Follow up with Dr. Mena Goes at [facility] in 4-6 weeks for cystoscopy and reassessment. Request sent to [facility] schedulers.    LOS: 4 days   Elmon Kirschner, NP [facility] Pager: [phone]  07/19/2023, 12:15 PM

## 2023-07-19 NOTE — Care Management Important Message (Signed)
Important Message  Patient Details  Name: Kent Flowers MRN: [id number] Date of Birth: 10-Dec-1946   Medicare Important Message Given:  Yes     Sherilyn Banker 07/19/2023, 1:48 PM

## 2023-07-19 NOTE — Assessment & Plan Note (Addendum)
Had urinary retention with >400 mL on 7/28 PM. Urology placed 16 F catheter on 7/28. Urine culture negative. Urine is darker today than yesterday, color of merlot.  - Urology consulted, appreciate recommendations  - Will trial voiding today- q2h bladder checks x3 to ensure patient is voiding  - Continue Flomax 0.4 mg daily  - Outpatient fu with [facility] for likely cystoscopy & evaluation  - hg has been stable, will start lovenox for DVT ppx today  - AM CBC

## 2023-07-19 NOTE — Assessment & Plan Note (Signed)
Multifactorial: sepsis 2/2 to UTI, poor balance in the setting of reduced neck range of motion, dehydration. Goal for CIR.  - Fall and delirium precautions

## 2023-07-19 NOTE — Assessment & Plan Note (Addendum)
Likely due to CAP, improving. S/P Ceftriaxone (7/26-7/28 ), Azithromycin (7/27- 7/29), Cefdinir 4 doses (7/28-7/30) S/p solumedrol 125mg  with EMS 7/26 and completed Prednisone 40mg  (7/27 - 7/30). Patient was able to ambulate without needing oxygen, though was previously recommended to use home oxygen.  - albuterol nebs  - Continue home Trelegy  - Continue home Albuterol PRN Q4H

## 2023-07-19 NOTE — Progress Notes (Signed)
Inpatient Rehab Admissions Coordinator:   Awaiting determination from [facility] Medicare regarding CIR prior auth request.  Will follow.   Estill Dooms, PT, DPT Admissions Coordinator [phone] 07/19/23  11:28 AM

## 2023-07-19 NOTE — Assessment & Plan Note (Signed)
Cr at baseline.  - continue to monitor  - I/Os

## 2023-07-19 NOTE — Plan of Care (Signed)
Patient is AOX3. Forgetful. Able to call for needs appropriately. RA. Expiratory wheezing present. Uses inhaler at bedside. C/O pain to neck. PRN medication effective. Foley catheter was removed today. Patient has been able to void and has minimal amounts of urine noted in bladder scans. As per urology, MD to be notified with bladder scan amounts of 500cc+. Patient was able to ambulate to bedside recliner this shift. Uses walker appropriately. No concerns. VSS.   Problem: Education: Goal: Knowledge of General Education information will improve Description: Including pain rating scale, medication(s)/side effects and non-pharmacologic comfort measures Outcome: Progressing   Problem: Health Behavior/Discharge Planning: Goal: Ability to manage health-related needs will improve Outcome: Progressing   Problem: Clinical Measurements: Goal: Ability to maintain clinical measurements within normal limits will improve Outcome: Progressing Goal: Will remain free from infection Outcome: Progressing Goal: Diagnostic test results will improve Outcome: Progressing Goal: Respiratory complications will improve Outcome: Progressing Goal: Cardiovascular complication will be avoided Outcome: Progressing   Problem: Activity: Goal: Risk for activity intolerance will decrease Outcome: Progressing   Problem: Nutrition: Goal: Adequate nutrition will be maintained Outcome: Progressing   Problem: Coping: Goal: Level of anxiety will decrease Outcome: Progressing   Problem: Elimination: Goal: Will not experience complications related to bowel motility Outcome: Progressing Goal: Will not experience complications related to urinary retention Outcome: Progressing   Problem: Pain Managment: Goal: General experience of comfort will improve Outcome: Progressing   Problem: Safety: Goal: Ability to remain free from injury will improve Outcome: Progressing   Problem: Skin Integrity: Goal: Risk for  impaired skin integrity will decrease Outcome: Progressing

## 2023-07-19 NOTE — Progress Notes (Signed)
Daily Progress Note Intern Pager: [phone]  Patient name: Kent Flowers [facility] Medical record number: [id number] Date of birth: 03/14/1946 Age: 77 y.o. 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 Urology placed 16 F  catheter on 7/28. Urine culture negative. Urine is darker today than  yesterday, color of merlot.  - Urology consulted, appreciate recommendations  - Will trial voiding today- q2h bladder checks x3 to ensure patient is  voiding  - Continue Flomax 0.4 mg daily  - Outpatient fu with [facility] for likely cystoscopy & evaluation  - hg has been stable, will start lovenox for DVT ppx today  - AM CBC            Recurrent falls     Multifactorial: sepsis 2/2 to UTI, poor balance in the setting of  reduced neck range of motion, dehydration. Goal for CIR.  - Fall and delirium precautions        Alcohol use     Reports drinking 2-3 drinks poor day but is not a great historian. D/C  CIWAs.  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  - continue to monitor  - I/Os          Acute on chronic respiratory failure with hypoxia (HCC)     Likely due to CAP, improving. S/P Ceftriaxone (7/26-7/28 ),  Azithromycin (7/27- 7/29), Cefdinir 4 doses (7/28-7/30) S/p solumedrol  125mg  with EMS 7/26 and completed Prednisone 40mg  (7/27 - 7/30). Patient  was  able to ambulate without needing oxygen, though was previously  recommended to use home oxygen.  - albuterol nebs  - Continue home Trelegy  - Continue home Albuterol PRN Q4H       FEN/GI: Regular PPx: Lovenox Dispo:CIR in 2-3 days. Barriers include insurance authorization.   Subjective:  Patient reports not feeling very well today. He states his stomach has been bothering him. He reports he hasn't had diarrhea but did have some increase in stool. He states his breathing is okay and his neck pain is about the same.   Objective: Temp:  [97.8 F (36.6 C)-98.3 F (36.8 C)] 98.2 F (36.8 C) (08/01 0732) Pulse Rate:  [55-72] 60 (08/01 0732) Resp:  [16-17] 16 (08/01 0732) BP: (110-138)/(65-70) 138/67 (08/01 0732) SpO2:  [93 %-95 %] 94 % (08/01 0732) Physical Exam: General: NAD, up to use the bathroom Cardiovascular: RRR, no m/r/g Respiratory: coarse breath sounds bilaterally, unchanged from prior, on RA  Abdomen: soft, nontender, nondistended Extremities: weakness of R upper extremity with decreased grip strength, moving all other extremities appropriately.   Laboratory: Most recent CBC Lab Results  Component Value Date   WBC 13.0 (H) 07/18/2023   HGB 11.8 (L) 07/18/2023   HCT 35.7 (L) 07/18/2023  MCV 93.5 07/18/2023   PLT 296 07/18/2023   Most recent BMP    Latest Ref Rng & Units 07/17/2023   12:23 AM  BMP  Glucose 70 - 99 mg/dL 119   BUN 8 - 23 mg/dL 34   Creatinine 1.47 - 1.24 mg/dL 8.29   Sodium 562 - 130 mmol/L 136   Potassium 3.5 - 5.1 mmol/L 4.4   Chloride 98 - 111 mmol/L 101   CO2 22 - 32 mmol/L 26   Calcium 8.9 - 10.3 mg/dL 8.4     Other pertinent labs    Imaging/Diagnostic Tests: No new imaging  Penne Lash, MD 07/19/2023, 12:58 PM  PGY-1, [place] [facility] FPTS Intern pager: [phone], text pages welcome Secure chat group [facility] Teaching Service

## 2023-07-19 NOTE — Assessment & Plan Note (Signed)
Reports drinking 2-3 drinks poor day but is not a great historian. D/C CIWAs.  - continue supplements

## 2023-07-19 NOTE — Progress Notes (Signed)
Occupational Therapy Treatment Patient Details Name: Kent Flowers MRN: [id number] DOB: [birth year] Today's Date: 07/19/2023   History of present illness 77 y.o. male admitted on 07/13/23 for falls, acute hpoxemia, sepsis, and elevated troponin.  Pt dx with UTI (with hematuria) and possible PNA.  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  Pt with significant PMH of falls, COPD, thoracic laminectomy, L shoulder RTC repair and decompression (2004).   OT comments  Educated pt on the 4ps of energy conservation and the needs for seated rest breaks prn. Attempted to get pt OOB and progress with activity tolerance, pt requesting to eat his meal so OT follow-up with patient at 18:01 (precisely 30 mins after initiating OT session). Pt was still requesting to finish food at the time, once finishing he stated that he "needed to rest" but he agreed to make an attempt to ambulate to doorway after eating; he no longer felt like doing so and felt the desire to rest in bed instead. OT will make efforts to continue to progress pt as able, will make attempts to follow-up with patient sooner in the week to progress activity tolerance and reinforce energy conservation strategies.    Recommendations for follow up therapy are one component of a multi-disciplinary discharge planning process, led by the attending physician.  Recommendations may be updated based on patient status, additional functional criteria and insurance authorization.    Assistance Recommended at Discharge Frequent or constant Supervision/Assistance  Patient can return home with the following  A lot of help with walking and/or transfers;A lot of help with bathing/dressing/bathroom;Assistance with cooking/housework;Help with stairs or ramp for entrance;Assist for transportation   Equipment Recommendations  None recommended by OT    Recommendations for Other  Services      Precautions / Restrictions Precautions Precautions: Fall;Other (comment) Precaution Comments: monitor O2 sats, ambulatory O2 may be needed Restrictions Weight Bearing Restrictions: No        ADL either performed or assessed with clinical judgement   ADL Overall ADL's : Needs assistance/impaired                 General ADL Comments: Educated pt on the 4ps of energy conservation, handout provided, discussed options suchs as chairs and stools to provided seated rest breaks and limiting distance until allowing time to recover from SOB. Pt verbalized understanding. Pt deferred OOB mobility despite encouragement from OT (see progress note summary).      Cognition Arousal/Alertness: Lethargic Behavior During Therapy: WFL for tasks assessed/performed Overall Cognitive Status: History of cognitive impairments - at baseline                   Frequency  Min 1X/week        Progress Toward Goals  OT Goals(current goals can now be found in the care plan section)  Progress towards OT goals: OT to reassess next treatment  Acute Rehab OT Goals Patient Stated Goal: to improve balance and functional strength OT Goal Formulation: With patient Time For Goal Achievement: 07/28/23 Potential to Achieve Goals: Fair  Plan Discharge plan remains appropriate       AM-PAC OT "6 Clicks" Daily Activity     Outcome Measure   Help from another person eating meals?: A Little Help from another person taking care of personal grooming?: A Little Help from another person toileting, which includes using toliet, bedpan, or urinal?: A Lot Help from another person bathing (  including washing, rinsing, drying)?: A Lot Help from another person to put on and taking off regular upper body clothing?: A Lot Help from another person to put on and taking off regular lower body clothing?: A Lot 6 Click Score: 14    End of Session Equipment Utilized During Treatment: Gait belt  OT  Visit Diagnosis: Unsteadiness on feet (R26.81);Other abnormalities of gait and mobility (R26.89);History of falling (Z91.81);Muscle weakness (generalized) (M62.81)   Activity Tolerance Patient tolerated treatment well   Patient Left in bed;with call bell/phone within reach   Nurse Communication Mobility status        Time: 1730-1745 OT Time Calculation (min): 15 min  Charges: OT General Charges $OT Visit: 1 Visit OT Treatments $Therapeutic Activity: 8-22 mins  07/19/2023  AB, OTR/L  Acute Rehabilitation Services  Office: [phone]   Tristan Schroeder 07/19/2023, 6:20 PM

## 2023-07-19 NOTE — Plan of Care (Signed)
  Problem: Clinical Measurements: Goal: Will remain free from infection Outcome: Progressing Goal: Diagnostic test results will improve Outcome: Progressing   

## 2023-07-20 ENCOUNTER — Inpatient Hospital Stay (HOSPITAL_COMMUNITY): Payer: Medicare Other

## 2023-07-20 DIAGNOSIS — R310 Gross hematuria: Secondary | ICD-10-CM | POA: Diagnosis not present

## 2023-07-20 LAB — BLOOD GAS, VENOUS
Acid-Base Excess: 2.7 mmol/L — ABNORMAL HIGH (ref 0.0–2.0)
Bicarbonate: 27.9 mmol/L (ref 20.0–28.0)
Drawn by: 7344
O2 Saturation: 94.7 %
Patient temperature: 37
pCO2, Ven: 44 mmHg (ref 44–60)
pH, Ven: 7.41 (ref 7.25–7.43)
pO2, Ven: 63 mmHg — ABNORMAL HIGH (ref 32–45)

## 2023-07-20 MED ORDER — OXYCODONE HCL 5 MG PO TABS: 10.0000 mg | ORAL_TABLET | ORAL | Status: DC

## 2023-07-20 MED ORDER — OXYCODONE HCL 5 MG PO TABS: 5.0000 mg | ORAL_TABLET | Freq: Every evening | ORAL | Status: DC | PRN

## 2023-07-20 MED ORDER — OXYCODONE HCL 5 MG PO TABS: 15.0000 mg | ORAL_TABLET | ORAL | Status: DC

## 2023-07-20 MED ORDER — OXYCODONE HCL 5 MG PO TABS
10.0000 mg | ORAL_TABLET | ORAL | Status: DC
  Administered 2023-07-20 – 2023-07-21 (×2): 10 mg via ORAL
  Filled 2023-07-20 (×2): qty 2

## 2023-07-20 MED ORDER — OXYCODONE HCL 5 MG PO TABS
5.0000 mg | ORAL_TABLET | Freq: Every evening | ORAL | Status: DC | PRN
  Administered 2023-07-21: 5 mg via ORAL
  Filled 2023-07-20: qty 1

## 2023-07-20 NOTE — Assessment & Plan Note (Signed)
Reports drinking 2-3 drinks poor day but is not a great historian. D/C CIWAs.  - continue supplements

## 2023-07-20 NOTE — Assessment & Plan Note (Addendum)
Likely due to CAP, improving. S/P Ceftriaxone (7/26-7/28 ), Azithromycin (7/27- 7/29), Cefdinir 4 doses (7/28-7/30) S/p solumedrol 125mg  with EMS 7/26 and completed Prednisone 40mg  (7/27 - 7/30). Patient was able to ambulate without needing oxygen, though was previously recommended to use home oxygen.  - albuterol nebs prn - Continue home Trelegy  - Continue home Albuterol PRN Q4H

## 2023-07-20 NOTE — Assessment & Plan Note (Addendum)
Had urinary retention with >400 mL on 7/28 PM. Urology placed 16 F catheter on 7/28. Urine culture negative. Void trial yesterday, with bladder scans showing 119 >69> 15 with UOP of 1525 mL. Hg remains stable - Urology consulted, appreciate recommendations  - Continue Flomax 0.4 mg daily  - Outpatient fu with [facility] for likely cystoscopy & evaluation  - hg has been stable, will start lovenox for DVT ppx today

## 2023-07-20 NOTE — Progress Notes (Signed)
Daily Progress Note Intern Pager: [phone]  Patient name: Kent Flowers [facility] Medical record number: [id number] Date of birth: 25-Jan-1946 Age: 77 y.o. 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 Urology placed 16 F  catheter on 7/28. Urine culture negative. Void trial yesterday, with  bladder scans showing 119 >69> 15 with UOP of 1525 mL. Hg remains stable - Urology consulted, appreciate recommendations  - Continue Flomax 0.4 mg daily  - Outpatient fu with [facility] for likely cystoscopy & evaluation  - hg has been stable, will start lovenox for DVT ppx today        Recurrent falls     Multifactorial: sepsis 2/2 to UTI, poor balance in the setting of  reduced neck range of motion, dehydration. Goal for CIR.  - Fall and delirium precautions        Alcohol use     Reports drinking 2-3 drinks poor day but is not a great historian. D/C  CIWAs.  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  - continue to monitor  - I/Os          Acute on chronic respiratory failure with hypoxia (HCC)     Likely due to CAP, improving. S/P Ceftriaxone (7/26-7/28 ),  Azithromycin (7/27- 7/29), Cefdinir 4 doses (7/28-7/30) S/p solumedrol  125mg  with EMS 7/26 and completed Prednisone 40mg  (7/27 - 7/30). Patient  was able to ambulate without needing oxygen, though was  previously  recommended to use home oxygen.  - albuterol nebs prn - Continue home Trelegy  - Continue home Albuterol PRN Q4H      FEN/GI: Regular PPx: Lovenox Dispo:CIR pending authorization.   Subjective:  Reports he is not doing well today.  He states his breakfast was not right and he had no one to assist with his feeds.  He states that he needs to use the bathroom but no one will help him get up and they take a long time getting there.  He reports no increased shortness of breath or pain with urination.  No other complaints at this time  Objective: Temp:  [98 F (36.7 C)-98.6 F (37 C)] 98.2 F (36.8 C) (08/02 0925) Pulse Rate:  [60-80] 72 (08/02 0925) Resp:  [14-18] 17 (08/02 0925) BP: (127-160)/(70-86) 160/86 (08/02 0925) SpO2:  [92 %-97 %] 92 % (08/02 0925) Physical Exam: General: Sitting up in bed, no acute distress Cardiovascular: Regular rate and rhythm no murmurs rubs or gallops Respiratory: Coarse throughout, unchanged from prior, normal work of breathing on room air Abdomen: Soft nontender nondistended Extremities: Weakness and decreased range of motion of right hand with resting tremor  Laboratory: Most recent CBC Lab Results  Component Value Date   WBC 15.4 (H) 07/20/2023  HGB 12.3 (L) 07/20/2023   HCT 38.7 (L) 07/20/2023   MCV 94.2 07/20/2023   PLT 367 07/20/2023   Most recent BMP    Latest Ref Rng & Units 07/17/2023   12:23 AM  BMP  Glucose 70 - 99 mg/dL 960   BUN 8 - 23 mg/dL 34   Creatinine 4.54 - 1.24 mg/dL 0.98   Sodium 119 - 147 mmol/L 136   Potassium 3.5 - 5.1 mmol/L 4.4   Chloride 98 - 111 mmol/L 101   CO2 22 - 32 mmol/L 26   Calcium 8.9 - 10.3 mg/dL 8.4     Imaging/Diagnostic Tests: No new imaging Penne Lash, MD 07/20/2023, 11:09 AM  PGY-1, [facility] FPTS Intern pager: [phone], text pages welcome Secure chat group [facility] Teaching Service

## 2023-07-20 NOTE — Progress Notes (Signed)
Inpatient Rehab Admissions Coordinator:   Received a denial from [facility] regarding CIR prior auth request.  I spoke to pt at bedside to update and he would like to appeal this decision.  I will start that process once PT/OT have had a chance to see him today and update their notes.    Estill Dooms, PT, DPT Admissions Coordinator [phone] 07/20/23  12:23 PM

## 2023-07-20 NOTE — Progress Notes (Signed)
FMTS Interim Progress Note  S:Assessed patient at bedside due to nursing report that his son had raise concern for altered/slurred speech after talking to him on the phone.  On my arrival he is sitting in recliner watching TV in NAD.  He tells me that he was transiently confused when talking to his son because he was trying to focus on 2 things: Talking to his son and watching something on TV that interested him.  He denies any sensory changes or new/worsening weakness.  O: BP 118/64 (BP Location: Left Arm)   Pulse 65   Temp 98.2 F (36.8 C) (Oral)   Resp 17   Ht 5\' 7"  (1.702 m)   Wt 83.5 kg   SpO2 93%   BMI 28.83 kg/m   Gen: NAD, sitting in recliner watching TV Neuro:  CN III, IV, VI - EOMs intact CN V - jaw clench intact  CN VII - smiles CN VIII - hearing at baseline (HOH at baseline) CN IX - swallow observed CN X - speech at baseline CN XI - Symmetric shrug CN XII - tongue movements intact  Speech unchanged compared to my prior assessments. Remains with RUE weakness and tremor but this is likewise unchanged compared to my prior exams. He has equal strength to the bilateral lower extremities.   A/P: Transient Confusion and Speech Disturbance  Exam is reassuring. However, given son's concern about his speech and his admission of transient confusion (though with a plausible explanation), I do think it prudent to repeat a CT of the head. His son certainly knows his baseline better than I do. Will also check a VBG to rule out hypercarbia as a cause of his transient confusion. - CT Head - VBG - CBG  Alicia Amel, MD 07/20/2023, 4:54 PM PGY-3, [facility] Family Medicine Service pager [phone]

## 2023-07-20 NOTE — Progress Notes (Signed)
     Subjective: NAEON. Pt up in chair. No LUTS  Objective: Vital signs in last 24 hours: Temp:  [98 F (36.7 C)-98.6 F (37 C)] 98.2 F (36.8 C) (08/02 0925) Pulse Rate:  [60-80] 72 (08/02 0925) Resp:  [14-18] 17 (08/02 0925) BP: (127-160)/(70-86) 160/86 (08/02 0925) SpO2:  [92 %-97 %] 92 % (08/02 0925)  Intake/Output from previous day: 08/01 0701 - 08/02 0700 In: 343 [P.O.:343] Out: 2655 [Urine:2655]  Intake/Output this shift: No intake/output data recorded.  Physical Exam:  General: Alert and oriented CV: No cyanosis Lungs: equal chest rise Gu: foley catheter removed  Lab Results: Recent Labs    07/18/23 1231 07/20/23 0011  HGB 11.8* 12.3*  HCT 35.7* 38.7*   BMET No results for input(s): "NA", "K", "CL", "CO2", "GLUCOSE", "BUN", "CREATININE", "CALCIUM" in the last 72 hours.    Studies/Results: No results found.  Assessment/Plan:  # Hemorrhagic cystitis Patient was being treated with ABX and had been experiencing gross hematuria for 1 week prior to admission to the hospital.   Difficult Foley placement by Dr. Ronne Binning.  33f coud catheter with 400 cc brown bloody urine returned on 7/28. Hematuria ongoing but improved with stable HgB. TOV 07/20/23. UOP- 2655  Avoid blood thinners if at all possible.   Follow up in clinic 1-2 weeks for reassessment and PVR. Follow up with Dr. Mena Goes at [facility] in 4-6 weeks for cystoscopy and reassessment. Request sent to [facility] schedulers.   Urology will sign off at this time. Please call with questions, concerns, or acute Urologic changes.    LOS: 5 days   Elmon Kirschner, NP [facility] Pager: [phone]  07/20/2023, 10:46 AM

## 2023-07-20 NOTE — [place] FL2 (Signed)
[place] MEDICAID FL2 LEVEL OF CARE FORM     IDENTIFICATION  Patient Name: Kent Flowers Birthdate: 11-12-1946 Sex: male Admission Date (Current Location): 07/13/2023  [facility] and [place] Number:  Producer, television/film/video and Address:  [facility] [place], [address], [place], [place] [address]      Provider Number: [id number]  Attending Physician Name and Address:  Carney Living, MD  Relative Name and Phone Number:  Ash[place], Littles   [phone]    Current Level of Care: Hospital Recommended Level of Care: Skilled Nursing Facility Prior Approval Number:    Date Approved/Denied:   PASRR Number:    Discharge Plan: SNF    Current Diagnoses: Patient Active Problem List   Diagnosis Date Noted   Cervical strain 07/15/2023   Acute on chronic respiratory failure with hypoxia (HCC) 07/14/2023   COPD exacerbation (HCC) 07/13/2023   Gross hematuria 07/13/2023   Bilateral leg edema 07/13/2023   Abnormal magnetic resonance cholangiopancreatography (MRCP)    Biliary sludge    Mild late onset Alzheimer's dementia without behavioral disturbance, psychotic disturbance, mood disturbance, or anxiety (HCC) 09/18/2022   Acute encephalopathy 09/17/2022   Impaired cognition 03/23/2022   Recurrent falls 01/20/2022   Tobacco use 01/20/2022   Alcohol use 01/20/2022   Acute renal failure superimposed on stage 3a chronic kidney disease (HCC) 01/20/2022   Anemia of chronic disease 01/20/2022   Dens fracture (HCC) 01/19/2022   Community acquired pneumonia 01/19/2022   Choledocholithiasis 11/03/2020   Diskitis 05/10/2020   Urinary incontinence 03/24/2020   Fecal incontinence 03/24/2020   Epidural abscess    Sepsis (HCC) 02/04/2020   Leg swelling 02/04/2020   Vertebral osteophyte 02/04/2020   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   Pulmonary mass 01/22/2020   PICC (peripherally inserted central catheter) in place 01/22/2020   Body mass index (BMI)  27.0-27.9, adult 01/16/2020   Elevated blood-pressure reading, without diagnosis of hypertension 01/16/2020   Paresthesia of right upper extremity 12/18/2019   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/2019   Status post laminectomy 12/18/2019   Insomnia 09/22/2019   Neck pain 11/04/2017   Other social stressor 07/19/2016   COPD with acute bronchitis (HCC) 10/04/2014   Hepatitis, unspecified 03/13/2014   COPD mixed type (HCC)    Chronic low back pain    Knee pain, bilateral    Right knee DJD    Hepatitis 11/03/2013   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   Aortic calcification (HCC) 11/03/2013   Coronary atherosclerosis of native coronary artery 11/03/2013   Penile neoplasm    Ascending aortic aneurysm (HCC)    Lung nodule 05/23/2012   COPD with acute exacerbation (HCC) 02/24/2012    Orientation RESPIRATION BLADDER Height & Weight     Self, Time, Situation, Place  Normal Continent Weight: 184 lb 1.4 oz (83.5 kg) Height:  5\' 7"  (170.2 cm)  BEHAVIORAL SYMPTOMS/MOOD NEUROLOGICAL BOWEL NUTRITION STATUS      Incontinent Diet (see discharge summary)  AMBULATORY STATUS COMMUNICATION OF NEEDS Skin   Limited Assist Verbally Other (Comment) (ecchymosis, redness)                       Personal Care Assistance Level of Assistance  Bathing, Feeding, Dressing Bathing Assistance: Maximum assistance Feeding assistance: Limited assistance Dressing Assistance: Maximum assistance     Functional Limitations Info  Sight, Hearing, Speech Sight Info: Adequate Hearing Info: Adequate Speech Info: Adequate    SPECIAL CARE FACTORS FREQUENCY  PT (By licensed PT), OT (By licensed OT)     PT Frequency: 5x week OT Frequency: 5x week            Contractures Contractures Info: Not present    Additional Factors Info  Code Status, Allergies Code Status Info: DNR Allergies Info: Ceclor (Cefaclor), Sulfa Antibiotics           Current Medications (07/20/2023):  This is  the current hospital active medication list Current Facility-Administered Medications  Medication Dose Route Frequency Provider Last Rate Last Admin   acetaminophen (TYLENOL) tablet 650 mg  650 mg Oral Q6H Darnelle Spangle B, MD   650 mg at 07/20/23 1208   albuterol (PROVENTIL) (2.5 MG/3ML) 0.083% nebulizer solution 2.5 mg  2.5 mg Nebulization Q4H PRN Levin Erp, MD   2.5 mg at 07/17/23 2337   aspirin EC tablet 81 mg  81 mg Oral Daily Fortunato Curling, DO   81 mg at 07/20/23 4696   cyanocobalamin (VITAMIN B12) tablet 1,000 mcg  1,000 mcg Oral Daily Fortunato Curling, DO   1,000 mcg at 07/20/23 2952   donepezil (ARICEPT) tablet 10 mg  10 mg Oral QHS Gomes, Adriana, DO   10 mg at 07/19/23 2144   fluticasone furoate-vilanterol (BREO ELLIPTA) 200-25 MCG/ACT 1 puff  1 puff Inhalation Daily Fortunato Curling, DO   1 puff at 07/20/23 0749   And   umeclidinium bromide (INCRUSE ELLIPTA) 62.5 MCG/ACT 1 puff  1 puff Inhalation Daily Fortunato Curling, DO   1 puff at 07/20/23 0749   folic acid (FOLVITE) tablet 1 mg  1 mg Oral Daily Fortunato Curling, DO   1 mg at 07/20/23 8413   hydrOXYzine (ATARAX) tablet 25 mg  25 mg Oral TID PRN Alicia Amel, MD   25 mg at 07/17/23 0312   lidocaine (LIDODERM) 5 % 1 patch  1 patch Transdermal Q24H Alicia Amel, MD   1 patch at 07/19/23 1619   multivitamin with minerals tablet 1 tablet  1 tablet Oral Daily Fortunato Curling, DO   1 tablet at 07/20/23 2440   oxyCODONE (Oxy IR/ROXICODONE) immediate release tablet 15 mg  15 mg Oral 4 times per day Alicia Amel, MD   15 mg at 07/20/23 1208   And   oxyCODONE (Oxy IR/ROXICODONE) immediate release tablet 15 mg  15 mg Oral QHS PRN Alicia Amel, MD   15 mg at 07/20/23 0139   polyethylene glycol (MIRALAX / GLYCOLAX) packet 17 g  17 g Oral Daily Darnelle Spangle B, MD   17 g at 07/20/23 0824   QUEtiapine (SEROQUEL) tablet 25 mg  25 mg Oral BID Fortunato Curling, DO   25 mg at 07/20/23 1027   tamsulosin (FLOMAX) capsule 0.4 mg  0.4 mg  Oral QHS Fortunato Curling, DO   0.4 mg at 07/19/23 2144   thiamine (VITAMIN B1) tablet 100 mg  100 mg Oral Daily Fortunato Curling, DO   100 mg at 07/20/23 2536     Discharge Medications: Please see discharge summary for a list of discharge medications.  Relevant Imaging Results:  Relevant Lab Results:   Additional Information SS#: [id number]  Lorri Frederick, LCSW

## 2023-07-20 NOTE — Progress Notes (Signed)
Patients WBC was elevated in morning labs.

## 2023-07-20 NOTE — Progress Notes (Signed)
Physical Therapy Treatment Patient Details Name: Kent Flowers MRN: [id number] DOB: 11/15/1946 Today's Date: 07/20/2023   History of Present Illness 77 y.o. male admitted on 07/13/23 for falls, acute hpoxemia, sepsis, and elevated troponin.  Pt dx with UTI (with hematuria) and possible PNA.  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  Pt with significant PMH of falls, COPD, thoracic laminectomy, L shoulder RTC repair and decompression (2004).    PT Comments  Pt continues to be motivated for improve function.  He is limited due to fear of falling.  Sit to stand lift with foot plate removed to eliminate posterior bias and allow for progression of gt.  Pt presents with R sided weakness in RUE and RLE.  Required manual assistance to advance RLE forward this session.  Pt presents with decreased functional capacity and poor balance.  He continues to benefit from aggressive rehab in a post acute setting to maximize functional gains before returning home.      If plan is discharge home, recommend the following: A little help with walking and/or transfers;Assistance with cooking/housework;Assist for transportation;Help with stairs or ramp for entrance   Can travel by private vehicle        Equipment Recommendations  None recommended by PT    Recommendations for Other Services Rehab consult     Precautions / Restrictions Precautions Precautions: Fall;Other (comment) Precaution Comments: monitor O2 sats, ambulatory O2 may be needed Restrictions Weight Bearing Restrictions: No     Mobility  Bed Mobility               General bed mobility comments: Pt seated in recliner on arrival.    Transfers Overall transfer level: Needs assistance Equipment used: Ambulation equipment used, Bilateral platform walker (sara + sit to stand as he presents with posterior bias with OT due to fear of  falling.) Transfers: Sit to/from Stand Sit to Stand: Max assist (with mechanical lift to aid in boost into standing.  Once boost with machine able to rely less on lift equipment to maintain standing.)           General transfer comment: Performed x 3 attempts with lift and max assistance +1 with mechanical lift to avoid posterior pushing.    Ambulation/Gait Ambulation/Gait assistance: Mod assist, +2 safety/equipment (in lift equipment.) Gait Distance (Feet): 10 Feet Assistive device: Bilateral platform walker (sara + on wheels with foot plateroom) Gait Pattern/deviations: Step-to pattern, Shuffle, Decreased dorsiflexion - right, Decreased stance time - left, Decreased dorsiflexion - left, Decreased weight shift to left Gait velocity: decreased cadence     General Gait Details: Pt required assistance to lift R foot and progress it forward.  Manual facilitation to move R foot forward as he began to fatigue.   Stairs             Wheelchair Mobility     Tilt Bed    Modified Rankin (Stroke Patients Only)       Balance     Sitting balance-Leahy Scale: Poor       Standing balance-Leahy Scale: Poor Standing balance comment: Reliant on therapist for support                            Cognition Arousal/Alertness: Awake/alert Behavior During Therapy: WFL for tasks assessed/performed Overall Cognitive Status: History of cognitive impairments - at baseline  General Comments: slurred speech and difficulty with word finding.        Exercises      General Comments        Pertinent Vitals/Pain Pain Assessment Pain Assessment: Faces Faces Pain Scale: Hurts little more Pain Location: back of neck Pain Descriptors / Indicators: Aching, Discomfort Pain Intervention(s): Monitored during session, Repositioned    Home Living                          Prior Function            PT Goals (current  goals can now be found in the care plan section) Acute Rehab PT Goals Patient Stated Goal: to go home Potential to Achieve Goals: Good Progress towards PT goals: Progressing toward goals    Frequency    Min 1X/week      PT Plan Current plan remains appropriate    Co-evaluation              AM-PAC PT "6 Clicks" Mobility   Outcome Measure  Help needed turning from your back to your side while in a flat bed without using bedrails?: A Little Help needed moving from lying on your back to sitting on the side of a flat bed without using bedrails?: A Little Help needed moving to and from a bed to a chair (including a wheelchair)?: A Lot Help needed standing up from a chair using your arms (e.g., wheelchair or bedside chair)?: A Lot Help needed to walk in hospital room?: A Lot Help needed climbing 3-5 steps with a railing? : Total 6 Click Score: 13    End of Session Equipment Utilized During Treatment: Gait belt Activity Tolerance: Patient limited by fatigue;Patient limited by pain;Other (comment) (high fear of falling.)   Nurse Communication: Mobility status PT Visit Diagnosis: Muscle weakness (generalized) (M62.81);Difficulty in walking, not elsewhere classified (R26.2);Pain Pain - Right/Left:  (neck) Pain - part of body:  (neck)     Time: 6962-9528 PT Time Calculation (min) (ACUTE ONLY): 30 min  Charges:    $Gait Training: 8-22 mins $Therapeutic Activity: 8-22 mins PT General Charges $$ ACUTE PT VISIT: 1 Visit                     Bonney Leitz , PTA Acute Rehabilitation Services Office [phone]     Artis Delay 07/20/2023, 2:01 PM

## 2023-07-20 NOTE — Assessment & Plan Note (Signed)
Multifactorial: sepsis 2/2 to UTI, poor balance in the setting of reduced neck range of motion, dehydration. Goal for CIR.  - Fall and delirium precautions

## 2023-07-20 NOTE — Progress Notes (Signed)
Occupational Therapy Treatment Patient Details Name: Kent Flowers MRN: [id number] DOB: 17-Mar-1946 Today's Date: 07/20/2023   History of present illness 77 y.o. male admitted on 07/13/23 for falls, acute hpoxemia, sepsis, and elevated troponin.  Pt dx with UTI (with hematuria) and possible PNA.  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  Pt with significant PMH of falls, COPD, thoracic laminectomy, L shoulder RTC repair and decompression (2004).   OT comments  Pt continues to present with DOE but it recovers following seated rest breaks. Pt expressed that he is very motivated to try his best efforts with therapy today. He needs Max A for STS attempts, tried 3 attempts at standing but pt never able to gather balance to produce steps. He has a posterior lean when attempting to stand making the transition much harder, cues provided for forward weight shifting to promote better success. Pt currently far from baseline but has the desire and potential to get back to mod I level in due time. OT to continue to progress pt as able, DC plans remain appropriate for AIR.    Recommendations for follow up therapy are one component of a multi-disciplinary discharge planning process, led by the attending physician.  Recommendations may be updated based on patient status, additional functional criteria and insurance authorization.    Assistance Recommended at Discharge Frequent or constant Supervision/Assistance  Patient can return home with the following  A lot of help with walking and/or transfers;A lot of help with bathing/dressing/bathroom;Assistance with cooking/housework;Help with stairs or ramp for entrance;Assist for transportation   Equipment Recommendations  None recommended by OT    Recommendations for Other Services      Precautions / Restrictions Precautions Precautions: Fall;Other (comment) Precaution  Comments: monitor O2 sats, ambulatory O2 may be needed Restrictions Weight Bearing Restrictions: No       Mobility Bed Mobility               General bed mobility comments: Pt received in chair and left in chair    Transfers Overall transfer level: Needs assistance Equipment used: 1 person hand held assist, Rolling walker (2 wheels) Transfers: Sit to/from Stand Sit to Stand: Max assist           General transfer comment: STS x3 attempts, all max A. Able to achieve full stand put not gather balance. cues for forward weight shifts     Balance Overall balance assessment: Needs assistance Sitting-balance support: Feet supported, Bilateral upper extremity supported Sitting balance-Leahy Scale: Fair     Standing balance support: During functional activity, Reliant on assistive device for balance, Bilateral upper extremity supported Standing balance-Leahy Scale: Poor Standing balance comment: Reliant on therapist for support                           ADL either performed or assessed with clinical judgement   ADL Overall ADL's : Needs assistance/impaired                     Lower Body Dressing: Sitting/lateral leans;Maximal assistance Lower Body Dressing Details (indicate cue type and reason): socks               General ADL Comments: Pt very motivated to give functional mobility his best efforts. He was unable to gather standing balance despite 3 attempts, remains generally weak and pt is aware of this. DOE needing 2  min rest breaks to recover in between standing attempts    Extremity/Trunk Assessment              Vision       Perception     Praxis      Cognition Arousal/Alertness: Awake/alert Behavior During Therapy: WFL for tasks assessed/performed Overall Cognitive Status: History of cognitive impairments - at baseline                                 General Comments: Slower processing        Exercises       Shoulder Instructions       General Comments      Pertinent Vitals/ Pain       Pain Assessment Pain Assessment: No/denies pain  Home Living                                          Prior Functioning/Environment              Frequency  Min 1X/week        Progress Toward Goals  OT Goals(current goals can now be found in the care plan section)  Progress towards OT goals: Progressing toward goals  Acute Rehab OT Goals OT Goal Formulation: With patient Time For Goal Achievement: 07/28/23 Potential to Achieve Goals: Fair  Plan Discharge plan remains appropriate;Frequency remains appropriate    Co-evaluation                 AM-PAC OT "6 Clicks" Daily Activity     Outcome Measure   Help from another person eating meals?: A Little Help from another person taking care of personal grooming?: A Little Help from another person toileting, which includes using toliet, bedpan, or urinal?: A Lot Help from another person bathing (including washing, rinsing, drying)?: A Lot Help from another person to put on and taking off regular upper body clothing?: A Lot Help from another person to put on and taking off regular lower body clothing?: A Lot 6 Click Score: 14    End of Session Equipment Utilized During Treatment: Gait belt;Rolling walker (2 wheels)  OT Visit Diagnosis: Unsteadiness on feet (R26.81);Other abnormalities of gait and mobility (R26.89);History of falling (Z91.81);Muscle weakness (generalized) (M62.81)   Activity Tolerance Patient tolerated treatment well   Patient Left with call bell/phone within reach;in chair   Nurse Communication Mobility status        Time: 1238-1300 OT Time Calculation (min): 22 min  Charges: OT General Charges $OT Visit: 1 Visit OT Treatments $Therapeutic Activity: 8-22 mins  07/20/2023  AB, OTR/L  Acute Rehabilitation Services  Office: [phone]   Tristan Schroeder 07/20/2023, 1:10 PM

## 2023-07-20 NOTE — Progress Notes (Addendum)
FMTS Brief Progress Note  S: Messaged by primary night RN about whether to do his scheduled oxy and seroquel and is requesting something for pain - noted some forgetfulness and maybe weaker on the right side and speech is more stuttering/delay which concerned son this morning.  Went to assess patient at bedside to assess neurologic status.  Patient is alert and responsive to all my questions.  He states that he does not want to answer any orientation questions.  Apparently has had this done twice prior to me seeing him 1 by Dr. Marisue Humble and 1 by primary RN and he states that he does not need to do this.  I stated that I am trying to assess his neurologic status-he states that he does not want this to be done.  He says that he is upset that we keep doing this.  We discussed that multiple people have pointed out that he seems a little off today and I just wanted to check his neurologic status but he refuses.  O: BP (!) 155/70 (BP Location: Left Arm)   Pulse 71   Temp 97.9 F (36.6 C) (Oral)   Resp 20   Ht 5\' 7"  (1.702 m)   Wt 83.5 kg   SpO2 100%   BMI 28.83 kg/m    Gen: NAD, awake, alert and responsive to questions but slightly agitated Resp: Breathing comfortably on room air, speaking full sentences Abd: Distended, nontender to palpation CV: RRR Neuro: CN II: pupils constricted but sluggishly reactive Grip strength decreased on right compared to left Raises both legs fine Declines me assessing any other neurologic exam  A/P: Altered mentation He is overall responsive to my questions and very alert.  He is just agitated and does not want to be bothered with more neurologic examinations.  He does have some chronic right-sided weakness that seems to be stable.  Highest on differential is a component of acute urinary retention given recent Foley removal. 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  Output has been reportedly documented today.  Only has 310 the recorded from dayshift.  Per nursing he voided several times throughout the day at least 5 also apparently several episodes of incontinence.  I have asked to please measure a postvoid residual and for now we will monitor bladder scans a little closer. Trauma-fall on admission, CT scan without any SDH Temperature-within normal limits Thiamine (Wernicke's)-less likely Insulin-he is not on any Psychiatric-possibly related to in-hospital delirium-okay to give Seroquel tonight r Stroke-possible give right-sided weakness (decreased grip strength on the right side) however seems to be stable from previous examinations.  seems to be mentating well on my examination however he is just agitated and does not want to be bothered with neurologic examinations which I tried to explain to him reasoning is why we are doing this. CT Head negative. If worsening could consider an MRI brain. Seizure-no witnessed, unlikely - Orders reviewed. Labs for AM ordered, which was adjusted as needed.  -  If condition changes, plan includes touching base with urology if continuing to retain, considering catheterization  Levin Erp, MD 07/20/2023, 9:49 PM PGY-3, [facility] Night Resident  Please page [phone] with questions.

## 2023-07-20 NOTE — TOC Progression Note (Signed)
Transition of Care ) - Progression Note    Patient Details  Name: Kent Flowers MRN: [id number] Date of Birth: [birth year]  Transition [facility] Care [facility]) CM/SW Contact  Lorri Frederick, LCSW Phone Number: 07/20/2023, 4:09 PM  Clinical Narrative:  CSW informed CIR auth denied, appeal in process.  CSW spoke with pt regarding SNF back up plan.  Pt is agreeable to this, permission given to send out referral in hub, which was done.  PASSR has not been completed.     Expected Discharge Plan: IP Rehab Facility Barriers to Discharge: Insurance Authorization  Expected Discharge Plan and Services       Living arrangements for the past 2 months: Independent Living Facility, Single Family Home                                       Social Determinants of Health (SDOH) Interventions SDOH Screenings   Food Insecurity: Food Insecurity Present (03/02/2020)  Housing: Low Risk  (03/02/2020)  Recent Concern: Housing - Medium Risk (02/20/2020)  Transportation Needs: No Transportation Needs (03/02/2020)  Alcohol Screen: Low Risk  (03/02/2020)  Depression (PHQ2-9): Low Risk  (03/16/2021)  Financial Resource Strain: Low Risk  (03/02/2020)  Physical Activity: Inactive (03/02/2020)  Social Connections: Unknown (05/02/2022)   Received from [facility]  Stress: Stress Concern Present (03/02/2020)  Tobacco Use: Medium Risk (07/13/2023)    Readmission Risk Interventions     No data to display

## 2023-07-20 NOTE — Progress Notes (Signed)
Inpatient Rehab Admissions Coordinator:   Peer to peer offered by pt's insurance.  Dr. Riley Kill to complete today.   Estill Dooms, PT, DPT Admissions Coordinator [phone] 07/20/23  10:55 AM

## 2023-07-21 DIAGNOSIS — R4182 Altered mental status, unspecified: Secondary | ICD-10-CM | POA: Insufficient documentation

## 2023-07-21 DIAGNOSIS — R310 Gross hematuria: Secondary | ICD-10-CM | POA: Diagnosis not present

## 2023-07-21 MED ORDER — ENOXAPARIN SODIUM 40 MG/0.4ML IJ SOSY
40.0000 mg | PREFILLED_SYRINGE | INTRAMUSCULAR | Status: DC
  Administered 2023-07-21 – 2023-08-06 (×17): 40 mg via SUBCUTANEOUS
  Filled 2023-07-21 (×17): qty 0.4

## 2023-07-21 MED ORDER — OXYCODONE HCL 5 MG PO TABS
15.0000 mg | ORAL_TABLET | ORAL | Status: DC
  Administered 2023-07-21 – 2023-08-07 (×68): 15 mg via ORAL
  Filled 2023-07-21 (×70): qty 3

## 2023-07-21 MED ORDER — OXYCODONE HCL 5 MG PO TABS
10.0000 mg | ORAL_TABLET | Freq: Every evening | ORAL | Status: DC | PRN
  Administered 2023-07-22 – 2023-08-05 (×4): 10 mg via ORAL
  Filled 2023-07-21 (×4): qty 2

## 2023-07-21 MED ORDER — TAMSULOSIN HCL 0.4 MG PO CAPS
0.8000 mg | ORAL_CAPSULE | Freq: Every day | ORAL | Status: DC
  Administered 2023-07-21 – 2023-08-06 (×17): 0.8 mg via ORAL
  Filled 2023-07-21 (×17): qty 2

## 2023-07-21 NOTE — Assessment & Plan Note (Signed)
Likely due to CAP, improving. S/P Ceftriaxone (7/26-7/28 ), Azithromycin (7/27- 7/29), Cefdinir 4 doses (7/28-7/30) S/p solumedrol 125mg  with EMS 7/26 and completed Prednisone 40mg  (7/27 - 7/30). Patient is not requiring oxygen and ambulating without complaints of worsening SOB.  - albuterol nebs prn - Continue home Trelegy  - Continue home Albuterol PRN Q4H

## 2023-07-21 NOTE — Plan of Care (Signed)
  Problem: Education: Goal: Knowledge of General Education information will improve Description: Including pain rating scale, medication(s)/side effects and non-pharmacologic comfort measures Outcome: Progressing   Problem: Clinical Measurements: Goal: Ability to maintain clinical measurements within normal limits will improve Outcome: Progressing Goal: Cardiovascular complication will be avoided Outcome: Progressing   Problem: Nutrition: Goal: Adequate nutrition will be maintained Outcome: Progressing   Problem: Coping: Goal: Level of anxiety will decrease Outcome: Progressing   Problem: Elimination: Goal: Will not experience complications related to bowel motility Outcome: Progressing   Problem: Pain Managment: Goal: General experience of comfort will improve Outcome: Progressing   Problem: Safety: Goal: Ability to remain free from injury will improve Outcome: Progressing   Problem: Skin Integrity: Goal: Risk for impaired skin integrity will decrease Outcome: Progressing

## 2023-07-21 NOTE — Plan of Care (Signed)
  Problem: Education: Goal: Knowledge of General Education information will improve Description: Including pain rating scale, medication(s)/side effects and non-pharmacologic comfort measures Outcome: Progressing   Problem: Clinical Measurements: Goal: Ability to maintain clinical measurements within normal limits will improve Outcome: Progressing Goal: Cardiovascular complication will be avoided Outcome: Progressing   Problem: Coping: Goal: Level of anxiety will decrease Outcome: Progressing   Problem: Pain Managment: Goal: General experience of comfort will improve Outcome: Progressing   Problem: Safety: Goal: Ability to remain free from injury will improve Outcome: Progressing   Problem: Skin Integrity: Goal: Risk for impaired skin integrity will decrease Outcome: Progressing

## 2023-07-21 NOTE — Assessment & Plan Note (Signed)
Patient send recent concern that patient might have some increased slurred speech and altered mental status after talking to him over the phone.  Obtained a CT head which showed no acute changes. VBG negative for hypercarbia. BMP showed no obvious electrolyte derangements. Patient is on chronic opioids for neck pain.  Overnight decreased nightly dose of oxycodone from 10 mg to 5 mg. - Postvoid residual to rule out retention -If patient mental status worsens, consider MRI brain

## 2023-07-21 NOTE — Assessment & Plan Note (Addendum)
Had urinary retention with >400 mL on 7/28 PM. Urology placed 16 F catheter on 7/28. Urine culture negative. Void trial 8/1 with appropriate urine output. - post void residual to ensure no retention - Urology consulted, appreciate recommendations  - Continue Flomax 0.4 mg daily  - Outpatient fu with [facility] for likely cystoscopy & evaluation  - DVT ppx as patients Hg have been stable

## 2023-07-21 NOTE — Assessment & Plan Note (Signed)
Had urinary retention with >400 mL on 7/28 PM. Urology placed 16 F catheter on 7/28. Urine culture negative. Void trial 8/1 with appropriate urine output. - post void residual to ensure no retention - Urology consulted, appreciate recommendations  - Increase Flomax 0.4 mg daily  to 0.8 mg daily - Outpatient fu with [facility] for likely cystoscopy & evaluation  - DVT ppx as patients Hg have been stable

## 2023-07-21 NOTE — Assessment & Plan Note (Signed)
Reports drinking 2-3 drinks poor day but is not a great historian. D/C CIWAs.  - continue supplements

## 2023-07-21 NOTE — Assessment & Plan Note (Signed)
S/p fall without head trauma or LOC. 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 Decrease his oxycodone dose from 15 mg every 6 to 10 mg every 6 due to concern by family for increased mental status change.  Patient complaining of increased neck pain this morning.  -Will change his oxycodone back to his home dose of 15 mg every 6 scheduled as patient was complaining of neck pain and we noted this to be a chronic problem  - Lidocaine patch

## 2023-07-21 NOTE — Progress Notes (Signed)
Daily Progress Note Intern Pager: [phone]  Patient name: Kent Flowers [facility] Medical record number: [id number] Date of birth: 1946/02/18 Age: 77 y.o. Gender: male  Primary Care Provider: Earnest Rosier, MD Consultants: Urology Code Status: DNR  Pt Overview and Major Events to Date:  7/29- admitted 7/30- accepted to CIR, pending insurance auth 8/1- void trial   Assessment and Plan:  [facility]     * (Principal) Gross hematuria     Had urinary retention with >400 mL on 7/28 PM. Urology placed 16 F  catheter on 7/28. Urine culture negative. Void trial 8/1 with appropriate  urine output. - post void residual to ensure no retention - Urology consulted, appreciate recommendations  - Continue Flomax 0.4 mg daily  - Outpatient fu with [facility] for likely cystoscopy & evaluation  - DVT ppx as patients Hg have been stable         Neck pain     S/p fall without head trauma or LOC. 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 Decrease his  oxycodone dose from 15 mg every 6 to 10 mg every 6 due to concern by  family for increased mental status change.  Patient complaining of  increased neck pain this morning.  -Will change his oxycodone back to his home dose of 15 mg every 6  scheduled as patient was complaining of neck pain and we noted this to be  a chronic problem  - Lidocaine patch         Recurrent falls     Multifactorial: sepsis 2/2 to UTI, poor balance in the setting of  reduced neck range of motion, dehydration. Goal for CIR.  - Fall and delirium precautions        Alcohol use     Reports drinking 2-3 drinks poor day but is not a great historian. D/C  CIWAs.  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  - continue to monitor  - I/Os        Acute on chronic respiratory failure with hypoxia (HCC)     Likely due to CAP,  improving. S/P Ceftriaxone (7/26-7/28 ),  Azithromycin (7/27- 7/29), Cefdinir 4 doses (7/28-7/30) S/p solumedrol  125mg  with EMS 7/26 and completed Prednisone 40mg  (7/27 - 7/30). Patient  is not requiring oxygen and ambulating without complaints of worsening  SOB.  - albuterol nebs prn - Continue home Trelegy  - Continue home Albuterol PRN Q4H        AMS (altered mental status)     Patient send recent concern that patient might have some increased  slurred speech and altered mental status after talking to him over the  phone.  Obtained a CT head which showed no acute changes. VBG negative for  hypercarbia. BMP showed no obvious electrolyte derangements. Patient is on  chronic opioids for neck pain.  Overnight decreased nightly dose of  oxycodone from 10 mg to 5 mg. - Postvoid residual to rule out retention -If patient mental status worsens, consider MRI brain      FEN/GI: Regular PPx: Lovenox Dispo: CIR pending insurance approval  Subjective:  Patient in good spirits this morning.  He reports that he is feeling well and states that he has no complaints other than him having increased neck pain.  He is asking for his oxycodone this morning as he did not receive his 8:00 dose.  He states that he does not remember peeing today but "is sure that he did."  Objective: Temp:  [97.9 F (36.6 C)-98.6 F (37 C)] 98 F (36.7 C) (08/03 0720) Pulse Rate:  [57-73] 73 (08/03 0811) Resp:  [16-20] 17 (08/03 0811) BP: (118-155)/(64-77) 127/77 (08/03 0720) SpO2:  [93 %-100 %] 93 % (08/03 0811) Physical Exam: General: Appears stated age, sitting up in bed, no acute distress Cardiovascular: Regular rate and rhythm no murmurs rubs or gallops Respiratory: Coarse lung sounds throughout, unchanged from prior, normal work of breathing on room air Abdomen: Slightly more distended than yesterday, soft, nontender Extremities: Weakness to right upper extremity and right hand, chronic for  patient  Laboratory: Most recent CBC Lab Results  Component Value Date   WBC 15.4 (H) 07/20/2023   HGB 12.3 (L) 07/20/2023   HCT 38.7 (L) 07/20/2023   MCV 94.2 07/20/2023   PLT 367 07/20/2023   Most recent BMP    Latest Ref Rng & Units 07/21/2023   12:32 AM  BMP  Glucose 70 - 99 mg/dL 161   BUN 8 - 23 mg/dL 31   Creatinine 0.96 - 1.24 mg/dL 0.45   Sodium 409 - 811 mmol/L 135   Potassium 3.5 - 5.1 mmol/L 3.6   Chloride 98 - 111 mmol/L 101   CO2 22 - 32 mmol/L 27   Calcium 8.9 - 10.3 mg/dL 8.0     VBG:  pH, Ven 9.14 - 7.43 7.41        pCO2, Ven 44 - 60 mmHg 44        pO2, Ven 32 - 45 mmHg 63 High         Bicarbonate 20.0 - 28.0 mmol/L 27.9        Acid-Base Excess 0.0 - 2.0 mmol/L 2.7 High         O2 Saturation % 94.7            Imaging/Diagnostic Tests: CT Head: 8/2  Brain: There is atrophy and chronic small vessel disease changes. No acute intracranial abnormality. Specifically, no hemorrhage, hydrocephalus, mass lesion, acute infarction, or significant intracranial injury.   Vascular: No hyperdense vessel or unexpected calcification.   Skull: No acute calvarial abnormality.   Sinuses/Orbits: No acute findings   Other: None   IMPRESSION: Atrophy, chronic microvascular disease.   No acute intracranial abnormality.  Georg Ruddle , MD 07/21/2023, 1:04 PM  PGY-1, [facility] FPTS Intern pager: [phone], text pages welcome Secure chat group [facility] Teaching Service

## 2023-07-21 NOTE — Assessment & Plan Note (Signed)
Multifactorial: sepsis 2/2 to UTI, poor balance in the setting of reduced neck range of motion, dehydration. Goal for CIR.  - Fall and delirium precautions

## 2023-07-21 NOTE — Assessment & Plan Note (Addendum)
Cr at baseline.  - continue to monitor  - I/Os

## 2023-07-22 DIAGNOSIS — R310 Gross hematuria: Secondary | ICD-10-CM | POA: Diagnosis not present

## 2023-07-22 NOTE — Progress Notes (Signed)
Daily Progress Note Intern Pager: [phone]  Patient name: Kent Flowers [facility] Medical record number: [id number] Date of birth: 1946/02/28 Age: 77 y.o. Gender: male  Primary Care Provider: Earnest Rosier, MD Consultants:  Code Status: DNR   Pt Overview and Major Events to Date:  7/29- admitted 7/30- accepted to CIR, pending insurance auth 8/1- void trial   Assessment and Plan:  Kent Flowers is a 77 y.o. male with a Pertinent PMH/PSH includes hematuria and COPD exacerbation s/p fall on 7/25. Patien't chronic neck pain is much improved this morning after restarting home regimen Urinary symptoms improving after increasing daily flomax dosage. Can discuss additional modalities with urology if symptoms worsen. Insurance denied CIR placement and he is awaiting an appeal. .  -      Hospital     * (Principal) Gross hematuria     Had urinary retention with >400 mL on 7/28 PM. Urology placed 16 F  catheter on 7/28. Urine culture negative. Void trial 8/1 with appropriate  urine output. Bladder scan noted 115 cc of urinary retention last night.  Patient denies any difficulties with urination.  - post void residual to ensure no retention - Urology consulted, appreciate recommendations,  - Increased Flomax 0.4 mg daily  to 0.8 mg daily, discuss med adjustment  w/ urology if urinary symptoms worsen  - Outpatient fu with [facility] for likely cystoscopy & evaluation  - DVT ppx as patients Hg have been stable         Neck pain     S/p fall without head trauma or LOC. 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 Neck pain  much better after changing oxycodone back to his home pain regimen  oxycodone 15 mg QID and oxycodone 10 mg at bedtime. Pain is a chronic  issue.  Home regimen: Oxycodone 15 mg q6h and 10 mg at bedtime  - Lidocaine patch         Recurrent falls     Multifactorial: sepsis 2/2 to UTI, poor balance in the setting of   reduced neck range of motion, dehydration. Goal for CIR.  - Fall and delirium precautions        Alcohol use     Reports drinking 2-3 drinks poor day but is not a great historian. D/C  CIWAs.  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  - continue to monitor  - I/Os        Acute on chronic respiratory failure with hypoxia (HCC)     Likely due to CAP, improving. S/P Ceftriaxone (7/26-7/28 ),  Azithromycin (7/27- 7/29), Cefdinir 4 doses (7/28-7/30) S/p solumedrol  125mg  with EMS 7/26 and completed Prednisone 40mg  (7/27 - 7/30). Patient  is not requiring oxygen and ambulating without complaints of worsening  SOB.  - albuterol nebs prn - Continue home Trelegy  - Continue home Albuterol PRN Q4H        AMS (altered mental status)     Patient send recent concern that patient might have some increased  slurred speech and altered mental status after talking to him over the  phone.  Obtained a CT head which showed no acute changes. VBG negative for  hypercarbia. BMP showed no obvious electrolyte derangements. Patient is on  chronic opioids for neck pain.  - Postvoid residual  to rule out retention -If patient mental status worsens, consider MRI brain       FEN/GI: Regular PPx: Lovenox Dispo:CIR barriers pending insurance approval  Subjective:  No complaints this morning from the patient. Neck pain is much better after restarting home regimen.   Objective: Temp:  [98 F (36.7 C)-98.6 F (37 C)] 98.4 F (36.9 C) (08/04 0851) Pulse Rate:  [61-99] 61 (08/04 0851) Resp:  [16-17] 16 (08/04 0405) BP: (109-160)/(54-95) 109/54 (08/04 0851) SpO2:  [93 %-94 %] 94 % (08/04 0851) Physical Exam: General: Appears stated age, sitting up in bed, no acute distress Cardiovascular: Regular rate and rhythm no murmurs rubs or gallops Respiratory: Coarse lung sounds throughout, unchanged from prior, normal  work of breathing on room air Abdomen: Slightly more distendend, soft, nontender Extremities: Weakness to right upper extremity and right hand, chronic for patient  Laboratory: Most recent CBC Lab Results  Component Value Date   WBC 15.4 (H) 07/20/2023   HGB 12.3 (L) 07/20/2023   HCT 38.7 (L) 07/20/2023   MCV 94.2 07/20/2023   PLT 367 07/20/2023   Most recent BMP    Latest Ref Rng & Units 07/21/2023   12:32 AM  BMP  Glucose 70 - 99 mg/dL 829   BUN 8 - 23 mg/dL 31   Creatinine 5.62 - 1.24 mg/dL 1.30   Sodium 865 - 784 mmol/L 135   Potassium 3.5 - 5.1 mmol/L 3.6   Chloride 98 - 111 mmol/L 101   CO2 22 - 32 mmol/L 27   Calcium 8.9 - 10.3 mg/dL 8.0     [name], MD 07/22/2023, 1:34 PM  PGY-1, [facility] Psychiatry Resident  FPTS Intern pager: [phone], text pages welcome Secure chat group [facility] Teaching Service

## 2023-07-22 NOTE — Assessment & Plan Note (Signed)
Likely due to CAP, improving. S/P Ceftriaxone (7/26-7/28 ), Azithromycin (7/27- 7/29), Cefdinir 4 doses (7/28-7/30) S/p solumedrol 125mg  with EMS 7/26 and completed Prednisone 40mg  (7/27 - 7/30). Patient is not requiring oxygen and ambulating without complaints of worsening SOB.  - albuterol nebs prn - Continue home Trelegy  - Continue home Albuterol PRN Q4H

## 2023-07-22 NOTE — Assessment & Plan Note (Signed)
Patient send recent concern that patient might have some increased slurred speech and altered mental status after talking to him over the phone.  Obtained a CT head which showed no acute changes. VBG negative for hypercarbia. BMP showed no obvious electrolyte derangements. Patient is on chronic opioids for neck pain.  - Postvoid residual to rule out retention -If patient mental status worsens, consider MRI brain

## 2023-07-22 NOTE — Assessment & Plan Note (Signed)
S/p fall without head trauma or LOC. 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 Neck pain much better after changing oxycodone back to his home pain regimen oxycodone 15 mg QID and oxycodone 10 mg at bedtime. Pain is a chronic issue.  Home regimen: Oxycodone 15 mg q6h and 10 mg at bedtime  - Lidocaine patch

## 2023-07-22 NOTE — Assessment & Plan Note (Signed)
Reports drinking 2-3 drinks poor day but is not a great historian. D/C CIWAs.  - continue supplements

## 2023-07-22 NOTE — Assessment & Plan Note (Signed)
Cr at baseline.  - continue to monitor  - I/Os

## 2023-07-22 NOTE — Assessment & Plan Note (Signed)
Multifactorial: sepsis 2/2 to UTI, poor balance in the setting of reduced neck range of motion, dehydration. Goal for CIR.  - Fall and delirium precautions

## 2023-07-23 DIAGNOSIS — R310 Gross hematuria: Secondary | ICD-10-CM | POA: Diagnosis not present

## 2023-07-23 MED ORDER — ALBUTEROL SULFATE HFA 108 (90 BASE) MCG/ACT IN AERS
2.0000 | INHALATION_SPRAY | RESPIRATORY_TRACT | Status: DC | PRN
  Administered 2023-07-29: 2 via RESPIRATORY_TRACT
  Filled 2023-07-23: qty 6.7

## 2023-07-23 MED ORDER — ALBUTEROL SULFATE HFA 108 (90 BASE) MCG/ACT IN AERS: 2.0000 | INHALATION_SPRAY | Freq: Four times a day (QID) | RESPIRATORY_TRACT | Status: DC | PRN

## 2023-07-23 NOTE — Progress Notes (Signed)
Occupational Therapy Treatment Patient Details Name: Kent Flowers MRN: [id number] DOB: 02-Feb-1946 Today's Date: 07/23/2023   History of present illness 77 y.o. male admitted on 07/13/23 for falls, acute hpoxemia, sepsis, and elevated troponin.  Pt dx with UTI (with hematuria) and possible PNA.  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  Pt with significant PMH of falls, COPD, thoracic laminectomy, L shoulder RTC repair and decompression (2004).   OT comments  Pt continues to be very limited in mobility, his Sp02 desats on RA with ambulating 5 steps. Pt is able to complete STS with min A but is Max A +2 to produce steps as patients feet slide forward and he pushes his hips posteriorly. He continues to need Mod to Max A for ADLs. OT to make efforts to progress patient as able, DC plans remain appropriate for CIR at this time.    Recommendations for follow up therapy are one component of a multi-disciplinary discharge planning process, led by the attending physician.  Recommendations may be updated based on patient status, additional functional criteria and insurance authorization.    Assistance Recommended at Discharge Frequent or constant Supervision/Assistance  Patient can return home with the following  A lot of help with walking and/or transfers;A lot of help with bathing/dressing/bathroom;Assistance with cooking/housework;Help with stairs or ramp for entrance;Assist for transportation   Equipment Recommendations  None recommended by OT (defer)    Recommendations for Other Services      Precautions / Restrictions Precautions Precautions: Fall;Other (comment) Precaution Comments: monitor O2 sats, ambulatory O2 may be needed Restrictions Weight Bearing Restrictions: No       Mobility Bed Mobility Overal bed mobility: Needs Assistance Bed Mobility: Supine to Sit     Supine to sit: Min  assist, HOB elevated     General bed mobility comments: minA with pt using bed rail and pulling on PT to come to sitting, increased time for scooting to EOB. Pt left sitting in recliner    Transfers Overall transfer level: Needs assistance Equipment used: 2 person hand held assist, Ambulation equipment used Transfers: Sit to/from Stand, Bed to chair/wheelchair/BSC Sit to Stand: Min assist     Step pivot transfers: Max assist, +2 physical assistance, +2 safety/equipment     General transfer comment: pt able to  complete sit-stand from EOB and BSC x3 with minA and LUE on armrest. good stability in static stance with minA. maxA of 2 with small pivotal steps to recliner due to fear of falling, posterior lean, and retropulsion, pt with feet sliding forwards and needed maxA to maintain upright and to direct steps.     Balance Overall balance assessment: Needs assistance Sitting-balance support: Feet supported, Bilateral upper extremity supported Sitting balance-Leahy Scale: Poor Sitting balance - Comments: EOB   Standing balance support: During functional activity, Reliant on assistive device for balance, Bilateral upper extremity supported Standing balance-Leahy Scale: Poor Standing balance comment: dependent on UE support and minA for static stance, BUE support and maxA for stepping                           ADL either performed or assessed with clinical judgement   ADL Overall ADL's : Needs assistance/impaired                 Upper Body Dressing : Sitting;Moderate assistance Upper Body Dressing Details (indicate cue type and reason): due  to RUE deficits     Toilet Transfer: Moderate assistance;Stand-pivot;Rolling walker (2 wheels)             General ADL Comments: Attempted to progress pt in functional mobility with PT assist, pt with DOE and needing standing rest break after 5 steps. Sp02 desat with ambulation.    Extremity/Trunk Assessment               Vision       Perception     Praxis      Cognition Arousal/Alertness: Awake/alert Behavior During Therapy: WFL for tasks assessed/performed Overall Cognitive Status: History of cognitive impairments - at baseline                                 General Comments: slurred speech and difficulty with word finding. slowed responses but able to follow simple commands. fear of falling with mobility, needs redirection and assist due to impulsivity when scared        Exercises      Shoulder Instructions       General Comments SpO2 to low of 86% after 4 ft ambulaiton    Pertinent Vitals/ Pain       Pain Assessment Pain Assessment: No/denies pain  Home Living                                          Prior Functioning/Environment              Frequency  Min 1X/week        Progress Toward Goals  OT Goals(current goals can now be found in the care plan section)  Progress towards OT goals: Not progressing toward goals - comment (pt not making much functional progress)  Acute Rehab OT Goals OT Goal Formulation: With patient Time For Goal Achievement: 07/28/23 Potential to Achieve Goals: Fair  Plan Discharge plan remains appropriate;Frequency remains appropriate    Co-evaluation    PT/OT/SLP Co-Evaluation/Treatment: Yes Reason for Co-Treatment: For patient/therapist safety PT goals addressed during session: Mobility/safety with mobility OT goals addressed during session: ADL's and self-care      AM-PAC OT "6 Clicks" Daily Activity     Outcome Measure   Help from another person eating meals?: A Little Help from another person taking care of personal grooming?: A Little Help from another person toileting, which includes using toliet, bedpan, or urinal?: A Lot Help from another person bathing (including washing, rinsing, drying)?: A Lot Help from another person to put on and taking off regular upper body clothing?: A  Lot Help from another person to put on and taking off regular lower body clothing?: A Lot 6 Click Score: 14    End of Session Equipment Utilized During Treatment: Gait belt;Rolling walker (2 wheels)  OT Visit Diagnosis: Unsteadiness on feet (R26.81);Other abnormalities of gait and mobility (R26.89);History of falling (Z91.81);Muscle weakness (generalized) (M62.81)   Activity Tolerance Patient limited by fatigue   Patient Left in chair;with call bell/phone within reach;with chair alarm set   Nurse Communication Mobility status        Time: 5366-4403 OT Time Calculation (min): 37 min  Charges: OT General Charges $OT Visit: 1 Visit OT Treatments $Therapeutic Activity: 8-22 mins  07/23/2023  AB, OTR/L  Acute Rehabilitation Services  Office: [phone]   Tristan Schroeder 07/23/2023, 4:21 PM

## 2023-07-23 NOTE — Plan of Care (Signed)
  Problem: Education: Goal: Knowledge of General Education information will improve Description: Including pain rating scale, medication(s)/side effects and non-pharmacologic comfort measures Outcome: Progressing   Problem: Clinical Measurements: Goal: Ability to maintain clinical measurements within normal limits will improve Outcome: Progressing   Problem: Activity: Goal: Risk for activity intolerance will decrease Outcome: Progressing   Problem: Nutrition: Goal: Adequate nutrition will be maintained Outcome: Progressing   Problem: Elimination: Goal: Will not experience complications related to bowel motility Outcome: Progressing   Problem: Safety: Goal: Ability to remain free from injury will improve Outcome: Progressing   Problem: Skin Integrity: Goal: Risk for impaired skin integrity will decrease Outcome: Progressing   

## 2023-07-23 NOTE — Assessment & Plan Note (Addendum)
Had urinary retention with >400 mL on 7/28 PM. Urology placed 16 F catheter on 7/28. Urine culture negative. Void trial 8/1 with appropriate urine output. Last bladder scan with 274 mL.  - Urology consulted, appreciate recommendations - q shift post void residual  - Increased Flomax 0.4 mg daily  to 0.8 mg daily, discuss med adjustment w/ urology if urinary symptoms worsen  - Outpatient fu with [facility] for likely cystoscopy & evaluation

## 2023-07-23 NOTE — Progress Notes (Signed)
Daily Progress Note Intern Pager: [phone]  Patient name: Kent Flowers Medical record number: [id number] Date of birth: 15-Jul-1946 Age: 77 y.o. Gender: male  Primary Care Provider: Earnest Rosier, MD Consultants: Urology Code Status: DNR  Pt Overview and Major Events to Date:  7/29- admitted 7/30- accepted to CIR, pending insurance auth 8/1- void trial   Assessment and Plan: Kent Flowers is a 77 yo M with PMHx of DISH of C spine, COPD not on home oxygen, and chronic pain with narcotic pain medications who was admitted after a fall w neck pain, increased WOB with increased sputum production, and gross hematuria    -      Hospital     * (Principal) Gross hematuria     Had urinary retention with >400 mL on 7/28 PM. Urology placed 16 F  catheter on 7/28. Urine culture negative. Void trial 8/1 with appropriate  urine output. Last bladder scan with 274 mL.  - Urology consulted, appreciate recommendations - q shift post void residual  - Increased Flomax 0.4 mg daily  to 0.8 mg daily, discuss med adjustment  w/ urology if urinary symptoms worsen  - Outpatient fu with [facility] for likely cystoscopy & evaluation          Recurrent falls     Multifactorial: sepsis 2/2 to UTI, poor balance in the setting of  reduced neck range of motion, dehydration. Goal for CIR.  - Fall and delirium precautions        Alcohol use     Reports drinking 2-3 drinks poor day but is not a great historian. D/C  CIWAs.  - continue supplements        Acute on chronic respiratory failure with hypoxia (HCC)     Likely due to CAP, improving. S/P Ceftriaxone (7/26-7/28 ),  Azithromycin (7/27- 7/29), Cefdinir 4 doses (7/28-7/30) S/p solumedrol  125mg  with EMS 7/26 and completed Prednisone 40mg  (7/27 - 7/30). Patient  is not requiring oxygen and ambulating without complaints of worsening  SOB.  - albuterol nebs prn - Continue home Trelegy  - Continue home Albuterol PRN Q4H         AMS (altered mental status)     Family had some concern about AMS. Patient had some agitation  overnight. Patient was oriented and alert on my exam today. Likely some  component of hospital acquired delirium - delirium precautions      Chronic and stable conditions: Back and neck pain: Home regimen: Oxycodone 15 mg q6h and 10 mg at bedtime. Lidocaine patch and heat pack ordered.  MRI:   FEN/GI: Regular PPx: Lovenox Dispo:CIR vs SNF pending appeal.   Subjective:  Patient reports doing okay today.  He has no complaints at this time.  Objective: Temp:  [98.2 F (36.8 C)-98.6 F (37 C)] 98.6 F (37 C) (08/05 0513) Pulse Rate:  [61-94] 61 (08/05 0513) Resp:  [17-18] 17 (08/05 0513) BP: (109-154)/(67-89) 109/67 (08/05 0513) SpO2:  [92 %-97 %] 97 % (08/05 0513) Physical Exam: General: appears stated age, eating breakfast comfortably  Cardiovascular: RRR Respiratory: course breath sounds with mild expiratory breathing, NOWB on RA  Abdomen: slightly distended and hard, not tender to palpation  Extremities: weakness to R upper extremity- unchanged from prior   Laboratory: Most recent CBC Lab Results  Component Value Date   WBC 15.4 (H) 07/20/2023   HGB 12.3 (L) 07/20/2023   HCT 38.7 (L) 07/20/2023   MCV 94.2 07/20/2023   PLT  367 07/20/2023   Most recent BMP    Latest Ref Rng & Units 07/21/2023   12:32 AM  BMP  Glucose 70 - 99 mg/dL 528   BUN 8 - 23 mg/dL 31   Creatinine 4.13 - 1.24 mg/dL 2.44   Sodium 010 - 272 mmol/L 135   Potassium 3.5 - 5.1 mmol/L 3.6   Chloride 98 - 111 mmol/L 101   CO2 22 - 32 mmol/L 27   Calcium 8.9 - 10.3 mg/dL 8.0     Imaging/Diagnostic Tests: No new imaging   Penne Lash, MD 07/23/2023, 3:03 PM  PGY-1, [facility] FPTS Intern pager: [phone], text pages welcome Secure chat group [facility] Teaching Service

## 2023-07-23 NOTE — Plan of Care (Signed)
Patient AOX3. Forgetful. Able to call independently. Continues to await placement. Continues with bladder scans. Post void residual documented by PCT. Pain well managed with scheduled medication.   Problem: Education: Goal: Knowledge of General Education information will improve Description: Including pain rating scale, medication(s)/side effects and non-pharmacologic comfort measures Outcome: Progressing   Problem: Health Behavior/Discharge Planning: Goal: Ability to manage health-related needs will improve Outcome: Progressing   Problem: Clinical Measurements: Goal: Ability to maintain clinical measurements within normal limits will improve Outcome: Progressing Goal: Will remain free from infection Outcome: Progressing Goal: Diagnostic test results will improve Outcome: Progressing Goal: Respiratory complications will improve Outcome: Progressing Goal: Cardiovascular complication will be avoided Outcome: Progressing   Problem: Activity: Goal: Risk for activity intolerance will decrease Outcome: Progressing   Problem: Nutrition: Goal: Adequate nutrition will be maintained Outcome: Progressing   Problem: Coping: Goal: Level of anxiety will decrease Outcome: Progressing   Problem: Elimination: Goal: Will not experience complications related to bowel motility Outcome: Progressing Goal: Will not experience complications related to urinary retention Outcome: Progressing   Problem: Pain Managment: Goal: General experience of comfort will improve Outcome: Progressing   Problem: Safety: Goal: Ability to remain free from injury will improve Outcome: Progressing   Problem: Skin Integrity: Goal: Risk for impaired skin integrity will decrease Outcome: Progressing

## 2023-07-23 NOTE — Progress Notes (Signed)
Physical Therapy Treatment Patient Details Name: Kent Flowers MRN: [id number] DOB: April 08, 1946 Today's Date: 07/23/2023   History of Present Illness 77 y.o. male admitted on 07/13/23 for falls, acute hpoxemia, sepsis, and elevated troponin.  Pt dx with UTI (with hematuria) and possible PNA.  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  Pt with significant PMH of falls, COPD, thoracic laminectomy, L shoulder RTC repair and decompression (2004).    PT Comments  The pt was agreeable to session with focus on progression of OOB mobility and endurance training. Attempted to use sara plus machine to safely facilitate ambulation but limited to ~4 ft by pt need to have BM, and then fatigue and fear of falling. Pt remains able to rise to standing statically with minA and LUE supported, but had significant fear of falling leading to impulsive movements and posterior lean with attempts to take small pivotal steps. Of note, after 4 ft ambulation, pt noticeably short of breath, SpO2 86% on RA. The pt will continue to benefit from intensive skilled PT to address static and dynamic balance as well as endurance needed for gait training progression.    If plan is discharge home, recommend the following: A little help with walking and/or transfers;Assistance with cooking/housework;Assist for transportation;Help with stairs or ramp for entrance   Can travel by private vehicle     Yes  Equipment Recommendations  None recommended by PT    Recommendations for Other Services       Precautions / Restrictions Precautions Precautions: Fall;Other (comment) Precaution Comments: monitor O2 sats, ambulatory O2 may be needed Restrictions Weight Bearing Restrictions: No     Mobility  Bed Mobility Overal bed mobility: Needs Assistance Bed Mobility: Supine to Sit     Supine to sit: Min assist, HOB elevated     General  bed mobility comments: minA with pt using bed rail and pulling on PT to come to sitting, increased time for scooting to EOB    Transfers Overall transfer level: Needs assistance Equipment used: 2 person hand held assist, Ambulation equipment used Transfers: Sit to/from Stand, Bed to chair/wheelchair/BSC Sit to Stand: Min assist   Step pivot transfers: Max assist, +2 physical assistance, +2 safety/equipment       General transfer comment: pt able to  complete sit-stand from EOB and BSC x3 with minA and LUE on armrest. good stability in static stance with minA. maxA of 2 with small pivotal steps to recliner due to fear of falling, posterior lean, and retropulsion, pt with feet sliding forwards and needed maxA to maintain upright and to direct steps.    Ambulation/Gait Ambulation/Gait assistance: Mod assist, +2 physical assistance, +2 safety/equipment Gait Distance (Feet): 4 Feet Assistive device:  (sara plus (bilateral platform RW)) Gait Pattern/deviations: Step-to pattern, Shuffle, Decreased dorsiflexion - right, Decreased stance time - left, Decreased dorsiflexion - left, Decreased weight shift to left Gait velocity: decreased cadence Gait velocity interpretation: <1.31 ft/sec, indicative of household ambulator   General Gait Details: short shuffling steps, pt with minimal clearance bilaterally and assist to prevent posterior LOB     Balance Overall balance assessment: Needs assistance Sitting-balance support: Feet supported, Bilateral upper extremity supported Sitting balance-Leahy Scale: Poor Sitting balance - Comments: EOB   Standing balance support: During functional activity, Reliant on assistive device for balance, Bilateral upper extremity supported Standing balance-Leahy Scale: Poor Standing balance comment: dependent on SUE support and minA for static stance, BUE  support and maxA for stepping                            Cognition Arousal/Alertness:  Awake/alert Behavior During Therapy: WFL for tasks assessed/performed Overall Cognitive Status: History of cognitive impairments - at baseline                                 General Comments: slurred speech and difficulty with word finding. slowed responses but able to follow simple commands. fear of falling with mobility, needs redirection and assist due to impulsivity when scared        Exercises      General Comments General comments (skin integrity, edema, etc.): SpO2 to low of 86% after 4 ft ambulaiton      Pertinent Vitals/Pain Pain Assessment Pain Assessment: No/denies pain Pain Intervention(s): Monitored during session     PT Goals (current goals can now be found in the care plan section) Acute Rehab PT Goals Patient Stated Goal: to go to rehab and get stronger PT Goal Formulation: With patient/family Time For Goal Achievement: 07/28/23 Potential to Achieve Goals: Good Progress towards PT goals: Progressing toward goals    Frequency    Min 1X/week      PT Plan Current plan remains appropriate    Co-evaluation PT/OT/SLP Co-Evaluation/Treatment: Yes Reason for Co-Treatment: For patient/therapist safety          AM-PAC PT "6 Clicks" Mobility   Outcome Measure  Help needed turning from your back to your side while in a flat bed without using bedrails?: A Little Help needed moving from lying on your back to sitting on the side of a flat bed without using bedrails?: A Little Help needed moving to and from a bed to a chair (including a wheelchair)?: Total Help needed standing up from a chair using your arms (e.g., wheelchair or bedside chair)?: A Lot Help needed to walk in hospital room?: Total Help needed climbing 3-5 steps with a railing? : Total 6 Click Score: 11    End of Session Equipment Utilized During Treatment: Gait belt Activity Tolerance: Patient limited by fatigue Patient left: in chair;with call bell/phone within reach;with  chair alarm set Nurse Communication: Mobility status PT Visit Diagnosis: Muscle weakness (generalized) (M62.81);Difficulty in walking, not elsewhere classified (R26.2);Pain     Time: 1610-9604 PT Time Calculation (min) (ACUTE ONLY): 39 min  Charges:    $Gait Training: 8-22 mins $Therapeutic Activity: 8-22 mins PT General Charges $$ ACUTE PT VISIT: 1 Visit                     Vickki Muff, PT, DPT   Acute Rehabilitation Department Office [phone] Secure Chat Communication Preferred   Ronnie Derby 07/23/2023, 3:18 PM

## 2023-07-23 NOTE — Progress Notes (Signed)
Messaged by nursing about patient's albuterol inhaler. RN states patient is fixated on albuterol inhaler since start of shift-found an unlabeled albuterol inhaler in his drawer for which there is no order for.  Has been getting albuterol nebulizer treatments and responding well to this however really wants to use his inhaler.  They have discussed with him several times that there is no order for it.  At one point during the night was threatening to leave AMA due to this.  Charge RN had found albuterol inhaler in his pocket.  I discussed that I can place an order for albuterol inhaler which I did  This order was discontinued by pharmacy as patient needs to use formulary equivalent in the hospital per policy (nebulizer only when I discussed with pharmacy team)  Per RN patient is not giving up his inhaler and is much more compliant with it.  If patient states that he is leaving please call our provider team to assess patient at bedside for capacity  I believe there is likely a component of delirium that is causing him to be fixated on this albuterol inhaler  He is no longer stating he wants to leave  He is not in any respiratory distress and resting per RN  I will relay all of this to our day team providers as well

## 2023-07-23 NOTE — Assessment & Plan Note (Signed)
Family had some concern about AMS. Patient had some agitation overnight. Patient was oriented and alert on my exam today. Likely some component of hospital acquired delirium - delirium precautions

## 2023-07-23 NOTE — Progress Notes (Signed)
Inpatient Rehab Admissions Coordinator:   [facility] has asked for an updated AOR form which we will send this AM.  I spoke to a representative on the phone who said case would be escalated upon receipt of the form.   Estill Dooms, PT, DPT Admissions Coordinator [phone] 07/23/23  11:03 AM

## 2023-07-23 NOTE — Assessment & Plan Note (Signed)
Likely due to CAP, improving. S/P Ceftriaxone (7/26-7/28 ), Azithromycin (7/27- 7/29), Cefdinir 4 doses (7/28-7/30) S/p solumedrol 125mg  with EMS 7/26 and completed Prednisone 40mg  (7/27 - 7/30). Patient is not requiring oxygen and ambulating without complaints of worsening SOB.  - albuterol nebs prn - Continue home Trelegy  - Continue home Albuterol PRN Q4H

## 2023-07-24 DIAGNOSIS — R310 Gross hematuria: Secondary | ICD-10-CM | POA: Diagnosis not present

## 2023-07-24 MED ORDER — ZINC OXIDE 40 % EX OINT
1.0000 | TOPICAL_OINTMENT | Freq: Two times a day (BID) | CUTANEOUS | Status: DC | PRN
  Filled 2023-07-24: qty 57

## 2023-07-24 NOTE — Progress Notes (Signed)
Inpatient Rehab Admissions Coordinator:   Continue to await determination from [facility] Medicare regarding expedited appeal request.  I faxed updated clinicals this AM per their request.    Estill Dooms, PT, DPT Admissions Coordinator [phone] 07/24/23  12:04 PM

## 2023-07-24 NOTE — Assessment & Plan Note (Signed)
Urology placed 16 F catheter on 7/28. Urine culture negative. Void trial 8/1 with appropriate urine output. Last postvoid residual with 134 ml. - Urology consulted, appreciate recommendations - q shift post void residual  - Increased Flomax 0.4 mg daily  to 0.8 mg daily, discuss med adjustment w/ urology if urinary symptoms worsen  - Outpatient fu with [facility] for likely cystoscopy & evaluation

## 2023-07-24 NOTE — Plan of Care (Signed)
Patient is Kent Flowers. Forgetful. Able to use call bell independently. Has one large BM this shift. Voids using urinal. Still having hematuria. RA. VSS. Continues with expiratory wheezing. Some DOE. Scheduled pain medication effective.   Problem: Education: Goal: Knowledge of General Education information will improve Description: Including pain rating scale, medication(s)/side effects and non-pharmacologic comfort measures Outcome: Progressing   Problem: Health Behavior/Discharge Planning: Goal: Ability to manage health-related needs will improve Outcome: Progressing   Problem: Clinical Measurements: Goal: Ability to maintain clinical measurements within normal limits will improve Outcome: Progressing Goal: Will remain free from infection Outcome: Progressing Goal: Diagnostic test results will improve Outcome: Progressing Goal: Respiratory complications will improve Outcome: Progressing Goal: Cardiovascular complication will be avoided Outcome: Progressing   Problem: Activity: Goal: Risk for activity intolerance will decrease Outcome: Progressing   Problem: Nutrition: Goal: Adequate nutrition will be maintained Outcome: Progressing   Problem: Coping: Goal: Level of anxiety will decrease Outcome: Progressing   Problem: Elimination: Goal: Will not experience complications related to bowel motility Outcome: Progressing Goal: Will not experience complications related to urinary retention Outcome: Progressing   Problem: Pain Managment: Goal: General experience of comfort will improve Outcome: Progressing   Problem: Safety: Goal: Ability to remain free from injury will improve Outcome: Progressing   Problem: Skin Integrity: Goal: Risk for impaired skin integrity will decrease Outcome: Progressing

## 2023-07-24 NOTE — Assessment & Plan Note (Signed)
Multifactorial: sepsis 2/2 to UTI, poor balance in the setting of reduced neck range of motion, dehydration. Goal for CIR.  - Fall and delirium precautions

## 2023-07-24 NOTE — Assessment & Plan Note (Signed)
Reports drinking 2-3 drinks poor day but is not a great historian. D/C CIWAs.  - continue supplements

## 2023-07-24 NOTE — Assessment & Plan Note (Addendum)
Likely due to CAP, improving. S/P Ceftriaxone (7/26-7/28 ), Azithromycin (7/27- 7/29), Cefdinir 4 doses (7/28-7/30) S/p solumedrol 125mg  with EMS 7/26 and completed Prednisone 40mg  (7/27 - 7/30).  Patient is not currently requiring oxygen however did have ambulatory desat to 86% after walking with PT/OT yesterday.  Physical exam is unchanged from prior. -Will follow-up with PT OT today regarding oxygen saturation - Continue home Trelegy (equivalent is breo ellipta and incruse ellipta)  - Continue home Albuterol PRN Q4H

## 2023-07-24 NOTE — Assessment & Plan Note (Signed)
No acute events overnight.  Patient was oriented and alert on my exam today. - delirium precautions

## 2023-07-24 NOTE — Progress Notes (Signed)
Daily Progress Note Intern Pager: [phone]  Patient name: Kent Flowers [facility] Medical record number: [id number] Date of birth: 1946-05-05 Age: 77 y.o. Gender: male  Primary Care Provider: Earnest Rosier, MD Consultants: Urology Code Status: DNR  Pt Overview and Major Events to Date:  7/29- admitted 7/30- accepted to CIR, pending insurance auth 8/1- void trial  8/2-insurance denied CIR, pending appeal  Assessment and Plan: Kent Flowers is a 77 year old male with past medical history of DISH of C-spine, COPD not on home oxygen, chronic pain with narcotic medication admitted after fall with neck pain and is currently being treated for gross hematuria with COPD exacerbation likely due to CAP. [facility]     * (Principal) Gross hematuria     Urology placed 16 F catheter on 7/28. Urine culture negative. Void  trial 8/1 with appropriate urine output. Last postvoid residual with 134  ml. - Urology consulted, appreciate recommendations - q shift post void residual  - Increased Flomax 0.4 mg daily  to 0.8 mg daily, discuss med adjustment  w/ urology if urinary symptoms worsen  - Outpatient fu with [facility] for likely cystoscopy & evaluation          Recurrent falls     Multifactorial: sepsis 2/2 to UTI, poor balance in the setting of  reduced neck range of motion, dehydration. Goal for CIR.  - Fall and delirium precautions        Alcohol use     Reports drinking 2-3 drinks poor day but is not a great historian. D/C  CIWAs.  - continue supplements        Acute on chronic respiratory failure with hypoxia (HCC)     Likely due to CAP, improving. S/P Ceftriaxone (7/26-7/28 ),  Azithromycin (7/27- 7/29), Cefdinir 4 doses (7/28-7/30) S/p solumedrol  125mg  with EMS 7/26 and completed Prednisone 40mg  (7/27 - 7/30).  Patient  is not currently requiring oxygen however did have ambulatory desat to 86%  after walking with PT/OT yesterday.  Physical exam is unchanged from   prior. -Will follow-up with PT OT today regarding oxygen saturation - Continue home Trelegy (equivalent is breo ellipta and incruse ellipta)  - Continue home Albuterol PRN Q4H        AMS (altered mental status)     No acute events overnight.  Patient was oriented and alert on my exam  today. - delirium precautions       FEN/GI: Regular PPx: Lovenox Dispo: Patient is medically stable at this time.  Barriers to discharge include CIR acceptance  Subjective:  Patient seen sitting at bedside chair. Patient reports he is doing well and has some changes in his skin to the R lower leg. He has no other complaints at this time.   Objective: Temp:  [97.4 F (36.3 C)-98.9 F (37.2 C)] 97.4 F (36.3 C) (08/06 0516) Pulse Rate:  [55-61] 55 (08/06 0516) Resp:  [18] 18 (08/05 1605) BP: (111-128)/(57-68) 111/57 (08/06 0516) SpO2:  [95 %-98 %] 98 % (08/06 0516) Physical Exam: General: resting comfortably in bed, NAD Cardiovascular: RRR Respiratory: course breath sounds, worse on the R side, NWOB on RA Abdomen: slightly distended, soft, normal bowels sounds  Extremities: decreased ROM on R hand, unchanged from prior   Laboratory: Most recent CBC Lab Results  Component Value Date   WBC 15.4 (H) 07/20/2023   HGB 12.3 (L) 07/20/2023   HCT 38.7 (L) 07/20/2023   MCV 94.2 07/20/2023  PLT 367 07/20/2023   Most recent BMP    Latest Ref Rng & Units 07/21/2023   12:32 AM  BMP  Glucose 70 - 99 mg/dL 259   BUN 8 - 23 mg/dL 31   Creatinine 5.63 - 1.24 mg/dL 8.75   Sodium 643 - 329 mmol/L 135   Potassium 3.5 - 5.1 mmol/L 3.6   Chloride 98 - 111 mmol/L 101   CO2 22 - 32 mmol/L 27   Calcium 8.9 - 10.3 mg/dL 8.0      Imaging/Diagnostic Tests: No new imaging   Penne Lash, MD 07/24/2023, 11:44 AM  PGY-1, [facility] FPTS Intern pager: [phone], text pages welcome Secure chat group [facility] Teaching Service

## 2023-07-25 DIAGNOSIS — R296 Repeated falls: Secondary | ICD-10-CM | POA: Diagnosis not present

## 2023-07-25 MED ORDER — WHITE PETROLATUM EX OINT
TOPICAL_OINTMENT | Freq: Every day | CUTANEOUS | Status: DC
  Administered 2023-07-25 – 2023-08-07 (×6): 0.2 via TOPICAL
  Filled 2023-07-25 (×5): qty 28.35

## 2023-07-25 NOTE — Assessment & Plan Note (Addendum)
Poor balance in the setting of reduced neck ROM, PT/OT recommended CIR.  - Awaiting appeal as original [name] was denied.

## 2023-07-25 NOTE — Assessment & Plan Note (Signed)
Urology placed 16 F catheter on 7/28. Urine culture negative. Void trial 8/1 with appropriate urine output and post void residuals. Patient continues to have gross hematuria. Urology has signed off.  - continue increased dose of flomax, 0.8mg  daily  - q shift post void residual  - Outpatient fu with [facility] for likely cystoscopy & evaluation

## 2023-07-25 NOTE — Progress Notes (Signed)
     Daily Progress Note Intern Pager: [phone]  Patient name: Kent Flowers Medical record number: [id number] Date of birth: Sep 06, 1946 Age: 77 y.o. Gender: male  Primary Care Provider: Earnest Rosier, MD Consultants: Urology  Code Status: DNR  Pt Overview and Major Events to Date:  7/29- admitted 7/30- accepted to CIR, pending insurance auth 8/1- void trial  8/2-insurance denied CIR, pending appeal  Assessment and Plan: Kent Flowers is a 77 yo M with PMHx of DISH of C spine with chronic pain management, COPD not on oxygen admitted for COPD exacerbation 2/2 to CAP and gross hematuria. Patient is stable for discharge but awaiting CIR appeal decision.   Kent Flowers     * (Principal) Recurrent falls     Poor balance in the setting of reduced neck ROM, PT/OT recommended CIR.   - Awaiting appeal as original Kent Flowers was denied.         Gross hematuria     Urology placed 16 F catheter on 7/28. Urine culture negative. Void  trial 8/1 with appropriate urine output and post void residuals. Patient  continues to have gross hematuria. Urology has signed off.  - continue increased dose of flomax, 0.8mg  daily  - q shift post void residual  - Outpatient fu with [facility] for likely cystoscopy & evaluation      Chronic and stable conditions: Hx of Alcohol Use: No signs of alcohol withdrawal during this admission. Will continue supplements Alzheimer dementia: Delirium precautions Hx of falls: poor balance in the setting of reduced neck ROM, PT/OT recommended CIR. Awaiting appeal as original Kent Flowers was denied.  COPD: admitted for COPD exacerbation, likely 2/2 to CAP. Patient completed therapy and appears at baseline. Continue home trelegy and albuterol prn.   FEN/GI: Regular PPx: Lovenox Dispo:Stable for d/c. Barriers include CIR approval.   Subjective:  Patient reports feeling okay this AM. He is questioning why [facility] is still at [facility]. He reports no other complaints.    Objective: Temp:  [97.4 F (36.3 C)-98.7 F (37.1 C)] 98 F (36.7 C) (08/07 0828) Pulse Rate:  [55-74] 74 (08/07 0847) Resp:  [17-18] 18 (08/07 0847) BP: (120-131)/(64-72) 131/72 (08/07 0828) SpO2:  [93 %-95 %] 94 % (08/07 0847) Physical Exam: General: Age appropriate, NAD Cardiovascular: RRR, no murmurs Respiratory: coarse breath sounds diffusely, unchanged from prior  Abdomen: mildly distended, NT, normal bowel sounds  Extremities: chronic weakness to R upper extremity, unchanged from prior   Laboratory: Most recent CBC Lab Results  Component Value Date   WBC 11.6 (H) 07/25/2023   HGB 11.3 (L) 07/25/2023   HCT 36.4 (L) 07/25/2023   MCV 94.3 07/25/2023   PLT 366 07/25/2023   Most recent BMP    Latest Ref Rng & Units 07/21/2023   12:32 AM  BMP  Glucose 70 - 99 mg/dL 528   BUN 8 - 23 mg/dL 31   Creatinine 4.13 - 1.24 mg/dL 2.44   Sodium 010 - 272 mmol/L 135   Potassium 3.5 - 5.1 mmol/L 3.6   Chloride 98 - 111 mmol/L 101   CO2 22 - 32 mmol/L 27   Calcium 8.9 - 10.3 mg/dL 8.0      Imaging/Diagnostic Tests: No new imaging  Kent Lash, MD 07/25/2023, 9:33 AM  PGY-1, [facility] FPTS Intern pager: [phone], text pages welcome Secure chat group [facility] Teaching Service

## 2023-07-25 NOTE — Progress Notes (Addendum)
Inpatient Rehab Admissions Coordinator:   Received a denial from [facility] Medicare.  Team aware.  Will notify family.    Estill Dooms, PT, DPT Admissions Coordinator [phone] 07/25/23  10:08 AM

## 2023-07-25 NOTE — Progress Notes (Signed)
Physical Therapy Treatment Patient Details Name: NASIR CAM MRN: [id number] DOB: 1946-06-28 Today's Date: 07/25/2023   History of Present Illness 77 y.o. male admitted on 07/13/23 for falls, acute hpoxemia, sepsis, and elevated troponin.  Pt dx with UTI (with hematuria) and possible PNA.  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  Pt with significant PMH of falls, COPD, thoracic laminectomy, L shoulder RTC repair and decompression (2004).    PT Comments  Patient OOB at start of session and eager to participate in therapy today. Start of session seated reaching completed to challenge anterior weight shift with ipsi/contra-lateral reaching to move object on ground from once location to another. Patient demonstrated improved initiation with mobility and improved anterior weight with sit<>stand required min manual cue/assist to deter posterior lean with stand. Verbal cues needed for sequencing hand placement for power up and for transition from arm rest to RW. Pt amb 2x~30' with min assist to manage RW and steady balance to maintain anterior lean. Step remain shuffled with low foot clearance and festinating steps, pt improved in clear hallway for forward gait with no turns or transition/obstacles. Pt reporting urge for BM and ambulatory transfer completed with RW for toileting. Pt amb ~8' in bathroom with min assist to guide turn and cues for grab bar use during toilet transfer. Pt unsuccessful with BM and min assist provided to rise from toilet and amb ~8' to recliner. EOS pt agreeable to remain OOB. With substantial improved and tolerance to mobility today continue to recommend intensive inpatient follow up therapy, >3 hours/day. Will continue to progress as able.     If plan is discharge home, recommend the following: Assistance with cooking/housework;Assist for transportation;Help with stairs or ramp for  entrance;A lot of help with walking and/or transfers;Supervision due to cognitive status;A lot of help with bathing/dressing/bathroom   Can travel by private vehicle     Yes  Equipment Recommendations  None recommended by PT    Recommendations for Other Services Rehab consult     Precautions / Restrictions Precautions Precautions: Fall;Other (comment) Precaution Comments: monitor O2 sats, ambulatory O2 may be needed Restrictions Weight Bearing Restrictions: No     Mobility  Bed Mobility               General bed mobility comments: pt OOB in recliner    Transfers Overall transfer level: Needs assistance   Transfers: Sit to/from Stand, Bed to chair/wheelchair/BSC Sit to Stand: Min assist   Step pivot transfers: Min assist       General transfer comment: min assist for power up from recliner with cues for bil UE placement on armrests to initiate rise. manual cue for anterior trunk weight shift with rise to deter posterior lean. Min assist required for turn/step pivot with toilet transfer. cues to guide hand transition from RW to grab bar.    Ambulation/Gait Ambulation/Gait assistance: Min assist, +2 safety/equipment Gait Distance (Feet): 30 Feet (30x2) Assistive device: Rolling walker (2 wheels) Gait Pattern/deviations: Step-through pattern, Decreased step length - right, Decreased stride length, Shuffle, Decreased dorsiflexion - right, Decreased dorsiflexion - left, Trunk flexed Gait velocity: decr     General Gait Details: Min assist to faciliate advancement of RW and anterior weight shift/trunk lean for forward gait. Pt with shuffled low steps more limited on Rt than Lt. VC's to "kick" LE for improved foot clearance and step length minimally effective.   Stairs  Wheelchair Mobility     Tilt Bed    Modified Rankin (Stroke Patients Only)       Balance Overall balance assessment: Needs assistance Sitting-balance support: Feet  supported, Bilateral upper extremity supported Sitting balance-Leahy Scale: Fair Sitting balance - Comments: Seated balance activity to facilitate anterior weight shifting. (see exercises)   Standing balance support: During functional activity, Reliant on assistive device for balance, Bilateral upper extremity supported Standing balance-Leahy Scale: Poor                              Cognition Arousal: Alert Behavior During Therapy: WFL for tasks assessed/performed Overall Cognitive Status: History of cognitive impairments - at baseline                                 General Comments: slurred speech and difficulty with word finding. slowed responses but able to follow simple commands. fear of falling with mobility, needs redirection and assist due to impulsivity when scared        Exercises Other Exercises Other Exercises: Seated Anterior weight shift with object pick up. Pt reaching with Lt UE to pick up soap and move it from Rt<>Lt side of towel on floor to move from ipsilateral to contralateral reach. pt completed 10 reps.    General Comments        Pertinent Vitals/Pain Pain Assessment Pain Assessment: No/denies pain    Home Living                          Prior Function            PT Goals (current goals can now be found in the care plan section) Acute Rehab PT Goals Patient Stated Goal: to go to rehab and get stronger PT Goal Formulation: With patient/family Time For Goal Achievement: 07/28/23 Potential to Achieve Goals: Good Progress towards PT goals: Progressing toward goals    Frequency    Min 1X/week      PT Plan Current plan remains appropriate    Co-evaluation              AM-PAC PT "6 Clicks" Mobility   Outcome Measure  Help needed turning from your back to your side while in a flat bed without using bedrails?: A Little Help needed moving from lying on your back to sitting on the side of a flat bed  without using bedrails?: A Little Help needed moving to and from a bed to a chair (including a wheelchair)?: A Little Help needed standing up from a chair using your arms (e.g., wheelchair or bedside chair)?: A Little Help needed to walk in hospital room?: A Little Help needed climbing 3-5 steps with a railing? : Total 6 Click Score: 16    End of Session Equipment Utilized During Treatment: Gait belt Activity Tolerance: Patient tolerated treatment well Patient left: in chair;with call bell/phone within reach;with chair alarm set Nurse Communication: Mobility status PT Visit Diagnosis: Muscle weakness (generalized) (M62.81);Difficulty in walking, not elsewhere classified (R26.2);Pain     Time: 1324-4010 PT Time Calculation (min) (ACUTE ONLY): 30 min  Charges:    $Gait Training: 8-22 mins $Therapeutic Activity: 8-22 mins PT General Charges $$ ACUTE PT VISIT: 1 Visit                      Q.  PT, DPT Acute Rehabilitation Services Office [phone]  07/25/23 4:56 PM

## 2023-07-25 NOTE — TOC Progression Note (Addendum)
Transition of Care [facility]) - Progression Note    Patient Details  Name: Kent Flowers MRN: [id number] Date of Birth: 1946-12-16  [facility]) CM/SW Contact  Carley Hammed, LCSW Phone Number: 07/25/2023, 11:51 AM  Clinical Narrative:    CSW notified by CIR rep that insurance has denied for pt to pursue CIR placement. Family states they are pursuing a [employer] appeal, as they still believe pt should go to CIR. Pt states he does not want to go anywhere else. CSW spoke with son who states they will consider SNF as a backup. Current bed offers provided and family is interested in [facility]. They state pt was at [facility], but they do not want to return. Family notes they had a family member at [facility], and would like a referral sent. CSW left VM to discuss with [facility] rep. CSW spoke with [name] who is agreeable to accept pt at [place], if appeal is denied again. TOC will continue to follow for DC needs.   Cherlyn Roberts [phone]  Expected Discharge Plan: IP Rehab Facility Barriers to Discharge: Insurance Authorization  Expected Discharge Plan and Services       Living arrangements for the past 2 months: Independent Living Facility, Single Family Home                                       Social Determinants of Health (SDOH) Interventions SDOH Screenings   Food Insecurity: No Food Insecurity (07/22/2023)  Housing: Low Risk  (07/22/2023)  Transportation Needs: No Transportation Needs (07/22/2023)  Utilities: Not At Risk (07/22/2023)  Alcohol Screen: Low Risk  (03/02/2020)  Depression (PHQ2-9): Low Risk  (03/16/2021)  Financial Resource Strain: Low Risk  (03/02/2020)  Physical Activity: Inactive (03/02/2020)  Social Connections: Unknown (05/02/2022)   Received [facility]  Stress: Stress Concern Present (03/02/2020)  Tobacco Use: Medium Risk (07/13/2023)    Readmission Risk Interventions     No data to display

## 2023-07-26 DIAGNOSIS — J9621 Acute and chronic respiratory failure with hypoxia: Secondary | ICD-10-CM | POA: Diagnosis not present

## 2023-07-26 DIAGNOSIS — R296 Repeated falls: Secondary | ICD-10-CM | POA: Diagnosis not present

## 2023-07-26 NOTE — Assessment & Plan Note (Signed)
Poor balance in the setting of reduced neck ROM, PT/OT recommended CIR. Patient was denied CIR and appeal was also denied. Family is going to appeal again.  - Awaiting second appeal as original auth & first appeal was denied.

## 2023-07-26 NOTE — TOC Progression Note (Signed)
Transition of Care [facility]) - Progression Note    Patient Details  Name: Kent Flowers MRN: [id number] Date of Birth: [birth year]  Transition of Care [facility]) CM/SW Contact  Carley Hammed, LCSW Phone Number: 07/26/2023, 11:33 AM  Clinical Narrative:    CSW followed up with pt's family, [employer] appeal decision has not been made yet. Additional bed offers discussed with family, they were advised that [facility] was full at this time. Additional care levels discussed with family. Family notes they will call the insurance company to discuss options. Family states they may be interested in a [facility] facility. Process for out of area placements discussed, including the cost of transportation. Family states they will discuss and follow up with CSW with any additional information. TOC will continue to follow.    Expected Discharge Plan: IP Rehab Facility Barriers to Discharge: Insurance Authorization  Expected Discharge Plan and Services       Living arrangements for the past 2 months: Independent Living Facility, Single Family Home                                       Social Determinants of Health (SDOH) Interventions SDOH Screenings   Food Insecurity: No Food Insecurity (07/22/2023)  Housing: Low Risk  (07/22/2023)  Transportation Needs: No Transportation Needs (07/22/2023)  Utilities: Not At Risk (07/22/2023)  Alcohol Screen: Low Risk  (03/02/2020)  Depression (PHQ2-9): Low Risk  (03/16/2021)  Financial Resource Strain: Low Risk  (03/02/2020)  Physical Activity: Inactive (03/02/2020)  Social Connections: Unknown (05/02/2022)   Received from [facility], [facility]  Stress: Stress Concern Present (03/02/2020)  Tobacco Use: Medium Risk (07/13/2023)    Readmission Risk Interventions     No data to display

## 2023-07-26 NOTE — Plan of Care (Signed)
?  Problem: Clinical Measurements: ?Goal: Will remain free from infection ?Outcome: Progressing ?  ?

## 2023-07-26 NOTE — Assessment & Plan Note (Signed)
Urology placed 16 F catheter on 7/28. Urine culture negative. Void trial 8/1 with appropriate urine output and post void residuals. Patient continues to have gross hematuria. Urology has signed off.  - continue increased dose of flomax, 0.8mg  daily  - Outpatient fu with [facility] for likely cystoscopy & evaluation

## 2023-07-26 NOTE — Progress Notes (Signed)
Daily Progress Note Intern Pager: [phone]  Patient name: Kent Flowers Medical record number: [id number] Date of birth: 09-23-1946 Age: 77 y.o. Gender: male  Primary Care Provider: Earnest Rosier, MD Consultants: Urology Code Status: DNR  Pt Overview and Major Events to Date:  7/29- admitted 7/30- accepted to CIR, pending insurance auth 8/1- void trial  8/2-insurance denied CIR, pending appeal   Assessment and Plan: Kent Flowers is a 77 yo M with PMHx of DISH of C spine with chronic pain management, COPD not on oxygen admitted for COPD exacerbation 2/2 to CAP and gross hematuria. Patient is stable for discharge but awaiting CIR appeal decision.   [facility]     * (Principal) Recurrent falls     Poor balance in the setting of reduced neck ROM, PT/OT recommended CIR.  Patient was denied CIR and appeal was also denied. Family is going to  appeal again.  - Awaiting second appeal as original auth & first appeal was denied.         Gross hematuria     Urology placed 16 F catheter on 7/28. Urine culture negative. Void  trial 8/1 with appropriate urine output and post void residuals. Patient  continues to have gross hematuria. Urology has signed off.  - continue increased dose of flomax, 0.8mg  daily  - Outpatient fu with [facility] for likely cystoscopy & evaluation        Chronic and stable conditions: Hx of Alcohol Use: No signs of alcohol withdrawal during this admission. Will continue supplements Alzheimer dementia: Delirium precautions Hx of falls: poor balance in the setting of reduced neck ROM, PT/OT recommended CIR. Awaiting appeal as original [name] was denied.  COPD: admitted for COPD exacerbation, likely 2/2 to CAP. Patient completed therapy and appears at baseline. Continue home trelegy and albuterol prn.     FEN/GI: Regular PPx: Lovenox Dispo:Stable for d/c. Barriers include CIR appeal vs SNF.   Subjective:  Patient reports that he is not  doing well today.  He states that "everything is going wrong."  He states that his breathing is better this morning.  He reports that he has no other complaints.  Objective: Temp:  [97.6 F (36.4 C)-98.3 F (36.8 C)] 97.6 F (36.4 C) (08/08 0805) Pulse Rate:  [62-76] 62 (08/08 0805) Resp:  [14-16] 14 (08/08 0805) BP: (131-153)/(74-91) 141/74 (08/08 0805) SpO2:  [93 %-96 %] 95 % (08/08 0827) Physical Exam: General: Age-appropriate older white male, no acute distress Cardiovascular: Regular rate and rhythm Respiratory: Coarse breath sounds diffusely, slightly less coarse from yesterday Abdomen: Mildly distended, not tender, normal bowel sounds Extremities: Weakness to right upper extremity, unchanged from prior  Laboratory: Most recent CBC Lab Results  Component Value Date   WBC 11.6 (H) 07/25/2023   HGB 11.3 (L) 07/25/2023   HCT 36.4 (L) 07/25/2023   MCV 94.3 07/25/2023   PLT 366 07/25/2023   Most recent BMP    Latest Ref Rng & Units 07/21/2023   12:32 AM  BMP  Glucose 70 - 99 mg/dL 147   BUN 8 - 23 mg/dL 31   Creatinine 8.29 - 1.24 mg/dL 5.62   Sodium 130 - 865 mmol/L 135   Potassium 3.5 - 5.1 mmol/L 3.6   Chloride 98 - 111 mmol/L 101   CO2 22 - 32 mmol/L 27   Calcium 8.9 - 10.3 mg/dL 8.0      Imaging/Diagnostic Tests: No new imaging  , ,  MD 07/26/2023, 9:45 AM  PGY-1, [facility] FPTS Intern pager: [phone], text pages welcome Secure chat group [facility] Teaching Service

## 2023-07-27 DIAGNOSIS — R296 Repeated falls: Secondary | ICD-10-CM | POA: Diagnosis not present

## 2023-07-27 LAB — GLUCOSE, CAPILLARY: Glucose-Capillary: 123 mg/dL — ABNORMAL HIGH (ref 70–99)

## 2023-07-27 NOTE — Progress Notes (Signed)
Occupational Therapy Treatment Patient Details Name: Kent Flowers MRN: [id number] DOB: 12-12-1946 Today's Date: 07/27/2023   History of present illness 77 y.o. male admitted on 07/13/23 for falls, acute hpoxemia, sepsis, and elevated troponin.  Pt dx with UTI (with hematuria) and possible PNA.  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  Pt with significant PMH of falls, COPD, thoracic laminectomy, L shoulder RTC repair and decompression (2004).   OT comments  Pt progressing well in OT session, he is completing STS with CGA and ambulating to the bathroom but he does need assist for RW management. Pt demonstrated improved activity tolerance than on IE but does require standing rest breaks in between multiple steps. He was limited in mobility by kidney pain and elected to return back to sitting position. OT to continue to progress pt as able. Patient has the potential to reach Mod I and demos the ability to tolerate 3 hours of therapy. Pt would benefit from an intensive rehab program to help maximize functional independence.       If plan is discharge home, recommend the following:  A lot of help with bathing/dressing/bathroom;Assistance with cooking/housework;Help with stairs or ramp for entrance;Assist for transportation;A little help with walking and/or transfers   Equipment Recommendations  None recommended by OT (defer to next level of care)    Recommendations for Other Services      Precautions / Restrictions Precautions Precautions: Fall;Other (comment) Precaution Comments: monitor O2 sats, ambulatory O2 may be needed Restrictions Weight Bearing Restrictions: No       Mobility Bed Mobility               General bed mobility comments: Pt rec'd and left sitting in recliner    Transfers Overall transfer level: Needs assistance Equipment used: None Transfers: Sit to/from  Stand Sit to Stand: Contact guard assist           General transfer comment: STS from  toilet CGA     Balance Overall balance assessment: Needs assistance Sitting-balance support: Feet supported, Bilateral upper extremity supported Sitting balance-Leahy Scale: Fair     Standing balance support: During functional activity, Reliant on assistive device for balance, Bilateral upper extremity supported Standing balance-Leahy Scale: Poor                             ADL either performed or assessed with clinical judgement   ADL Overall ADL's : Needs assistance/impaired     Grooming: Standing;Contact guard assist               Lower Body Dressing: Sitting/lateral leans;Maximal assistance Lower Body Dressing Details (indicate cue type and reason): socks Toilet Transfer: Contact guard assist;Rolling walker (2 wheels);Ambulation           Functional mobility during ADLs: Rolling walker (2 wheels);Minimal assistance General ADL Comments: Pt making gains with functional mobility, he needs assist with RW management due to impaired gross grasp with RUE    Extremity/Trunk Assessment              Vision       Perception     Praxis      Cognition Arousal: Alert Behavior During Therapy: WFL for tasks assessed/performed Overall Cognitive Status: History of cognitive impairments - at baseline  General Comments: difficulty with word finding. slowed responses but able to follow simple commands.        Exercises      Shoulder Instructions       General Comments      Pertinent Vitals/ Pain       Pain Assessment Pain Assessment: 0-10 Pain Score: 8  Pain Location: "Kidneys" Pain Descriptors / Indicators: Aching, Discomfort Pain Intervention(s): Limited activity within patient's tolerance, Monitored during session, Repositioned  Home Living                                           Prior Functioning/Environment              Frequency  Min 1X/week        Progress Toward Goals  OT Goals(current goals can now be found in the care plan section)  Progress towards OT goals: Progressing toward goals  Acute Rehab OT Goals Patient Stated Goal: to improve balance and functional strength OT Goal Formulation: With patient Time For Goal Achievement: 07/28/23 Potential to Achieve Goals: Fair ADL Goals Pt Will Perform Grooming: standing;with supervision Pt Will Transfer to Toilet: with supervision;ambulating  Plan Discharge plan remains appropriate;Frequency remains appropriate    Co-evaluation                 AM-PAC OT "6 Clicks" Daily Activity     Outcome Measure   Help from another person eating meals?: A Little Help from another person taking care of personal grooming?: A Little Help from another person toileting, which includes using toliet, bedpan, or urinal?: A Little Help from another person bathing (including washing, rinsing, drying)?: A Lot Help from another person to put on and taking off regular upper body clothing?: A Lot Help from another person to put on and taking off regular lower body clothing?: A Lot 6 Click Score: 15    End of Session Equipment Utilized During Treatment: Gait belt;Rolling walker (2 wheels)  OT Visit Diagnosis: Unsteadiness on feet (R26.81);Other abnormalities of gait and mobility (R26.89);History of falling (Z91.81);Muscle weakness (generalized) (M62.81)   Activity Tolerance Patient limited by pain   Patient Left in chair;with call bell/phone within reach;with chair alarm set   Nurse Communication Mobility status        Time: 4098-1191 OT Time Calculation (min): 28 min  Charges: OT General Charges $OT Visit: 1 Visit OT Treatments $Self Care/Home Management : 8-22 mins $Therapeutic Activity: 8-22 mins  07/27/2023  AB, OTR/L  Acute Rehabilitation Services  Office: [phone]   Tristan Schroeder 07/27/2023, 4:15 PM

## 2023-07-27 NOTE — Plan of Care (Signed)
  Problem: Acute Rehab OT Goals (only OT should resolve) Goal: Pt. Will Perform Grooming Outcome: Completed/Met Goal: Pt. Will Transfer To Toilet Outcome: Completed/Met

## 2023-07-27 NOTE — Assessment & Plan Note (Signed)
 Poor balance in the setting of reduced neck ROM, PT/OT recommended CIR. Patient was denied CIR and appeal was also denied. Family is going to appeal again.  - Awaiting second appeal as original auth & first appeal was denied.

## 2023-07-27 NOTE — Progress Notes (Signed)
Daily Progress Note Intern Pager: [phone]  Patient name: Kent Flowers [facility] Medical record number: [id number] Date of birth: 1946/12/14 Age: 77 y.o. 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 Patient is stable for discharge but awaiting CIR appeal decision.  [facility]     * (Principal) Recurrent falls     Poor balance in the setting of reduced neck ROM, PT/OT recommended CIR.  Patient was denied CIR and appeal was also denied. Family is going to  appeal again.  - Awaiting second appeal as original auth & first appeal was denied.         Gross hematuria     Urology placed 16 F catheter on 7/28. Urine culture negative. Void  trial 8/1 with appropriate urine output and post void residuals. Patient  continues to have gross hematuria. Urology has signed off.  - continue increased dose of flomax, 0.8mg  daily  - Outpatient fu with [facility] for likely cystoscopy & evaluation       Chronic and stable conditions: Hx of Alcohol Use: No signs of alcohol withdrawal during this admission. Will continue supplements Alzheimer dementia: Delirium precautions Hx of falls: poor balance in the setting of reduced neck ROM, PT/OT recommended CIR. Awaiting appeal as original [name] was denied.  COPD: admitted for COPD exacerbation, likely 2/2 to CAP. Patient completed therapy and appears at baseline. Continue home trelegy and albuterol prn.    FEN/GI: Regular PPx: Lovenox Dispo:Stable for d/c. Barriers include CIR approval.   Subjective:  Patient reports he is doing "terrible"  this AM. He doesn't know why but he says the "morning is going all wrong." He has no specific complaints at this time.   Objective: Temp:  [97.8 F (36.6 C)-98.3 F (36.8 C)] 98 F (36.7 C) (08/09 0749) Pulse Rate:  [47-71] 71 (08/09 0749) Resp:  [16] 16 (08/09 0749) BP: (128-144)/(65-74) 144/71 (08/09 0749) SpO2:  [94 %-100 %] 97 % (08/09 0839) Physical Exam: General: age appropriate, disheveled, NAD  Cardiovascular: RRR Respiratory: slightly course breath sounds bilaterally, chronic cough Abdomen: distended, nontender Extremities: scaling of R lower leg, no LLE   Laboratory: Most recent CBC Lab Results  Component Value Date   WBC 11.6 (H) 07/25/2023   HGB 11.3 (L) 07/25/2023   HCT 36.4 (L) 07/25/2023   MCV 94.3 07/25/2023   PLT 366 07/25/2023   Most recent BMP    Latest Ref Rng & Units 07/21/2023   12:32 AM  BMP  Glucose 70 - 99 mg/dL 846   BUN 8 - 23 mg/dL 31   Creatinine 9.62 - 1.24 mg/dL 9.52   Sodium 841 - 324 mmol/L 135   Potassium 3.5 - 5.1 mmol/L 3.6   Chloride 98 - 111 mmol/L 101   CO2 22 - 32 mmol/L 27   Calcium 8.9 - 10.3 mg/dL 8.0     Imaging/Diagnostic Tests: No new imaging  Penne Lash, MD 07/27/2023, 9:28 AM  PGY-1, [facility] FPTS Intern pager: [phone], text pages welcome Secure chat group [facility] Teaching Service

## 2023-07-27 NOTE — Assessment & Plan Note (Signed)
 Urology placed 16 F catheter on 7/28. Urine culture negative. Void trial 8/1 with appropriate urine output and post void residuals. Patient continues to have gross hematuria. Urology has signed off.  - continue increased dose of flomax, 0.8mg  daily  - Outpatient fu with [facility] for likely cystoscopy & evaluation

## 2023-07-27 NOTE — TOC Progression Note (Signed)
Transition of Care [facility]) - Progression Note    Patient Details  Name: Kent Flowers MRN: [id number] Date of Birth: 1946-06-23  Transition of Care [facility] CM/SW Contact  Carley Hammed, LCSW Phone Number: 07/27/2023, 12:41 PM  Clinical Narrative:     CSW spoke with family who asked if pt had gotten any new bed offers. CSW advised that the same bed offers are available. Family states they would like to wait for the appeal prior to moving pt. If pt is denied from CIR family would like to pursue [place]. CSW followed up with Ali Chukson on bed availability. Pt will need an authorization for SNF if appeal is denied. TOC will continue to follow.   Expected Discharge Plan: IP Rehab Facility Barriers to Discharge: Insurance Authorization  Expected Discharge Plan and Services       Living arrangements for the past 2 months: Independent Living Facility, Single Family Home                                       Social Determinants of Health (SDOH) Interventions SDOH Screenings   Food Insecurity: No Food Insecurity (07/22/2023)  Housing: Low Risk  (07/22/2023)  Transportation Needs: No Transportation Needs (07/22/2023)  Utilities: Not At Risk (07/22/2023)  Alcohol Screen: Low Risk  (03/02/2020)  Depression (PHQ2-9): Low Risk  (03/16/2021)  Financial Resource Strain: Low Risk  (03/02/2020)  Physical Activity: Inactive (03/02/2020)  Social Connections: Unknown (05/02/2022)   Received from [facility]  Stress: Stress Concern Present (03/02/2020)  Tobacco Use: Medium Risk (07/13/2023)    Readmission Risk Interventions     No data to display

## 2023-07-27 NOTE — Plan of Care (Signed)

## 2023-07-27 NOTE — Plan of Care (Signed)
  Problem: Education: Goal: Knowledge of General Education information will improve Description: Including pain rating scale, medication(s)/side effects and non-pharmacologic comfort measures Outcome: Progressing   Problem: Clinical Measurements: Goal: Ability to maintain clinical measurements within normal limits will improve Outcome: Progressing   

## 2023-07-27 NOTE — Progress Notes (Signed)
Physical Therapy Treatment Patient Details Name: Kent Flowers MRN: [id number] DOB: 1946/10/10 Today's Date: 07/27/2023   History of Present Illness 77 y.o. male admitted on 07/13/23 for falls, acute hpoxemia, sepsis, and elevated troponin.  Pt dx with UTI (with hematuria) and possible PNA.  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  Pt with significant PMH of falls, COPD, thoracic laminectomy, L shoulder RTC repair and decompression (2004).    PT Comments  Patient resting in recliner at start of session and caregiver present, pt agreeable to mobilize with therapy. Patient dependently rolled to breezeway for time and energy conservation and completed sit<>stand from recliner with min cues for technique and min assist fading to min guard on 2nd/3rd stand. Pt ambulated 2 bouts ~30' and ~40' with RW and min assist to manage walker. Pt has tendency to veer Lt with gait and flex as he fatigues, cues for posture improved pt's step length. EOS pt remained in recliner Alarm on and call bell within reach and caregiver present. Will continue to progress as able.   If plan is discharge home, recommend the following: Assistance with cooking/housework;Assist for transportation;Help with stairs or ramp for entrance;A lot of help with walking and/or transfers;Supervision due to cognitive status;A lot of help with bathing/dressing/bathroom   Can travel by private vehicle     Yes  Equipment Recommendations  None recommended by PT    Recommendations for Other Services Rehab consult     Precautions / Restrictions Precautions Precautions: Fall;Other (comment) Precaution Comments: monitor O2 sats, ambulatory O2 may be needed Restrictions Weight Bearing Restrictions: No     Mobility  Bed Mobility               General bed mobility comments: pt OOB in recliner    Transfers Overall transfer level: Needs  assistance   Transfers: Sit to/from Stand, Bed to chair/wheelchair/BSC Sit to Stand: Min assist           General transfer comment: Min assist for initial stand from recliner and CGA for second stand with pt sequencing/prepping position to stand but flexing LE's and setting hands on armrests to power up.    Ambulation/Gait Ambulation/Gait assistance: Min assist, +2 safety/equipment Gait Distance (Feet): 40 Feet (30, 40) Assistive device: Rolling walker (2 wheels) Gait Pattern/deviations: Step-through pattern, Decreased step length - right, Decreased stride length, Shuffle, Decreased dorsiflexion - right, Decreased dorsiflexion - left, Trunk flexed Gait velocity: decr     General Gait Details: in assist to manage walker and steady gait as pt tends to drift/veer Lt while ambulating. Pt required cues for posture to look ahead and scan environement. ambulated in long hall of breezeway for change of scenery. pt requesting seated rest when fatigued.   Stairs             Wheelchair Mobility     Tilt Bed    Modified Rankin (Stroke Patients Only)       Balance Overall balance assessment: Needs assistance Sitting-balance support: Feet supported, Bilateral upper extremity supported Sitting balance-Leahy Scale: Fair Sitting balance - Comments: Seated balance activity to facilitate anterior weight shifting. (see exercises)   Standing balance support: During functional activity, Reliant on assistive device for balance, Bilateral upper extremity supported Standing balance-Leahy Scale: Poor  Cognition Arousal: Alert Behavior During Therapy: WFL for tasks assessed/performed Overall Cognitive Status: History of cognitive impairments - at baseline                                 General Comments: difficulty with word finding. slowed responses but able to follow simple commands. pt's caregiver present and pt enjoys having her  participate in session.        Exercises      General Comments        Pertinent Vitals/Pain Pain Assessment Pain Assessment: No/denies pain Pain Intervention(s): Limited activity within patient's tolerance, Monitored during session, Repositioned    Home Living                          Prior Function            PT Goals (current goals can now be found in the care plan section) Acute Rehab PT Goals Patient Stated Goal: to go to rehab and get stronger PT Goal Formulation: With patient/family Time For Goal Achievement: 07/28/23 Potential to Achieve Goals: Good Progress towards PT goals: Progressing toward goals    Frequency    Min 1X/week      PT Plan Current plan remains appropriate    Co-evaluation              AM-PAC PT "6 Clicks" Mobility   Outcome Measure  Help needed turning from your back to your side while in a flat bed without using bedrails?: A Little Help needed moving from lying on your back to sitting on the side of a flat bed without using bedrails?: A Little Help needed moving to and from a bed to a chair (including a wheelchair)?: A Little Help needed standing up from a chair using your arms (e.g., wheelchair or bedside chair)?: A Little Help needed to walk in hospital room?: A Little Help needed climbing 3-5 steps with a railing? : Total 6 Click Score: 16    End of Session Equipment Utilized During Treatment: Gait belt Activity Tolerance: Patient tolerated treatment well Patient left: in chair;with call bell/phone within reach;with chair alarm set Nurse Communication: Mobility status PT Visit Diagnosis: Muscle weakness (generalized) (M62.81);Difficulty in walking, not elsewhere classified (R26.2);Pain     Time: 6045-4098 PT Time Calculation (min) (ACUTE ONLY): 25 min  Charges:    $Gait Training: 8-22 mins $Therapeutic Activity: 8-22 mins PT General Charges $$ ACUTE PT VISIT: 1 Visit                     Wynn Maudlin, DPT Acute Rehabilitation Services Office [phone]  07/27/23 12:23 PM

## 2023-07-27 NOTE — Care Management Important Message (Signed)
Important Message  Patient Details  Name: Kent Flowers MRN: [id number] Date of Birth: 02-Aug-1946   Medicare Important Message Given:  Yes     Sherilyn Banker 07/27/2023, 3:05 PM

## 2023-07-28 DIAGNOSIS — R296 Repeated falls: Secondary | ICD-10-CM | POA: Diagnosis not present

## 2023-07-28 NOTE — Assessment & Plan Note (Signed)
 Poor balance in the setting of reduced neck ROM, PT/OT recommended CIR. Patient was denied CIR and appeal was also denied. Family is going to appeal again.  - Awaiting second appeal as original auth & first appeal was denied.

## 2023-07-28 NOTE — Progress Notes (Signed)
  Progress Note   Date: 07/27/2023  Patient Name: Kent Flowers        MRN#: [id number]   Clarification of diagnosis:   Bacterial pneumonia due to unknown organism

## 2023-07-28 NOTE — Progress Notes (Signed)
     Daily Progress Note Intern Pager: [phone]  Patient name: Kent Flowers [facility] Medical record number: [id number] Date of birth: 1946-05-09 Age: 77 y.o. 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 Patient is stable for discharge but awaiting CIR appeal decision.  [facility]     * (Principal) Recurrent falls     Poor balance in the setting of reduced neck ROM, PT/OT recommended CIR.  Patient was denied CIR and appeal was also denied. Family is going to  appeal again.  - Awaiting second appeal as original auth & first appeal was denied.      Chronic and stable conditions: Gross Hematuria: Patient with history of gross hematuria, followed by urology. Patient was catheterized from 7/28-/81 until he displayed appropriate voiding. Patient to follow up with urology o/p for cystoscopy and further work up   Hx of Alcohol Use: No signs of alcohol withdrawal during this admission. Will continue supplements Alzheimer dementia: Delirium precautions Hx of falls: poor balance in the setting of reduced neck ROM, PT/OT recommended CIR. Awaiting appeal as original [name] was denied.  COPD: admitted for COPD exacerbation, likely 2/2 to CAP. Patient completed therapy and appears at baseline. Continue home trelegy and albuterol prn.   FEN/GI: Regular PPx: Lovenox Dispo:Stable for d/c. Barriers include CIR appeal vs SNF.   Subjective:  Patient reports he is doing well this AM. He is asking if a bed has opened up for him for CIR. Otherwise, he has no other complaints.   Objective: Temp:   [97.6 F (36.4 C)-98.6 F (37 C)] 97.6 F (36.4 C) (08/10 0732) Pulse Rate:  [52-70] 70 (08/10 0832) Resp:  [16-18] 18 (08/10 0832) BP: (134-150)/(68-84) 138/68 (08/10 0732) SpO2:  [92 %-97 %] 92 % (08/10 0832) Weight:  [84.6 kg] 84.6 kg (08/10 0459) Physical Exam: General: age appropriate, NAD Cardiovascular: RRR, no m/r/g Respiratory: Slightly course breath sounds, improved from yesterday  Abdomen: slightly distended, nontender, normoactive bowel sounds  Extremities: no lower extremity edema, scaling of R leg improved   Laboratory: Most recent CBC Lab Results  Component Value Date   WBC 11.6 (H) 07/25/2023   HGB 11.3 (L) 07/25/2023   HCT 36.4 (L) 07/25/2023   MCV 94.3 07/25/2023   PLT 366 07/25/2023   Most recent BMP    Latest Ref Rng & Units 07/21/2023   12:32 AM  BMP  Glucose 70 - 99 mg/dL 413   BUN 8 - 23 mg/dL 31   Creatinine 2.44 - 1.24 mg/dL 0.10   Sodium 272 - 536 mmol/L 135   Potassium 3.5 - 5.1 mmol/L 3.6   Chloride 98 - 111 mmol/L 101   CO2 22 - 32 mmol/L 27   Calcium 8.9 - 10.3 mg/dL 8.0      Imaging/Diagnostic Tests: No new imaging  Penne Lash, MD 07/28/2023, 9:09 AM  PGY-1, [place] [facility] FPTS Intern pager: [phone], text pages welcome Secure chat group [facility] Teaching Service

## 2023-07-29 DIAGNOSIS — R296 Repeated falls: Secondary | ICD-10-CM | POA: Diagnosis not present

## 2023-07-29 NOTE — Progress Notes (Signed)
     Daily Progress Note Intern Pager: [phone]  Patient name: Kent Flowers [facility] Medical record number: [id number] Date of birth: 1946/06/23 Age: 77 y.o. 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 Patient is stable for discharge but awaiting CIR appeal decision.   Patient is medically stable for discharge pending CIR decision.  [facility]     * (Principal) Recurrent falls     Poor balance in the setting of reduced neck ROM, PT/OT recommended CIR.  Patient was denied CIR and appeal was also denied. Family is going to  appeal again.  - Awaiting second appeal as original auth & first appeal was denied.      Chronic and stable conditions: Gross Hematuria: Now resolved. Continue Flomax. Patient to follow up with urology o/p for cystoscopy and further work up.  Alzheimer dementia: Delirium precautions, Aricept, Seroquel COPD: Continue home trelegy and albuterol prn. 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 Barriers include CIR appeal vs SNF.   Subjective:  NAEO. Reports some continue blood in urine, no pain with urination. No concerns overnight.   Objective: Temp:  [97.6 F (36.4 C)-98.2 F (36.8 C)] 97.9 F (36.6 C) (08/10 2002) Pulse Rate:  [52-77] 62 (08/10 2002) Resp:  [16-18] 18 (08/10 2002) BP: (126-173)/(62-103) 126/62 (08/10 2002) SpO2:  [92 %-97 %] 96 % (08/10 2002) Weight:  [84.6 kg] 84.6 kg (08/10 0459) Physical Exam: General: NAD, chronically ill appearing Cardiovascular: RRR, no murmurs, trace peripheral  edema Respiratory: normal WOB on RA, CTAB, no wheezes, ronchi or rales Abdomen: soft, NTTP, no rebound or guarding Extremities: Moving all 4 extremities equally   Laboratory: Most recent CBC Lab Results  Component Value Date   WBC 11.6 (H) 07/25/2023   HGB 11.3 (L) 07/25/2023   HCT 36.4 (L) 07/25/2023   MCV 94.3 07/25/2023   PLT 366 07/25/2023   Most recent BMP    Latest Ref Rng & Units 07/21/2023   12:32 AM  BMP  Glucose 70 - 99 mg/dL 102   BUN 8 - 23 mg/dL 31   Creatinine 7.25 - 1.24 mg/dL 3.66   Sodium 440 - 347 mmol/L 135   Potassium 3.5 - 5.1 mmol/L 3.6   Chloride 98 - 111 mmol/L 101   CO2 22 - 32 mmol/L 27   Calcium 8.9 - 10.3 mg/dL 8.0      Imaging/Diagnostic Tests: No new labs or imaging.  Celine Mans, MD 07/29/2023, 1:47 AM  PGY-2, [facility] FPTS Intern pager: [phone], text pages welcome Secure chat group [facility] Teaching Service

## 2023-07-29 NOTE — Plan of Care (Signed)

## 2023-07-29 NOTE — Plan of Care (Signed)

## 2023-07-30 DIAGNOSIS — R296 Repeated falls: Secondary | ICD-10-CM | POA: Diagnosis not present

## 2023-07-30 LAB — GLUCOSE, CAPILLARY: Glucose-Capillary: 87 mg/dL (ref 70–99)

## 2023-07-30 NOTE — Progress Notes (Signed)
Inpatient Rehab Admissions Coordinator:   No contact from [employer] regarding next level appeal.  I would expect they will contact pt directly at the number associated with his [facility] Medicare policy.    Estill Dooms, PT, DPT Admissions Coordinator [phone] 07/30/23  11:40 AM

## 2023-07-30 NOTE — Plan of Care (Signed)

## 2023-07-30 NOTE — TOC Progression Note (Addendum)
Transition of Care [facility]) - Progression Note    Patient Details  Name: Kent Flowers MRN: [id number] Date of Birth: June 19, 1946  Transition [facility] Care [facility]) CM/SW Contact  Carley Hammed, LCSW Phone Number: 07/30/2023, 1:07 PM  Clinical Narrative:    CSW left VM for family to determine if they had heard back about the status of their appeal and to discuss next steps. TOC will continue to follow for DC needs.   3:30 CSW talked to pt's family extensively. They received a request for more information to be sent to the insurance company. They stated that the insurance notified them the documentation did not explain why he would need to be under physician care for rehab. Family is currently very concerned about his respiratory condition and his care while he is here on the unit. CSW updated Attending, Resident, and CIR rep to concerns.   Expected Discharge Plan: IP Rehab Facility Barriers to Discharge: Insurance Authorization  Expected Discharge Plan and Services       Living arrangements for the past 2 months: Independent Living Facility, Single Family Home                                       Social Determinants of Health (SDOH) Interventions SDOH Screenings   Food Insecurity: No Food Insecurity (07/22/2023)  Housing: Low Risk  (07/22/2023)  Transportation Needs: No Transportation Needs (07/22/2023)  Utilities: Not At Risk (07/22/2023)  Alcohol Screen: Low Risk  (03/02/2020)  Depression (PHQ2-9): Low Risk  (03/16/2021)  Financial Resource Strain: Low Risk  (03/02/2020)  Physical Activity: Inactive (03/02/2020)  Social Connections: Unknown (05/02/2022)   Received [facility]  Stress: Stress Concern Present (03/02/2020)  Tobacco Use: Medium Risk (07/13/2023)    Readmission Risk Interventions     No data to display

## 2023-07-30 NOTE — Progress Notes (Signed)
     Daily Progress Note Intern Pager: [phone]  Patient name: Kent Flowers [facility] Medical record number: [id number] Date of birth: [birth year] Age: 77 y.o. 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 Patient is stable for discharge but awaiting CIR appeal decision.   [facility]     * (Principal) Recurrent falls     Poor balance in the setting of reduced neck ROM, PT/OT recommended CIR.  Patient was denied CIR and appeal was also denied. - Awaiting second appeal as original auth & first appeal was denied     Chronic and stable conditions: Gross Hematuria: Now resolved. Continue Flomax. Patient to follow up with urology o/p for cystoscopy and further work up.  Alzheimer dementia: Delirium precautions, Aricept, Seroquel COPD: Continue home trelegy and albuterol prn. 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 Pending CIR appeal decision.   Subjective:  Patient reports that he is doing well this morning.  He states that he is ready to get up and sit in his chair  Objective: Temp:  [97.7 F (36.5 C)-98.5 F (36.9 C)] 98 F (36.7 C) (08/12 0832) Pulse Rate:  [60-71] 71 (08/12 0832) Resp:  [16-20] 16 (08/12 0832) BP: (124-142)/(69-110) 142/110 (08/12 0832) SpO2:  [94 %-99 %] 99 % (08/12 0858) Physical Exam: General: Age-appropriate male, lying comfortably in bed, NAD Cardiovascular: Regular rate and rhythm no M/R/G Respiratory: Coarse breath sounds bilaterally, unchanged from prior, normal work of breathing on  room air Abdomen: Slightly distended, nontender, soft Extremities: chronic decreased RM on right upper and lower extremity, R lower leg slightly edematous, unchanged from prior, chronic skin changes unchanged from prior    Laboratory: Most recent CBC Lab Results  Component Value Date   WBC 11.6 (H) 07/25/2023   HGB 11.3 (L) 07/25/2023   HCT 36.4 (L) 07/25/2023   MCV 94.3 07/25/2023   PLT 366 07/25/2023   Most recent BMP    Latest Ref Rng & Units 07/21/2023   12:32 AM  BMP  Glucose 70 - 99 mg/dL 630   BUN 8 - 23 mg/dL 31   Creatinine 1.60 - 1.24 mg/dL 1.09   Sodium 323 - 557 mmol/L 135   Potassium 3.5 - 5.1 mmol/L 3.6   Chloride 98 - 111 mmol/L 101   CO2 22 - 32 mmol/L 27   Calcium 8.9 - 10.3 mg/dL 8.0       Imaging/Diagnostic Tests: No new images  Georg Ruddle , MD 07/30/2023, 12:32 PM  PGY-1, [place] [facility] FPTS Intern pager: [phone], text pages welcome Secure chat group [facility] Teaching Service

## 2023-07-30 NOTE — Progress Notes (Signed)
Physical Therapy Treatment Patient Details Name: Kent Flowers MRN: [id number] DOB: [birth year] Today's Date: 07/30/2023   History of Present Illness 77 y.o. male admitted on 07/13/23 for falls, acute hpoxemia, sepsis, and elevated troponin.  Pt dx with UTI (with hematuria) and possible PNA.  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  Pt with significant PMH of falls, COPD, thoracic laminectomy, L shoulder RTC repair and decompression (2004).    PT Comments  Pt sitting up in recliner on entry, reporting neck pain and asks when pain medication due. When determined that it would be 25 min until pt could receive pain medication, pt agreeable to short walk. Pt is light min A for steadying in coming to standing and min A for short bout of ambulation due to need to urinate. D/c plan remains appropriate at this time. PT will continue to follow acutely.    If plan is discharge home, recommend the following: Assistance with cooking/housework;Assist for transportation;Help with stairs or ramp for entrance;A lot of help with walking and/or transfers;Supervision due to cognitive status;A lot of help with bathing/dressing/bathroom   Can travel by private vehicle     Yes  Equipment Recommendations  None recommended by PT    Recommendations for Other Services Rehab consult     Precautions / Restrictions Precautions Precautions: Fall;Other (comment) Precaution Comments: monitor O2 sats, ambulatory O2 may be needed Restrictions Weight Bearing Restrictions: No     Mobility  Bed Mobility               General bed mobility comments: pt OOB in recliner    Transfers Overall transfer level: Needs assistance   Transfers: Sit to/from Stand, Bed to chair/wheelchair/BSC Sit to Stand: Min assist           General transfer comment: Min assist for initial stand from recliner pt with good  sequencing/prepping position to stand by  flexing LE's and setting hands on armrests to power up.    Ambulation/Gait Ambulation/Gait assistance: Min assist Gait Distance (Feet): 20 Feet Assistive device: Rolling walker (2 wheels) Gait Pattern/deviations: Step-through pattern, Decreased step length - right, Decreased stride length, Shuffle, Decreased dorsiflexion - right, Decreased dorsiflexion - left, Trunk flexed Gait velocity: decr Gait velocity interpretation: <1.31 ft/sec, indicative of household ambulator   General Gait Details: in assist to manage walker and steady gait as pt tends to drift/veer Lt while ambulating. due to decreased ability to use RUE to assist in steering RW. With reaching door pt reports he needs to urinate, is able to turn around and start walking towards bathroom but needs standing rest break for breath recovery. Pt declines trying to walk into bathroom and request to sit in recliner and use urinal. After urination pt declines further ambulation due to neck pain and chest irritation, RN notified of request for lotion to relieve discomfort         Balance Overall balance assessment: Needs assistance Sitting-balance support: Feet supported, Bilateral upper extremity supported Sitting balance-Leahy Scale: Fair Sitting balance - Comments: Seated balance activity to facilitate anterior weight shifting. (see exercises)   Standing balance support: During functional activity, Reliant on assistive device for balance, Bilateral upper extremity supported Standing balance-Leahy Scale: Poor Standing balance comment: dependent on UE                            Cognition Arousal: Alert Behavior During  Therapy: WFL for tasks assessed/performed Overall Cognitive Status: History of cognitive impairments - at baseline                                 General Comments: difficulty with word finding. slowed responses but able to follow simple commands.  session.           General Comments General comments (skin integrity, edema, etc.): VSS on RA, requires 1x standing rest break for breath recoverty      Pertinent Vitals/Pain Pain Assessment Pain Assessment: Faces Faces Pain Scale: Hurts little more Pain Location: neck Pain Descriptors / Indicators: Aching, Grimacing, Guarding, Discomfort Pain Intervention(s): Limited activity within patient's tolerance, Monitored during session, Repositioned     PT Goals (current goals can now be found in the care plan section) Acute Rehab PT Goals Patient Stated Goal: to go to rehab and get stronger PT Goal Formulation: With patient/family Time For Goal Achievement: 07/28/23 Potential to Achieve Goals: Good Progress towards PT goals: Progressing toward goals    Frequency    Min 1X/week      PT Plan Current plan remains appropriate       AM-PAC PT "6 Clicks" Mobility   Outcome Measure  Help needed turning from your back to your side while in a flat bed without using bedrails?: A Little Help needed moving from lying on your back to sitting on the side of a flat bed without using bedrails?: A Little Help needed moving to and from a bed to a chair (including a wheelchair)?: A Little Help needed standing up from a chair using your arms (e.g., wheelchair or bedside chair)?: A Little Help needed to walk in hospital room?: A Little Help needed climbing 3-5 steps with a railing? : Total 6 Click Score: 16    End of Session Equipment Utilized During Treatment: Gait belt Activity Tolerance: Patient tolerated treatment well Patient left: in chair;with call bell/phone within reach;with chair alarm set Nurse Communication: Mobility status PT Visit Diagnosis: Muscle weakness (generalized) (M62.81);Difficulty in walking, not elsewhere classified (R26.2);Pain Pain - part of body:  (neck)     Time: 7628-3151 PT Time Calculation (min) (ACUTE ONLY): 25 min  Charges:    $Gait Training:  8-22 mins $Therapeutic Activity: 8-22 mins PT General Charges $$ ACUTE PT VISIT: 1 Visit                      B. Beverely Risen PT, DPT Acute Rehabilitation Services Please use secure chat or  Call Office [phone]    Elon Alas [facility] 07/30/2023, 5:01 PM

## 2023-07-30 NOTE — Assessment & Plan Note (Signed)
Poor balance in the setting of reduced neck ROM, PT/OT recommended CIR. Patient was denied CIR and appeal was also denied. - Awaiting second appeal as original auth & first appeal was denied

## 2023-07-31 DIAGNOSIS — R296 Repeated falls: Secondary | ICD-10-CM | POA: Diagnosis not present

## 2023-07-31 NOTE — Plan of Care (Signed)
Patient Kent Flowers. Able to use call bell appropriately. Pain well managed with current regimen. Up to chair this shift. Tolerating diet. RA. Continues with hematuria. Awaiting placement.   Problem: Education: Goal: Knowledge of General Education information will improve Description: Including pain rating scale, medication(s)/side effects and non-pharmacologic comfort measures Outcome: Progressing   Problem: Health Behavior/Discharge Planning: Goal: Ability to manage health-related needs will improve Outcome: Progressing   Problem: Clinical Measurements: Goal: Ability to maintain clinical measurements within normal limits will improve Outcome: Progressing Goal: Will remain free from infection Outcome: Progressing Goal: Diagnostic test results will improve Outcome: Progressing Goal: Respiratory complications will improve Outcome: Progressing Goal: Cardiovascular complication will be avoided Outcome: Progressing   Problem: Activity: Goal: Risk for activity intolerance will decrease Outcome: Progressing   Problem: Nutrition: Goal: Adequate nutrition will be maintained Outcome: Progressing   Problem: Coping: Goal: Level of anxiety will decrease Outcome: Progressing   Problem: Elimination: Goal: Will not experience complications related to bowel motility Outcome: Progressing Goal: Will not experience complications related to urinary retention Outcome: Progressing   Problem: Pain Managment: Goal: General experience of comfort will improve Outcome: Progressing   Problem: Safety: Goal: Ability to remain free from injury will improve Outcome: Progressing   Problem: Skin Integrity: Goal: Risk for impaired skin integrity will decrease Outcome: Progressing

## 2023-07-31 NOTE — Progress Notes (Signed)
Inpatient Rehab Admissions Coordinator:   Spoke to pt's son and daughter in law regarding insurance barrier to CIR.  I have not heard from [employer], though I do not typically hear a response from them it is relayed directly to the patient.  We discussed that I am not authorized to admit patients to CIR without insurance approval.  They would like to get administration involved, and so I passed along to my supervisor Leia Alf) who is passing along to her supervisor, Benson Setting.  Discussed with FMTS and we are planning a bedside meeting tomorrow to discuss further.

## 2023-07-31 NOTE — Progress Notes (Signed)
Called patients son Gladstone Pih and spoke with him and his wife regarding disposition for patient. Recommended a family meeting with CIR and PT/OT and TOC if possible to discuss disposition for patient especially since family is concerned about the appeal for CIR authorization. Will discuss with team and family to schedule meeting.

## 2023-07-31 NOTE — Progress Notes (Addendum)
Occupational Therapy Treatment Patient Details Name: Kent Flowers MRN: [id number] DOB: Jun 23, 1946 Today's Date: 07/31/2023   History of present illness 77 y.o. male admitted on 07/13/23 for falls, acute hpoxemia, sepsis, and elevated troponin.  Pt dx with UTI (with hematuria) and possible PNA.  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  Pt with significant PMH of falls, COPD, thoracic laminectomy, L shoulder RTC repair and decompression (2004).   OT comments  Pt in recliner upon therapy arrival and agreeable to participate in OT treatment session. Session with focus on providing patient education regarding energy conservation strategies and increasing activity tolerance and endurance during functional mobility utilizing RW. Handout provided for reference for energy conservation. Pt was unable to verbalize any particular strategy that he thinks will be helpful to him. Did not require any standing rest break when walking with OT demonstrating some improvement with endurance and activity tolerance. Continue to recommend intensive inpatient follow up therapy, >3 hours/day  prior to returning home. OT will continue to follow patient acutely.    Edit: In the afternoon, placed coban around RW right handle to increase ability to keep hand in position and allow greater control of RW during functional transfers and mobility.        If plan is discharge home, recommend the following:  A little help with walking and/or transfers;A little help with bathing/dressing/bathroom;Assistance with cooking/housework;Assist for transportation;Help with stairs or ramp for entrance   Equipment Recommendations  Other (comment) (defer to next level of care)    Recommendations for Other Services Rehab consult    Precautions / Restrictions Precautions Precautions: Fall;Other (comment) Precaution Comments: monitor O2 sats,  ambulatory O2 may be needed. HOH Restrictions Weight Bearing Restrictions: No       Mobility Bed Mobility Overal bed mobility:  (pt sitting up in recliner upon therapy arrival)       Transfers Overall transfer level: Needs assistance Equipment used: Rolling walker (2 wheels) Transfers: Sit to/from Stand, Bed to chair/wheelchair/BSC Sit to Stand: Min assist     Step pivot transfers: Min assist     General transfer comment: VC provided for hand placement during RW management. VC also provided when returning to recliner after walking in hallway for safe use of RW until sitting surface is reached. Pt completed functional mobility utilizing RW from room to hallway. Moderate difficulty maintaining right hand on RW. Pt stopped when needed to readjust right hand.     Balance Overall balance assessment: Needs assistance Sitting-balance support: Feet supported, Bilateral upper extremity supported Sitting balance-Leahy Scale: Fair Sitting balance - Comments: Sitting in recliner   Standing balance support: Single extremity supported, During functional activity, Reliant on assistive device for balance Standing balance-Leahy Scale: Fair Standing balance comment: less reliance on external support when up with therapy        ADL either performed or assessed with clinical judgement   ADL        Upper Body Dressing : Minimal assistance;Sitting Upper Body Dressing Details (indicate cue type and reason): due to RUE deficits               Cognition Arousal: Alert Behavior During Therapy: WFL for tasks assessed/performed Overall Cognitive Status: History of cognitive impairments - at baseline              General Comments: difficulty with word finding. slowed responses but able to follow simple commands. session.  Exercises Other Exercises Other Exercises: Pt educated on energy conservation strategies and compensatory breathing technique to use during daily tasks.  Handout provided for reference. Pt reports that he is not currently utilizing any strategies currently to conserve energy.       General Comments SpO2 on RA remainted WFL (96-98%)    Pertinent Vitals/ Pain       Pain Assessment Pain Assessment: Faces Faces Pain Scale: No hurt  Home Living                           Frequency  Min 1X/week        Progress Toward Goals  OT Goals(current goals can now be found in the care plan section)  Progress towards OT goals: Progressing toward goals  ADL Goals Pt Will Transfer to Toilet:  (goal met 8/13) Additional ADL Goal #1:  (goal met 8/13)  Plan Discharge plan remains appropriate;Frequency remains appropriate       AM-PAC OT "6 Clicks" Daily Activity     Outcome Measure   Help from another person eating meals?: A Little Help from another person taking care of personal grooming?: A Little Help from another person toileting, which includes using toliet, bedpan, or urinal?: A Little Help from another person bathing (including washing, rinsing, drying)?: A Lot Help from another person to put on and taking off regular upper body clothing?: A Lot Help from another person to put on and taking off regular lower body clothing?: A Lot 6 Click Score: 15    End of Session Equipment Utilized During Treatment: Gait belt;Rolling walker (2 wheels)  OT Visit Diagnosis: Unsteadiness on feet (R26.81);Other abnormalities of gait and mobility (R26.89);History of falling (Z91.81);Muscle weakness (generalized) (M62.81)   Activity Tolerance Patient tolerated treatment well   Patient Left in chair;with call bell/phone within reach;with chair alarm set   Nurse Communication          Time: 1610-9604 OT Time Calculation (min): 35 min  Charges: OT General Charges $OT Visit: 1 Visit OT Treatments $Therapeutic Activity: 23-37 mins  Limmie Patricia, OTR/L,CBIS  Supplemental OT - [facility] and [facility] Secure Chat Preferred    ,  Charisse March 07/31/2023, 11:48 AM

## 2023-07-31 NOTE — Progress Notes (Signed)
Mobility Specialist: Progress Note   07/31/23 1636  Mobility  Activity Ambulated with assistance in hallway  Level of Assistance Minimal assist, patient does 75% or more  Assistive Device Front wheel walker  Distance Ambulated (ft) 40 ft  Activity Response Tolerated well  Mobility Referral Yes  $Mobility charge 1 Mobility  Mobility Specialist Start Time (ACUTE ONLY) 1616  Mobility Specialist Stop Time (ACUTE ONLY) 1628  Mobility Specialist Time Calculation (min) (ACUTE ONLY) 12 min   During Mobility: SpO2 97% RA  Pt was agreeable to mobility session - received in chair. Required minA for STS and ambulation with verbal and tactile cues for R hand placement. Also needed multiple verbal cues for steering and proper RW use. Observed pt wheezing - SpO2 97% RA. Denied any pain, fatigue, or SOB. Returned to chair with inhaler, RN in room.   Maurene Capes Mobility Specialist Please contact via SecureChat or Rehab office at [phone]

## 2023-07-31 NOTE — Progress Notes (Signed)
     Daily Progress Note Intern Pager: [phone]  Patient name: Kent Flowers [facility] Medical record number: [id number] Date of birth: [birth year] Age: 77 y.o. Gender: male  Primary Care Provider: Earnest Rosier, MD Consultants: Urology Code Status: DNR  Pt Overview and Major Events to Date:  7/29- admitted 7/30- accepted to CIR, pending insurance auth 8/1- void trial  8/2- insurance denied CIR, pending appeal 8/7- appeal denied, family filing [facility] appeal   Assessment and Plan: Patient is a 77 year old male with past medical history of DISH of C-spine chronic pain management, COPD not on oxygen admitted initially for COPD exacerbation and gross hematuria.  Patient is stable for discharge but is awaiting CIR appeal decision. -      Hospital     * (Principal) Recurrent falls     Poor balance in the setting of reduced neck ROM, PT/OT recommended CIR.  Patient was denied CIR and appeal was also denied. - PT/OT following - Awaiting second appeal as original auth & first appeal was denied -Will schedule family meeting to be discussed disposition     Chronic and stable conditions: Gross Hematuria: Patient continues to have gross hematuria, as hemoglobin is stable urology signed off during admission.  Continue Flomax. Patient to follow up with urology o/p for cystoscopy and further work up.  Alzheimer dementia: Delirium precautions, Aricept, Seroquel COPD: Continue home trelegy and albuterol prn. 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  Pending CIR [facility] appeal decision.  Subjective:  Reports that he is doing well today.  He is wondering if he can leave and wants to know what is going on with his authorization.  Otherwise he has no complaints at this time.  Objective: Temp:  [97.8 F (36.6 C)-98.8 F (37.1 C)] 98.1 F (36.7 C) (08/13 0854) Pulse Rate:  [56-78] 62 (08/13 0854) Resp:  [16-17] 17 (08/13 0854) BP:  (101-142)/(59-91) 101/59 (08/13 0854) SpO2:  [94 %-99 %] 99 % (08/13 0854) Physical Exam: General: Age-appropriate male, sitting in chair eating breakfast, no acute distress Cardiovascular: Regular rate and rhythm no murmurs rubs or gallops Respiratory: Coarse breath sounds bilaterally, worse on right apex, unchanged from prior Abdomen: Slightly distended, nontender, normal bowel sounds Extremities: Right lower extremity with chronic scaling and shiny skin, 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  Laboratory: Most recent CBC Lab Results  Component Value Date   WBC 11.6 (H) 07/25/2023   HGB 11.3 (L) 07/25/2023   HCT 36.4 (L) 07/25/2023   MCV 94.3 07/25/2023   PLT 366 07/25/2023   Most recent BMP    Latest Ref Rng & Units 07/21/2023   12:32 AM  BMP  Glucose 70 - 99 mg/dL 147   BUN 8 - 23 mg/dL 31   Creatinine 8.29 - 1.24 mg/dL 5.62   Sodium 130 - 865 mmol/L 135   Potassium 3.5 - 5.1 mmol/L 3.6   Chloride 98 - 111 mmol/L 101   CO2 22 - 32 mmol/L 27   Calcium 8.9 - 10.3 mg/dL 8.0      Imaging/Diagnostic Tests: No new imaging Penne Lash, MD 07/31/2023, 12:49 PM  PGY-1, [facility] FPTS Intern pager: [phone], text pages welcome Secure chat group [facility] Teaching Service

## 2023-07-31 NOTE — Assessment & Plan Note (Addendum)
Poor balance in the setting of reduced neck ROM, PT/OT recommended CIR. Patient was denied CIR and appeal was also denied. - PT/OT following - Awaiting second appeal as original auth & first appeal was denied -Will schedule family meeting to be discussed disposition

## 2023-08-01 DIAGNOSIS — R296 Repeated falls: Secondary | ICD-10-CM | POA: Diagnosis not present

## 2023-08-01 NOTE — Progress Notes (Signed)
Occupational Therapy Treatment Patient Details Name: Kent Flowers MRN: [id number] DOB: [birth year] Today's Date: 08/01/2023   History of present illness 77 y.o. male admitted on 07/13/23 for falls, acute hpoxemia, sepsis, and elevated troponin.  Pt dx with UTI (with hematuria) and possible PNA.  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  Pt with significant PMH of falls, COPD, thoracic laminectomy, L shoulder RTC repair and decompression (2004).   OT comments  Pt needing min A with cues to recall proper placement of hands for STS. He also needs min A for RW management to ambulate due to loose R gross grasp, may benefit from having a personal walker splint to maintain grasp on RW with R hand. Pt continues to experience heavy labored breathing with mobility, reinforced use of pursed lip breathing to help reduce RR. Pt left in care of PT at end of session, OT attending family conference with medical team to discuss pt POC and functional status. OT to continue to work with patient and progress them as able. DC plans update to SNF following meeting with family and discussing pt current status.      If plan is discharge home, recommend the following:  A little help with walking and/or transfers;A little help with bathing/dressing/bathroom;Assistance with cooking/housework;Assist for transportation;Help with stairs or ramp for entrance   Equipment Recommendations  Other (comment) (defer to next level of care.)    Recommendations for Other Services      Precautions / Restrictions Precautions Precautions: Fall;Other (comment) Precaution Comments: monitor O2 sats, ambulatory O2 may be needed. HOH Restrictions Weight Bearing Restrictions: No       Mobility Bed Mobility               General bed mobility comments: Pt rec'd sitting in recliner. Pt left sitting on toilet in care of his providing  PT    Transfers Overall transfer level: Needs assistance Equipment used: Rolling walker (2 wheels) Transfers: Sit to/from Stand Sit to Stand: Min assist           General transfer comment: Min A from recliner with assist for RUE hand positioning on RW and cues to scoot forward with proper hand placement to assist with push off.     Balance Overall balance assessment: Needs assistance Sitting-balance support: Feet supported, Bilateral upper extremity supported Sitting balance-Leahy Scale: Fair Sitting balance - Comments: Sitting in recliner   Standing balance support: Single extremity supported, During functional activity, Reliant on assistive device for balance Standing balance-Leahy Scale: Fair             ADL either performed or assessed with clinical judgement   ADL Overall ADL's : Needs assistance/impaired               Toilet Transfer: Rolling walker (2 wheels);Ambulation;Minimal assistance Toilet Transfer Details (indicate cue type and reason): Min A to help with RW management, RUE positioned in place on RW with use of loosely fastened towel with simple escape knot to maintain grasp on RW Toileting- Clothing Manipulation and Hygiene: Contact guard assist;Sit to/from stand       Functional mobility during ADLs: Rolling walker (2 wheels);Minimal assistance General ADL Comments: Reinforced use of pursed lip breathing as patient noted to have heavy labored breathing following ambulation to bathroom but Sp02 was 91%      Cognition Arousal: Alert Behavior During Therapy: WFL for tasks assessed/performed Overall Cognitive Status: History of cognitive  impairments - at baseline                          General Comments Sp02 91% with functional activity, continues with heavy labored breahting following mobility but is resolvable with pursed lip breathing and seated rest breaks    Pertinent Vitals/ Pain       Pain Assessment Pain Assessment: No/denies  pain   Frequency  Min 1X/week        Progress Toward Goals  OT Goals(current goals can now be found in the care plan section)  Progress towards OT goals: Progressing toward goals  Acute Rehab OT Goals Patient Stated Goal: to improve balance and functional strength OT Goal Formulation: With patient Time For Goal Achievement: 08/15/23 Potential to Achieve Goals: Fair ADL Goals Pt Will Transfer to Toilet: with supervision;ambulating Additional ADL Goal #1: Pt will demonstrate independent use of energy conservation strategies to improve functional performance in ADLs  Plan Frequency remains appropriate;Discharge plan needs to be updated       AM-PAC OT "6 Clicks" Daily Activity     Outcome Measure   Help from another person eating meals?: A Little Help from another person taking care of personal grooming?: A Little Help from another person toileting, which includes using toliet, bedpan, or urinal?: A Little Help from another person bathing (including washing, rinsing, drying)?: A Lot Help from another person to put on and taking off regular upper body clothing?: A Lot Help from another person to put on and taking off regular lower body clothing?: A Lot 6 Click Score: 15    End of Session Equipment Utilized During Treatment: Gait belt;Rolling walker (2 wheels)  OT Visit Diagnosis: Unsteadiness on feet (R26.81);Other abnormalities of gait and mobility (R26.89);History of falling (Z91.81);Muscle weakness (generalized) (M62.81)   Activity Tolerance Patient tolerated treatment well   Patient Left Other (comment) (sitting on toilet in care of providing PT)   Nurse Communication Mobility status        Time: 1359-1410 OT Time Calculation (min): 11 min  Charges: OT General Charges $OT Visit: 1 Visit OT Treatments $Therapeutic Activity: 8-22 mins  08/01/2023  AB, OTR/L  Acute Rehabilitation Services  Office: [phone]   Tristan Schroeder 08/01/2023, 3:22 PM

## 2023-08-01 NOTE — Progress Notes (Addendum)
Met with family and rest of care team (including Dr. Jennette Kettle, PT/OT, CIR, TOC) to discuss placement for patient as it seems that he is been denied from CIR.  After lengthy discussion about safe disposition for patient, it was decided that family will pursue SNF placement with long term goal being an assisted living facility. TOC will work with family to find placement for patient. Patient is medically stable for discharge

## 2023-08-01 NOTE — TOC Progression Note (Signed)
Transition of Care [facility]) - Progression Note    Patient Details  Name: HAIM KILPELA MRN: [id number] Date of Birth: March 03, 1946  [facility] CM/SW Contact  Carley Hammed, LCSW Phone Number: 08/01/2023, 3:35 PM  Clinical Narrative:    CSW met with family and care team to determine next steps and goals for discharge. Family agreeable to SNF and Medicare. Gov offers provided. Family notes they will go and tour [place]. CSW to start authorization when facility is chosen. TOC will continue to follow.    Expected Discharge Plan: IP Rehab Facility Barriers to Discharge: Insurance Authorization  Expected Discharge Plan and Services       Living arrangements for the past 2 months: Independent Living Facility, Single Family Home                                       Social Determinants of Health (SDOH) Interventions SDOH Screenings   Food Insecurity: No Food Insecurity (07/22/2023)  Housing: Low Risk  (07/22/2023)  Transportation Needs: No Transportation Needs (07/22/2023)  Utilities: Not At Risk (07/22/2023)  Alcohol Screen: Low Risk  (03/02/2020)  Depression (PHQ2-9): Low Risk  (03/16/2021)  Financial Resource Strain: Low Risk  (03/02/2020)  Physical Activity: Inactive (03/02/2020)  Social Connections: Unknown (05/02/2022)   Received from [facility], [facility]  Stress: Stress Concern Present (03/02/2020)  Tobacco Use: Medium Risk (07/13/2023)    Readmission Risk Interventions     No data to display

## 2023-08-01 NOTE — Progress Notes (Signed)
Physical Therapy Treatment Patient Details Name: Kent Flowers MRN: [id number] DOB: [birth year] Today's Date: 08/01/2023   History of Present Illness 77 y.o. male admitted on 07/13/23 for falls, acute hpoxemia, sepsis, and elevated troponin.  Pt dx with UTI (with hematuria) and possible PNA.  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  Pt with significant PMH of falls, COPD, thoracic laminectomy, L shoulder RTC repair and decompression (2004).    PT Comments  Took over therapy session from OT, with pt finishing up on toilet. Pt able to utilize toilet tissue for pericare, but requires assistance for fully cleaning. Encouraged pt to ambulate in hallway with PT. Pt reports increased fatigue and request to return to recliner. Asked pt to perform sit to stands, pt request no further therapy. Discussed need for continued participation to work on increased endurance. Pt eventually agreeable to limited LE exercise. PT will continue to follow acutely to improve strength and endurance.     If plan is discharge home, recommend the following: Assistance with cooking/housework;Assist for transportation;Help with stairs or ramp for entrance;A lot of help with walking and/or transfers;Supervision due to cognitive status;A lot of help with bathing/dressing/bathroom   Can travel by private vehicle     Yes  Equipment Recommendations  None recommended by PT       Precautions / Restrictions Precautions Precautions: Fall;Other (comment) Precaution Comments: monitor O2 sats, ambulatory O2 may be needed Restrictions Weight Bearing Restrictions: No     Mobility  Bed Mobility               General bed mobility comments: Pt OOB    Transfers Overall transfer level: Needs assistance   Transfers: Sit to/from Stand, Bed to chair/wheelchair/BSC Sit to Stand: Contact guard assist           General transfer  comment: contact guard for power up from elevated BSC seat over toilet    Ambulation/Gait Ambulation/Gait assistance: Min assist Gait Distance (Feet): 12 Feet Assistive device: Rolling walker (2 wheels) Gait Pattern/deviations: Step-through pattern, Decreased step length - right, Decreased stride length, Shuffle, Decreased dorsiflexion - right, Decreased dorsiflexion - left, Trunk flexed Gait velocity: decr Gait velocity interpretation: <1.31 ft/sec, indicative of household ambulator   General Gait Details: light min A for guiding RW with return to recliner from bathroom, pt refusing further ambulation due to fatigue         Balance Overall balance assessment: Needs assistance Sitting-balance support: Feet supported, Bilateral upper extremity supported Sitting balance-Leahy Scale: Fair Sitting balance - Comments: Seated balance activity to facilitate anterior weight shifting. (see exercises)   Standing balance support: During functional activity, Reliant on assistive device for balance, Bilateral upper extremity supported Standing balance-Leahy Scale: Poor Standing balance comment: dependent on UE                            Cognition Arousal: Alert Behavior During Therapy: WFL for tasks assessed/performed Overall Cognitive Status: History of cognitive impairments - at baseline                                          Exercises General Exercises - Lower Extremity Ankle Circles/Pumps: AROM, Left, AAROM, Right, 10 reps Long Arc Quad: AROM, Left, AAROM, Right, 10 reps Hip ABduction/ADduction: AROM, Left, AAROM,  Right, 10 reps Hip Flexion/Marching: AROM, Left, AAROM, Right, 10 reps    General Comments General comments (skin integrity, edema, etc.): VSS on RA      Pertinent Vitals/Pain Pain Assessment Pain Assessment: No/denies pain     PT Goals (current goals can now be found in the care plan section) Acute Rehab PT Goals Patient Stated  Goal: to go to rehab and get stronger PT Goal Formulation: With patient/family Time For Goal Achievement: 07/28/23 Potential to Achieve Goals: Good Progress towards PT goals: Not progressing toward goals - comment    Frequency    Min 1X/week      PT Plan Current plan remains appropriate       AM-PAC PT "6 Clicks" Mobility   Outcome Measure  Help needed turning from your back to your side while in a flat bed without using bedrails?: A Little Help needed moving from lying on your back to sitting on the side of a flat bed without using bedrails?: A Little Help needed moving to and from a bed to a chair (including a wheelchair)?: A Little Help needed standing up from a chair using your arms (e.g., wheelchair or bedside chair)?: A Little Help needed to walk in hospital room?: A Little Help needed climbing 3-5 steps with a railing? : Total 6 Click Score: 16    End of Session Equipment Utilized During Treatment: Gait belt Activity Tolerance: Patient tolerated treatment well Patient left: in chair;with call bell/phone within reach;with chair alarm set Nurse Communication: Mobility status PT Visit Diagnosis: Muscle weakness (generalized) (M62.81);Difficulty in walking, not elsewhere classified (R26.2);Pain     Time: 1411-1432 PT Time Calculation (min) (ACUTE ONLY): 21 min  Charges:    $Therapeutic Exercise: 8-22 mins PT General Charges $$ ACUTE PT VISIT: 1 Visit                      B. Beverely Risen PT, DPT Acute Rehabilitation Services Please use secure chat or  Call Office [phone]    Elon Alas [facility] 08/01/2023, 4:25 PM

## 2023-08-01 NOTE — Progress Notes (Signed)
     Daily Progress Note Intern Pager: [phone]  Patient name: Kent Flowers [facility] Medical record number: [id number] Date of birth: 25-Sep-1946 Age: 77 y.o. Gender: male  Primary Care Provider: Earnest Rosier, MD Consultants: Urology, signed off Code Status: DNR  Pt Overview and Major Events to Date:  7/29- admitted 7/30- accepted to CIR, pending insurance auth 8/1- void trial  8/2- insurance denied CIR, pending appeal 8/7- appeal denied, family filing maximus appeal    Assessment and Plan: Patient is a 77 year old male with past medical history of DISH of C-spine chronic pain management, COPD not on oxygen admitted initially for COPD exacerbation and gross hematuria.  Patient is stable for discharge but is awaiting CIR appeal decision.  -      Hospital     * (Principal) Recurrent falls     Poor balance in the setting of reduced neck ROM, PT/OT recommended CIR.  Patient was denied CIR and appeal was also denied. - PT/OT following - Awaiting second appeal as original auth & first appeal was denied - family meeting with PT/OT, LSW, and CIR today      Chronic and stable conditions: Gross Hematuria: Patient continues to have gross hematuria, as hemoglobin is stable urology signed off during admission.  Continue Flomax. Patient to follow up with urology o/p for cystoscopy and further work up.  Alzheimer dementia: Delirium precautions, Aricept, Seroquel COPD: Continue home trelegy and albuterol prn. 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  Pending CIR Maximus appeal decision.  Subjective:  Patient reports he is doing okay this morning. He reports that he would like to know when he is going to the fourth floor.  Objective: Temp:  [97.9 F (36.6 C)-98.6 F (37 C)] 97.9 F (36.6 C) (08/14 0715) Pulse Rate:  [43-98] 60 (08/14 0715) Resp:  [16-17] 16 (08/14 0715) BP: (120-147)/(68-84) 147/84 (08/14 0715) SpO2:  [93 %-98 %] 98  % (08/14 0840) Physical Exam: General: Age-appropriate male, comfortable no acute distress Cardiovascular: Regular rate and rhythm no murmurs rubs or gallops Respiratory: Breath sounds bilaterally, unchanged from prior Abdomen: Distended, nontender normal bowel sounds Extremities: Lower extremity with chronic scaling and shiny skin MSK: Decreased range of motion to right upper digits 3-5  Laboratory: Most recent CBC Lab Results  Component Value Date   WBC 11.6 (H) 07/25/2023   HGB 11.3 (L) 07/25/2023   HCT 36.4 (L) 07/25/2023   MCV 94.3 07/25/2023   PLT 366 07/25/2023   Most recent BMP    Latest Ref Rng & Units 07/21/2023   12:32 AM  BMP  Glucose 70 - 99 mg/dL 147   BUN 8 - 23 mg/dL 31   Creatinine 8.29 - 1.24 mg/dL 5.62   Sodium 130 - 865 mmol/L 135   Potassium 3.5 - 5.1 mmol/L 3.6   Chloride 98 - 111 mmol/L 101   CO2 22 - 32 mmol/L 27   Calcium 8.9 - 10.3 mg/dL 8.0      Imaging/Diagnostic Tests: No new imaging   Penne Lash, MD 08/01/2023, 9:44 AM  PGY-1, [place] [facility] [facility] FPTS Intern pager: [phone], text pages welcome Secure chat group [facility] Teaching Service

## 2023-08-01 NOTE — Assessment & Plan Note (Addendum)
Poor balance in the setting of reduced neck ROM, PT/OT recommended CIR. Patient was denied CIR and appeal was also denied. - PT/OT following - Awaiting second appeal as original auth & first appeal was denied - family meeting with PT/OT, LSW, and CIR today

## 2023-08-01 NOTE — Progress Notes (Signed)
Inpatient Rehab Admissions Coordinator:   Discussed with medical team, therapy team, social work, and family.  Insurance remains a barrier to CIR admission, as [facility] Medicare does not feel pt meets criteria for a hospital level program.  FMTS reviewed pt's hospital course up to this date, and agrees that pt is medically stable to discharge to the next level of care.  Therapy explained pt's progress with mobility/ADLs throughout his hospital stay.  Questions answered.  Plan to move forward with discharge to SNF for short term rehab when bed secured.  CIR will sign off at this time.  If there are any questions please feel free to contact me.   Estill Dooms, PT, DPT Admissions Coordinator [phone] 08/01/23  3:11 PM

## 2023-08-01 NOTE — Plan of Care (Signed)
  Problem: Pain Managment: Goal: General experience of comfort will improve 08/01/2023 0011 by Sammuel Cooper, RN Outcome: Progressing   Problem: Safety: Goal: Ability to remain free from injury will improve 08/01/2023 0011 by Sammuel Cooper, RN Outcome: Progressing

## 2023-08-02 DIAGNOSIS — R296 Repeated falls: Secondary | ICD-10-CM | POA: Diagnosis not present

## 2023-08-02 LAB — GLUCOSE, CAPILLARY: Glucose-Capillary: 129 mg/dL — ABNORMAL HIGH (ref 70–99)

## 2023-08-02 NOTE — Assessment & Plan Note (Signed)
Poor balance in the setting of reduced neck ROM, PT/OT recommended CIR. Patient was denied CIR and appeal was also denied.  Family meeting yesterday to discuss placement, decided for SNF. - PT/OT following - Awaiting second appeal as original auth & first appeal was denied - family to tour SNFs and decide this week

## 2023-08-02 NOTE — Plan of Care (Signed)
  Problem: Pain Managment: Goal: General experience of comfort will improve Outcome: Progressing   Problem: Safety: Goal: Ability to remain free from injury will improve Outcome: Progressing   

## 2023-08-02 NOTE — Progress Notes (Signed)
     Daily Progress Note Intern Pager: [phone]  Patient name: Kent Flowers [facility] Medical record number: [id number] Date of birth: 10-Jun-1946 Age: 77 y.o. Gender: male  Primary Care Provider: Earnest Rosier, MD Consultants: Urology, signed off Code Status: DNR   Pt Overview and Major Events to Date:  7/29- admitted 7/30- accepted to CIR, pending insurance auth 8/1- void trial  8/2- insurance denied CIR, pending appeal 8/7- appeal denied, family filing maximus appeal    Assessment and Plan: Patient is a 77 year old male with past medical history of DISH of C-spine chronic pain management, COPD not on oxygen admitted initially for COPD exacerbation and gross hematuria.  Patient is stable for discharge but is awaiting CIR appeal decision. -      Hospital     * (Principal) Recurrent falls     Poor balance in the setting of reduced neck ROM, PT/OT recommended CIR.  Patient was denied CIR and appeal was also denied.  Family meeting  yesterday to discuss placement, decided for SNF. - PT/OT following - Awaiting second appeal as original auth & first appeal was denied - family to tour SNFs and decide this week       Chronic and Stable Problems:  Gross Hematuria: Patient continues to have gross hematuria, as hemoglobin is stable urology signed off during admission.  Continue Flomax. Patient to follow up with urology o/p for cystoscopy and further work up.  Alzheimer dementia: Delirium precautions, Aricept, Seroquel COPD: Continue home trelegy and albuterol prn. 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 Barriers include placement.   Subjective:  Patient reports that he is doing well this morning.  He reports that he has no complaints at this time.  He states that he is okay going to a SNF and was wondering when he is going to be discharged from the hospital.  Objective: Temp:  [97.6 F (36.4 C)-98.4 F (36.9 C)] 97.8 F (36.6 C)  (08/15 0818) Pulse Rate:  [59-70] 59 (08/15 0818) Resp:  [16-18] 17 (08/15 0818) BP: (119-134)/(62-68) 134/63 (08/15 0818) SpO2:  [93 %-95 %] 94 % (08/15 0818) Physical Exam: General: sitting in chair eating breakfast, NAD Cardiovascular: RRR Respiratory: course breath sounds, slightly increased on R side, unchanged from prior  Abdomen: distended, soft, normal bowel sounds Extremities: decreased ROM to R hand, chronically   Laboratory: Most recent CBC Lab Results  Component Value Date   WBC 11.6 (H) 07/25/2023   HGB 11.3 (L) 07/25/2023   HCT 36.4 (L) 07/25/2023   MCV 94.3 07/25/2023   PLT 366 07/25/2023   Most recent BMP    Latest Ref Rng & Units 07/21/2023   12:32 AM  BMP  Glucose 70 - 99 mg/dL 130   BUN 8 - 23 mg/dL 31   Creatinine 8.65 - 1.24 mg/dL 7.84   Sodium 696 - 295 mmol/L 135   Potassium 3.5 - 5.1 mmol/L 3.6   Chloride 98 - 111 mmol/L 101   CO2 22 - 32 mmol/L 27   Calcium 8.9 - 10.3 mg/dL 8.0     Imaging/Diagnostic Tests: No new imaging  Penne Lash, MD 08/02/2023, 12:03 PM  PGY-1, [place] [facility] FPTS Intern pager: [phone], text pages welcome Secure chat group [facility] Teaching Service

## 2023-08-03 ENCOUNTER — Encounter (HOSPITAL_COMMUNITY): Payer: Self-pay | Admitting: Student

## 2023-08-03 ENCOUNTER — Encounter: Payer: Self-pay | Admitting: Internal Medicine

## 2023-08-03 DIAGNOSIS — R6339 Other feeding difficulties: Secondary | ICD-10-CM | POA: Diagnosis not present

## 2023-08-03 DIAGNOSIS — G8320 Monoplegia of upper limb affecting unspecified side: Secondary | ICD-10-CM

## 2023-08-03 DIAGNOSIS — R296 Repeated falls: Secondary | ICD-10-CM | POA: Diagnosis not present

## 2023-08-03 HISTORY — DX: Monoplegia of upper limb affecting unspecified side: G83.20

## 2023-08-03 HISTORY — DX: Other feeding difficulties: R63.39

## 2023-08-03 LAB — QUANTIFERON-TB GOLD PLUS (RQFGPL)
QuantiFERON Mitogen Value: >10 [IU]/mL
QuantiFERON Nil Value: 0.05 [IU]/mL
QuantiFERON TB1 Ag Value: 0.03 [IU]/mL
QuantiFERON TB2 Ag Value: 0.02 [IU]/mL

## 2023-08-03 LAB — GLUCOSE, CAPILLARY
Glucose-Capillary: 94 mg/dL (ref 70–99)
Glucose-Capillary: 114 mg/dL — ABNORMAL HIGH (ref 70–99)
Glucose-Capillary: 107 mg/dL — ABNORMAL HIGH (ref 70–99)

## 2023-08-03 LAB — QUANTIFERON-TB GOLD PLUS: QuantiFERON-TB Gold Plus: NEGATIVE

## 2023-08-03 NOTE — Progress Notes (Signed)
Occupational Therapy Treatment Patient Details Name: Kent Flowers MRN: [id number] DOB: 11-Jul-1946 Today's Date: 08/03/2023   History of present illness 77 y.o. male admitted on 07/13/23 for falls, acute hpoxemia, sepsis, and elevated troponin.  Pt dx with UTI (with hematuria) and possible PNA.  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  Pt with significant PMH of falls, COPD, thoracic laminectomy, L shoulder RTC repair and decompression (2004).   OT comments  Pt up in chair upon arrival. Min assist to stand and ambulate to bathroom for toileting. Assisted for thoroughness with pericare and backside gown. Stood at sink with CGA to wash hands prior to return to chair for lunch. Patient will benefit from continued inpatient follow up therapy, <3 hours/day.      If plan is discharge home, recommend the following:  A little help with walking and/or transfers;A little help with bathing/dressing/bathroom;Assistance with cooking/housework;Assist for transportation;Help with stairs or ramp for entrance   Equipment Recommendations       Recommendations for Other Services      Precautions / Restrictions Precautions Precautions: Fall;Other (comment) Restrictions Weight Bearing Restrictions: No       Mobility Bed Mobility               General bed mobility comments: Pt received in chair.    Transfers Overall transfer level: Needs assistance Equipment used: Rolling walker (2 wheels) Transfers: Sit to/from Stand Sit to Stand: Contact guard assist, Min assist           General transfer comment: min from recliner, CGA from [facility]     Balance Overall balance assessment: Needs assistance   Sitting balance-Leahy Scale: Fair       Standing balance-Leahy Scale: Poor Standing balance comment: dependent on UEs for ambulation                           ADL either performed or  assessed with clinical judgement   ADL Overall ADL's : Needs assistance/impaired Eating/Feeding: Set up;Sitting   Grooming: Wash/dry hands;Contact guard assist;Standing                   Toilet Transfer: Rolling walker (2 wheels);Ambulation;Minimal assistance   Toileting- Clothing Manipulation and Hygiene: Contact guard assist;Sit to/from stand       Functional mobility during ADLs: Rolling walker (2 wheels);Minimal assistance General ADL Comments: Instructed in pacing and breathing techniques with exertion.    Extremity/Trunk Assessment              Vision       Perception     Praxis      Cognition Arousal: Alert Behavior During Therapy: WFL for tasks assessed/performed Overall Cognitive Status: History of cognitive impairments - at baseline                                          Exercises      Shoulder Instructions       General Comments      Pertinent Vitals/ Pain       Pain Assessment Pain Assessment: No/denies pain  Home Living  Prior Functioning/Environment              Frequency  Min 1X/week        Progress Toward Goals  OT Goals(current goals can now be found in the care plan section)  Progress towards OT goals: Progressing toward goals  Acute Rehab OT Goals OT Goal Formulation: With patient Time For Goal Achievement: 08/15/23 Potential to Achieve Goals: Fair  Plan Frequency remains appropriate;Discharge plan needs to be updated    Co-evaluation                 AM-PAC OT "6 Clicks" Daily Activity     Outcome Measure   Help from another person eating meals?: A Little Help from another person taking care of personal grooming?: A Little Help from another person toileting, which includes using toliet, bedpan, or urinal?: A Little Help from another person bathing (including washing, rinsing, drying)?: A Lot Help from another person to  put on and taking off regular upper body clothing?: A Lot Help from another person to put on and taking off regular lower body clothing?: A Lot 6 Click Score: 15    End of Session Equipment Utilized During Treatment: Gait belt;Rolling walker (2 wheels)  OT Visit Diagnosis: Unsteadiness on feet (R26.81);Other abnormalities of gait and mobility (R26.89);History of falling (Z91.81);Muscle weakness (generalized) (M62.81)   Activity Tolerance Patient tolerated treatment well   Patient Left in chair;with call bell/phone within reach;with chair alarm set   Nurse Communication          Time: 4782-9562 OT Time Calculation (min): 20 min  Charges: OT General Charges $OT Visit: 1 Visit OT Treatments $Self Care/Home Management : 8-22 mins  Berna Spare, OTR/L Acute Rehabilitation Services Office: [phone]   Evern Bio 08/03/2023, 12:30 PM

## 2023-08-03 NOTE — Assessment & Plan Note (Signed)
Not evident that there has been much change since spring.  He continues to follow with neurology.

## 2023-08-03 NOTE — TOC Progression Note (Addendum)
Transition of Care [facility]) - Progression Note    Patient Details  Name: Kent Flowers MRN: [id number] Date of Birth: 1946-11-18  Transition of Care [facility]) CM/SW Contact  Carley Hammed, LCSW Phone Number: 08/03/2023, 1:34 PM  Clinical Narrative:     Authorization is still pending at this time. Family aware. TOC will continue to follow for DC needs.   3:20 [name] can take pt over the weekend if [name] is approved. DNR on chart. Family and medical team updated to barriers. TOC will continue to follow for DC needs.   Expected Discharge Plan: IP Rehab Facility Barriers to Discharge: Insurance Authorization  Expected Discharge Plan and Services       Living arrangements for the past 2 months: Independent Living Facility, Single Family Home                                       Social Determinants of Health (SDOH) Interventions SDOH Screenings   Food Insecurity: No Food Insecurity (07/22/2023)  Housing: Low Risk  (07/22/2023)  Transportation Needs: No Transportation Needs (07/22/2023)  Utilities: Not At Risk (07/22/2023)  Alcohol Screen: Low Risk  (03/02/2020)  Depression (PHQ2-9): Low Risk  (03/16/2021)  Financial Resource Strain: Low Risk  (03/02/2020)  Physical Activity: Inactive (03/02/2020)  Social Connections: Unknown (05/02/2022)   Received from [facility]  Stress: Stress Concern Present (03/02/2020)  Tobacco Use: Medium Risk (07/13/2023)    Readmission Risk Interventions     No data to display

## 2023-08-03 NOTE — Progress Notes (Signed)
Physical Therapy Treatment Patient Details Name: Kent Flowers MRN: [id number] DOB: September 10, 1946 Today's Date: 08/03/2023   History of Present Illness 77 y.o. male admitted on 07/13/23 for falls, acute hpoxemia, sepsis, and elevated troponin.  Pt dx with UTI (with hematuria) and possible PNA.  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  Pt with significant PMH of falls, COPD, thoracic laminectomy, L shoulder RTC repair and decompression (2004).    PT Comments  Pt is up in chair and agreeable to ambulation. Pt requires modA for scooting hips to edge of recliner but once there he is able to power up with contact guard A. Pt making good progress towards his goals and is able to improve ambulation distance. Pt with increased fatigue with ambulation and request to get back to bed for a nap. Pt requires modA for returning LE to bed. D/c plans remain appropriate at this time. PT will continue to follow acutely.     If plan is discharge home, recommend the following: Assistance with cooking/housework;Assist for transportation;Help with stairs or ramp for entrance;A lot of help with walking and/or transfers;Supervision due to cognitive status;A lot of help with bathing/dressing/bathroom   Can travel by private vehicle     Yes  Equipment Recommendations  None recommended by PT    Recommendations for Other Services       Precautions / Restrictions Precautions Precautions: Fall;Other (comment) Precaution Comments: monitor O2 sats, ambulatory O2 may be needed Restrictions Weight Bearing Restrictions: No     Mobility  Bed Mobility Overal bed mobility: Needs Assistance Bed Mobility: Sit to Supine       Sit to supine: Mod assist   General bed mobility comments: modA for returning LE to bed and centering trunk and hips,    Transfers Overall transfer level: Needs assistance Equipment used: Rolling  walker (2 wheels) Transfers: Sit to/from Stand, Bed to chair/wheelchair/BSC Sit to Stand: Mod assist           General transfer comment: requires modA for pad scooting hips toward edge of recliner, once there only contact guard assist to power up to standing    Ambulation/Gait Ambulation/Gait assistance: Contact guard assist Gait Distance (Feet): 50 Feet Assistive device: Rolling walker (2 wheels) Gait Pattern/deviations: Step-through pattern, Decreased step length - right, Decreased stride length, Shuffle, Decreased dorsiflexion - right, Decreased dorsiflexion - left, Trunk flexed Gait velocity: decr     General Gait Details: contact guard for safety, pt unsure of his endurance, provided close chair follow but is able to self regulate and walk in hallway and turn around and go back with multiple standing rest breaks       Balance Overall balance assessment: Needs assistance Sitting-balance support: Feet supported, Bilateral upper extremity supported Sitting balance-Leahy Scale: Fair Sitting balance - Comments: Seated balance activity to facilitate anterior weight shifting. (see exercises)   Standing balance support: During functional activity, Reliant on assistive device for balance, Bilateral upper extremity supported Standing balance-Leahy Scale: Poor Standing balance comment: dependent on UE                            Cognition Arousal: Alert Behavior During Therapy: WFL for tasks assessed/performed Overall Cognitive Status: History of cognitive impairments - at baseline  General Comments General comments (skin integrity, edema, etc.): VSS on RA      Pertinent Vitals/Pain Pain Assessment Pain Assessment: No/denies pain     PT Goals (current goals can now be found in the care plan section) Acute Rehab PT Goals Patient Stated Goal: to go to rehab and get stronger PT Goal Formulation: With  patient/family Time For Goal Achievement: 07/28/23 Potential to Achieve Goals: Good Progress towards PT goals: Progressing toward goals    Frequency    Min 1X/week      PT Plan Current plan remains appropriate       AM-PAC PT "6 Clicks" Mobility   Outcome Measure  Help needed turning from your back to your side while in a flat bed without using bedrails?: A Little Help needed moving from lying on your back to sitting on the side of a flat bed without using bedrails?: A Little Help needed moving to and from a bed to a chair (including a wheelchair)?: A Little Help needed standing up from a chair using your arms (e.g., wheelchair or bedside chair)?: A Little Help needed to walk in hospital room?: A Little Help needed climbing 3-5 steps with a railing? : Total 6 Click Score: 16    End of Session Equipment Utilized During Treatment: Gait belt Activity Tolerance: Patient tolerated treatment well Patient left: with call bell/phone within reach;in bed;with bed alarm set Nurse Communication: Mobility status PT Visit Diagnosis: Muscle weakness (generalized) (M62.81);Difficulty in walking, not elsewhere classified (R26.2);Pain     Time: 1430-1502 PT Time Calculation (min) (ACUTE ONLY): 32 min  Charges:    $Gait Training: 23-37 mins PT General Charges $$ ACUTE PT VISIT: 1 Visit                     Aleyda Gindlesperger B. Beverely Risen PT, DPT Acute Rehabilitation Services Please use secure chat or  Call Office [phone]    Elon Alas [facility] 08/03/2023, 3:06 PM

## 2023-08-03 NOTE — Assessment & Plan Note (Addendum)
-   CIR appeal denied, decided on SNF in family meeting yesterday  - PT/OT following - Awaiting second appeal as original auth & first appeal was denied - family to tour SNFs and decide this week, reached out to [facility] to see if there has been an update

## 2023-08-03 NOTE — Progress Notes (Signed)
     Daily Progress Note Intern Pager: [phone]  Patient name: Kent Flowers [facility] Medical record number: [id number] Date of birth: [birth year] Age: 77 y.o. Gender: male  Primary Care Provider: Earnest Rosier, MD Consultants: urology, signed off Code Status: DNR  Pt Overview and Major Events to Date:  7/29- admitted 7/30- accepted to CIR, pending insurance auth 8/1- void trial  8/2- insurance denied CIR, pending appeal 8/7- appeal denied, family filing maximus appeal   Assessment and Plan:  77yo male who presented initially for COPD exacerbation and gross hematuria. Pertinent PMH/PSH includes DISH of c-spine chronic pain management, COPD not on oxygen. [name] but pending placement.  [facility]     * (Principal) Recurrent falls     - CIR appeal denied, decided on SNF in family meeting yesterday  - PT/OT following - Awaiting second appeal as original auth & first appeal was denied - family to tour SNFs and decide this week, reached out to [facility] to see if  there has been an update       Chronic and Stable Problems:  Gross hematuria: Hgb stable, urology signed off during admission, continue flomax, f/u outpatient urology for cystoscopy and further w/u Alzheimer dementia: delirium precautions, aricept, seroquel COPD: continue home trelegy and albuterol prn Chronic pain: home oxycodone   FEN/GI: regular PPx: lovenox Dispo:SNF  , family to tour these and let us know . Barriers include placement.  Subjective:  Pt doing well this morning. Frustrated that he has not been able to reach things like the urinal. Urinal and table with pt's items on it were moved within arm's reach.   Objective: Temp:  [97.7 F (36.5 C)-98.5 F (36.9 C)] 97.8 F (36.6 C) (08/16 0802) Pulse Rate:  [60-69] 65 (08/16 0811) Resp:  [16-18] 18 (08/16 0811) BP: (123-155)/(66-83) 130/71 (08/16 0802) SpO2:  [93 %-96 %] 96 % (08/16 0811) Physical Exam: General:  NAD Cardiovascular: RRR Respiratory: coarse breath sounds Abdomen: normal bowel sounds, soft  Laboratory: Most recent CBC Lab Results  Component Value Date   WBC 11.6 (H) 07/25/2023   HGB 11.3 (L) 07/25/2023   HCT 36.4 (L) 07/25/2023   MCV 94.3 07/25/2023   PLT 366 07/25/2023   Most recent BMP    Latest Ref Rng & Units 07/21/2023   12:32 AM  BMP  Glucose 70 - 99 mg/dL 147   BUN 8 - 23 mg/dL 31   Creatinine 8.29 - 1.24 mg/dL 5.62   Sodium 130 - 865 mmol/L 135   Potassium 3.5 - 5.1 mmol/L 3.6   Chloride 98 - 111 mmol/L 101   CO2 22 - 32 mmol/L 27   Calcium 8.9 - 10.3 mg/dL 8.0      Imaging/Diagnostic Tests: No new imaging   Para March, DO 08/03/2023, 2:46 PM  PGY-1, [place] [facility] FPTS Intern pager: [phone], text pages welcome Secure chat group [facility] Teaching Service

## 2023-08-03 NOTE — Assessment & Plan Note (Signed)
Mild nonspecific exacerbation.  We think his current medications will be sufficient. Plan-use nebulizer more and avoid using rescue inhaler more than 4 times daily.

## 2023-08-03 NOTE — Care Management Important Message (Signed)
Important Message  Patient Details  Name: Kent Flowers MRN: [id number] Date of Birth: June 09, 1946   Medicare Important Message Given:  Yes     Sherilyn Banker 08/03/2023, 2:33 PM

## 2023-08-04 DIAGNOSIS — R296 Repeated falls: Secondary | ICD-10-CM | POA: Diagnosis not present

## 2023-08-04 LAB — GLUCOSE, CAPILLARY
Glucose-Capillary: 121 mg/dL — ABNORMAL HIGH (ref 70–99)
Glucose-Capillary: 98 mg/dL (ref 70–99)

## 2023-08-04 NOTE — Plan of Care (Signed)
  Problem: Education: Goal: Knowledge of General Education information will improve Description Including pain rating scale, medication(s)/side effects and non-pharmacologic comfort measures Outcome: Progressing   Problem: Nutrition: Goal: Adequate nutrition will be maintained Outcome: Progressing   Problem: Pain Managment: Goal: General experience of comfort will improve Outcome: Progressing

## 2023-08-04 NOTE — Progress Notes (Addendum)
     Daily Progress Note Intern Pager: [phone]  Patient name: Kent Flowers [facility] Medical record number: [id number] Date of birth: 06/09/1946 Age: 77 y.o. Gender: male  Primary Care Provider: Earnest Rosier, MD Consultants: Urology signed off  Code Status: DNR   Pt Overview and Major Events to Date:  7/29- admitted 7/30- accepted to CIR, pending insurance auth 8/1- void trial  8/2- insurance denied CIR, pending appeal 8/7- appeal denied, family filing maximus appea  Assessment and Plan:  Kent Flowers is a 77 y.o. male who presented initially for COPD exacerbation and gross hematuria. Pertinent PMH/PSH includes DISH of c-spine chronic pain management, COPD not on oxygen. TB gold test results negative. Patients chronic neck pain at baseline. [name], awaiting SNF placement.  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 Barriers include SNF placement .   Subjective:  Patient doing well this morning. Has chronic neck pain and discomfort with bed positioning. No additional complaints of fevers, chills, chest pain, nausea or vomiting.   Objective: Temp:  [97.8 F (36.6 C)-98.4 F (36.9 C)] 98.4 F (36.9 C) (08/17  0325) Pulse Rate:  [60-65] 60 (08/17 0325) Resp:  [16-20] 19 (08/17 0325) BP: (118-130)/(59-73) 128/59 (08/17 0325) SpO2:  [93 %-96 %] 94 % (08/17 0325) Physical Exam: General: NAD, conversational  Cardiovascular: RRR, no murmurs appreciated Respiratory: coarse breath sounds Abdomen: Soft, non tender, non distended Extremities: No lower extremity edema   Laboratory: Most recent CBC Lab Results  Component Value Date   WBC 11.6 (H) 07/25/2023   HGB 11.3 (L) 07/25/2023   HCT 36.4 (L) 07/25/2023   MCV 94.3 07/25/2023   PLT 366 07/25/2023   Most recent BMP    Latest Ref Rng & Units 07/21/2023   12:32 AM  BMP  Glucose 70 - 99 mg/dL 401   BUN 8 - 23 mg/dL 31   Creatinine 0.27 - 1.24 mg/dL 2.53   Sodium 664 - 403 mmol/L 135   Potassium 3.5 - 5.1 mmol/L 3.6   Chloride 98 - 111 mmol/L 101   CO2 22 - 32 mmol/L 27   Calcium 8.9 - 10.3 mg/dL 8.0      Imaging/Diagnostic Tests:  No new images obtained   [name], MD 08/04/2023, 6:44 AM  PGY-1, [place] [facility] FPTS Intern pager: [phone], text pages welcome Secure chat group [facility] Teaching Service

## 2023-08-05 DIAGNOSIS — R296 Repeated falls: Secondary | ICD-10-CM | POA: Diagnosis not present

## 2023-08-05 NOTE — Assessment & Plan Note (Addendum)
At baseline 

## 2023-08-05 NOTE — Assessment & Plan Note (Signed)
-   Family decided on SNF  - PT/OT following - Awaiting second appeal as original auth & first appeal was denied - family to tour SNFs and decide this week, reached out to [facility] to see if there has been an update

## 2023-08-05 NOTE — Progress Notes (Signed)
     Daily Progress Note Intern Pager: [phone]  Patient name: Kent Flowers [facility] Medical record number: [id number] Date of birth: 06/30/1946 Age: 77 y.o. Gender: male  Primary Care Provider: Earnest Rosier, MD Consultants: Urology signed off  Code Status: DNR    Pt Overview and Major Events to Date:  7/29- admitted 7/30- accepted to CIR, pending insurance auth 8/2- insurance denied CIR, pending appeal 8/7- appeal denied, family filing 2nd appeal   Assessment and Plan:   Kent Flowers is a 77 y.o. male who presented initially for COPD exacerbation and gross hematuria. Pertinent PMH/PSH includes DISH of c-spine, chronic pain management, COPD not on oxygen. Hematuria--Urology s/o with outpatient follow up and COPD controlled after treatment of exacerbation. [name], awaiting SNF placement.   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 Barriers include SNF placement  Subjective:  Chronic neck pain, otherwise no complaints.   Objective: Temp:  [97.6 F (36.4 C)-98.2 F (36.8 C)] 97.6 F (36.4 C) (08/18 0452) Pulse Rate:  [60-78] 60 (08/18 0452) Resp:  [18] 18 (08/18 0452) BP: (122-150)/(65-82) 150/75 (08/18 0452) SpO2:  [93 %-95 %] 95 % (08/18 0452) General:  Alert in no apparent distress Heart: Regular rate and rhythm with no murmurs appreciated Lungs: Normal WOB Skin: Warm and dry   Laboratory: Most recent CBC Lab Results  Component Value Date   WBC 11.6 (H) 07/25/2023   HGB 11.3 (L) 07/25/2023   HCT 36.4 (L) 07/25/2023   MCV 94.3 07/25/2023   PLT 366 07/25/2023   Most recent BMP    Latest Ref Rng & Units 07/21/2023   12:32 AM  BMP  Glucose 70 - 99 mg/dL 147   BUN 8 - 23 mg/dL 31   Creatinine 8.29 - 1.24 mg/dL 5.62   Sodium 130 - 865 mmol/L 135   Potassium 3.5 - 5.1 mmol/L 3.6   Chloride 98 - 111 mmol/L 101   CO2 22 - 32 mmol/L 27   Calcium 8.9 - 10.3 mg/dL 8.0     Alfredo Martinez, MD 08/05/2023, 5:45 AM  PGY-3, [place] [facility] FPTS Intern pager: [phone], text pages welcome Secure chat group [facility] Teaching Service

## 2023-08-05 NOTE — Assessment & Plan Note (Signed)
Assistance with meals

## 2023-08-05 NOTE — TOC Progression Note (Signed)
Transition of Care [facility]) - Progression Note    Patient Details  Name: Kent Flowers MRN: [id number] Date of Birth: 11/19/1946  Transition of [facility]) CM/SW Contact  Jimmy Picket, [place] Phone Number: 08/05/2023, 12:01 PM  Clinical Narrative:     Pts [name] has been approved [id number], next review date 8/19.   [name] and sent text message requesting a call back.   Expected Discharge Plan: IP Rehab Facility Barriers to Discharge: Insurance Authorization  Expected Discharge Plan and Services       Living arrangements for the past 2 months: Independent Living Facility, Single Family Home                                       Social Determinants of Health (SDOH) Interventions SDOH Screenings   Food Insecurity: No Food Insecurity (07/22/2023)  Housing: Low Risk  (07/22/2023)  Transportation Needs: No Transportation Needs (07/22/2023)  Utilities: Not At Risk (07/22/2023)  Alcohol Screen: Low Risk  (03/02/2020)  Depression (PHQ2-9): Low Risk  (03/16/2021)  Financial Resource Strain: Low Risk  (03/02/2020)  Physical Activity: Inactive (03/02/2020)  Social Connections: Unknown (05/02/2022)   Received from [facility]  Stress: Stress Concern Present (03/02/2020)  Tobacco Use: Medium Risk (08/03/2023)    Readmission Risk Interventions     No data to display

## 2023-08-06 DIAGNOSIS — R296 Repeated falls: Secondary | ICD-10-CM | POA: Diagnosis not present

## 2023-08-06 NOTE — Assessment & Plan Note (Signed)
Assistance with meals

## 2023-08-06 NOTE — Assessment & Plan Note (Addendum)
-   Family decided on SNF  - PT/OT following - Awaiting second appeal as original auth & first appeal was denied -  Case worker attempted to call [name] this weekend, no answer by facility, next review day today
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
## 2023-08-06 NOTE — Assessment & Plan Note (Signed)
At baseline 

## 2023-08-06 NOTE — Progress Notes (Signed)
Physical Therapy Treatment Patient Details Name: Kent Flowers MRN: [id number] DOB: 1946-01-19 Today's Date: 08/06/2023   History of Present Illness 77 y.o. male admitted on 07/13/23 for falls, acute hpoxemia, sepsis, and elevated troponin.  Pt dx with UTI (with hematuria) and possible PNA.  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  Pt with significant PMH of falls, COPD, thoracic laminectomy, L shoulder RTC repair and decompression (2004).    PT Comments  Pt is sitting up in bed , found to have spilled urinal in bed and gown and sheets soaked. Pt provided mod A for bed mobility and sit to stand. Pt begins pivot to recliner with modA but has posterior loss of balance requiring total A for landing in chair. Pt reports apprehension and disappointment about not being able to walk as far as he did last week. Provided encouragement and had him stand again with modA and ambulate forward with modA with close chair follow. Pt able to sit with more control, and reports that is enough walking for today. D/c plans remain appropriate. PT will continue to follow acutely.    If plan is discharge home, recommend the following: Assistance with cooking/housework;Assist for transportation;Help with stairs or ramp for entrance;A lot of help with walking and/or transfers;Supervision due to cognitive status;A lot of help with bathing/dressing/bathroom   Can travel by private vehicle     Yes  Equipment Recommendations  None recommended by PT    Recommendations for Other Services       Precautions / Restrictions Precautions Precautions: Fall;Other (comment) Precaution Comments: monitor O2 sats, ambulatory O2 may be needed Restrictions Weight Bearing Restrictions: No     Mobility  Bed Mobility Overal bed mobility: Needs Assistance Bed Mobility: Sit to Supine     Supine to sit: Mod assist, HOB elevated, Used  rails     General bed mobility comments: requries modA for bringing hips to EoB and trunk to upright    Transfers Overall transfer level: Needs assistance Equipment used: Rolling walker (2 wheels) Transfers: Sit to/from Stand, Bed to chair/wheelchair/BSC Sit to Stand: Mod assist   Step pivot transfers: Total assist       General transfer comment: pt able to come to standing with modA , begins pivot from bed to chair and has loss of balance backwards requiring total A to insure hips turn and make it into the chair. have pt stand again from chair with modA    Ambulation/Gait Ambulation/Gait assistance: Mod assist Gait Distance (Feet): 8 Feet Assistive device: Rolling walker (2 wheels) Gait Pattern/deviations: Decreased step length - right, Decreased stride length, Shuffle, Decreased dorsiflexion - right, Decreased dorsiflexion - left, Trunk flexed, Step-to pattern Gait velocity: decr     General Gait Details: modA for steadying with RW, increased posterior lean so close chair follow provided. pt not comfortable with walking due to increased fear of falling, PT insists to prove pt is capable of walking with assistance         Balance Overall balance assessment: Needs assistance Sitting-balance support: Feet supported, Bilateral upper extremity supported Sitting balance-Leahy Scale: Fair Sitting balance - Comments: Seated balance activity to facilitate anterior weight shifting. (see exercises)   Standing balance support: During functional activity, Reliant on assistive device for balance, Bilateral upper extremity supported Standing balance-Leahy Scale: Poor Standing balance comment: dependent on UE  Cognition Arousal: Alert Behavior During Therapy: WFL for tasks assessed/performed Overall Cognitive Status: History of cognitive impairments - at baseline                                 General Comments: difficulty with  word finding           General Comments General comments (skin integrity, edema, etc.): pt with increased wheezing and need for inhaler use for ambulation      Pertinent Vitals/Pain Pain Assessment Pain Assessment: Faces Pain Location: neck Pain Descriptors / Indicators: Aching, Grimacing, Guarding, Discomfort Pain Intervention(s): Limited activity within patient's tolerance, Monitored during session, Repositioned     PT Goals (current goals can now be found in the care plan section) Acute Rehab PT Goals Patient Stated Goal: to go to rehab and get stronger PT Goal Formulation: With patient/family Time For Goal Achievement: 07/28/23 Potential to Achieve Goals: Good Progress towards PT goals: Progressing toward goals    Frequency    Min 1X/week      PT Plan Current plan remains appropriate       AM-PAC PT "6 Clicks" Mobility   Outcome Measure  Help needed turning from your back to your side while in a flat bed without using bedrails?: A Little Help needed moving from lying on your back to sitting on the side of a flat bed without using bedrails?: A Little Help needed moving to and from a bed to a chair (including a wheelchair)?: A Little Help needed standing up from a chair using your arms (e.g., wheelchair or bedside chair)?: A Little Help needed to walk in hospital room?: A Little Help needed climbing 3-5 steps with a railing? : Total 6 Click Score: 16    End of Session Equipment Utilized During Treatment: Gait belt Activity Tolerance: Patient tolerated treatment well Patient left: with call bell/phone within reach;in bed;with bed alarm set Nurse Communication: Mobility status PT Visit Diagnosis: Muscle weakness (generalized) (M62.81);Difficulty in walking, not elsewhere classified (R26.2);Pain     Time: 1610-9604 PT Time Calculation (min) (ACUTE ONLY): 25 min  Charges:    $Gait Training: 8-22 mins $Therapeutic Activity: 8-22 mins PT General Charges $$  ACUTE PT VISIT: 1 Visit                     Miklos Bidinger B. Beverely Risen PT, DPT Acute Rehabilitation Services Please use secure chat or  Call Office [phone]    Elon Alas [facility] 08/06/2023, 5:17 PM

## 2023-08-06 NOTE — TOC Progression Note (Signed)
Transition of Care [facility]) - Progression Note    Patient Details  Name: ZECHARIAH STINSON MRN: [id number] Date of Birth: 1946-05-20  Transition of Care [facility]) CM/SW Contact  Carley Hammed, LCSW Phone Number: 08/06/2023, 2:05 PM  Clinical Narrative:     CSW called and messaged [name] several times requesting an update. CSW followed up with [facility] who noted that [name] is still pending, [name] confirmed this. [employer] notes they have everything they need, it is just still in process. CSW updated family who is understandable frustrated with how long the insurance is taking. CSW updated Supervisor to barriers. TOC will continue to follow for DC needs.   Expected Discharge Plan: IP Rehab Facility Barriers to Discharge: Insurance Authorization  Expected Discharge Plan and Services       Living arrangements for the past 2 months: Independent Living Facility, Single Family Home                                       Social Determinants of Health (SDOH) Interventions SDOH Screenings   Food Insecurity: No Food Insecurity (07/22/2023)  Housing: Low Risk  (07/22/2023)  Transportation Needs: No Transportation Needs (07/22/2023)  Utilities: Not At Risk (07/22/2023)  Alcohol Screen: Low Risk  (03/02/2020)  Depression (PHQ2-9): Low Risk  (03/16/2021)  Financial Resource Strain: Low Risk  (03/02/2020)  Physical Activity: Inactive (03/02/2020)  Social Connections: Unknown (05/02/2022)   Received from [facility], [facility]  Stress: Stress Concern Present (03/02/2020)  Tobacco Use: Medium Risk (08/03/2023)    Readmission Risk Interventions     No data to display

## 2023-08-06 NOTE — Plan of Care (Signed)

## 2023-08-06 NOTE — Progress Notes (Addendum)
     Daily Progress Note Intern Pager: [phone]  Patient name: Kent Flowers Medical record number: [id number] Date of birth: Dec 12, 1946 Age: 77 y.o. Gender: male  Primary Care Provider: Earnest Rosier, MD Consultants: Urology signed off  Code Status: DNR  Pt Overview and Major Events to Date:  7/29- admitted 7/30- accepted to CIR, pending insurance auth 8/2- insurance denied CIR, pending appeal 8/7- appeal denied, family filing 2nd appeal   Assessment and Plan:   Kent Flowers is a 77 y.o. male who presented initially for COPD exacerbation and gross hematuria. Pertinent PMH/PSH includes DISH of c-spine, chronic pain management, COPD not on oxygen. Hematuria--Urology s/o with outpatient follow up and COPD controlled after treatment of exacerbation. Patient is medically stable for discharge. Awaiting approval for placement, follow-up with case worker about IP rehab acceptance.  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 Barriers include bed availability.   Subjective:  Chronic neck pain. No further complaints this morning.   Objective: Temp:  [98.1 F (36.7 C)-98.9 F (37.2 C)] 98.6 F (37 C) (08/19 0738) Pulse Rate:  [56-75] 56 (08/19 0738) Resp:  [16-18] 16 (08/18 1606) BP: (118-126)/(59-65) 123/65  (08/19 0738) SpO2:  [91 %-96 %] 92 % (08/19 0738) Physical Exam: General: No acute distress,  Cardiovascular: RRR, no murmurs appreciated Respiratory: NWOB Abdomen: Non tender, non distended  Laboratory: Most recent CBC Lab Results  Component Value Date   WBC 11.6 (H) 07/25/2023   HGB 11.3 (L) 07/25/2023   HCT 36.4 (L) 07/25/2023   MCV 94.3 07/25/2023   PLT 366 07/25/2023   Most recent BMP    Latest Ref Rng & Units 07/21/2023   12:32 AM  BMP  Glucose 70 - 99 mg/dL 865   BUN 8 - 23 mg/dL 31   Creatinine 7.84 - 1.24 mg/dL 6.96   Sodium 295 - 284 mmol/L 135   Potassium 3.5 - 5.1 mmol/L 3.6   Chloride 98 - 111 mmol/L 101   CO2 22 - 32 mmol/L 27   Calcium 8.9 - 10.3 mg/dL 8.0    Other pertinent labs None    Imaging/Diagnostic Tests: Radiologist Impression: None  My interpretation: None  Kent Ao, MD 08/06/2023, 8:55 AM  PGY-1, [facility] FPTS Intern pager: [phone], text pages welcome Secure chat group [facility] Flowers [facility]

## 2023-08-07 MED ORDER — FOLIC ACID 1 MG PO TABS: 1.0000 mg | ORAL_TABLET | Freq: Every day | ORAL | Status: DC

## 2023-08-07 MED ORDER — OXYCODONE HCL 10 MG PO TABS: ORAL_TABLET | ORAL | Status: DC

## 2023-08-07 MED ORDER — POLYETHYLENE GLYCOL 3350 17 G PO PACK: 17.0000 g | PACK | Freq: Every day | ORAL | Status: DC

## 2023-08-07 MED ORDER — TAMSULOSIN HCL 0.4 MG PO CAPS: 0.8000 mg | ORAL_CAPSULE | Freq: Every day | ORAL | Status: DC

## 2023-08-07 MED ORDER — VITAMIN B-1 100 MG PO TABS: 100.0000 mg | ORAL_TABLET | Freq: Every day | ORAL | Status: DC

## 2023-08-07 MED ORDER — ACETAMINOPHEN 325 MG PO TABS: 650.0000 mg | ORAL_TABLET | Freq: Four times a day (QID) | ORAL | Status: AC

## 2023-08-07 MED ORDER — ADULT MULTIVITAMIN W/MINERALS CH: 1.0000 | ORAL_TABLET | Freq: Every day | ORAL | Status: DC

## 2023-08-07 MED ORDER — WHITE PETROLATUM EX OINT: 1.0000 | TOPICAL_OINTMENT | Freq: Every day | CUTANEOUS | Status: DC

## 2023-08-07 MED ORDER — LIDOCAINE 5 % EX PTCH: 1.0000 | MEDICATED_PATCH | CUTANEOUS | Status: DC

## 2023-08-07 NOTE — Discharge Summary (Cosign Needed Addendum)
Family Medicine Teaching [facility] Discharge Summary  Patient name: Kent Flowers Medical record number: [id number] Date of birth: Jul 05, 1946 Age: 77 y.o. 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  Per family patient has been getting UTIs every few months to be managed outpatient.  He met sepsis criteria for leukocytosis and tachycardia with suspected urinary source. Urine culture obtained prior to Abx was ultimately negative. Patient was started on Rocephin 1 g for 3 days. Additionally, he received 4 days of cefdinir 300mg . Patient received bladder scans which showed >400 mL urinary retention so he had foley placed on 7/28. Passed voiding trial on 8/1. Flomax was increased from 0.4 mg to 0.8 mg daily. Urology was consulted prior to discharge and recommended outpatient follow up for hematuria including cystoscopy and further work up.   Neck pain s/p fall Patient presents status post fall without loss of consciousness. CT without acute injury but did  demonstrate chronic dens fracture and DISH of the C-spine. Patient given Lidocaine patches. Patient was continued on home Oxy 15 mg QID qith 10 mg at bedtime prn.   Acute on chronic respiratory failure with hypoxia Patient with history of COPD followed by Dr. Maple Hudson. On presentation he had significant wheezing but was stable on room air. CTPE negative for PE but did show increased bronchial infection/inflammation with increased atelectasis or infiltrates in the lower lobes compared with prior. Blood cultures remained negative. Patient given home trelegy and albuterol nebs. Patient given 3 days of Azithromycin (7/27 - 7/29) and 4 days prednisone (7/27-7/30). Ambulatory O2 test revealed no oxygen requirement and patient was discharged without oxygen.   Recurrent Falls  Frequent falls most likely due to poor balance in the setting of reduced neck range of motion.  Patient also on several medications that could predispose to falls - Attempts were made to wean oxycodone, however patient could not tolerate pain and was returned to home oxycodone dose as above. Patient was seen by PT and OT who recommended CIR with ultimate discharge to ALF.  Insurance denied CIR and patient was ultimately transferred to SNF.  Other chronic conditions were medically managed with home medications and formulary alternatives as necessary. COPD - Trelegy, albuterol every 4 hours prn  Bilateral LE Edema - compression stockings Alcohol use - thiamine, folate supplementation  PCP Follow-up Recommendations: Close Urology follow up for hematuria, likely needs cystoscopy Patient on aspirin per neurology for microvascular changes on MRI, consider adding statin  Monitor medications and adjust for fall risk; consider weaning oxycodone if patient can tolerate CBC and BMP at follow up  Disposition: SNF  Discharge Condition: Stable   Discharge Exam:  Vitals:   08/07/23 0756 08/07/23 0847  BP: 134/63   Pulse: 61 79  Resp: 17 18   Temp:    SpO2: 93% 92%   Physical Exam: General: No acute distress,  Cardiovascular: RRR, no murmurs appreciated Respiratory: NWOB on room air Abdomen: Non tender, non distende  Significant Procedures: None   Significant Labs and Imaging:  No results for input(s): "WBC", "HGB", "HCT", "PLT" in the last 48 hours. No results for input(s): "NA", "K", "CL", "CO2", "GLUCOSE", "BUN", "CREATININE", "CALCIUM", "MG", "PHOS", "ALKPHOS", "AST", "ALT", "ALBUMIN", "PROTEIN" in the last 48 hours.  Pertinent Imaging  Refer to hospital course above Results/Tests Pending at Time of Discharge: None   Discharge Medications:  Allergies as of 08/07/2023       Reactions   Ceclor [cefaclor] Rash   Tolerates ceftriaxone and cefazolin   Sulfa Antibiotics Other (See Comments), Rash   SEVERE RASH- childhood allergy SEVERE RASH, Childhood allergy, Other reaction(s): Rash, SEVERE RASH- childhood allergy        Medication List     STOP taking these medications    nitrofurantoin (macrocrystal-monohydrate) 100 MG capsule Commonly known as: MACROBID       TAKE these medications    acetaminophen 325 MG tablet Commonly known as: TYLENOL Take 2 tablets (650 mg total) by mouth every 6 (six) hours. What changed: when to take this   albuterol 108 (90 Base) MCG/ACT inhaler Commonly known as: VENTOLIN HFA Inhale 2 puffs into the lungs every 6 (six) hours as needed for wheezing or shortness of breath.   aspirin EC 81 MG tablet Take 1 tablet (81 mg total) by mouth daily. Swallow whole.   cyanocobalamin 1000 MCG tablet Commonly known as: VITAMIN B12 Take 1 tablet (1,000 mcg total) by mouth daily.   donepezil 10 MG tablet Commonly known as: ARICEPT TAKE 1 TABLET BY MOUTH AT BEDTIME   folic acid 1 MG tablet Commonly known as: FOLVITE Take 1 tablet (1 mg total) by mouth daily. Start taking on: August 08, 2023   lidocaine 5 % Commonly known as: LIDODERM Place 1 patch onto the skin daily.  Remove & Discard patch within 12 hours or as directed by MD   multivitamin with minerals Tabs tablet Take 1 tablet by mouth daily. Start taking on: August 08, 2023   Oxycodone HCl 10 MG Tabs May take 1.5 tablets (15 mg total) by mouth every 6 (six) hours as needed for severe pain. May also take 1 tablet (10 mg total) at bedtime as needed for severe pain. What changed:  medication strength See the new instructions.   polyethylene glycol 17 g packet Commonly known as: MIRALAX / GLYCOLAX Take 17 g by mouth daily. Start taking on: August 08, 2023   QUEtiapine 25 MG tablet Commonly known as: SEROQUEL Take 1 tablet (25 mg total) by mouth 2 (two) times daily.   tamsulosin 0.4 MG Caps capsule Commonly known as: FLOMAX Take 2 capsules (0.8 mg total) by mouth at bedtime. What changed: how much to take   thiamine 100 MG tablet Commonly known as: Vitamin B-1 Take 1 tablet (100 mg total) by mouth daily. Start taking on: August 08, 2023   Trelegy Ellipta 200-62.5-25 MCG/ACT Aepb Generic drug: Fluticasone-Umeclidin-Vilant Inhale 1 puff into the lungs daily.   white petrolatum Oint Commonly known as: VASELINE Apply 1 Application topically daily. Start taking on: August 08, 2023       Discharge Instructions: Please refer to  Patient Instructions section of EMR for full details.  Patient was counseled important signs and symptoms that should prompt return to medical care, changes in medications, dietary instructions, activity restrictions, and follow up appointments.   [name], MD 08/07/2023, 2:12 PM PGY-1, [facility] Family Medicine   I was personally present and performed medical decision making activities of this service and have verified that the service and findings are accurately documented in the resident's note.  Shelby Mattocks, DO                  08/07/2023, 2:12 PM

## 2023-08-07 NOTE — Progress Notes (Signed)
Had conversation with patient attempting to clarify that his Oxi IR 15mg  is 4 times daily Q8 hrs he thinks that it is scheduled Q 4hrs. Several times I tried to explain to him however he insist that I am mistaking and that he is to receive it Q4hrs. H is requesting that the MD order it medication Q4hrs

## 2023-08-07 NOTE — Plan of Care (Signed)
  Problem: Clinical Measurements: Goal: Ability to maintain clinical measurements within normal limits will improve Outcome: Progressing Goal: Will remain free from infection Outcome: Progressing Goal: Diagnostic test results will improve Outcome: Progressing Goal: Respiratory complications will improve Outcome: Progressing Goal: Cardiovascular complication will be avoided Outcome: Progressing   Problem: Activity: Goal: Risk for activity intolerance will decrease Outcome: Progressing   Problem: Nutrition: Goal: Adequate nutrition will be maintained Outcome: Progressing   Problem: Coping: Goal: Level of anxiety will decrease Outcome: Progressing   Problem: Elimination: Goal: Will not experience complications related to bowel motility Outcome: Progressing Goal: Will not experience complications related to urinary retention Outcome: Progressing   Problem: Pain Managment: Goal: General experience of comfort will improve Outcome: Progressing   

## 2023-08-07 NOTE — Plan of Care (Signed)

## 2023-08-07 NOTE — TOC Transition Note (Signed)
Transition of Care [facility]) - CM/SW Discharge Note   Patient Details  Name: Kent Flowers MRN: [id number] Date of Birth: [birth year]  Transition of Care [facility]) CM/SW Contact:  Carley Hammed, LCSW Phone Number: 08/07/2023, 10:15 AM   Clinical Narrative:     Pt to be transported to [facility] via [place]. Nurse to call report to [phone]  Final next level of care: Skilled Nursing Facility Barriers to Discharge: Barriers Resolved   Patient Goals and CMS Choice      Discharge Placement                Patient chooses bed at: [facility] Patient to be transferred to facility by: [facility] Name of family member notified: Gladstone Pih Patient and family notified of of transfer: 08/07/23  Discharge Plan and Services Additional resources added to the After Visit Summary for                                       Social Determinants of Health (SDOH) Interventions SDOH Screenings   Food Insecurity: No Food Insecurity (07/22/2023)  Housing: Low Risk  (07/22/2023)  Transportation Needs: No Transportation Needs (07/22/2023)  Utilities: Not At Risk (07/22/2023)  Alcohol Screen: Low Risk  (03/02/2020)  Depression (PHQ2-9): Low Risk  (03/16/2021)  Financial Resource Strain: Low Risk  (03/02/2020)  Physical Activity: Inactive (03/02/2020)  Social Connections: Unknown (05/02/2022)   Received from [facility], [facility]  Stress: Stress Concern Present (03/02/2020)  Tobacco Use: Medium Risk (08/03/2023)     Readmission Risk Interventions     No data to display

## 2023-08-07 NOTE — Care Management Important Message (Signed)
Important Message  Patient Details  Name: WALID LAMBERTI MRN: [id number] Date of Birth: 27-Apr-1946   Medicare Important Message Given:  Yes     Sherilyn Banker 08/07/2023, 1:38 PM

## 2023-08-31 ENCOUNTER — Other Ambulatory Visit: Payer: Self-pay | Admitting: Neurology

## 2023-09-05 ENCOUNTER — Other Ambulatory Visit: Payer: Self-pay | Admitting: Neurology

## 2023-09-05 NOTE — Telephone Encounter (Signed)
no

## 2024-01-08 ENCOUNTER — Ambulatory Visit: Payer: Medicare Other | Admitting: Internal Medicine

## 2024-05-08 ENCOUNTER — Encounter: Payer: Self-pay | Admitting: Neurology

## 2024-05-08 ENCOUNTER — Ambulatory Visit: Payer: Medicare Other | Admitting: Neurology
